# Patient Record
Sex: Male | Born: 1940
Health system: Southern US, Community
[De-identification: ages and names within clinical notes are randomized; demographics above are authoritative.]

## PROBLEM LIST (undated history)

## (undated) DIAGNOSIS — R0600 Dyspnea, unspecified: Secondary | ICD-10-CM

## (undated) DIAGNOSIS — I2510 Atherosclerotic heart disease of native coronary artery without angina pectoris: Secondary | ICD-10-CM

## (undated) DIAGNOSIS — J310 Chronic rhinitis: Secondary | ICD-10-CM

## (undated) DIAGNOSIS — E785 Hyperlipidemia, unspecified: Secondary | ICD-10-CM

## (undated) DIAGNOSIS — E669 Obesity, unspecified: Secondary | ICD-10-CM

## (undated) DIAGNOSIS — I351 Nonrheumatic aortic (valve) insufficiency: Secondary | ICD-10-CM

## (undated) DIAGNOSIS — K648 Other hemorrhoids: Secondary | ICD-10-CM

## (undated) DIAGNOSIS — Z87442 Personal history of urinary calculi: Secondary | ICD-10-CM

## (undated) DIAGNOSIS — IMO0002 Reserved for concepts with insufficient information to code with codable children: Secondary | ICD-10-CM

## (undated) DIAGNOSIS — L989 Disorder of the skin and subcutaneous tissue, unspecified: Secondary | ICD-10-CM

## (undated) DIAGNOSIS — G4733 Obstructive sleep apnea (adult) (pediatric): Secondary | ICD-10-CM

## (undated) DIAGNOSIS — J329 Chronic sinusitis, unspecified: Secondary | ICD-10-CM

## (undated) DIAGNOSIS — R609 Edema, unspecified: Secondary | ICD-10-CM

## (undated) DIAGNOSIS — L309 Dermatitis, unspecified: Secondary | ICD-10-CM

## (undated) DIAGNOSIS — I272 Pulmonary hypertension, unspecified: Secondary | ICD-10-CM

## (undated) DIAGNOSIS — I10 Essential (primary) hypertension: Secondary | ICD-10-CM

## (undated) DIAGNOSIS — I5189 Other ill-defined heart diseases: Secondary | ICD-10-CM

## (undated) DIAGNOSIS — I071 Rheumatic tricuspid insufficiency: Secondary | ICD-10-CM

## (undated) DIAGNOSIS — L0390 Cellulitis, unspecified: Secondary | ICD-10-CM

## (undated) DIAGNOSIS — Z86010 Personal history of colonic polyps: Secondary | ICD-10-CM

## (undated) DIAGNOSIS — K5790 Diverticulosis of intestine, part unspecified, without perforation or abscess without bleeding: Secondary | ICD-10-CM

## (undated) DIAGNOSIS — K760 Fatty (change of) liver, not elsewhere classified: Secondary | ICD-10-CM

## (undated) DIAGNOSIS — M1990 Unspecified osteoarthritis, unspecified site: Secondary | ICD-10-CM

## (undated) DIAGNOSIS — R0609 Other forms of dyspnea: Secondary | ICD-10-CM

## (undated) HISTORY — DX: Personal history of colonic polyps: Z86.010

## (undated) HISTORY — DX: Obesity, unspecified: E66.9

## (undated) HISTORY — DX: Disorder of the skin and subcutaneous tissue, unspecified: L98.9

## (undated) HISTORY — DX: Edema, unspecified: R60.9

## (undated) HISTORY — DX: Obstructive sleep apnea (adult) (pediatric): G47.33

## (undated) HISTORY — DX: Unspecified osteoarthritis, unspecified site: M19.90

## (undated) HISTORY — DX: Reserved for concepts with insufficient information to code with codable children: IMO0002

## (undated) HISTORY — DX: Chronic rhinitis: J31.0

## (undated) HISTORY — DX: Chronic sinusitis, unspecified: J32.9

## (undated) HISTORY — PX: POLYPECTOMY: SHX149

## (undated) HISTORY — DX: Essential (primary) hypertension: I10

## (undated) HISTORY — PX: COLONOSCOPY: SHX174

## (undated) HISTORY — DX: Dermatitis, unspecified: L30.9

## (undated) HISTORY — DX: Atherosclerotic heart disease of native coronary artery without angina pectoris: I25.10

## (undated) HISTORY — DX: Hyperlipidemia, unspecified: E78.5

## (undated) HISTORY — DX: Cellulitis, unspecified: L03.90

## (undated) HISTORY — PX: CARDIAC CATHETERIZATION: SHX172

## (undated) HISTORY — DX: Personal history of urinary calculi: Z87.442

## (undated) HISTORY — PX: CORONARY STENT PLACEMENT: SHX1402

## (undated) HISTORY — PX: TONSILLECTOMY: SUR1361

---

## 2001-10-14 ENCOUNTER — Emergency Department (HOSPITAL_COMMUNITY): Admission: EM | Admit: 2001-10-14 | Discharge: 2001-10-14 | Payer: Self-pay | Admitting: Emergency Medicine

## 2001-10-14 ENCOUNTER — Encounter: Payer: Self-pay | Admitting: Emergency Medicine

## 2002-05-23 ENCOUNTER — Encounter: Payer: Self-pay | Admitting: Emergency Medicine

## 2002-05-23 ENCOUNTER — Emergency Department (HOSPITAL_COMMUNITY): Admission: EM | Admit: 2002-05-23 | Discharge: 2002-05-23 | Payer: Self-pay | Admitting: Emergency Medicine

## 2002-05-23 DIAGNOSIS — Z87442 Personal history of urinary calculi: Secondary | ICD-10-CM

## 2002-05-23 HISTORY — DX: Personal history of urinary calculi: Z87.442

## 2003-06-14 ENCOUNTER — Encounter: Payer: Self-pay | Admitting: Cardiology

## 2003-06-14 ENCOUNTER — Ambulatory Visit (HOSPITAL_COMMUNITY): Admission: RE | Admit: 2003-06-14 | Discharge: 2003-06-15 | Payer: Self-pay | Admitting: Cardiology

## 2004-09-05 ENCOUNTER — Ambulatory Visit: Payer: Self-pay | Admitting: Internal Medicine

## 2004-09-10 ENCOUNTER — Ambulatory Visit: Payer: Self-pay | Admitting: Internal Medicine

## 2004-09-18 ENCOUNTER — Ambulatory Visit (HOSPITAL_COMMUNITY): Admission: RE | Admit: 2004-09-18 | Discharge: 2004-09-18 | Payer: Self-pay | Admitting: Internal Medicine

## 2004-09-18 DIAGNOSIS — K760 Fatty (change of) liver, not elsewhere classified: Secondary | ICD-10-CM

## 2004-09-18 HISTORY — DX: Fatty (change of) liver, not elsewhere classified: K76.0

## 2004-09-24 ENCOUNTER — Ambulatory Visit: Payer: Self-pay | Admitting: Pulmonary Disease

## 2004-10-01 ENCOUNTER — Ambulatory Visit (HOSPITAL_BASED_OUTPATIENT_CLINIC_OR_DEPARTMENT_OTHER): Admission: RE | Admit: 2004-10-01 | Discharge: 2004-10-01 | Payer: Self-pay | Admitting: Pulmonary Disease

## 2004-10-01 ENCOUNTER — Ambulatory Visit: Payer: Self-pay | Admitting: Pulmonary Disease

## 2004-10-23 ENCOUNTER — Ambulatory Visit: Payer: Self-pay | Admitting: Internal Medicine

## 2004-11-04 ENCOUNTER — Ambulatory Visit: Payer: Self-pay | Admitting: Internal Medicine

## 2004-11-14 ENCOUNTER — Ambulatory Visit: Payer: Self-pay | Admitting: Pulmonary Disease

## 2004-11-23 ENCOUNTER — Emergency Department (HOSPITAL_COMMUNITY): Admission: EM | Admit: 2004-11-23 | Discharge: 2004-11-23 | Payer: Self-pay | Admitting: Emergency Medicine

## 2004-12-04 ENCOUNTER — Emergency Department (HOSPITAL_COMMUNITY): Admission: EM | Admit: 2004-12-04 | Discharge: 2004-12-04 | Payer: Self-pay | Admitting: Emergency Medicine

## 2004-12-20 ENCOUNTER — Ambulatory Visit: Payer: Self-pay | Admitting: Pulmonary Disease

## 2004-12-23 ENCOUNTER — Ambulatory Visit: Payer: Self-pay | Admitting: Cardiology

## 2004-12-24 ENCOUNTER — Ambulatory Visit (HOSPITAL_BASED_OUTPATIENT_CLINIC_OR_DEPARTMENT_OTHER): Admission: RE | Admit: 2004-12-24 | Discharge: 2004-12-24 | Payer: Self-pay | Admitting: Pulmonary Disease

## 2004-12-31 ENCOUNTER — Ambulatory Visit: Payer: Self-pay | Admitting: Pulmonary Disease

## 2005-10-20 ENCOUNTER — Ambulatory Visit: Payer: Self-pay | Admitting: Internal Medicine

## 2005-10-28 ENCOUNTER — Ambulatory Visit: Payer: Self-pay | Admitting: Internal Medicine

## 2005-10-28 LAB — CONVERTED CEMR LAB: PSA: 1.59 ng/mL

## 2005-11-25 ENCOUNTER — Ambulatory Visit: Payer: Self-pay | Admitting: Internal Medicine

## 2006-06-01 ENCOUNTER — Ambulatory Visit: Payer: Self-pay | Admitting: Internal Medicine

## 2006-06-15 ENCOUNTER — Ambulatory Visit: Payer: Self-pay | Admitting: Internal Medicine

## 2006-10-30 ENCOUNTER — Ambulatory Visit: Payer: Self-pay | Admitting: Cardiology

## 2007-03-23 ENCOUNTER — Encounter: Payer: Self-pay | Admitting: Internal Medicine

## 2007-03-23 DIAGNOSIS — Z860100 Personal history of colon polyps, unspecified: Secondary | ICD-10-CM | POA: Insufficient documentation

## 2007-03-23 DIAGNOSIS — E785 Hyperlipidemia, unspecified: Secondary | ICD-10-CM

## 2007-03-23 DIAGNOSIS — E1165 Type 2 diabetes mellitus with hyperglycemia: Secondary | ICD-10-CM | POA: Insufficient documentation

## 2007-03-23 DIAGNOSIS — I10 Essential (primary) hypertension: Secondary | ICD-10-CM | POA: Insufficient documentation

## 2007-07-20 ENCOUNTER — Encounter: Payer: Self-pay | Admitting: Internal Medicine

## 2007-09-07 ENCOUNTER — Encounter: Payer: Self-pay | Admitting: Internal Medicine

## 2007-09-23 ENCOUNTER — Ambulatory Visit: Payer: Self-pay | Admitting: Internal Medicine

## 2007-09-23 DIAGNOSIS — L989 Disorder of the skin and subcutaneous tissue, unspecified: Secondary | ICD-10-CM | POA: Insufficient documentation

## 2007-09-23 DIAGNOSIS — H8110 Benign paroxysmal vertigo, unspecified ear: Secondary | ICD-10-CM | POA: Insufficient documentation

## 2007-09-28 ENCOUNTER — Encounter: Payer: Self-pay | Admitting: Internal Medicine

## 2007-10-01 ENCOUNTER — Ambulatory Visit: Payer: Self-pay | Admitting: Internal Medicine

## 2007-10-01 LAB — CONVERTED CEMR LAB
ALT: 88 units/L — ABNORMAL HIGH (ref 0–53)
AST: 52 units/L — ABNORMAL HIGH (ref 0–37)
BUN: 15 mg/dL (ref 6–23)
CO2: 30 meq/L (ref 19–32)
Calcium: 9.1 mg/dL (ref 8.4–10.5)
Chloride: 105 meq/L (ref 96–112)
Cholesterol: 111 mg/dL (ref 0–200)
Creatinine, Ser: 0.9 mg/dL (ref 0.4–1.5)
Creatinine,U: 96.1 mg/dL
GFR calc Af Amer: 109 mL/min
GFR calc non Af Amer: 90 mL/min
Glucose, Bld: 133 mg/dL — ABNORMAL HIGH (ref 70–99)
HDL: 29.6 mg/dL — ABNORMAL LOW (ref >39.0)
Hgb A1c MFr Bld: 6.9 % — ABNORMAL HIGH (ref 4.6–6.0)
LDL Cholesterol: 60 mg/dL (ref 0–99)
Microalb Creat Ratio: 20.8 mg/g (ref 0.0–30.0)
Microalb, Ur: 2 mg/dL — ABNORMAL HIGH (ref 0.0–1.9)
Potassium: 4.5 meq/L (ref 3.5–5.1)
Sodium: 141 meq/L (ref 135–145)
TSH: 1.49 microintl units/mL (ref 0.35–5.50)
Total CHOL/HDL Ratio: 3.8
Triglycerides: 107 mg/dL (ref 0–149)
VLDL: 21 mg/dL (ref 0–40)

## 2007-10-04 ENCOUNTER — Encounter: Payer: Self-pay | Admitting: Internal Medicine

## 2007-10-05 ENCOUNTER — Encounter: Payer: Self-pay | Admitting: Internal Medicine

## 2007-10-05 ENCOUNTER — Encounter: Admission: RE | Admit: 2007-10-05 | Discharge: 2007-10-19 | Payer: Self-pay | Admitting: Internal Medicine

## 2007-10-26 ENCOUNTER — Ambulatory Visit: Payer: Self-pay | Admitting: Cardiology

## 2007-10-29 ENCOUNTER — Encounter: Payer: Self-pay | Admitting: Internal Medicine

## 2007-11-18 ENCOUNTER — Ambulatory Visit: Payer: Self-pay | Admitting: Internal Medicine

## 2007-12-31 ENCOUNTER — Ambulatory Visit: Payer: Self-pay | Admitting: Internal Medicine

## 2007-12-31 LAB — CONVERTED CEMR LAB
Creatinine, Ser: 1.1 mg/dL (ref 0.4–1.5)
Potassium: 4.3 meq/L (ref 3.5–5.1)

## 2008-01-03 ENCOUNTER — Telehealth: Payer: Self-pay | Admitting: Internal Medicine

## 2008-03-07 ENCOUNTER — Ambulatory Visit: Payer: Self-pay | Admitting: Internal Medicine

## 2008-03-14 ENCOUNTER — Telehealth: Payer: Self-pay | Admitting: Internal Medicine

## 2008-05-09 ENCOUNTER — Ambulatory Visit: Payer: Self-pay | Admitting: Internal Medicine

## 2008-05-09 DIAGNOSIS — J018 Other acute sinusitis: Secondary | ICD-10-CM | POA: Insufficient documentation

## 2008-05-09 DIAGNOSIS — L259 Unspecified contact dermatitis, unspecified cause: Secondary | ICD-10-CM | POA: Insufficient documentation

## 2008-05-12 ENCOUNTER — Telehealth: Payer: Self-pay | Admitting: Internal Medicine

## 2008-05-17 ENCOUNTER — Ambulatory Visit: Payer: Self-pay | Admitting: Internal Medicine

## 2008-05-17 LAB — CONVERTED CEMR LAB
ALT: 68 units/L — ABNORMAL HIGH (ref 0–53)
AST: 49 units/L — ABNORMAL HIGH (ref 0–37)
BUN: 14 mg/dL (ref 6–23)
CO2: 30 meq/L (ref 19–32)
Calcium: 8.9 mg/dL (ref 8.4–10.5)
Chloride: 110 meq/L (ref 96–112)
Cholesterol: 113 mg/dL (ref 0–200)
Creatinine, Ser: 0.7 mg/dL (ref 0.4–1.5)
Creatinine,U: 108.9 mg/dL
GFR calc Af Amer: 146 mL/min
GFR calc non Af Amer: 121 mL/min
Glucose, Bld: 147 mg/dL — ABNORMAL HIGH (ref 70–99)
HDL: 27.5 mg/dL — ABNORMAL LOW (ref >39.0)
Hgb A1c MFr Bld: 7.2 % — ABNORMAL HIGH (ref 4.6–6.0)
LDL Cholesterol: 56 mg/dL (ref 0–99)
Microalb Creat Ratio: 5.5 mg/g (ref 0.0–30.0)
Microalb, Ur: 0.6 mg/dL (ref 0.0–1.9)
Potassium: 4.2 meq/L (ref 3.5–5.1)
Sodium: 142 meq/L (ref 135–145)
Total CHOL/HDL Ratio: 4.1
Triglycerides: 150 mg/dL — ABNORMAL HIGH (ref 0–149)
VLDL: 30 mg/dL (ref 0–40)

## 2008-05-23 ENCOUNTER — Ambulatory Visit: Payer: Self-pay | Admitting: Internal Medicine

## 2008-05-23 DIAGNOSIS — L0291 Cutaneous abscess, unspecified: Secondary | ICD-10-CM

## 2008-05-23 DIAGNOSIS — L0390 Cellulitis, unspecified: Secondary | ICD-10-CM

## 2008-06-08 ENCOUNTER — Encounter: Payer: Self-pay | Admitting: Internal Medicine

## 2008-06-29 ENCOUNTER — Ambulatory Visit: Payer: Self-pay | Admitting: Internal Medicine

## 2008-07-31 ENCOUNTER — Encounter: Payer: Self-pay | Admitting: Internal Medicine

## 2008-08-10 ENCOUNTER — Encounter: Payer: Self-pay | Admitting: Internal Medicine

## 2008-08-11 ENCOUNTER — Encounter: Payer: Self-pay | Admitting: Internal Medicine

## 2008-08-28 ENCOUNTER — Encounter: Payer: Self-pay | Admitting: Internal Medicine

## 2008-09-26 ENCOUNTER — Encounter: Payer: Self-pay | Admitting: Internal Medicine

## 2008-10-27 ENCOUNTER — Ambulatory Visit: Payer: Self-pay | Admitting: Cardiology

## 2008-10-27 LAB — CONVERTED CEMR LAB: TSH: 1.96 microintl units/mL (ref 0.35–5.50)

## 2008-11-01 ENCOUNTER — Ambulatory Visit: Payer: Self-pay | Admitting: Cardiology

## 2008-11-07 ENCOUNTER — Encounter: Payer: Self-pay | Admitting: Internal Medicine

## 2008-12-21 ENCOUNTER — Ambulatory Visit: Payer: Self-pay | Admitting: Internal Medicine

## 2009-01-03 ENCOUNTER — Encounter: Payer: Self-pay | Admitting: Internal Medicine

## 2009-02-15 ENCOUNTER — Ambulatory Visit: Payer: Self-pay | Admitting: Internal Medicine

## 2009-02-15 LAB — CONVERTED CEMR LAB
BUN: 16 mg/dL (ref 6–23)
Chloride: 106 meq/L (ref 96–112)
Cholesterol: 137 mg/dL (ref 0–200)
Glucose, Bld: 115 mg/dL — ABNORMAL HIGH (ref 70–99)
HDL: 30.6 mg/dL — ABNORMAL LOW (ref >39.00)
Microalb Creat Ratio: 5.4 mg/g (ref 0.0–30.0)
Potassium: 4.2 meq/L (ref 3.5–5.1)
Triglycerides: 116 mg/dL (ref 0.0–149.0)
VLDL: 23.2 mg/dL (ref 0.0–40.0)

## 2009-02-22 ENCOUNTER — Ambulatory Visit: Payer: Self-pay | Admitting: Internal Medicine

## 2009-02-27 ENCOUNTER — Telehealth: Payer: Self-pay | Admitting: Internal Medicine

## 2009-03-16 ENCOUNTER — Telehealth: Payer: Self-pay | Admitting: Internal Medicine

## 2009-03-21 ENCOUNTER — Telehealth: Payer: Self-pay | Admitting: Internal Medicine

## 2009-04-17 ENCOUNTER — Telehealth: Payer: Self-pay | Admitting: Internal Medicine

## 2009-05-03 ENCOUNTER — Ambulatory Visit: Payer: Self-pay | Admitting: Internal Medicine

## 2009-05-03 DIAGNOSIS — G4733 Obstructive sleep apnea (adult) (pediatric): Secondary | ICD-10-CM

## 2009-07-24 ENCOUNTER — Telehealth: Payer: Self-pay | Admitting: Internal Medicine

## 2009-07-27 ENCOUNTER — Ambulatory Visit: Payer: Self-pay | Admitting: Internal Medicine

## 2009-07-27 LAB — CONVERTED CEMR LAB
ALT: 76 units/L — ABNORMAL HIGH (ref 0–53)
AST: 46 units/L — ABNORMAL HIGH (ref 0–37)
CO2: 32 meq/L (ref 19–32)
Calcium: 8.9 mg/dL (ref 8.4–10.5)
Chloride: 104 meq/L (ref 96–112)
Hgb A1c MFr Bld: 6.4 % (ref 4.6–6.5)
Sodium: 142 meq/L (ref 135–145)

## 2009-08-03 ENCOUNTER — Ambulatory Visit: Payer: Self-pay | Admitting: Internal Medicine

## 2009-08-03 DIAGNOSIS — R609 Edema, unspecified: Secondary | ICD-10-CM

## 2009-08-16 ENCOUNTER — Telehealth: Payer: Self-pay | Admitting: Family

## 2009-09-21 ENCOUNTER — Ambulatory Visit: Payer: Self-pay | Admitting: Internal Medicine

## 2009-09-25 ENCOUNTER — Telehealth: Payer: Self-pay | Admitting: Internal Medicine

## 2009-09-26 ENCOUNTER — Ambulatory Visit: Payer: Self-pay | Admitting: Internal Medicine

## 2009-09-26 LAB — CONVERTED CEMR LAB
Chloride: 104 meq/L (ref 96–112)
Creatinine, Ser: 1.1 mg/dL (ref 0.4–1.5)
GFR calc non Af Amer: 71.15 mL/min (ref >60)
Potassium: 4.2 meq/L (ref 3.5–5.1)
Pro B Natriuretic peptide (BNP): 12 pg/mL (ref 0.0–100.0)

## 2009-10-05 ENCOUNTER — Ambulatory Visit: Payer: Self-pay | Admitting: Internal Medicine

## 2009-10-22 DIAGNOSIS — I2510 Atherosclerotic heart disease of native coronary artery without angina pectoris: Secondary | ICD-10-CM | POA: Insufficient documentation

## 2009-10-26 ENCOUNTER — Ambulatory Visit: Payer: Self-pay | Admitting: Cardiology

## 2009-12-17 ENCOUNTER — Telehealth: Payer: Self-pay | Admitting: Internal Medicine

## 2010-01-25 ENCOUNTER — Ambulatory Visit: Payer: Self-pay | Admitting: Internal Medicine

## 2010-01-28 LAB — CONVERTED CEMR LAB
ALT: 92 units/L — ABNORMAL HIGH (ref 0–53)
AST: 69 units/L — ABNORMAL HIGH (ref 0–37)
CO2: 28 meq/L (ref 19–32)
Calcium: 8.6 mg/dL (ref 8.4–10.5)
Creatinine,U: 221.3 mg/dL
GFR calc non Af Amer: 132.79 mL/min (ref >60)
Hgb A1c MFr Bld: 6.8 % — ABNORMAL HIGH (ref 4.6–6.5)
Microalb, Ur: 1.3 mg/dL (ref 0.0–1.9)
Sodium: 143 meq/L (ref 135–145)
Total CHOL/HDL Ratio: 5
Triglycerides: 127 mg/dL (ref 0.0–149.0)

## 2010-02-01 ENCOUNTER — Ambulatory Visit: Payer: Self-pay | Admitting: Internal Medicine

## 2010-04-08 ENCOUNTER — Encounter: Payer: Self-pay | Admitting: Internal Medicine

## 2010-05-28 ENCOUNTER — Telehealth: Payer: Self-pay | Admitting: Internal Medicine

## 2010-05-29 ENCOUNTER — Ambulatory Visit: Payer: Self-pay | Admitting: Internal Medicine

## 2010-05-29 LAB — CONVERTED CEMR LAB
ALT: 90 units/L — ABNORMAL HIGH (ref 0–53)
AST: 56 units/L — ABNORMAL HIGH (ref 0–37)
Calcium: 9.1 mg/dL (ref 8.4–10.5)
GFR calc non Af Amer: 123.69 mL/min (ref >60)
Hgb A1c MFr Bld: 7.5 % — ABNORMAL HIGH (ref 4.6–6.5)
Potassium: 4.8 meq/L (ref 3.5–5.1)
Sodium: 144 meq/L (ref 135–145)

## 2010-06-04 ENCOUNTER — Ambulatory Visit: Payer: Self-pay | Admitting: Internal Medicine

## 2010-06-20 ENCOUNTER — Telehealth: Payer: Self-pay | Admitting: Internal Medicine

## 2010-07-30 ENCOUNTER — Telehealth: Payer: Self-pay | Admitting: Internal Medicine

## 2010-07-30 ENCOUNTER — Ambulatory Visit: Payer: Self-pay | Admitting: Internal Medicine

## 2010-07-30 LAB — CONVERTED CEMR LAB
Alkaline Phosphatase: 59 units/L (ref 39–117)
Bilirubin, Direct: 0.1 mg/dL (ref 0.0–0.3)
Calcium: 9 mg/dL (ref 8.4–10.5)
Creatinine, Ser: 0.7 mg/dL (ref 0.4–1.5)
GFR calc non Af Amer: 127.96 mL/min (ref >60.00)
Sodium: 143 meq/L (ref 135–145)

## 2010-08-06 ENCOUNTER — Ambulatory Visit: Payer: Self-pay | Admitting: Internal Medicine

## 2010-08-23 ENCOUNTER — Encounter: Payer: Self-pay | Admitting: Internal Medicine

## 2010-08-28 ENCOUNTER — Telehealth: Payer: Self-pay | Admitting: Internal Medicine

## 2010-09-26 ENCOUNTER — Encounter: Payer: Self-pay | Admitting: Internal Medicine

## 2010-09-26 NOTE — Progress Notes (Signed)
Summary: Rx Status  Phone Note Outgoing Call   Call placed by: Glendell Docker CMA,  December 17, 2009 8:53 AM Call placed to: Patient Summary of Call: call placed to patient at [phone] regarding rx refill request, no answer, voice message left for patient to return call Initial call taken by: Glendell Docker CMA,  December 17, 2009 8:53 AM  Follow-up for Phone Call        call placed to patient at [phone], no answer, routed through privacy director, unable to leave message  Follow-up by: Glendell Docker CMA,  December 18, 2009 12:19 PM  Additional Follow-up for Phone Call Additional follow up Details #1::        unable to reach patient,msg left with pharmacy to have patient contact office regarding rx Additional Follow-up by: Glendell Docker CMA,  December 19, 2009 8:12 AM

## 2010-09-26 NOTE — Assessment & Plan Note (Signed)
Summary: 2 MONTH FOLLO WUP/MHF   Vital Signs:  Patient profile:   70 year old male Weight:      308.50 pounds BMI:     41.99 O2 Sat:      94 % on Room air Temp:     97.8 degrees F oral Pulse rate:   74 / minute Pulse rhythm:   regular Resp:     22 per minute BP sitting:   110 / 70  (right arm) Cuff size:   large  Vitals Entered By: Glendell Docker CMA (October 05, 2009 4:04 PM)  O2 Flow:  Room air  Primary Care Provider:  D. Thomos Lemons DO  CC:  2 Month Follow up.  History of Present Illness: 2 Month follow up  70 y/o white male for follow up  cough is better but he has unresolved sinus drainage  DM II using byetta - he feels like Alma Friendly was better at curbing his appetite low blood sugar 130 high 150 avg 140's  poor dietary compliance  LE swelling - about the same.  BNP is normal.  no exercise  Allergies (verified): No Known Drug Allergies  Past History:  Past Medical History: Colonic polyps, hx of Coronary artery disease Diabetes mellitus, type II Hyperlipidemia    Hypertension   Postional Vertigo  History of thermal injury to right upper back with chronic scar tissue.    Past Surgical History: Cardiac Catherization       Family History: Father and sister with heart disease Mother - cancer          Social History: Occupation: Musician business Married Former Smoker            Physical Exam  General:  alert and overweight-appearing.   Lungs:  normal respiratory effort and normal breath sounds.   Heart:  normal rate, regular rhythm, and no gallop.   Extremities:  1+ left pedal edema and 1+ right pedal edema.     Impression & Recommendations:  Problem # 1:  DIABETES MELLITUS, TYPE II (ICD-250.00) some wt gain since prev visit.  he feels byetta now as helpful and [place].  he can not tolerate regular metformin due to diarrhea.    switch to [place] and glumetza.  pt encouraged to start walking program  The following medications were removed  from the medication list:    Byetta 10 Mcg Pen 10 Mcg/0.75ml Soln (Exenatide) ..... Inject subcutaneously two times a day His updated medication list for this problem includes:    Aspirin Low Dose 81 Mg Tabs (Aspirin) .Marland Kitchen... Take 1 tablet by mouth once a day    Januvia 100 Mg Tabs (Sitagliptin phosphate) ..... One by mouth once daily    Glumetza 500 Mg Xr24h-tab (Metformin hcl) ..... One by mouth bid  Labs Reviewed: Creat: 1.1 (09/26/2009)    Reviewed HgBA1c results: 6.4 (07/27/2009)  7.1 (02/15/2009)  Problem # 2:  RHINOSINUSITIS, ACUTE (ICD-461.8) Assessment: Improved mild residual cough.  continue nasal saline irrigation The following medications were removed from the medication list:    Cefdinir 300 Mg Caps (Cefdinir) ..... One by mouth two times a day His updated medication list for this problem includes:    Nasonex 50 Mcg/act Susp (Mometasone furoate) .Marland Kitchen... 2 sprays each nostril once daily  Problem # 3:  EDEMA (ICD-782.3) BNP.  symptoms secondary to obesity and venous insuff.  start walking program  Complete Medication List: 1)  Aspirin Low Dose 81 Mg Tabs (Aspirin) .... Take 1 tablet by mouth once  a day 2)  Simvastatin 40 Mg Tabs (Simvastatin) .... One by mouth qpm 3)  Fish Oil 1000 Mg Caps (Omega-3 fatty acids) .... Take 1 tablet by mouth once a day 4)  Nasonex 50 Mcg/act Susp (Mometasone furoate) .... 2 sprays each nostril once daily 5)  Bd Pen Needle Short U/f 31g X 8 Mm Misc (Insulin pen needle) .... Use twice daily for insulin injection 6)  Freestyle Lite Test Strp (Glucose blood) .... Test once daily 7)  Amlodipine Besylate 5 Mg Tabs (Amlodipine besylate) .... One by mouth once daily 8)  Januvia 100 Mg Tabs (Sitagliptin phosphate) .... One by mouth once daily 9)  Glumetza 500 Mg Xr24h-tab (Metformin hcl) .... One by mouth bid  Patient Instructions: 1)  Please schedule a follow-up appointment in 4 months. 2)  BMP prior to visit, ICD-9: 401.9 3)  HbgA1C prior to visit,  ICD-9: 250.00 4)  Urine Microalbumin prior to visit, ICD-9: 250.00 5)  FLP, AST, ALT  prior to visit, ICD-9: 272.4 6)  Please return for lab work one (1) week before your next appointment.  Prescriptions: GLUMETZA 500 MG XR24H-TAB (METFORMIN HCL) one by mouth bid  #60 x 3   Entered and Authorized by:   D. Thomos Lemons DO   Signed by:   D. Thomos Lemons DO on 10/05/2009   Method used:   Electronically to        [id number]* (retail)       [address]       [place], [place]  [address]       Ph: [phone]       Fax: [phone]   RxID:   [id number] JANUVIA 100 MG TABS (SITAGLIPTIN PHOSPHATE) one by mouth once daily  #30 x 5   Entered and Authorized by:   D. Thomos Lemons DO   Signed by:   D. Thomos Lemons DO on 10/05/2009   Method used:   Electronically to        [facility] [address]* (retail)       [address]       [place], [place]  [address]       Ph: [phone]       Fax: [phone]   RxID:   [id number]   Current Allergies (reviewed today): No known allergies

## 2010-09-26 NOTE — Letter (Signed)
Summary: [facility]   Imported By: Lanelle Bal 09/10/2010 09:43:56  _____________________________________________________________________  External Attachment:    Type:   Image     Comment:   External Document

## 2010-09-26 NOTE — Letter (Signed)
Summary: [facility]  [facility]   Imported By: Lanelle Bal 04/17/2010 10:09:04  _____________________________________________________________________  External Attachment:    Type:   Image     Comment:   External Document

## 2010-09-26 NOTE — Assessment & Plan Note (Signed)
Summary: 2 month fu/dt   Vital Signs:  Patient profile:   70 year old male Height:      72 inches Weight:      294.50 pounds BMI:     40.09 O2 Sat:      97 % on Room air Temp:     97.6 degrees F oral Pulse rate:   68 / minute Resp:     18 per minute BP sitting:   104 / 60  (left arm) Cuff size:   large  Vitals Entered By: Glendell Docker CMA (August 06, 2010 3:21 PM)  O2 Flow:  Room air CC: 2 Month follow up, Type 2 diabetes mellitus follow-up Is Patient Diabetic? Yes Did you bring your meter with you today? No Pain Assessment Patient in pain? no      Comments low blood sugar 121, high 135 avg unknown   Primary Care Provider:  Dondra Spry DO  CC:  2 Month follow up and Type 2 diabetes mellitus follow-up.  History of Present Illness:  Type 2 Diabetes Mellitus Follow-Up      This is a 70 year old man who presents for Type 2 diabetes mellitus follow-up.  The patient denies self managed hypoglycemia, hypoglycemia requiring help, and weight gain.  The patient denies the following symptoms: chest pain.  Since the last visit the patient reports good dietary compliance and monitoring blood glucose.  pt able to lose some wt mainly with dietary change.  feeling better  Preventive Screening-Counseling & Management  Alcohol-Tobacco     Smoking Status: quit  Allergies (verified): No Known Drug Allergies  Past History:  Past Medical History: CAD, NATIVE VESSEL (ICD-414.01) HYPERLIPIDEMIA (ICD-272.4) HYPERTENSION (ICD-401.9) EDEMA (ICD-782.3)  OBESITY, HX OF (ICD-V13.8)  SLEEP APNEA, OBSTRUCTIVE (ICD-327.23) DIABETES MELLITUS, TYPE II (ICD-250.00) CELLULITIS (ICD-682.9) DERMATITIS (ICD-692.9) RHINOSINUSITIS, ACUTE (ICD-461.8) SKIN LESION (ICD-709.9)  POSITIONAL VERTIGO (ICD-386.11) COLONIC POLYPS, HX OF (ICD-V12.72)  Past Surgical History: Cardiac Catherization         Family History: Father and sister with heart disease Mother - cancer             Social  History: Occupation: Sports administrator  Married Former Smoker     Grew up in [place] Mother was [place] Father side of family [place]   Physical Exam  General:  alert, well-developed, and well-nourished.   Neck:  supple and no masses.  no carotid bruits.   Lungs:  normal respiratory effort and normal breath sounds.   Heart:  normal rate, regular rhythm, no murmur, and no gallop.   Extremities:  trace left pedal edema and trace right pedal edema.     Impression & Recommendations:  Problem # 1:  HYPERLIPIDEMIA (ICD-272.4) Assessment Unchanged  His updated medication list for this problem includes:    Simvastatin 20 Mg Tabs (Simvastatin) ..... One by mouth once daily  Labs Reviewed: SGOT: 45 (07/30/2010)   SGPT: 70 (07/30/2010)   HDL:31.30 (01/28/2010), 30.60 (02/15/2009)  LDL:91 (01/28/2010), 83 (02/15/2009)  Chol:148 (01/28/2010), 137 (02/15/2009)  Trig:127.0 (01/28/2010), 116.0 (02/15/2009)  Problem # 2:  DIABETES MELLITUS, TYPE II (ICD-250.00) Assessment: Improved  His updated medication list for this problem includes:    Aspirin Low Dose 81 Mg Tabs (Aspirin) .Marland Kitchen... Take 1 tablet by mouth once a day    Onglyza 5 Mg Tabs (Saxagliptin hcl) .Marland Kitchen... Take 1 tablet by mouth once a day  Labs Reviewed: Creat: 0.7 (07/30/2010)    Reviewed HgBA1c results: 6.6 (07/30/2010)  7.5 (05/29/2010)  Complete Medication  List: 1)  Aspirin Low Dose 81 Mg Tabs (Aspirin) .... Take 1 tablet by mouth once a day 2)  Simvastatin 20 Mg Tabs (Simvastatin) .... One by mouth once daily 3)  Fish Oil 1000 Mg Caps (Omega-3 fatty acids) .... Take 1 tablet by mouth once a day 4)  Nasonex 50 Mcg/act Susp (Mometasone furoate) .... 2 sprays each nostril once daily 5)  Bd Pen Needle Short U/f 31g X 8 Mm Misc (Insulin pen needle) .... Use twice daily for insulin injection 6)  Freestyle Lite Test Strp (Glucose blood) .... Test once daily 7)  Amlodipine Besylate 5 Mg Tabs (Amlodipine besylate) .... One by mouth  once daily 8)  Onglyza 5 Mg Tabs (Saxagliptin hcl) .... Take 1 tablet by mouth once a day  Patient Instructions: 1)  Please schedule a follow-up appointment in 4 months. 2)  BMP prior to visit, ICD-9:  401.9 3)  Lipid Panel prior to visit, ICD-9:  272.4 4)  HbgA1C prior to visit, ICD-9: 250.00 5)  Please return for lab work one (1) week before your next appointment.  Prescriptions: ONGLYZA 5 MG TABS (SAXAGLIPTIN HCL) Take 1 tablet by mouth once a day  #30 x 5   Entered and Authorized by:   D. Thomos Lemons DO   Signed by:   D. Thomos Lemons DO on 08/06/2010   Method used:   Electronically to        [id number]* (retail)       [address]       [place], [place]  [address]       Ph: [phone]       Fax: [phone]   RxID:   [id number] SIMVASTATIN 20 MG TABS (SIMVASTATIN) one by mouth once daily  #90 x 1   Entered and Authorized by:   D. Thomos Lemons DO   Signed by:   D. Thomos Lemons DO on 08/06/2010   Method used:   Electronically to        [facility] [address]* (retail)       [address]       [place], [place]  [address]       Ph: [phone]       Fax: [phone]   RxID:   [id number]    Orders Added: 1)  Est. Patient Level III [[address]]    Current Allergies (reviewed today): No known allergies

## 2010-09-26 NOTE — Progress Notes (Signed)
Summary: Glimiperide Denial  Phone Note Refill Request Message from:  Fax from Pharmacy on September 25, 2009 1:05 PM  Refills Requested: Medication #1:  glimepiride 1 mg   Dosage confirmed as above?Dosage Confirmed   Brand Name Necessary? No   Supply Requested: 1 month   Last Refilled: 07/23/2009  Method Requested: Electronic Next Appointment Scheduled: 09-27-2009 [facility]  Initial call taken by: Roselle Locus,  September 25, 2009 1:06 PM  Follow-up for Phone Call        Rx denied because patient is no longer taking medication, pharmacist informed Follow-up by: Glendell Docker CMA,  September 25, 2009 4:30 PM

## 2010-09-26 NOTE — Progress Notes (Signed)
Summary: Simvastatin Rx  Phone Note Call from Patient Call back at [phone]   Caller: Patient Call For: D. Thomos Lemons DO Summary of Call: patient is wanting to know if he could get a rx for Simvastatin 40mg   tablets and cut the rx in half. He states that it would more cost effective for him. He would like to know if Dr Artist Pais would be wiling to provide a rx t o the [facility] [address] Initial call taken by: Glendell Docker CMA,  August 28, 2010 12:25 PM  Follow-up for Phone Call        higher dose of simvastatin will raise flag at pharm due to interaction with amlodipine. I suggest we keep same dose Follow-up by: D. Thomos Lemons DO,  August 28, 2010 12:34 PM  Additional Follow-up for Phone Call Additional follow up Details #1::        call was returned to patient at [phone], he has been advised per Dr Artist Pais instructions, and has verbalized understanding Additional Follow-up by: Glendell Docker CMA,  August 28, 2010 2:15 PM

## 2010-09-26 NOTE — Assessment & Plan Note (Signed)
Summary: 1 month follow up/mhf--Rm 3   Vital Signs:  Patient profile:   70 year old male Height:      72 inches Weight:      302 pounds BMI:     41.11 Temp:     98.0 degrees F oral Pulse rate:   66 / minute Pulse rhythm:   irregular Resp:     18 per minute BP sitting:   100 / 60  (right arm) Cuff size:   large  Vitals Entered By: Mervin Kung CMA (February 01, 2010 3:54 PM) CC: Room 3  Pt here for follow up.  High Bs--168  Low BS--140, Type 2 diabetes mellitus follow-up Is Patient Diabetic? Yes Comments Pt states ins. will not cover Glumetza.   Primary Care Provider:  Dondra Spry DO  CC:  Room 3  Pt here for follow up.  High Bs--168  Low BS--140 and Type 2 diabetes mellitus follow-up.  History of Present Illness:  Type 2 Diabetes Mellitus Follow-Up      This is a 70 year old man who presents for Type 2 diabetes mellitus follow-up.  The patient denies self managed hypoglycemia, hypoglycemia requiring help, and weight loss.  The patient denies the following symptoms: chest pain.  Since the last visit the patient reports poor dietary compliance, not exercising regularly, and monitoring blood glucose.    Allergies (verified): No Known Drug Allergies  Past History:  Past Medical History: CAD, NATIVE VESSEL (ICD-414.01) HYPERLIPIDEMIA (ICD-272.4) HYPERTENSION (ICD-401.9) EDEMA (ICD-782.3) OBESITY, HX OF (ICD-V13.8) SLEEP APNEA, OBSTRUCTIVE (ICD-327.23) DIABETES MELLITUS, TYPE II (ICD-250.00) CELLULITIS (ICD-682.9) DERMATITIS (ICD-692.9) RHINOSINUSITIS, ACUTE (ICD-461.8) SKIN LESION (ICD-709.9)  POSITIONAL VERTIGO (ICD-386.11) COLONIC POLYPS, HX OF (ICD-V12.72)  Past Surgical History: Cardiac Catherization        Family History: Father and sister with heart disease Mother - cancer           Social History: Occupation: Musician business Married Former Smoker             Physical Exam  General:  alert and overweight-appearing.   Lungs:  normal  respiratory effort and normal breath sounds.   Heart:  normal rate, regular rhythm, and no gallop.   Extremities:  trace left pedal edema and trace right pedal edema.   Neurologic:  cranial nerves II-XII intact and gait normal.     Impression & Recommendations:  Problem # 1:  DIABETES MELLITUS, TYPE II (ICD-250.00) Assessment Deteriorated He noticed higher CBG and difficulty with portion control since using januvia.  switch back to byetta.  The following medications were removed from the medication list:    Januvia 100 Mg Tabs (Sitagliptin phosphate) ..... One by mouth once daily His updated medication list for this problem includes:    Aspirin Low Dose 81 Mg Tabs (Aspirin) .Marland Kitchen... Take 1 tablet by mouth once a day    Byetta 10 Mcg Pen 10 Mcg/0.55ml Soln (Exenatide) ..... Use two times a day as directed  Labs Reviewed: Creat: 0.6 (01/28/2010)    Reviewed HgBA1c results: 6.8 (01/28/2010)  6.4 (07/27/2009)  Problem # 2:  HYPERTENSION (ICD-401.9)  His updated medication list for this problem includes:    Amlodipine Besylate 5 Mg Tabs (Amlodipine besylate) ..... One by mouth once daily  BP today: 100/60 Prior BP: 110/60 (10/26/2009)  Labs Reviewed: K+: 4.2 (01/28/2010) Creat: : 0.6 (01/28/2010)   Chol: 148 (01/28/2010)   HDL: 31.30 (01/28/2010)   LDL: 91 (01/28/2010)   TG: 127.0 (01/28/2010)  Complete Medication List: 1)  Aspirin Low Dose 81 Mg Tabs (Aspirin) .... Take 1 tablet by mouth once a day 2)  Simvastatin 40 Mg Tabs (Simvastatin) .... One by mouth qpm 3)  Fish Oil 1000 Mg Caps (Omega-3 fatty acids) .... Take 1 tablet by mouth once a day 4)  Nasonex 50 Mcg/act Susp (Mometasone furoate) .... 2 sprays each nostril once daily 5)  Bd Pen Needle Short U/f 31g X 8 Mm Misc (Insulin pen needle) .... Use twice daily for insulin injection 6)  Freestyle Lite Test Strp (Glucose blood) .... Test once daily 7)  Amlodipine Besylate 5 Mg Tabs (Amlodipine besylate) .... One by mouth once  daily 8)  Byetta 10 Mcg Pen 10 Mcg/0.57ml Soln (Exenatide) .... Use two times a day as directed  Patient Instructions: 1)  Please schedule a follow-up appointment in 4 months. 2)  BMP prior to visit, ICD-9: 250.00 3)  HbgA1C prior to visit, ICD-9: 250.00 4)  AST, ALT - 790.4 5)  Please return for lab work one (1) week before your next appointment.  Prescriptions: AMLODIPINE BESYLATE 5 MG TABS (AMLODIPINE BESYLATE) one by mouth once daily  #90 x 1   Entered and Authorized by:   D. Thomos Lemons DO   Signed by:   D. Thomos Lemons DO on 02/01/2010   Method used:   Electronically to        [id number] (retail)       [address]       [place], [place]  [address]       Ph: [phone]       Fax: [phone]   RxID:   [id number] SIMVASTATIN 40 MG TABS (SIMVASTATIN) one by mouth qpm  #90 x 1   Entered and Authorized by:   D. Thomos Lemons DO   Signed by:   D. Thomos Lemons DO on 02/01/2010   Method used:   Electronically to        [id number] (retail)       [address]       [place], [place]  [address]       Ph: [phone]       Fax: [phone]   RxID:   [id number] BYETTA 10 MCG PEN 10 MCG/0.04ML SOLN (EXENATIDE) use two times a day as directed  #1 month x 5   Entered and Authorized by:   D. Thomos Lemons DO   Signed by:   D. Thomos Lemons DO on 02/01/2010   Method used:   Electronically to        [id number] (retail)       [address]       [place], [place]  [address]       Ph: [phone]       Fax: [phone]   RxID:   [id number]   Current Allergies (reviewed today): No known allergies    Patient Consent for Use and Disclosure of Protected Health Information Patient: Jacob Bishop Willcox   Completed by: Mervin Kung CMA Date Completed: 02/01/2010

## 2010-09-26 NOTE — Assessment & Plan Note (Signed)
Summary: CAD/ANAS      Allergies Added: NKDA  Visit Type:  1 yr f/u Primary Provider:  Dondra Spry DO  CC:  edema/leg...denies any cp or sob.  History of Present Illness: [name] comes in today for evaluation and management of his coronary disease. He said remote percutaneous coronary intervention and has done remarkably well. He has not wanted to have stress test because of his finances and we've treated medically.  He is having no symptoms of angina or ischemic equivalence. He does have some mild dyspnea on exertion which has not changed. He denies orthopnea, PND or increased peripheral edema.  His blood sugars better the best control and sometime at 6.4%. LFTs are mildly elevated which is probably from his diabetes and fatty liver. Electrolytes and stable. He really likes primary care at [facility].  Current Medications (verified): 1)  Aspirin Low Dose 81 Mg  Tabs (Aspirin) .... Take 1 Tablet By Mouth Once A Day 2)  Simvastatin 40 Mg Tabs (Simvastatin) .... One By Mouth Qpm 3)  Fish Oil 1000 Mg  Caps (Omega-3 Fatty Acids) .... Take 1 Tablet By Mouth Once A Day 4)  Nasonex 50 Mcg/act Susp (Mometasone Furoate) .... 2 Sprays Each Nostril Once Daily 5)  Bd Pen Needle Short U/f 31g X 8 Mm Misc (Insulin Pen Needle) .... Use Twice Daily For Insulin Injection 6)  Freestyle Lite Test  Strp (Glucose Blood) .... Test Once Daily 7)  Amlodipine Besylate 5 Mg Tabs (Amlodipine Besylate) .... One By Mouth Once Daily 8)  Januvia 100 Mg Tabs (Sitagliptin Phosphate) .... One By Mouth Once Daily  Allergies (verified): No Known Drug Allergies  Past History:  Past Medical History: Last updated: 10/22/2009 CAD, NATIVE VESSEL (ICD-414.01) HYPERLIPIDEMIA (ICD-272.4) HYPERTENSION (ICD-401.9) EDEMA (ICD-782.3) OBESITY, HX OF (ICD-V13.8) SLEEP APNEA, OBSTRUCTIVE (ICD-327.23) DIABETES MELLITUS, TYPE II (ICD-250.00) CELLULITIS (ICD-682.9) DERMATITIS (ICD-692.9) RHINOSINUSITIS, ACUTE  (ICD-461.8) SKIN LESION (ICD-709.9) POSITIONAL VERTIGO (ICD-386.11) COLONIC POLYPS, HX OF (ICD-V12.72)  Past Surgical History: Last updated: 10/05/2009 Cardiac Catherization       Family History: Last updated: 10/05/2009 Father and sister with heart disease Mother - cancer          Social History: Last updated: 10/05/2009 Occupation: Restaurant business Married Former Smoker            Risk Factors: Smoking Status: quit (12/21/2008)  Review of Systems       negative other than history of present illness  Vital Signs:  Patient profile:   70 year old male Height:      72 inches Weight:      307 pounds BMI:     41.79 Pulse rate:   85 / minute Pulse rhythm:   irregular BP sitting:   110 / 60  (left arm) Cuff size:   large  Vitals Entered By: Jacob Bishop, CMA (October 26, 2009 3:04 PM)  Physical Exam  General:  obese.   Head:  normocephalic and atraumatic Eyes:  PERRLA/EOM intact; conjunctiva and lids normal. Neck:  Neck supple, no JVD. No masses, thyromegaly or abnormal cervical nodes. Chest Jacob Bishop:  no deformities or breast masses noted Lungs:  Clear bilaterally to auscultation and percussion. Heart:  Non-displaced PMI, chest non-tender; regular rate and rhythm, S1, S2 without murmurs, rubs or gallops. Carotid upstroke normal, no bruit. Normal abdominal aortic size, no bruits. Femorals normal pulses, no bruits. Pedals normal pulses. No edema, no varicosities. Msk:  Back normal, normal gait. Muscle strength and tone normal. Pulses:  pulses  normal in all 4 extremities Extremities:  trace left pedal edema and trace right pedal edema.   Neurologic:  Alert and oriented x 3. Skin:  Intact without lesions or rashes. Psych:  Normal affect.   EKG  Procedure date:  10/26/2009  Findings:      normal sinus rhythm, normal EKG  Impression & Recommendations:  Problem # 1:  CAD, NATIVE VESSEL (ICD-414.01) Assessment Unchanged  His updated medication list for this  problem includes:    Aspirin Low Dose 81 Mg Tabs (Aspirin) .Marland Kitchen... Take 1 tablet by mouth once a day    Amlodipine Besylate 5 Mg Tabs (Amlodipine besylate) ..... One by mouth once daily  Problem # 2:  HYPERTENSION (ICD-401.9) Assessment: Improved  His updated medication list for this problem includes:    Aspirin Low Dose 81 Mg Tabs (Aspirin) .Marland Kitchen... Take 1 tablet by mouth once a day    Amlodipine Besylate 5 Mg Tabs (Amlodipine besylate) ..... One by mouth once daily  Problem # 3:  EDEMA (ICD-782.3) Assessment: Improved  Problem # 4:  HYPERLIPIDEMIA (ICD-272.4)  His updated medication list for this problem includes:    Simvastatin 40 Mg Tabs (Simvastatin) ..... One by mouth qpm  Problem # 5:  OBESITY, HX OF (ICD-V13.8) Assessment: Unchanged  Problem # 7:  DIABETES MELLITUS, TYPE II (ICD-250.00) Assessment: Improved  The following medications were removed from the medication list:    Glumetza 500 Mg Xr24h-tab (Metformin hcl) ..... One by mouth bid His updated medication list for this problem includes:    Aspirin Low Dose 81 Mg Tabs (Aspirin) .Marland Kitchen... Take 1 tablet by mouth once a day    Januvia 100 Mg Tabs (Sitagliptin phosphate) ..... One by mouth once daily  Other Orders: EKG w/ Interpretation (93000)  Patient Instructions: 1)  Your physician recommends that you schedule a follow-up appointment in: year with dr Jacob Bishop 2)  Your physician recommends that you continue on your current medications as directed. Please refer to the Current Medication list given to you today.

## 2010-09-26 NOTE — Progress Notes (Signed)
Summary: Onglyza refill  Phone Note Refill Request Call back at [phone] Message from:  Patient on June 20, 2010 9:02 AM  Refills Requested: Medication #1:  ONGLYZA 5 MG TABS.   Dosage confirmed as above?Dosage Confirmed   Brand Name Necessary? No   Supply Requested: 1 month Pt states sample has worked well, would like Rx [facility]   Method Requested: Electronic Next Appointment Scheduled: 12.13.11 Initial call taken by: Lannette Donath,  June 20, 2010 9:03 AM  Follow-up for Phone Call        calll returned to patient he states the medication is working well, he took his last sample yesterday and would like to continue with the medication. He states that he is taking medication once per day. Rx completed in Dr. Tiajuana Amass Follow-up by: Glendell Docker CMA,  June 20, 2010 11:10 AM    New/Updated Medications: ONGLYZA 5 MG TABS (SAXAGLIPTIN HCL) Take 1 tablet by mouth once a day Prescriptions: ONGLYZA 5 MG TABS (SAXAGLIPTIN HCL) Take 1 tablet by mouth once a day  #30 x 3   Entered by:   Glendell Docker CMA   Authorized by:   D. Thomos Lemons DO   Signed by:   Glendell Docker CMA on 06/20/2010   Method used:   Electronically to        [facility] # [address] (retail)       [address]       [place], [place]  [address]       Ph: [phone]       Fax: [phone]   RxID:   [id number]

## 2010-09-26 NOTE — Progress Notes (Signed)
Summary: PT IN LAB NOW, NEEDS LAB ORDER  Phone Note From Other Clinic   Caller: Vicky at [facility] [phone] Summary of Call: Received call from [facility] stating pt is there for labs and they need an order. Please advise what labs pt needs to have. Nicki Guadalajara Fergerson CMA Duncan Dull)  July 30, 2010 8:23 AM   Follow-up for Phone Call        Per Dr Artist Pais, pt needs BMP, HgbA1c 250.02; LFTs 790.4. Order called to [facility] and placed in IDX. Nicki Guadalajara Fergerson CMA Duncan Dull)  July 30, 2010 8:45 AM

## 2010-09-26 NOTE — Progress Notes (Signed)
Summary: Lab orders  Phone Note Call from Patient   Caller: Patient Details for Reason: Labs Summary of Call: Pt call   going for labs at [facility] tomorrow     Please send Lab order Initial call taken by: Darral Dash,  May 28, 2010 9:24 AM  Follow-up for Phone Call        lab orders from June 10th office visit  have been entered for Jacob Bishop for 05/29/2010  Follow-up by: Glendell Docker CMA,  May 28, 2010 9:46 AM

## 2010-09-26 NOTE — Assessment & Plan Note (Signed)
Summary: ? Sinus, runny nose, congestion, H/A- jr   Vital Signs:  Patient profile:   70 year old male Weight:      309.75 pounds BMI:     42.16 O2 Sat:      96 % on Room air Temp:     98.2 degrees F Pulse rate:   109 / minute Pulse rhythm:   regular Resp:     20 per minute BP sitting:   130 / 60  (right arm) Cuff size:   large  Vitals Entered By: Glendell Docker CMA (September 21, 2009 10:02 AM)  O2 Flow:  Room air  Primary Care Provider:  D. Thomos Lemons DO  CC:  URI symptoms.  History of Present Illness:  URI Symptoms      This is a 70 year old man who presents with URI symptoms.  The patient reports nasal congestion, clear nasal discharge, purulent nasal discharge, productive cough, and earache.  Associated symptoms include fever, stiff neck, dyspnea, wheezing, and use of an antipyretic.  The patient denies vomiting and diarrhea.  The patient also reports itchy watery eyes, sneezing, headache, muscle aches, and severe fatigue.    Htn - could not tolerate Azor.   medication made him feel hyper.  he resumed amlodipine  Allergies (verified): No Known Drug Allergies  Past History:  Past Medical History: Colonic polyps, hx of Coronary artery disease Diabetes mellitus, type II Hyperlipidemia    Hypertension   Postional Vertigo History of thermal injury to right upper back with chronic scar tissue.    Past Surgical History: Cardiac Catherization      Family History: Father and sister with heart disease Mother - cancer         Social History: Occupation: Musician business Married Former Smoker           Physical Exam  General:  alert and overweight-appearing.   Ears:  R ear normal and L ear normal.   Mouth:  pharyngeal erythema.   Lungs:  normal respiratory effort, normal breath sounds, no crackles, and no wheezes.   Heart:  normal rate, regular rhythm, and no gallop.     Impression & Recommendations:  Problem # 1:  RHINOSINUSITIS, ACUTE  (ICD-461.8)  His updated medication list for this problem includes:    Nasonex 50 Mcg/act Susp (Mometasone furoate) .Marland Kitchen... 2 sprays each nostril once daily    Cefdinir 300 Mg Caps (Cefdinir) ..... One by mouth two times a day  Instructed on treatment. Call if symptoms persist or worsen.   Problem # 2:  HYPERTENSION (ICD-401.9)  he could not tolerate azor.  it made him feel hyper.  resume amlodipne.  The following medications were removed from the medication list:    Azor 5-20 Mg Tabs (Amlodipine-olmesartan) .Marland Kitchen... 1/2 by mouth once daily His updated medication list for this problem includes:    Amlodipine Besylate 5 Mg Tabs (Amlodipine besylate) ..... One by mouth once daily  BP today: 130/60 Prior BP: 114/64 (08/03/2009)  Labs Reviewed: K+: 4.2 (07/27/2009) Creat: : 1.1 (07/27/2009)   Chol: 137 (02/15/2009)   HDL: 30.60 (02/15/2009)   LDL: 83 (02/15/2009)   TG: 116.0 (02/15/2009)  Complete Medication List: 1)  Aspirin Low Dose 81 Mg Tabs (Aspirin) .... Take 1 tablet by mouth once a day 2)  Simvastatin 40 Mg Tabs (Simvastatin) .... One by mouth qpm 3)  Fish Oil 1000 Mg Caps (Omega-3 fatty acids) .... Take 1 tablet by mouth once a day 4)  Nasonex 50 Mcg/act  Susp (Mometasone furoate) .... 2 sprays each nostril once daily 5)  Byetta 10 Mcg Pen 10 Mcg/0.86ml Soln (Exenatide) .... Inject subcutaneously two times a day 6)  Bd Pen Needle Short U/f 31g X 8 Mm Misc (Insulin pen needle) .... Use twice daily for insulin injection 7)  Freestyle Lite Test Strp (Glucose blood) .... Test once daily 8)  Cefdinir 300 Mg Caps (Cefdinir) .... One by mouth two times a day 9)  Amlodipine Besylate 5 Mg Tabs (Amlodipine besylate) .... One by mouth once daily Prescriptions: AMLODIPINE BESYLATE 5 MG TABS (AMLODIPINE BESYLATE) one by mouth once daily  #30 x 5   Entered and Authorized by:   D. Thomos Lemons DO   Signed by:   D. Thomos Lemons DO on 09/21/2009   Method used:   Electronically to        [facility] [address] (retail)       [address]       [place], [place]  [address]       Ph: [phone]       Fax: [phone]   RxID:   [id number] CEFDINIR 300 MG CAPS (CEFDINIR) one by mouth two times a day  #20 x 0   Entered and Authorized by:   D. Thomos Lemons DO   Signed by:   D. Thomos Lemons DO on 09/21/2009   Method used:   Electronically to        [facility] [address]* (retail)       [address]       [place], [place]  [address]       Ph: [phone]       Fax: [phone]   RxID:   [id number]   Current Allergies (reviewed today): No known allergies

## 2010-09-26 NOTE — Assessment & Plan Note (Signed)
Summary: 4 mon   follow up/hea   Vital Signs:  Patient profile:   70 year old male Height:      72 inches Weight:      303 pounds BMI:     41.24 O2 Sat:      95 % on Room air Temp:     98.3 degrees F oral Pulse rate:   83 / minute Pulse rhythm:   regular Resp:     22 per minute BP sitting:   110 / 64  (left arm) Cuff size:   large  Vitals Entered By: Glendell Docker CMA (June 04, 2010 3:45 PM)  O2 Flow:  Room air CC: 4 month follow up, Type 2 diabetes mellitus follow-up Is Patient Diabetic? Yes Pain Assessment Patient in pain? no      Comments low blood sugar 130 high 175 avg unknown   Primary Care Provider:  Dondra Spry DO  CC:  4 month follow up and Type 2 diabetes mellitus follow-up.  History of Present Illness:  Type 2 Diabetes Mellitus Follow-Up      This is a Jacob Bishop who presents for Type 2 diabetes mellitus follow-up.  The patient denies weight loss.  The patient denies the following symptoms: chest pain.  Since the last visit the patient reports poor dietary compliance, noncompliance with medications, and not exercising regularly.    Preventive Screening-Counseling & Management  Alcohol-Tobacco     Smoking Status: quit  Allergies (verified): No Known Drug Allergies  Past History:  Past Medical History: CAD, NATIVE VESSEL (ICD-414.01) HYPERLIPIDEMIA (ICD-272.4) HYPERTENSION (ICD-401.9) EDEMA (ICD-782.3) OBESITY, HX OF (ICD-V13.8)  SLEEP APNEA, OBSTRUCTIVE (ICD-327.23) DIABETES MELLITUS, TYPE II (ICD-250.00) CELLULITIS (ICD-682.9) DERMATITIS (ICD-692.9) RHINOSINUSITIS, ACUTE (ICD-461.8) SKIN LESION (ICD-709.9)  POSITIONAL VERTIGO (ICD-386.11) COLONIC POLYPS, HX OF (ICD-V12.72)  Family History: Father and sister with heart disease Mother - cancer            Social History: Occupation: Sports administrator  Married Former Smoker              Physical Exam  General:  alert and overweight-appearing.   Lungs:  normal respiratory  effort and normal breath sounds.   Heart:  normal rate, regular rhythm, and no gallop.   Psych:  normally interactive, good eye contact, not anxious appearing, and not depressed appearing.     Impression & Recommendations:  Problem # 1:  DIABETES MELLITUS, TYPE II (ICD-250.00) Assessment Deteriorated trial of onglyza.   stressed importance of dietary compliance.  His updated medication list for this problem includes:    Aspirin Low Dose 81 Mg Tabs (Aspirin) .Marland Kitchen... Take 1 tablet by mouth once a day    Onglyza 5 Mg Tabs (Saxagliptin hcl)  Labs Reviewed: Creat: 0.7 (05/29/2010)    Reviewed HgBA1c results: 7.5 (05/29/2010)  6.8 (01/28/2010)  Problem # 2:  HYPERTENSION (ICD-401.9) Assessment: Unchanged  His updated medication list for this problem includes:    Amlodipine Besylate 5 Mg Tabs (Amlodipine besylate) ..... One by mouth once daily  BP today: 110/64 Prior BP: 100/60 (02/01/2010)  Labs Reviewed: K+: 4.8 (05/29/2010) Creat: : 0.7 (05/29/2010)   Chol: 148 (01/28/2010)   HDL: 31.30 (01/28/2010)   LDL: 91 (01/28/2010)   TG: 127.0 (01/28/2010)  Complete Medication List: 1)  Aspirin Low Dose 81 Mg Tabs (Aspirin) .... Take 1 tablet by mouth once a day 2)  Simvastatin 40 Mg Tabs (Simvastatin) .... One by mouth qpm 3)  Fish Oil 1000 Mg Caps (Omega-3 fatty  acids) .... Take 1 tablet by mouth once a day 4)  Nasonex 50 Mcg/act Susp (Mometasone furoate) .... 2 sprays each nostril once daily 5)  Bd Pen Needle Short U/f 31g X 8 Mm Misc (Insulin pen needle) .... Use twice daily for insulin injection 6)  Freestyle Lite Test Strp (Glucose blood) .... Test once daily 7)  Amlodipine Besylate 5 Mg Tabs (Amlodipine besylate) .... One by mouth once daily 8)  Onglyza 5 Mg Tabs (Saxagliptin hcl)  Other Orders: Influenza Vaccine MCR ([address]) Administration Flu vaccine - MCR (U0454)  Patient Instructions: 1)  Please schedule a follow-up appointment in 2 months. 2)  Limit your carbohydrate  intake to 25 grams per meal   Immunizations Administered:  Influenza Vaccine # 1:    Vaccine Type: Fluvax MCR    Site: left deltoid    Mfr: GlaxoSmithKline    Dose: 0.5 ml    Route: IM    Given by: Glendell Docker CMA    Exp. Date: 02/22/2011    Lot #: [id number]    VIS given: 03/19/10 version given June 04, 2010.  Flu Vaccine Consent Questions:    Do you have a history of severe allergic reactions to this vaccine? no    Any prior history of allergic reactions to egg and/or gelatin? no    Do you have a sensitivity to the preservative Thimersol? no    Do you have a past history of Guillan-Barre Syndrome? no    Do you currently have an acute febrile illness? no    Have you ever had a severe reaction to latex? no    Vaccine information given and explained to patient? yes   Current Allergies (reviewed today): No known allergies

## 2010-12-03 ENCOUNTER — Ambulatory Visit: Payer: Self-pay | Admitting: Internal Medicine

## 2010-12-05 ENCOUNTER — Telehealth: Payer: Self-pay | Admitting: Internal Medicine

## 2010-12-05 NOTE — Telephone Encounter (Signed)
Pt stated that he went to [facility] location to have blood work done but, there were no lab orders in system. Pt left. Pt said that if he needs to do labs before his appt he will go back tomorrow. Patient's next appt is on 4.17.12.

## 2010-12-05 NOTE — Telephone Encounter (Signed)
Blood work entered for Friday April 13th, for [facility]

## 2010-12-06 ENCOUNTER — Other Ambulatory Visit (INDEPENDENT_AMBULATORY_CARE_PROVIDER_SITE_OTHER): Payer: Medicare Other

## 2010-12-06 DIAGNOSIS — E785 Hyperlipidemia, unspecified: Secondary | ICD-10-CM

## 2010-12-06 DIAGNOSIS — I10 Essential (primary) hypertension: Secondary | ICD-10-CM

## 2010-12-06 DIAGNOSIS — E119 Type 2 diabetes mellitus without complications: Secondary | ICD-10-CM

## 2010-12-06 LAB — BASIC METABOLIC PANEL
CO2: 29 mEq/L (ref 19–32)
Chloride: 106 mEq/L (ref 96–112)
Creatinine, Ser: 0.7 mg/dL (ref 0.4–1.5)
Potassium: 4.5 mEq/L (ref 3.5–5.1)

## 2010-12-06 LAB — LIPID PANEL
Cholesterol: 155 mg/dL (ref 0–200)
LDL Cholesterol: 99 mg/dL (ref 0–99)
Total CHOL/HDL Ratio: 4
Triglycerides: 105 mg/dL (ref 0.0–149.0)
VLDL: 21 mg/dL (ref 0.0–40.0)

## 2010-12-10 ENCOUNTER — Encounter: Payer: Self-pay | Admitting: Internal Medicine

## 2010-12-10 ENCOUNTER — Ambulatory Visit (INDEPENDENT_AMBULATORY_CARE_PROVIDER_SITE_OTHER): Payer: Medicare Other | Admitting: Internal Medicine

## 2010-12-10 DIAGNOSIS — E119 Type 2 diabetes mellitus without complications: Secondary | ICD-10-CM

## 2010-12-10 DIAGNOSIS — I10 Essential (primary) hypertension: Secondary | ICD-10-CM

## 2010-12-10 MED ORDER — FLUTICASONE PROPIONATE 50 MCG/ACT NA SUSP: 2.0000 | Freq: Every day | NASAL | Status: DC

## 2010-12-10 NOTE — Patient Instructions (Signed)
Complete blood work before your next appointment. BMET, A1c - 250.00

## 2010-12-16 NOTE — Assessment & Plan Note (Signed)
BP at goal. No change in meds   BP: 110/58 mmHg

## 2010-12-16 NOTE — Progress Notes (Signed)
Subjective:    Patient ID: Jacob Bishop, male    DOB: 1941/03/28, 70 y.o.   MRN: [id number]  Diabetes He presents for his follow-up diabetic visit. He has type 2 diabetes mellitus. His disease course has been improving. There are no hypoglycemic associated symptoms. There are no diabetic associated symptoms. Symptoms are improving. Pertinent negatives for diabetic complications include no CVA, peripheral neuropathy or retinopathy. Risk factors for coronary artery disease include hypertension, male sex, obesity and sedentary lifestyle. Current diabetic treatment includes oral agent (monotherapy). He is compliant with treatment most of the time. His weight is decreasing steadily. He is following a generally healthy diet. There is no change in his home blood glucose trend.  Hyperlipidemia  Hypertension There is no history of CVA or retinopathy.      Review of Systems No chest pain or dizziness.   No regular physical activity.  Likes to go fishing    Past Medical History  Diagnosis Date  . Hypertension   . Diabetes mellitus     type II  . CAD in native artery   . Hyperlipidemia   . Edema   . Obesity   . OSA (obstructive sleep apnea)   . Cellulitis   . Dermatitis   . Rhinosinusitis   . Skin lesion   . Positional vertigo   . Hx of colonic polyps     History   Social History  . Marital Status: Married    Spouse Name: N/A    Number of Children: N/A  . Years of Education: N/A   Occupational History  . OWNER, restaurant    Social History Main Topics  . Smoking status: Former Games developer  . Smokeless tobacco: Not on file  . Alcohol Use:   . Drug Use:   . Sexually Active:    Other Topics Concern  . Not on file   Social History Narrative   Grew up in [place]. Mother was [place], Father [place].    Past Surgical History  Procedure Date  . Cardiac catheterization     Family History  Problem Relation Age of Onset  . Cancer Mother   . Heart disease Father   .  Heart disease Sister     No Known Allergies  Current Outpatient Prescriptions on File Prior to Visit  Medication Sig Dispense Refill  . aspirin 81 MG tablet Take 81 mg by mouth daily.        . fish oil-omega-3 fatty acids 1000 MG capsule Take 1 g by mouth daily.        Marland Kitchen glucose blood (FREESTYLE LITE) test strip Use to test blood sugar once daily as instructed       . Insulin Pen Needle 31G X 8 MM MISC Use twice daily for insulin injection.       . Saxagliptin HCl (ONGLYZA) 5 MG TABS Take 1 tablet by mouth daily.        . simvastatin (ZOCOR) 20 MG tablet Take 20 mg by mouth daily.          BP 110/58  Pulse 67  Temp(Src) 97.6 F (36.4 C) (Oral)  Resp 20  Ht 6' (1.829 m)  Wt 289 lb (131.09 kg)  BMI 39.20 kg/m2  SpO2 97%    Objective:   Physical Exam  Constitutional:       Pleasant, NAD  Cardiovascular: Normal rate, regular rhythm and normal heart sounds.   Pulmonary/Chest: Effort normal and breath sounds normal.  Musculoskeletal: He exhibits edema.  Skin:  Skin is warm and dry.  Psychiatric: He has a normal mood and affect.          Assessment & Plan:

## 2010-12-16 NOTE — Assessment & Plan Note (Signed)
Stable.  No change is meds.  Encouraged pt to stay on low carb diet.  Lab Results  Component Value Date   HGBA1C 6.2 12/06/2010   Lab Results  Component Value Date   MICROALBUR 1.3 01/28/2010

## 2011-01-07 NOTE — Assessment & Plan Note (Signed)
[facility]                            CARDIOLOGY OFFICE NOTE   NAME:Pohlmann, ASHTIN ROSNER                     MRN:          [id number]  DATE:10/26/2007                            DOB:          [birth year]    HISTORY:  Mr. Harkin comes in today.  He is having no angina or ischemic  symptoms.  We have not performed a stress Myoview in him in over 7 years  because of lack of insurance and his desire not to pay for it.  He is  followed by Dr. Artist Pais.   LABORATORY DATA:  Recent cholesterol was 111, HDL was 29.6,  triglycerides 107, total cholesterol to HDL ratio was 3.8, VLDL was 21,  LDL was 60.  Chemistry profile was normal.  Hemoglobin A1c was 6.9%.  His LFTs were normal.  Thyroid was normal.  These were reviewed with the  patient at length in the office.   CURRENT MEDICATIONS:  1. Norvasc 5 mg a day.  2. Vytorin 10/40 daily.  3. Glucophage 500 mg b.i.d.  4. Fish oil 2 daily.  5. Aspirin 81 mg a day.   PHYSICAL EXAMINATION:  VITAL SIGNS:  Blood pressure today is 124/70, his  pulse is 77 and regular, weight is 321.  HEENT:  Normocephalic, atraumatic.  PERRLA.  Extraocular movements are  intact.  Sclerae are clear.  Face symmetry is normal.  NECK:  Carotid upstrokes are equal bilaterally without bruits, no JVD.  Thyroid is not enlarged.  Trachea is midline.  LUNGS:  Clear.  HEART:  Reveals a poorly appreciated PMI.  Normal S1-S2 without gallop.  ABDOMEN:  Protuberant with good bowel sounds.  No obvious midline bruit.  EXTREMITIES:  Trace edema.  Pulses are present.  NEURO:  Intact.   ASSESSMENT:  Mr. Grewe is stable from our standpoint.  I have asked him  to continue with his current medications.  We will see him back again in  a year.     Thomas C. Daleen Squibb, MD, [facility]  Electronically Signed    TCW/MedQ  DD: 10/26/2007  DT: 10/27/2007  Job #: [id number]

## 2011-01-07 NOTE — Assessment & Plan Note (Signed)
[facility]                            CARDIOLOGY OFFICE NOTE   Jacob, Bishop Jacob Bishop                     MRN:          [id number]  DATE:11/01/2008                            DOB:          06-06-1941    Jacob Bishop comes in today for followup.  I saw him last in March 2009.  He  is doing remarkably well.  He is followed by Dr. Artist Pais who he is thrilled  over.   He is having no angina or ischemic symptoms.  He has some baseline  dyspnea on exertion.  It is noted in my previous note, he has not had  any objective assessment of his coronary disease in about 6 years.  His  previous interventions include stent placement in 1997 in a totally  occluded right coronary artery.  He has also had a stent placed to mid  LAD in October 2004.   He has done remarkably well.  He has normal left ventricular systolic  function.   He has actually lost about 20 pounds.  Unfortunately, his hemoglobin A1c  which was checked recently was 8.2%.  TSH was normal.  His transaminases  were up a minimal amount.   CURRENT MEDICATIONS:  1. Norvasc 5 mg per day.  2. Vytorin 10/40 daily.  3. Fish oil 2 daily.  4. Aspirin 81 mg a day.  5. Glucophage 500 mg p.o. b.i.d.  6. He takes hydrochlorothiazide 12.5 p.r.n.   His swelling has been under good control, however.   PHYSICAL EXAMINATION:  VITAL SIGNS:  His blood pressure is 106/66, his  pulse is 80 and regular, his weight is 307.  HEENT:  Essentially normal.  NECK:  Supple.  Carotid upstrokes were equal bilaterally without bruits.  No JVD.  Thyroid is not enlarged.  Trachea is midline.  LUNGS:  Clear to  auscultation and percussion.  HEART:  Poorly appreciated PMI.  Normal S1, S2.  No gallop.  ABDOMEN:  Slightly protuberant, good bowel sounds.  EXTREMITIES:  No cyanosis or clubbing, but there is 1+ chronic edema.  Pulses were present.  NEUROLOGIC:  Intact.   His electrocardiogram shows sinus rhythm with some ST-segment  changes,  that are worse in III and aVF.  Changes in III could be expected;  however, they would differ on his last EKG.  AVF shows some flattening  with some biphasic Ts.   Jacob Bishop is asymptomatic.  Unfortunately, he is diabetic and have a  defective warning system as I have always warned him.  He clearly needs  to get his blood sugar under better control.  I suspect his mild  elevation in transaminases are from fatty liver.  He is having no  symptoms.  He could have some of this from Vytorin, but certainly does  not warrant discontinuing the drug.   I am encouraging him to continue to lose weight.  He will follow up with  Dr. Artist Pais who can follow up on his lipids and hemoglobin A1c in the next  couple of months.  I have made no changes in his medical program.  I  will plan on seeing him back in a year.     Thomas C. Daleen Squibb, MD, [facility]  Electronically Signed    TCW/MedQ  DD: 11/01/2008  DT: 11/02/2008  Job #: [id number]

## 2011-01-10 ENCOUNTER — Encounter: Payer: Self-pay | Admitting: Cardiology

## 2011-01-10 ENCOUNTER — Ambulatory Visit (INDEPENDENT_AMBULATORY_CARE_PROVIDER_SITE_OTHER): Payer: Medicare Other | Admitting: Cardiology

## 2011-01-10 VITALS — BP 98/62 | HR 75 | Resp 18 | Ht 72.0 in | Wt 289.0 lb

## 2011-01-10 DIAGNOSIS — I2510 Atherosclerotic heart disease of native coronary artery without angina pectoris: Secondary | ICD-10-CM

## 2011-01-10 NOTE — Discharge Summary (Signed)
Jacob Bishop, Jacob Bishop                            ACCOUNT NO.:  [id number]   MEDICAL RECORD NO.:  [id number]                   PATIENT TYPE:  OIB   LOCATION:  6529                                 FACILITY:  [facility]   PHYSICIAN:  Tereso Newcomer, P.A.                  DATE OF BIRTH:  1941-05-27   DATE OF ADMISSION:  06/14/2003  DATE OF DISCHARGE:  06/15/2003                                 DISCHARGE SUMMARY   DISCHARGE DIAGNOSES:  1. Coronary artery disease.     A. Status post Cypher stent placement to the left anterior descending        artery, reducing the stenosis from 80% to 10%.     B. Residual disease in the right coronary artery, 50% mid, 40% to 50%        instent, distal.     C. Ejection fraction 60%.     D. History of complicated percutaneous coronary intervention of the right        coronary artery in 1997.  2. Hypertension.  3. Treated dyslipidemia.  4. Obesity.  5. Chronic venous insufficiency.  6. History of colon polyps.   PROCEDURES PERFORMED:  1. Cardiac catheterization.  2. Percutaneous coronary intervention by Dr. Charlies Constable on June 14, 2003:  LAD 80%, circumflex irregular, RCA 50% mid, 40% to 50% instent     distal stenosis, LV normal with an ejection fraction of 60%.  3. Percutaneous coronary intervention, Cypher stent to the LAD, reducing     stenosis from 80% to 10%.   HISTORY OF PRESENT ILLNESS:  Please see the cardiology office note from  June 08, 2003, for completed details.  Briefly this 70 year old male presented to the office with a complaint of 3  to 4 days of post prandial angina relieved with nitroglycerin. It was felt  that he needed further evaluation with cardiac catheterization.   HOSPITAL COURSE:  He was brought into [facility] electively on  June 14, 2003. Dr. Juanda Chance performed the procedure as noted above. The  patient's groin was sealed with Angioseal. He was enrolled in the Steeple  study. He tolerated  the coronary  intervention and had no immediate  complications.   LABORATORY DATA:  Post procedure CK-MB negative, troponin I 0.01, 0.10. Pre  catheterization labs:  White count 6600, hemoglobin 14.9, hematocrit 43.2,  platelet count 158,000, INR 1.0. Sodium 142, potassium 4.3, chloride 106,  CO2 32, glucose 102, BUN 12, creatinine 0.8, calcium  9.1.   Chest x-ray:  Suboptimal inspiration, no active disease.   DISCHARGE MEDICATIONS:  1. Plavix 75 mg daily.  2. Aspirin 325 mg p.o. daily.  3. Norvasc 5 mg daily.  4. Lipitor 20 mg daily.  5. Zetia 10 mg daily.  6. Folic acid.  7. Nitroglycerin p.r.n. chest pain.  8. Pain management Tylenol as needed, nitroglycerin as needed for  chest     pain.   DISCHARGE INSTRUCTIONS:  He is to call our office or 911 for recurrent chest  pain. Activity no driving, heavy lifting, exertional, sexual activity for 1  week. He is to slowly advance as tolerated. Diet low fat, low sodium. Wound  care, the patient is to call our office for any groin swelling, bleeding or  bruising. Special instructions, the patient has been enrolled in the Steeple  study and must remain on enteric coated aspirin 325 mg daily for at least 30  days. Our research staff will set up follow up with him.   FOLLOW UP:  The patient will follow up with the P.A. for Dr. Daleen Squibb on  June 29, 2003, at 12 p.m. He is to see Dr. Debby Bud as needed. We will  subsequently set the patient up for a 6 to 8 week follow up with Dr. Daleen Squibb at  his appointment on June 29, 2003.                                                  Tereso Newcomer, P.A.    SW/MEDQ  D:  06/15/2003  T:  06/15/2003  Job:  [id number]

## 2011-01-10 NOTE — Patient Instructions (Signed)
Your physician recommends that you schedule a follow-up appointment in: 1 year with Dr. Wall  

## 2011-01-10 NOTE — Procedures (Signed)
NAMETJAY, VELAZQUEZ NO.:  [id number]   MEDICAL RECORD NO.:  [id number]          PATIENT TYPE:  OUT   LOCATION:  SLEEP CENTER                 FACILITY:  [facility]   PHYSICIAN:  Marcelyn Bruins, M.D. [facility] DATE OF BIRTH:  1941-02-19   DATE OF STUDY:  10/01/2004                              NOCTURNAL POLYSOMNOGRAM   DATE OF STUDY:  October 01, 2004.   REFERRING PHYSICIAN:  Dr. Marcelyn Bruins   INDICATION FOR THE STUDY:  Hypersomnia with sleep apnea. Epworth sleepiness  score is 13.   SLEEP ARCHITECTURE:  The patient has a total sleep time of 305 minutes with  a sleep efficiency of 85%. There is very little slow wave sleep or REM.  Sleep onset latency was rapid at 9 minutes as well as REM onset.   IMPRESSION:  1.  Severe obstructive sleep apnea/hypopnea syndrome with a respiratory      disturbance index of 88 events per hour and oxygen desaturation as low      as 78%. Events were not positional.  2.  Very loud snoring noted throughout the study.  3.  Occasional premature atrial contraction and fusion beats without      clinical significance.      KC/MEDQ  D:  10/14/2004 11:53:29  T:  10/14/2004 20:32:07  Job:  [id number]

## 2011-01-10 NOTE — Assessment & Plan Note (Signed)
Stable, continue medical therapy. 

## 2011-01-10 NOTE — Progress Notes (Signed)
   Patient ID: Jacob Bishop, male    DOB: 1940-11-05, 70 y.o.   MRN: [id number]  HPI  [name] returns for E and M of her CAD. He has lost over 30 lbs, BP has dropped with Dr Jacob Bishop having to half his amlodopine, his HgbA1c is 6, and his edema has improved. He denies any angina. Dr Jacob Bishop follows his blood work.  EKG today shows a normal EKG.    Review of Systems  All other systems reviewed and are negative.      Physical Exam  Constitutional: He is oriented to person, place, and time. He appears well-developed and well-nourished. No distress.       obese  HENT:  Head: Normocephalic and atraumatic.  Eyes: EOM are normal. Pupils are equal, round, and reactive to light.  Neck: Normal range of motion. Neck supple. No JVD present. No tracheal deviation present.  Cardiovascular: Normal rate, regular rhythm, normal heart sounds and intact distal pulses.  Exam reveals no gallop.   No murmur heard. Pulmonary/Chest: Effort normal and breath sounds normal.  Abdominal: Soft. Bowel sounds are normal. He exhibits no distension.  Musculoskeletal: Normal range of motion.       1+ pitting edema, chronic brawny changes.  Neurological: He is alert and oriented to person, place, and time.  Skin: Skin is warm and dry.  Psychiatric: He has a normal mood and affect.

## 2011-01-10 NOTE — Procedures (Signed)
NAMELAWSON, MAHONE NO.:  [id number]   MEDICAL RECORD NO.:  [id number]          PATIENT TYPE:  OUT   LOCATION:  SLEEP CENTER                 FACILITY:  [facility]   PHYSICIAN:  Marcelyn Bruins, M.D. [facility] DATE OF BIRTH:  Jun 04, 1941   DATE OF STUDY:  12/24/2004                              NOCTURNAL POLYSOMNOGRAM   REFERRING PHYSICIAN:  Marcelyn Bruins, M.D.   INDICATION FOR STUDY:  Hypersomnia with sleep apnea.  The patient returns  for pressure optimization with CPAP.   EPWORTH SCORE:  16.   SLEEP ARCHITECTURE:  The patient had a total sleep time of 356 minutes with  significant quantities of REM. However, did not achieve slow wave sleep.  Sleep onset latency was normal, and REM onset was fairly rapid at 56  minutes. Sleep efficiency was 88%.   IMPRESSION:  1.  Good control of previously documented obstructive sleep apnea with CPAP      at 19 cm. There were small numbers of breakthrough events in the      patient's last REM. The the patient used his medium ResMed full face      mask from home without difficulty.  2.  No clinically some significant cardiac arrhythmias.  3.  Small numbers of leg jerks with very little sleep disruption.      KC/MEDQ  D:  12/30/2004 16:50:03  T:  12/30/2004 18:19:19  Job:  [id number]

## 2011-01-10 NOTE — Assessment & Plan Note (Signed)
[facility]                            CARDIOLOGY OFFICE NOTE   NAME:Jacob Bishop, Jacob Bishop                     MRN:          [id number]  DATE:10/30/2006                            DOB:          April 14, 1941    Mr. Jacob Bishop returns for further management of the following issues:   Coronary artery disease.  He is status post stent to 100% right coronary  artery x2 in 1997.  There is a residual 70% LAD.   He also has normal left ventricular function.   He has not had a stress Myoview in 6 years.  His last visit to [place] he did  not want to have this because he had no insurance.  He his having no  angina at present.  He does have some tingling in his left hand on  occasion which is not associated with exertion.  He also has some  burning symptoms under his left arm, which is also not exertion.  He is  having none of his classic angina.   Dr. Artist Pais is following him, and doing an excellent job with his lipids.  His last hemoglobin A1c was also good at 6.2%.   He meds are Norvasc 5 mg a day, aspirin 325 a day, Vytorin 10/40 daily,  Glucophage 500 mg p.o. b.i.d., fish oil 2 daily.   His blood pressure is 120/74, his pulse 75 and regular, his weight is  319, up 6.  HEENT:  Normocephalic, atraumatic.  PERRLA.  Extraocular movements  intact.  Sclerae clear.  Facial symmetry is normal.  Dentition is  satisfactory.  Carotids are full without bruits, there is no JVD, thyroid is not  enlarged.  Neck is supple.  LUNGS:  Clear to auscultation.  HEART:  Reveals a poorly appreciated PMI.  He has normal S1, S2.  No  murmur.  ABDOMINAL EXAM:  Protuberant, good bowel sounds.  Organomegaly cannot be  assessed.  EXTREMITIES:  Reveal 1+ good radial pulses bilaterally.  He has good  hand strength.  His pulses in his feet are normal.  He has got at least  2+ pitting edema.   His EKG is normal.   ASSESSMENT AND PLAN:  Jacob Bishop is stable from a cardiovascular  standpoint.  He,  again, wishes not to have a stress Myoview because of  no insurance.  I think because with no angina and normal EKG, this is  probably reasonable.   I have asked him to continue his current medications.  I will see him  back in a year.     Thomas C. Daleen Squibb, MD, [facility]  Electronically Signed    TCW/MedQ  DD: 10/30/2006  DT: 10/31/2006  Job #: [id number]   cc:   Barbette Hair. Artist Pais, DO

## 2011-01-10 NOTE — Cardiovascular Report (Signed)
Jacob Bishop, Jacob Bishop                            ACCOUNT NO.:  [id number]   MEDICAL RECORD NO.:  [id number]                   PATIENT TYPE:  OIB   LOCATION:  2875                                 FACILITY:  [facility]   PHYSICIAN:  Jacob Bishop, M.D.                  DATE OF BIRTH:  12/23/1940   DATE OF PROCEDURE:  06/14/2003  DATE OF DISCHARGE:                              CARDIAC CATHETERIZATION   PROCEDURE PERFORMED:   CARDIOLOGIST:  Jacob Bishop, M.D.   CLINICAL HISTORY:  Jacob Bishop is 70 years old and in 1997 had two overlapping  Gianturco-Rubin stents placed in the distal right coronary artery by Dr.  Riley Kill.  It was a long procedure that lasted five hours and resulted in  some skin radiation injury to his back.  He has done well since that time,  but recently developed recurrent chest pain, chest tightness an left arm  numbness not characteristically related to exertion and sometimes following  meals.  He was seen in the office and arrangements were made for him to come  in the hospital for catheterization.   DESCRIPTION OF PROCEDURE:  The procedure was performed via the right femoral  artery using arterial sheath and 6 French preformed coronary catheters.  A  femoral arterial puncture was performed and Omnipaque contrast was used.  After completion of the diagnostic study I made the decision to proceed with  intervention on the LAD.   The patient was enrolled in the Steeple trial and was randomized to  unfractionated heparin.  We gave him 600 mg of Plavix and unfractionated  heparin to prolong the ACT to greater than 300 second.  We used a Q4 6  [place] guiding catheter with side holes and a short floppy wire.  We crossed  the lesion in the proximal LAD with the wire without difficulty.  We  predilated with a 3.5 x 12 mm Quantum Maverick performing two inflations up  to 12 atmospheres for 30 seconds.  We then deployed a 3.5 x 18 mm CYPHER  stent deploying this with one inflation  of 16 atmospheres for 30 seconds.  Repeat diagnostics were then performed through the guiding catheter.   The patient tolerated the procedure well and left the laboratory in  satisfactory condition.   RESULTS:   ANGIOGRAPHIC DATA:  Left Main Coronary Artery:  The left main coronary  artery is free of significant disease.   Left Anterior Descending Artery:  The left anterior descending artery gave  rise to two septal perforators and two diagonal branches.  There was 80%  focal narrowing just after the first diagonal branch.   Circumflex Artery:  The circumflex artery gave rise to a marginal branch and  two posterolateral branches.  These vessels were free of significant  disease.   Right Coronary Artery:  The right coronary artery is a moderate-sized vessel  that gave rise  to right ventricular branches, a posterior descending branch  and two posterolateral branches.  There was 50% narrowing in the mid right  coronary artery.  There was 40-50% diffuse in-stent restenosis within the  two stents in the distal right coronary artery.  The distal vessels were  free of major obstruction.   VENTRICULOGRAPHIC DATA:  Left Ventriculogram:  The left ventriculogram  performed in the RAO projection showed good wall motion with no areas of  hypokinesis.  The estimated ejection fraction was 60%.   The aortic pressure was 124/82 with a mean of 101.  The left ventricular  pressure was 124/21.   Following stenting of the lesion in the proximal LAD the stenosis improved  from 80% to 10%.   CONCLUSION:  1. Coronary artery disease status post previous coronary intervention as     described above with 80% narrowing in the proximal left anterior     descending (slightly worse), no major obstruction in the circumflex     artery, 50% narrowing in the mid right coronary artery and 40-50%     narrowing within the stent in the distal right coronary artery, and     normal left ventricular function.  2.  Successful percutaneous coronary intervention of the lesion in the     proximal left anterior descending using a CYPHER stent with improvement     of narrowing from 80% to 10%.   DISPOSITION:  The patient was taken to his room for further observation.                                                 Jacob Bishop, M.D.    BB/MEDQ  D:  06/14/2003  T:  06/15/2003  Job:  [id number]   cc:   Rosalyn Gess. Norins, M.D. [facility]   Maisie Fus C. Wall, M.D.   Cardiopulmonary Laboratory

## 2011-01-19 ENCOUNTER — Other Ambulatory Visit: Payer: Self-pay | Admitting: Internal Medicine

## 2011-01-21 NOTE — Telephone Encounter (Signed)
Call placed to patient at [phone], to verify how he was advised to take medication per Dr Artist Pais. Patient has verified that he was instructed to take 1/2 tablet daily. Rx refill sent to pharmacy to reflect Amlodipine 5 mg  1/2 tablet by mouth once daily

## 2011-04-14 ENCOUNTER — Telehealth: Payer: Self-pay | Admitting: Internal Medicine

## 2011-04-14 NOTE — Telephone Encounter (Signed)
Refill- onglyza 5mg  tab b-ms. Take one tablet by mouth one time daily. Qty 30. Last fill 7.20.12

## 2011-04-15 MED ORDER — SAXAGLIPTIN HCL 5 MG PO TABS: 5.0000 mg | ORAL_TABLET | Freq: Every day | ORAL | Status: DC

## 2011-04-15 NOTE — Telephone Encounter (Signed)
Rx refill sent to pharmacy. Patient is due for follow up in October 2012

## 2011-04-30 ENCOUNTER — Ambulatory Visit (INDEPENDENT_AMBULATORY_CARE_PROVIDER_SITE_OTHER): Payer: Medicare Other | Admitting: Family Medicine

## 2011-04-30 ENCOUNTER — Encounter: Payer: Self-pay | Admitting: Family Medicine

## 2011-04-30 VITALS — BP 110/64 | Temp 97.8°F | Wt 300.0 lb

## 2011-04-30 DIAGNOSIS — J329 Chronic sinusitis, unspecified: Secondary | ICD-10-CM

## 2011-04-30 MED ORDER — AMOXICILLIN-POT CLAVULANATE 875-125 MG PO TABS: 1.0000 | ORAL_TABLET | Freq: Two times a day (BID) | ORAL | Status: AC

## 2011-04-30 NOTE — Progress Notes (Signed)
  Subjective:    Patient ID: Jacob Bishop, male    DOB: 08/19/1941, 70 y.o.   MRN: [id number]  HPI Patient seen with 2 week history of intermittent headaches and nasal congestion. He's had frequent cervical neck discomfort and occasional tension-type headaches and previously these symptoms correlated with sinusitis. Nasal congestion but no purulent drainage. No fever. No sore throat. No cough. Intermittent left ear pain. No hearing changes. No known drug allergies.   Review of Systems  Constitutional: Positive for fatigue. Negative for fever and chills.  HENT: Positive for congestion, postnasal drip and sinus pressure. Negative for sore throat.   Respiratory: Negative for cough and shortness of breath.   Cardiovascular: Negative for chest pain.  Neurological: Positive for headaches.       Objective:   Physical Exam  Constitutional: He appears well-developed and well-nourished.  HENT:  Right Ear: External ear normal.  Left Ear: External ear normal.  Mouth/Throat: Oropharynx is clear and moist.       Swelling nasal passages bilaterally with some thick purulent drainage left naris  Cardiovascular: Normal rate and regular rhythm.   Pulmonary/Chest: Effort normal and breath sounds normal. No respiratory distress. He has no wheezes. He has no rales.          Assessment & Plan:  Probable acute sinusitis. Augmentin 875 mg twice daily for 10 days. Samples of Nasonex 2 sprays per nostril once daily

## 2011-04-30 NOTE — Patient Instructions (Signed)
Nasonex 2 sprays per nostril once daily.

## 2011-05-01 ENCOUNTER — Ambulatory Visit: Payer: PRIVATE HEALTH INSURANCE | Admitting: Internal Medicine

## 2011-06-02 ENCOUNTER — Other Ambulatory Visit: Payer: Medicare Other

## 2011-06-04 ENCOUNTER — Ambulatory Visit: Payer: Medicare Other

## 2011-06-04 DIAGNOSIS — E119 Type 2 diabetes mellitus without complications: Secondary | ICD-10-CM

## 2011-06-04 LAB — BASIC METABOLIC PANEL
BUN: 16 mg/dL (ref 6–23)
Chloride: 105 mEq/L (ref 96–112)
Creatinine, Ser: 0.8 mg/dL (ref 0.4–1.5)

## 2011-06-04 LAB — HEMOGLOBIN A1C: Hgb A1c MFr Bld: 6.3 % (ref 4.6–6.5)

## 2011-06-12 ENCOUNTER — Ambulatory Visit: Payer: Medicare Other | Admitting: Internal Medicine

## 2011-06-13 ENCOUNTER — Ambulatory Visit: Payer: PRIVATE HEALTH INSURANCE | Admitting: Internal Medicine

## 2011-07-09 ENCOUNTER — Ambulatory Visit (INDEPENDENT_AMBULATORY_CARE_PROVIDER_SITE_OTHER): Payer: Medicare Other | Admitting: Internal Medicine

## 2011-07-09 DIAGNOSIS — H8110 Benign paroxysmal vertigo, unspecified ear: Secondary | ICD-10-CM

## 2011-07-09 DIAGNOSIS — E785 Hyperlipidemia, unspecified: Secondary | ICD-10-CM

## 2011-07-09 DIAGNOSIS — E119 Type 2 diabetes mellitus without complications: Secondary | ICD-10-CM

## 2011-07-09 MED ORDER — ATORVASTATIN CALCIUM 20 MG PO TABS: 20.0000 mg | ORAL_TABLET | Freq: Every day | ORAL | Status: DC

## 2011-07-09 MED ORDER — MECLIZINE HCL 12.5 MG PO TABS: 12.5000 mg | ORAL_TABLET | Freq: Three times a day (TID) | ORAL | Status: AC | PRN

## 2011-07-09 NOTE — Progress Notes (Signed)
Subjective:    Patient ID: Jacob Bishop, male    DOB: 01/21/1941, 70 y.o.   MRN: [id number]  HPI  70 year old white male with past medical history of type 2 diabetes, hypertension and obstructive sleep apnea for routine followup. Overall patient has been doing very well but has "fell off the wagon". His dietary compliance has not been very good. He has regained approximately 12 pounds since his previous visit. He continues to take his diabetes medication without any side effects.  Hyperlipidemia-he is not sure his symptoms are related to taking simvastatin but he complains of joint pain in his hands and knees. He takes occasional ibuprofen.  Benign positional vertigo-he completed vestibular rehabilitation last year. He is not performing the Brandt-Daroff exercises as directed. He still has intermittent dizziness.    Review of Systems Negative for chest pain or shortness of breath  Past Medical History  Diagnosis Date  . Hypertension   . Diabetes mellitus     type II  . CAD in native artery   . Hyperlipidemia   . Edema   . Obesity   . OSA (obstructive sleep apnea)   . Cellulitis   . Dermatitis   . Rhinosinusitis   . Skin lesion   . Positional vertigo   . Hx of colonic polyps     History   Social History  . Marital Status: Married    Spouse Name: N/A    Number of Children: N/A  . Years of Education: N/A   Occupational History  . OWNER, restaurant    Social History Main Topics  . Smoking status: Former Smoker    Quit date: 01/10/1996  . Smokeless tobacco: Not on file  . Alcohol Use: Not on file  . Drug Use: Not on file  . Sexually Active: Not on file   Other Topics Concern  . Not on file   Social History Narrative   Grew up in [place]. Mother was [place], Father [place].    Past Surgical History  Procedure Date  . Cardiac catheterization     Family History  Problem Relation Age of Onset  . Cancer Mother   . Heart disease Father   . Heart  disease Sister     No Known Allergies  Current Outpatient Prescriptions on File Prior to Visit  Medication Sig Dispense Refill  . amLODipine (NORVASC) 5 MG tablet Take 1/2 tablet by mouth once daily  30 tablet  2  . aspirin 81 MG tablet Take 81 mg by mouth daily.        . fish oil-omega-3 fatty acids 1000 MG capsule Take 1 g by mouth daily.        . fluticasone (FLONASE) 50 MCG/ACT nasal spray 2 sprays by Nasal route daily as needed.        Marland Kitchen glucose blood (FREESTYLE LITE) test strip Use to test blood sugar once daily as instructed       . saxagliptin HCl (ONGLYZA) 5 MG TABS tablet Take 1 tablet (5 mg total) by mouth daily.  30 tablet  2  . Tamsulosin HCl (FLOMAX) 0.4 MG CAPS Take 0.4 mg by mouth daily.          BP 106/64  Pulse 72  Temp(Src) 97.4 F (36.3 C) (Oral)  Ht 5\' 11"  (1.803 m)  Wt 302 lb (136.986 kg)  BMI 42.12 kg/m2       Objective:   Physical Exam   Constitutional: Pleasant, NAD  Head: Normocephalic and atraumatic.  Neck:  Normal range of motion. Neck supple. No thyromegaly present. No carotid bruit Cardiovascular: Normal rate, regular rhythm and normal heart sounds.  Exam reveals no gallop and no friction rub.  No murmur heard. Pulmonary/Chest: Effort normal and breath sounds normal.  No wheezes. No rales.  Neurological: Alert. No cranial nerve deficit.  Skin: Skin is warm and dry.  Psychiatric: Normal mood and affect. Behavior is normal.      Assessment & Plan:

## 2011-07-09 NOTE — Assessment & Plan Note (Signed)
Patient completed vestibular rehabilitation last year. Unfortunately he is not performing bran back exercises on a regular basis. Patient advised to use meclizine as needed.

## 2011-07-09 NOTE — Assessment & Plan Note (Signed)
I doubt patient's arthralgias in his hands and knees are attributable to statin use. However we started atorvastatin in lieu of simvastatin.

## 2011-07-09 NOTE — Patient Instructions (Signed)
Please complete the following lab tests before your next follow up appointment: Bmet, a1c - 250.00 Lipid panel, LFTs - 272.4

## 2011-07-09 NOTE — Assessment & Plan Note (Signed)
Stable.  Pt to retry getting back on diabetic diet.  I encouraged mild weight training with 5 lbs wts. Lab Results  Component Value Date   HGBA1C 6.3 06/04/2011

## 2011-07-22 ENCOUNTER — Telehealth: Payer: Self-pay | Admitting: Internal Medicine

## 2011-07-22 MED ORDER — SAXAGLIPTIN HCL 5 MG PO TABS: 5.0000 mg | ORAL_TABLET | Freq: Every day | ORAL | Status: DC

## 2011-07-22 NOTE — Telephone Encounter (Signed)
Refill-onglyza 5mg  tab b-ms. Take one tablet (5mg  total) by mouth daily. Qty 30. Last fill 10.19.12

## 2011-07-22 NOTE — Telephone Encounter (Signed)
rx sent in electronically 

## 2011-09-04 ENCOUNTER — Other Ambulatory Visit: Payer: Self-pay | Admitting: *Deleted

## 2011-09-04 MED ORDER — AMLODIPINE BESYLATE 5 MG PO TABS: ORAL_TABLET | ORAL | Status: DC

## 2011-09-05 ENCOUNTER — Other Ambulatory Visit: Payer: Self-pay | Admitting: *Deleted

## 2011-09-05 MED ORDER — SIMVASTATIN 20 MG PO TABS: 20.0000 mg | ORAL_TABLET | Freq: Every day | ORAL | Status: DC

## 2011-09-05 NOTE — Telephone Encounter (Signed)
Pt having side effects from lipitor and wants to go back on simvastatin.  Pt having muscle soreness.  Ok per Dr Artist Pais, simvastatin 20 mg po qhs.  rx sent in to [facility] [address]

## 2011-10-20 DIAGNOSIS — E291 Testicular hypofunction: Secondary | ICD-10-CM | POA: Diagnosis not present

## 2011-10-20 DIAGNOSIS — N401 Enlarged prostate with lower urinary tract symptoms: Secondary | ICD-10-CM | POA: Diagnosis not present

## 2011-10-20 DIAGNOSIS — N529 Male erectile dysfunction, unspecified: Secondary | ICD-10-CM | POA: Diagnosis not present

## 2011-10-20 DIAGNOSIS — N200 Calculus of kidney: Secondary | ICD-10-CM | POA: Diagnosis not present

## 2011-10-24 DIAGNOSIS — E119 Type 2 diabetes mellitus without complications: Secondary | ICD-10-CM | POA: Diagnosis not present

## 2011-10-24 DIAGNOSIS — H2510 Age-related nuclear cataract, unspecified eye: Secondary | ICD-10-CM | POA: Diagnosis not present

## 2011-11-18 ENCOUNTER — Telehealth: Payer: Self-pay | Admitting: Internal Medicine

## 2011-11-18 MED ORDER — SAXAGLIPTIN HCL 5 MG PO TABS: 5.0000 mg | ORAL_TABLET | Freq: Every day | ORAL | Status: DC

## 2011-11-18 NOTE — Telephone Encounter (Signed)
rx sent in electronically 

## 2011-11-18 NOTE — Telephone Encounter (Signed)
Refill- onglyza 5mg  tab. Take one tablet by mouth one time daily. Qty 30 last fill 3.24.13

## 2011-12-16 ENCOUNTER — Other Ambulatory Visit: Payer: Self-pay | Admitting: *Deleted

## 2011-12-16 MED ORDER — FLUTICASONE PROPIONATE 50 MCG/ACT NA SUSP: 2.0000 | Freq: Every day | NASAL | Status: DC | PRN

## 2011-12-23 DIAGNOSIS — M23319 Other meniscus derangements, anterior horn of medial meniscus, unspecified knee: Secondary | ICD-10-CM | POA: Diagnosis not present

## 2011-12-30 DIAGNOSIS — M999 Biomechanical lesion, unspecified: Secondary | ICD-10-CM | POA: Diagnosis not present

## 2011-12-30 DIAGNOSIS — M5030 Other cervical disc degeneration, unspecified cervical region: Secondary | ICD-10-CM | POA: Diagnosis not present

## 2011-12-30 DIAGNOSIS — IMO0002 Reserved for concepts with insufficient information to code with codable children: Secondary | ICD-10-CM | POA: Diagnosis not present

## 2011-12-30 DIAGNOSIS — M9981 Other biomechanical lesions of cervical region: Secondary | ICD-10-CM | POA: Diagnosis not present

## 2012-01-01 DIAGNOSIS — IMO0002 Reserved for concepts with insufficient information to code with codable children: Secondary | ICD-10-CM | POA: Diagnosis not present

## 2012-01-01 DIAGNOSIS — M9981 Other biomechanical lesions of cervical region: Secondary | ICD-10-CM | POA: Diagnosis not present

## 2012-01-01 DIAGNOSIS — M5030 Other cervical disc degeneration, unspecified cervical region: Secondary | ICD-10-CM | POA: Diagnosis not present

## 2012-01-01 DIAGNOSIS — M999 Biomechanical lesion, unspecified: Secondary | ICD-10-CM | POA: Diagnosis not present

## 2012-01-02 ENCOUNTER — Encounter: Payer: Self-pay | Admitting: Cardiology

## 2012-01-02 ENCOUNTER — Ambulatory Visit (INDEPENDENT_AMBULATORY_CARE_PROVIDER_SITE_OTHER): Payer: Medicare Other | Admitting: Cardiology

## 2012-01-02 VITALS — BP 118/58 | HR 84 | Ht 71.0 in | Wt 303.0 lb

## 2012-01-02 DIAGNOSIS — Z87898 Personal history of other specified conditions: Secondary | ICD-10-CM

## 2012-01-02 DIAGNOSIS — I10 Essential (primary) hypertension: Secondary | ICD-10-CM | POA: Diagnosis not present

## 2012-01-02 DIAGNOSIS — E785 Hyperlipidemia, unspecified: Secondary | ICD-10-CM | POA: Diagnosis not present

## 2012-01-02 DIAGNOSIS — E119 Type 2 diabetes mellitus without complications: Secondary | ICD-10-CM | POA: Diagnosis not present

## 2012-01-02 DIAGNOSIS — I2510 Atherosclerotic heart disease of native coronary artery without angina pectoris: Secondary | ICD-10-CM

## 2012-01-02 NOTE — Assessment & Plan Note (Signed)
Much improved with 35 pound weight loss. Encouraged to keep off and on for lose more.

## 2012-01-02 NOTE — Assessment & Plan Note (Signed)
We have talked about the Accelerate Trial. He would like to talk to Dr. Artist Pais about this. He did have a significant impact on raising his HDL. He will remain on simvastatin.

## 2012-01-02 NOTE — Progress Notes (Signed)
HPI Jacob Bishop comes in today for evaluation and management of his history of coronary artery disease, mixed hyperlipidemia with low HDL, obesity, obstructive sleep apnea, and chronic lower extremity edema.  He has lost 35 pounds. He feels good with no significant chest discomfort, angina, or dyspnea on exertion. His edema has improved as has his mobility.  He is compliant with his meds.  Past Medical History  Diagnosis Date  . Hypertension   . Diabetes mellitus     type II  . CAD in native artery   . Hyperlipidemia   . Edema   . Obesity   . OSA (obstructive sleep apnea)   . Cellulitis   . Dermatitis   . Rhinosinusitis   . Skin lesion   . Positional vertigo   . Hx of colonic polyps     Current Outpatient Prescriptions  Medication Sig Dispense Refill  . amLODipine (NORVASC) 5 MG tablet Take 1/2 tablet by mouth once daily  30 tablet  5  . aspirin 81 MG tablet Take 81 mg by mouth daily.        . fish oil-omega-3 fatty acids 1000 MG capsule Take 1 g by mouth daily.        . fluticasone (FLONASE) 50 MCG/ACT nasal spray Place 2 sprays into the nose daily as needed.  16 g  3  . glucose blood (FREESTYLE LITE) test strip Use to test blood sugar once daily as instructed       . saxagliptin HCl (ONGLYZA) 5 MG TABS tablet Take 1 tablet (5 mg total) by mouth daily.  30 tablet  3  . simvastatin (ZOCOR) 20 MG tablet Take 1 tablet (20 mg total) by mouth at bedtime.  30 tablet  3  . Tamsulosin HCl (FLOMAX) 0.4 MG CAPS Take 0.4 mg by mouth daily.        . VESICARE 10 MG tablet Take 1 tablet by mouth Daily.        Allergies  Allergen Reactions  . Lipitor (Atorvastatin Calcium) Other (See Comments)    Muscle soreness    Family History  Problem Relation Age of Onset  . Cancer Mother   . Heart disease Father   . Heart disease Sister     History   Social History  . Marital Status: Married    Spouse Name: N/A    Number of Children: N/A  . Years of Education: N/A   Occupational History   . OWNER, restaurant    Social History Main Topics  . Smoking status: Former Smoker    Quit date: 01/10/1996  . Smokeless tobacco: Not on file  . Alcohol Use: Not on file  . Drug Use: Not on file  . Sexually Active: Not on file   Other Topics Concern  . Not on file   Social History Narrative   Grew up in [place]. Mother was [place], Father [place].    ROS ALL NEGATIVE EXCEPT THOSE NOTED IN HPI  PE  General Appearance: well developed, well nourished in no acute distress, obese but has lost substantial weight HEENT: symmetrical face, PERRLA, good dentition  Neck: no JVD, thyromegaly, or adenopathy, trachea midline Chest: symmetric without deformity Cardiac: PMI non-displaced, RRR, normal S1, S2, no gallop or murmur Lung: clear to ausculation and percussion Vascular: all pulses full without bruits  Abdominal: nondistended, nontender, good bowel sounds, no HSM, no bruits Extremities: no cyanosis, clubbing , edema particular right lower [place] with some minimal pitting., no sign of DVT, no varicosities  Skin: normal color, no rashes Neuro: alert and oriented x 3, non-focal Pysch: normal affect  EKG Normal sinus rhythm, no acute changes. BMET    Component Value Date/Time   NA 141 06/04/2011 0814   K 4.4 06/04/2011 0814   CL 105 06/04/2011 0814   CO2 30 06/04/2011 0814   GLUCOSE 134* 06/04/2011 0814   BUN 16 06/04/2011 0814   CREATININE 0.8 06/04/2011 0814   CALCIUM 8.9 06/04/2011 0814   GFRNONAA 127.96 07/30/2010 0913   GFRAA 146 05/17/2008 0810    Lipid Panel     Component Value Date/Time   CHOL 155 12/06/2010 0800   TRIG 105.0 12/06/2010 0800   HDL 35.20* 12/06/2010 0800   CHOLHDL 4 12/06/2010 0800   VLDL 21.0 12/06/2010 0800   LDLCALC 99 12/06/2010 0800    CBC No results found for this basename: wbc, rbc, hgb, hct, plt, mcv, mch, mchc, rdw, neutrabs, lymphsabs, monoabs, eosabs, basosabs

## 2012-01-02 NOTE — Assessment & Plan Note (Signed)
Stable. Continue aggressive secondary preventative therapy. 

## 2012-01-02 NOTE — Assessment & Plan Note (Signed)
Good control. No change in treatment. 

## 2012-01-02 NOTE — Patient Instructions (Signed)
Your physician recommends that you continue on your current medications as directed. Please refer to the Current Medication list given to you today.  Your physician wants you to follow-up in: 1 year. You will receive a reminder letter in the mail two months in advance. If you don't receive a letter, please call our office to schedule the follow-up appointment.  Your physician discussed the importance of regular exercise and recommended that you start or continue a regular exercise program for good health.  Your physician encouraged you to lose weight for better health.

## 2012-01-05 DIAGNOSIS — M999 Biomechanical lesion, unspecified: Secondary | ICD-10-CM | POA: Diagnosis not present

## 2012-01-05 DIAGNOSIS — M5030 Other cervical disc degeneration, unspecified cervical region: Secondary | ICD-10-CM | POA: Diagnosis not present

## 2012-01-05 DIAGNOSIS — IMO0002 Reserved for concepts with insufficient information to code with codable children: Secondary | ICD-10-CM | POA: Diagnosis not present

## 2012-01-05 DIAGNOSIS — M9981 Other biomechanical lesions of cervical region: Secondary | ICD-10-CM | POA: Diagnosis not present

## 2012-01-06 DIAGNOSIS — IMO0002 Reserved for concepts with insufficient information to code with codable children: Secondary | ICD-10-CM | POA: Diagnosis not present

## 2012-01-06 DIAGNOSIS — M999 Biomechanical lesion, unspecified: Secondary | ICD-10-CM | POA: Diagnosis not present

## 2012-01-06 DIAGNOSIS — M9981 Other biomechanical lesions of cervical region: Secondary | ICD-10-CM | POA: Diagnosis not present

## 2012-01-06 DIAGNOSIS — M5030 Other cervical disc degeneration, unspecified cervical region: Secondary | ICD-10-CM | POA: Diagnosis not present

## 2012-01-07 ENCOUNTER — Ambulatory Visit: Payer: Medicare Other | Admitting: Internal Medicine

## 2012-01-08 DIAGNOSIS — M999 Biomechanical lesion, unspecified: Secondary | ICD-10-CM | POA: Diagnosis not present

## 2012-01-08 DIAGNOSIS — M9981 Other biomechanical lesions of cervical region: Secondary | ICD-10-CM | POA: Diagnosis not present

## 2012-01-08 DIAGNOSIS — IMO0002 Reserved for concepts with insufficient information to code with codable children: Secondary | ICD-10-CM | POA: Diagnosis not present

## 2012-01-08 DIAGNOSIS — M5030 Other cervical disc degeneration, unspecified cervical region: Secondary | ICD-10-CM | POA: Diagnosis not present

## 2012-01-12 DIAGNOSIS — M9981 Other biomechanical lesions of cervical region: Secondary | ICD-10-CM | POA: Diagnosis not present

## 2012-01-12 DIAGNOSIS — IMO0002 Reserved for concepts with insufficient information to code with codable children: Secondary | ICD-10-CM | POA: Diagnosis not present

## 2012-01-12 DIAGNOSIS — M5030 Other cervical disc degeneration, unspecified cervical region: Secondary | ICD-10-CM | POA: Diagnosis not present

## 2012-01-12 DIAGNOSIS — M999 Biomechanical lesion, unspecified: Secondary | ICD-10-CM | POA: Diagnosis not present

## 2012-01-13 DIAGNOSIS — M999 Biomechanical lesion, unspecified: Secondary | ICD-10-CM | POA: Diagnosis not present

## 2012-01-13 DIAGNOSIS — M9981 Other biomechanical lesions of cervical region: Secondary | ICD-10-CM | POA: Diagnosis not present

## 2012-01-13 DIAGNOSIS — IMO0002 Reserved for concepts with insufficient information to code with codable children: Secondary | ICD-10-CM | POA: Diagnosis not present

## 2012-01-13 DIAGNOSIS — M5030 Other cervical disc degeneration, unspecified cervical region: Secondary | ICD-10-CM | POA: Diagnosis not present

## 2012-01-14 ENCOUNTER — Other Ambulatory Visit (INDEPENDENT_AMBULATORY_CARE_PROVIDER_SITE_OTHER): Payer: Medicare Other

## 2012-01-14 ENCOUNTER — Other Ambulatory Visit: Payer: Self-pay | Admitting: Internal Medicine

## 2012-01-14 DIAGNOSIS — E785 Hyperlipidemia, unspecified: Secondary | ICD-10-CM | POA: Diagnosis not present

## 2012-01-14 DIAGNOSIS — E119 Type 2 diabetes mellitus without complications: Secondary | ICD-10-CM

## 2012-01-14 LAB — LIPID PANEL
HDL: 34.2 mg/dL — ABNORMAL LOW (ref >39.00)
LDL Cholesterol: 79 mg/dL (ref 0–99)
Total CHOL/HDL Ratio: 4
Triglycerides: 134 mg/dL (ref 0.0–149.0)

## 2012-01-14 LAB — BASIC METABOLIC PANEL
BUN: 13 mg/dL (ref 6–23)
CO2: 26 mEq/L (ref 19–32)
Glucose, Bld: 166 mg/dL — ABNORMAL HIGH (ref 70–99)
Potassium: 4.1 mEq/L (ref 3.5–5.1)
Sodium: 141 mEq/L (ref 135–145)

## 2012-01-14 LAB — HEPATIC FUNCTION PANEL: Albumin: 3.9 g/dL (ref 3.5–5.2)

## 2012-01-15 DIAGNOSIS — IMO0002 Reserved for concepts with insufficient information to code with codable children: Secondary | ICD-10-CM | POA: Diagnosis not present

## 2012-01-15 DIAGNOSIS — M5030 Other cervical disc degeneration, unspecified cervical region: Secondary | ICD-10-CM | POA: Diagnosis not present

## 2012-01-15 DIAGNOSIS — M999 Biomechanical lesion, unspecified: Secondary | ICD-10-CM | POA: Diagnosis not present

## 2012-01-15 DIAGNOSIS — M9981 Other biomechanical lesions of cervical region: Secondary | ICD-10-CM | POA: Diagnosis not present

## 2012-01-20 DIAGNOSIS — IMO0002 Reserved for concepts with insufficient information to code with codable children: Secondary | ICD-10-CM | POA: Diagnosis not present

## 2012-01-20 DIAGNOSIS — M9981 Other biomechanical lesions of cervical region: Secondary | ICD-10-CM | POA: Diagnosis not present

## 2012-01-20 DIAGNOSIS — M5030 Other cervical disc degeneration, unspecified cervical region: Secondary | ICD-10-CM | POA: Diagnosis not present

## 2012-01-20 DIAGNOSIS — M999 Biomechanical lesion, unspecified: Secondary | ICD-10-CM | POA: Diagnosis not present

## 2012-01-21 DIAGNOSIS — M999 Biomechanical lesion, unspecified: Secondary | ICD-10-CM | POA: Diagnosis not present

## 2012-01-21 DIAGNOSIS — IMO0002 Reserved for concepts with insufficient information to code with codable children: Secondary | ICD-10-CM | POA: Diagnosis not present

## 2012-01-21 DIAGNOSIS — M9981 Other biomechanical lesions of cervical region: Secondary | ICD-10-CM | POA: Diagnosis not present

## 2012-01-21 DIAGNOSIS — M5030 Other cervical disc degeneration, unspecified cervical region: Secondary | ICD-10-CM | POA: Diagnosis not present

## 2012-01-22 ENCOUNTER — Encounter: Payer: Self-pay | Admitting: Internal Medicine

## 2012-01-22 ENCOUNTER — Ambulatory Visit (INDEPENDENT_AMBULATORY_CARE_PROVIDER_SITE_OTHER): Payer: Medicare Other | Admitting: Internal Medicine

## 2012-01-22 VITALS — BP 128/72 | HR 88 | Temp 97.5°F | Wt 303.0 lb

## 2012-01-22 DIAGNOSIS — E119 Type 2 diabetes mellitus without complications: Secondary | ICD-10-CM

## 2012-01-22 DIAGNOSIS — E785 Hyperlipidemia, unspecified: Secondary | ICD-10-CM

## 2012-01-22 DIAGNOSIS — I10 Essential (primary) hypertension: Secondary | ICD-10-CM

## 2012-01-22 DIAGNOSIS — IMO0001 Reserved for inherently not codable concepts without codable children: Secondary | ICD-10-CM

## 2012-01-22 DIAGNOSIS — M791 Myalgia, unspecified site: Secondary | ICD-10-CM | POA: Insufficient documentation

## 2012-01-22 DIAGNOSIS — M1711 Unilateral primary osteoarthritis, right knee: Secondary | ICD-10-CM | POA: Insufficient documentation

## 2012-01-22 DIAGNOSIS — M1710 Unilateral primary osteoarthritis, unspecified knee: Secondary | ICD-10-CM | POA: Diagnosis not present

## 2012-01-22 LAB — CK: Total CK: 66 U/L (ref 7–232)

## 2012-01-22 NOTE — Progress Notes (Signed)
Subjective:    Patient ID: Jacob Bishop, male    DOB: [birth year], 71 y.o.   MRN: [id number]  HPI  71 year old white male with history of coronary artery disease, morbid obesity, type 2 diabetes for routine followup. Patient reports poor dietary compliance. His A1c has got worse to 8.1. He is having increasing difficulty ambulating because of chronic knee pain right greater than left. Patient also had significant difficulty tolerating Lipitor in the past because it caused muscle pain and weakness. He is currently on simvastatin and reports mild muscle weakness. He has intermittent hip pain.  He has enrolled in a new study for a medication that raises HDL.   Review of Systems Negative for chest pain or shortness of breath    Past Medical History  Diagnosis Date  . Hypertension   . Diabetes mellitus     type II  . CAD in native artery   . Hyperlipidemia   . Edema   . Obesity   . OSA (obstructive sleep apnea)   . Cellulitis   . Dermatitis   . Rhinosinusitis   . Skin lesion   . Positional vertigo   . Hx of colonic polyps     History   Social History  . Marital Status: Married    Spouse Name: N/A    Number of Children: N/A  . Years of Education: N/A   Occupational History  . OWNER, restaurant    Social History Main Topics  . Smoking status: Former Smoker    Quit date: 01/10/1996  . Smokeless tobacco: Not on file  . Alcohol Use: Not on file  . Drug Use: Not on file  . Sexually Active: Not on file   Other Topics Concern  . Not on file   Social History Narrative   Grew up in [place]. Mother was [place], Father [place].    Past Surgical History  Procedure Date  . Cardiac catheterization     Family History  Problem Relation Age of Onset  . Cancer Mother   . Heart disease Father   . Heart disease Sister     Allergies  Allergen Reactions  . Lipitor (Atorvastatin Calcium) Other (See Comments)    Muscle soreness    Current Outpatient Prescriptions  on File Prior to Visit  Medication Sig Dispense Refill  . amLODipine (NORVASC) 5 MG tablet Take 1/2 tablet by mouth once daily  30 tablet  5  . aspirin 81 MG tablet Take 81 mg by mouth daily.        . fish oil-omega-3 fatty acids 1000 MG capsule Take 1 g by mouth daily.        . fluticasone (FLONASE) 50 MCG/ACT nasal spray Place 2 sprays into the nose daily as needed.  16 g  3  . saxagliptin HCl (ONGLYZA) 5 MG TABS tablet Take 1 tablet (5 mg total) by mouth daily.  30 tablet  3  . simvastatin (ZOCOR) 20 MG tablet Take 1 tablet (20 mg total) by mouth at bedtime.  30 tablet  3  . Tamsulosin HCl (FLOMAX) 0.4 MG CAPS Take 0.4 mg by mouth daily.        . VESICARE 10 MG tablet Take 1 tablet by mouth Daily.        BP 128/72  Pulse 88  Temp(Src) 97.5 F (36.4 C) (Oral)  Wt 303 lb (137.44 kg)    Objective:   Physical Exam  Constitutional: He is oriented to person, place, and time. He appears well-developed  and well-nourished.  Cardiovascular: Normal rate, regular rhythm and normal heart sounds.   Pulmonary/Chest: Effort normal and breath sounds normal. He has no wheezes.  Neurological: He is alert and oriented to person, place, and time.  Psychiatric: He has a normal mood and affect. His behavior is normal.       Assessment & Plan:

## 2012-01-22 NOTE — Patient Instructions (Signed)
Please complete the following lab tests before your next follow up appointment:  BMET, A1c - 250.02 

## 2012-01-22 NOTE — Assessment & Plan Note (Addendum)
Deteriorated mainly due to poor dietary compliance. Considering morbid obesity and multiple comorbidities, patient advised to consider gastric lab band surgery.  Continue onglyza.   Patient intolerant of metformin.

## 2012-01-22 NOTE — Assessment & Plan Note (Signed)
Patient having progressive worsening of osteoarthritis of his knees. Right greater than left. Patient to consider referral to orthopedic specialist for Synvisc injections.

## 2012-01-22 NOTE — Assessment & Plan Note (Signed)
Patient experienced significant pain and muscle weakness in his lower extremities after taking Lipitor. Patient having milder symptoms on simvastatin. Check CPK.

## 2012-01-22 NOTE — Assessment & Plan Note (Signed)
Well controlled.  No change in medication. BP: 128/72 mmHg

## 2012-01-22 NOTE — Assessment & Plan Note (Signed)
LDL near goal.  Patient also taking over-the-counter fish oil supplement. Patient advised to increase to 2 capsules twice daily. He has elevated transaminases. This may be due to statin use and or fatty liver. Continue to monitor liver enzymes. We discussed repeating liver ultrasound within one year.

## 2012-01-27 DIAGNOSIS — M5030 Other cervical disc degeneration, unspecified cervical region: Secondary | ICD-10-CM | POA: Diagnosis not present

## 2012-01-27 DIAGNOSIS — M999 Biomechanical lesion, unspecified: Secondary | ICD-10-CM | POA: Diagnosis not present

## 2012-01-27 DIAGNOSIS — M23319 Other meniscus derangements, anterior horn of medial meniscus, unspecified knee: Secondary | ICD-10-CM | POA: Diagnosis not present

## 2012-01-27 DIAGNOSIS — M9981 Other biomechanical lesions of cervical region: Secondary | ICD-10-CM | POA: Diagnosis not present

## 2012-01-29 DIAGNOSIS — M9981 Other biomechanical lesions of cervical region: Secondary | ICD-10-CM | POA: Diagnosis not present

## 2012-01-29 DIAGNOSIS — M999 Biomechanical lesion, unspecified: Secondary | ICD-10-CM | POA: Diagnosis not present

## 2012-01-29 DIAGNOSIS — M5030 Other cervical disc degeneration, unspecified cervical region: Secondary | ICD-10-CM | POA: Diagnosis not present

## 2012-01-29 DIAGNOSIS — IMO0002 Reserved for concepts with insufficient information to code with codable children: Secondary | ICD-10-CM | POA: Diagnosis not present

## 2012-02-04 DIAGNOSIS — M999 Biomechanical lesion, unspecified: Secondary | ICD-10-CM | POA: Diagnosis not present

## 2012-02-04 DIAGNOSIS — M9981 Other biomechanical lesions of cervical region: Secondary | ICD-10-CM | POA: Diagnosis not present

## 2012-02-04 DIAGNOSIS — M23319 Other meniscus derangements, anterior horn of medial meniscus, unspecified knee: Secondary | ICD-10-CM | POA: Diagnosis not present

## 2012-02-04 DIAGNOSIS — M5030 Other cervical disc degeneration, unspecified cervical region: Secondary | ICD-10-CM | POA: Diagnosis not present

## 2012-02-11 DIAGNOSIS — M9981 Other biomechanical lesions of cervical region: Secondary | ICD-10-CM | POA: Diagnosis not present

## 2012-02-11 DIAGNOSIS — IMO0002 Reserved for concepts with insufficient information to code with codable children: Secondary | ICD-10-CM | POA: Diagnosis not present

## 2012-02-11 DIAGNOSIS — M999 Biomechanical lesion, unspecified: Secondary | ICD-10-CM | POA: Diagnosis not present

## 2012-02-11 DIAGNOSIS — M5030 Other cervical disc degeneration, unspecified cervical region: Secondary | ICD-10-CM | POA: Diagnosis not present

## 2012-02-19 ENCOUNTER — Other Ambulatory Visit: Payer: Self-pay | Admitting: Internal Medicine

## 2012-02-19 DIAGNOSIS — M999 Biomechanical lesion, unspecified: Secondary | ICD-10-CM | POA: Diagnosis not present

## 2012-02-19 DIAGNOSIS — M5030 Other cervical disc degeneration, unspecified cervical region: Secondary | ICD-10-CM | POA: Diagnosis not present

## 2012-02-19 DIAGNOSIS — M9981 Other biomechanical lesions of cervical region: Secondary | ICD-10-CM | POA: Diagnosis not present

## 2012-02-19 DIAGNOSIS — IMO0002 Reserved for concepts with insufficient information to code with codable children: Secondary | ICD-10-CM | POA: Diagnosis not present

## 2012-03-02 DIAGNOSIS — M5030 Other cervical disc degeneration, unspecified cervical region: Secondary | ICD-10-CM | POA: Diagnosis not present

## 2012-03-02 DIAGNOSIS — M999 Biomechanical lesion, unspecified: Secondary | ICD-10-CM | POA: Diagnosis not present

## 2012-03-02 DIAGNOSIS — M9981 Other biomechanical lesions of cervical region: Secondary | ICD-10-CM | POA: Diagnosis not present

## 2012-03-02 DIAGNOSIS — IMO0002 Reserved for concepts with insufficient information to code with codable children: Secondary | ICD-10-CM | POA: Diagnosis not present

## 2012-03-22 ENCOUNTER — Encounter: Payer: Self-pay | Admitting: Internal Medicine

## 2012-03-22 ENCOUNTER — Ambulatory Visit (INDEPENDENT_AMBULATORY_CARE_PROVIDER_SITE_OTHER): Payer: Medicare Other | Admitting: Internal Medicine

## 2012-03-22 VITALS — BP 112/70 | HR 96 | Temp 98.0°F | Wt 301.0 lb

## 2012-03-22 DIAGNOSIS — E119 Type 2 diabetes mellitus without complications: Secondary | ICD-10-CM | POA: Diagnosis not present

## 2012-03-22 DIAGNOSIS — E785 Hyperlipidemia, unspecified: Secondary | ICD-10-CM | POA: Diagnosis not present

## 2012-03-22 MED ORDER — AMLODIPINE BESYLATE 5 MG PO TABS: ORAL_TABLET | ORAL | Status: DC

## 2012-03-22 MED ORDER — SAXAGLIPTIN HCL 5 MG PO TABS: 5.0000 mg | ORAL_TABLET | Freq: Every day | ORAL | Status: DC

## 2012-03-22 MED ORDER — SIMVASTATIN 20 MG PO TABS: 20.0000 mg | ORAL_TABLET | Freq: Every day | ORAL | Status: DC

## 2012-03-22 NOTE — Assessment & Plan Note (Signed)
Patient understands he he may progress to requiring insulin therapy his blood sugar control continues to worsen. Repeat A1c.  He reports his last eye exam was less than a year ago. Reports negative for diabetic retinopathy.

## 2012-03-22 NOTE — Assessment & Plan Note (Signed)
Patient continues to complain of lower extremity muscle weakness and pain despite changing from atorvastatin to simvastatin.   Patient advised to hold simvastatin for 2-4 weeks. Patient is to report whether his symptoms improve.   Previous CPK was normal.

## 2012-03-22 NOTE — Progress Notes (Signed)
Subjective:    Patient ID: Jacob Bishop, male    DOB: May 25, 1941, 71 y.o.   MRN: [id number]  HPI  71 year old white male with history of morbid obesity, type 2 diabetes , hypertension and hyperlipidemia for routine followup. Despite switching from atorvastatin to simvastatin, patient still struggling with leg weakness and discomfort. Previous CPK was normal. He is not sure whether his leg symptoms are from cholesterol medication versus osteoarthritis of his knees.  Type 2 diabetes- he is trying his best to maintain himself on carb modified diet. He is not always successful. He is not monitoring his blood sugar at home. His last A1c was 8.1. He denies polyuria or polydipsia.  Lab Results  Component Value Date   HGBA1C 8.1* 01/14/2012     Review of Systems   negative for chest pain , he describes poor exercise tolerance  Past Medical History  Diagnosis Date  . Hypertension   . Diabetes mellitus     type II  . CAD in native artery   . Hyperlipidemia   . Edema   . Obesity   . OSA (obstructive sleep apnea)   . Cellulitis   . Dermatitis   . Rhinosinusitis   . Skin lesion   . Positional vertigo   . Hx of colonic polyps     History   Social History  . Marital Status: Married    Spouse Name: N/A    Number of Children: N/A  . Years of Education: N/A   Occupational History  . OWNER, restaurant    Social History Main Topics  . Smoking status: Former Smoker    Quit date: 01/10/1996  . Smokeless tobacco: Not on file  . Alcohol Use: Not on file  . Drug Use: Not on file  . Sexually Active: Not on file   Other Topics Concern  . Not on file   Social History Narrative   Grew up in [place]. Mother was [place], Father [place].    Past Surgical History  Procedure Date  . Cardiac catheterization     Family History  Problem Relation Age of Onset  . Cancer Mother   . Heart disease Father   . Heart disease Sister     Allergies  Allergen Reactions  . Lipitor  (Atorvastatin Calcium) Other (See Comments)    Muscle soreness    Current Outpatient Prescriptions on File Prior to Visit  Medication Sig Dispense Refill  . amLODipine (NORVASC) 5 MG tablet Take 1/2 tablet by mouth once daily  30 tablet  5  . aspirin 81 MG tablet Take 81 mg by mouth daily.        . fish oil-omega-3 fatty acids 1000 MG capsule Take 1 g by mouth daily.        . fluticasone (FLONASE) 50 MCG/ACT nasal spray Place 2 sprays into the nose daily as needed.  16 g  3  . saxagliptin HCl (ONGLYZA) 5 MG TABS tablet Take 1 tablet (5 mg total) by mouth daily.  30 tablet  3  . simvastatin (ZOCOR) 20 MG tablet TAKE ONE TABLET BY MOUTH NIGHTLY AT BEDTIME  30 tablet  2  . Tamsulosin HCl (FLOMAX) 0.4 MG CAPS Take 0.4 mg by mouth daily.        . VESICARE 10 MG tablet Take 1 tablet by mouth Daily.        BP 112/70  Pulse 96  Temp 98 F (36.7 C) (Oral)  Wt 301 lb (136.533 kg)  Objective:   Physical Exam  Constitutional: He appears well-developed and well-nourished.  Cardiovascular: Normal rate, regular rhythm and normal heart sounds.   Pulmonary/Chest: Breath sounds normal. He has no wheezes.  Musculoskeletal:        Trace lower extremity edema bilaterally  Skin: Skin is warm and dry.  Psychiatric: He has a normal mood and affect. His behavior is normal.          Assessment & Plan:

## 2012-03-22 NOTE — Patient Instructions (Addendum)
Please complete the following lab tests before your next follow up appointment:  BMET, A1c - 250.02 

## 2012-03-23 LAB — BASIC METABOLIC PANEL
BUN: 17 mg/dL (ref 6–23)
CO2: 26 mEq/L (ref 19–32)
Calcium: 8.9 mg/dL (ref 8.4–10.5)
GFR: 103.47 mL/min (ref >60.00)
Glucose, Bld: 192 mg/dL — ABNORMAL HIGH (ref 70–99)

## 2012-03-23 LAB — MICROALBUMIN / CREATININE URINE RATIO: Microalb Creat Ratio: 2.3 mg/g (ref 0.0–30.0)

## 2012-03-24 ENCOUNTER — Other Ambulatory Visit: Payer: Self-pay | Admitting: *Deleted

## 2012-03-24 DIAGNOSIS — E119 Type 2 diabetes mellitus without complications: Secondary | ICD-10-CM

## 2012-03-24 MED ORDER — EXENATIDE 5 MCG/0.02ML ~~LOC~~ SOPN: 5.0000 ug | PEN_INJECTOR | Freq: Two times a day (BID) | SUBCUTANEOUS | Status: DC

## 2012-04-26 ENCOUNTER — Other Ambulatory Visit: Payer: Self-pay | Admitting: Internal Medicine

## 2012-05-20 DIAGNOSIS — N200 Calculus of kidney: Secondary | ICD-10-CM | POA: Diagnosis not present

## 2012-05-20 DIAGNOSIS — N401 Enlarged prostate with lower urinary tract symptoms: Secondary | ICD-10-CM | POA: Diagnosis not present

## 2012-05-20 DIAGNOSIS — E291 Testicular hypofunction: Secondary | ICD-10-CM | POA: Diagnosis not present

## 2012-06-22 ENCOUNTER — Ambulatory Visit (INDEPENDENT_AMBULATORY_CARE_PROVIDER_SITE_OTHER): Payer: Medicare Other | Admitting: Internal Medicine

## 2012-06-22 ENCOUNTER — Encounter: Payer: Self-pay | Admitting: Internal Medicine

## 2012-06-22 VITALS — BP 132/74 | HR 88 | Temp 98.2°F | Wt 302.0 lb

## 2012-06-22 DIAGNOSIS — Z23 Encounter for immunization: Secondary | ICD-10-CM | POA: Diagnosis not present

## 2012-06-22 DIAGNOSIS — E119 Type 2 diabetes mellitus without complications: Secondary | ICD-10-CM | POA: Diagnosis not present

## 2012-06-22 DIAGNOSIS — I10 Essential (primary) hypertension: Secondary | ICD-10-CM | POA: Diagnosis not present

## 2012-06-22 MED ORDER — LIRAGLUTIDE 18 MG/3ML ~~LOC~~ SOLN: 1.2000 mg | Freq: Every day | SUBCUTANEOUS | Status: DC

## 2012-06-22 MED ORDER — METFORMIN HCL ER 500 MG PO TB24: 500.0000 mg | ORAL_TABLET | Freq: Every day | ORAL | Status: DC

## 2012-06-22 NOTE — Progress Notes (Signed)
Subjective:    Patient ID: Jacob Bishop, male    DOB: [birth year], 71 y.o.   MRN: [id number]  HPI  71 year old white male with history of type 2 diabetes, morbid obesity, hypertension and coronary artery disease for followup. Patient has not been monitoring his blood sugar at home. Patient thinks his blood sugars have been better because he has less polyuria. Blood sugars improved since using Byetta. However patient finds it difficult to inject himself twice daily.  Hypertension stable   Review of Systems Negative for chest pain or shortness of breath  Past Medical History  Diagnosis Date  . Hypertension   . Diabetes mellitus     type II  . CAD in native artery   . Hyperlipidemia   . Edema   . Obesity   . OSA (obstructive sleep apnea)   . Cellulitis   . Dermatitis   . Rhinosinusitis   . Skin lesion   . Positional vertigo   . Hx of colonic polyps     History   Social History  . Marital Status: Married    Spouse Name: N/A    Number of Children: N/A  . Years of Education: N/A   Occupational History  . OWNER, restaurant    Social History Main Topics  . Smoking status: Former Smoker    Quit date: 01/10/1996  . Smokeless tobacco: Not on file  . Alcohol Use: Not on file  . Drug Use: Not on file  . Sexually Active: Not on file   Other Topics Concern  . Not on file   Social History Narrative   Grew up in [place]. Mother was [place], Father [place].    Past Surgical History  Procedure Date  . Cardiac catheterization     Family History  Problem Relation Age of Onset  . Cancer Mother   . Heart disease Father   . Heart disease Sister     Allergies  Allergen Reactions  . Lipitor (Atorvastatin Calcium) Other (See Comments)    Muscle soreness    Current Outpatient Prescriptions on File Prior to Visit  Medication Sig Dispense Refill  . amLODipine (NORVASC) 5 MG tablet Take 1/2 tablet by mouth once daily  30 tablet  5  . aspirin 81 MG tablet Take 81  mg by mouth daily.        . fish oil-omega-3 fatty acids 1000 MG capsule Take 1 g by mouth daily.        . fluticasone (FLONASE) 50 MCG/ACT nasal spray Place 2 sprays into the nose daily as needed.  16 g  3  . simvastatin (ZOCOR) 20 MG tablet Take 1 tablet (20 mg total) by mouth at bedtime.  30 tablet  5  . Tamsulosin HCl (FLOMAX) 0.4 MG CAPS Take 0.4 mg by mouth daily.        . VESICARE 10 MG tablet Take 1 tablet by mouth Daily.      . Liraglutide (VICTOZA) 18 MG/3ML SOLN Inject 0.2 mLs (1.2 mg total) into the skin daily.  6 mL  5  . metFORMIN (GLUCOPHAGE-XR) 500 MG 24 hr tablet Take 1 tablet (500 mg total) by mouth daily with breakfast.  90 tablet  1    BP 132/74  Pulse 88  Temp 98.2 F (36.8 C) (Oral)  Wt 302 lb (136.986 kg)       Objective:   Physical Exam  Constitutional: He appears well-developed and well-nourished.  HENT:  Head: Normocephalic and atraumatic.  Cardiovascular: Normal rate,  regular rhythm and normal heart sounds.   Pulmonary/Chest: Effort normal and breath sounds normal. He has no wheezes.  Musculoskeletal: He exhibits no edema.  Neurological: No cranial nerve deficit.  Skin: Skin is warm and dry.  Psychiatric: He has a normal mood and affect. His behavior is normal.          Assessment & Plan:

## 2012-06-22 NOTE — Assessment & Plan Note (Signed)
Stable - continue amlodipine 5 mg.  BP: 132/74 mmHg

## 2012-06-22 NOTE — Assessment & Plan Note (Signed)
Patient reports less polyuria since using Byetta twice daily. However he has difficulty remembering to use injection twice daily. Change to Victoza 1.2 mg subcutaneous once daily.  Monitor A1c.  Trial of low dose metformin xr.  He had difficulty tolerating in the past due to GI side effects.

## 2012-06-23 LAB — BASIC METABOLIC PANEL
BUN: 16 mg/dL (ref 6–23)
CO2: 27 mEq/L (ref 19–32)
Chloride: 107 mEq/L (ref 96–112)
Glucose, Bld: 215 mg/dL — ABNORMAL HIGH (ref 70–99)
Potassium: 4 mEq/L (ref 3.5–5.1)

## 2012-08-24 ENCOUNTER — Encounter: Payer: Self-pay | Admitting: Internal Medicine

## 2012-08-24 ENCOUNTER — Ambulatory Visit (INDEPENDENT_AMBULATORY_CARE_PROVIDER_SITE_OTHER): Payer: Medicare Other | Admitting: Internal Medicine

## 2012-08-24 VITALS — BP 110/70 | HR 84 | Temp 98.1°F | Wt 302.0 lb

## 2012-08-24 DIAGNOSIS — E119 Type 2 diabetes mellitus without complications: Secondary | ICD-10-CM | POA: Diagnosis not present

## 2012-08-24 DIAGNOSIS — L439 Lichen planus, unspecified: Secondary | ICD-10-CM

## 2012-08-24 DIAGNOSIS — N3281 Overactive bladder: Secondary | ICD-10-CM

## 2012-08-24 DIAGNOSIS — B370 Candidal stomatitis: Secondary | ICD-10-CM | POA: Insufficient documentation

## 2012-08-24 DIAGNOSIS — N318 Other neuromuscular dysfunction of bladder: Secondary | ICD-10-CM | POA: Diagnosis not present

## 2012-08-24 MED ORDER — TRIAMCINOLONE ACETONIDE 0.1 % MT PSTE: PASTE | OROMUCOSAL | Status: AC

## 2012-08-24 MED ORDER — MIRABEGRON ER 25 MG PO TB24: 25.0000 mg | ORAL_TABLET | Freq: Every day | ORAL | Status: DC

## 2012-08-24 NOTE — Progress Notes (Signed)
Subjective:    Patient ID: Jacob Bishop, male    DOB: 1941-07-25, 71 y.o.   MRN: [id number]  HPI  71 year old white male with type 2 diabetes, hypertension and morbid obesity for follow up. Patient switched to Victoza. He is not having any side effects. Patient also tolerating metformin without difficulty. His weight is stable.  He has overactive bladder. He is currently on VESIcare 10 mg once daily. He complains of dry mouth and constipation.  Patient is also noticed white plaques on the inside of right plaques on right buccal mucosa.  Top layer seems to slough off.    Review of Systems Negative for chest pain or shortness of breath  Past Medical History  Diagnosis Date  . Hypertension   . Diabetes mellitus     type II  . CAD in native artery   . Hyperlipidemia   . Edema   . Obesity   . OSA (obstructive sleep apnea)   . Cellulitis   . Dermatitis   . Rhinosinusitis   . Skin lesion   . Positional vertigo   . Hx of colonic polyps     History   Social History  . Marital Status: Married    Spouse Name: N/A    Number of Children: N/A  . Years of Education: N/A   Occupational History  . OWNER, restaurant    Social History Main Topics  . Smoking status: Former Smoker    Quit date: 01/10/1996  . Smokeless tobacco: Not on file  . Alcohol Use: Not on file  . Drug Use: Not on file  . Sexually Active: Not on file   Other Topics Concern  . Not on file   Social History Narrative   Grew up in [place]. Mother was [place], Father [place].    Past Surgical History  Procedure Date  . Cardiac catheterization     Family History  Problem Relation Age of Onset  . Cancer Mother   . Heart disease Father   . Heart disease Sister     Allergies  Allergen Reactions  . Lipitor (Atorvastatin Calcium) Other (See Comments)    Muscle soreness    Current Outpatient Prescriptions on File Prior to Visit  Medication Sig Dispense Refill  . amLODipine (NORVASC) 5 MG  tablet Take 1/2 tablet by mouth once daily  30 tablet  5  . aspirin 81 MG tablet Take 81 mg by mouth daily.        . fish oil-omega-3 fatty acids 1000 MG capsule Take 1 g by mouth daily.        . fluticasone (FLONASE) 50 MCG/ACT nasal spray Place 2 sprays into the nose daily as needed.  16 g  3  . Liraglutide (VICTOZA) 18 MG/3ML SOLN Inject 0.2 mLs (1.2 mg total) into the skin daily.  6 mL  5  . metFORMIN (GLUCOPHAGE-XR) 500 MG 24 hr tablet Take 1 tablet (500 mg total) by mouth daily with breakfast.  90 tablet  1  . simvastatin (ZOCOR) 20 MG tablet Take 1 tablet (20 mg total) by mouth at bedtime.  30 tablet  5  . Tamsulosin HCl (FLOMAX) 0.4 MG CAPS Take 0.4 mg by mouth daily.        . mirabegron ER (MYRBETRIQ) 25 MG TB24 Take 1 tablet (25 mg total) by mouth daily.  30 tablet  5    BP 110/70  Pulse 84  Temp 98.1 F (36.7 C) (Oral)  Wt 302 lb (136.986 kg)  Objective:   Physical Exam  Constitutional: He is oriented to person, place, and time. He appears well-developed and well-nourished. No distress.  HENT:  Head: Normocephalic and atraumatic.  Mouth/Throat: Oropharynx is clear and moist.       Thin white film / plaque on right buccal mucosa, gums normal in appearance  Cardiovascular: Normal rate and regular rhythm.   Pulmonary/Chest: Effort normal and breath sounds normal. He has no wheezes.  Musculoskeletal:       Trace lower extremity edema bilaterally  Neurological: He is alert and oriented to person, place, and time.  Skin: Skin is warm and dry.  Psychiatric: He has a normal mood and affect. His behavior is normal.          Assessment & Plan:

## 2012-08-24 NOTE — Assessment & Plan Note (Signed)
Patient tolerating Victoza and metformin. Monitor A1c.

## 2012-08-24 NOTE — Assessment & Plan Note (Signed)
Patient unable to tolerate VESIcare due to side effects of dry mouth and constipation.  Switch to Myrbetriq.

## 2012-08-24 NOTE — Assessment & Plan Note (Signed)
Patient has thin white film/plaques on right buccal mucosa. I suspect lichen planus. Trial of triamcinolone paste.

## 2012-09-24 ENCOUNTER — Telehealth: Payer: Self-pay | Admitting: Internal Medicine

## 2012-09-24 NOTE — Telephone Encounter (Signed)
Pt would like samples of victoza and myrbetriq.

## 2012-09-24 NOTE — Telephone Encounter (Signed)
Samples are ready for p/u, pt aware 

## 2012-10-21 ENCOUNTER — Other Ambulatory Visit (INDEPENDENT_AMBULATORY_CARE_PROVIDER_SITE_OTHER): Payer: Medicare Other

## 2012-10-21 DIAGNOSIS — E1110 Type 2 diabetes mellitus with ketoacidosis without coma: Secondary | ICD-10-CM | POA: Diagnosis not present

## 2012-10-21 LAB — BASIC METABOLIC PANEL
BUN: 18 mg/dL (ref 6–23)
CO2: 26 mEq/L (ref 19–32)
Chloride: 106 mEq/L (ref 96–112)
Creatinine, Ser: 0.8 mg/dL (ref 0.4–1.5)
Glucose, Bld: 169 mg/dL — ABNORMAL HIGH (ref 70–99)

## 2012-10-28 ENCOUNTER — Ambulatory Visit: Payer: Medicare Other | Admitting: Internal Medicine

## 2012-11-03 ENCOUNTER — Encounter: Payer: Self-pay | Admitting: Internal Medicine

## 2012-11-03 ENCOUNTER — Ambulatory Visit (INDEPENDENT_AMBULATORY_CARE_PROVIDER_SITE_OTHER): Payer: Medicare Other | Admitting: Internal Medicine

## 2012-11-03 VITALS — BP 120/62 | HR 80 | Temp 98.1°F | Wt 302.0 lb

## 2012-11-03 DIAGNOSIS — B370 Candidal stomatitis: Secondary | ICD-10-CM | POA: Diagnosis not present

## 2012-11-03 DIAGNOSIS — Z0000 Encounter for general adult medical examination without abnormal findings: Secondary | ICD-10-CM

## 2012-11-03 DIAGNOSIS — R197 Diarrhea, unspecified: Secondary | ICD-10-CM | POA: Diagnosis not present

## 2012-11-03 DIAGNOSIS — E119 Type 2 diabetes mellitus without complications: Secondary | ICD-10-CM | POA: Diagnosis not present

## 2012-11-03 DIAGNOSIS — R110 Nausea: Secondary | ICD-10-CM | POA: Diagnosis not present

## 2012-11-03 MED ORDER — NYSTATIN 100000 UNIT/ML MT SUSP: 500000.0000 [IU] | Freq: Four times a day (QID) | OROMUCOSAL | Status: DC

## 2012-11-03 NOTE — Patient Instructions (Signed)
Hold metformin and Victoza for 1 week

## 2012-11-03 NOTE — Assessment & Plan Note (Signed)
Patient having intermittent diarrhea. His symptoms seem to be unrelated to metformin use. I doubt infectious etiology. However obtain stool studies for C. Difficile, stool culture, and stool lactoferrin.  Patient to use samples of Welchol as needed.

## 2012-11-03 NOTE — Assessment & Plan Note (Signed)
A1c has improved.  Patient experiencing intermittent nausea and mild abdominal pain. Rule out pancreatitis for Victoza. Check lipase.

## 2012-11-03 NOTE — Progress Notes (Signed)
Subjective:    Patient ID: Jacob Bishop, male    DOB: 04-Dec-1940, 72 y.o.   MRN: [id number]  HPI  72 year old white male with type 2 diabetes previously seen for overactive bladder and white plaques in the inside of his cheeks for followup. Patient reports intermittent diarrhea since previous visit. He is not sure whether his symptoms related to metformin. He is also experiencing intermittent nausea and mild abdominal pain. He can experience 2-3 bowel movements per day.  He denies recent antibiotic use.  Patient tried using triamcinolone paste for plaques in his mucosa. It did not seem to help. He still has top layer of skin that seemed to slough off the inside of his mouth.  Patient's wife diagnosed with colon cancer. He would like referral for repeat colonoscopy. Has been greater than 10 years since his last colonoscopy.   Review of Systems Negative vomiting, no melena  Past Medical History  Diagnosis Date  . Hypertension   . Diabetes mellitus     type II  . CAD in native artery   . Hyperlipidemia   . Edema   . Obesity   . OSA (obstructive sleep apnea)   . Cellulitis   . Dermatitis   . Rhinosinusitis   . Skin lesion   . Positional vertigo   . Hx of colonic polyps     History   Social History  . Marital Status: Married    Spouse Name: N/A    Number of Children: N/A  . Years of Education: N/A   Occupational History  . OWNER, restaurant    Social History Main Topics  . Smoking status: Former Smoker    Quit date: 01/10/1996  . Smokeless tobacco: Not on file  . Alcohol Use: Not on file  . Drug Use: Not on file  . Sexually Active: Not on file   Other Topics Concern  . Not on file   Social History Narrative   Grew up in [place]. Mother was [place], Father [place].    Past Surgical History  Procedure Laterality Date  . Cardiac catheterization      Family History  Problem Relation Age of Onset  . Cancer Mother   . Heart disease Father   . Heart  disease Sister     Allergies  Allergen Reactions  . Lipitor (Atorvastatin Calcium) Other (See Comments)    Muscle soreness    Current Outpatient Prescriptions on File Prior to Visit  Medication Sig Dispense Refill  . amLODipine (NORVASC) 5 MG tablet Take 1/2 tablet by mouth once daily  30 tablet  5  . aspirin 81 MG tablet Take 81 mg by mouth daily.        . fish oil-omega-3 fatty acids 1000 MG capsule Take 1 g by mouth daily.        . fluticasone (FLONASE) 50 MCG/ACT nasal spray Place 2 sprays into the nose daily as needed.  16 g  3  . Liraglutide (VICTOZA) 18 MG/3ML SOLN Inject 0.2 mLs (1.2 mg total) into the skin daily.  6 mL  5  . metFORMIN (GLUCOPHAGE-XR) 500 MG 24 hr tablet Take 1 tablet (500 mg total) by mouth daily with breakfast.  90 tablet  1  . mirabegron ER (MYRBETRIQ) 25 MG TB24 Take 1 tablet (25 mg total) by mouth daily.  30 tablet  5  . simvastatin (ZOCOR) 20 MG tablet Take 1 tablet (20 mg total) by mouth at bedtime.  30 tablet  5  . Tamsulosin HCl (  FLOMAX) 0.4 MG CAPS Take 0.4 mg by mouth daily.         No current facility-administered medications on file prior to visit.    BP 120/62  Pulse 80  Temp(Src) 98.1 F (36.7 C) (Oral)  Wt 302 lb (136.986 kg)  BMI 42.14 kg/m2       Objective:   Physical Exam  Constitutional: He is oriented to person, place, and time. He appears well-developed and well-nourished. No distress.  HENT:  Head: Normocephalic and atraumatic.  Eyes: EOM are normal. Pupils are equal, round, and reactive to light.  Cardiovascular: Normal rate, regular rhythm and normal heart sounds.   Pulmonary/Chest: Effort normal. He has no wheezes.  Abdominal: Soft.  Mild mid abdominal pain  Neurological: He is alert and oriented to person, place, and time. No cranial nerve deficit.  Psychiatric: He has a normal mood and affect. His behavior is normal.          Assessment & Plan:

## 2012-11-03 NOTE — Assessment & Plan Note (Signed)
Patient did not have any improvement with using triamcinolone paste. He has been using intranasal steroids. He has whitish film on tongue. His oral symptoms may be secondary to thrush. Trial of nystatin suspension.

## 2012-11-04 LAB — CBC WITH DIFFERENTIAL/PLATELET
Eosinophils Relative: 2.7 % (ref 0.0–5.0)
HCT: 39.7 % (ref 39.0–52.0)
Lymphs Abs: 2.2 10*3/uL (ref 0.7–4.0)
MCV: 92.7 fl (ref 78.0–100.0)
Monocytes Absolute: 0.6 10*3/uL (ref 0.1–1.0)
Platelets: 119 10*3/uL — ABNORMAL LOW (ref 150.0–400.0)
RDW: 13.7 % (ref 11.5–14.6)
WBC: 5.7 10*3/uL (ref 4.5–10.5)

## 2012-11-04 LAB — LIPASE: Lipase: 29 U/L (ref 11.0–59.0)

## 2012-11-08 ENCOUNTER — Encounter: Payer: Self-pay | Admitting: Internal Medicine

## 2012-11-09 LAB — CLOSTRIDIUM DIFFICILE BY PCR: Toxigenic C. Difficile by PCR: NOT DETECTED

## 2012-11-11 DIAGNOSIS — N138 Other obstructive and reflux uropathy: Secondary | ICD-10-CM | POA: Diagnosis not present

## 2012-11-11 DIAGNOSIS — E291 Testicular hypofunction: Secondary | ICD-10-CM | POA: Diagnosis not present

## 2012-11-11 DIAGNOSIS — N401 Enlarged prostate with lower urinary tract symptoms: Secondary | ICD-10-CM | POA: Diagnosis not present

## 2012-11-18 ENCOUNTER — Ambulatory Visit: Payer: Medicare Other | Admitting: Internal Medicine

## 2012-11-18 DIAGNOSIS — N401 Enlarged prostate with lower urinary tract symptoms: Secondary | ICD-10-CM | POA: Diagnosis not present

## 2012-11-18 DIAGNOSIS — N200 Calculus of kidney: Secondary | ICD-10-CM | POA: Diagnosis not present

## 2012-11-19 ENCOUNTER — Ambulatory Visit (INDEPENDENT_AMBULATORY_CARE_PROVIDER_SITE_OTHER): Payer: Medicare Other | Admitting: Internal Medicine

## 2012-11-19 ENCOUNTER — Encounter: Payer: Self-pay | Admitting: Internal Medicine

## 2012-11-19 VITALS — BP 112/60 | HR 82 | Temp 97.5°F | Wt 300.0 lb

## 2012-11-19 DIAGNOSIS — D696 Thrombocytopenia, unspecified: Secondary | ICD-10-CM | POA: Diagnosis not present

## 2012-11-19 DIAGNOSIS — R197 Diarrhea, unspecified: Secondary | ICD-10-CM | POA: Diagnosis not present

## 2012-11-19 NOTE — Patient Instructions (Addendum)
Please complete the following lab tests before your next follow up appointment: BMET, A1c - 250.02 CBCD - 287.5

## 2012-11-19 NOTE — Assessment & Plan Note (Addendum)
Patient has mild thrombocytopenia. He is asymptomatic. Repeat CBC in 3 months.

## 2012-11-19 NOTE — Assessment & Plan Note (Signed)
His symptoms resolved. Stool studies were negative. Diarrhea likely secondary to transient viral gastroenteritis.

## 2012-11-19 NOTE — Progress Notes (Signed)
Subjective:    Patient ID: Jacob Bishop, male    DOB: December 16, 1940, 72 y.o.   MRN: [id number]  HPI  72 year old white male with type 2 diabetes and hypertension previously seen for loose stools for followup. Patient's stool studies were negative. He reports his coworkers had similar symptoms. He thinks his symptoms may have been secondary to viral gastroenteritis.  Patient recently seen by his urologist-Dr. Isabel Caprice.  Patient having issues with urinary frequency. His Myrbetriq dose was increased to 50 mg.  He has chronic DJD of right knee. He would like to defer knee replacement for as long as possible. He is interested in Synvisc injections.  Review of Systems Negative for chest pain  Past Medical History  Diagnosis Date  . Hypertension   . Diabetes mellitus     type II  . CAD in native artery   . Hyperlipidemia   . Edema   . Obesity   . OSA (obstructive sleep apnea)   . Cellulitis   . Dermatitis   . Rhinosinusitis   . Skin lesion   . Positional vertigo   . Hx of colonic polyps     History   Social History  . Marital Status: Married    Spouse Name: N/A    Number of Children: N/A  . Years of Education: N/A   Occupational History  . OWNER, restaurant    Social History Main Topics  . Smoking status: Former Smoker    Quit date: 01/10/1996  . Smokeless tobacco: Not on file  . Alcohol Use: Not on file  . Drug Use: Not on file  . Sexually Active: Not on file   Other Topics Concern  . Not on file   Social History Narrative   Grew up in [place]. Mother was [place], Father [place].    Past Surgical History  Procedure Laterality Date  . Cardiac catheterization      Family History  Problem Relation Age of Onset  . Cancer Mother   . Heart disease Father   . Heart disease Sister     Allergies  Allergen Reactions  . Lipitor (Atorvastatin Calcium) Other (See Comments)    Muscle soreness    Current Outpatient Prescriptions on File Prior to Visit   Medication Sig Dispense Refill  . amLODipine (NORVASC) 5 MG tablet Take 1/2 tablet by mouth once daily  30 tablet  5  . aspirin 81 MG tablet Take 81 mg by mouth daily.        . fish oil-omega-3 fatty acids 1000 MG capsule Take 1 g by mouth daily.        . fluticasone (FLONASE) 50 MCG/ACT nasal spray Place 2 sprays into the nose daily as needed.  16 g  3  . Liraglutide (VICTOZA) 18 MG/3ML SOLN Inject 0.2 mLs (1.2 mg total) into the skin daily.  6 mL  5  . metFORMIN (GLUCOPHAGE-XR) 500 MG 24 hr tablet Take 1 tablet (500 mg total) by mouth daily with breakfast.  90 tablet  1  . mirabegron ER (MYRBETRIQ) 25 MG TB24 Take 1 tablet (25 mg total) by mouth daily.  30 tablet  5  . nystatin (MYCOSTATIN) 100000 UNIT/ML suspension Take 5 mLs (500,000 Units total) by mouth 4 (four) times daily.  60 mL  1  . simvastatin (ZOCOR) 20 MG tablet Take 1 tablet (20 mg total) by mouth at bedtime.  30 tablet  5  . Tamsulosin HCl (FLOMAX) 0.4 MG CAPS Take 0.4 mg by mouth daily.  No current facility-administered medications on file prior to visit.    BP 112/60  Pulse 82  Temp(Src) 97.5 F (36.4 C) (Oral)  Wt 300 lb (136.079 kg)  BMI 41.86 kg/m2       Objective:   Physical Exam  Constitutional: He is oriented to person, place, and time. He appears well-developed and well-nourished.  Cardiovascular: Normal rate, regular rhythm and normal heart sounds.   Pulmonary/Chest: Effort normal and breath sounds normal. He has no wheezes.  Abdominal: Soft. Bowel sounds are normal. He exhibits no mass.  Neurological: He is alert and oriented to person, place, and time. No cranial nerve deficit.          Assessment & Plan:

## 2012-11-25 DIAGNOSIS — H5200 Hypermetropia, unspecified eye: Secondary | ICD-10-CM | POA: Diagnosis not present

## 2012-11-25 DIAGNOSIS — H524 Presbyopia: Secondary | ICD-10-CM | POA: Diagnosis not present

## 2012-11-25 DIAGNOSIS — H2510 Age-related nuclear cataract, unspecified eye: Secondary | ICD-10-CM | POA: Diagnosis not present

## 2012-11-25 DIAGNOSIS — E119 Type 2 diabetes mellitus without complications: Secondary | ICD-10-CM | POA: Diagnosis not present

## 2012-11-25 LAB — HM DIABETES EYE EXAM

## 2012-11-30 ENCOUNTER — Encounter: Payer: Self-pay | Admitting: Internal Medicine

## 2012-11-30 ENCOUNTER — Ambulatory Visit (INDEPENDENT_AMBULATORY_CARE_PROVIDER_SITE_OTHER): Payer: Medicare Other | Admitting: Internal Medicine

## 2012-11-30 VITALS — BP 146/70 | Temp 98.8°F | Wt 301.0 lb

## 2012-11-30 DIAGNOSIS — N318 Other neuromuscular dysfunction of bladder: Secondary | ICD-10-CM

## 2012-11-30 DIAGNOSIS — M1710 Unilateral primary osteoarthritis, unspecified knee: Secondary | ICD-10-CM | POA: Diagnosis not present

## 2012-11-30 DIAGNOSIS — M1711 Unilateral primary osteoarthritis, right knee: Secondary | ICD-10-CM

## 2012-11-30 DIAGNOSIS — N3281 Overactive bladder: Secondary | ICD-10-CM

## 2012-11-30 NOTE — Progress Notes (Signed)
Subjective:    Patient ID: Jacob Bishop, male    DOB: [birth year], 72 y.o.   MRN: [id number]  HPI  72 year old white male with history of type 2 diabetes, obesity, hypertension and osteoarthritis of knees for followup. Patient reports chronic right knee pain getting worse. He senses grinding sensation when using his right leg. He denies any redness or swelling. It has been several years since right knee x-rayed  Review of Systems Negative for fever or chills  Past Medical History  Diagnosis Date  . Hypertension   . Diabetes mellitus     type II  . CAD in native artery   . Hyperlipidemia   . Edema   . Obesity   . OSA (obstructive sleep apnea)   . Cellulitis   . Dermatitis   . Rhinosinusitis   . Skin lesion   . Positional vertigo   . Hx of colonic polyps     History   Social History  . Marital Status: Married    Spouse Name: N/A    Number of Children: N/A  . Years of Education: N/A   Occupational History  . OWNER, restaurant    Social History Main Topics  . Smoking status: Former Smoker    Quit date: 01/10/1996  . Smokeless tobacco: Not on file  . Alcohol Use: Not on file  . Drug Use: Not on file  . Sexually Active: Not on file   Other Topics Concern  . Not on file   Social History Narrative   Grew up in [place]. Mother was [place], Father [place].    Past Surgical History  Procedure Laterality Date  . Cardiac catheterization      Family History  Problem Relation Age of Onset  . Cancer Mother   . Heart disease Father   . Heart disease Sister     Allergies  Allergen Reactions  . Lipitor (Atorvastatin Calcium) Other (See Comments)    Muscle soreness    Current Outpatient Prescriptions on File Prior to Visit  Medication Sig Dispense Refill  . amLODipine (NORVASC) 5 MG tablet Take 1/2 tablet by mouth once daily  30 tablet  5  . aspirin 81 MG tablet Take 81 mg by mouth daily.        . fish oil-omega-3 fatty acids 1000 MG capsule Take 1 g by  mouth daily.        . fluticasone (FLONASE) 50 MCG/ACT nasal spray Place 2 sprays into the nose daily as needed.  16 g  3  . Liraglutide (VICTOZA) 18 MG/3ML SOLN Inject 0.2 mLs (1.2 mg total) into the skin daily.  6 mL  5  . metFORMIN (GLUCOPHAGE-XR) 500 MG 24 hr tablet Take 1 tablet (500 mg total) by mouth daily with breakfast.  90 tablet  1  . mirabegron ER (MYRBETRIQ) 25 MG TB24 Take 1 tablet (25 mg total) by mouth daily.  30 tablet  5  . nystatin (MYCOSTATIN) 100000 UNIT/ML suspension Take 5 mLs (500,000 Units total) by mouth 4 (four) times daily.  60 mL  1  . simvastatin (ZOCOR) 20 MG tablet Take 1 tablet (20 mg total) by mouth at bedtime.  30 tablet  5  . Tamsulosin HCl (FLOMAX) 0.4 MG CAPS Take 0.4 mg by mouth daily.         No current facility-administered medications on file prior to visit.    BP 146/70  Temp(Src) 98.8 F (37.1 C) (Oral)  Wt 301 lb (136.533 kg)  BMI 42 kg/m2  Objective:   Physical Exam  Constitutional: He appears well-developed.  Cardiovascular: Normal rate, regular rhythm and normal heart sounds.   Pulmonary/Chest: Effort normal and breath sounds normal. He has no wheezes.  Musculoskeletal:  Right knee-mild crepitus with flexion and extension. No knee redness or swelling.          Assessment & Plan:

## 2012-11-30 NOTE — Assessment & Plan Note (Signed)
Patient is worsening degenerative joint disease of right knee. Repeat right knee x-ray. Cortisone injection may exacerbate his diabetes. We discussed risks and benefits of Synvisc injection. He agreed to proceed. Area prepped sterile fashion.  2 ml of Synvisc injected.  No complications.  Repeat weekly for total of 3 injections.

## 2012-11-30 NOTE — Assessment & Plan Note (Signed)
Myrbetriq titrated to 50 mg with good response.  Continue same dose.

## 2012-12-01 ENCOUNTER — Other Ambulatory Visit: Payer: Self-pay | Admitting: Internal Medicine

## 2012-12-07 ENCOUNTER — Ambulatory Visit: Payer: Medicare Other | Admitting: Internal Medicine

## 2012-12-14 ENCOUNTER — Ambulatory Visit (AMBULATORY_SURGERY_CENTER): Payer: Medicare Other

## 2012-12-14 ENCOUNTER — Ambulatory Visit: Payer: Medicare Other | Admitting: Internal Medicine

## 2012-12-14 VITALS — Ht 71.0 in | Wt 304.6 lb

## 2012-12-14 DIAGNOSIS — Z800 Family history of malignant neoplasm of digestive organs: Secondary | ICD-10-CM

## 2012-12-14 DIAGNOSIS — Z860100 Personal history of colon polyps, unspecified: Secondary | ICD-10-CM

## 2012-12-14 DIAGNOSIS — Z1211 Encounter for screening for malignant neoplasm of colon: Secondary | ICD-10-CM

## 2012-12-14 MED ORDER — MOVIPREP 100 G PO SOLR: ORAL | Status: DC

## 2012-12-15 ENCOUNTER — Ambulatory Visit: Payer: Medicare Other | Admitting: Internal Medicine

## 2012-12-20 ENCOUNTER — Ambulatory Visit (INDEPENDENT_AMBULATORY_CARE_PROVIDER_SITE_OTHER): Payer: Medicare Other | Admitting: Internal Medicine

## 2012-12-20 ENCOUNTER — Encounter: Payer: Self-pay | Admitting: Internal Medicine

## 2012-12-20 VITALS — BP 124/66 | Wt 304.0 lb

## 2012-12-20 DIAGNOSIS — M1711 Unilateral primary osteoarthritis, right knee: Secondary | ICD-10-CM

## 2012-12-20 DIAGNOSIS — M1710 Unilateral primary osteoarthritis, unspecified knee: Secondary | ICD-10-CM

## 2012-12-20 NOTE — Progress Notes (Signed)
Subjective:    Patient ID: Jacob Bishop, male    DOB: 1941/04/06, 72 y.o.   MRN: [id number]  HPI  72 year old white male for followup regarding chronic right knee osteoarthritis.  At previous visit patient received Synvisc injection. He notes mild improvement in right knee pain.  Review of Systems Negative for joint redness or swelling  Past Medical History  Diagnosis Date  . Hypertension   . Diabetes mellitus     type II  . CAD in native artery   . Hyperlipidemia   . Edema   . Obesity   . OSA (obstructive sleep apnea)   . Cellulitis   . Dermatitis   . Rhinosinusitis   . Skin lesion   . Positional vertigo   . Hx of colonic polyps   . History of kidney stones     History   Social History  . Marital Status: Married    Spouse Name: N/A    Number of Children: N/A  . Years of Education: N/A   Occupational History  . OWNER, restaurant    Social History Main Topics  . Smoking status: Former Smoker    Quit date: 01/10/1996  . Smokeless tobacco: Never Used  . Alcohol Use: No  . Drug Use: No  . Sexually Active: Not on file   Other Topics Concern  . Not on file   Social History Narrative   Grew up in [place]. Mother was [place], Father [place].    Past Surgical History  Procedure Laterality Date  . Cardiac catheterization    . Coronary stent placement      3 stents /1997  . Colonoscopy    . Polypectomy      Family History  Problem Relation Age of Onset  . Cancer Mother   . Heart disease Father   . Heart disease Sister   . Diabetes Sister   . Colon cancer Sister     Allergies  Allergen Reactions  . Lipitor (Atorvastatin Calcium) Other (See Comments)    Muscle soreness    Current Outpatient Prescriptions on File Prior to Visit  Medication Sig Dispense Refill  . amLODipine (NORVASC) 5 MG tablet Take 1/2 tablet by mouth once daily  30 tablet  5  . aspirin 81 MG tablet Take 81 mg by mouth daily.        . fish oil-omega-3 fatty acids 1000 MG  capsule Take 1 g by mouth daily.        . Liraglutide (VICTOZA) 18 MG/3ML SOLN Inject 0.2 mLs (1.2 mg total) into the skin daily.  6 mL  5  . metFORMIN (GLUCOPHAGE-XR) 500 MG 24 hr tablet Take 1 tablet (500 mg total) by mouth daily with breakfast.  90 tablet  1  . mirabegron ER (MYRBETRIQ) 25 MG TB24 Take 50 mg by mouth daily.      Marland Kitchen MOVIPREP 100 G SOLR Moviprep as directed / no substitutions  1 kit  0  . nystatin (MYCOSTATIN) 100000 UNIT/ML suspension Take 5 mLs (500,000 Units total) by mouth 4 (four) times daily.  60 mL  1  . simvastatin (ZOCOR) 20 MG tablet TAKE ONE TABLET BY MOUTH   NIGHTLY AT BEDTIME  30 tablet  5  . Tamsulosin HCl (FLOMAX) 0.4 MG CAPS Take 0.4 mg by mouth daily.        . fluticasone (FLONASE) 50 MCG/ACT nasal spray Place 2 sprays into the nose daily as needed.  16 g  3   No current facility-administered medications  on file prior to visit.    BP 124/66  Wt 304 lb (137.893 kg)  BMI 42.42 kg/m2       Objective:   Physical Exam  Constitutional: He appears well-developed and well-nourished.  Cardiovascular: Normal rate, regular rhythm and normal heart sounds.   Pulmonary/Chest: Effort normal. He has no wheezes.  Musculoskeletal:  No joint redness of edema          Assessment & Plan:

## 2012-12-20 NOTE — Assessment & Plan Note (Signed)
Patient tolerated his first Synvisc injection well. There was mild improvement. Patient presents for second injection. Area prepped with Betadine. Sterile technique maintained. 2 mL of Synvisc injected. No complications. Return in one week for third injection.

## 2012-12-28 ENCOUNTER — Encounter: Payer: Self-pay | Admitting: Internal Medicine

## 2012-12-28 ENCOUNTER — Ambulatory Visit (AMBULATORY_SURGERY_CENTER): Payer: Medicare Other | Admitting: Internal Medicine

## 2012-12-28 ENCOUNTER — Other Ambulatory Visit: Payer: Self-pay | Admitting: Internal Medicine

## 2012-12-28 VITALS — BP 122/75 | HR 69 | Temp 97.8°F | Resp 20 | Ht 71.0 in | Wt 304.0 lb

## 2012-12-28 DIAGNOSIS — D126 Benign neoplasm of colon, unspecified: Secondary | ICD-10-CM

## 2012-12-28 DIAGNOSIS — Z800 Family history of malignant neoplasm of digestive organs: Secondary | ICD-10-CM | POA: Diagnosis not present

## 2012-12-28 DIAGNOSIS — I10 Essential (primary) hypertension: Secondary | ICD-10-CM | POA: Diagnosis not present

## 2012-12-28 DIAGNOSIS — Z1211 Encounter for screening for malignant neoplasm of colon: Secondary | ICD-10-CM

## 2012-12-28 DIAGNOSIS — Z860100 Personal history of colon polyps, unspecified: Secondary | ICD-10-CM

## 2012-12-28 DIAGNOSIS — E119 Type 2 diabetes mellitus without complications: Secondary | ICD-10-CM | POA: Diagnosis not present

## 2012-12-28 MED ORDER — SODIUM CHLORIDE 0.9 % IV SOLN: 500.0000 mL | INTRAVENOUS | Status: DC

## 2012-12-28 NOTE — Patient Instructions (Addendum)

## 2012-12-28 NOTE — Progress Notes (Signed)
Called to room to assist during endoscopic procedure.  Patient ID and intended procedure confirmed with present staff. Received instructions for my participation in the procedure from the performing physician.  

## 2012-12-28 NOTE — Op Note (Signed)
[facility] [address] [place], [address]   COLONOSCOPY PROCEDURE REPORT  PATIENT: Jacob Bishop, Jacob Bishop  MR#: [id number] BIRTHDATE: Dec 30, 1940 , 71  yrs. old GENDER: Male ENDOSCOPIST: Beverley Fiedler, MD REFERRED BY: PROCEDURE DATE:  12/28/2012 PROCEDURE:   Colonoscopy with snare polypectomy, Colonoscopy with cold biopsy polypectomy, and Colonoscopy with biopsy ASA CLASS:   Class III INDICATIONS:elevated risk screening, Patient's personal history of adenomatous colon polyps, Patient's immediate family history of colon cancer, and Last colonoscopy performed 2003. MEDICATIONS: MAC sedation, administered by CRNA and propofol (Diprivan) 450mg  IV  DESCRIPTION OF PROCEDURE:   After the risks benefits and alternatives of the procedure were thoroughly explained, informed consent was obtained.  A digital rectal exam revealed no rectal mass.   The LB [id number]  endoscope was introduced through the anus and advanced to the cecum, which was identified by both the appendix and ileocecal valve. No adverse events experienced. The quality of the prep was Moviprep fair requiring copious irrigation and lavage.  The instrument was then slowly withdrawn as the colon was fully examined.    COLON FINDINGS: A sessile polyp measuring 5 mm in size was found at the cecum.  A polypectomy was performed with a cold snare.  The resection was complete and the polyp tissue was completely retrieved.   Two sessile polyps measuring 4 and 8 mm in size were found in the transverse colon.  Polypectomy was performed using cold snare.  All resections were complete and all polyp tissue was completely retrieved.   Medium sized probable lipoma (+ pillow sign) was found at the hepatic flexure.  Multiple biopsies of the lesion were performed using cold forceps.   A sessile polyp measuring 4 mm in size was found in the descending colon.  A polypectomy was performed with cold forceps.  The resection  was complete and the polyp tissue was completely retrieved.   Mild diverticulosis was noted at the cecum and in the ascending colon. Moderate sized internal hemorrhoids were found.  Retroflexed views revealed internal hemorrhoids. The time to cecum=5 minutes 01 seconds.  Withdrawal time=22 minutes 35 seconds.  The scope was withdrawn and the procedure completed. COMPLICATIONS: There were no complications.  ENDOSCOPIC IMPRESSION: 1.   Sessile polyp measuring 5 mm in size was found at the cecum; polypectomy was performed with a cold snare 2.   Two sessile polyps measuring 4 and 8 mm in size were found in the transverse colon; Polypectomy was performed using cold snare 3.   Medium sized lipoma at the hepatic flexure; multiple biopsies of the lesion were performed 4.   Sessile polyp measuring 4 mm in size was found in the descending colon; polypectomy was performed with cold forceps 5.   Mild diverticulosis was noted at the cecum and in the ascending colon 6.   Moderate sized internal hemorrhoids  RECOMMENDATIONS: 1.  Hold aspirin, aspirin products, and anti-inflammatory medication for 1 week. 2.  Await pathology results 3.  High fiber diet 4.  Timing of repeat colonoscopy will be determined by pathology findings. 5.  You will receive a letter within 1-2 weeks with the results of your biopsy as well as final recommendations.  Please call my office if you have not received a letter after 3 weeks.   eSigned:  Beverley Fiedler, MD 12/28/2012 10:16 AM   cc: Artist Pais Doe-Hyun, The Patient   PATIENT NAME:  Jacob Bishop, Jacob Bishop MR#: [id number]

## 2012-12-28 NOTE — Progress Notes (Signed)
Patient did not experience any of the following events: a burn prior to discharge; a fall within the facility; wrong site/side/patient/procedure/implant event; or a hospital transfer or hospital admission upon discharge from the facility. (G8907) Patient did not have preoperative order for IV antibiotic SSI prophylaxis. (G8918)  

## 2012-12-29 ENCOUNTER — Telehealth: Payer: Self-pay | Admitting: *Deleted

## 2012-12-29 NOTE — Telephone Encounter (Signed)
  Follow up Call-  Call back number 12/28/2012  Post procedure Call Back phone  # [phone]  Permission to leave phone message Yes     Patient questions:  Do you have a fever, pain , or abdominal swelling? no Pain Score  0 *  Have you tolerated food without any problems? yes  Have you been able to return to your normal activities? yes  Do you have any questions about your discharge instructions: Diet   no Medications  no Follow up visit  no  Do you have questions or concerns about your Care? no  Actions: * If pain score is 4 or above: No action needed, pain <4.

## 2012-12-30 ENCOUNTER — Encounter: Payer: Self-pay | Admitting: Internal Medicine

## 2012-12-30 ENCOUNTER — Ambulatory Visit (INDEPENDENT_AMBULATORY_CARE_PROVIDER_SITE_OTHER): Payer: Medicare Other | Admitting: Internal Medicine

## 2012-12-30 VITALS — BP 110/60 | Wt 303.0 lb

## 2012-12-30 DIAGNOSIS — M1711 Unilateral primary osteoarthritis, right knee: Secondary | ICD-10-CM

## 2012-12-30 DIAGNOSIS — R7309 Other abnormal glucose: Secondary | ICD-10-CM

## 2012-12-30 DIAGNOSIS — M1710 Unilateral primary osteoarthritis, unspecified knee: Secondary | ICD-10-CM

## 2012-12-30 DIAGNOSIS — E785 Hyperlipidemia, unspecified: Secondary | ICD-10-CM | POA: Diagnosis not present

## 2012-12-30 NOTE — Assessment & Plan Note (Signed)
72 year old white male presents for her third injection of Synvisc for osteoarthritis of right knee. Area prepped with Betadine. Sterile technique maintained. 2 mL of Synvisc injected. No complications.

## 2012-12-30 NOTE — Progress Notes (Signed)
Subjective:    Patient ID: Jacob Bishop, male    DOB: [birth year], 72 y.o.   MRN: [id number]  HPI  72 year old white male for followup regarding chronic right knee osteoarthritis. He is here for third injection of Synvisc. He continues to notice mild improvement in right knee pain.  Review of Systems Negative for joint redness or swelling    Past Medical History  Diagnosis Date  . Hypertension   . Diabetes mellitus     type II  . CAD in native artery   . Hyperlipidemia   . Edema   . Obesity   . OSA (obstructive sleep apnea)   . Cellulitis   . Dermatitis   . Rhinosinusitis   . Skin lesion   . Positional vertigo   . Hx of colonic polyps   . History of kidney stones     History   Social History  . Marital Status: Married    Spouse Name: N/A    Number of Children: N/A  . Years of Education: N/A   Occupational History  . OWNER, restaurant    Social History Main Topics  . Smoking status: Former Smoker    Quit date: 01/10/1996  . Smokeless tobacco: Never Used  . Alcohol Use: No  . Drug Use: No  . Sexually Active: Not on file   Other Topics Concern  . Not on file   Social History Narrative   Grew up in [place]. Mother was [place], Father [place].    Past Surgical History  Procedure Laterality Date  . Cardiac catheterization    . Coronary stent placement      3 stents /1997  . Colonoscopy    . Polypectomy      Family History  Problem Relation Age of Onset  . Cancer Mother   . Heart disease Father   . Heart disease Sister   . Diabetes Sister   . Colon cancer Sister     Allergies  Allergen Reactions  . Lipitor (Atorvastatin Calcium) Other (See Comments)    Muscle soreness    Current Outpatient Prescriptions on File Prior to Visit  Medication Sig Dispense Refill  . amLODipine (NORVASC) 5 MG tablet Take 1/2 tablet by mouth once daily  30 tablet  5  . aspirin 81 MG tablet Take 81 mg by mouth daily.        . fish oil-omega-3 fatty acids 1000  MG capsule Take 1 g by mouth daily.        . fluticasone (FLONASE) 50 MCG/ACT nasal spray Place 2 sprays into the nose daily as needed.  16 g  3  . Liraglutide (VICTOZA) 18 MG/3ML SOLN Inject 0.2 mLs (1.2 mg total) into the skin daily.  6 mL  5  . metFORMIN (GLUCOPHAGE-XR) 500 MG 24 hr tablet Take 1 tablet (500 mg total) by mouth daily with breakfast.  90 tablet  1  . mirabegron ER (MYRBETRIQ) 25 MG TB24 Take 50 mg by mouth daily.      Marland Kitchen nystatin (MYCOSTATIN) 100000 UNIT/ML suspension Take 5 mLs (500,000 Units total) by mouth 4 (four) times daily.  60 mL  1  . simvastatin (ZOCOR) 20 MG tablet TAKE ONE TABLET BY MOUTH   NIGHTLY AT BEDTIME  30 tablet  5  . Tamsulosin HCl (FLOMAX) 0.4 MG CAPS Take 0.4 mg by mouth daily.         No current facility-administered medications on file prior to visit.    BP 110/60  Wt 303  lb (137.44 kg)  BMI 42.28 kg/m2       Objective:   Physical Exam  Constitutional: He appears well-developed and well-nourished.  Cardiovascular: Normal rate, regular rhythm and normal heart sounds.   Pulmonary/Chest: Effort normal and breath sounds normal. He has no wheezes.  Musculoskeletal: Normal range of motion. He exhibits no edema and no tenderness.          Assessment & Plan:

## 2012-12-30 NOTE — Patient Instructions (Addendum)
Please complete the following lab tests before your next follow up appointment: BMET, A1c - 790.29 FLP, LFTs - 272.4 

## 2013-01-03 ENCOUNTER — Encounter: Payer: Self-pay | Admitting: Internal Medicine

## 2013-01-04 ENCOUNTER — Ambulatory Visit: Payer: Medicare Other | Admitting: Internal Medicine

## 2013-01-05 ENCOUNTER — Other Ambulatory Visit: Payer: Self-pay | Admitting: Internal Medicine

## 2013-01-11 ENCOUNTER — Telehealth: Payer: Self-pay | Admitting: Internal Medicine

## 2013-01-11 NOTE — Telephone Encounter (Signed)
Pt did not get the knee films ordered 11/30/12. Please advise - contact pt or cancel order as appropriate.

## 2013-01-11 NOTE — Telephone Encounter (Signed)
Call pt - I suggest he obtain x ray of right knee as directed

## 2013-01-13 NOTE — Telephone Encounter (Signed)
Pt will go next week.

## 2013-01-14 ENCOUNTER — Ambulatory Visit: Payer: Medicare Other | Admitting: Nurse Practitioner

## 2013-02-14 ENCOUNTER — Other Ambulatory Visit: Payer: Medicare Other

## 2013-02-18 ENCOUNTER — Telehealth: Payer: Self-pay | Admitting: Internal Medicine

## 2013-02-18 NOTE — Telephone Encounter (Addendum)
Pt would like to know if you have any samples of mirabegron ER (MYRBETRIQ) 25 MG TB24?  Pt's insurance has stopped paying for this and want him to try something else. Pt advised.  Pt states he never got the xray on his knee. Would like to go ahead and schedule that again. Pls advise.

## 2013-02-18 NOTE — Telephone Encounter (Signed)
Cindy, please order x ray of right knee.   Ok to provide samples of Myrbetriq

## 2013-02-18 NOTE — Telephone Encounter (Signed)
Samples up front for p/u, order is already in there for the knee xray, pt was instructed to go to [facility]

## 2013-02-21 ENCOUNTER — Ambulatory Visit: Payer: Medicare Other | Admitting: Internal Medicine

## 2013-02-22 ENCOUNTER — Ambulatory Visit: Payer: Medicare Other | Admitting: Nurse Practitioner

## 2013-02-22 ENCOUNTER — Ambulatory Visit (HOSPITAL_COMMUNITY)
Admission: RE | Admit: 2013-02-22 | Discharge: 2013-02-22 | Disposition: A | Discharge status: Home / Self Care | Payer: Medicare Other | Source: Ambulatory Visit | Attending: Internal Medicine | Admitting: Internal Medicine

## 2013-02-22 DIAGNOSIS — M1711 Unilateral primary osteoarthritis, right knee: Secondary | ICD-10-CM

## 2013-02-22 DIAGNOSIS — M238X9 Other internal derangements of unspecified knee: Secondary | ICD-10-CM | POA: Diagnosis not present

## 2013-02-22 DIAGNOSIS — M25569 Pain in unspecified knee: Secondary | ICD-10-CM | POA: Diagnosis not present

## 2013-02-22 DIAGNOSIS — G8929 Other chronic pain: Secondary | ICD-10-CM | POA: Insufficient documentation

## 2013-02-22 DIAGNOSIS — M898X9 Other specified disorders of bone, unspecified site: Secondary | ICD-10-CM | POA: Diagnosis not present

## 2013-03-03 ENCOUNTER — Other Ambulatory Visit: Payer: Self-pay

## 2013-03-14 ENCOUNTER — Telehealth: Payer: Self-pay | Admitting: Internal Medicine

## 2013-03-14 NOTE — Telephone Encounter (Signed)
error 

## 2013-03-27 ENCOUNTER — Other Ambulatory Visit: Payer: Self-pay | Admitting: Internal Medicine

## 2013-03-31 ENCOUNTER — Other Ambulatory Visit: Payer: Self-pay | Admitting: Internal Medicine

## 2013-03-31 ENCOUNTER — Other Ambulatory Visit (INDEPENDENT_AMBULATORY_CARE_PROVIDER_SITE_OTHER): Payer: Medicare Other

## 2013-03-31 DIAGNOSIS — E785 Hyperlipidemia, unspecified: Secondary | ICD-10-CM | POA: Diagnosis not present

## 2013-03-31 DIAGNOSIS — R7309 Other abnormal glucose: Secondary | ICD-10-CM | POA: Diagnosis not present

## 2013-03-31 LAB — BASIC METABOLIC PANEL
CO2: 30 mEq/L (ref 19–32)
Calcium: 9.4 mg/dL (ref 8.4–10.5)
Chloride: 106 mEq/L (ref 96–112)
Glucose, Bld: 165 mg/dL — ABNORMAL HIGH (ref 70–99)
Potassium: 4.4 mEq/L (ref 3.5–5.1)
Sodium: 142 mEq/L (ref 135–145)

## 2013-03-31 LAB — LIPID PANEL
Cholesterol: 148 mg/dL (ref 0–200)
HDL: 34.7 mg/dL — ABNORMAL LOW (ref >39.00)
LDL Cholesterol: 80 mg/dL (ref 0–99)
Total CHOL/HDL Ratio: 4
Triglycerides: 169 mg/dL — ABNORMAL HIGH (ref 0.0–149.0)
VLDL: 33.8 mg/dL (ref 0.0–40.0)

## 2013-03-31 LAB — CBC WITH DIFFERENTIAL/PLATELET
Basophils Relative: 0.4 % (ref 0.0–3.0)
Eosinophils Relative: 2.8 % (ref 0.0–5.0)
Lymphocytes Relative: 41 % (ref 12.0–46.0)
MCV: 94.3 fl (ref 78.0–100.0)
Neutrophils Relative %: 44.3 % (ref 43.0–77.0)
RBC: 4.42 Mil/uL (ref 4.22–5.81)
WBC: 5.4 10*3/uL (ref 4.5–10.5)

## 2013-03-31 LAB — HEPATIC FUNCTION PANEL
ALT: 64 U/L — ABNORMAL HIGH (ref 0–53)
Total Bilirubin: 1 mg/dL (ref 0.3–1.2)

## 2013-04-05 ENCOUNTER — Ambulatory Visit (INDEPENDENT_AMBULATORY_CARE_PROVIDER_SITE_OTHER): Payer: Medicare Other | Admitting: Internal Medicine

## 2013-04-05 ENCOUNTER — Encounter: Payer: Self-pay | Admitting: Internal Medicine

## 2013-04-05 VITALS — BP 100/60 | HR 76 | Temp 98.3°F | Wt 300.0 lb

## 2013-04-05 DIAGNOSIS — M1710 Unilateral primary osteoarthritis, unspecified knee: Secondary | ICD-10-CM

## 2013-04-05 DIAGNOSIS — E119 Type 2 diabetes mellitus without complications: Secondary | ICD-10-CM | POA: Diagnosis not present

## 2013-04-05 DIAGNOSIS — N318 Other neuromuscular dysfunction of bladder: Secondary | ICD-10-CM

## 2013-04-05 DIAGNOSIS — D696 Thrombocytopenia, unspecified: Secondary | ICD-10-CM | POA: Diagnosis not present

## 2013-04-05 DIAGNOSIS — M1711 Unilateral primary osteoarthritis, right knee: Secondary | ICD-10-CM

## 2013-04-05 DIAGNOSIS — N3281 Overactive bladder: Secondary | ICD-10-CM

## 2013-04-05 NOTE — Patient Instructions (Addendum)
Limit your carbohydrate intake to 30 grams per meal Start water exercise program 3-4 times per week

## 2013-04-05 NOTE — Assessment & Plan Note (Signed)
Stable.  No change.  Possible ITP.  Continue to monitor CBCD

## 2013-04-05 NOTE — Progress Notes (Signed)
Subjective:    Patient ID: Jacob Bishop, male    DOB: 26-Mar-1941, 72 y.o.   MRN: [id number]  HPI  72 year old white male with history of hypertension type 2 diabetes and chronic osteoarthritis of the knees for followup. Patient had x-ray of right knee. It showed degenerative joint disease with joint space narrowing but it was not severe. Patient reports falling on left knee one to 2 months ago. He had initial swelling but now symptoms have resolved.  Type II diabetes-he struggles with following low-carb diet. His A1c is stable. He is unable to tolerate metformin due to gastrointestinal side effects. He is taking Victoza 1.2 mg once daily.  Patient followed by urologist for overactive bladder. Unfortunately overactive bladder medications are cost prohibitive.  Review of Systems Negative for chest pain or shortness of breath  Past Medical History  Diagnosis Date  . Hypertension   . Diabetes mellitus     type II  . CAD in native artery   . Hyperlipidemia   . Edema   . Obesity   . OSA (obstructive sleep apnea)   . Cellulitis   . Dermatitis   . Rhinosinusitis   . Skin lesion   . Positional vertigo   . Hx of colonic polyps   . History of kidney stones     History   Social History  . Marital Status: Married    Spouse Name: N/A    Number of Children: N/A  . Years of Education: N/A   Occupational History  . OWNER, restaurant    Social History Main Topics  . Smoking status: Former Smoker    Quit date: 01/10/1996  . Smokeless tobacco: Never Used  . Alcohol Use: No  . Drug Use: No  . Sexually Active: Not on file   Other Topics Concern  . Not on file   Social History Narrative   Grew up in [place]. Mother was [place], Father [place].    Past Surgical History  Procedure Laterality Date  . Cardiac catheterization    . Coronary stent placement      3 stents /1997  . Colonoscopy    . Polypectomy      Family History  Problem Relation Age of Onset  . Cancer  Mother   . Heart disease Father   . Heart disease Sister   . Diabetes Sister   . Colon cancer Sister     Allergies  Allergen Reactions  . Lipitor (Atorvastatin Calcium) Other (See Comments)    Muscle soreness    Current Outpatient Prescriptions on File Prior to Visit  Medication Sig Dispense Refill  . amLODipine (NORVASC) 5 MG tablet Take 1/2 tablet by mouth once daily  30 tablet  5  . aspirin 81 MG tablet Take 81 mg by mouth daily.        . fluticasone (FLONASE) 50 MCG/ACT nasal spray Place 2 sprays into the nose daily as needed.  16 g  3  . mirabegron ER (MYRBETRIQ) 25 MG TB24 Take 50 mg by mouth daily.      . simvastatin (ZOCOR) 20 MG tablet TAKE ONE TABLET BY MOUTH   NIGHTLY AT BEDTIME  30 tablet  5  . Tamsulosin HCl (FLOMAX) 0.4 MG CAPS Take 0.4 mg by mouth daily.        Marland Kitchen VICTOZA 18 MG/3ML SOPN Inject 0.2 mls (1.2 mg total) into the skin daily.  6 mL  4   No current facility-administered medications on file prior to visit.  BP 100/60  Pulse 76  Temp(Src) 98.3 F (36.8 C) (Oral)  Wt 300 lb (136.079 kg)  BMI 41.86 kg/m2       Objective:   Physical Exam  Constitutional: He is oriented to person, place, and time. He appears well-developed and well-nourished.  HENT:  Head: Normocephalic and atraumatic.  Neck: Neck supple.  No carotid bruit  Cardiovascular: Normal rate, regular rhythm and normal heart sounds.   Pulmonary/Chest: Effort normal and breath sounds normal. He has no wheezes.  Musculoskeletal:  Trace bilateral edema No left knee tenderness or swelling  Neurological: He is alert and oriented to person, place, and time.  Skin: Skin is warm and dry.  Psychiatric: He has a normal mood and affect. His behavior is normal.          Assessment & Plan:

## 2013-04-05 NOTE — Assessment & Plan Note (Signed)
His symptoms are stable. Knee x-ray showed mild joint space narrowing. I encouraged nonweightbearing exercises (water aerobics)

## 2013-04-05 NOTE — Assessment & Plan Note (Signed)
Samples of Myrbetriq provided.

## 2013-04-05 NOTE — Assessment & Plan Note (Signed)
Stable. Continue Victoza.  Patient unable to tolerate metformin due to gastrointestinal side effects.  Lab Results  Component Value Date   HGBA1C 7.5* 03/31/2013

## 2013-04-09 ENCOUNTER — Other Ambulatory Visit: Payer: Self-pay | Admitting: Internal Medicine

## 2013-05-24 DIAGNOSIS — N401 Enlarged prostate with lower urinary tract symptoms: Secondary | ICD-10-CM | POA: Diagnosis not present

## 2013-05-24 DIAGNOSIS — R3129 Other microscopic hematuria: Secondary | ICD-10-CM | POA: Diagnosis not present

## 2013-05-24 DIAGNOSIS — R1032 Left lower quadrant pain: Secondary | ICD-10-CM | POA: Diagnosis not present

## 2013-05-24 DIAGNOSIS — N200 Calculus of kidney: Secondary | ICD-10-CM | POA: Diagnosis not present

## 2013-06-12 ENCOUNTER — Other Ambulatory Visit: Payer: Self-pay | Admitting: Internal Medicine

## 2013-07-07 ENCOUNTER — Ambulatory Visit: Payer: Medicare Other | Admitting: Internal Medicine

## 2013-07-13 ENCOUNTER — Ambulatory Visit (INDEPENDENT_AMBULATORY_CARE_PROVIDER_SITE_OTHER): Payer: Medicare Other | Admitting: Internal Medicine

## 2013-07-13 ENCOUNTER — Encounter: Payer: Self-pay | Admitting: Internal Medicine

## 2013-07-13 VITALS — BP 130/70 | HR 88 | Temp 97.9°F | Ht 71.0 in | Wt 298.0 lb

## 2013-07-13 DIAGNOSIS — E119 Type 2 diabetes mellitus without complications: Secondary | ICD-10-CM | POA: Diagnosis not present

## 2013-07-13 DIAGNOSIS — I10 Essential (primary) hypertension: Secondary | ICD-10-CM | POA: Diagnosis not present

## 2013-07-13 DIAGNOSIS — Z23 Encounter for immunization: Secondary | ICD-10-CM | POA: Diagnosis not present

## 2013-07-13 MED ORDER — AMLODIPINE BESYLATE 5 MG PO TABS: ORAL_TABLET | ORAL | Status: DC

## 2013-07-13 MED ORDER — LIRAGLUTIDE 18 MG/3ML ~~LOC~~ SOPN: PEN_INJECTOR | SUBCUTANEOUS | Status: DC

## 2013-07-13 NOTE — Assessment & Plan Note (Signed)
Well controlled.  No change in medication.

## 2013-07-13 NOTE — Patient Instructions (Signed)
Try to lose 15 lbs before your next office visit.

## 2013-07-13 NOTE — Progress Notes (Signed)
Subjective:    Patient ID: Jacob Bishop, male    DOB: [birth year], 72 y.o.   MRN: [id number]  HPI  72 year old white male with uncontrolled DM II, hypertension and morbid obesity for follow up.  Patient reports he is doing fairly well.  He is still experiencing intermittent knee pains.    DM II - having difficulty sticking to diabetic diet  Htn - stable.   Review of Systems Negative for chest pain or SOB.    Past Medical History  Diagnosis Date  . Hypertension   . Diabetes mellitus     type II  . CAD in native artery   . Hyperlipidemia   . Edema   . Obesity   . OSA (obstructive sleep apnea)   . Cellulitis   . Dermatitis   . Rhinosinusitis   . Skin lesion   . Positional vertigo   . Hx of colonic polyps   . History of kidney stones     History   Social History  . Marital Status: Married    Spouse Name: N/A    Number of Children: N/A  . Years of Education: N/A   Occupational History  . OWNER, restaurant    Social History Main Topics  . Smoking status: Former Smoker    Quit date: 01/10/1996  . Smokeless tobacco: Never Used  . Alcohol Use: No  . Drug Use: No  . Sexual Activity: Not on file   Other Topics Concern  . Not on file   Social History Narrative   Grew up in [place]. Mother was [place], Father [place].    Past Surgical History  Procedure Laterality Date  . Cardiac catheterization    . Coronary stent placement      3 stents /1997  . Colonoscopy    . Polypectomy      Family History  Problem Relation Age of Onset  . Cancer Mother   . Heart disease Father   . Heart disease Sister   . Diabetes Sister   . Colon cancer Sister     Allergies  Allergen Reactions  . Lipitor [Atorvastatin Calcium] Other (See Comments)    Muscle soreness    Current Outpatient Prescriptions on File Prior to Visit  Medication Sig Dispense Refill  . aspirin 81 MG tablet Take 81 mg by mouth daily.        . fluticasone (FLONASE) 50 MCG/ACT nasal spray  Place 2 sprays into the nose daily as needed.  16 g  3  . mirabegron ER (MYRBETRIQ) 25 MG TB24 Take 50 mg by mouth daily.      . simvastatin (ZOCOR) 20 MG tablet TAKE ONE TABLET BY MOUTH NIGHTLY AT BEDTIME   30 tablet  4  . Tamsulosin HCl (FLOMAX) 0.4 MG CAPS Take 0.4 mg by mouth daily.         No current facility-administered medications on file prior to visit.    BP 130/70  Pulse 88  Temp(Src) 97.9 F (36.6 C) (Oral)  Ht 5\' 11"  (1.803 m)  Wt 298 lb (135.172 kg)  BMI 41.58 kg/m2    Objective:   Physical Exam  Constitutional: He is oriented to person, place, and time. He appears well-developed and well-nourished.  Neck: Neck supple.  No adenopathy  Cardiovascular: Normal rate, regular rhythm and normal heart sounds.   No murmur heard. Pulmonary/Chest: Effort normal and breath sounds normal. He has no wheezes.  Neurological: He is alert and oriented to person, place, and time.  No cranial nerve deficit.  Psychiatric: He has a normal mood and affect. His behavior is normal.          Assessment & Plan:

## 2013-07-13 NOTE — Assessment & Plan Note (Signed)
Stable.  No change in medication.  I encouraged weight loss.

## 2013-07-13 NOTE — Assessment & Plan Note (Signed)
Goal weight loss 15 lbs over next 3 months.  Dietary changes difficult to maintain.

## 2013-07-14 LAB — BASIC METABOLIC PANEL
BUN: 19 mg/dL (ref 6–23)
CO2: 29 mEq/L (ref 19–32)
Chloride: 104 mEq/L (ref 96–112)
Glucose, Bld: 214 mg/dL — ABNORMAL HIGH (ref 70–99)
Potassium: 4.2 mEq/L (ref 3.5–5.1)

## 2013-07-14 LAB — MICROALBUMIN / CREATININE URINE RATIO: Microalb Creat Ratio: 1.5 mg/g (ref 0.0–30.0)

## 2013-08-29 ENCOUNTER — Telehealth: Payer: Self-pay | Admitting: Internal Medicine

## 2013-08-29 ENCOUNTER — Encounter: Payer: Self-pay | Admitting: *Deleted

## 2013-08-29 NOTE — Telephone Encounter (Signed)
Ok to provide letter excusing patient from [employer] [facility] duty due to severe osteoarthritis of knees

## 2013-08-29 NOTE — Telephone Encounter (Signed)
Pt has jury summons for 09/13/13. Pt would like to know if Dr Shawna Orleans would write him a letter to get out of this. Pt has knee issues, and there's  Lots of walking involved. Also has uti issues,and several health problems. pls advise.

## 2013-08-30 ENCOUNTER — Encounter: Payer: Self-pay | Admitting: *Deleted

## 2013-08-30 NOTE — Telephone Encounter (Signed)
Letter up front for pick up, pt aware

## 2013-10-06 ENCOUNTER — Telehealth: Payer: Self-pay | Admitting: Internal Medicine

## 2013-10-06 NOTE — Telephone Encounter (Signed)
Pt would like to know if he needs to do labs prior to his appt on 2/18? Pt last seen 11/19

## 2013-10-06 NOTE — Telephone Encounter (Signed)
Yes.  BMET and A1c - 401.9, 250.00

## 2013-10-07 NOTE — Telephone Encounter (Signed)
done

## 2013-10-10 ENCOUNTER — Other Ambulatory Visit: Payer: Medicare Other

## 2013-10-12 ENCOUNTER — Ambulatory Visit: Payer: Medicare Other | Admitting: Internal Medicine

## 2013-10-25 ENCOUNTER — Other Ambulatory Visit (INDEPENDENT_AMBULATORY_CARE_PROVIDER_SITE_OTHER): Payer: Medicare Other

## 2013-10-25 DIAGNOSIS — I10 Essential (primary) hypertension: Secondary | ICD-10-CM | POA: Diagnosis not present

## 2013-10-25 DIAGNOSIS — E119 Type 2 diabetes mellitus without complications: Secondary | ICD-10-CM | POA: Diagnosis not present

## 2013-10-25 LAB — BASIC METABOLIC PANEL
BUN: 20 mg/dL (ref 6–23)
CALCIUM: 8.9 mg/dL (ref 8.4–10.5)
CO2: 26 mEq/L (ref 19–32)
CREATININE: 1.1 mg/dL (ref 0.4–1.5)
Chloride: 104 mEq/L (ref 96–112)
GFR: 71.04 mL/min (ref >60.00)
GLUCOSE: 235 mg/dL — AB (ref 70–99)
POTASSIUM: 4 meq/L (ref 3.5–5.1)
Sodium: 137 mEq/L (ref 135–145)

## 2013-10-25 LAB — HEMOGLOBIN A1C: HEMOGLOBIN A1C: 8.8 % — AB (ref 4.6–6.5)

## 2013-11-09 ENCOUNTER — Ambulatory Visit: Payer: Medicare Other | Admitting: Internal Medicine

## 2013-11-23 ENCOUNTER — Other Ambulatory Visit: Payer: Self-pay | Admitting: Internal Medicine

## 2013-11-25 DIAGNOSIS — R918 Other nonspecific abnormal finding of lung field: Secondary | ICD-10-CM | POA: Diagnosis not present

## 2013-11-25 DIAGNOSIS — J189 Pneumonia, unspecified organism: Secondary | ICD-10-CM | POA: Diagnosis not present

## 2013-11-25 DIAGNOSIS — R079 Chest pain, unspecified: Secondary | ICD-10-CM | POA: Diagnosis not present

## 2013-11-25 DIAGNOSIS — R109 Unspecified abdominal pain: Secondary | ICD-10-CM | POA: Diagnosis not present

## 2013-11-25 DIAGNOSIS — R350 Frequency of micturition: Secondary | ICD-10-CM | POA: Diagnosis not present

## 2013-11-28 ENCOUNTER — Telehealth: Payer: Self-pay | Admitting: Internal Medicine

## 2013-11-28 NOTE — Telephone Encounter (Signed)
Spoke with patient and patient refuses to see another provider, and was advised that i could get him in today to see another provider. Pt stated that he will wait till his appointment with Dr. Shawna Orleans in 2 week. Advised patient that if he continues to have SOB to call the office or go to urgent care. Pt verbally understands.

## 2013-11-28 NOTE — Telephone Encounter (Signed)
Patient Information:  Caller Name: Draylen  Phone: [phone]  Patient: Jacob Bishop, Jacob Bishop  Gender: Male  DOB: August 16, 1941  Age: 73 Years  PCP: Shawna Orleans [facility] [place]) (Adults only)  Office Follow Up:  Does the office need to follow up with this patient?: Yes  Instructions For The Office: Patient diagnosed with Pneumonia on Friday 11/25/13. He is currently on antibiotic. Describes slight shortness of breath and pain in right rib cage. He also has had a 14 hour care ride within the last two weeks.  He would like to follow up with Dr. Shawna Orleans.  Dr. Shawna Orleans not in office. Declined appt with another provider.  PLEASE CONTACT PATIENT.  RN Note:  Patient diagnosed with Pneumonia on Friday 11/25/13. He is currently on antibiotic. Describes slight shortness of breath and pain in right rib cage. He also has had a 14 hour care ride within the last two weeks.  He would like to follow up with Dr. Shawna Orleans.  Dr. Shawna Orleans not in office. Declined appt with another provider.  PLEASE CONTACT PATIENT.  Symptoms  Reason For Call & Symptoms: Patient states that he was having pain with inspiration on Friday 11/25/13. He was seen at an [facility] .  He had X-Ray that revealed Pneumonia in right Lung.  He is currently antibiotics (?name) . He is requesting a follow up with Dr. Shawna Orleans.  He describes some shortness of breath at times but is better . He complains  of right sided rib cage pain. Patient does describe long distance travel in last two weeks at 14 hour drive.  (2) He woulld like to discuss the flu shot he had he had in office a few months ago and he became sick from it  .  Reviewed Health History In EMR: Yes  Reviewed Medications In EMR: Yes  Reviewed Allergies In EMR: Yes  Reviewed Surgeries / Procedures: Yes  Date of Onset of Symptoms: 11/25/2013  Treatments Tried: Antibiotic, Advil  Treatments Tried Worked: No  Guideline(s) Used:  Breathing Difficulty  Disposition Per Guideline:   Go to ED Now  Reason For Disposition Reached:   Recent  long-distance travel with prolonged time in car, bus, plane, or train (i.e., within past 2 weeks; 6 or more hours duration)  Advice Given:  Call Back If:  Severe difficulty breathing occurs  Fever more than 100.5 F (38.1 C)  You become worse.  General Care Advice for Breathing Difficulty:  Find position of greatest comfort. For most patients the best position is semi-upright (e.g., sitting up in a comfortable chair or lying back against pillows).  Elevate head of bed (e.g., use pillows or place blocks under bed).  Avoid smoke or fume exposure.  Use a humidifier.  RN Overrode Recommendation:  Make Appointment  Patient diagnosed with Pneumonia on Friday 11/25/13. He is currently on antibiotic. Describes slight shortness of breath and pain in right rib cage. He also has had a 14 hour care ride within the last two weeks.  He would like to follow up with Dr. Shawna Orleans.  Dr. Shawna Orleans not in office. Declined appt with another provider.  PLEASE CONTACT PATIENT.

## 2013-12-01 DIAGNOSIS — N401 Enlarged prostate with lower urinary tract symptoms: Secondary | ICD-10-CM | POA: Diagnosis not present

## 2013-12-01 DIAGNOSIS — N200 Calculus of kidney: Secondary | ICD-10-CM | POA: Diagnosis not present

## 2013-12-01 DIAGNOSIS — N3020 Other chronic cystitis without hematuria: Secondary | ICD-10-CM | POA: Diagnosis not present

## 2013-12-01 DIAGNOSIS — N139 Obstructive and reflux uropathy, unspecified: Secondary | ICD-10-CM | POA: Diagnosis not present

## 2013-12-01 DIAGNOSIS — N138 Other obstructive and reflux uropathy: Secondary | ICD-10-CM | POA: Diagnosis not present

## 2013-12-16 ENCOUNTER — Ambulatory Visit (INDEPENDENT_AMBULATORY_CARE_PROVIDER_SITE_OTHER): Payer: Medicare Other | Admitting: Internal Medicine

## 2013-12-16 ENCOUNTER — Encounter: Payer: Self-pay | Admitting: Internal Medicine

## 2013-12-16 VITALS — BP 122/62 | HR 72 | Temp 98.1°F | Ht 71.0 in | Wt 298.0 lb

## 2013-12-16 DIAGNOSIS — I10 Essential (primary) hypertension: Secondary | ICD-10-CM

## 2013-12-16 DIAGNOSIS — J189 Pneumonia, unspecified organism: Secondary | ICD-10-CM | POA: Diagnosis not present

## 2013-12-16 DIAGNOSIS — E119 Type 2 diabetes mellitus without complications: Secondary | ICD-10-CM

## 2013-12-16 DIAGNOSIS — I2510 Atherosclerotic heart disease of native coronary artery without angina pectoris: Secondary | ICD-10-CM

## 2013-12-16 DIAGNOSIS — R0602 Shortness of breath: Secondary | ICD-10-CM | POA: Diagnosis not present

## 2013-12-16 MED ORDER — INSULIN GLARGINE 100 UNIT/ML SOLOSTAR PEN: PEN_INJECTOR | SUBCUTANEOUS | Status: DC

## 2013-12-16 MED ORDER — INSULIN PEN NEEDLE 31G X 8 MM MISC: Status: DC

## 2013-12-16 NOTE — Assessment & Plan Note (Signed)
Stable. He is asymptomatic.  EKG shows normal sinus rhythm at 74 beats for minute.

## 2013-12-16 NOTE — Patient Instructions (Signed)
Please complete chest ray PA / LAT before your next office visit - follow up CXR for right lower lobe pneumonia (early April, 2015)

## 2013-12-16 NOTE — Assessment & Plan Note (Signed)
BP is stable. No change in medication. BP: 122/62 mmHg

## 2013-12-16 NOTE — Progress Notes (Signed)
Subjective:    Patient ID: Jacob Bishop, male    DOB: 02/07/1941, 73 y.o.   MRN: [id number]  HPI  73 year old white male with morbid obesity, coronary artery disease and type 2 diabetes for routine followup. Interval medical history-patient reports he was seen at urgent care beginning of April 2015 for acute right lower rib pain. His symptoms occurred soon after returning from a long trip to [place]. Her sister passed away from complications of hip fracture.  Patient reports he was diagnosed with possible pneumonia and treated with antibiotics. His symptoms improved within 3 days. Patient did not have associated cough or fever.  He had mild shortness of breath. His respiratory symptoms have completely resolved. He is not having any issues with right lower rib pain.  Type II diabetes-his diabetes control is worsening. His last A1c elevated at 8.8  Patient reports using his Victoza regularly  Review of Systems Negative for chest pain,  Weight is stable    Past Medical History  Diagnosis Date  . Hypertension   . Diabetes mellitus     type II  . CAD in native artery   . Hyperlipidemia   . Edema   . Obesity   . OSA (obstructive sleep apnea)   . Cellulitis   . Dermatitis   . Rhinosinusitis   . Skin lesion   . Positional vertigo   . Hx of colonic polyps   . History of kidney stones     History   Social History  . Marital Status: Married    Spouse Name: N/A    Number of Children: N/A  . Years of Education: N/A   Occupational History  . OWNER, restaurant    Social History Main Topics  . Smoking status: Former Smoker    Quit date: 01/10/1996  . Smokeless tobacco: Never Used  . Alcohol Use: No  . Drug Use: No  . Sexual Activity: Not on file   Other Topics Concern  . Not on file   Social History Narrative   Grew up in [place]. Mother was [place], Father [place].    Past Surgical History  Procedure Laterality Date  . Cardiac catheterization    . Coronary  stent placement      3 stents /1997  . Colonoscopy    . Polypectomy      Family History  Problem Relation Age of Onset  . Cancer Mother   . Heart disease Father   . Heart disease Sister   . Diabetes Sister   . Colon cancer Sister     Allergies  Allergen Reactions  . Lipitor [Atorvastatin Calcium] Other (See Comments)    Muscle soreness    Current Outpatient Prescriptions on File Prior to Visit  Medication Sig Dispense Refill  . amLODipine (NORVASC) 5 MG tablet Take one-half tablet by mouth daily  30 tablet  4  . aspirin 81 MG tablet Take 81 mg by mouth daily.        . fluticasone (FLONASE) 50 MCG/ACT nasal spray Place 2 sprays into the nose daily as needed.  16 g  3  . Liraglutide (VICTOZA) 18 MG/3ML SOPN Inject 0.2 mls (1.2 mg total) into the skin daily.  6 mL  4  . mirabegron ER (MYRBETRIQ) 25 MG TB24 Take 50 mg by mouth daily.      . simvastatin (ZOCOR) 20 MG tablet TAKE ONE TABLET BY MOUTH NIGHTLY AT BEDTIME   30 tablet  2  . Tamsulosin HCl (FLOMAX) 0.4 MG  CAPS Take 0.4 mg by mouth daily.         No current facility-administered medications on file prior to visit.    BP 122/62  Pulse 72  Temp(Src) 98.1 F (36.7 C) (Oral)  Ht 5\' 11"  (1.803 m)  Wt 298 lb (135.172 kg)  BMI 41.58 kg/m2    Objective:   Physical Exam  Constitutional: He is oriented to person, place, and time. He appears well-developed and well-nourished. No distress.  HENT:  Head: Normocephalic and atraumatic.  Neck: Neck supple.  Cardiovascular: Normal rate, regular rhythm and normal heart sounds.   No murmur heard. Pulmonary/Chest: Effort normal and breath sounds normal. He has no wheezes.  No chest wall tenderness  Abdominal: Soft. Bowel sounds are normal. There is no tenderness.  Musculoskeletal:  Trace lower extremity edema bilaterally  Lymphadenopathy:    He has no cervical adenopathy.  Neurological: He is alert and oriented to person, place, and time. No cranial nerve deficit.  Skin:  Skin is warm and dry.  Psychiatric: He has a normal mood and affect. His behavior is normal.          Assessment & Plan:

## 2013-12-16 NOTE — Progress Notes (Signed)
Pre visit review using our clinic review tool, if applicable. No additional management support is needed unless otherwise documented below in the visit note. 

## 2013-12-16 NOTE — Assessment & Plan Note (Signed)
Patient's A1c is worsening despite using Victoza and low carb diet.  Start Lantus to 10 units. Patient advised to titrate upward 2 units every other day until morning blood sugars less than 150.  Lab Results  Component Value Date   HGBA1C 8.8* 10/25/2013

## 2013-12-16 NOTE — Assessment & Plan Note (Signed)
Patient experienced right lower/lateral rib pain in early April 2015. He was diagnosed with pneumonia at urgent care Center. Arrange followup chest x-ray before next office visit.

## 2013-12-19 ENCOUNTER — Ambulatory Visit (INDEPENDENT_AMBULATORY_CARE_PROVIDER_SITE_OTHER)
Admission: RE | Admit: 2013-12-19 | Discharge: 2013-12-19 | Disposition: A | Discharge status: Home / Self Care | Payer: Medicare Other | Source: Ambulatory Visit | Attending: Internal Medicine | Admitting: Internal Medicine

## 2013-12-19 DIAGNOSIS — R918 Other nonspecific abnormal finding of lung field: Secondary | ICD-10-CM | POA: Diagnosis not present

## 2013-12-19 DIAGNOSIS — R0602 Shortness of breath: Secondary | ICD-10-CM | POA: Diagnosis not present

## 2013-12-21 ENCOUNTER — Telehealth: Payer: Self-pay | Admitting: *Deleted

## 2013-12-21 NOTE — Telephone Encounter (Signed)
Ok to increase to Lantus 25 units at 10 pm.  Call with blood sugar readings in 2-3 days.

## 2013-12-21 NOTE — Telephone Encounter (Signed)
pts blood sugar fbs 225 yesterday.  Lantus up to 20 units and fbs 180 this morning.  Pt is concerned cause its not coming down fast and wants Dr Lora Havens advice

## 2013-12-21 NOTE — Telephone Encounter (Signed)
Pt verbalized understanding and had no questions 

## 2013-12-24 ENCOUNTER — Other Ambulatory Visit: Payer: Self-pay | Admitting: Internal Medicine

## 2014-01-23 ENCOUNTER — Ambulatory Visit (INDEPENDENT_AMBULATORY_CARE_PROVIDER_SITE_OTHER): Payer: Medicare Other | Admitting: Internal Medicine

## 2014-01-23 ENCOUNTER — Telehealth: Payer: Self-pay | Admitting: Internal Medicine

## 2014-01-23 ENCOUNTER — Encounter: Payer: Self-pay | Admitting: Internal Medicine

## 2014-01-23 VITALS — BP 126/60 | Temp 98.8°F | Wt 295.0 lb

## 2014-01-23 DIAGNOSIS — I10 Essential (primary) hypertension: Secondary | ICD-10-CM

## 2014-01-23 DIAGNOSIS — IMO0001 Reserved for inherently not codable concepts without codable children: Secondary | ICD-10-CM

## 2014-01-23 DIAGNOSIS — L259 Unspecified contact dermatitis, unspecified cause: Secondary | ICD-10-CM

## 2014-01-23 DIAGNOSIS — L309 Dermatitis, unspecified: Secondary | ICD-10-CM | POA: Insufficient documentation

## 2014-01-23 DIAGNOSIS — E1165 Type 2 diabetes mellitus with hyperglycemia: Secondary | ICD-10-CM

## 2014-01-23 DIAGNOSIS — I2510 Atherosclerotic heart disease of native coronary artery without angina pectoris: Secondary | ICD-10-CM

## 2014-01-23 DIAGNOSIS — E119 Type 2 diabetes mellitus without complications: Secondary | ICD-10-CM | POA: Diagnosis not present

## 2014-01-23 DIAGNOSIS — E785 Hyperlipidemia, unspecified: Secondary | ICD-10-CM

## 2014-01-23 MED ORDER — INSULIN GLARGINE 100 UNIT/ML SOLOSTAR PEN: PEN_INJECTOR | SUBCUTANEOUS | Status: DC

## 2014-01-23 MED ORDER — AMLODIPINE BESYLATE 2.5 MG PO TABS: 2.5000 mg | ORAL_TABLET | Freq: Every day | ORAL | Status: DC

## 2014-01-23 NOTE — Progress Notes (Signed)
Pre visit review using our clinic review tool, if applicable. No additional management support is needed unless otherwise documented below in the visit note. 

## 2014-01-23 NOTE — Telephone Encounter (Signed)
Pt was seen today, follow up appt was scheduled for 04/25/14, however dr. Shawna Orleans is requesting labs before the appt, pt states he is only 5 mins from our [place] office and is requesting the order for labs to be done at [place] prior to his follow-up.

## 2014-01-23 NOTE — Progress Notes (Signed)
Subjective:    Patient ID: Jacob Bishop, male    DOB: Jun 25, 1941, 73 y.o.   MRN: [id number]  HPI  73 year old white male with history of morbid obesity, type 2 diabetes and hypertension for routine followup. At previous visit we started Lantus. Patient self titrated to 30 units at bedtime. His morning blood sugars approximately 130. He denies any hypoglycemic episodes. He tries his best to follow a low carbohydrate diet.  Hypertension - stable.  He complains of intermittent pruritus of right ear canal. Review of Systems Negative for chest pain.    Past Medical History  Diagnosis Date  . Hypertension   . Diabetes mellitus     type II  . CAD in native artery   . Hyperlipidemia   . Edema   . Obesity   . OSA (obstructive sleep apnea)   . Cellulitis   . Dermatitis   . Rhinosinusitis   . Skin lesion   . Positional vertigo   . Hx of colonic polyps   . History of kidney stones     History   Social History  . Marital Status: Married    Spouse Name: N/A    Number of Children: N/A  . Years of Education: N/A   Occupational History  . OWNER, restaurant    Social History Main Topics  . Smoking status: Former Smoker    Quit date: 01/10/1996  . Smokeless tobacco: Never Used  . Alcohol Use: No  . Drug Use: No  . Sexual Activity: Not on file   Other Topics Concern  . Not on file   Social History Narrative   Grew up in [place]. Mother was [place], Father [place].    Past Surgical History  Procedure Laterality Date  . Cardiac catheterization    . Coronary stent placement      3 stents /1997  . Colonoscopy    . Polypectomy      Family History  Problem Relation Age of Onset  . Cancer Mother   . Heart disease Father   . Heart disease Sister   . Diabetes Sister   . Colon cancer Sister     Allergies  Allergen Reactions  . Lipitor [Atorvastatin Calcium] Other (See Comments)    Muscle soreness    Current Outpatient Prescriptions on File Prior to Visit    Medication Sig Dispense Refill  . aspirin 81 MG tablet Take 81 mg by mouth daily.        . fluticasone (FLONASE) 50 MCG/ACT nasal spray Place 2 sprays into the nose daily as needed.  16 g  3  . Insulin Pen Needle (FIFTY50 PEN NEEDLES) 31G X 8 MM MISC Use twice daily as directed  100 each  3  . mirabegron ER (MYRBETRIQ) 25 MG TB24 Take 50 mg by mouth daily.      . simvastatin (ZOCOR) 20 MG tablet TAKE ONE TABLET BY MOUTH NIGHTLY AT BEDTIME   30 tablet  2  . Tamsulosin HCl (FLOMAX) 0.4 MG CAPS Take 0.4 mg by mouth daily.        . TOVIAZ 8 MG TB24 tablet Take 1 tablet by mouth daily.      Marland Kitchen VICTOZA 18 MG/3ML SOPN Inject 0.59mL into the skin daily.  6 mL  3   No current facility-administered medications on file prior to visit.    BP 126/60  Temp(Src) 98.8 F (37.1 C) (Oral)  Wt 295 lb (133.811 kg)    Objective:   Physical Exam  Constitutional: He is oriented to person, place, and time. He appears well-developed and well-nourished. No distress.  Cardiovascular: Normal rate, regular rhythm and normal heart sounds.   No murmur heard. Pulmonary/Chest: Effort normal and breath sounds normal. He has no wheezes.  Musculoskeletal:  Bilateral trace lower ext edema  Neurological: He is alert and oriented to person, place, and time. No cranial nerve deficit.  Skin:  Chronic venous stasis changes of lower extremities bilaterally  Psychiatric: He has a normal mood and affect. His behavior is normal.          Assessment & Plan:

## 2014-01-23 NOTE — Patient Instructions (Signed)
Please complete the following lab tests before your next follow up appointment: BMET, A1c - 250.02 FLP, LFTs, TSH - 272.4

## 2014-01-23 NOTE — Assessment & Plan Note (Signed)
Patient's morning blood sugar approximately 130's with titrating Lantus to 30 units. Patient understands that he can titrate up to 35 units at bedtime. Monitor A1c. Low carbohydrate diet encouraged. Exercise limited by osteoarthritis of both knees. Lab Results  Component Value Date   HGBA1C 8.8* 10/25/2013

## 2014-01-23 NOTE — Assessment & Plan Note (Signed)
Patient complains of pruritus of right external auditory canal. On exam his auditory canal is unremarkable. Trial of over-the-counter hydrocortisone cream twice a day for 1 to 2 weeks.

## 2014-01-23 NOTE — Assessment & Plan Note (Signed)
Stable. BP: 126/60 mmHg

## 2014-01-24 LAB — BASIC METABOLIC PANEL
BUN: 23 mg/dL (ref 6–23)
CO2: 27 mEq/L (ref 19–32)
CREATININE: 1.2 mg/dL (ref 0.4–1.5)
Calcium: 9 mg/dL (ref 8.4–10.5)
Chloride: 106 mEq/L (ref 96–112)
GFR: 63.53 mL/min (ref >60.00)
Glucose, Bld: 159 mg/dL — ABNORMAL HIGH (ref 70–99)
POTASSIUM: 3.9 meq/L (ref 3.5–5.1)
Sodium: 140 mEq/L (ref 135–145)

## 2014-01-24 LAB — HEMOGLOBIN A1C: HEMOGLOBIN A1C: 7.5 % — AB (ref 4.6–6.5)

## 2014-01-24 MED ORDER — INSULIN GLARGINE 100 UNIT/ML ~~LOC~~ SOLN: SUBCUTANEOUS | Status: DC

## 2014-01-24 NOTE — Telephone Encounter (Signed)
Future orders placed, pt aware, he will go end of August.  Pt wants to change his insulin to a vial cause its cheaper.  Please advise

## 2014-01-24 NOTE — Telephone Encounter (Signed)
rx sent in electronically, called pharmacy and cancelled the lantus pens rx

## 2014-01-24 NOTE — Telephone Encounter (Signed)
Ok to change insulin to vial.  Send in rx for insulin syringes.

## 2014-03-02 ENCOUNTER — Other Ambulatory Visit: Payer: Self-pay | Admitting: Internal Medicine

## 2014-04-21 ENCOUNTER — Telehealth: Payer: Self-pay | Admitting: Internal Medicine

## 2014-04-21 MED ORDER — INSULIN GLARGINE 300 UNIT/ML ~~LOC~~ SOPN: 30.0000 [IU] | PEN_INJECTOR | Freq: Every day | SUBCUTANEOUS | Status: DC

## 2014-04-21 NOTE — Telephone Encounter (Signed)
Pt req a call back . He has questions about his insulin

## 2014-04-21 NOTE — Telephone Encounter (Signed)
Pt wanting samples of toujeo.  Ok per Dr Shawna Orleans, sample's are in the refrigerator on IM side, pt aware ok to p/u

## 2014-04-26 ENCOUNTER — Ambulatory Visit: Payer: Medicare Other | Admitting: Internal Medicine

## 2014-04-27 ENCOUNTER — Other Ambulatory Visit (INDEPENDENT_AMBULATORY_CARE_PROVIDER_SITE_OTHER): Payer: Medicare Other

## 2014-04-27 DIAGNOSIS — IMO0001 Reserved for inherently not codable concepts without codable children: Secondary | ICD-10-CM

## 2014-04-27 DIAGNOSIS — R7989 Other specified abnormal findings of blood chemistry: Secondary | ICD-10-CM

## 2014-04-27 DIAGNOSIS — E1165 Type 2 diabetes mellitus with hyperglycemia: Principal | ICD-10-CM

## 2014-04-27 DIAGNOSIS — E785 Hyperlipidemia, unspecified: Secondary | ICD-10-CM | POA: Diagnosis not present

## 2014-04-27 LAB — LIPID PANEL
CHOLESTEROL: 154 mg/dL (ref 0–200)
HDL: 32.3 mg/dL — ABNORMAL LOW (ref >39.00)
NonHDL: 121.7
Total CHOL/HDL Ratio: 5
Triglycerides: 207 mg/dL — ABNORMAL HIGH (ref 0.0–149.0)
VLDL: 41.4 mg/dL — ABNORMAL HIGH (ref 0.0–40.0)

## 2014-04-27 LAB — BASIC METABOLIC PANEL
BUN: 17 mg/dL (ref 6–23)
CALCIUM: 9.1 mg/dL (ref 8.4–10.5)
CHLORIDE: 106 meq/L (ref 96–112)
CO2: 26 meq/L (ref 19–32)
Creatinine, Ser: 0.8 mg/dL (ref 0.4–1.5)
GFR: 104.37 mL/min (ref >60.00)
Glucose, Bld: 127 mg/dL — ABNORMAL HIGH (ref 70–99)
Potassium: 4.5 mEq/L (ref 3.5–5.1)
SODIUM: 141 meq/L (ref 135–145)

## 2014-04-27 LAB — HEPATIC FUNCTION PANEL
ALK PHOS: 55 U/L (ref 39–117)
ALT: 59 U/L — ABNORMAL HIGH (ref 0–53)
AST: 38 U/L — ABNORMAL HIGH (ref 0–37)
Albumin: 4.1 g/dL (ref 3.5–5.2)
BILIRUBIN DIRECT: 0.1 mg/dL (ref 0.0–0.3)
TOTAL PROTEIN: 7.5 g/dL (ref 6.0–8.3)
Total Bilirubin: 0.9 mg/dL (ref 0.2–1.2)

## 2014-04-27 LAB — LDL CHOLESTEROL, DIRECT: Direct LDL: 88.2 mg/dL

## 2014-04-27 LAB — HEMOGLOBIN A1C: HEMOGLOBIN A1C: 6.8 % — AB (ref 4.6–6.5)

## 2014-04-27 LAB — TSH: TSH: 0.32 u[IU]/mL — ABNORMAL LOW (ref 0.35–4.50)

## 2014-05-02 ENCOUNTER — Ambulatory Visit (INDEPENDENT_AMBULATORY_CARE_PROVIDER_SITE_OTHER): Payer: Medicare Other | Admitting: Internal Medicine

## 2014-05-02 ENCOUNTER — Encounter: Payer: Self-pay | Admitting: Internal Medicine

## 2014-05-02 VITALS — BP 124/60 | HR 84 | Temp 99.1°F | Ht 71.0 in | Wt 296.0 lb

## 2014-05-02 DIAGNOSIS — E119 Type 2 diabetes mellitus without complications: Secondary | ICD-10-CM | POA: Diagnosis not present

## 2014-05-02 DIAGNOSIS — M19049 Primary osteoarthritis, unspecified hand: Secondary | ICD-10-CM

## 2014-05-02 DIAGNOSIS — G5600 Carpal tunnel syndrome, unspecified upper limb: Secondary | ICD-10-CM

## 2014-05-02 DIAGNOSIS — E785 Hyperlipidemia, unspecified: Secondary | ICD-10-CM | POA: Diagnosis not present

## 2014-05-02 DIAGNOSIS — I2510 Atherosclerotic heart disease of native coronary artery without angina pectoris: Secondary | ICD-10-CM

## 2014-05-02 DIAGNOSIS — M19042 Primary osteoarthritis, left hand: Secondary | ICD-10-CM

## 2014-05-02 DIAGNOSIS — G5602 Carpal tunnel syndrome, left upper limb: Secondary | ICD-10-CM

## 2014-05-02 DIAGNOSIS — M19041 Primary osteoarthritis, right hand: Secondary | ICD-10-CM

## 2014-05-02 DIAGNOSIS — I10 Essential (primary) hypertension: Secondary | ICD-10-CM | POA: Diagnosis not present

## 2014-05-02 MED ORDER — LIRAGLUTIDE 18 MG/3ML ~~LOC~~ SOPN: 1.8000 mg | PEN_INJECTOR | Freq: Every day | SUBCUTANEOUS | Status: DC

## 2014-05-02 MED ORDER — DICLOFENAC SODIUM 1 % TD GEL: 2.0000 g | Freq: Four times a day (QID) | TRANSDERMAL | Status: DC

## 2014-05-02 NOTE — Assessment & Plan Note (Signed)
LDL at goal.  Continue statin therapy

## 2014-05-02 NOTE — Progress Notes (Signed)
Subjective:    Patient ID: Jacob Bishop, male    DOB: Sep 10, 1940, 73 y.o.   MRN: [id number]  HPI  73 year old white male with history of morbid obesity, type 2 diabetes and hypertension for routine followup. Patient reports his blood sugar control has improved since previous visit. His weight is stable.  His A1c improved to 6.8. His other lab results reviewed in detail. He has slightly suppressed TSH. He denies any symptoms of hyperthyroidism.  Patient tolerating Lantus. He denies any hypoglycemia. He still struggles with controlling his appetite.  Patient complains of intermittent tingling in his hands especially on left side.  Review of Systems Negative for weight changes, negative for chest pain    Past Medical History  Diagnosis Date  . Hypertension   . Diabetes mellitus     type II  . CAD in native artery   . Hyperlipidemia   . Edema   . Obesity   . OSA (obstructive sleep apnea)   . Cellulitis   . Dermatitis   . Rhinosinusitis   . Skin lesion   . Positional vertigo   . Hx of colonic polyps   . History of kidney stones     History   Social History  . Marital Status: Married    Spouse Name: N/A    Number of Children: N/A  . Years of Education: N/A   Occupational History  . OWNER, restaurant    Social History Main Topics  . Smoking status: Former Smoker    Quit date: 01/10/1996  . Smokeless tobacco: Never Used  . Alcohol Use: No  . Drug Use: No  . Sexual Activity: Not on file   Other Topics Concern  . Not on file   Social History Narrative   Grew up in [place]. Mother was [place], Father [place].    Past Surgical History  Procedure Laterality Date  . Cardiac catheterization    . Coronary stent placement      3 stents /1997  . Colonoscopy    . Polypectomy      Family History  Problem Relation Age of Onset  . Cancer Mother   . Heart disease Father   . Heart disease Sister   . Diabetes Sister   . Colon cancer Sister     Allergies   Allergen Reactions  . Lipitor [Atorvastatin Calcium] Other (See Comments)    Muscle soreness    Current Outpatient Prescriptions on File Prior to Visit  Medication Sig Dispense Refill  . amLODipine (NORVASC) 2.5 MG tablet Take 1 tablet (2.5 mg total) by mouth daily. Take one-half tablet by mouth daily  90 tablet  1  . aspirin 81 MG tablet Take 81 mg by mouth daily.        . fluticasone (FLONASE) 50 MCG/ACT nasal spray Place 2 sprays into the nose daily as needed.  16 g  3  . Insulin Glargine (TOUJEO SOLOSTAR) 300 UNIT/ML SOPN Inject 30 Units into the skin at bedtime.  2 pen  0  . Insulin Pen Needle (FIFTY50 PEN NEEDLES) 31G X 8 MM MISC Use twice daily as directed  100 each  3  . mirabegron ER (MYRBETRIQ) 25 MG TB24 Take 50 mg by mouth daily.      . simvastatin (ZOCOR) 20 MG tablet TAKE ONE TABLET BY MOUTH AT BEDTIME   30 tablet  1  . Tamsulosin HCl (FLOMAX) 0.4 MG CAPS Take 0.4 mg by mouth daily.        Marland Kitchen  TOVIAZ 8 MG TB24 tablet Take 1 tablet by mouth daily.       No current facility-administered medications on file prior to visit.    BP 124/60  Pulse 84  Temp(Src) 99.1 F (37.3 C) (Oral)  Ht 5\' 11"  (1.803 m)  Wt 296 lb (134.265 kg)  BMI 41.30 kg/m2    Objective:   Physical Exam  Constitutional: He is oriented to person, place, and time. He appears well-developed and well-nourished. No distress.  HENT:  Head: Normocephalic and atraumatic.  Neck: Neck supple.  No carotid bruit  Cardiovascular: Normal rate, regular rhythm and normal heart sounds.   No murmur heard. Pulmonary/Chest: Effort normal and breath sounds normal. He has no wheezes.  Musculoskeletal: He exhibits edema.  Positive Phalen's and Tinel's test of left wrist  Neurological: He is alert and oriented to person, place, and time. No cranial nerve deficit.  Skin: Skin is warm and dry.  Psychiatric: He has a normal mood and affect. His behavior is normal.          Assessment & Plan:

## 2014-05-02 NOTE — Assessment & Plan Note (Signed)
Well controlled on amlodipine.  Consider lower dose of amlodipine and adding low dose ramipril. BP: 124/60 mmHg

## 2014-05-02 NOTE — Assessment & Plan Note (Signed)
Patient has signs and symptoms of mild carpal tunnel syndrome of left wrist. Patient advised to perform stretching exercises and use left wrist splint (Nocturnally) for the next 4-8 weeks.  Consider referral to hand specialist if worsening symptoms.

## 2014-05-02 NOTE — Patient Instructions (Addendum)
Use left wrist splint as directed for 1-2 months Please complete the following lab tests before your next follow up appointment: BMET, A1c, microalb / cr ratio - 250.00

## 2014-05-02 NOTE — Progress Notes (Signed)
Pre visit review using our clinic review tool, if applicable. No additional management support is needed unless otherwise documented below in the visit note. 

## 2014-05-02 NOTE — Assessment & Plan Note (Signed)
Blood sugar control improving with addition of Lantus. He is still struggling with appetite control. Increase Victoza 1.8 mg subcutaneous once daily. I encouraged regular nonweightbearing exercises (stationary bike).  Lab Results  Component Value Date   HGBA1C 6.8* 04/27/2014   HGBA1C 7.5* 01/23/2014   HGBA1C 8.8* 10/25/2013   Lab Results  Component Value Date   MICROALBUR 1.5 07/13/2013   LDLCALC 80 03/31/2013   CREATININE 0.8 04/27/2014

## 2014-05-04 ENCOUNTER — Telehealth: Payer: Self-pay | Admitting: Internal Medicine

## 2014-05-04 NOTE — Telephone Encounter (Signed)
The diagnosis on the pt's problem list and office notes states he has CTS of left wrist.  Can you change the diagnosis to match the below note along with reasons why the patient cannot take oral NSAID's?  Then I can resubmit the PA as an appeal along with proper documentation.

## 2014-05-04 NOTE — Telephone Encounter (Signed)
PA for Voltaren gel was denied. Medication is not supported by FDA for carpal tunnel syndrome.

## 2014-05-04 NOTE — Telephone Encounter (Signed)
It is not being prescribed for his CTS.  He has osteoarthritis in his hand esp at the base of this thumbs.

## 2014-05-05 DIAGNOSIS — M19049 Primary osteoarthritis, unspecified hand: Secondary | ICD-10-CM | POA: Insufficient documentation

## 2014-05-05 NOTE — Assessment & Plan Note (Signed)
He also complains of pain in both hands mainly at the base of this thumbs.  I suspect osteoarthritis.  I suggest avoiding NSAIDs considering history of diabetes and hypertension.  Use voltaren gel as directed.

## 2014-05-05 NOTE — Telephone Encounter (Signed)
I've submitted an APPEAL for the medication.

## 2014-05-05 NOTE — Telephone Encounter (Signed)
Office note revised.

## 2014-05-08 NOTE — Telephone Encounter (Signed)
APPEAL was APPROVED. 05/03/14-05/08/14 ref #[id number]. Pharmacy has been contacted

## 2014-05-10 ENCOUNTER — Other Ambulatory Visit: Payer: Self-pay | Admitting: Internal Medicine

## 2014-05-15 ENCOUNTER — Encounter: Payer: Self-pay | Admitting: Internal Medicine

## 2014-06-14 ENCOUNTER — Telehealth: Payer: Self-pay | Admitting: Internal Medicine

## 2014-06-14 NOTE — Telephone Encounter (Signed)
Pt called to say that he can not afford  Liraglutide (VICTOZA) 18 MG/3ML SOPN. He is asking if he need to take this and if so is there something cheaper like a generic

## 2014-06-14 NOTE — Telephone Encounter (Signed)
No generic.  See if his ins co's formulary covers other GLP-1 agonists.  Ok to provide samples if available

## 2014-06-16 ENCOUNTER — Other Ambulatory Visit: Payer: Self-pay | Admitting: Internal Medicine

## 2014-06-19 DIAGNOSIS — N200 Calculus of kidney: Secondary | ICD-10-CM | POA: Diagnosis not present

## 2014-06-19 DIAGNOSIS — R350 Frequency of micturition: Secondary | ICD-10-CM | POA: Diagnosis not present

## 2014-06-19 DIAGNOSIS — N401 Enlarged prostate with lower urinary tract symptoms: Secondary | ICD-10-CM | POA: Diagnosis not present

## 2014-06-19 DIAGNOSIS — N3941 Urge incontinence: Secondary | ICD-10-CM | POA: Diagnosis not present

## 2014-06-19 MED ORDER — LIRAGLUTIDE 18 MG/3ML ~~LOC~~ SOPN: 1.8000 mg | PEN_INJECTOR | Freq: Every day | SUBCUTANEOUS | Status: DC

## 2014-06-19 MED ORDER — INSULIN GLARGINE 300 UNIT/ML ~~LOC~~ SOPN: 30.0000 [IU] | PEN_INJECTOR | Freq: Every day | SUBCUTANEOUS | Status: DC

## 2014-06-19 NOTE — Telephone Encounter (Signed)
Sample for toujeo and victoza are ready for p/u.  Pt aware

## 2014-06-21 ENCOUNTER — Encounter: Payer: Self-pay | Admitting: Family Medicine

## 2014-06-21 ENCOUNTER — Ambulatory Visit (INDEPENDENT_AMBULATORY_CARE_PROVIDER_SITE_OTHER): Payer: Medicare Other | Admitting: Family Medicine

## 2014-06-21 VITALS — BP 134/75 | HR 77 | Temp 98.2°F | Ht 71.0 in | Wt 301.0 lb

## 2014-06-21 DIAGNOSIS — R5383 Other fatigue: Secondary | ICD-10-CM

## 2014-06-21 DIAGNOSIS — E119 Type 2 diabetes mellitus without complications: Secondary | ICD-10-CM | POA: Diagnosis not present

## 2014-06-21 DIAGNOSIS — G4733 Obstructive sleep apnea (adult) (pediatric): Secondary | ICD-10-CM

## 2014-06-21 DIAGNOSIS — I2510 Atherosclerotic heart disease of native coronary artery without angina pectoris: Secondary | ICD-10-CM | POA: Diagnosis not present

## 2014-06-21 DIAGNOSIS — I10 Essential (primary) hypertension: Secondary | ICD-10-CM | POA: Diagnosis not present

## 2014-06-21 NOTE — Progress Notes (Signed)
   Subjective:    Patient ID: Jacob Bishop, male    DOB: Apr 28, 1941, 73 y.o.   MRN: [id number]  HPI Here complaining of feeling very fatigued for the past month. No other sx, specifically no chest pains or SOB or cough or fever. He was treated for a pneumonia at urgent care in April and he recovered well. He saw Dr. Shawna Orleans for follow up and he had a normal CXR after that. He had normal labs last month including a normal TSH and a Hgb of 6.8. He has a hx of sleep apnea.    Review of Systems  Constitutional: Positive for fatigue. Negative for fever, chills and diaphoresis.  Respiratory: Negative.   Cardiovascular: Negative.   Gastrointestinal: Negative.   Neurological: Negative.        Objective:   Physical Exam  Constitutional: He is oriented to person, place, and time. He appears well-developed and well-nourished.  Neck: No thyromegaly present.  Cardiovascular: Normal rate, regular rhythm, normal heart sounds and intact distal pulses.   Pulmonary/Chest: Effort normal and breath sounds normal.  Lymphadenopathy:    He has no cervical adenopathy.  Neurological: He is alert and oriented to person, place, and time.          Assessment & Plan:  It is not clear what may be causing his fatigue. Check a CBC today to rule out anemia. This could be from the sleep apnea. Follow up with Dr. Shawna Orleans

## 2014-06-21 NOTE — Progress Notes (Signed)
Pre visit review using our clinic review tool, if applicable. No additional management support is needed unless otherwise documented below in the visit note. 

## 2014-06-22 LAB — CBC WITH DIFFERENTIAL/PLATELET
Basophils Absolute: 0 10*3/uL (ref 0.0–0.1)
Basophils Relative: 0.2 % (ref 0.0–3.0)
EOS ABS: 0.1 10*3/uL (ref 0.0–0.7)
Eosinophils Relative: 2 % (ref 0.0–5.0)
HCT: 41.1 % (ref 39.0–52.0)
HEMOGLOBIN: 14 g/dL (ref 13.0–17.0)
LYMPHS PCT: 43.7 % (ref 12.0–46.0)
Lymphs Abs: 2 10*3/uL (ref 0.7–4.0)
MCHC: 34 g/dL (ref 30.0–36.0)
MCV: 94.1 fl (ref 78.0–100.0)
MONO ABS: 0.7 10*3/uL (ref 0.1–1.0)
Monocytes Relative: 15 % — ABNORMAL HIGH (ref 3.0–12.0)
NEUTROS ABS: 1.8 10*3/uL (ref 1.4–7.7)
Neutrophils Relative %: 39.1 % — ABNORMAL LOW (ref 43.0–77.0)
Platelets: 108 10*3/uL — ABNORMAL LOW (ref 150.0–400.0)
RBC: 4.37 Mil/uL (ref 4.22–5.81)
RDW: 13.6 % (ref 11.5–15.5)
WBC: 4.5 10*3/uL (ref 4.0–10.5)

## 2014-07-04 DIAGNOSIS — N3941 Urge incontinence: Secondary | ICD-10-CM | POA: Diagnosis not present

## 2014-07-11 DIAGNOSIS — N3941 Urge incontinence: Secondary | ICD-10-CM | POA: Diagnosis not present

## 2014-07-16 ENCOUNTER — Other Ambulatory Visit: Payer: Self-pay | Admitting: Internal Medicine

## 2014-07-18 DIAGNOSIS — N3941 Urge incontinence: Secondary | ICD-10-CM | POA: Diagnosis not present

## 2014-07-25 DIAGNOSIS — N3941 Urge incontinence: Secondary | ICD-10-CM | POA: Diagnosis not present

## 2014-07-28 ENCOUNTER — Other Ambulatory Visit: Payer: Self-pay | Admitting: Internal Medicine

## 2014-08-01 DIAGNOSIS — N3941 Urge incontinence: Secondary | ICD-10-CM | POA: Diagnosis not present

## 2014-08-08 DIAGNOSIS — N3941 Urge incontinence: Secondary | ICD-10-CM | POA: Diagnosis not present

## 2014-08-28 ENCOUNTER — Other Ambulatory Visit (INDEPENDENT_AMBULATORY_CARE_PROVIDER_SITE_OTHER): Payer: Medicare Other

## 2014-08-28 DIAGNOSIS — E119 Type 2 diabetes mellitus without complications: Secondary | ICD-10-CM | POA: Diagnosis not present

## 2014-08-28 LAB — BASIC METABOLIC PANEL
BUN: 20 mg/dL (ref 6–23)
CHLORIDE: 106 meq/L (ref 96–112)
CO2: 25 mEq/L (ref 19–32)
Calcium: 8.7 mg/dL (ref 8.4–10.5)
Creatinine, Ser: 1.1 mg/dL (ref 0.4–1.5)
GFR: 67.29 mL/min (ref >60.00)
GLUCOSE: 257 mg/dL — AB (ref 70–99)
Potassium: 4.1 mEq/L (ref 3.5–5.1)
Sodium: 138 mEq/L (ref 135–145)

## 2014-08-28 LAB — HEMOGLOBIN A1C: HEMOGLOBIN A1C: 7.7 % — AB (ref 4.6–6.5)

## 2014-08-29 DIAGNOSIS — N3941 Urge incontinence: Secondary | ICD-10-CM | POA: Diagnosis not present

## 2014-09-04 ENCOUNTER — Ambulatory Visit: Payer: Medicare Other | Admitting: Internal Medicine

## 2014-09-05 DIAGNOSIS — N3941 Urge incontinence: Secondary | ICD-10-CM | POA: Diagnosis not present

## 2014-09-12 DIAGNOSIS — N3941 Urge incontinence: Secondary | ICD-10-CM | POA: Diagnosis not present

## 2014-09-14 DIAGNOSIS — H5203 Hypermetropia, bilateral: Secondary | ICD-10-CM | POA: Diagnosis not present

## 2014-09-14 DIAGNOSIS — H2513 Age-related nuclear cataract, bilateral: Secondary | ICD-10-CM | POA: Diagnosis not present

## 2014-09-14 DIAGNOSIS — H524 Presbyopia: Secondary | ICD-10-CM | POA: Diagnosis not present

## 2014-09-14 DIAGNOSIS — E119 Type 2 diabetes mellitus without complications: Secondary | ICD-10-CM | POA: Diagnosis not present

## 2014-09-14 LAB — HM DIABETES EYE EXAM

## 2014-09-19 DIAGNOSIS — N3941 Urge incontinence: Secondary | ICD-10-CM | POA: Diagnosis not present

## 2014-09-26 DIAGNOSIS — N3941 Urge incontinence: Secondary | ICD-10-CM | POA: Diagnosis not present

## 2014-09-29 ENCOUNTER — Ambulatory Visit: Payer: Medicare Other | Admitting: Internal Medicine

## 2014-10-03 DIAGNOSIS — N3941 Urge incontinence: Secondary | ICD-10-CM | POA: Diagnosis not present

## 2014-10-09 ENCOUNTER — Other Ambulatory Visit: Payer: Self-pay | Admitting: Internal Medicine

## 2014-10-24 DIAGNOSIS — N3941 Urge incontinence: Secondary | ICD-10-CM | POA: Diagnosis not present

## 2014-10-27 ENCOUNTER — Encounter: Payer: Self-pay | Admitting: Internal Medicine

## 2014-10-27 ENCOUNTER — Ambulatory Visit (INDEPENDENT_AMBULATORY_CARE_PROVIDER_SITE_OTHER): Payer: Medicare Other | Admitting: Internal Medicine

## 2014-10-27 VITALS — BP 110/70 | HR 84 | Temp 98.1°F | Wt 301.0 lb

## 2014-10-27 DIAGNOSIS — I10 Essential (primary) hypertension: Secondary | ICD-10-CM

## 2014-10-27 DIAGNOSIS — E1165 Type 2 diabetes mellitus with hyperglycemia: Secondary | ICD-10-CM | POA: Diagnosis not present

## 2014-10-27 DIAGNOSIS — IMO0002 Reserved for concepts with insufficient information to code with codable children: Secondary | ICD-10-CM

## 2014-10-27 MED ORDER — SIMVASTATIN 20 MG PO TABS: 20.0000 mg | ORAL_TABLET | Freq: Every day | ORAL | Status: DC

## 2014-10-27 MED ORDER — NATEGLINIDE 120 MG PO TABS: ORAL_TABLET | ORAL | Status: DC

## 2014-10-27 MED ORDER — LANTUS SOLOSTAR 100 UNIT/ML ~~LOC~~ SOPN: 35.0000 [IU] | PEN_INJECTOR | Freq: Every day | SUBCUTANEOUS | Status: DC

## 2014-10-27 MED ORDER — AMLODIPINE BESYLATE 2.5 MG PO TABS: 2.5000 mg | ORAL_TABLET | Freq: Every day | ORAL | Status: DC

## 2014-10-27 NOTE — Assessment & Plan Note (Signed)
Worsening blood sugar control.  Continue Lantus and Victoza.  He would like to avoid mealtime insulin injections if possible. Trial of Starlix 60-120 mg twice a day before meals. Lab Results  Component Value Date   HGBA1C 7.7* 08/28/2014

## 2014-10-27 NOTE — Progress Notes (Signed)
Subjective:    Patient ID: Jacob Bishop, male    DOB: 1940-10-14, 74 y.o.   MRN: [id number]  HPI  74 year old white male with history of coronary artery disease, hypertension and type 2 diabetes for follow-up. Patient denies any significant interval medical history.  Type 2 diabetes-patient reports his fasting blood sugar is usually right around 150. His last A1c completed January 2016. Reports dietary compliance has been fair. His current diabetic regimen reviewed. He denies any episodes of hypoglycemia.  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  His hrs - 5 AM to 3 PM  Review of Systems Negative for chest pain or shortness of breath,  Patient reports diabetic eye exam completed by Dr. Satira Sark in February 2016. Negative for diabetic retinopathy.    Past Medical History  Diagnosis Date  . Hypertension   . Diabetes mellitus     type II  . CAD in native artery   . Hyperlipidemia   . Edema   . Obesity   . OSA (obstructive sleep apnea)   . Cellulitis   . Dermatitis   . Rhinosinusitis   . Skin lesion   . Positional vertigo   . Hx of colonic polyps   . History of kidney stones     History   Social History  . Marital Status: Married    Spouse Name: N/A  . Number of Children: N/A  . Years of Education: N/A   Occupational History  . OWNER, restaurant    Social History Main Topics  . Smoking status: Former Smoker    Quit date: 01/10/1996  . Smokeless tobacco: Never Used  . Alcohol Use: No  . Drug Use: No  . Sexual Activity: Not on file   Other Topics Concern  . Not on file   Social History Narrative   Grew up in [place]. Mother was [place], Father [place].    Past Surgical History  Procedure Laterality Date  . Cardiac catheterization    . Coronary stent placement      3 stents /1997  . Colonoscopy    . Polypectomy      Family History  Problem Relation Age of Onset  . Cancer Mother   . Heart disease Father     . Heart disease Sister   . Diabetes Sister   . Colon cancer Sister     Allergies  Allergen Reactions  . Lipitor [Atorvastatin Calcium] Other (See Comments)    Muscle soreness    Current Outpatient Prescriptions on File Prior to Visit  Medication Sig Dispense Refill  . aspirin 81 MG tablet Take 81 mg by mouth daily.      . diclofenac sodium (VOLTAREN) 1 % GEL Apply 2 g topically 4 (four) times daily. 2 Tube 2  . fluticasone (FLONASE) 50 MCG/ACT nasal spray Place 2 sprays into the nose daily as needed. 16 g 3  . Insulin Pen Needle (FIFTY50 PEN NEEDLES) 31G X 8 MM MISC Use twice daily as directed 100 each 3  . TOVIAZ 8 MG TB24 tablet Take 1 tablet by mouth daily.    Marland Kitchen VICTOZA 18 MG/3ML SOPN INJECT 0.2MLS INTO THE SKIN EVERY DAY AS DIRECTED 3 mL 1   No current facility-administered medications on file prior to visit.    BP 110/70 mmHg  Pulse 84  Temp(Src) 98.1 F (36.7 C) (Oral)  Wt 301 lb (136.533 kg)    Objective:   Physical Exam  Constitutional: He is oriented to person,  place, and time. He appears well-developed and well-nourished. No distress.  Cardiovascular: Normal rate, regular rhythm and normal heart sounds.   No murmur heard. Pulmonary/Chest: Effort normal and breath sounds normal. He has no wheezes.  Musculoskeletal:  Trace lower ext edema  Neurological: He is alert and oriented to person, place, and time. No cranial nerve deficit.  Psychiatric: He has a normal mood and affect. His behavior is normal.          Assessment & Plan:

## 2014-10-27 NOTE — Assessment & Plan Note (Signed)
Well controlled.  No change in medication regimen. BP: 110/70 mmHg

## 2014-10-27 NOTE — Progress Notes (Signed)
Pre visit review using our clinic review tool, if applicable. No additional management support is needed unless otherwise documented below in the visit note. 

## 2014-10-27 NOTE — Patient Instructions (Signed)
Please complete the following lab tests before your next follow up appointment:  BMET, A1c - 250.02 

## 2014-11-13 DIAGNOSIS — N3941 Urge incontinence: Secondary | ICD-10-CM | POA: Diagnosis not present

## 2014-11-20 ENCOUNTER — Encounter: Payer: Self-pay | Admitting: Internal Medicine

## 2014-12-05 DIAGNOSIS — N3941 Urge incontinence: Secondary | ICD-10-CM | POA: Diagnosis not present

## 2014-12-18 DIAGNOSIS — N401 Enlarged prostate with lower urinary tract symptoms: Secondary | ICD-10-CM | POA: Diagnosis not present

## 2014-12-21 ENCOUNTER — Other Ambulatory Visit (INDEPENDENT_AMBULATORY_CARE_PROVIDER_SITE_OTHER): Payer: Medicare Other

## 2014-12-21 DIAGNOSIS — E119 Type 2 diabetes mellitus without complications: Secondary | ICD-10-CM | POA: Diagnosis not present

## 2014-12-21 DIAGNOSIS — I10 Essential (primary) hypertension: Secondary | ICD-10-CM | POA: Diagnosis not present

## 2014-12-21 LAB — HEMOGLOBIN A1C: Hgb A1c MFr Bld: 7.8 % — ABNORMAL HIGH (ref 4.6–6.5)

## 2014-12-21 LAB — BASIC METABOLIC PANEL
BUN: 16 mg/dL (ref 6–23)
CALCIUM: 8.7 mg/dL (ref 8.4–10.5)
CO2: 27 meq/L (ref 19–32)
CREATININE: 0.88 mg/dL (ref 0.40–1.50)
Chloride: 104 mEq/L (ref 96–112)
GFR: 90.63 mL/min (ref >60.00)
Glucose, Bld: 274 mg/dL — ABNORMAL HIGH (ref 70–99)
Potassium: 3.9 mEq/L (ref 3.5–5.1)
Sodium: 137 mEq/L (ref 135–145)

## 2014-12-26 DIAGNOSIS — N3941 Urge incontinence: Secondary | ICD-10-CM | POA: Diagnosis not present

## 2014-12-29 ENCOUNTER — Ambulatory Visit (INDEPENDENT_AMBULATORY_CARE_PROVIDER_SITE_OTHER): Payer: Medicare Other | Admitting: Adult Health

## 2014-12-29 ENCOUNTER — Ambulatory Visit: Payer: PRIVATE HEALTH INSURANCE | Admitting: Internal Medicine

## 2014-12-29 ENCOUNTER — Other Ambulatory Visit: Payer: Self-pay | Admitting: Adult Health

## 2014-12-29 ENCOUNTER — Encounter: Payer: Self-pay | Admitting: Adult Health

## 2014-12-29 VITALS — BP 110/70 | Temp 98.5°F | Wt 301.0 lb

## 2014-12-29 DIAGNOSIS — G4733 Obstructive sleep apnea (adult) (pediatric): Secondary | ICD-10-CM

## 2014-12-29 DIAGNOSIS — I10 Essential (primary) hypertension: Secondary | ICD-10-CM

## 2014-12-29 DIAGNOSIS — E1165 Type 2 diabetes mellitus with hyperglycemia: Secondary | ICD-10-CM

## 2014-12-29 DIAGNOSIS — IMO0002 Reserved for concepts with insufficient information to code with codable children: Secondary | ICD-10-CM

## 2014-12-29 MED ORDER — LIRAGLUTIDE 18 MG/3ML ~~LOC~~ SOPN: PEN_INJECTOR | SUBCUTANEOUS | Status: DC

## 2014-12-29 MED ORDER — INSULIN PEN NEEDLE 32G X 4 MM MISC: 2.0000 | Freq: Every day | Status: DC

## 2014-12-29 MED ORDER — INSULIN GLARGINE 300 UNIT/ML ~~LOC~~ SOPN: 35.0000 [IU] | PEN_INJECTOR | Freq: Every day | SUBCUTANEOUS | Status: DC

## 2014-12-29 NOTE — Assessment & Plan Note (Signed)
Well controlled. BP in office today BP: 110/70 mmHg

## 2014-12-29 NOTE — Assessment & Plan Note (Signed)
He continues to have poor blood sugar control. Has not been taking the Starlix that was given to him during the last visit. He will restart taking this medication in the morning and at night. He was advised to inform me if it makes him feel tired or nauseated.   He will start taking his blood sugars in the morning and in the evening- writing them down and bringing to the office.   Work on diet and exercise.

## 2014-12-29 NOTE — Progress Notes (Signed)
Subjective:    Patient ID: Jacob Bishop, male    DOB: 12/05/1940, 74 y.o.   MRN: [id number]  HPI 74 year old caucasian male with uncontrolled DM2, patient of Dr. Shawna Orleans,  presents to the office today for 3 month follow up. His A1c continues to be elevated. He is not exercising and not following a diabetic diet. He has not been taking his Starlix because he felt as though it was making him fatigued.   Denies any other issues at this time.    Review of Systems  All other systems reviewed and are negative.  Past Medical History  Diagnosis Date  . Hypertension   . Diabetes mellitus     type II  . CAD in native artery   . Hyperlipidemia   . Edema   . Obesity   . OSA (obstructive sleep apnea)   . Cellulitis   . Dermatitis   . Rhinosinusitis   . Skin lesion   . Positional vertigo   . Hx of colonic polyps   . History of kidney stones     History   Social History  . Marital Status: Married    Spouse Name: N/A  . Number of Children: N/A  . Years of Education: N/A   Occupational History  . OWNER, restaurant    Social History Main Topics  . Smoking status: Former Smoker    Quit date: 01/10/1996  . Smokeless tobacco: Never Used  . Alcohol Use: No  . Drug Use: No  . Sexual Activity: Not on file   Other Topics Concern  . Not on file   Social History Narrative   Grew up in [place]. Mother was [place], Father [place].    Past Surgical History  Procedure Laterality Date  . Cardiac catheterization    . Coronary stent placement      3 stents /1997  . Colonoscopy    . Polypectomy      Family History  Problem Relation Age of Onset  . Cancer Mother   . Heart disease Father   . Heart disease Sister   . Diabetes Sister   . Colon cancer Sister     Allergies  Allergen Reactions  . Lipitor [Atorvastatin Calcium] Other (See Comments)    Muscle soreness    Current Outpatient Prescriptions on File Prior to Visit  Medication Sig Dispense Refill  . amLODipine  (NORVASC) 2.5 MG tablet Take 1 tablet (2.5 mg total) by mouth daily. 90 tablet 1  . aspirin 81 MG tablet Take 81 mg by mouth daily.      . diclofenac sodium (VOLTAREN) 1 % GEL Apply 2 g topically 4 (four) times daily. 2 Tube 2  . fluticasone (FLONASE) 50 MCG/ACT nasal spray Place 2 sprays into the nose daily as needed. 16 g 3  . LANTUS SOLOSTAR 100 UNIT/ML Solostar Pen Inject 35 Units into the skin daily. 5 pen 5  . simvastatin (ZOCOR) 20 MG tablet Take 1 tablet (20 mg total) by mouth at bedtime. 90 tablet 1  . TOVIAZ 8 MG TB24 tablet Take 1 tablet by mouth daily.    . nateglinide (STARLIX) 120 MG tablet Take 1/2 to one tablet as directed before AM and Evening Meal (Patient not taking: Reported on 12/29/2014) 60 tablet 3   No current facility-administered medications on file prior to visit.    BP 110/70 mmHg  Temp(Src) 98.5 F (36.9 C) (Oral)  Wt 301 lb (136.533 kg)  Objective:   Physical Exam  Constitutional: He is oriented to person, place, and time. He appears well-developed and well-nourished.  Obese   Cardiovascular: Normal rate, normal heart sounds and intact distal pulses.  Exam reveals no gallop and no friction rub.   No murmur heard. Pulmonary/Chest: Effort normal and breath sounds normal. No respiratory distress. He has no wheezes. He has no rales. He exhibits no tenderness.  Neurological: He is alert and oriented to person, place, and time. He has normal reflexes.  Skin: Skin is warm and dry. No rash noted. No erythema. No pallor.  Psychiatric: He has a normal mood and affect. His behavior is normal. Judgment and thought content normal.      Assessment & Plan:

## 2014-12-29 NOTE — Progress Notes (Signed)
Pre visit review using our clinic review tool, if applicable. No additional management support is needed unless otherwise documented below in the visit note. Lab Results  Component Value Date   HGBA1C 7.8* 12/21/2014   HGBA1C 7.7* 08/28/2014   HGBA1C 6.8* 04/27/2014   Lab Results  Component Value Date   MICROALBUR 1.5 07/13/2013   LDLCALC 80 03/31/2013   CREATININE 0.88 12/21/2014

## 2014-12-29 NOTE — Assessment & Plan Note (Signed)
Referred to Pulmonology for new CPAP

## 2014-12-30 ENCOUNTER — Other Ambulatory Visit: Payer: Self-pay | Admitting: Internal Medicine

## 2015-01-08 DIAGNOSIS — N3020 Other chronic cystitis without hematuria: Secondary | ICD-10-CM | POA: Diagnosis not present

## 2015-01-08 DIAGNOSIS — N401 Enlarged prostate with lower urinary tract symptoms: Secondary | ICD-10-CM | POA: Diagnosis not present

## 2015-01-08 DIAGNOSIS — N138 Other obstructive and reflux uropathy: Secondary | ICD-10-CM | POA: Diagnosis not present

## 2015-01-08 DIAGNOSIS — N200 Calculus of kidney: Secondary | ICD-10-CM | POA: Diagnosis not present

## 2015-01-16 DIAGNOSIS — N3941 Urge incontinence: Secondary | ICD-10-CM | POA: Diagnosis not present

## 2015-02-13 DIAGNOSIS — N3941 Urge incontinence: Secondary | ICD-10-CM | POA: Diagnosis not present

## 2015-02-27 ENCOUNTER — Institutional Professional Consult (permissible substitution): Payer: Medicare Other | Admitting: Pulmonary Disease

## 2015-03-06 DIAGNOSIS — N3941 Urge incontinence: Secondary | ICD-10-CM | POA: Diagnosis not present

## 2015-03-07 ENCOUNTER — Other Ambulatory Visit: Payer: Self-pay | Admitting: Internal Medicine

## 2015-03-19 DIAGNOSIS — M170 Bilateral primary osteoarthritis of knee: Secondary | ICD-10-CM | POA: Diagnosis not present

## 2015-03-19 DIAGNOSIS — M25561 Pain in right knee: Secondary | ICD-10-CM | POA: Diagnosis not present

## 2015-03-19 DIAGNOSIS — M25562 Pain in left knee: Secondary | ICD-10-CM | POA: Diagnosis not present

## 2015-03-19 DIAGNOSIS — R262 Difficulty in walking, not elsewhere classified: Secondary | ICD-10-CM | POA: Diagnosis not present

## 2015-03-26 DIAGNOSIS — M25562 Pain in left knee: Secondary | ICD-10-CM | POA: Diagnosis not present

## 2015-03-26 DIAGNOSIS — M170 Bilateral primary osteoarthritis of knee: Secondary | ICD-10-CM | POA: Diagnosis not present

## 2015-03-26 DIAGNOSIS — M25561 Pain in right knee: Secondary | ICD-10-CM | POA: Diagnosis not present

## 2015-03-26 DIAGNOSIS — M1711 Unilateral primary osteoarthritis, right knee: Secondary | ICD-10-CM | POA: Diagnosis not present

## 2015-03-26 DIAGNOSIS — R2689 Other abnormalities of gait and mobility: Secondary | ICD-10-CM | POA: Diagnosis not present

## 2015-03-28 ENCOUNTER — Other Ambulatory Visit: Payer: Medicare Other

## 2015-03-29 ENCOUNTER — Other Ambulatory Visit (INDEPENDENT_AMBULATORY_CARE_PROVIDER_SITE_OTHER): Payer: Medicare Other

## 2015-03-29 DIAGNOSIS — E1165 Type 2 diabetes mellitus with hyperglycemia: Secondary | ICD-10-CM | POA: Diagnosis not present

## 2015-03-29 DIAGNOSIS — IMO0002 Reserved for concepts with insufficient information to code with codable children: Secondary | ICD-10-CM

## 2015-03-29 LAB — BASIC METABOLIC PANEL
BUN: 17 mg/dL (ref 6–23)
CHLORIDE: 103 meq/L (ref 96–112)
CO2: 29 mEq/L (ref 19–32)
Calcium: 9.1 mg/dL (ref 8.4–10.5)
Creatinine, Ser: 1.02 mg/dL (ref 0.40–1.50)
GFR: 76.38 mL/min (ref >60.00)
GLUCOSE: 263 mg/dL — AB (ref 70–99)
Potassium: 4.1 mEq/L (ref 3.5–5.1)
Sodium: 139 mEq/L (ref 135–145)

## 2015-03-29 LAB — HEMOGLOBIN A1C: Hgb A1c MFr Bld: 7.6 % — ABNORMAL HIGH (ref 4.6–6.5)

## 2015-03-30 DIAGNOSIS — M25562 Pain in left knee: Secondary | ICD-10-CM | POA: Diagnosis not present

## 2015-03-30 DIAGNOSIS — M1712 Unilateral primary osteoarthritis, left knee: Secondary | ICD-10-CM | POA: Diagnosis not present

## 2015-04-02 ENCOUNTER — Encounter: Payer: Self-pay | Admitting: Internal Medicine

## 2015-04-02 ENCOUNTER — Ambulatory Visit (INDEPENDENT_AMBULATORY_CARE_PROVIDER_SITE_OTHER): Payer: Medicare Other | Admitting: Internal Medicine

## 2015-04-02 VITALS — BP 126/66 | Temp 97.9°F | Ht 71.0 in | Wt 296.1 lb

## 2015-04-02 DIAGNOSIS — E1165 Type 2 diabetes mellitus with hyperglycemia: Secondary | ICD-10-CM

## 2015-04-02 DIAGNOSIS — I10 Essential (primary) hypertension: Secondary | ICD-10-CM | POA: Diagnosis not present

## 2015-04-02 DIAGNOSIS — IMO0002 Reserved for concepts with insufficient information to code with codable children: Secondary | ICD-10-CM

## 2015-04-02 DIAGNOSIS — E785 Hyperlipidemia, unspecified: Secondary | ICD-10-CM

## 2015-04-02 MED ORDER — NATEGLINIDE 120 MG PO TABS: ORAL_TABLET | ORAL | Status: DC

## 2015-04-02 MED ORDER — SIMVASTATIN 20 MG PO TABS: 20.0000 mg | ORAL_TABLET | Freq: Every day | ORAL | Status: DC

## 2015-04-02 MED ORDER — FLUTICASONE PROPIONATE 50 MCG/ACT NA SUSP: 2.0000 | Freq: Every day | NASAL | Status: DC | PRN

## 2015-04-02 MED ORDER — AMLODIPINE BESYLATE 2.5 MG PO TABS: 2.5000 mg | ORAL_TABLET | Freq: Every day | ORAL | Status: DC

## 2015-04-02 NOTE — Progress Notes (Signed)
Subjective:    Patient ID: Jacob Bishop, male    DOB: 02-Mar-1941, 74 y.o.   MRN: [id number]  HPI  74 year old white male with history of diabetes mellitus type 2 uncontrolled, essential hypertension and hyperlipidemia for routine follow-up.  Patient denies any significant interval medical history.  Type 2 diabetes-He has not always used Victoza due to excessive cost. Patient also reports not taking Starlix on a regular basis.  His morning blood sugars are frequently greater than 200. He denies any hypoglycemia.  Review of Systems Negative for chest pain    Past Medical History  Diagnosis Date  . Hypertension   . Diabetes mellitus     type II  . CAD in native artery   . Hyperlipidemia   . Edema   . Obesity   . OSA (obstructive sleep apnea)   . Cellulitis   . Dermatitis   . Rhinosinusitis   . Skin lesion   . Positional vertigo   . Hx of colonic polyps   . History of kidney stones     History   Social History  . Marital Status: Married    Spouse Name: N/A  . Number of Children: N/A  . Years of Education: N/A   Occupational History  . OWNER, restaurant    Social History Main Topics  . Smoking status: Former Smoker    Quit date: 01/10/1996  . Smokeless tobacco: Never Used  . Alcohol Use: No  . Drug Use: No  . Sexual Activity: Not on file   Other Topics Concern  . Not on file   Social History Narrative   Grew up in [place]. Mother was [place], Father [place].    Past Surgical History  Procedure Laterality Date  . Cardiac catheterization    . Coronary stent placement      3 stents /1997  . Colonoscopy    . Polypectomy      Family History  Problem Relation Age of Onset  . Cancer Mother   . Heart disease Father   . Heart disease Sister   . Diabetes Sister   . Colon cancer Sister     Allergies  Allergen Reactions  . Lipitor [Atorvastatin Calcium] Other (See Comments)    Muscle soreness    Current Outpatient Prescriptions on File Prior  to Visit  Medication Sig Dispense Refill  . aspirin 81 MG tablet Take 81 mg by mouth daily.      . diclofenac sodium (VOLTAREN) 1 % GEL Apply 2 g topically 4 (four) times daily. 2 Tube 2  . Insulin Glargine (TOUJEO SOLOSTAR) 300 UNIT/ML SOPN Inject 35 Units into the skin daily. 2 pen 0  . Insulin Pen Needle 32G X 4 MM MISC 2 each by Does not apply route daily. 200 each 3  . LANTUS SOLOSTAR 100 UNIT/ML Solostar Pen INJECT 30 TO 35 UNITS AT 10PM AS DIRECTED 15 pen 1  . Liraglutide (VICTOZA) 18 MG/3ML SOPN INJECT 0.2MLS INTO THE SKIN EVERY DAY AS DIRECTED 2 pen 0  . TOVIAZ 8 MG TB24 tablet Take 1 tablet by mouth daily.    Marland Kitchen VICTOZA 18 MG/3ML SOPN INJECT 0.2MLS INTO THE SKIN EVERY DAY AS DIRECTED 9 pen 1   No current facility-administered medications on file prior to visit.    BP 126/66 mmHg  Temp(Src) 97.9 F (36.6 C) (Oral)  Ht 5\' 11"  (1.803 m)  Wt 296 lb 1.6 oz (134.31 kg)  BMI 41.32 kg/m2      Objective:  Physical Exam  Constitutional: He is oriented to person, place, and time. He appears well-developed and well-nourished.  Cardiovascular: Normal rate, regular rhythm and normal heart sounds.   No murmur heard. Pulmonary/Chest: Effort normal and breath sounds normal. He has no wheezes.  Musculoskeletal:  Trace lower extremity edema bilaterally  Neurological: He is alert and oriented to person, place, and time. No cranial nerve deficit.  Skin: Skin is warm and dry.  Psychiatric: He has a normal mood and affect. His behavior is normal.        Assessment & Plan:   1. Diabetes Mellitus Type 2 Uncontrolled 2. Hypertension 3. Hyperlipidemia  Samples of Toujeo and Victoza provided.  Patient encouraged to check his blood sugars more often.  OneTouch Verio glucometer provided.  Patient also advised to take Starlix regularly before noon and evening meal.  BP stable.  Continue amlodipine and simvastatin.

## 2015-04-02 NOTE — Patient Instructions (Signed)
Please complete the following lab tests before your next follow up appointment: BMET, A1c, microalb / cr ratio - 250.02 FLP, LFTs, TSH - 272.4

## 2015-04-02 NOTE — Progress Notes (Signed)
Pre visit review using our clinic review tool, if applicable. No additional management support is needed unless otherwise documented below in the visit note. 

## 2015-04-04 DIAGNOSIS — M1711 Unilateral primary osteoarthritis, right knee: Secondary | ICD-10-CM | POA: Diagnosis not present

## 2015-04-04 DIAGNOSIS — M25561 Pain in right knee: Secondary | ICD-10-CM | POA: Diagnosis not present

## 2015-04-05 DIAGNOSIS — N3941 Urge incontinence: Secondary | ICD-10-CM | POA: Diagnosis not present

## 2015-04-06 DIAGNOSIS — M1712 Unilateral primary osteoarthritis, left knee: Secondary | ICD-10-CM | POA: Diagnosis not present

## 2015-04-06 DIAGNOSIS — M25561 Pain in right knee: Secondary | ICD-10-CM | POA: Diagnosis not present

## 2015-04-06 DIAGNOSIS — M170 Bilateral primary osteoarthritis of knee: Secondary | ICD-10-CM | POA: Diagnosis not present

## 2015-04-06 DIAGNOSIS — R2689 Other abnormalities of gait and mobility: Secondary | ICD-10-CM | POA: Diagnosis not present

## 2015-04-06 DIAGNOSIS — M25562 Pain in left knee: Secondary | ICD-10-CM | POA: Diagnosis not present

## 2015-04-08 ENCOUNTER — Other Ambulatory Visit: Payer: Self-pay | Admitting: Internal Medicine

## 2015-04-11 DIAGNOSIS — M25562 Pain in left knee: Secondary | ICD-10-CM | POA: Diagnosis not present

## 2015-04-11 DIAGNOSIS — M1711 Unilateral primary osteoarthritis, right knee: Secondary | ICD-10-CM | POA: Diagnosis not present

## 2015-04-11 DIAGNOSIS — R2689 Other abnormalities of gait and mobility: Secondary | ICD-10-CM | POA: Diagnosis not present

## 2015-04-11 DIAGNOSIS — M25561 Pain in right knee: Secondary | ICD-10-CM | POA: Diagnosis not present

## 2015-04-11 DIAGNOSIS — M170 Bilateral primary osteoarthritis of knee: Secondary | ICD-10-CM | POA: Diagnosis not present

## 2015-04-13 DIAGNOSIS — R2689 Other abnormalities of gait and mobility: Secondary | ICD-10-CM | POA: Diagnosis not present

## 2015-04-13 DIAGNOSIS — M1711 Unilateral primary osteoarthritis, right knee: Secondary | ICD-10-CM | POA: Diagnosis not present

## 2015-04-13 DIAGNOSIS — M1712 Unilateral primary osteoarthritis, left knee: Secondary | ICD-10-CM | POA: Diagnosis not present

## 2015-04-13 DIAGNOSIS — M25561 Pain in right knee: Secondary | ICD-10-CM | POA: Diagnosis not present

## 2015-04-13 DIAGNOSIS — M25562 Pain in left knee: Secondary | ICD-10-CM | POA: Diagnosis not present

## 2015-04-18 DIAGNOSIS — M25561 Pain in right knee: Secondary | ICD-10-CM | POA: Diagnosis not present

## 2015-04-18 DIAGNOSIS — R2689 Other abnormalities of gait and mobility: Secondary | ICD-10-CM | POA: Diagnosis not present

## 2015-04-18 DIAGNOSIS — M170 Bilateral primary osteoarthritis of knee: Secondary | ICD-10-CM | POA: Diagnosis not present

## 2015-04-18 DIAGNOSIS — M25562 Pain in left knee: Secondary | ICD-10-CM | POA: Diagnosis not present

## 2015-04-18 DIAGNOSIS — M1711 Unilateral primary osteoarthritis, right knee: Secondary | ICD-10-CM | POA: Diagnosis not present

## 2015-04-20 DIAGNOSIS — R2689 Other abnormalities of gait and mobility: Secondary | ICD-10-CM | POA: Diagnosis not present

## 2015-04-20 DIAGNOSIS — M25562 Pain in left knee: Secondary | ICD-10-CM | POA: Diagnosis not present

## 2015-04-20 DIAGNOSIS — M25561 Pain in right knee: Secondary | ICD-10-CM | POA: Diagnosis not present

## 2015-04-20 DIAGNOSIS — M1712 Unilateral primary osteoarthritis, left knee: Secondary | ICD-10-CM | POA: Diagnosis not present

## 2015-04-20 DIAGNOSIS — M170 Bilateral primary osteoarthritis of knee: Secondary | ICD-10-CM | POA: Diagnosis not present

## 2015-04-24 ENCOUNTER — Other Ambulatory Visit: Payer: Self-pay | Admitting: Internal Medicine

## 2015-04-24 DIAGNOSIS — N3941 Urge incontinence: Secondary | ICD-10-CM | POA: Diagnosis not present

## 2015-05-15 DIAGNOSIS — N3941 Urge incontinence: Secondary | ICD-10-CM | POA: Diagnosis not present

## 2015-06-13 DIAGNOSIS — N3941 Urge incontinence: Secondary | ICD-10-CM | POA: Diagnosis not present

## 2015-06-16 ENCOUNTER — Other Ambulatory Visit: Payer: Self-pay | Admitting: Internal Medicine

## 2015-06-18 NOTE — Telephone Encounter (Signed)
Patient called in again for a refill of his [place] Solostar Pen.

## 2015-06-19 DIAGNOSIS — R269 Unspecified abnormalities of gait and mobility: Secondary | ICD-10-CM | POA: Diagnosis not present

## 2015-06-19 DIAGNOSIS — M25561 Pain in right knee: Secondary | ICD-10-CM | POA: Diagnosis not present

## 2015-06-19 DIAGNOSIS — M25562 Pain in left knee: Secondary | ICD-10-CM | POA: Diagnosis not present

## 2015-06-19 DIAGNOSIS — M170 Bilateral primary osteoarthritis of knee: Secondary | ICD-10-CM | POA: Diagnosis not present

## 2015-07-03 DIAGNOSIS — N3941 Urge incontinence: Secondary | ICD-10-CM | POA: Diagnosis not present

## 2015-07-04 ENCOUNTER — Ambulatory Visit: Payer: Medicare Other | Admitting: Internal Medicine

## 2015-07-16 DIAGNOSIS — N3941 Urge incontinence: Secondary | ICD-10-CM | POA: Diagnosis not present

## 2015-07-16 DIAGNOSIS — N200 Calculus of kidney: Secondary | ICD-10-CM | POA: Diagnosis not present

## 2015-07-16 DIAGNOSIS — N401 Enlarged prostate with lower urinary tract symptoms: Secondary | ICD-10-CM | POA: Diagnosis not present

## 2015-07-16 DIAGNOSIS — N3020 Other chronic cystitis without hematuria: Secondary | ICD-10-CM | POA: Diagnosis not present

## 2015-07-16 DIAGNOSIS — N138 Other obstructive and reflux uropathy: Secondary | ICD-10-CM | POA: Diagnosis not present

## 2015-07-24 DIAGNOSIS — N3941 Urge incontinence: Secondary | ICD-10-CM | POA: Diagnosis not present

## 2015-07-25 DIAGNOSIS — M170 Bilateral primary osteoarthritis of knee: Secondary | ICD-10-CM | POA: Diagnosis not present

## 2015-07-25 DIAGNOSIS — M25561 Pain in right knee: Secondary | ICD-10-CM | POA: Diagnosis not present

## 2015-07-25 DIAGNOSIS — M25562 Pain in left knee: Secondary | ICD-10-CM | POA: Diagnosis not present

## 2015-08-01 DIAGNOSIS — M1711 Unilateral primary osteoarthritis, right knee: Secondary | ICD-10-CM | POA: Diagnosis not present

## 2015-08-01 DIAGNOSIS — M25561 Pain in right knee: Secondary | ICD-10-CM | POA: Diagnosis not present

## 2015-08-03 DIAGNOSIS — M1712 Unilateral primary osteoarthritis, left knee: Secondary | ICD-10-CM | POA: Diagnosis not present

## 2015-08-03 DIAGNOSIS — M25562 Pain in left knee: Secondary | ICD-10-CM | POA: Diagnosis not present

## 2015-08-08 DIAGNOSIS — M25561 Pain in right knee: Secondary | ICD-10-CM | POA: Diagnosis not present

## 2015-08-08 DIAGNOSIS — M1711 Unilateral primary osteoarthritis, right knee: Secondary | ICD-10-CM | POA: Diagnosis not present

## 2015-08-10 DIAGNOSIS — M25562 Pain in left knee: Secondary | ICD-10-CM | POA: Diagnosis not present

## 2015-08-10 DIAGNOSIS — M1712 Unilateral primary osteoarthritis, left knee: Secondary | ICD-10-CM | POA: Diagnosis not present

## 2015-08-14 DIAGNOSIS — N3941 Urge incontinence: Secondary | ICD-10-CM | POA: Diagnosis not present

## 2015-08-15 DIAGNOSIS — M1711 Unilateral primary osteoarthritis, right knee: Secondary | ICD-10-CM | POA: Diagnosis not present

## 2015-08-15 DIAGNOSIS — M25561 Pain in right knee: Secondary | ICD-10-CM | POA: Diagnosis not present

## 2015-08-17 DIAGNOSIS — M1712 Unilateral primary osteoarthritis, left knee: Secondary | ICD-10-CM | POA: Diagnosis not present

## 2015-08-17 DIAGNOSIS — M25562 Pain in left knee: Secondary | ICD-10-CM | POA: Diagnosis not present

## 2015-08-22 DIAGNOSIS — M1711 Unilateral primary osteoarthritis, right knee: Secondary | ICD-10-CM | POA: Diagnosis not present

## 2015-08-22 DIAGNOSIS — M25561 Pain in right knee: Secondary | ICD-10-CM | POA: Diagnosis not present

## 2015-08-23 ENCOUNTER — Emergency Department (HOSPITAL_COMMUNITY)
Admission: EM | Admit: 2015-08-23 | Discharge: 2015-08-23 | Disposition: A | Discharge status: Home / Self Care | Payer: Medicare Other | Attending: Emergency Medicine | Admitting: Emergency Medicine

## 2015-08-23 ENCOUNTER — Emergency Department (HOSPITAL_COMMUNITY): Payer: Medicare Other

## 2015-08-23 ENCOUNTER — Encounter (HOSPITAL_COMMUNITY): Payer: Self-pay | Admitting: *Deleted

## 2015-08-23 ENCOUNTER — Telehealth: Payer: Self-pay | Admitting: Internal Medicine

## 2015-08-23 DIAGNOSIS — Z794 Long term (current) use of insulin: Secondary | ICD-10-CM | POA: Insufficient documentation

## 2015-08-23 DIAGNOSIS — Z791 Long term (current) use of non-steroidal anti-inflammatories (NSAID): Secondary | ICD-10-CM | POA: Diagnosis not present

## 2015-08-23 DIAGNOSIS — S0081XA Abrasion of other part of head, initial encounter: Secondary | ICD-10-CM | POA: Diagnosis not present

## 2015-08-23 DIAGNOSIS — I10 Essential (primary) hypertension: Secondary | ICD-10-CM | POA: Insufficient documentation

## 2015-08-23 DIAGNOSIS — Z87442 Personal history of urinary calculi: Secondary | ICD-10-CM | POA: Diagnosis not present

## 2015-08-23 DIAGNOSIS — Z8669 Personal history of other diseases of the nervous system and sense organs: Secondary | ICD-10-CM | POA: Diagnosis not present

## 2015-08-23 DIAGNOSIS — W01190A Fall on same level from slipping, tripping and stumbling with subsequent striking against furniture, initial encounter: Secondary | ICD-10-CM | POA: Insufficient documentation

## 2015-08-23 DIAGNOSIS — E119 Type 2 diabetes mellitus without complications: Secondary | ICD-10-CM | POA: Insufficient documentation

## 2015-08-23 DIAGNOSIS — Z79899 Other long term (current) drug therapy: Secondary | ICD-10-CM | POA: Insufficient documentation

## 2015-08-23 DIAGNOSIS — Z87891 Personal history of nicotine dependence: Secondary | ICD-10-CM | POA: Insufficient documentation

## 2015-08-23 DIAGNOSIS — Y9289 Other specified places as the place of occurrence of the external cause: Secondary | ICD-10-CM | POA: Insufficient documentation

## 2015-08-23 DIAGNOSIS — S0993XA Unspecified injury of face, initial encounter: Secondary | ICD-10-CM | POA: Diagnosis present

## 2015-08-23 DIAGNOSIS — Z7984 Long term (current) use of oral hypoglycemic drugs: Secondary | ICD-10-CM | POA: Insufficient documentation

## 2015-08-23 DIAGNOSIS — E785 Hyperlipidemia, unspecified: Secondary | ICD-10-CM | POA: Insufficient documentation

## 2015-08-23 DIAGNOSIS — I2510 Atherosclerotic heart disease of native coronary artery without angina pectoris: Secondary | ICD-10-CM | POA: Diagnosis not present

## 2015-08-23 DIAGNOSIS — Y9389 Activity, other specified: Secondary | ICD-10-CM | POA: Insufficient documentation

## 2015-08-23 DIAGNOSIS — R220 Localized swelling, mass and lump, head: Secondary | ICD-10-CM | POA: Diagnosis not present

## 2015-08-23 DIAGNOSIS — W19XXXA Unspecified fall, initial encounter: Secondary | ICD-10-CM

## 2015-08-23 DIAGNOSIS — E669 Obesity, unspecified: Secondary | ICD-10-CM | POA: Diagnosis not present

## 2015-08-23 DIAGNOSIS — Z872 Personal history of diseases of the skin and subcutaneous tissue: Secondary | ICD-10-CM | POA: Insufficient documentation

## 2015-08-23 DIAGNOSIS — Z86010 Personal history of colonic polyps: Secondary | ICD-10-CM | POA: Insufficient documentation

## 2015-08-23 DIAGNOSIS — Z9861 Coronary angioplasty status: Secondary | ICD-10-CM | POA: Diagnosis not present

## 2015-08-23 DIAGNOSIS — S0083XA Contusion of other part of head, initial encounter: Secondary | ICD-10-CM

## 2015-08-23 DIAGNOSIS — Y998 Other external cause status: Secondary | ICD-10-CM | POA: Diagnosis not present

## 2015-08-23 DIAGNOSIS — Z9889 Other specified postprocedural states: Secondary | ICD-10-CM | POA: Insufficient documentation

## 2015-08-23 DIAGNOSIS — Z7982 Long term (current) use of aspirin: Secondary | ICD-10-CM | POA: Diagnosis not present

## 2015-08-23 NOTE — ED Provider Notes (Signed)
CSN: [id number]     Arrival date & time 08/23/15  1407 History  By signing my name below, I, Randa Evens, attest that this documentation has been prepared under the direction and in the presence of Carlisle Cater, PA-C. Electronically Signed: Randa Evens, ED Scribe. 08/23/2015. 2:47 PM.    Chief Complaint  Patient presents with  . Fall  . Facial Injury   The history is provided by the patient. No language interpreter was used.   HPI Comments: Jacob Bishop is a 74 y.o. male who presents to the Emergency Department complaining of fall onset 3 days prior. Pt presents with associated worsening periorbital bruising, facial swelling, abrasion to the right forehead and abrasion to the right hand. Pt states he slipped while pushing his garbage can up the driveway. He states that he fell face forward onto the concrete. Pt does report taking an aspirin daily but denies any other anticoagulants. Pt denies LOC. He spoke with PCP today regarding worsening bruising and was told to come to ED for evaluation.   Past Medical History  Diagnosis Date  . Hypertension   . Diabetes mellitus     type II  . CAD in native artery   . Hyperlipidemia   . Edema   . Obesity   . OSA (obstructive sleep apnea)   . Cellulitis   . Dermatitis   . Rhinosinusitis   . Skin lesion   . Positional vertigo   . Hx of colonic polyps   . History of kidney stones    Past Surgical History  Procedure Laterality Date  . Cardiac catheterization    . Coronary stent placement      3 stents /1997  . Colonoscopy    . Polypectomy     Family History  Problem Relation Age of Onset  . Cancer Mother   . Heart disease Father   . Heart disease Sister   . Diabetes Sister   . Colon cancer Sister    Social History  Substance Use Topics  . Smoking status: Former Smoker    Quit date: 01/10/1996  . Smokeless tobacco: Never Used  . Alcohol Use: No    Review of Systems  Constitutional: Negative for fatigue.  HENT:  Positive for facial swelling. Negative for tinnitus.   Eyes: Negative for photophobia, pain and visual disturbance.  Respiratory: Negative for shortness of breath.   Cardiovascular: Negative for chest pain.  Gastrointestinal: Negative for nausea and vomiting.  Musculoskeletal: Negative for back pain, gait problem and neck pain.  Skin: Positive for wound.  Neurological: Negative for dizziness, syncope, weakness, light-headedness, numbness and headaches.  Psychiatric/Behavioral: Negative for confusion and decreased concentration.      Allergies  Lipitor  Home Medications   Prior to Admission medications   Medication Sig Start Date End Date Taking? Authorizing Provider  amLODipine (NORVASC) 2.5 MG tablet Take 1 tablet (2.5 mg total) by mouth daily. 04/02/15   Doe-Hyun R Shawna Orleans, DO  amLODipine (NORVASC) 2.5 MG tablet TAKE 1 TABLET (2.5 MG TOTAL) BY MOUTH DAILY. 04/24/15   Doe-Hyun Kyra Searles, DO  aspirin 81 MG tablet Take 81 mg by mouth daily.      Historical Provider, MD  diclofenac sodium (VOLTAREN) 1 % GEL Apply 2 g topically 4 (four) times daily. 05/02/14   Doe-Hyun R Shawna Orleans, DO  fluticasone (FLONASE) 50 MCG/ACT nasal spray Place 2 sprays into both nostrils daily as needed. 04/02/15 05/11/18  Doe-Hyun R Shawna Orleans, DO  Insulin Glargine (TOUJEO SOLOSTAR) 300 UNIT/ML  SOPN Inject 35 Units into the skin daily. 12/29/14   [name], NP  Insulin Pen Needle 32G X 4 MM MISC 2 each by Does not apply route daily. 12/29/14   [name], NP  LANTUS SOLOSTAR 100 UNIT/ML Solostar Pen INJECT 30 TO 35 UNITS AT 10PM AS DIRECTED 06/18/15   Doe-Hyun R Shawna Orleans, DO  Liraglutide (VICTOZA) 18 MG/3ML SOPN INJECT 0.2MLS INTO THE SKIN EVERY DAY AS DIRECTED 12/29/14   [name], NP  nateglinide (STARLIX) 120 MG tablet TAKE 1/2 TO ONE TABLET AS DIRECTED BEFORE AM AND EVENING MEAL 04/02/15   Doe-Hyun R Shawna Orleans, DO  nateglinide (STARLIX) 120 MG tablet TAKE 1/2 TO ONE TABLET AS DIRECTED BEFORE AM AND EVENING MEAL 04/09/15   Doe-Hyun R Shawna Orleans, DO   simvastatin (ZOCOR) 20 MG tablet Take 1 tablet (20 mg total) by mouth at bedtime. 04/02/15   Doe-Hyun R Shawna Orleans, DO  tamsulosin (FLOMAX) 0.4 MG CAPS capsule Take 0.4 mg by mouth daily. 03/07/15   Historical Provider, MD  TOVIAZ 8 MG TB24 tablet Take 1 tablet by mouth daily. 11/23/13   Historical Provider, MD  VICTOZA 18 MG/3ML SOPN INJECT 0.2MLS INTO THE SKIN EVERY DAY AS DIRECTED 12/30/14   Doe-Hyun R Shawna Orleans, DO   BP 147/76 mmHg  Pulse 79  Temp(Src) 98.1 F (36.7 C) (Oral)  Resp 18  SpO2 94%   Physical Exam  Constitutional: He is oriented to person, place, and time. He appears well-developed and well-nourished.  HENT:  Head: Normocephalic. Head is without raccoon's eyes and without Battle's sign.  Right Ear: Tympanic membrane, external ear and ear canal normal. No hemotympanum.  Left Ear: Tympanic membrane, external ear and ear canal normal. No hemotympanum.  Nose: Nose normal. No nasal septal hematoma.  Mouth/Throat: Oropharynx is clear and moist.  Abrasion to the right forehead without associated cellulitis. There is ecchymosis of the forehead and inferior to the bilateral eyes. No malocclusion of jaw. Full range of motion of TMJ without pain. No tenderness over the zygoma.  Eyes: Conjunctivae, EOM and lids are normal. Pupils are equal, round, and reactive to light.  No visible hyphema  Neck: Normal range of motion. Neck supple.  Cardiovascular: Normal rate and regular rhythm.   Pulmonary/Chest: Effort normal and breath sounds normal.  Abdominal: Soft. There is no tenderness.  Musculoskeletal: Normal range of motion.       Right shoulder: Normal.       Left shoulder: Normal.       Right hip: Normal.       Left hip: Normal.       Cervical back: He exhibits normal range of motion, no tenderness and no bony tenderness.       Thoracic back: He exhibits no tenderness and no bony tenderness.       Lumbar back: He exhibits no tenderness and no bony tenderness.  Scattered, small, well-healing  abrasions to the right hand  Neurological: He is alert and oriented to person, place, and time. He has normal strength and normal reflexes. No cranial nerve deficit or sensory deficit. Coordination normal. GCS eye subscore is 4. GCS verbal subscore is 5. GCS motor subscore is 6.  Skin: Skin is warm and dry.  Psychiatric: He has a normal mood and affect.  Nursing note and vitals reviewed.   ED Course  Procedures (including critical care time) DIAGNOSTIC STUDIES: Oxygen Saturation is 94% on RA, adequate by my interpretation.    COORDINATION OF CARE: 3:00 PM-Discussed treatment plan with pt  at bedside and pt agreed to plan.     Labs Review Labs Reviewed - No data to display  Imaging Review Ct Head Wo Contrast  08/23/2015  CLINICAL DATA:  Fall 3 days ago with worsening periorbital bruising and facial swelling. Abrasion right forehead and right hand. EXAM: CT HEAD WITHOUT CONTRAST CT MAXILLOFACIAL WITHOUT CONTRAST TECHNIQUE: Multidetector CT imaging of the head and maxillofacial structures were performed using the standard protocol without intravenous contrast. Multiplanar CT image reconstructions of the maxillofacial structures were also generated. COMPARISON:  None. FINDINGS: CT HEAD FINDINGS Ventricles, cisterns and other CSF spaces are within normal. There is no mass, mass effect, shift of midline structures or acute hemorrhage. There is minimal chronic ischemic microvascular disease. No evidence of acute infarction. Remaining bones and soft tissues are within normal. CT MAXILLOFACIAL FINDINGS Orbits are normal symmetric. Paranasal sinuses are well aerated. Mild deviation of the nasal septum to the right. No evidence of acute fracture. No significant soft tissue injury. Remainder of the exam is within normal. IMPRESSION: No acute intracranial findings. Minimal chronic ischemic microvascular disease. No acute facial bone fracture. Electronically Signed   By: Marin Olp M.D.   On: 08/23/2015  15:39   Ct Maxillofacial Wo Cm  08/23/2015  CLINICAL DATA:  Fall 3 days ago with worsening periorbital bruising and facial swelling. Abrasion right forehead and right hand. EXAM: CT HEAD WITHOUT CONTRAST CT MAXILLOFACIAL WITHOUT CONTRAST TECHNIQUE: Multidetector CT imaging of the head and maxillofacial structures were performed using the standard protocol without intravenous contrast. Multiplanar CT image reconstructions of the maxillofacial structures were also generated. COMPARISON:  None. FINDINGS: CT HEAD FINDINGS Ventricles, cisterns and other CSF spaces are within normal. There is no mass, mass effect, shift of midline structures or acute hemorrhage. There is minimal chronic ischemic microvascular disease. No evidence of acute infarction. Remaining bones and soft tissues are within normal. CT MAXILLOFACIAL FINDINGS Orbits are normal symmetric. Paranasal sinuses are well aerated. Mild deviation of the nasal septum to the right. No evidence of acute fracture. No significant soft tissue injury. Remainder of the exam is within normal. IMPRESSION: No acute intracranial findings. Minimal chronic ischemic microvascular disease. No acute facial bone fracture. Electronically Signed   By: Marin Olp M.D.   On: 08/23/2015 15:39      EKG Interpretation None      Patient seen and examined. Work-up initiated.   Vital signs reviewed and are as follows: BP 147/76 mmHg  Pulse 79  Temp(Src) 98.1 F (36.7 C) (Oral)  Resp 18  SpO2 94%   Patient and wife updated on imaging results. Counseled on wound care for forehead abrasion, signs and symptoms of infection and when to return. Encouraged PCP follow-up as needed. Return to the emergency department with worsening or changing symptoms.   MDM   Final diagnoses:  Traumatic ecchymosis of face, initial encounter  Fall, initial encounter   Patient with facial abrasions and ecchymosis after falling. Imaging is negative. No neck injury suspected. No  chest or abdomen injury suspected.  I personally performed the services described in this documentation, which was scribed in my presence. The recorded information has been reviewed and is accurate.      Carlisle Cater, PA-C 08/23/15 [place], MD 08/23/15 [id number]

## 2015-08-23 NOTE — Discharge Instructions (Signed)
Please read and follow all provided instructions.  Your diagnoses today include:  1. Traumatic ecchymosis of face, initial encounter   2. Fall, initial encounter     Tests performed today include:  CT scan of your head and face that did not show any serious injury.  Vital signs. See below for your results today.   Medications prescribed:   None  Take any prescribed medications only as directed.  Home care instructions:  Follow any educational materials contained in this packet.  BE VERY CAREFUL not to take multiple medicines containing Tylenol (also called acetaminophen). Doing so can lead to an overdose which can damage your liver and cause liver failure and possibly death.   Follow-up instructions: Please follow-up with your primary care provider in the next 3 days for further evaluation of your symptoms.   Return instructions:  SEEK IMMEDIATE MEDICAL ATTENTION IF:  There is confusion or drowsiness (although children frequently become drowsy after injury).   You cannot awaken the injured person.   You have more than one episode of vomiting.   You notice dizziness or unsteadiness which is getting worse, or inability to walk.   You have convulsions or unconsciousness.   You experience severe, persistent headaches not relieved by Tylenol.  You cannot use arms or legs normally.   There are changes in pupil sizes. (This is the black center in the colored part of the eye)   There is clear or bloody discharge from the nose or ears.   You have change in speech, vision, swallowing, or understanding.   Localized weakness, numbness, tingling, or change in bowel or bladder control.  You have any other emergent concerns.  Additional Information: You have had a head injury which does not appear to require admission at this time.  Your vital signs today were: BP 147/76 mmHg   Pulse 79   Temp(Src) 98.1 F (36.7 C) (Oral)   Resp 18   SpO2 94% If your blood pressure (BP)  was elevated above 135/85 this visit, please have this repeated by your doctor within one month. --------------

## 2015-08-23 NOTE — Telephone Encounter (Signed)
Patient Name: Jacob Bishop DOB: [birth year] Initial Comment Caller states fell on Monday, both eyes turned black, fell on his face Nurse Assessment Nurse: Ronnald Ramp, RN, Miranda Date/Time (Eastern Time): 08/23/2015 8:39:36 AM Confirm and document reason for call. If symptomatic, describe symptoms. ---Caller states Monday he fell face first on the concrete. He has bruising around his eyes. He also has a scrape on the right side of his forehead. Has the patient traveled out of the country within the last 30 days? ---Not Applicable Does the patient have any new or worsening symptoms? ---Yes Will a triage be completed? ---Yes Related visit to physician within the last 2 weeks? ---No Does the PT have any chronic conditions? (i.e. diabetes, asthma, etc.) ---Yes List chronic conditions. ---Diabetes, Heart Is this a behavioral health or substance abuse call? ---No Guidelines Guideline Title Affirmed Question Affirmed Notes Head Injury One or two "black eyes" (bruising, purple color of eyelids) Final Disposition User Go to ED Now Ronnald Ramp, RN, Miranda Referrals GO TO FACILITY UNDECIDED Disagree/Comply: Comply

## 2015-08-23 NOTE — Telephone Encounter (Signed)
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  Pt states he is going to [facility] now.

## 2015-08-23 NOTE — Telephone Encounter (Signed)
Noted  

## 2015-08-23 NOTE — ED Notes (Addendum)
Pt states he fell Monday while pushing his garbage can up his driveway, which slipped and caused him to fall. Pt states he fell on his face onto concrete.. Pt states he is concerned about bruising underneath his right eye since the injury. Pt states he takes baby aspirin, no other blood thinners.

## 2015-08-24 DIAGNOSIS — M1712 Unilateral primary osteoarthritis, left knee: Secondary | ICD-10-CM | POA: Diagnosis not present

## 2015-08-24 DIAGNOSIS — M25562 Pain in left knee: Secondary | ICD-10-CM | POA: Diagnosis not present

## 2015-08-28 DIAGNOSIS — N401 Enlarged prostate with lower urinary tract symptoms: Secondary | ICD-10-CM | POA: Diagnosis not present

## 2015-09-04 DIAGNOSIS — N3941 Urge incontinence: Secondary | ICD-10-CM | POA: Diagnosis not present

## 2015-09-05 ENCOUNTER — Encounter: Payer: Self-pay | Admitting: Internal Medicine

## 2015-09-05 ENCOUNTER — Ambulatory Visit (INDEPENDENT_AMBULATORY_CARE_PROVIDER_SITE_OTHER): Payer: Medicare Other | Admitting: Internal Medicine

## 2015-09-05 VITALS — BP 120/70 | HR 85 | Temp 98.0°F | Wt 293.0 lb

## 2015-09-05 DIAGNOSIS — E1165 Type 2 diabetes mellitus with hyperglycemia: Secondary | ICD-10-CM

## 2015-09-05 DIAGNOSIS — E785 Hyperlipidemia, unspecified: Secondary | ICD-10-CM | POA: Diagnosis not present

## 2015-09-05 DIAGNOSIS — R2681 Unsteadiness on feet: Secondary | ICD-10-CM

## 2015-09-05 DIAGNOSIS — Z794 Long term (current) use of insulin: Secondary | ICD-10-CM

## 2015-09-05 DIAGNOSIS — IMO0002 Reserved for concepts with insufficient information to code with codable children: Secondary | ICD-10-CM

## 2015-09-05 DIAGNOSIS — I10 Essential (primary) hypertension: Secondary | ICD-10-CM | POA: Diagnosis not present

## 2015-09-05 DIAGNOSIS — E11620 Type 2 diabetes mellitus with diabetic dermatitis: Secondary | ICD-10-CM

## 2015-09-05 NOTE — Progress Notes (Signed)
Pre visit review using our clinic review tool, if applicable. No additional management support is needed unless otherwise documented below in the visit note. 

## 2015-09-05 NOTE — Progress Notes (Signed)
Subjective:    Patient ID: Jacob Bishop, male    DOB: Apr 06, 1941, 75 y.o.   MRN: [id number]  HPI  75 year old male with DM II, hyperlipidemia and obesity for follow up.  He was seen in ER in December 2016 after suffering fall resulting in periorbital contrusion / bruising.  He reports he lost his footing while pushing garbage can.  He has hx of bilateral knee arthritis.  He is not sure whether this was contributing factor in his fall.    He does not have any diabetic neuropathy.  He denies visual changes affected his fall.  DM II - stable.  He is due for diabetic labs  He reports suffering from mild URI in the past 1-2 weeks.  He experiences intermittent vertigo.  He has hx of BPV.  Review of Systems Negative for chest pain or shortness of breath    Past Medical History  Diagnosis Date  . Hypertension   . Diabetes mellitus     type II  . CAD in native artery   . Hyperlipidemia   . Edema   . Obesity   . OSA (obstructive sleep apnea)   . Cellulitis   . Dermatitis   . Rhinosinusitis   . Skin lesion   . Positional vertigo   . Hx of colonic polyps   . History of kidney stones     Social History   Social History  . Marital Status: Married    Spouse Name: N/A  . Number of Children: N/A  . Years of Education: N/A   Occupational History  . OWNER, restaurant    Social History Main Topics  . Smoking status: Former Smoker    Quit date: 01/10/1996  . Smokeless tobacco: Never Used  . Alcohol Use: No  . Drug Use: No  . Sexual Activity: Not on file   Other Topics Concern  . Not on file   Social History Narrative   Grew up in [place]. Mother was [place], Father [place].    Past Surgical History  Procedure Laterality Date  . Cardiac catheterization    . Coronary stent placement      3 stents /1997  . Colonoscopy    . Polypectomy      Family History  Problem Relation Age of Onset  . Cancer Mother   . Heart disease Father   . Heart disease Sister   .  Diabetes Sister   . Colon cancer Sister     Allergies  Allergen Reactions  . Lipitor [Atorvastatin Calcium] Other (See Comments)    Muscle soreness    Current Outpatient Prescriptions on File Prior to Visit  Medication Sig Dispense Refill  . fluticasone (FLONASE) 50 MCG/ACT nasal spray Place 2 sprays into both nostrils daily as needed. 16 g 3  . Insulin Pen Needle 32G X 4 MM MISC 2 each by Does not apply route daily. 200 each 3  . LANTUS SOLOSTAR 100 UNIT/ML Solostar Pen INJECT 30 TO 35 UNITS AT 10PM AS DIRECTED 15 pen 3  . Liraglutide (VICTOZA) 18 MG/3ML SOPN INJECT 0.2MLS INTO THE SKIN EVERY DAY AS DIRECTED 2 pen 0  . nateglinide (STARLIX) 120 MG tablet TAKE 1/2 TO ONE TABLET AS DIRECTED BEFORE AM AND EVENING MEAL 180 tablet 0  . simvastatin (ZOCOR) 20 MG tablet Take 1 tablet (20 mg total) by mouth at bedtime. 90 tablet 1  . tamsulosin (FLOMAX) 0.4 MG CAPS capsule Take 0.4 mg by mouth daily.  11  .  TOVIAZ 8 MG TB24 tablet Take 1 tablet by mouth daily.    Marland Kitchen amLODipine (NORVASC) 2.5 MG tablet TAKE 1 TABLET (2.5 MG TOTAL) BY MOUTH DAILY. 90 tablet 1  . aspirin 81 MG tablet Take 81 mg by mouth daily.      . Insulin Glargine (TOUJEO SOLOSTAR) 300 UNIT/ML SOPN Inject 35 Units into the skin daily. 2 pen 0   No current facility-administered medications on file prior to visit.    BP 120/70 mmHg  Pulse 85  Temp(Src) 98 F (36.7 C) (Oral)  Wt 293 lb (132.904 kg)    Objective:   Physical Exam  Constitutional: He is oriented to person, place, and time. He appears well-developed and well-nourished.  HENT:  Head: Normocephalic.  Right Ear: External ear normal.  Left Ear: External ear normal.  Mouth/Throat: Oropharynx is clear and moist.  Healed area over right eye brow,  Slight residual bruising below right orbit  Neck: Neck supple.  Cardiovascular: Normal rate, regular rhythm and normal heart sounds.   No murmur heard. Pulmonary/Chest: Effort normal and breath sounds normal. He has  no wheezes.  Musculoskeletal:  Bilateral +1 lower extremity edema   Neurological: He is alert and oriented to person, place, and time. No cranial nerve deficit.  Difficulty standing from seated position  Psychiatric: He has a normal mood and affect. His behavior is normal.        Assessment & Plan:   1.  Fall and gait instability 2.  Hypertension 3.  Diabetes mellitus type 2 uncontrolled. 4.  Hyperlipidemia 5.  Intermittent positional vertigo  Refer to PT for strength training for his lower ext / hip flexors.  Monitor BMET, A1c.  Lipid panel and LFTs  Patient advised to perform Nestor Lewandowsky exercises.  If persistent positional vertigo, we discussed referral to vestibular rehab.

## 2015-09-06 LAB — LIPID PANEL
CHOLESTEROL: 186 mg/dL (ref 0–200)
HDL: 35.3 mg/dL — ABNORMAL LOW (ref >39.00)
Total CHOL/HDL Ratio: 5
Triglycerides: 452 mg/dL — ABNORMAL HIGH (ref 0.0–149.0)

## 2015-09-06 LAB — BASIC METABOLIC PANEL
BUN: 18 mg/dL (ref 6–23)
CHLORIDE: 103 meq/L (ref 96–112)
CO2: 29 mEq/L (ref 19–32)
Calcium: 9.8 mg/dL (ref 8.4–10.5)
Creatinine, Ser: 1.02 mg/dL (ref 0.40–1.50)
GFR: 76.28 mL/min (ref >60.00)
Glucose, Bld: 220 mg/dL — ABNORMAL HIGH (ref 70–99)
Potassium: 4.3 mEq/L (ref 3.5–5.1)
Sodium: 140 mEq/L (ref 135–145)

## 2015-09-06 LAB — HEPATIC FUNCTION PANEL
ALT: 56 U/L — AB (ref 0–53)
AST: 33 U/L (ref 0–37)
Albumin: 4.5 g/dL (ref 3.5–5.2)
Alkaline Phosphatase: 71 U/L (ref 39–117)
BILIRUBIN DIRECT: 0.1 mg/dL (ref 0.0–0.3)
BILIRUBIN TOTAL: 0.7 mg/dL (ref 0.2–1.2)
TOTAL PROTEIN: 7.4 g/dL (ref 6.0–8.3)

## 2015-09-06 LAB — MICROALBUMIN / CREATININE URINE RATIO
Creatinine,U: 119 mg/dL
Microalb Creat Ratio: 3.4 mg/g (ref 0.0–30.0)
Microalb, Ur: 4 mg/dL — ABNORMAL HIGH (ref 0.0–1.9)

## 2015-09-06 LAB — HEMOGLOBIN A1C: HEMOGLOBIN A1C: 8.7 % — AB (ref 4.6–6.5)

## 2015-09-06 LAB — LDL CHOLESTEROL, DIRECT: Direct LDL: 86 mg/dL

## 2015-09-18 ENCOUNTER — Ambulatory Visit: Payer: Medicare Other | Attending: Internal Medicine

## 2015-09-18 DIAGNOSIS — R269 Unspecified abnormalities of gait and mobility: Secondary | ICD-10-CM

## 2015-09-18 DIAGNOSIS — R29898 Other symptoms and signs involving the musculoskeletal system: Secondary | ICD-10-CM | POA: Diagnosis not present

## 2015-09-18 DIAGNOSIS — R262 Difficulty in walking, not elsewhere classified: Secondary | ICD-10-CM | POA: Diagnosis not present

## 2015-09-18 NOTE — Patient Instructions (Signed)
KNEE: Extension, Long Arc Quad (Weight)  Place weight around leg. Raise leg until knee is straight. Hold _5__ seconds. Use ___ lb weight. _10__ reps per set (each leg), 4-5__ sets per day, __7_ days per week  Knee Raise   Lift knee and then lower it. Repeat with other knee. Repeat _10__ times each leg. Do _4-5___ sessions per day.  http://gt2.exer.us/445   Copyright  VHI. All rights reserved.   HIP: Hamstrings - Short Sitting    Rest leg on raised surface. Keep knee straight. Lift chest. Hold ___ seconds. ___ reps per set, ___ sets per day, ___ days per week  Copyright  VHI. All rights reserved.   [place] [address], [place],  [address] Phone # [phone] Fax [phone]

## 2015-09-18 NOTE — Therapy (Signed)
[facility] [address], [place], [place], [address] Phone: [phone]   Fax:  [phone]  Physical Therapy Evaluation  Patient Details  Name: Jacob Bishop MRN: [id number] Date of Birth: 1941/03/11 Referring Provider: Alyson Ingles, MD  Encounter Date: 09/18/2015      PT End of Session - 09/18/15 1615    Visit Number 1   Number of Visits 10   Date for PT Re-Evaluation 11/13/15   PT Start Time [id number]   PT Stop Time 1612   PT Time Calculation (min) 33 min   Activity Tolerance Patient tolerated treatment well   Behavior During Therapy [facility] for tasks assessed/performed      Past Medical History  Diagnosis Date  . Hypertension   . Diabetes mellitus     type II  . CAD in native artery   . Hyperlipidemia   . Edema   . Obesity   . OSA (obstructive sleep apnea)   . Cellulitis   . Dermatitis   . Rhinosinusitis   . Skin lesion   . Positional vertigo   . Hx of colonic polyps   . History of kidney stones     Past Surgical History  Procedure Laterality Date  . Cardiac catheterization    . Coronary stent placement      3 stents /1997  . Colonoscopy    . Polypectomy      There were no vitals filed for this visit.  Visit Diagnosis:  Difficulty walking - Plan: PT plan of care cert/re-cert  Weakness of both lower extremities - Plan: PT plan of care cert/re-cert  Abnormality of gait - Plan: PT plan of care cert/re-cert      Subjective Assessment - 09/18/15 1545    Subjective Pt presents to PT with compliaints of 1 year history of gait instability, difficulty walking and LE pain.  Pt reports that he has bad knees and probably needs to have surgery.  Pt has had a series of 2 injections in his knees with synthetic material.     Limitations Walking   How long can you walk comfortably? 20-30 minutes   Patient Stated Goals improve gait, improve LE strength, improve endurance   Currently in Pain? Yes   Pain  Score 7    Pain Location Knee   Pain Orientation Right;Left   Pain Descriptors / Indicators Sore   Pain Type Chronic pain   Pain Onset More than a month ago   Pain Frequency Intermittent   Aggravating Factors  pain only when standing/walking   Pain Relieving Factors sitting down/rest            [facility] PT Assessment - 09/18/15 0001    Assessment   Medical Diagnosis Gait instability (R26.81)   Referring Provider Alyson Ingles, MD   Onset Date/Surgical Date 09/17/14   Prior Therapy 6 months ago for knee pain   Precautions   Precautions Fall   Restrictions   Weight Bearing Restrictions No   Balance Screen   Has the patient fallen in the past 6 months Yes   How many times? 1  pushing garbage can to the street and went over a curb   Has the patient had a decrease in activity level because of a fear of falling?  Yes   Is the patient reluctant to leave their home because of a fear of falling?  Yes   [place] residence   Living Arrangements Spouse/significant other  Type of [place] to enter   Entrance Stairs-Number of Steps 3   Home Layout Two level   Alternate Level Stairs-Number of Steps [place] None   Prior Function   Level of Independence Independent   Vocation Full time employment   Vocation Requirements pt owns a [place] none   Cognition   Overall Cognitive Status Within Functional Limits for tasks assessed   Posture/Postural Control   Posture/Postural Control Postural limitations   Postural Limitations Weight shift right;Weight shift left  genu varus bilaterally   ROM / Strength   AROM / PROM / Strength AROM;Strength   AROM   Overall AROM  Within functional limits for tasks performed   Overall AROM Comments Not formallly tested.  pt ambulates with reduced hip mobilty and demonstrates stiffness in bil. hips and knees with movement.     Strength   Overall Strength  Deficits   Overall Strength Comments Bil hip flexor strength 4/5, knee extensoin 4+/5, flexion 4/5, DF 4+/5   Palpation   Palpation comment diffuse palpable tenderness over bilateral knee joints, distal hamstrings and quads   Transfers   Transfers Sit to Stand   Five time sit to stand comments  22   Comments use of arms with 5x sit to stand   Ambulation/Gait   Ambulation/Gait Yes   Ambulation Distance (Feet) 100 Feet   Gait Pattern Decreased trunk rotation;Lateral trunk lean to left;Lateral trunk lean to right;Decreased step length - right;Decreased step length - left;Decreased arm swing - right;Decreased arm swing - left   Ambulation Surface Level   Balance   Balance Assessed Yes   Standardized Balance Assessment   Standardized Balance Assessment Timed Up and Go Test   Timed Up and Go Test   TUG Normal TUG   Normal TUG (seconds) 19                           PT Education - 09/18/15 1607    Education provided Yes   Education Details HEP: long arc quad, seated marching, hamstring stretch   Person(s) Educated Patient   Methods Explanation;Demonstration;Handout   Comprehension Verbalized understanding;Returned demonstration          PT Short Term Goals - 09/18/15 1620    PT SHORT TERM GOAL #1   Title be independent in initial HEP   Time 4   Period Weeks   Status New   PT SHORT TERM GOAL #2   Title report 20% reduction in bilateral LE pain with standing   Time 4   Period Weeks   Status New   PT SHORT TERM GOAL #3   Title perform TUG in < or = to 17 seconds   Time 4   Period Weeks   Status New           PT Long Term Goals - 09/18/15 1621    PT LONG TERM GOAL #1   Title be independence in advanced HEP   Time 8   Period Weeks   Status New   PT LONG TERM GOAL #2   Title perform TUG in < or = to 15 seconds to improve stability   Time 8   Period Weeks   Status New   PT LONG TERM GOAL #3   Title improve LE strength to perform 5x sit to stand  in < or = to 18 seconds (with  use of arms)   Time 8   Period Weeks   Status New   PT LONG TERM GOAL #4   Title report a 30% reduction in bil. LE knee pain with standing activities   Time 8   Period Weeks   Status New               Plan - 09/25/2015 1616    Clinical Impression Statement Pt presents to PT with complaints of LE weakness, gait abnormality and balance deficts lasting > 1 year.  Pt has significant knee pain and OA bilaterally and reports that this has worsened over the past year. Pt demonstrates significant gait abnormality, LE weakness, TUG is 19 seconds and 5x sit to stand is 22 seconds.  Pt will benefit from skilled PT for LE strength and endurance and gait/balance training to iimprove gait and safety.     Pt will benefit from skilled therapeutic intervention in order to improve on the following deficits Abnormal gait;Decreased range of motion;Difficulty walking;Pain;Decreased strength;Decreased mobility;Decreased balance;Decreased activity tolerance;Decreased endurance   Rehab Potential Good   PT Frequency 2x / week   PT Duration 8 weeks   PT Treatment/Interventions ADLs/Self Care Home Management;Cryotherapy;Electrical Stimulation;Moist Heat;Therapeutic exercise;Therapeutic activities;Functional mobility training;Stair training;Gait training;Ultrasound;Neuromuscular re-education;Patient/family education;Manual techniques;Passive range of motion   PT Next Visit Plan Gait, LE strength as tolerated, balance training   Consulted and Agree with Plan of Care Patient          G-Codes - 09/25/15 1608    Functional Assessment Tool Used TUG, 5X sit to stand   Functional Limitation Mobility: Walking and moving around   Mobility: Walking and Moving Around Current Status [phone]) At least 40 percent but less than 60 percent impaired, limited or restricted   Mobility: Walking and Moving Around Goal Status [phone]) At least 40 percent but less than 60 percent impaired, limited or  restricted       Problem List Patient Active Problem List   Diagnosis Date Noted  . Osteoarthritis, hand 05/05/2014  . Carpal tunnel syndrome 05/02/2014  . Eczema 01/23/2014  . Thrombocytopenia, unspecified ([place]) 11/19/2012  . Overactive bladder 08/24/2012  . Osteoarthritis of right knee 01/22/2012  . Benign positional vertigo 07/09/2011  . CAD, NATIVE VESSEL 10/22/2009  . EDEMA 08/03/2009  . Obstructive sleep apnea 05/03/2009  . SKIN LESION 09/23/2007  . Diabetes mellitus type 2, uncontrolled ([place]) 03/23/2007  . Hyperlipidemia 03/23/2007  . Essential hypertension 03/23/2007  . COLONIC POLYPS, HX OF 03/23/2007  . Morbid obesity ([place]) 03/23/2007    TAKACS,KELLY, PT 2015/09/25, 4:25 PM  [facility] [address] [place], [place], [address] Phone: [phone]   Fax:  [phone]  Name: Cameryn Gildea MRN: [id number] Date of Birth: 04-Aug-1941

## 2015-09-25 DIAGNOSIS — N3941 Urge incontinence: Secondary | ICD-10-CM | POA: Diagnosis not present

## 2015-09-26 ENCOUNTER — Ambulatory Visit: Payer: Medicare Other | Attending: Internal Medicine | Admitting: Physical Therapy

## 2015-09-26 ENCOUNTER — Encounter: Payer: Self-pay | Admitting: Physical Therapy

## 2015-09-26 DIAGNOSIS — R269 Unspecified abnormalities of gait and mobility: Secondary | ICD-10-CM

## 2015-09-26 DIAGNOSIS — R262 Difficulty in walking, not elsewhere classified: Secondary | ICD-10-CM | POA: Insufficient documentation

## 2015-09-26 DIAGNOSIS — R29898 Other symptoms and signs involving the musculoskeletal system: Secondary | ICD-10-CM | POA: Insufficient documentation

## 2015-09-26 NOTE — Patient Instructions (Signed)
HIP: Hamstrings - Short Sitting    Rest leg on raised surface. Keep knee straight. Lift chest. Hold 20___ seconds. __3_ reps per set, _2_x, days per day.   Copyright  VHI. All rights reserved.   #2 Sitting, feet on the floor: Move the thigh bones in & out slowly 10-20x Do this a few times a day.

## 2015-09-26 NOTE — Therapy (Signed)
[facility] [address] [place], [place], [address] Phone: [phone]   Fax:  [phone]  Physical Therapy Treatment  Patient Details  Name: Jacob Bishop MRN: [id number] Date of Birth: 1941/01/27 Referring Provider: Alyson Ingles, MD  Encounter Date: 09/26/2015      PT End of Session - 09/26/15 1559    Visit Number 2   Number of Visits 10   Date for PT Re-Evaluation 11/13/15   PT Start Time 1540  10 min late   PT Stop Time 1630   PT Time Calculation (min) 50 min   Activity Tolerance Patient limited by pain   Behavior During Therapy [facility] for tasks assessed/performed      Past Medical History  Diagnosis Date  . Hypertension   . Diabetes mellitus     type II  . CAD in native artery   . Hyperlipidemia   . Edema   . Obesity   . OSA (obstructive sleep apnea)   . Cellulitis   . Dermatitis   . Rhinosinusitis   . Skin lesion   . Positional vertigo   . Hx of colonic polyps   . History of kidney stones     Past Surgical History  Procedure Laterality Date  . Cardiac catheterization    . Coronary stent placement      3 stents /1997  . Colonoscopy    . Polypectomy      There were no vitals filed for this visit.  Visit Diagnosis:  Difficulty walking  Weakness of both lower extremities  Abnormality of gait      Subjective Assessment - 09/26/15 1541    Subjective My knees always hurt.    Currently in Pain? Yes   Pain Score 9    Pain Location Knee   Pain Orientation Right;Left   Pain Descriptors / Indicators Constant   Aggravating Factors  Constant   Pain Relieving Factors Sitting   Multiple Pain Sites No                         [facility] Adult PT Treatment/Exercise - 09/26/15 0001    Knee/Hip Exercises: Stretches   Active Hamstring Stretch Both;3 reps;20 seconds   Active Hamstring Stretch Limitations Seated sitting on blue Airex   Other Knee/Hip Stretches Lower trunk  rotation LE on red ball 20x slowly   Knee/Hip Exercises: Aerobic   Nustep 6 min L1   Knee/Hip Exercises: Seated   Long Arc Quad Strengthening;Both;2 sets;10 reps   Long Arc Quad Weight 0 lbs.   Long Arc Quad Limitations Pain   Ball Squeeze 20x   Abduction/Adduction  AROM;Both;20 reps   Knee/Hip Exercises: Supine   Knee Flexion AAROM;Both;20 reps   Knee Flexion Limitations Blue wedge for Upper, red ball at LE    Moist Heat Therapy   Number Minutes Moist Heat 15 Minutes   Moist Heat Location --  Bil knee   Electrical Stimulation   Electrical Stimulation Location Bil knees   Electrical Stimulation Action IFC   Electrical Stimulation Parameters 80-150 HZ   Electrical Stimulation Goals Pain                  PT Short Term Goals - 09/26/15 1616    PT SHORT TERM GOAL #1   Title be independent in initial HEP   Time 4   Period Weeks   Status Achieved   PT SHORT TERM GOAL #2   Title  report 20% reduction in bilateral LE pain with standing   Time 4   Period Weeks   Status On-going  Only first visit   PT SHORT TERM GOAL #3   Title perform TUG in < or = to 17 seconds   Time 4   Period Weeks   Status On-going  Just started treatment today.           PT Long Term Goals - 09/18/15 1621    PT LONG TERM GOAL #1   Title be independence in advanced HEP   Time 8   Period Weeks   Status New   PT LONG TERM GOAL #2   Title perform TUG in < or = to 15 seconds to improve stability   Time 8   Period Weeks   Status New   PT LONG TERM GOAL #3   Title improve LE strength to perform 5x sit to stand in < or = to 18 seconds (with use of arms)   Time 8   Period Weeks   Status New   PT LONG TERM GOAL #4   Title report a 30% reduction in bil. LE knee pain with standing activities   Time 8   Period Weeks   Status New               Plan - 09/26/15 1614    Clinical Impression Statement Pt limited by some significant knee pain bilaterally today. He reports he has been  up since 4:00 working. Open chain increased pain so we limited thiese kinds of exercises. We also work on hip ROM and stretching which is complinat with at home. We will do a tril of Estim to see if this gives him some relief .    Pt will benefit from skilled therapeutic intervention in order to improve on the following deficits Abnormal gait;Decreased range of motion;Difficulty walking;Pain;Decreased strength;Decreased mobility;Decreased balance;Decreased activity tolerance;Decreased endurance   Rehab Potential Good   PT Frequency 2x / week   PT Duration 8 weeks   PT Treatment/Interventions ADLs/Self Care Home Management;Cryotherapy;Electrical Stimulation;Moist Heat;Therapeutic exercise;Therapeutic activities;Functional mobility training;Stair training;Gait training;Ultrasound;Neuromuscular re-education;Patient/family education;Manual techniques;Passive range of motion   PT Next Visit Plan Gait, LE strength as tolerated, balance training, see if Estim gave any relief   Consulted and Agree with Plan of Care Patient        Problem List Patient Active Problem List   Diagnosis Date Noted  . Osteoarthritis, hand 05/05/2014  . Carpal tunnel syndrome 05/02/2014  . Eczema 01/23/2014  . Thrombocytopenia, unspecified ([place]) 11/19/2012  . Overactive bladder 08/24/2012  . Osteoarthritis of right knee 01/22/2012  . Benign positional vertigo 07/09/2011  . CAD, NATIVE VESSEL 10/22/2009  . EDEMA 08/03/2009  . Obstructive sleep apnea 05/03/2009  . SKIN LESION 09/23/2007  . Diabetes mellitus type 2, uncontrolled ([place]) 03/23/2007  . Hyperlipidemia 03/23/2007  . Essential hypertension 03/23/2007  . COLONIC POLYPS, HX OF 03/23/2007  . Morbid obesity ([place]) 03/23/2007    Jacob Bishop, PTA 09/26/2015, 4:17 PM   [facility] [address], [place], [place], [address] Phone: [phone]   Fax:  [phone]  Name: Jacob Bishop MRN:  [id number] Date of Birth: 1941-07-12

## 2015-09-27 ENCOUNTER — Encounter: Payer: Medicare Other | Admitting: Physical Therapy

## 2015-10-01 ENCOUNTER — Ambulatory Visit: Payer: Medicare Other | Admitting: Physical Therapy

## 2015-10-01 ENCOUNTER — Encounter: Payer: Self-pay | Admitting: Physical Therapy

## 2015-10-01 DIAGNOSIS — R269 Unspecified abnormalities of gait and mobility: Secondary | ICD-10-CM | POA: Diagnosis not present

## 2015-10-01 DIAGNOSIS — R29898 Other symptoms and signs involving the musculoskeletal system: Secondary | ICD-10-CM | POA: Diagnosis not present

## 2015-10-01 DIAGNOSIS — R262 Difficulty in walking, not elsewhere classified: Secondary | ICD-10-CM | POA: Diagnosis not present

## 2015-10-01 NOTE — Therapy (Signed)
[facility] [address], [place], [place], [address] Phone: [phone]   Fax:  [phone]  Physical Therapy Treatment  Patient Details  Name: Jacob Bishop MRN: [id number] Date of Birth: October 14, 1940 Referring Provider: Alyson Ingles, MD  Encounter Date: 10/01/2015      PT End of Session - 10/01/15 1537    Visit Number 3   Number of Visits 10   Date for PT Re-Evaluation 11/13/15   PT Start Time [id number]   PT Stop Time 1620   PT Time Calculation (min) 47 min   Activity Tolerance Patient tolerated treatment well   Behavior During Therapy [facility] for tasks assessed/performed      Past Medical History  Diagnosis Date  . Hypertension   . Diabetes mellitus     type II  . CAD in native artery   . Hyperlipidemia   . Edema   . Obesity   . OSA (obstructive sleep apnea)   . Cellulitis   . Dermatitis   . Rhinosinusitis   . Skin lesion   . Positional vertigo   . Hx of colonic polyps   . History of kidney stones     Past Surgical History  Procedure Laterality Date  . Cardiac catheterization    . Coronary stent placement      3 stents /1997  . Colonoscopy    . Polypectomy      There were no vitals filed for this visit.  Visit Diagnosis:  Difficulty walking  Weakness of both lower extremities  Abnormality of gait      Subjective Assessment - 10/01/15 1536    Subjective the heat and Stim really gave me relief and is still giving me relief.   Currently in Pain? No/denies   Multiple Pain Sites No                         [facility] Adult PT Treatment/Exercise - 10/01/15 0001    Knee/Hip Exercises: Aerobic   Nustep L1 x 7 min   Knee/Hip Exercises: Standing   Heel Raises Both;1 set;10 reps;1 second   Rebounder 3 way weight  shift 1 min   Knee/Hip Exercises: Seated   Long Arc Quad Strengthening;Both;2 sets;10 reps   Long Arc Quad Weight 0 lbs.   Ball Squeeze 30x   Abduction/Adduction   AROM;Both;1 set;20 reps   Moist Heat Therapy   Number Minutes Moist Heat 15 Minutes   Moist Heat Location --  Bil knee   Electrical Stimulation   Electrical Stimulation Location Bil knees   Electrical Stimulation Action IFC   Electrical Stimulation Parameters 80-150 HZ   Electrical Stimulation Goals Pain                PT Education - 10/01/15 1549    Education provided Yes   Education Details How to purchase home TENS unit, heel raises for HEP   Person(s) Educated Patient   Methods Explanation;Handout   Comprehension Verbalized understanding          PT Short Term Goals - 10/01/15 1537    PT SHORT TERM GOAL #2   Title report 20% reduction in bilateral LE pain with standing   Time 4   Period Weeks   Status Achieved  20%   PT SHORT TERM GOAL #3   Title perform TUG in < or = to 17 seconds   Time 4   Period Weeks   Status On-going  Will  do next week.           PT Long Term Goals - 09/18/15 1621    PT LONG TERM GOAL #1   Title be independence in advanced HEP   Time 8   Period Weeks   Status New   PT LONG TERM GOAL #2   Title perform TUG in < or = to 15 seconds to improve stability   Time 8   Period Weeks   Status New   PT LONG TERM GOAL #3   Title improve LE strength to perform 5x sit to stand in < or = to 18 seconds (with use of arms)   Time 8   Period Weeks   Status New   PT LONG TERM GOAL #4   Title report a 30% reduction in bil. LE knee pain with standing activities   Time 8   Period Weeks   Status New               Plan - 10/01/15 1547    Clinical Impression Statement Pt reports he got significant relief using the TENS unit and heat. Instructed pt today where & how to purchase one for home use. Added some weight bearing exercises into session today which he tolerated wihtout increased pain. He also reports his mobility getting around during the day is better/easier from doing his hip stretches.    Pt will benefit from skilled  therapeutic intervention in order to improve on the following deficits Abnormal gait;Decreased range of motion;Difficulty walking;Pain;Decreased strength;Decreased mobility;Decreased balance;Decreased activity tolerance;Decreased endurance   Rehab Potential Good   PT Frequency 2x / week   PT Duration 8 weeks   PT Treatment/Interventions ADLs/Self Care Home Management;Cryotherapy;Electrical Stimulation;Moist Heat;Therapeutic exercise;Therapeutic activities;Functional mobility training;Stair training;Gait training;Ultrasound;Neuromuscular re-education;Patient/family education;Manual techniques;Passive range of motion   PT Next Visit Plan Gait, LE strength as tolerated, balance training,  Estim     Consulted and Agree with Plan of Care Patient        Problem List Patient Active Problem List   Diagnosis Date Noted  . Osteoarthritis, hand 05/05/2014  . Carpal tunnel syndrome 05/02/2014  . Eczema 01/23/2014  . Thrombocytopenia, unspecified ([place]) 11/19/2012  . Overactive bladder 08/24/2012  . Osteoarthritis of right knee 01/22/2012  . Benign positional vertigo 07/09/2011  . CAD, NATIVE VESSEL 10/22/2009  . EDEMA 08/03/2009  . Obstructive sleep apnea 05/03/2009  . SKIN LESION 09/23/2007  . Diabetes mellitus type 2, uncontrolled ([place]) 03/23/2007  . Hyperlipidemia 03/23/2007  . Essential hypertension 03/23/2007  . COLONIC POLYPS, HX OF 03/23/2007  . Morbid obesity ([place]) 03/23/2007    Thurman Sarver, PTA 10/01/2015, 4:07 PM  [facility] [address], [place], [place], [address] Phone: [phone]   Fax:  [phone]  Name: Raif Sarafin MRN: [id number] Date of Birth: Jul 29, 1941

## 2015-10-01 NOTE — Patient Instructions (Signed)
Ankle Plantar Flexion / Dorsiflexion, Standing    Stand while holding a stable object. Rise up on toes. Then rock back on heels. Hold each position _3__ seconds. Repeat _10__ times per session. Do _2_ sessions per day.  Copyright  VHI. All rights reserved.

## 2015-10-03 ENCOUNTER — Ambulatory Visit: Payer: Medicare Other

## 2015-10-08 ENCOUNTER — Ambulatory Visit: Payer: Medicare Other | Admitting: Physical Therapy

## 2015-10-08 ENCOUNTER — Encounter: Payer: Self-pay | Admitting: Physical Therapy

## 2015-10-08 DIAGNOSIS — R262 Difficulty in walking, not elsewhere classified: Secondary | ICD-10-CM

## 2015-10-08 DIAGNOSIS — R29898 Other symptoms and signs involving the musculoskeletal system: Secondary | ICD-10-CM | POA: Diagnosis not present

## 2015-10-08 DIAGNOSIS — R269 Unspecified abnormalities of gait and mobility: Secondary | ICD-10-CM

## 2015-10-08 NOTE — Therapy (Signed)
[facility] [address] [place], [place], [address] Phone: [phone]   Fax:  [phone]  Physical Therapy Treatment  Patient Details  Name: Jacob Bishop MRN: [id number] Date of Birth: [birth year] Referring Provider: Alyson Ingles, MD  Encounter Date: 10/08/2015      PT End of Session - 10/08/15 1549    Visit Number 4   Number of Visits 10   Date for PT Re-Evaluation 11/13/15   PT Start Time 1543  13 min late   PT Stop Time 1630   PT Time Calculation (min) 47 min   Activity Tolerance Patient tolerated treatment well   Behavior During Therapy [facility] for tasks assessed/performed      Past Medical History  Diagnosis Date  . Hypertension   . Diabetes mellitus     type II  . CAD in native artery   . Hyperlipidemia   . Edema   . Obesity   . OSA (obstructive sleep apnea)   . Cellulitis   . Dermatitis   . Rhinosinusitis   . Skin lesion   . Positional vertigo   . Hx of colonic polyps   . History of kidney stones     Past Surgical History  Procedure Laterality Date  . Cardiac catheterization    . Coronary stent placement      3 stents /1997  . Colonoscopy    . Polypectomy      There were no vitals filed for this visit.  Visit Diagnosis:  Difficulty walking  Weakness of both lower extremities  Abnormality of gait      Subjective Assessment - 10/08/15 1546    Subjective My knees hurt more after the last session. My hips feel looser. Pt walks into clininc today with hips more neutral and less in external rotation   Currently in Pain? Yes   Pain Score 9    Pain Location Knee   Pain Orientation Right;Left   Pain Descriptors / Indicators Sharp;Sore   Aggravating Factors  Been constant since last visit.   Pain Relieving Factors Exercises   Multiple Pain Sites No                         [facility] Adult PT Treatment/Exercise - 10/08/15 0001    Knee/Hip Exercises: Aerobic    Nustep L1 x 8 min   Knee/Hip Exercises: Standing   Heel Raises Both;20 reps   Rebounder 3 way weight  shift 1 min  Stopped after one tandem stance due to pain increases   Knee/Hip Exercises: Seated   Long Arc Quad AROM;Strengthening;Both;1 set;5 reps   Long Arc Quad Weight 0 lbs.   Long Arc Quad Limitations Painful   Ball Squeeze 30x   Abduction/Adduction  AROM;Strengthening;Both  20 x of each   Cryotherapy   Number Minutes Cryotherapy 15 Minutes   Cryotherapy Location Knee  Bil concurrent with IFC   Type of Cryotherapy Ice pack   Electrical Stimulation   Electrical Stimulation Location Bil knees   Electrical Stimulation Action IFC   Electrical Stimulation Parameters 80-150 HZ   Electrical Stimulation Goals Pain                  PT Short Term Goals - 10/01/15 1537    PT SHORT TERM GOAL #2   Title report 20% reduction in bilateral LE pain with standing   Time 4   Period Weeks   Status Achieved  20%  PT SHORT TERM GOAL #3   Title perform TUG in < or = to 17 seconds   Time 4   Period Weeks   Status On-going  Will do next week.           PT Long Term Goals - 09/18/15 1621    PT LONG TERM GOAL #1   Title be independence in advanced HEP   Time 8   Period Weeks   Status New   PT LONG TERM GOAL #2   Title perform TUG in < or = to 15 seconds to improve stability   Time 8   Period Weeks   Status New   PT LONG TERM GOAL #3   Title improve LE strength to perform 5x sit to stand in < or = to 18 seconds (with use of arms)   Time 8   Period Weeks   Status New   PT LONG TERM GOAL #4   Title report a 30% reduction in bil. LE knee pain with standing activities   Time 8   Period Weeks   Status New               Plan - 10/08/15 1551    Clinical Impression Statement No relief with second use of Estim, attempted with ice today secondary to high complaints of pain today (9/10). He spoke of needing a knee replacement (s) today but is concerned with  missing a lot of work. Pain limits exercise. What does look improved is his ability to get his hips more into neutral versus external rotation.    Pt will benefit from skilled therapeutic intervention in order to improve on the following deficits Abnormal gait;Decreased range of motion;Difficulty walking;Pain;Decreased strength;Decreased mobility;Decreased balance;Decreased activity tolerance;Decreased endurance   Rehab Potential Good   PT Frequency 2x / week   PT Duration 8 weeks   PT Treatment/Interventions ADLs/Self Care Home Management;Cryotherapy;Electrical Stimulation;Moist Heat;Therapeutic exercise;Therapeutic activities;Functional mobility training;Stair training;Gait training;Ultrasound;Neuromuscular re-education;Patient/family education;Manual techniques;Passive range of motion   PT Next Visit Plan Balance exercises, see how ice was for pian. Do TUG in next 1-2 visits.    Consulted and Agree with Plan of Care Patient        Problem List Patient Active Problem List   Diagnosis Date Noted  . Osteoarthritis, hand 05/05/2014  . Carpal tunnel syndrome 05/02/2014  . Eczema 01/23/2014  . Thrombocytopenia, unspecified ([place]) 11/19/2012  . Overactive bladder 08/24/2012  . Osteoarthritis of right knee 01/22/2012  . Benign positional vertigo 07/09/2011  . CAD, NATIVE VESSEL 10/22/2009  . EDEMA 08/03/2009  . Obstructive sleep apnea 05/03/2009  . SKIN LESION 09/23/2007  . Diabetes mellitus type 2, uncontrolled ([place]) 03/23/2007  . Hyperlipidemia 03/23/2007  . Essential hypertension 03/23/2007  . COLONIC POLYPS, HX OF 03/23/2007  . Morbid obesity ([place]) 03/23/2007    Talasia Saulter, PTA 10/08/2015, 4:17 PM  [facility] [address], [place], [place], [address] Phone: [phone]   Fax:  [phone]  Name: Alandis Saka MRN: [id number] Date of Birth: 1941-05-25

## 2015-10-10 ENCOUNTER — Ambulatory Visit: Payer: Medicare Other | Admitting: Physical Therapy

## 2015-10-15 ENCOUNTER — Ambulatory Visit: Payer: Medicare Other | Admitting: Physical Therapy

## 2015-10-16 ENCOUNTER — Ambulatory Visit: Payer: Medicare Other

## 2015-10-16 DIAGNOSIS — N3941 Urge incontinence: Secondary | ICD-10-CM | POA: Diagnosis not present

## 2015-10-16 DIAGNOSIS — R269 Unspecified abnormalities of gait and mobility: Secondary | ICD-10-CM | POA: Diagnosis not present

## 2015-10-16 DIAGNOSIS — R29898 Other symptoms and signs involving the musculoskeletal system: Secondary | ICD-10-CM

## 2015-10-16 DIAGNOSIS — R262 Difficulty in walking, not elsewhere classified: Secondary | ICD-10-CM | POA: Diagnosis not present

## 2015-10-16 NOTE — Therapy (Signed)
[facility] [address] [place], [place], [address] Phone: [phone]   Fax:  [phone]  Physical Therapy Treatment  Patient Details  Name: Jacob Bishop MRN: [id number] Date of Birth: March 14, 1941 Referring Provider: Alyson Ingles, MD  Encounter Date: 10/16/2015      PT End of Session - 10/16/15 1011    Visit Number 5   Number of Visits 10   Date for PT Re-Evaluation 11/13/15   PT Start Time 0942  Pt late for appt   PT Stop Time 1028   PT Time Calculation (min) 46 min   Activity Tolerance Patient tolerated treatment well   Behavior During Therapy [facility] for tasks assessed/performed      Past Medical History  Diagnosis Date  . Hypertension   . Diabetes mellitus     type II  . CAD in native artery   . Hyperlipidemia   . Edema   . Obesity   . OSA (obstructive sleep apnea)   . Cellulitis   . Dermatitis   . Rhinosinusitis   . Skin lesion   . Positional vertigo   . Hx of colonic polyps   . History of kidney stones     Past Surgical History  Procedure Laterality Date  . Cardiac catheterization    . Coronary stent placement      3 stents /1997  . Colonoscopy    . Polypectomy      There were no vitals filed for this visit.  Visit Diagnosis:  Difficulty walking  Weakness of both lower extremities  Abnormality of gait      Subjective Assessment - 10/16/15 0941    Subjective I felt better after last session.  Pt reports compliance with HEP.     Currently in Pain? Yes   Pain Score 8    Pain Location Knee   Pain Orientation Right;Left   Pain Descriptors / Indicators Sore;Sharp   Pain Type Chronic pain   Pain Onset More than a month ago   Pain Frequency Intermittent   Aggravating Factors  weather, unsure of why pain increases sometimes, ascending steps   Pain Relieving Factors rest            [facility] PT Assessment - 10/16/15 0001    Standardized Balance Assessment   Standardized  Balance Assessment Timed Up and Go Test   Timed Up and Go Test   TUG Normal TUG   Normal TUG (seconds) 16                     [facility] Adult PT Treatment/Exercise - 10/16/15 0001    Knee/Hip Exercises: Aerobic   Nustep L1 x 8 min   Knee/Hip Exercises: Standing   Heel Raises Both;20 reps   Rebounder 3 way weight  shift 1 min  Stopped after one tandem stance due to pain increases   Knee/Hip Exercises: Seated   Ball Squeeze 30x   Abduction/Adduction  AROM;Strengthening;Both  20 x of each   Knee/Hip Exercises: Supine   Short Arc Quad Sets Strengthening;Both;2 sets;10 reps   Cryotherapy   Number Minutes Cryotherapy 15 Minutes   Cryotherapy Location Knee  Bil concurrent with IFC   Type of Cryotherapy Ice pack   Electrical Stimulation   Electrical Stimulation Location Bil knees   Electrical Stimulation Action IFC   Electrical Stimulation Parameters 15 minutes   Electrical Stimulation Goals Pain  PT Short Term Goals - 10/16/15 [id number]    PT SHORT TERM GOAL #1   Title be independent in initial HEP   Status Achieved   PT SHORT TERM GOAL #2   Title report 20% reduction in bilateral LE pain with standing   Status Achieved   PT SHORT TERM GOAL #3   Title perform TUG in < or = to 17 seconds   Status Achieved  16 seconds           PT Long Term Goals - 09/18/15 1621    PT LONG TERM GOAL #1   Title be independence in advanced HEP   Time 8   Period Weeks   Status New   PT LONG TERM GOAL #2   Title perform TUG in < or = to 15 seconds to improve stability   Time 8   Period Weeks   Status New   PT LONG TERM GOAL #3   Title improve LE strength to perform 5x sit to stand in < or = to 18 seconds (with use of arms)   Time 8   Period Weeks   Status New   PT LONG TERM GOAL #4   Title report a 30% reduction in bil. LE knee pain with standing activities   Time 8   Period Weeks   Status New               Plan - 10/16/15 0945     Clinical Impression Statement Pt reports consistent bil LE/knee pain due to significant OA.  Pt with 8/10 bil. knee pain reported.  Low level exercise complete today.  Pt reports that he needs a knee replacement but doesnt have the time to do it.  Pt with 20% overall improvement since the start of care.  Pt will continue to benefit from skilled PT for gentle strength, flexiblity and modalities to manage pain.     Pt will benefit from skilled therapeutic intervention in order to improve on the following deficits Abnormal gait;Decreased range of motion;Difficulty walking;Pain;Decreased strength;Decreased mobility;Decreased balance;Decreased activity tolerance;Decreased endurance   Rehab Potential Good   PT Frequency 2x / week   PT Duration 8 weeks   PT Treatment/Interventions ADLs/Self Care Home Management;Cryotherapy;Electrical Stimulation;Moist Heat;Therapeutic exercise;Therapeutic activities;Functional mobility training;Stair training;Gait training;Ultrasound;Neuromuscular re-education;Patient/family education;Manual techniques;Passive range of motion   PT Next Visit Plan LE strength, balance, modalities to control pain   Consulted and Agree with Plan of Care Patient        Problem List Patient Active Problem List   Diagnosis Date Noted  . Osteoarthritis, hand 05/05/2014  . Carpal tunnel syndrome 05/02/2014  . Eczema 01/23/2014  . Thrombocytopenia, unspecified ([place]) 11/19/2012  . Overactive bladder 08/24/2012  . Osteoarthritis of right knee 01/22/2012  . Benign positional vertigo 07/09/2011  . CAD, NATIVE VESSEL 10/22/2009  . EDEMA 08/03/2009  . Obstructive sleep apnea 05/03/2009  . SKIN LESION 09/23/2007  . Diabetes mellitus type 2, uncontrolled ([place]) 03/23/2007  . Hyperlipidemia 03/23/2007  . Essential hypertension 03/23/2007  . COLONIC POLYPS, HX OF 03/23/2007  . Morbid obesity ([place]) 03/23/2007    TAKACS,KELLY, PT 10/16/2015, 10:13 AM  [facility] [address], [place], [place], [address] Phone: [phone]   Fax:  [phone]  Name: Jacob Bishop MRN: [id number] Date of Birth: 24-Feb-1941

## 2015-10-17 ENCOUNTER — Ambulatory Visit: Payer: Medicare Other

## 2015-10-17 DIAGNOSIS — R262 Difficulty in walking, not elsewhere classified: Secondary | ICD-10-CM | POA: Diagnosis not present

## 2015-10-17 DIAGNOSIS — R29898 Other symptoms and signs involving the musculoskeletal system: Secondary | ICD-10-CM | POA: Diagnosis not present

## 2015-10-17 DIAGNOSIS — R269 Unspecified abnormalities of gait and mobility: Secondary | ICD-10-CM | POA: Diagnosis not present

## 2015-10-17 NOTE — Therapy (Signed)
[facility] [address] [place], [place], [address] Phone: [phone]   Fax:  [phone]  Physical Therapy Treatment  Patient Details  Name: Jacob Bishop MRN: [id number] Date of Birth: Dec 19, 1940 Referring Provider: Alyson Ingles, MD  Encounter Date: 10/17/2015      PT End of Session - 10/17/15 1608    Visit Number 6   Number of Visits 10   Date for PT Re-Evaluation 11/13/15   PT Start Time 1536  Pt can only tolerate 30 minutes of exercise   PT Stop Time 1623   PT Time Calculation (min) 47 min   Activity Tolerance Patient limited by pain   Behavior During Therapy [facility] for tasks assessed/performed      Past Medical History  Diagnosis Date  . Hypertension   . Diabetes mellitus     type II  . CAD in native artery   . Hyperlipidemia   . Edema   . Obesity   . OSA (obstructive sleep apnea)   . Cellulitis   . Dermatitis   . Rhinosinusitis   . Skin lesion   . Positional vertigo   . Hx of colonic polyps   . History of kidney stones     Past Surgical History  Procedure Laterality Date  . Cardiac catheterization    . Coronary stent placement      3 stents /1997  . Colonoscopy    . Polypectomy      There were no vitals filed for this visit.  Visit Diagnosis:  Difficulty walking  Weakness of both lower extremities  Abnormality of gait      Subjective Assessment - 10/16/15 0941    Subjective I felt better after last session.  Pt reports compliance with HEP.     Currently in Pain? Yes   Pain Score 8    Pain Location Knee   Pain Orientation Right;Left   Pain Descriptors / Indicators Sore;Sharp   Pain Type Chronic pain   Pain Onset More than a month ago   Pain Frequency Intermittent   Aggravating Factors  weather, unsure of why pain increases sometimes, ascending steps   Pain Relieving Factors rest                         [facility] Adult PT Treatment/Exercise - 10/17/15  0001    Knee/Hip Exercises: Aerobic   Nustep L1 x 8 min  seat 11, arms 10   Knee/Hip Exercises: Standing   Heel Raises Both;20 reps   Rebounder 3 way weight  shift 1 min   Knee/Hip Exercises: Seated   Ball Squeeze 30x   Other Seated Knee/Hip Exercises 2x10 bil each   Abduction/Adduction  AROM;Strengthening;Both  20 x of each.  Performed in supine   Knee/Hip Exercises: Supine   Short Arc Quad Sets Strengthening;Both;2 sets;10 reps   Moist Heat Therapy   Number Minutes Moist Heat 15 Minutes   Moist Heat Location Knee   Electrical Stimulation   Electrical Stimulation Location Bil knees   Electrical Stimulation Action IFC   Electrical Stimulation Parameters 15 minutes   Electrical Stimulation Goals Pain                  PT Short Term Goals - 10/16/15 [id number]    PT SHORT TERM GOAL #1   Title be independent in initial HEP   Status Achieved   PT SHORT TERM GOAL #2   Title report 20% reduction  in bilateral LE pain with standing   Status Achieved   PT SHORT TERM GOAL #3   Title perform TUG in < or = to 17 seconds   Status Achieved  16 seconds           PT Long Term Goals - 09/18/15 1621    PT LONG TERM GOAL #1   Title be independence in advanced HEP   Time 8   Period Weeks   Status New   PT LONG TERM GOAL #2   Title perform TUG in < or = to 15 seconds to improve stability   Time 8   Period Weeks   Status New   PT LONG TERM GOAL #3   Title improve LE strength to perform 5x sit to stand in < or = to 18 seconds (with use of arms)   Time 8   Period Weeks   Status New   PT LONG TERM GOAL #4   Title report a 30% reduction in bil. LE knee pain with standing activities   Time 8   Period Weeks   Status New               Plan - 10/17/15 1538    Clinical Impression Statement Pt reports LE/knee pain that is consistent due to significant OA.  Pt with 6/10 bil. knee pain today.  Continued low level exercise complete today. Pt reports that he needs a knee  replacement but doesn't have time to do it.  Pt with 20% overall improvement since the start of care.  Pt will continue to benefit from skilled PT for gentle strength, flexibility and modalities to manage pain.     Pt will benefit from skilled therapeutic intervention in order to improve on the following deficits Abnormal gait;Decreased range of motion;Difficulty walking;Pain;Decreased strength;Decreased mobility;Decreased balance;Decreased activity tolerance;Decreased endurance   Rehab Potential Good   PT Frequency 2x / week   PT Duration 8 weeks   PT Treatment/Interventions ADLs/Self Care Home Management;Cryotherapy;Electrical Stimulation;Moist Heat;Therapeutic exercise;Therapeutic activities;Functional mobility training;Stair training;Gait training;Ultrasound;Neuromuscular re-education;Patient/family education;Manual techniques;Passive range of motion   PT Next Visit Plan LE strength, balance, modalities to control pain   Consulted and Agree with Plan of Care Patient        Problem List Patient Active Problem List   Diagnosis Date Noted  . Osteoarthritis, hand 05/05/2014  . Carpal tunnel syndrome 05/02/2014  . Eczema 01/23/2014  . Thrombocytopenia, unspecified ([place]) 11/19/2012  . Overactive bladder 08/24/2012  . Osteoarthritis of right knee 01/22/2012  . Benign positional vertigo 07/09/2011  . CAD, NATIVE VESSEL 10/22/2009  . EDEMA 08/03/2009  . Obstructive sleep apnea 05/03/2009  . SKIN LESION 09/23/2007  . Diabetes mellitus type 2, uncontrolled ([place]) 03/23/2007  . Hyperlipidemia 03/23/2007  . Essential hypertension 03/23/2007  . COLONIC POLYPS, HX OF 03/23/2007  . Morbid obesity ([place]) 03/23/2007    TAKACS,KELLY, PT 10/17/2015, 4:12 PM  [facility] [address], [place], [place], [address] Phone: [phone]   Fax:  [phone]  Name: Jacob Bishop MRN: [id number] Date of Birth: [birth year]

## 2015-10-22 ENCOUNTER — Encounter: Payer: Medicare Other | Admitting: Physical Therapy

## 2015-10-23 ENCOUNTER — Ambulatory Visit: Payer: Medicare Other | Admitting: Physical Therapy

## 2015-10-23 DIAGNOSIS — R262 Difficulty in walking, not elsewhere classified: Secondary | ICD-10-CM

## 2015-10-23 DIAGNOSIS — R29898 Other symptoms and signs involving the musculoskeletal system: Secondary | ICD-10-CM | POA: Diagnosis not present

## 2015-10-23 DIAGNOSIS — R269 Unspecified abnormalities of gait and mobility: Secondary | ICD-10-CM | POA: Diagnosis not present

## 2015-10-23 NOTE — Patient Instructions (Signed)
Abduction: Clam (Eccentric) - Side-Lying   Lie on side with knees bent. Lift top knee, keeping feet together. Keep trunk steady. Slowly lower for 3-5 seconds. _10__ reps per set, _1__ sets per day, _7__ days per week.   [facility] [address] [place], [place] [address] Phone # [phone] Fax [phone]  Copyright  VHI. All rights reserved.

## 2015-10-23 NOTE — Therapy (Signed)
[facility] [address], [place], [place], [address] Phone: [phone]   Fax:  [phone]  Physical Therapy Treatment  Patient Details  Name: Jacob Bishop MRN: [id number] Date of Birth: 10-31-1940 Referring Provider: Alyson Ingles, MD  Encounter Date: 10/23/2015      PT End of Session - 10/23/15 0952    Visit Number 7   Number of Visits 10   Date for PT Re-Evaluation 11/13/15   PT Start Time 0930   PT Stop Time 1025   PT Time Calculation (min) 55 min   Activity Tolerance Patient tolerated treatment well      Past Medical History  Diagnosis Date  . Hypertension   . Diabetes mellitus     type II  . CAD in native artery   . Hyperlipidemia   . Edema   . Obesity   . OSA (obstructive sleep apnea)   . Cellulitis   . Dermatitis   . Rhinosinusitis   . Skin lesion   . Positional vertigo   . Hx of colonic polyps   . History of kidney stones     Past Surgical History  Procedure Laterality Date  . Cardiac catheterization    . Coronary stent placement      3 stents /1997  . Colonoscopy    . Polypectomy      There were no vitals filed for this visit.  Visit Diagnosis:  Difficulty walking  Weakness of both lower extremities  Abnormality of gait      Subjective Assessment - 10/23/15 0932    Subjective I think PT is helping (combo of all).  The e-stim helped a lot last time.  Muscular soreness the day after PT.  Going up steps is still difficult and hurts.     Currently in Pain? Yes   Pain Score 6    Pain Location Knee   Pain Orientation Right;Left                         [facility] Adult PT Treatment/Exercise - 10/23/15 0001    Knee/Hip Exercises: Aerobic   Nustep L1 x 8 min  seat 11, arms 10   Knee/Hip Exercises: Standing   Heel Raises Both;20 reps   Knee/Hip Exercises: Seated   Long Arc Quad Strengthening;Right;Left;1 set;10 reps   Other Seated Knee/Hip Exercises red band  hip flexion with ankle DF 10x R/L   Hamstring Curl Strengthening;Right;Left;1 set;10 reps   Hamstring Limitations red band   Knee/Hip Exercises: Sidelying   Clams 12x R/L   Moist Heat Therapy   Number Minutes Moist Heat 15 Minutes   Moist Heat Location Knee  B   Electrical Stimulation   Electrical Stimulation Location Bil knees   Electrical Stimulation Action IFC   Electrical Stimulation Parameters 15 min 31 ma   Electrical Stimulation Goals Pain                PT Education - 10/23/15 0951    Education provided Yes   Education Details clams in sidelying   Person(s) Educated Patient   Methods Explanation;Demonstration;Handout   Comprehension Verbalized understanding;Returned demonstration          PT Short Term Goals - 10/23/15 0955    PT SHORT TERM GOAL #1   Title be independent in initial HEP   Status Achieved   PT SHORT TERM GOAL #2   Title report 20% reduction in bilateral LE pain with standing  Status Achieved   PT SHORT TERM GOAL #3   Title perform TUG in < or = to 17 seconds   Status Achieved           PT Long Term Goals - 10/23/15 0955    PT LONG TERM GOAL #1   Title be independence in advanced HEP   Time 8   Period Weeks   Status On-going   PT LONG TERM GOAL #2   Title perform TUG in < or = to 15 seconds to improve stability   Time 8   Period Weeks   Status On-going   PT LONG TERM GOAL #3   Title improve LE strength to perform 5x sit to stand in < or = to 18 seconds (with use of arms)   Time 8   Period Weeks   Status On-going   PT LONG TERM GOAL #4   Title report a 30% reduction in bil. LE knee pain with standing activities   Time 8   Period Weeks   Status On-going               Plan - 10/23/15 [id number]    Clinical Impression Statement Vertigo with sit to supine and supine to sit.  Able to participate in added resistance to seated exercise without pain complaint although he fatigues quickly with standing heel and toe raises.  He  reports good pain relief from IFC e-stim  with heat (vs. cold).  Close supervision and close monitoring of pain and vertigo symptoms.     PT Next Visit Plan Seated and standing LE strength, balance; modalities for pain control as needed;  assess progress toward LTGS and continue with treatment plan.        Problem List Patient Active Problem List   Diagnosis Date Noted  . Osteoarthritis, hand 05/05/2014  . Carpal tunnel syndrome 05/02/2014  . Eczema 01/23/2014  . Thrombocytopenia, unspecified ([place]) 11/19/2012  . Overactive bladder 08/24/2012  . Osteoarthritis of right knee 01/22/2012  . Benign positional vertigo 07/09/2011  . CAD, NATIVE VESSEL 10/22/2009  . EDEMA 08/03/2009  . Obstructive sleep apnea 05/03/2009  . SKIN LESION 09/23/2007  . Diabetes mellitus type 2, uncontrolled ([place]) 03/23/2007  . Hyperlipidemia 03/23/2007  . Essential hypertension 03/23/2007  . COLONIC POLYPS, HX OF 03/23/2007  . Morbid obesity ([place]) 03/23/2007    Alvera Singh 10/23/2015, 10:10 AM  [facility] [address], [place], [place], [address] Phone: [phone]   Fax:  [phone]  Name: Jacob Bishop MRN: [id number] Date of Birth: 11-10-1940  Ruben Im, PT 10/23/2015 10:11 AM Phone: [phone] Fax: [phone]

## 2015-10-24 ENCOUNTER — Ambulatory Visit: Payer: Medicare Other

## 2015-10-25 ENCOUNTER — Ambulatory Visit: Payer: Medicare Other | Attending: Internal Medicine | Admitting: Physical Therapy

## 2015-10-25 DIAGNOSIS — R269 Unspecified abnormalities of gait and mobility: Secondary | ICD-10-CM | POA: Insufficient documentation

## 2015-10-25 DIAGNOSIS — M25561 Pain in right knee: Secondary | ICD-10-CM | POA: Insufficient documentation

## 2015-10-25 DIAGNOSIS — R29898 Other symptoms and signs involving the musculoskeletal system: Secondary | ICD-10-CM

## 2015-10-25 DIAGNOSIS — M6281 Muscle weakness (generalized): Secondary | ICD-10-CM | POA: Diagnosis not present

## 2015-10-25 DIAGNOSIS — R262 Difficulty in walking, not elsewhere classified: Secondary | ICD-10-CM

## 2015-10-25 DIAGNOSIS — M25562 Pain in left knee: Secondary | ICD-10-CM | POA: Diagnosis not present

## 2015-10-25 NOTE — Therapy (Signed)
[facility] [address] [place], [place], [address] Phone: [phone]   Fax:  [phone]  Physical Therapy Treatment  Patient Details  Name: Jacob Bishop MRN: [id number] Date of Birth: [birth year] Referring Provider: Alyson Ingles, MD  Encounter Date: 10/25/2015      PT End of Session - 10/25/15 1904    Visit Number 8   Number of Visits 10   Date for PT Re-Evaluation 11/13/15   PT Start Time [id number]   PT Stop Time 1628   PT Time Calculation (min) 50 min   Activity Tolerance Patient limited by fatigue      Past Medical History  Diagnosis Date  . Hypertension   . Diabetes mellitus     type II  . CAD in native artery   . Hyperlipidemia   . Edema   . Obesity   . OSA (obstructive sleep apnea)   . Cellulitis   . Dermatitis   . Rhinosinusitis   . Skin lesion   . Positional vertigo   . Hx of colonic polyps   . History of kidney stones     Past Surgical History  Procedure Laterality Date  . Cardiac catheterization    . Coronary stent placement      3 stents /1997  . Colonoscopy    . Polypectomy      There were no vitals filed for this visit.  Visit Diagnosis:  Difficulty walking  Weakness of both lower extremities  Abnormality of gait      Subjective Assessment - 10/25/15 1541    Subjective Patient arrives 9 min late.  I did good after last time.  It's coming along.  It's been a long day (at work.)    Currently in Pain? Yes   Pain Score 5    Pain Location Knee   Pain Orientation Right;Left   Pain Type Chronic pain   Pain Onset More than a month ago   Pain Frequency Intermittent                         [facility] Adult PT Treatment/Exercise - 10/25/15 0001    Knee/Hip Exercises: Aerobic   Nustep L1 x 8 min  seat 11, arms 10   Knee/Hip Exercises: Standing   Heel Raises Both;20 reps   Hip Abduction AROM;Right;Left;1 set;10 reps   Hip Extension AROM;Right;Left;1 set;10  reps   Other Standing Knee Exercises Alternating step taps 10x   Knee/Hip Exercises: Seated   Long Arc Quad Strengthening;Right;Left;1 set;10 reps   Long Arc Quad Limitations red band   Other Seated Knee/Hip Exercises red band hip flexion with ankle DF 10x R/L   Hamstring Curl Strengthening;Right;Left;1 set;10 reps   Hamstring Limitations red band   Sit to Sand 1 set;10 reps;without UE support  very high table    Moist Heat Therapy   Number Minutes Moist Heat 15 Minutes   Moist Heat Location Knee   Electrical Stimulation   Electrical Stimulation Location B knees   Electrical Stimulation Action IFC   Electrical Stimulation Parameters 15 min   Electrical Stimulation Goals Pain                  PT Short Term Goals - 10/25/15 1909    PT SHORT TERM GOAL #1   Title be independent in initial HEP   Status Achieved   PT SHORT TERM GOAL #2   Title report 20% reduction in bilateral LE  pain with standing   Status Achieved   PT SHORT TERM GOAL #3   Title perform TUG in < or = to 17 seconds   Status Achieved           PT Long Term Goals - 10/25/15 1909    PT LONG TERM GOAL #1   Title be independence in advanced HEP   Time 8   Period Weeks   Status On-going   PT LONG TERM GOAL #2   Title perform TUG in < or = to 15 seconds to improve stability   Time 8   Period Weeks   Status On-going   PT LONG TERM GOAL #3   Title improve LE strength to perform 5x sit to stand in < or = to 18 seconds (with use of arms)   Time 8   Period Weeks   Status On-going   PT LONG TERM GOAL #4   Title report a 30% reduction in bil. LE knee pain with standing activities   Time 8   Period Weeks   Status On-going               Plan - 10/25/15 1905    Clinical Impression Statement The patient arrives 8 min late.  He reports he is very fatigued upon arriving from working all day.  He reports he feels PT is helping and good pain relief from e-stim/heat.  He fatigues very quickly with  standing and seated therapeutic exercise.  Therapist closely monitoring response to all interventions.   Continue with treatment plan, should meet LTGs in 2-3 weeks.     PT Next Visit Plan Seated and standing LE strength, balance; modalities for pain control as need        Problem List Patient Active Problem List   Diagnosis Date Noted  . Osteoarthritis, hand 05/05/2014  . Carpal tunnel syndrome 05/02/2014  . Eczema 01/23/2014  . Thrombocytopenia, unspecified ([place]) 11/19/2012  . Overactive bladder 08/24/2012  . Osteoarthritis of right knee 01/22/2012  . Benign positional vertigo 07/09/2011  . CAD, NATIVE VESSEL 10/22/2009  . EDEMA 08/03/2009  . Obstructive sleep apnea 05/03/2009  . SKIN LESION 09/23/2007  . Diabetes mellitus type 2, uncontrolled ([place]) 03/23/2007  . Hyperlipidemia 03/23/2007  . Essential hypertension 03/23/2007  . COLONIC POLYPS, HX OF 03/23/2007  . Morbid obesity ([place]) 03/23/2007    Alvera Singh 10/25/2015, 7:11 PM  [facility] [address], [place], [place], [address] Phone: [phone]   Fax:  [phone]  Name: Jacob Bishop MRN: [id number] Date of Birth: [birth year]    Ruben Im, PT 10/25/2015 7:11 PM Phone: [phone] Fax: [phone]

## 2015-10-30 ENCOUNTER — Encounter: Payer: Self-pay | Admitting: Physical Therapy

## 2015-10-30 ENCOUNTER — Ambulatory Visit: Payer: Medicare Other | Admitting: Physical Therapy

## 2015-10-30 DIAGNOSIS — R269 Unspecified abnormalities of gait and mobility: Secondary | ICD-10-CM | POA: Diagnosis not present

## 2015-10-30 DIAGNOSIS — R29898 Other symptoms and signs involving the musculoskeletal system: Secondary | ICD-10-CM

## 2015-10-30 DIAGNOSIS — R262 Difficulty in walking, not elsewhere classified: Secondary | ICD-10-CM

## 2015-10-30 DIAGNOSIS — M25562 Pain in left knee: Secondary | ICD-10-CM | POA: Diagnosis not present

## 2015-10-30 DIAGNOSIS — M6281 Muscle weakness (generalized): Secondary | ICD-10-CM | POA: Diagnosis not present

## 2015-10-30 DIAGNOSIS — M25561 Pain in right knee: Secondary | ICD-10-CM | POA: Diagnosis not present

## 2015-10-30 NOTE — Therapy (Addendum)
[facility] [address], [place], [place], [address] Phone: [phone]   Fax:  [phone]  Physical Therapy Treatment  Patient Details  Name: Jacob Bishop MRN: [id number] Date of Birth: November 05, 1940 Referring Provider: Alyson Ingles, MD  Encounter Date: 10/30/2015    PT End of session    10/30/2015 Visit Number          9 Number of visits   16 Date of patient Re-evaluation 11/08/2015    Past Medical History  Diagnosis Date  . Hypertension   . Diabetes mellitus     type II  . CAD in native artery   . Hyperlipidemia   . Edema   . Obesity   . OSA (obstructive sleep apnea)   . Cellulitis   . Dermatitis   . Rhinosinusitis   . Skin lesion   . Positional vertigo   . Hx of colonic polyps   . History of kidney stones     Past Surgical History  Procedure Laterality Date  . Cardiac catheterization    . Coronary stent placement      3 stents /1997  . Colonoscopy    . Polypectomy      There were no vitals filed for this visit.  Visit Diagnosis:  Difficulty walking  Weakness of both lower extremities  Abnormality of gait      Subjective Assessment - 10/30/15 1145    Subjective Pt reports improvement with walking is inconsistent.   Limitations Walking   How long can you walk comfortably? 20-30 minutes   Patient Stated Goals improve gait, improve LE strength, improve endurance   Currently in Pain? Yes   Pain Score 5    Pain Location Knee   Pain Orientation Right;Left   Pain Descriptors / Indicators Sharp;Sore   Pain Type Chronic pain   Pain Onset More than a month ago   Pain Frequency Intermittent   Aggravating Factors  weather, unsure of why pain increases sometimes, ascending steps   Pain Relieving Factors rest   Multiple Pain Sites No                         [facility] Adult PT Treatment/Exercise - 10/30/15 0001    Knee/Hip Exercises: Aerobic   Nustep L1 x 10 min, seat #11,  arms # 10   Knee/Hip Exercises: Standing   Heel Raises Both;20 reps   Hip Abduction AROM;Right;Left;1 set;10 reps  2.5#   Hip Extension AROM;Right;Left;1 set;10 reps  2.5#   Rebounder 3 way weight  shift 1 min   Other Standing Knee Exercises Alternating step taps x 5min   Knee/Hip Exercises: Seated   Long Arc Quad Strengthening;Right;Left;1 set;10 reps;Weights   Long Arc Quad Weight --  2.5#   Hamstring Curl Strengthening;Right;Left;2 sets;10 reps   Hamstring Limitations red band   Moist Heat Therapy   Number Minutes Moist Heat 15 Minutes   Moist Heat Location Knee   Electrical Stimulation   Electrical Stimulation Location B knees   Electrical Stimulation Action IFC   Electrical Stimulation Parameters 15   Electrical Stimulation Goals Pain   Manual Therapy   Manual Therapy Soft tissue mobilization  to bil Hamstrings and contract relaxe x 3 each leg                  PT Short Term Goals - 10/25/15 1909    PT SHORT TERM GOAL #1   Title be independent in initial  HEP   Status Achieved   PT SHORT TERM GOAL #2   Title report 20% reduction in bilateral LE pain with standing   Status Achieved   PT SHORT TERM GOAL #3   Title perform TUG in < or = to 17 seconds   Status Achieved           PT Long Term Goals - 10/30/15 1222    PT LONG TERM GOAL #1   Title be independence in advanced HEP   Time 8   Period Weeks   Status On-going   PT LONG TERM GOAL #2   Title perform TUG in < or = to 15 seconds to improve stability   Time 8   Period Weeks   Status On-going   PT LONG TERM GOAL #3   Title improve LE strength to perform 5x sit to stand in < or = to 18 seconds (with use of arms)   Time 8   Period Weeks   Status On-going   PT LONG TERM GOAL #4   Title report a 30% reduction in bil. LE knee pain with standing activities   Time 8   Period Weeks   Status On-going               Problem List Patient Active Problem List   Diagnosis Date Noted  .  Osteoarthritis, hand 05/05/2014  . Carpal tunnel syndrome 05/02/2014  . Eczema 01/23/2014  . Thrombocytopenia, unspecified ([place]) 11/19/2012  . Overactive bladder 08/24/2012  . Osteoarthritis of right knee 01/22/2012  . Benign positional vertigo 07/09/2011  . CAD, NATIVE VESSEL 10/22/2009  . EDEMA 08/03/2009  . Obstructive sleep apnea 05/03/2009  . SKIN LESION 09/23/2007  . Diabetes mellitus type 2, uncontrolled ([place]) 03/23/2007  . Hyperlipidemia 03/23/2007  . Essential hypertension 03/23/2007  . COLONIC POLYPS, HX OF 03/23/2007  . Morbid obesity ([place]) 03/23/2007    NAUMANN-HOUEGNIFIO,Parish Dubose PTA 10/30/2015, 12:23 PM  [facility] [address], [place], [place], [address] Phone: [phone]   Fax:  [phone]  Name: Harjot Palmateer MRN: [id number] Date of Birth: 1940/11/17

## 2015-11-01 ENCOUNTER — Ambulatory Visit: Payer: Self-pay

## 2015-11-05 ENCOUNTER — Ambulatory Visit: Payer: Medicare Other | Admitting: Physical Therapy

## 2015-11-05 ENCOUNTER — Encounter: Payer: Self-pay | Admitting: Physical Therapy

## 2015-11-05 DIAGNOSIS — R262 Difficulty in walking, not elsewhere classified: Secondary | ICD-10-CM

## 2015-11-05 DIAGNOSIS — M25561 Pain in right knee: Secondary | ICD-10-CM | POA: Diagnosis not present

## 2015-11-05 DIAGNOSIS — R269 Unspecified abnormalities of gait and mobility: Secondary | ICD-10-CM | POA: Diagnosis not present

## 2015-11-05 DIAGNOSIS — M25562 Pain in left knee: Secondary | ICD-10-CM | POA: Diagnosis not present

## 2015-11-05 DIAGNOSIS — R29898 Other symptoms and signs involving the musculoskeletal system: Secondary | ICD-10-CM

## 2015-11-05 DIAGNOSIS — M6281 Muscle weakness (generalized): Secondary | ICD-10-CM | POA: Diagnosis not present

## 2015-11-05 NOTE — Therapy (Signed)
[facility] [address], [place], [place], [address] Phone: [phone]   Fax:  [phone]  Physical Therapy Treatment  Patient Details  Name: Jacob Bishop MRN: [id number] Date of Birth: 1941-04-20 Referring Provider: Alyson Ingles, MD  Encounter Date: 11/05/2015      PT End of Session - 11/05/15 1546    Visit Number 10   Number of Visits 10   Date for PT Re-Evaluation 11/13/15   PT Start Time 1536   PT Stop Time 1632   PT Time Calculation (min) 56 min   Activity Tolerance Patient limited by fatigue   Behavior During Therapy [facility] for tasks assessed/performed      Past Medical History  Diagnosis Date  . Hypertension   . Diabetes mellitus     type II  . CAD in native artery   . Hyperlipidemia   . Edema   . Obesity   . OSA (obstructive sleep apnea)   . Cellulitis   . Dermatitis   . Rhinosinusitis   . Skin lesion   . Positional vertigo   . Hx of colonic polyps   . History of kidney stones     Past Surgical History  Procedure Laterality Date  . Cardiac catheterization    . Coronary stent placement      3 stents /1997  . Colonoscopy    . Polypectomy      There were no vitals filed for this visit.  Visit Diagnosis:  Difficulty walking  Weakness of both lower extremities  Abnormality of gait      Subjective Assessment - 11/05/15 1540    Subjective Pt reports pain in bil knees is less and feels walking is improving   Limitations Walking   How long can you walk comfortably? 20-30 minutes   Patient Stated Goals improve gait, improve LE strength, improve endurance   Currently in Pain? Yes   Pain Score 4    Pain Location Knee   Pain Orientation Right;Left   Pain Descriptors / Indicators Sharp;Sore   Pain Type Chronic pain   Pain Onset More than a month ago   Pain Frequency Intermittent   Aggravating Factors  weather, ascending stairs, unsure why pain increases sometimes   Pain  Relieving Factors rest   Multiple Pain Sites No            [facility] PT Assessment - 11/05/15 0001    Standardized Balance Assessment   Standardized Balance Assessment Timed Up and Go Test   Timed Up and Go Test   TUG Normal TUG   Normal TUG (seconds) 14                     [facility] Adult PT Treatment/Exercise - 11/05/15 0001    Knee/Hip Exercises: Aerobic   Nustep L1 x 10 min, seat #11, arms # 10   Knee/Hip Exercises: Standing   Heel Raises Both;20 reps   Hip Abduction AROM;Right;Left;1 set;10 reps  2.5#   Hip Extension AROM;Right;Left;1 set;10 reps  2.5#   Rebounder 3 way weight  shift 1 min   Other Standing Knee Exercises Alternating step taps x 49min   Knee/Hip Exercises: Seated   Long Arc Quad Strengthening;Right;Left;1 set;10 reps;Weights   Long Arc Quad Weight --  2.5#   Hamstring Curl Strengthening;Right;Left;2 sets;10 reps   Hamstring Limitations red band   Moist Heat Therapy   Number Minutes Moist Heat 15 Minutes   Moist Heat Location Knee  Investment banker, operational IFC   Electrical Stimulation Parameters 15   Electrical Stimulation Goals Pain   Manual Therapy   Manual Therapy Soft tissue mobilization  to bil Hamstrings and gastrocnemius in sitting                  PT Short Term Goals - 10/25/15 1909    PT SHORT TERM GOAL #1   Title be independent in initial HEP   Status Achieved   PT SHORT TERM GOAL #2   Title report 20% reduction in bilateral LE pain with standing   Status Achieved   PT SHORT TERM GOAL #3   Title perform TUG in < or = to 17 seconds   Status Achieved           PT Long Term Goals - 11/05/15 1548    PT LONG TERM GOAL #1   Title be independence in advanced HEP   Time 8   Period Weeks   Status On-going   PT LONG TERM GOAL #2   Title perform TUG in < or = to 15 seconds to improve stability  November 05, 2014   TUG in 14 sec    Time 8    Period Weeks   Status Achieved   PT LONG TERM GOAL #3   Title improve LE strength to perform 5x sit to stand in < or = to 18 seconds (with use of arms)   Time 8   Period Weeks   Status On-going   PT LONG TERM GOAL #4   Title report a 30% reduction in bil. LE knee pain with standing activities   Time 8   Period Weeks   Status On-going               Plan - 11/05/15 1638    Clinical Impression Statement Pt arrives 6 min late he reports his legs feel better and PT is helping the Knee pain is improved since releasing the hamstrings with softtissue work. Nevertheless, pt fatiques easily with standing and seated exercises. Patient will continue to benefit from skilled PT to improve strenmgth and enduracne and release tension.     Pt will benefit from skilled therapeutic intervention in order to improve on the following deficits Abnormal gait;Decreased range of motion;Difficulty walking;Pain;Decreased strength;Decreased mobility;Decreased balance;Decreased activity tolerance;Decreased endurance   Rehab Potential Good   PT Frequency 2x / week   PT Duration 8 weeks   PT Treatment/Interventions ADLs/Self Care Home Management;Cryotherapy;Electrical Stimulation;Moist Heat;Therapeutic exercise;Therapeutic activities;Functional mobility training;Stair training;Gait training;Ultrasound;Neuromuscular re-education;Patient/family education;Manual techniques;Passive range of motion   PT Next Visit Plan Seated and standing LE strength, balance; modalities for pain control as need   Consulted and Agree with Plan of Care Patient        Problem List Patient Active Problem List   Diagnosis Date Noted  . Osteoarthritis, hand 05/05/2014  . Carpal tunnel syndrome 05/02/2014  . Eczema 01/23/2014  . Thrombocytopenia, unspecified ([place]) 11/19/2012  . Overactive bladder 08/24/2012  . Osteoarthritis of right knee 01/22/2012  . Benign positional vertigo 07/09/2011  . CAD, NATIVE VESSEL 10/22/2009  .  EDEMA 08/03/2009  . Obstructive sleep apnea 05/03/2009  . SKIN LESION 09/23/2007  . Diabetes mellitus type 2, uncontrolled ([place]) 03/23/2007  . Hyperlipidemia 03/23/2007  . Essential hypertension 03/23/2007  . COLONIC POLYPS, HX OF 03/23/2007  . Morbid obesity ([place]) 03/23/2007    NAUMANN-HOUEGNIFIO,Lakashia Collison PTA 11/05/2015, 4:42 PM  [facility] [address], [place], [place], [address] Phone: [phone]   Fax:  [phone]  Name: Jacob Bishop MRN: [id number] Date of Birth: Jan 24, 1941

## 2015-11-06 ENCOUNTER — Encounter: Payer: Medicare Other | Admitting: Physical Therapy

## 2015-11-06 DIAGNOSIS — N3941 Urge incontinence: Secondary | ICD-10-CM | POA: Diagnosis not present

## 2015-11-12 ENCOUNTER — Ambulatory Visit: Payer: Medicare Other | Admitting: Physical Therapy

## 2015-11-12 ENCOUNTER — Encounter: Payer: Self-pay | Admitting: Physical Therapy

## 2015-11-12 DIAGNOSIS — R269 Unspecified abnormalities of gait and mobility: Secondary | ICD-10-CM

## 2015-11-12 DIAGNOSIS — M25562 Pain in left knee: Secondary | ICD-10-CM | POA: Diagnosis not present

## 2015-11-12 DIAGNOSIS — M6281 Muscle weakness (generalized): Secondary | ICD-10-CM | POA: Diagnosis not present

## 2015-11-12 DIAGNOSIS — R262 Difficulty in walking, not elsewhere classified: Secondary | ICD-10-CM

## 2015-11-12 DIAGNOSIS — R29898 Other symptoms and signs involving the musculoskeletal system: Secondary | ICD-10-CM

## 2015-11-12 DIAGNOSIS — M25561 Pain in right knee: Secondary | ICD-10-CM | POA: Diagnosis not present

## 2015-11-12 NOTE — Therapy (Signed)
[facility] [address], [place], [place], [address] Phone: [phone]   Fax:  [phone]  Physical Therapy Treatment  Patient Details  Name: Jacob Bishop MRN: [id number] Date of Birth: 28-Dec-1940 Referring Provider: Rich Number, MD  Encounter Date: 11/12/2015      PT End of Session - 11/12/15 1631    Visit Number 11   Number of Visits 20   Date for PT Re-Evaluation 11/13/15   PT Start Time [id number]   PT Stop Time 1703   PT Time Calculation (min) 50 min   Activity Tolerance Patient limited by fatigue   Behavior During Therapy [facility] for tasks assessed/performed      Past Medical History  Diagnosis Date  . Hypertension   . Diabetes mellitus     type II  . CAD in native artery   . Hyperlipidemia   . Edema   . Obesity   . OSA (obstructive sleep apnea)   . Cellulitis   . Dermatitis   . Rhinosinusitis   . Skin lesion   . Positional vertigo   . Hx of colonic polyps   . History of kidney stones     Past Surgical History  Procedure Laterality Date  . Cardiac catheterization    . Coronary stent placement      3 stents /1997  . Colonoscopy    . Polypectomy      There were no vitals filed for this visit.  Visit Diagnosis:  Difficulty walking  Weakness of both lower extremities  Abnormality of gait      Subjective Assessment - 11/12/15 1626    Subjective Pt reports pain in bil knee is less since start of PT, blood circulation in both LE improved, what improved standing and walking tolerance, what is needed for pt.s work tasks.   Limitations Walking   How long can you walk comfortably? 20-30 minutes   Patient Stated Goals improve gait, improve LE strength, improve endurance   Currently in Pain? Yes   Pain Score 4    Pain Location Knee   Pain Orientation Right;Left   Pain Descriptors / Indicators Sharp;Sore   Pain Type Chronic pain   Pain Onset More than a month ago   Pain Frequency  Intermittent   Aggravating Factors  change in weather, ascending stairs, not sure why pain increases sometimes    Pain Relieving Factors rest   Multiple Pain Sites No            [facility] PT Assessment - 11/12/15 0001    Assessment   Medical Diagnosis Gait instability (R26.81)   Referring Provider Rich Number, MD   Onset Date/Surgical Date 09/17/14   Prior Therapy 6 months ago for knee pain   Precautions   Precautions Fall   Restrictions   Weight Bearing Restrictions No   Balance Screen   Has the patient fallen in the past 6 months Yes   How many times? 1  pusing carbage can to the street   Has the patient had a decrease in activity level because of a fear of falling?  Yes   Is the patient reluctant to leave their home because of a fear of falling?  Yes   [place] Private residence   Living Arrangements Spouse/significant other   Type of [place] to enter   Entrance Stairs-Number of Steps 3   Home Layout Two level   Alternate Level Stairs-Number  of Steps [place] None   Prior Function   Level of Independence Independent   Vocation Full time employment   Vocation Requirements pt owns a [place] none   Cognition   Overall Cognitive Status Within Functional Limits for tasks assessed   Posture/Postural Control   Posture/Postural Control Postural limitations   Postural Limitations Weight shift right;Weight shift left   ROM / Strength   AROM / PROM / Strength AROM   AROM   Overall AROM  Within functional limits for tasks performed   Overall AROM Comments Not formallly tested.  pt ambulates with reduced hip mobilty and demonstrates stiffness in bil. hips and knees with movement.     Strength   Overall Strength Deficits   Overall Strength Comments Bil hip flexor strength 4/5, knee extensoin 4+/5, flexion 4/5, DF 4+/5   Palpation   Palpation comment palpable tenderness over bilateral  knee joints, distal hamstrings and quads & gastrocnemius   Transfers   Transfers Sit to Stand   Five time sit to stand comments  25   Comments use of arms with 5x sit to stand   Ambulation/Gait   Ambulation/Gait Yes   Ambulation Distance (Feet) 100 Feet   Gait Pattern Decreased trunk rotation;Lateral trunk lean to left;Lateral trunk lean to right;Decreased step length - right;Decreased step length - left;Decreased arm swing - right;Decreased arm swing - left   Ambulation Surface Level                     [facility] Adult PT Treatment/Exercise - 11/12/15 0001    Knee/Hip Exercises: Aerobic   Nustep L1 x 8 min, seat #11, arms # 10   Knee/Hip Exercises: Standing   Heel Raises Both;20 reps   Hip Abduction AROM;Right;Left;1 set;10 reps  2.5# with pillow case due to sensitive skin   Hip Extension AROM;Right;Left;1 set;10 reps  2.5# with pillow case around ankle due to sensitive skin   Other Standing Knee Exercises Alternating step taps x 27min   Knee/Hip Exercises: Seated   Long Arc Quad Strengthening;Right;Left;1 set;10 reps;Weights   Long Arc Quad Weight 0 lbs.   Long Arc Quad Limitations red band   Hamstring Curl Strengthening;Right;Left;2 sets;10 reps   Hamstring Limitations red band   Moist Heat Therapy   Number Minutes Moist Heat 15 Minutes   Moist Heat Location Knee   Electrical Stimulation   Electrical Stimulation Location B knees   Electrical Stimulation Action IFC   Electrical Stimulation Parameters 15   Electrical Stimulation Goals Pain   Manual Therapy   Manual Therapy Soft tissue mobilization  to bil Hamstrings and gastrocnemius                  PT Short Term Goals - 11/12/15 1634    PT SHORT TERM GOAL #1   Title be independent in initial HEP   Time 4   Period Weeks   Status Achieved   PT SHORT TERM GOAL #2   Title report 20% reduction in bilateral LE pain with standing   Time 4   Period Weeks   Status Achieved   PT SHORT TERM GOAL #3    Title perform TUG in < or = to 17 seconds   Time 4   Period Weeks   Status Achieved           PT Long Term Goals - 11/12/15 1634    PT LONG TERM GOAL #1  Title be independence in advanced HEP   Time 8   Period Weeks   Status On-going   PT LONG TERM GOAL #2   Title perform TUG in < or = to 15 seconds to improve stability   Time 8   Period Weeks   Status Achieved   PT LONG TERM GOAL #3   Title improve LE strength to perform 5x sit to stand in < or = to 18 seconds (with use of arms)  25sec with B UE support x5 sit to stand   Time 8   Period Weeks   Status On-going   PT LONG TERM GOAL #4   Title report a 30% reduction in bil. LE knee pain with standing activities  50% reduction as of 11/12/15   Time 8   Period Weeks   Status Achieved               Plan - 11/12/15 1633    Clinical Impression Statement Pt continues to fatique easily with standing and seated exercises. patient will continue to benefit from skilled PT to improve stengh and endurance and release tension   Pt will benefit from skilled therapeutic intervention in order to improve on the following deficits Abnormal gait;Decreased range of motion;Difficulty walking;Pain;Decreased strength;Decreased mobility;Decreased balance;Decreased activity tolerance;Decreased endurance   Rehab Potential Good   PT Frequency 2x / week   PT Duration 8 weeks   PT Treatment/Interventions ADLs/Self Care Home Management;Cryotherapy;Electrical Stimulation;Moist Heat;Therapeutic exercise;Therapeutic activities;Functional mobility training;Stair training;Gait training;Ultrasound;Neuromuscular re-education;Patient/family education;Manual techniques;Passive range of motion   PT Next Visit Plan Seated and standing LE strength, balance; modalities for pain control as need   Consulted and Agree with Plan of Care Patient        Problem List Patient Active Problem List   Diagnosis Date Noted  . Osteoarthritis, hand 05/05/2014  .  Carpal tunnel syndrome 05/02/2014  . Eczema 01/23/2014  . Thrombocytopenia, unspecified ([place]) 11/19/2012  . Overactive bladder 08/24/2012  . Osteoarthritis of right knee 01/22/2012  . Benign positional vertigo 07/09/2011  . CAD, NATIVE VESSEL 10/22/2009  . EDEMA 08/03/2009  . Obstructive sleep apnea 05/03/2009  . SKIN LESION 09/23/2007  . Diabetes mellitus type 2, uncontrolled ([place]) 03/23/2007  . Hyperlipidemia 03/23/2007  . Essential hypertension 03/23/2007  . COLONIC POLYPS, HX OF 03/23/2007  . Morbid obesity ([place]) 03/23/2007    Jacob Bishop, PTA 11/12/2015 5:07 PM   [facility] [address], [place], [place], [address] Phone: [phone]   Fax:  [phone]  Name: Ido Glasson MRN: [id number] Date of Birth: 1941/07/16

## 2015-11-13 ENCOUNTER — Encounter: Payer: Self-pay | Admitting: Internal Medicine

## 2015-11-15 ENCOUNTER — Ambulatory Visit: Payer: Self-pay | Admitting: Physical Therapy

## 2015-11-19 ENCOUNTER — Ambulatory Visit: Payer: Self-pay

## 2015-11-20 ENCOUNTER — Encounter: Payer: Medicare Other | Admitting: Physical Therapy

## 2015-11-22 ENCOUNTER — Ambulatory Visit: Payer: Medicare Other | Admitting: Physical Therapy

## 2015-11-22 DIAGNOSIS — M6281 Muscle weakness (generalized): Secondary | ICD-10-CM

## 2015-11-22 DIAGNOSIS — M25561 Pain in right knee: Secondary | ICD-10-CM

## 2015-11-22 DIAGNOSIS — M25562 Pain in left knee: Secondary | ICD-10-CM

## 2015-11-22 DIAGNOSIS — R269 Unspecified abnormalities of gait and mobility: Secondary | ICD-10-CM | POA: Diagnosis not present

## 2015-11-22 DIAGNOSIS — R29898 Other symptoms and signs involving the musculoskeletal system: Secondary | ICD-10-CM | POA: Diagnosis not present

## 2015-11-22 DIAGNOSIS — R262 Difficulty in walking, not elsewhere classified: Secondary | ICD-10-CM | POA: Diagnosis not present

## 2015-11-22 NOTE — Therapy (Signed)
[facility] [address] [place], [place], [address] Phone: [phone]   Fax:  [phone]  Physical Therapy Treatment/Recertification  Patient Details  Name: Jacob Bishop MRN: [id number] Date of Birth: [birth year] Referring Provider: Rich Number, MD  Encounter Date: 11/22/2015      PT End of Session - 11/22/15 1706    Visit Number 12   Number of Visits 20   Date for PT Re-Evaluation 01/03/16   Authorization Type KX at 77;  G code at 53   PT Start Time 1530   PT Stop Time 1625   PT Time Calculation (min) 55 min   Activity Tolerance Patient limited by pain;Patient limited by fatigue      Past Medical History  Diagnosis Date  . Hypertension   . Diabetes mellitus     type II  . CAD in native artery   . Hyperlipidemia   . Edema   . Obesity   . OSA (obstructive sleep apnea)   . Cellulitis   . Dermatitis   . Rhinosinusitis   . Skin lesion   . Positional vertigo   . Hx of colonic polyps   . History of kidney stones     Past Surgical History  Procedure Laterality Date  . Cardiac catheterization    . Coronary stent placement      3 stents /1997  . Colonoscopy    . Polypectomy      There were no vitals filed for this visit.  Visit Diagnosis:  Difficulty walking - Plan: PT plan of care cert/re-cert  Bilateral knee pain - Plan: PT plan of care cert/re-cert  Muscle weakness (generalized) - Plan: PT plan of care cert/re-cert      Subjective Assessment - 11/22/15 1703    Subjective I'm hurting a lot today, I don't know why.  Some good days and some bad days.  Complains of difficulty getting out of his recliner.  Some vertigo today.   Currently in Pain? Yes   Pain Score 6    Pain Location Knee   Pain Orientation Right;Left   Pain Type Chronic pain   Pain Onset More than a month ago   Pain Frequency Constant   Aggravating Factors  getting up out of the recliner, change in weather, being up on  his feet   Pain Relieving Factors heat; e-stim            [facility] PT Assessment - 11/22/15 0001    Strength   Overall Strength Comments Bil hip flexor strength 4/5, knee extensoin 4/5, flexion 4/5, DF 4+/5 ;  Left grossly 4+/5   Transfers   Five time sit to stand comments  18.5   Timed Up and Go Test   Normal TUG (seconds) 17.8  c/o vertigo today                     [facility] Adult PT Treatment/Exercise - 11/22/15 0001    Knee/Hip Exercises: Aerobic   Nustep L 1 8 min   Knee/Hip Exercises: Seated   Long Arc Quad Strengthening;Right;Left;1 set;10 reps;Weights   Long Arc Quad Limitations red band   Hamstring Curl Strengthening;Right;Left;2 sets;10 reps   Hamstring Limitations red band   Moist Heat Therapy   Number Minutes Moist Heat 15 Minutes   Moist Heat Location Knee   Electrical Stimulation   Electrical Stimulation Location B knees   Electrical Stimulation Action IFC   Electrical Stimulation Parameters 15 min  Electrical Stimulation Goals Pain        Discussed adding blanket or pillow to recliner for ease with sit to stand.  Discussed pros/cons of electric lift chair at patient's request.          PT Short Term Goals - 11/22/15 1712    PT SHORT TERM GOAL #1   Title be independent in initial HEP   Status Achieved   PT SHORT TERM GOAL #2   Title report 20% reduction in bilateral LE pain with standing   Status Achieved   PT SHORT TERM GOAL #3   Title perform TUG in < or = to 17 seconds   Status Achieved           PT Long Term Goals - 11/22/15 1713    PT LONG TERM GOAL #1   Title be independence in advanced HEP   Time 8   Period Weeks   Status On-going   PT LONG TERM GOAL #2   Title perform TUG in < or = to 15 seconds to improve stability   Status Partially Met   PT LONG TERM GOAL #3   Title improve LE strength to perform 5x sit to stand in < or = to 18 seconds (with use of arms)   Status Achieved   PT LONG TERM GOAL #4   Status  Achieved   PT LONG TERM GOAL #5   Title Right LE hip flexor, quad, HS strength grossly 4+/5 needed for greater ease with sit to stand.   Time 6   Period Weeks   Status New               Plan - 11/22/15 1707    Clinical Impression Statement The patient reports variable improvement in his pain level but overall 30% better (last visit reported 50% better).  He has obvious Bilateral knee joint deformity (genu varus).  He also has other co-morbidities which have slowed progress with rehab.  He has made improvements in LE strength and Timed sit to stand test.  Some improvement in Timed up and Go test since initial eval but variable as well over the last 4 -6 weeks.  Patient asks about getting an electric lift chair, discussed pros and cons (weakening of quad muscles with this decreased use).  Without PT, he would have a decline in functional status.  Recommend continued PT for 6 more weeks for further strengthening and pain control strategies.     Pt will benefit from skilled therapeutic intervention in order to improve on the following deficits Abnormal gait;Decreased range of motion;Difficulty walking;Pain;Decreased strength;Decreased mobility;Decreased balance;Decreased activity tolerance;Decreased endurance   Rehab Potential Good   PT Frequency 2x / week   PT Duration 6 weeks   PT Treatment/Interventions ADLs/Self Care Home Management;Cryotherapy;Electrical Stimulation;Moist Heat;Therapeutic exercise;Therapeutic activities;Functional mobility training;Stair training;Gait training;Ultrasound;Neuromuscular re-education;Patient/family education;Manual techniques;Passive range of motion   PT Next Visit Plan Seated and standing LE strength, balance; modalities for pain control as need        Problem List Patient Active Problem List   Diagnosis Date Noted  . Osteoarthritis, hand 05/05/2014  . Carpal tunnel syndrome 05/02/2014  . Eczema 01/23/2014  . Thrombocytopenia, unspecified ([place])  11/19/2012  . Overactive bladder 08/24/2012  . Osteoarthritis of right knee 01/22/2012  . Benign positional vertigo 07/09/2011  . CAD, NATIVE VESSEL 10/22/2009  . EDEMA 08/03/2009  . Obstructive sleep apnea 05/03/2009  . SKIN LESION 09/23/2007  . Diabetes mellitus type 2, uncontrolled ([place]) 03/23/2007  . Hyperlipidemia  03/23/2007  . Essential hypertension 03/23/2007  . COLONIC POLYPS, HX OF 03/23/2007  . Morbid obesity ([place]) 03/23/2007    Alvera Singh 11/22/2015, 5:22 PM  [facility] [address], [place], [place], [address] Phone: [phone]   Fax:  [phone]  Name: Jacob Bishop MRN: [id number] Date of Birth: 10-Jul-1941  Ruben Im, PT 11/22/2015 5:23 PM Phone: [phone] Fax: [phone]

## 2015-11-26 ENCOUNTER — Encounter: Payer: Self-pay | Admitting: Physical Therapy

## 2015-11-26 ENCOUNTER — Ambulatory Visit: Payer: Medicare Other | Attending: Internal Medicine | Admitting: Physical Therapy

## 2015-11-26 DIAGNOSIS — M25562 Pain in left knee: Secondary | ICD-10-CM | POA: Diagnosis not present

## 2015-11-26 DIAGNOSIS — R2689 Other abnormalities of gait and mobility: Secondary | ICD-10-CM | POA: Diagnosis not present

## 2015-11-26 DIAGNOSIS — R262 Difficulty in walking, not elsewhere classified: Secondary | ICD-10-CM | POA: Insufficient documentation

## 2015-11-26 DIAGNOSIS — R29898 Other symptoms and signs involving the musculoskeletal system: Secondary | ICD-10-CM | POA: Insufficient documentation

## 2015-11-26 DIAGNOSIS — M25561 Pain in right knee: Secondary | ICD-10-CM | POA: Diagnosis not present

## 2015-11-26 DIAGNOSIS — R269 Unspecified abnormalities of gait and mobility: Secondary | ICD-10-CM | POA: Diagnosis not present

## 2015-11-26 DIAGNOSIS — M6281 Muscle weakness (generalized): Secondary | ICD-10-CM

## 2015-11-26 NOTE — Therapy (Addendum)
[facility] [address] [place], [place], [address] Phone: [phone]   Fax:  [phone]  Physical Therapy Treatment/Recertification  Patient Details  Name: Jasper Hanf MRN: [id number] Date of Birth: 31-Mar-1941 Referring Provider: Rich Number, MD  Encounter Date: 11/26/2015      PT End of Session - 11/26/15 1629    Visit Number 13   Number of Visits 20   Date for PT Re-Evaluation 01/03/16   Authorization Type KX at 44;  G code at 54   PT Start Time 1555   PT Stop Time 1635   PT Time Calculation (min) 40 min   Activity Tolerance Patient limited by pain;Patient limited by fatigue   Behavior During Therapy [facility] for tasks assessed/performed      Past Medical History  Diagnosis Date  . Hypertension   . Diabetes mellitus     type II  . CAD in native artery   . Hyperlipidemia   . Edema   . Obesity   . OSA (obstructive sleep apnea)   . Cellulitis   . Dermatitis   . Rhinosinusitis   . Skin lesion   . Positional vertigo   . Hx of colonic polyps   . History of kidney stones     Past Surgical History  Procedure Laterality Date  . Cardiac catheterization    . Coronary stent placement      3 stents /1997  . Colonoscopy    . Polypectomy      There were no vitals filed for this visit.  Visit Diagnosis:  Difficulty walking  Bilateral knee pain  Muscle weakness (generalized)  Weakness of both lower extremities  Abnormality of gait      Subjective Assessment - 11/26/15 1625    Subjective I'm hurting all over, however my walking is improving. Some vertigo today   Limitations Walking   How long can you walk comfortably? 20-30 minutes   Patient Stated Goals improve gait, improve LE strength, improve endurance   Currently in Pain? Yes   Pain Score 6    Pain Location Knee   Pain Orientation Right;Left   Pain Type Chronic pain   Pain Onset More than a month ago   Pain Frequency Constant   Aggravating Factors  getting up and out of the recliner, change in weather, being up on his feet   Pain Relieving Factors heat, e-stim   Multiple Pain Sites No                         [facility] Adult PT Treatment/Exercise - 11/26/15 0001    Knee/Hip Exercises: Aerobic   Nustep L 1 x11 min   Knee/Hip Exercises: Standing   Heel Raises Both;20 reps   Hip Abduction AROM;10 reps;Both;2 sets  2.5# with pillow case around due to sensitive skin    Hip Extension AROM;Both;20 reps  2.5# added   Other Standing Knee Exercises Alternating step taps x 77mn   Other Standing Knee Exercises Red physioball up the wall 2  x10   Knee/Hip Exercises: Seated   Long Arc Quad Strengthening;Right;Left;1 set;10 reps;Weights   Long Arc Quad Limitations red band   Hamstring Curl Strengthening;Right;Left;2 sets;10 reps   Hamstring Limitations red band   Abduction/Adduction  Both;20 reps  with red t-band   Moist Heat Therapy   Number Minutes Moist Heat 15 Minutes   Moist Heat Location Knee   Electrical Stimulation   Electrical Stimulation Location B  knees   Electrical Stimulation Action IFC   Electrical Stimulation Parameters 58mn   Electrical Stimulation Goals Pain                  PT Short Term Goals - 11/26/15 1646    PT SHORT TERM GOAL #1   Title be independent in initial HEP   Time 4   Period Weeks   Status Achieved   PT SHORT TERM GOAL #2   Title report 20% reduction in bilateral LE pain with standing   Time 4   Period Weeks   Status Achieved   PT SHORT TERM GOAL #3   Title perform TUG in < or = to 17 seconds   Time 4   Period Weeks   Status Achieved           PT Long Term Goals - 11/22/15 1713    PT LONG TERM GOAL #1   Title be independence in advanced HEP   Time 8   Period Weeks   Status On-going   PT LONG TERM GOAL #2   Title perform TUG in < or = to 15 seconds to improve stability   Status Partially Met   PT LONG TERM GOAL #3   Title improve LE  strength to perform 5x sit to stand in < or = to 18 seconds (with use of arms)   Status Achieved   PT LONG TERM GOAL #4   Status Achieved   PT LONG TERM GOAL #5   Title Right LE hip flexor, quad, HS strength grossly 4+/5 needed for greater ease with sit to stand.   Time 6   Period Weeks   Status New        Plan - 12/05/15 2011  Clinical Impression Statement Pt with obvious bil knee joint deformity  (genu varus). Pt with co-morbities what slows down progress. Pt will  continue to benefit from skilled Pt to advance strengthening and endurance  and control pain in bil knees  Rehab Potential Good  PT Frequency 2x / week  PT Duration 6 weeks  PT Treatment/Interventions ADLs/Self Care Home  Management;Cryotherapy;Electrical Stimulation;Moist Heat;Therapeutic  exercise;Therapeutic activities;Functional mobility training;Stair  training;Gait training;Ultrasound;Neuromuscular  re-education;Patient/family education;Manual techniques;Passive range of  motion  PT Next Visit Plan Seated and standing LE strength, balance; modalities  for pain control as need    Patient will benefit from skilled therapeutic intervention in order to improve the following deficits and impairments: Abnormal gait, Decreased range of motion, Difficulty walking, Pain, Decreased strength, Decreased mobility, Decreased balance, Decreased activity tolerance, Decreased endurance  Visit Diagnosis: Other abnormalities of gait and mobility (primary encounter diagnosis)  Pain in right knee  Muscle weakness (generalized)  Pain in left knee           Problem List Patient Active Problem List   Diagnosis Date Noted  . Osteoarthritis, hand 05/05/2014  . Carpal tunnel syndrome 05/02/2014  . Eczema 01/23/2014  . Thrombocytopenia, unspecified ([place] 11/19/2012  . Overactive bladder 08/24/2012  . Osteoarthritis of right knee 01/22/2012  . Benign positional vertigo 07/09/2011  . CAD, NATIVE VESSEL  10/22/2009  . EDEMA 08/03/2009  . Obstructive sleep apnea 05/03/2009  . SKIN LESION 09/23/2007  . Diabetes mellitus type 2, uncontrolled ([place] 03/23/2007  . Hyperlipidemia 03/23/2007  . Essential hypertension 03/23/2007  . COLONIC POLYPS, HX OF 03/23/2007  . Morbid obesity ([place] 03/23/2007  SRuben Im PT 12/05/2015 8:18 PM Phone: [phone]: [phone]   NAUMANN-HOUEGNIFIO,Elie Gragert PTA 11/26/2015, 4:59 PM  [facility] [address] [place], [place], [address] Phone: [phone]   Fax:  [phone]  Name: Wandell Scullion MRN: [id number] Date of Birth: 04/16/1941

## 2015-11-27 ENCOUNTER — Encounter: Payer: Medicare Other | Admitting: Physical Therapy

## 2015-11-27 DIAGNOSIS — N3941 Urge incontinence: Secondary | ICD-10-CM | POA: Diagnosis not present

## 2015-11-29 ENCOUNTER — Encounter: Payer: Self-pay | Admitting: Physical Therapy

## 2015-11-29 ENCOUNTER — Ambulatory Visit: Payer: Medicare Other | Admitting: Physical Therapy

## 2015-11-29 DIAGNOSIS — M25562 Pain in left knee: Secondary | ICD-10-CM

## 2015-11-29 DIAGNOSIS — M6281 Muscle weakness (generalized): Secondary | ICD-10-CM | POA: Diagnosis not present

## 2015-11-29 DIAGNOSIS — R29898 Other symptoms and signs involving the musculoskeletal system: Secondary | ICD-10-CM | POA: Diagnosis not present

## 2015-11-29 DIAGNOSIS — R2689 Other abnormalities of gait and mobility: Secondary | ICD-10-CM | POA: Diagnosis not present

## 2015-11-29 DIAGNOSIS — R262 Difficulty in walking, not elsewhere classified: Secondary | ICD-10-CM

## 2015-11-29 DIAGNOSIS — M25561 Pain in right knee: Secondary | ICD-10-CM

## 2015-11-29 NOTE — Therapy (Addendum)
[facility] [address], [place], [place], [address] Phone: [phone]   Fax:  [phone]  Physical Therapy Treatment/Discharge Summary  Patient Details  Name: Jacob Bishop MRN: [id number] Date of Birth: 1940-11-01 Referring Provider: Rich Number, MD  Encounter Date: 11/29/2015      PT End of Session - 11/29/15 1543    Visit Number 14   Number of Visits 20   Date for PT Re-Evaluation 01/03/16   Authorization Type KX at 81;  G code at 89   PT Start Time 1532   PT Stop Time 1612  pt had to leave early   PT Time Calculation (min) 40 min   Activity Tolerance Patient limited by pain;Patient limited by fatigue   Behavior During Therapy [facility] for tasks assessed/performed      Past Medical History  Diagnosis Date  . Hypertension   . Diabetes mellitus     type II  . CAD in native artery   . Hyperlipidemia   . Edema   . Obesity   . OSA (obstructive sleep apnea)   . Cellulitis   . Dermatitis   . Rhinosinusitis   . Skin lesion   . Positional vertigo   . Hx of colonic polyps   . History of kidney stones     Past Surgical History  Procedure Laterality Date  . Cardiac catheterization    . Coronary stent placement      3 stents /1997  . Colonoscopy    . Polypectomy      There were no vitals filed for this visit.  Visit Diagnosis:  Difficulty walking  Bilateral knee pain  Muscle weakness (generalized)  Weakness of both lower extremities      Subjective Assessment - 11/29/15 1541    Subjective Pt reports feeling good today.    Limitations Walking   How long can you walk comfortably? 20-30 minutes   Patient Stated Goals improve gait, improve LE strength, improve endurance   Currently in Pain? Yes   Pain Score 5    Pain Location Knee   Pain Orientation Right;Left   Pain Descriptors / Indicators Sharp;Sore   Pain Type Chronic pain   Pain Onset More than a month ago   Pain Frequency Constant    Aggravating Factors  getting up and out of the recliner, change in weather, being up on his feet   Pain Relieving Factors heat, e-stim   Multiple Pain Sites No                         [facility] Adult PT Treatment/Exercise - 11/29/15 0001    Knee/Hip Exercises: Stretches   Other Knee/Hip Stretches Iliopsoas stretch at stairs with reaching of UE x 10 each side   Knee/Hip Exercises: Standing   Heel Raises Both;20 reps   Rebounder 3 way weight  shift 1 min   Other Standing Knee Exercises Alternating step taps x 31mn   Other Standing Knee Exercises Red physioball up the wall 2  x10   Moist Heat Therapy   Number Minutes Moist Heat 15 Minutes   Moist Heat Location Knee   Electrical Stimulation   Electrical Stimulation Location B knees   Electrical Stimulation Action IFC   Electrical Stimulation Parameters 15   Electrical Stimulation Goals Pain                  PT Short Term Goals - 11/26/15 1646  PT SHORT TERM GOAL #1   Title be independent in initial HEP   Time 4   Period Weeks   Status Achieved   PT SHORT TERM GOAL #2   Title report 20% reduction in bilateral LE pain with standing   Time 4   Period Weeks   Status Achieved   PT SHORT TERM GOAL #3   Title perform TUG in < or = to 17 seconds   Time 4   Period Weeks   Status Achieved           PT Long Term Goals - 11/22/15 1713    PT LONG TERM GOAL #1   Title be independence in advanced HEP   Time 8   Period Weeks   Status On-going   PT LONG TERM GOAL #2   Title perform TUG in < or = to 15 seconds to improve stability   Status Partially Met   PT LONG TERM GOAL #3   Title improve LE strength to perform 5x sit to stand in < or = to 18 seconds (with use of arms)   Status Achieved   PT LONG TERM GOAL #4   Status Achieved   PT LONG TERM GOAL #5   Title Right LE hip flexor, quad, HS strength grossly 4+/5 needed for greater ease with sit to stand.   Time 6   Period Weeks   Status New                Plan - 11/29/15 1546    Clinical Impression Statement Pt with obvious bil knee deformity (genu varus). Pt with co-morbities what slows down progress. Pt with improved activity toleracne with standing exercises. Pt will continue to benefit from skilled PT to advance strengthening and endurance and tocontrol pain in bil knees.     Pt will benefit from skilled therapeutic intervention in order to improve on the following deficits Abnormal gait;Decreased range of motion;Difficulty walking;Pain;Decreased strength;Decreased mobility;Decreased balance;Decreased activity tolerance;Decreased endurance   Rehab Potential Good   PT Frequency 2x / week   PT Duration 6 weeks   PT Treatment/Interventions ADLs/Self Care Home Management;Cryotherapy;Electrical Stimulation;Moist Heat;Therapeutic exercise;Therapeutic activities;Functional mobility training;Stair training;Gait training;Ultrasound;Neuromuscular re-education;Patient/family education;Manual techniques;Passive range of motion   PT Next Visit Plan Seated and standing LE strength, balance; modalities for pain control as need   Consulted and Agree with Plan of Care Patient     G code:  Mobility  Current and Discharge CK, Goal CK  PHYSICAL THERAPY DISCHARGE SUMMARY  Visits from Start of Care: 14  Current functional level related to goals / functional outcomes: The patient cancelled his next scheduled appointments secondary to work conflicts.  He then called to state that although PT has helped, he has been very busy at work and is unable to continue.  Will discharge at this time per patient request with partial goals met.     Remaining deficits: See above   Education / Equipment: HEP Plan: Patient agrees to discharge.  Patient goals were partially met. Patient is being discharged due to the patient's request.  ?????        Problem List Patient Active Problem List   Diagnosis Date Noted  . Osteoarthritis, hand 05/05/2014   . Carpal tunnel syndrome 05/02/2014  . Eczema 01/23/2014  . Thrombocytopenia, unspecified ([place]) 11/19/2012  . Overactive bladder 08/24/2012  . Osteoarthritis of right knee 01/22/2012  . Benign positional vertigo 07/09/2011  . CAD, NATIVE VESSEL 10/22/2009  . EDEMA 08/03/2009  . Obstructive  sleep apnea 05/03/2009  . SKIN LESION 09/23/2007  . Diabetes mellitus type 2, uncontrolled ([place]) 03/23/2007  . Hyperlipidemia 03/23/2007  . Essential hypertension 03/23/2007  . COLONIC POLYPS, HX OF 03/23/2007  . Morbid obesity ([place]) 03/23/2007   Ruben Im, PT 12/13/2015 1:59 PM Phone: [phone] Fax: [phone] NAUMANN-HOUEGNIFIO,Nosson Wender PTA 11/29/2015, 4:07 PM  [facility] [address], [place], [place], [address] Phone: [phone]   Fax:  [phone]  Name: Jacob Bishop MRN: [id number] Date of Birth: Dec 06, 1940

## 2015-12-03 ENCOUNTER — Encounter: Payer: Self-pay | Admitting: Adult Health

## 2015-12-03 ENCOUNTER — Ambulatory Visit (INDEPENDENT_AMBULATORY_CARE_PROVIDER_SITE_OTHER): Payer: Medicare Other | Admitting: Adult Health

## 2015-12-03 VITALS — BP 120/60 | Temp 98.1°F | Wt 294.0 lb

## 2015-12-03 DIAGNOSIS — IMO0002 Reserved for concepts with insufficient information to code with codable children: Secondary | ICD-10-CM

## 2015-12-03 DIAGNOSIS — E1165 Type 2 diabetes mellitus with hyperglycemia: Secondary | ICD-10-CM

## 2015-12-03 DIAGNOSIS — E11620 Type 2 diabetes mellitus with diabetic dermatitis: Secondary | ICD-10-CM | POA: Diagnosis not present

## 2015-12-03 DIAGNOSIS — Z794 Long term (current) use of insulin: Secondary | ICD-10-CM | POA: Diagnosis not present

## 2015-12-03 LAB — POCT GLYCOSYLATED HEMOGLOBIN (HGB A1C): HEMOGLOBIN A1C: 8.8

## 2015-12-03 NOTE — Patient Instructions (Addendum)
Your A1c was 8.8  You really need to start taking all your medications and monitoring your blood sugar.   Diet is key for diabetes.  Follow up in 3 months

## 2015-12-03 NOTE — Progress Notes (Signed)
Subjective:    Patient ID: Jacob Bishop, male    DOB: [birth year], 75 y.o.   MRN: [id number]  HPI  75 year old male who presents to the office today for 3 month follow up regarding diabeties. He last saw PCP in January and his A1c was 8.7, this is up from 7.6.   He has not changed his diet and has not been monitoring his blood sugar on a daily basis. He does not take his medications as scheduled either.     Review of Systems  Respiratory: Negative.   Cardiovascular: Negative.   Gastrointestinal: Negative.   Neurological: Negative.   All other systems reviewed and are negative.  Past Medical History  Diagnosis Date  . Hypertension   . Diabetes mellitus     type II  . CAD in native artery   . Hyperlipidemia   . Edema   . Obesity   . OSA (obstructive sleep apnea)   . Cellulitis   . Dermatitis   . Rhinosinusitis   . Skin lesion   . Positional vertigo   . Hx of colonic polyps   . History of kidney stones     Social History   Social History  . Marital Status: Married    Spouse Name: N/A  . Number of Children: N/A  . Years of Education: N/A   Occupational History  . OWNER, restaurant    Social History Main Topics  . Smoking status: Former Smoker    Quit date: 01/10/1996  . Smokeless tobacco: Never Used  . Alcohol Use: No  . Drug Use: No  . Sexual Activity: Not on file   Other Topics Concern  . Not on file   Social History Narrative   Grew up in [place]. Mother was [place], Father [place].    Past Surgical History  Procedure Laterality Date  . Cardiac catheterization    . Coronary stent placement      3 stents /1997  . Colonoscopy    . Polypectomy      Family History  Problem Relation Age of Onset  . Cancer Mother   . Heart disease Father   . Heart disease Sister   . Diabetes Sister   . Colon cancer Sister     Allergies  Allergen Reactions  . Lipitor [Atorvastatin Calcium] Other (See Comments)    Muscle soreness    Current  Outpatient Prescriptions on File Prior to Visit  Medication Sig Dispense Refill  . amLODipine (NORVASC) 2.5 MG tablet TAKE 1 TABLET (2.5 MG TOTAL) BY MOUTH DAILY. 90 tablet 1  . aspirin 81 MG tablet Take 81 mg by mouth daily.      . fluticasone (FLONASE) 50 MCG/ACT nasal spray Place 2 sprays into both nostrils daily as needed. 16 g 3  . Insulin Glargine (TOUJEO SOLOSTAR) 300 UNIT/ML SOPN Inject 35 Units into the skin daily. 2 pen 0  . Insulin Pen Needle 32G X 4 MM MISC 2 each by Does not apply route daily. 200 each 3  . LANTUS SOLOSTAR 100 UNIT/ML Solostar Pen INJECT 30 TO 35 UNITS AT 10PM AS DIRECTED 15 pen 3  . Liraglutide (VICTOZA) 18 MG/3ML SOPN INJECT 0.2MLS INTO THE SKIN EVERY DAY AS DIRECTED 2 pen 0  . nateglinide (STARLIX) 120 MG tablet TAKE 1/2 TO ONE TABLET AS DIRECTED BEFORE AM AND EVENING MEAL 180 tablet 0  . simvastatin (ZOCOR) 20 MG tablet Take 1 tablet (20 mg total) by mouth at bedtime. 90 tablet 1  .  tamsulosin (FLOMAX) 0.4 MG CAPS capsule Take 0.4 mg by mouth daily.  11  . TOVIAZ 8 MG TB24 tablet Take 1 tablet by mouth daily.     No current facility-administered medications on file prior to visit.    BP 120/60 mmHg  Temp(Src) 98.1 F (36.7 C) (Oral)  Wt 294 lb (133.358 kg)       Objective:   Physical Exam  Constitutional: He is oriented to person, place, and time. He appears well-developed and well-nourished. No distress.  Cardiovascular: Normal rate, regular rhythm, normal heart sounds and intact distal pulses.  Exam reveals no gallop and no friction rub.   No murmur heard. Pulmonary/Chest: Effort normal and breath sounds normal. No respiratory distress. He has no wheezes. He has no rales. He exhibits no tenderness.  Neurological: He is alert and oriented to person, place, and time.  Skin: Skin is warm and dry. No rash noted. He is not diaphoretic. No erythema. No pallor.  Psychiatric: He has a normal mood and affect. His behavior is normal. Judgment and thought  content normal.  Nursing note and vitals reviewed.     Assessment & Plan:  1. Uncontrolled type 2 diabetes mellitus with diabetic dermatitis, with long-term current use of insulin ([place]) - Sample of Victoza given - POC HgB A1c - He needs to diet and exercise as well as take all medications as prescribed - Follow up in 3 months

## 2015-12-04 ENCOUNTER — Encounter: Payer: Medicare Other | Admitting: Physical Therapy

## 2015-12-05 ENCOUNTER — Ambulatory Visit: Payer: Medicare Other | Admitting: Internal Medicine

## 2015-12-05 NOTE — Addendum Note (Signed)
Addended by: Alvera Singh on: 12/05/2015 08:18 PM   Modules accepted: Orders

## 2015-12-06 ENCOUNTER — Ambulatory Visit: Payer: Medicare Other | Admitting: Physical Therapy

## 2015-12-10 ENCOUNTER — Ambulatory Visit: Payer: Medicare Other | Admitting: Physical Therapy

## 2015-12-10 ENCOUNTER — Encounter: Payer: Self-pay | Admitting: Internal Medicine

## 2015-12-10 DIAGNOSIS — M1712 Unilateral primary osteoarthritis, left knee: Secondary | ICD-10-CM | POA: Diagnosis not present

## 2015-12-10 DIAGNOSIS — M170 Bilateral primary osteoarthritis of knee: Secondary | ICD-10-CM | POA: Diagnosis not present

## 2015-12-10 DIAGNOSIS — M25561 Pain in right knee: Secondary | ICD-10-CM | POA: Diagnosis not present

## 2015-12-10 DIAGNOSIS — M25562 Pain in left knee: Secondary | ICD-10-CM | POA: Diagnosis not present

## 2015-12-10 DIAGNOSIS — M1711 Unilateral primary osteoarthritis, right knee: Secondary | ICD-10-CM | POA: Diagnosis not present

## 2015-12-13 ENCOUNTER — Encounter: Payer: Medicare Other | Admitting: Physical Therapy

## 2015-12-17 ENCOUNTER — Encounter: Payer: Medicare Other | Admitting: Physical Therapy

## 2015-12-17 DIAGNOSIS — M170 Bilateral primary osteoarthritis of knee: Secondary | ICD-10-CM | POA: Diagnosis not present

## 2015-12-17 DIAGNOSIS — M25561 Pain in right knee: Secondary | ICD-10-CM | POA: Diagnosis not present

## 2015-12-17 DIAGNOSIS — M25562 Pain in left knee: Secondary | ICD-10-CM | POA: Diagnosis not present

## 2015-12-18 DIAGNOSIS — N3941 Urge incontinence: Secondary | ICD-10-CM | POA: Diagnosis not present

## 2015-12-20 ENCOUNTER — Encounter: Payer: Medicare Other | Admitting: Physical Therapy

## 2016-01-07 ENCOUNTER — Other Ambulatory Visit: Payer: Self-pay | Admitting: Internal Medicine

## 2016-01-08 DIAGNOSIS — N3941 Urge incontinence: Secondary | ICD-10-CM | POA: Diagnosis not present

## 2016-01-17 ENCOUNTER — Ambulatory Visit (AMBULATORY_SURGERY_CENTER): Payer: Self-pay

## 2016-01-17 VITALS — Ht 71.0 in | Wt 295.6 lb

## 2016-01-17 DIAGNOSIS — Z860100 Personal history of colon polyps, unspecified: Secondary | ICD-10-CM

## 2016-01-17 MED ORDER — SUPREP BOWEL PREP KIT 17.5-3.13-1.6 GM/177ML PO SOLN: 1.0000 | Freq: Once | ORAL | Status: DC

## 2016-01-17 NOTE — Progress Notes (Signed)
No allergies to eggs or soy No past problems with anesthesia No home oxygen No diet meds  Has email and internet; declined emmi 

## 2016-01-29 DIAGNOSIS — N3941 Urge incontinence: Secondary | ICD-10-CM | POA: Diagnosis not present

## 2016-01-31 ENCOUNTER — Encounter: Payer: Self-pay | Admitting: Internal Medicine

## 2016-01-31 ENCOUNTER — Ambulatory Visit (AMBULATORY_SURGERY_CENTER): Payer: Medicare Other | Admitting: Internal Medicine

## 2016-01-31 VITALS — BP 120/73 | HR 77 | Temp 97.7°F | Resp 30 | Ht 71.0 in | Wt 295.0 lb

## 2016-01-31 DIAGNOSIS — D123 Benign neoplasm of transverse colon: Secondary | ICD-10-CM | POA: Diagnosis not present

## 2016-01-31 DIAGNOSIS — Z860100 Personal history of colon polyps, unspecified: Secondary | ICD-10-CM

## 2016-01-31 DIAGNOSIS — D122 Benign neoplasm of ascending colon: Secondary | ICD-10-CM | POA: Diagnosis not present

## 2016-01-31 DIAGNOSIS — K648 Other hemorrhoids: Secondary | ICD-10-CM

## 2016-01-31 DIAGNOSIS — Z1211 Encounter for screening for malignant neoplasm of colon: Secondary | ICD-10-CM | POA: Diagnosis not present

## 2016-01-31 DIAGNOSIS — G4733 Obstructive sleep apnea (adult) (pediatric): Secondary | ICD-10-CM | POA: Diagnosis not present

## 2016-01-31 DIAGNOSIS — K5790 Diverticulosis of intestine, part unspecified, without perforation or abscess without bleeding: Secondary | ICD-10-CM

## 2016-01-31 HISTORY — DX: Other hemorrhoids: K64.8

## 2016-01-31 HISTORY — DX: Diverticulosis of intestine, part unspecified, without perforation or abscess without bleeding: K57.90

## 2016-01-31 HISTORY — DX: Personal history of colon polyps, unspecified: Z86.0100

## 2016-01-31 HISTORY — DX: Personal history of colonic polyps: Z86.010

## 2016-01-31 LAB — GLUCOSE, CAPILLARY
Glucose-Capillary: 133 mg/dL — ABNORMAL HIGH (ref 65–99)
Glucose-Capillary: 116 mg/dL — ABNORMAL HIGH (ref 65–99)

## 2016-01-31 MED ORDER — SODIUM CHLORIDE 0.9 % IV SOLN: 500.0000 mL | INTRAVENOUS | Status: DC

## 2016-01-31 NOTE — Op Note (Signed)
[place] Patient Name: Jacob Bishop Procedure Date: 01/31/2016 10:28 AM MRN: [id number] Endoscopist: Jerene Bears , MD Age: 75 Referring MD:  Date of Birth: 09-Apr-1941 Gender: Male Procedure:                Colonoscopy Indications:              Surveillance: Personal history of adenomatous                            polyps on last colonoscopy 3 years ago Medicines:                Monitored Anesthesia Care Procedure:                Pre-Anesthesia Assessment:                           - Prior to the procedure, a History and Physical                            was performed, and patient medications and                            allergies were reviewed. The patient's tolerance of                            previous anesthesia was also reviewed. The risks                            and benefits of the procedure and the sedation                            options and risks were discussed with the patient.                            All questions were answered, and informed consent                            was obtained. Prior Anticoagulants: The patient has                            taken no previous anticoagulant or antiplatelet                            agents. ASA Grade Assessment: III - A patient with                            severe systemic disease. After reviewing the risks                            and benefits, the patient was deemed in                            satisfactory condition to undergo the procedure.  After obtaining informed consent, the colonoscope                            was passed under direct vision. Throughout the                            procedure, the patient's blood pressure, pulse, and                            oxygen saturations were monitored continuously. The                            Model CF-HQ190L [id number]) scope was introduced                            through the anus and advanced to the the cecum,                           identified by appendiceal orifice and ileocecal                            valve. The colonoscopy was performed without                            difficulty. The patient tolerated the procedure                            well. The quality of the bowel preparation was                            good. The ileocecal valve, appendiceal orifice, and                            rectum were photographed. The bowel preparation                            used was 2 day SUPREP. Scope In: 10:44:04 AM Scope Out: 11:02:08 AM Scope Withdrawal Time: 0 hours 15 minutes 36 seconds  Total Procedure Duration: 0 hours 18 minutes 4 seconds  Findings:                 The digital rectal exam was normal.                           A 10 mm polyp was found in the distal ascending                            colon. This polyp was located on top of the                            previously seen lipoma. The polyp was sessile. The                            polyp was removed with a hot snare.  Resection and                            retrieval were complete.                           A 5 mm polyp was found in the transverse colon. The                            polyp was sessile. The polyp was removed with a                            cold snare. Resection and retrieval were complete.                           A few diverticula were found in the ascending colon                            and cecum.                           Internal hemorrhoids were found during                            retroflexion. The hemorrhoids were large. Complications:            No immediate complications. Estimated Blood Loss:     Estimated blood loss: none. Impression:               - One 10 mm polyp in the distal ascending colon                            located on a lipoma, removed with a hot snare.                            Resected and retrieved.                           - One 5 mm polyp in the transverse  colon, removed                            with a cold snare. Resected and retrieved.                           - Diverticulosis in the ascending colon and in the                            cecum.                           - Internal hemorrhoids. Recommendation:           - Patient has a contact number available for                            emergencies. The signs and symptoms of potential  delayed complications were discussed with the                            patient. Return to normal activities tomorrow.                            Written discharge instructions were provided to the                            patient.                           - Resume previous diet.                           - Continue present medications.                           - No aspirin, ibuprofen, naproxen, or other                            non-steroidal anti-inflammatory drugs for 2 weeks                            after polyp removal.                           - Await pathology results.                           - Repeat colonoscopy is recommended for adenoma                            surveillance. The colonoscopy date will be                            determined after pathology results from today's                            exam become available for review. Jerene Bears, MD 01/31/2016 11:11:05 AM This report has been signed electronically.

## 2016-01-31 NOTE — Progress Notes (Signed)
A/ox3 pleased with MAC, report to Tracy RN 

## 2016-01-31 NOTE — Patient Instructions (Signed)
Resume previous diet, continue present medications, No aspirin, ibuprofen, naproxen, or other non-steriodal anti-inflammatory drugs for two weeks.   Repeat colonoscopy depending on pathology results.  YOU HAD AN ENDOSCOPIC PROCEDURE TODAY AT [facility]:   Refer to the procedure report that was given to you for any specific questions about what was found during the examination.  If the procedure report does not answer your questions, please call your gastroenterologist to clarify.  If you requested that your care partner not be given the details of your procedure findings, then the procedure report has been included in a sealed envelope for you to review at your convenience later.  YOU SHOULD EXPECT: Some feelings of bloating in the abdomen. Passage of more gas than usual.  Walking can help get rid of the air that was put into your GI tract during the procedure and reduce the bloating. If you had a lower endoscopy (such as a colonoscopy or flexible sigmoidoscopy) you may notice spotting of blood in your stool or on the toilet paper. If you underwent a bowel prep for your procedure, you may not have a normal bowel movement for a few days.  Please Note:  You might notice some irritation and congestion in your nose or some drainage.  This is from the oxygen used during your procedure.  There is no need for concern and it should clear up in a day or so.  SYMPTOMS TO REPORT IMMEDIATELY:   Following lower endoscopy (colonoscopy or flexible sigmoidoscopy):  Excessive amounts of blood in the stool  Significant tenderness or worsening of abdominal pains  Swelling of the abdomen that is new, acute  Fever of 100F or higher   Following upper endoscopy (EGD)  Vomiting of blood or coffee ground material  New chest pain or pain under the shoulder blades  Painful or persistently difficult swallowing  New shortness of breath  Fever of 100F or higher  Black, tarry-looking stools  For  urgent or emergent issues, a gastroenterologist can be reached at any hour by calling [phone].   DIET: Your first meal following the procedure should be a small meal and then it is ok to progress to your normal diet. Heavy or fried foods are harder to digest and may make you feel nauseous or bloated.  Likewise, meals heavy in dairy and vegetables can increase bloating.  Drink plenty of fluids but you should avoid alcoholic beverages for 24 hours.  ACTIVITY:  You should plan to take it easy for the rest of today and you should NOT DRIVE or use heavy machinery until tomorrow (because of the sedation medicines used during the test).    FOLLOW UP: Our staff will call the number listed on your records the next business day following your procedure to check on you and address any questions or concerns that you may have regarding the information given to you following your procedure. If we do not reach you, we will leave a message.  However, if you are feeling well and you are not experiencing any problems, there is no need to return our call.  We will assume that you have returned to your regular daily activities without incident.  If any biopsies were taken you will be contacted by phone or by letter within the next 1-3 weeks.  Please call us at [phone] if you have not heard about the biopsies in 3 weeks.    SIGNATURES/CONFIDENTIALITY: You and/or your care partner have signed paperwork which will  be entered into your electronic medical record.  These signatures attest to the fact that that the information above on your After Visit Summary has been reviewed and is understood.  Full responsibility of the confidentiality of this discharge information lies with you and/or your care-partner. 

## 2016-01-31 NOTE — Progress Notes (Signed)
Called to room to assist during endoscopic procedure.  Patient ID and intended procedure confirmed with present staff. Received instructions for my participation in the procedure from the performing physician.  

## 2016-02-01 ENCOUNTER — Telehealth: Payer: Self-pay | Admitting: *Deleted

## 2016-02-01 NOTE — Telephone Encounter (Signed)
  Follow up Call-  Call back number 01/31/2016  Post procedure Call Back phone  # [phone]  Permission to leave phone message Yes     Patient questions:  Do you have a fever, pain , or abdominal swelling? No. Pain Score  0 *  Have you tolerated food without any problems? Yes.    Have you been able to return to your normal activities? Yes.    Do you have any questions about your discharge instructions: Diet   No. Medications  No. Follow up visit  No.  Do you have questions or concerns about your Care? No.  Actions: * If pain score is 4 or above: No action needed, pain <4.

## 2016-02-05 ENCOUNTER — Encounter: Payer: Self-pay | Admitting: Internal Medicine

## 2016-02-17 ENCOUNTER — Other Ambulatory Visit: Payer: Self-pay | Admitting: Internal Medicine

## 2016-02-19 DIAGNOSIS — N3941 Urge incontinence: Secondary | ICD-10-CM | POA: Diagnosis not present

## 2016-03-04 ENCOUNTER — Encounter: Payer: Self-pay | Admitting: Adult Health

## 2016-03-04 ENCOUNTER — Ambulatory Visit (INDEPENDENT_AMBULATORY_CARE_PROVIDER_SITE_OTHER): Payer: Medicare Other | Admitting: Adult Health

## 2016-03-04 VITALS — BP 130/60 | Temp 98.1°F | Ht 71.0 in | Wt 293.9 lb

## 2016-03-04 DIAGNOSIS — E1165 Type 2 diabetes mellitus with hyperglycemia: Secondary | ICD-10-CM

## 2016-03-04 DIAGNOSIS — E11620 Type 2 diabetes mellitus with diabetic dermatitis: Secondary | ICD-10-CM | POA: Diagnosis not present

## 2016-03-04 DIAGNOSIS — IMO0002 Reserved for concepts with insufficient information to code with codable children: Secondary | ICD-10-CM

## 2016-03-04 DIAGNOSIS — Z794 Long term (current) use of insulin: Secondary | ICD-10-CM | POA: Diagnosis not present

## 2016-03-04 LAB — POCT GLYCOSYLATED HEMOGLOBIN (HGB A1C): HEMOGLOBIN A1C: 7.3

## 2016-03-04 NOTE — Progress Notes (Signed)
Subjective:    Patient ID: Jacob Bishop, male    DOB: 1941-01-16, 75 y.o.   MRN: [id number]  HPI  75 year old male who presents to the office today for 3 month follow up regarding diabetes. I last saw him on 11/2015 at which time his A1c was 8.7, this was up from 7.6. He had not changed his diet, was not taking his medication as directed and was not checking his blood sugars at home.   He reports that " I am trying to diet, but it is hard."  He has been checking his blood sugars at home and reports them to be in the 150's.   He takes his insulin daily but takes the Starlix when he has a big meal.   He has not started exercising.   Review of Systems  Constitutional: Negative.   Respiratory: Negative.   Cardiovascular: Negative.   Genitourinary: Negative.   All other systems reviewed and are negative.  Past Medical History  Diagnosis Date  . Hypertension   . Diabetes mellitus     type II  . CAD in native artery   . Hyperlipidemia   . Edema   . Obesity   . OSA (obstructive sleep apnea)     cpap  . Cellulitis   . Dermatitis   . Rhinosinusitis   . Skin lesion   . Positional vertigo   . Hx of colonic polyps   . History of kidney stones     Social History   Social History  . Marital Status: Married    Spouse Name: N/A  . Number of Children: N/A  . Years of Education: N/A   Occupational History  . OWNER, restaurant    Social History Main Topics  . Smoking status: Former Smoker    Quit date: 01/10/1996  . Smokeless tobacco: Never Used  . Alcohol Use: No  . Drug Use: No  . Sexual Activity: Not on file   Other Topics Concern  . Not on file   Social History Narrative   Grew up in [place]. Mother was [place], Father [place].    Past Surgical History  Procedure Laterality Date  . Cardiac catheterization    . Coronary stent placement      3 stents /1997  . Colonoscopy    . Polypectomy      Family History  Problem Relation Age of Onset  . Cancer  Mother   . Heart disease Father   . Heart disease Sister   . Diabetes Sister     Allergies  Allergen Reactions  . Lipitor [Atorvastatin Calcium] Other (See Comments)    Muscle soreness    Current Outpatient Prescriptions on File Prior to Visit  Medication Sig Dispense Refill  . amLODipine (NORVASC) 2.5 MG tablet TAKE 1 TABLET (2.5 MG TOTAL) BY MOUTH DAILY. 90 tablet 1  . aspirin 81 MG tablet Take 81 mg by mouth daily.      . fluticasone (FLONASE) 50 MCG/ACT nasal spray Place 2 sprays into both nostrils daily as needed. 16 g 3  . Insulin Pen Needle 32G X 4 MM MISC 2 each by Does not apply route daily. 200 each 3  . LANTUS SOLOSTAR 100 UNIT/ML Solostar Pen INJECT 30 TO 35 UNITS AT 10PM AS DIRECTED 15 pen 3  . nateglinide (STARLIX) 120 MG tablet TAKE 1/2 TO ONE TABLET AS DIRECTED BEFORE AM AND EVENING MEAL 180 tablet 0  . simvastatin (ZOCOR) 20 MG tablet TAKE 1 TABLET (  20 MG TOTAL) BY MOUTH AT BEDTIME. 90 tablet 0  . tamsulosin (FLOMAX) 0.4 MG CAPS capsule Take 0.4 mg by mouth daily.  11  . TOVIAZ 8 MG TB24 tablet Take 1 tablet by mouth daily.    Marland Kitchen VICTOZA 18 MG/3ML SOPN INJECT 0.2MLS INTO THE SKIN EVERY DAY AS DIRECTED 2 pen 0   No current facility-administered medications on file prior to visit.    BP 130/60 mmHg  Temp(Src) 98.1 F (36.7 C) (Oral)  Ht 5\' 11"  (1.803 m)  Wt 293 lb 14.4 oz (133.312 kg)  BMI 41.01 kg/m2       Objective:   Physical Exam  Constitutional: He is oriented to person, place, and time. He appears well-developed and well-nourished. No distress.  Cardiovascular: Normal rate, regular rhythm, normal heart sounds and intact distal pulses.  Exam reveals no gallop and no friction rub.   No murmur heard. Pulmonary/Chest: Effort normal and breath sounds normal. No respiratory distress. He has no wheezes. He has no rales. He exhibits no tenderness.  Neurological: He is alert and oriented to person, place, and time.  Skin: Skin is warm and dry. No rash noted. He  is not diaphoretic. No erythema. No pallor.  Psychiatric: He has a normal mood and affect. His behavior is normal. Judgment and thought content normal.  Nursing note and vitals reviewed.      Assessment & Plan:  1. Uncontrolled type 2 diabetes mellitus with diabetic dermatitis, with long-term current use of insulin (HCC) - POC HgB A1c- 7.3, improved from 8.8 - Continue with current regimen  - Follow up in 3 months or sooner if needed  [name], NP

## 2016-03-04 NOTE — Patient Instructions (Signed)
It was great seeing you again   Your A1c today is 7.3   Continue to work on diet and start exercising, even if it is just walking.   Please schedule an appointment to establish care with me   Follow up in three months or sooner if needed

## 2016-03-09 IMAGING — CR DG CHEST 2V
2 series · 2 of 2 positions shown · non-contrast
Comparison: None.

CLINICAL DATA: Followup recent pneumonia.  No current complaints.

EXAM:
CHEST  2 VIEW

[view not recorded (1 of 2)]
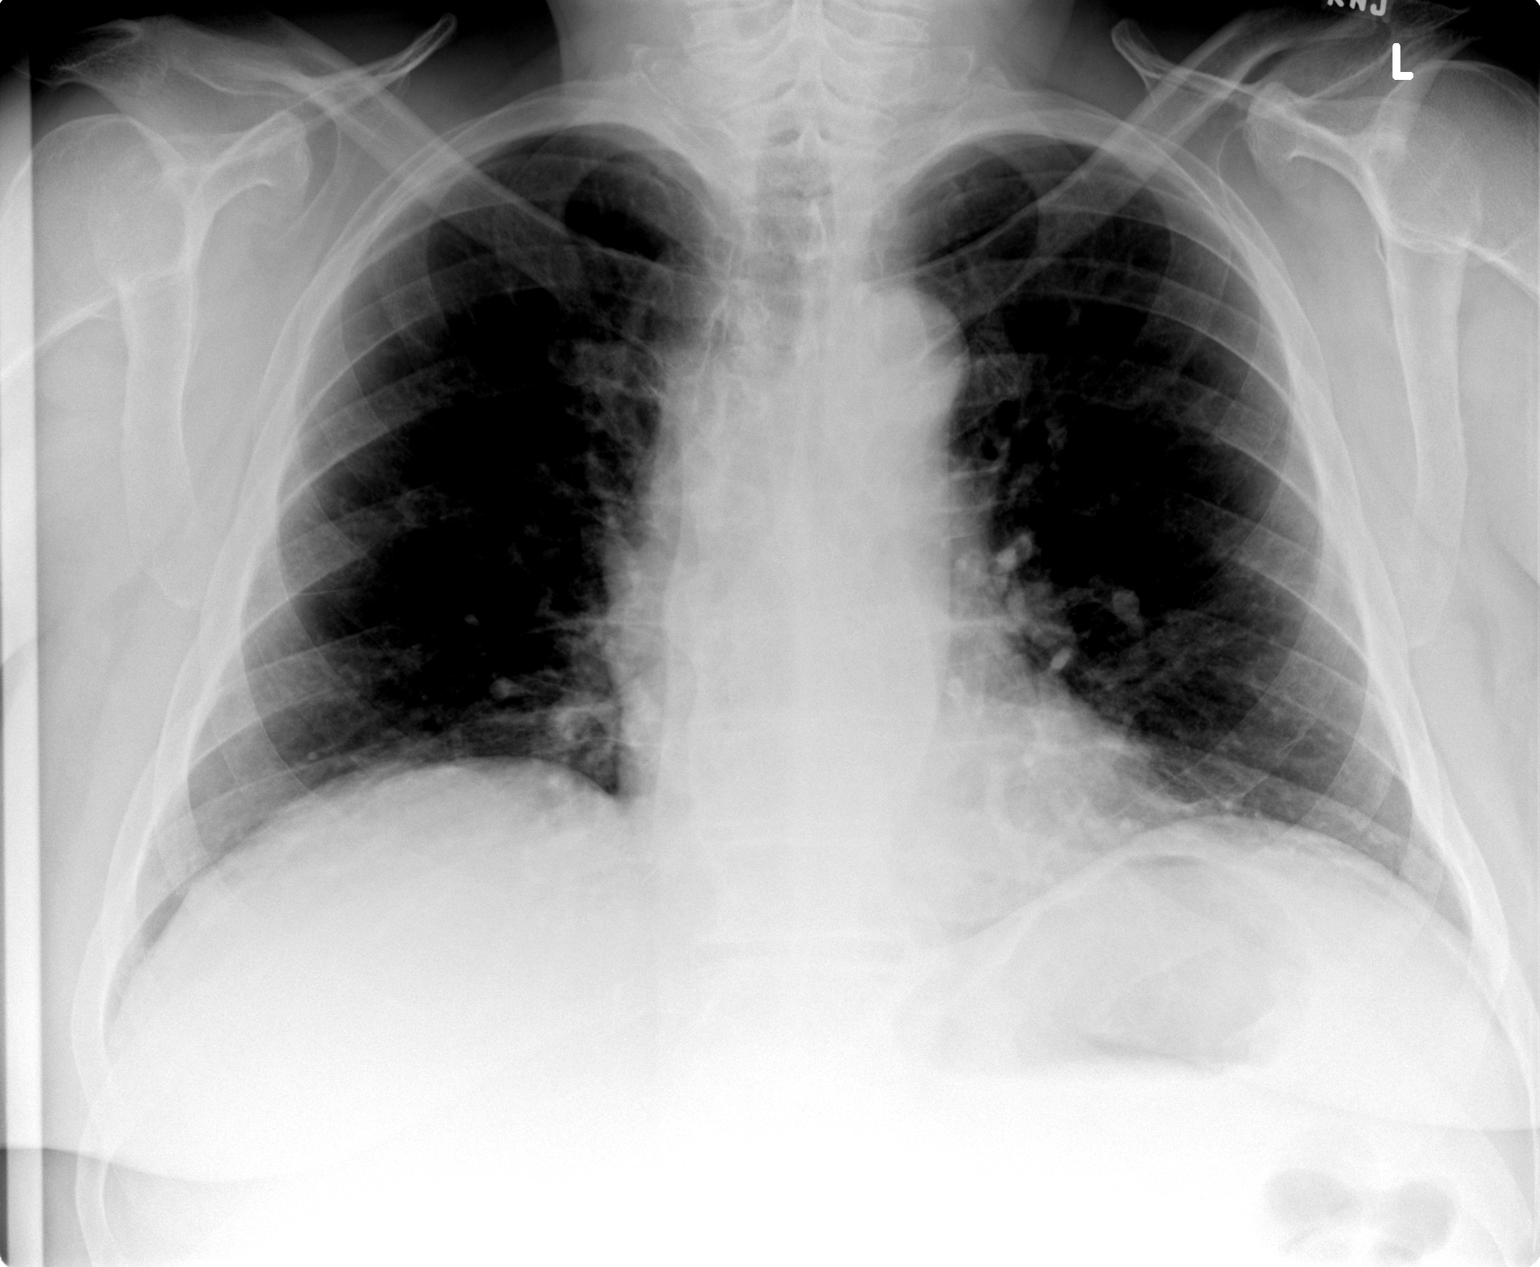

[view not recorded (2 of 2)]
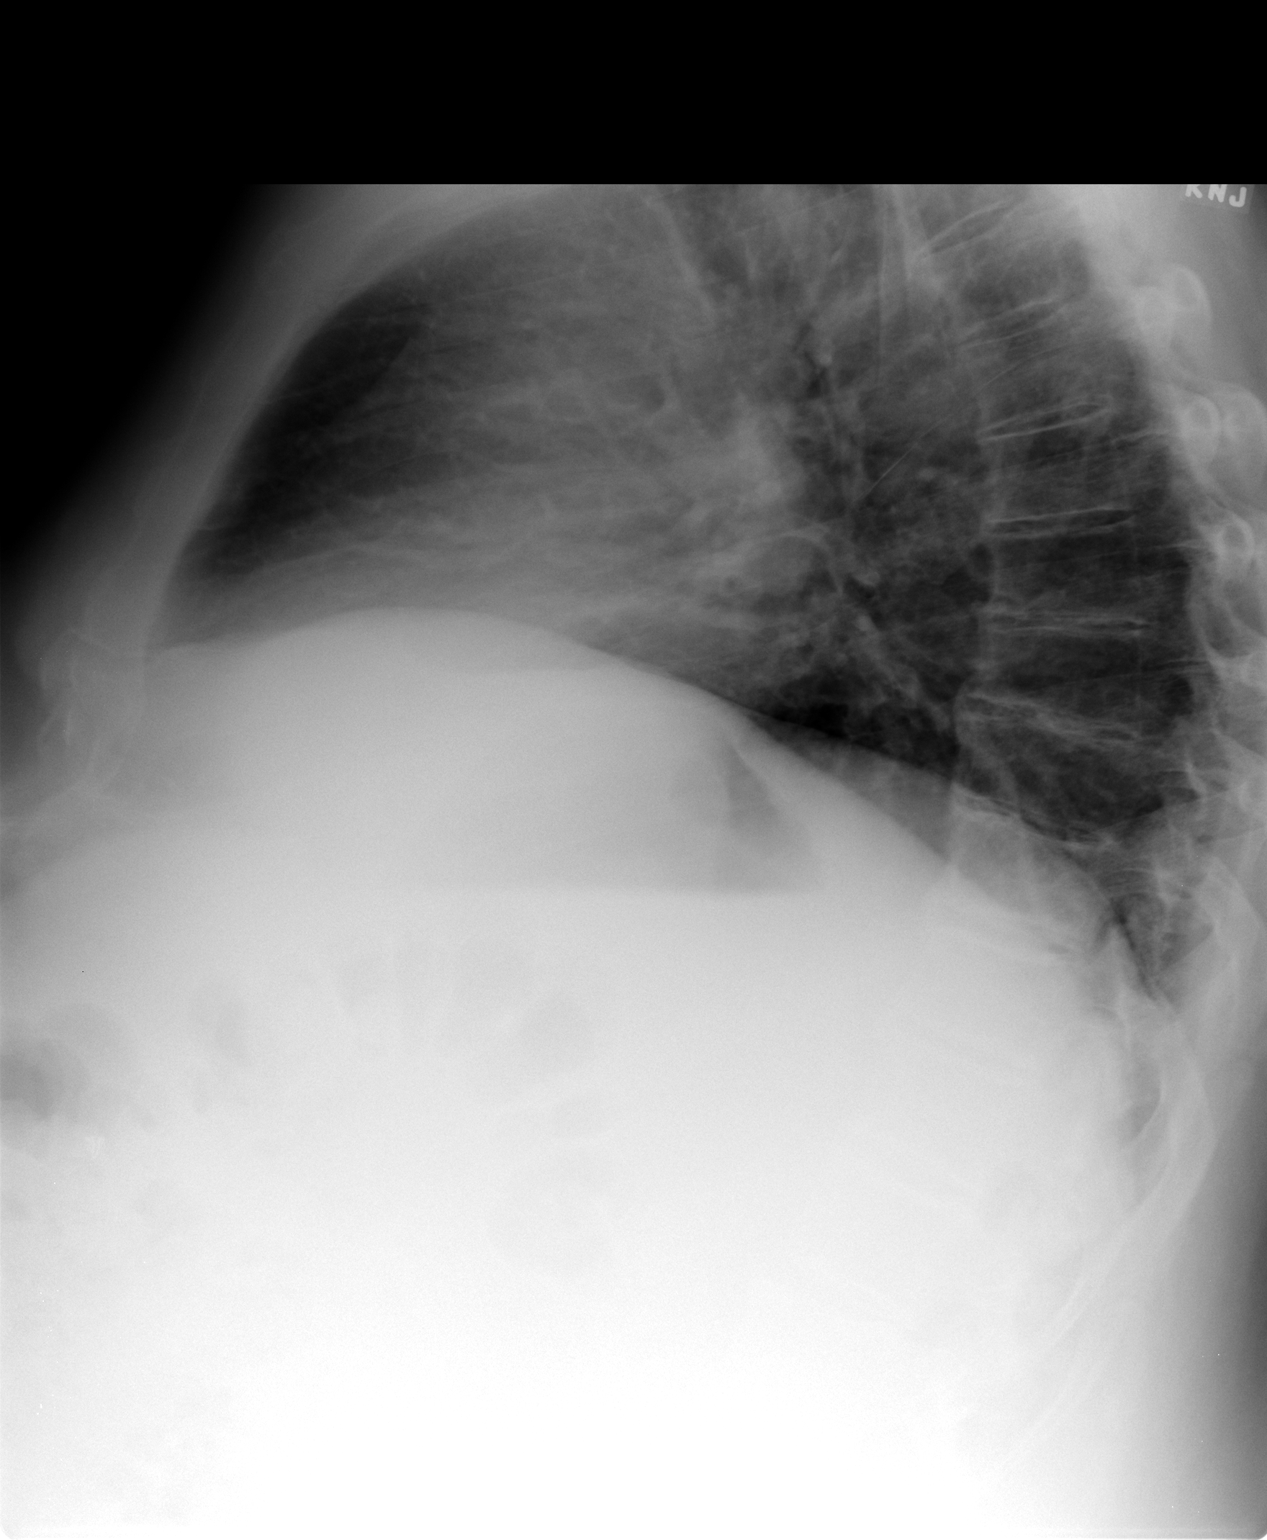

[2 of 2 positions shown; findings below may reference images not displayed]

FINDINGS: The heart size and mediastinal contours are within normal limits.

Lung volumes are relatively low. There is mild bronchovascular
crowding at the bases. Lungs are otherwise clear. No pleural
effusion. No pneumothorax.

The bony thorax is demineralized but intact.
IMPRESSION: No active cardiopulmonary disease.

## 2016-03-11 DIAGNOSIS — N3941 Urge incontinence: Secondary | ICD-10-CM | POA: Diagnosis not present

## 2016-03-20 ENCOUNTER — Other Ambulatory Visit: Payer: Self-pay | Admitting: Adult Health

## 2016-04-07 ENCOUNTER — Other Ambulatory Visit: Payer: Self-pay | Admitting: Internal Medicine

## 2016-04-08 ENCOUNTER — Other Ambulatory Visit: Payer: Self-pay | Admitting: Adult Health

## 2016-04-08 DIAGNOSIS — N3941 Urge incontinence: Secondary | ICD-10-CM | POA: Diagnosis not present

## 2016-04-25 ENCOUNTER — Other Ambulatory Visit: Payer: Self-pay

## 2016-04-28 ENCOUNTER — Other Ambulatory Visit: Payer: Self-pay | Admitting: Adult Health

## 2016-04-29 DIAGNOSIS — N3941 Urge incontinence: Secondary | ICD-10-CM | POA: Diagnosis not present

## 2016-04-29 NOTE — Telephone Encounter (Signed)
Ok to refill 

## 2016-05-14 ENCOUNTER — Other Ambulatory Visit: Payer: Self-pay | Admitting: Adult Health

## 2016-05-15 NOTE — Telephone Encounter (Signed)
Ok to refill for one year  

## 2016-05-20 DIAGNOSIS — N3941 Urge incontinence: Secondary | ICD-10-CM | POA: Diagnosis not present

## 2016-06-04 ENCOUNTER — Ambulatory Visit (INDEPENDENT_AMBULATORY_CARE_PROVIDER_SITE_OTHER): Payer: Medicare Other | Admitting: Adult Health

## 2016-06-04 ENCOUNTER — Encounter: Payer: Self-pay | Admitting: Adult Health

## 2016-06-04 VITALS — BP 130/80 | Temp 98.7°F | Ht 71.0 in | Wt 295.5 lb

## 2016-06-04 DIAGNOSIS — R0609 Other forms of dyspnea: Secondary | ICD-10-CM | POA: Diagnosis not present

## 2016-06-04 DIAGNOSIS — Z23 Encounter for immunization: Secondary | ICD-10-CM | POA: Diagnosis not present

## 2016-06-04 DIAGNOSIS — Z794 Long term (current) use of insulin: Secondary | ICD-10-CM

## 2016-06-04 DIAGNOSIS — E785 Hyperlipidemia, unspecified: Secondary | ICD-10-CM

## 2016-06-04 DIAGNOSIS — E11620 Type 2 diabetes mellitus with diabetic dermatitis: Secondary | ICD-10-CM | POA: Diagnosis not present

## 2016-06-04 DIAGNOSIS — E1165 Type 2 diabetes mellitus with hyperglycemia: Secondary | ICD-10-CM | POA: Diagnosis not present

## 2016-06-04 DIAGNOSIS — IMO0002 Reserved for concepts with insufficient information to code with codable children: Secondary | ICD-10-CM

## 2016-06-04 LAB — POCT GLYCOSYLATED HEMOGLOBIN (HGB A1C): HEMOGLOBIN A1C: 8.1

## 2016-06-04 NOTE — Progress Notes (Signed)
Patient presents to clinic today to establish care. He is a pleasant 75 year old male who  has a past medical history of Arthritis; CAD in native artery; Cellulitis; Dermatitis; Diabetes mellitus; Edema; History of kidney stones; colonic polyps; Hyperlipidemia; Hypertension; Obesity; OSA (obstructive sleep apnea); Positional vertigo; Rhinosinusitis; and Skin lesion.  His last physical was in January 2017  With MD  Shawna Orleans.   Acute Concerns: Establish Care   Dyspnea - He reports that he has been experiencing dyspnea on exertion from time to time. He has not found that one specific activity produces the shortness of breath. He denies any chest pain with the dyspnea  Chronic Issues: Diabetes Mellitus - His last A1c was 7.3, His current regimen is Starlix, Lantus 30-35 units and Victoza. He has not been following a diabetic diet nor has he been exercising. He works at [employer] is continuously eating .  Hypertension  - Takes Amlodipine 2.5 mg - Feels as though this is well controlled.   Hyperlipidemia  - Has severely elevated triglycerides. Currently taking Zocor 20 mg   Health Maintenance: Dental -- Does not do routine care Vision -- Unsure of the last time he had one  Immunizations -- Needs Influenza and Pneumonia  Colonoscopy -- 2017 Diet: Does not follow a diabetic diet Exercise: Does not exercise   Is followed by  - Urology  - Cardiology     Past Medical History:  Diagnosis Date  . Arthritis   . CAD in native artery   . Cellulitis   . Dermatitis   . Diabetes mellitus    type II  . Edema   . History of kidney stones   . Hx of colonic polyps   . Hyperlipidemia   . Hypertension   . Obesity   . OSA (obstructive sleep apnea)    cpap  . Positional vertigo   . Rhinosinusitis   . Skin lesion     Past Surgical History:  Procedure Laterality Date  . CARDIAC CATHETERIZATION    . COLONOSCOPY    . CORONARY STENT PLACEMENT     3 stents /1997  . POLYPECTOMY       Current Outpatient Prescriptions on File Prior to Visit  Medication Sig Dispense Refill  . amLODipine (NORVASC) 2.5 MG tablet TAKE 1 TABLET BY MOUTH DAILY 90 tablet 0  . aspirin 81 MG tablet Take 81 mg by mouth daily.      . BD PEN NEEDLE NANO U/F 32G X 4 MM MISC USE AS DIRECTED TWICE A DAY 200 each 0  . fluticasone (FLONASE) 50 MCG/ACT nasal spray Place 2 sprays into both nostrils daily as needed. 16 g 3  . LANTUS SOLOSTAR 100 UNIT/ML Solostar Pen INJECT 30 TO 35 UNITS AT 10PM AS DIRECTED 15 pen 3  . nateglinide (STARLIX) 120 MG tablet TAKE 1/2 TO ONE TABLET AS DIRECTED BEFORE AM AND EVENING MEAL 180 tablet 0  . simvastatin (ZOCOR) 20 MG tablet TAKE 1 TABLET (20 MG TOTAL) BY MOUTH AT BEDTIME. 90 tablet 0  . tamsulosin (FLOMAX) 0.4 MG CAPS capsule Take 0.4 mg by mouth daily.  11  . TOVIAZ 8 MG TB24 tablet Take 1 tablet by mouth daily.    Marland Kitchen VICTOZA 18 MG/3ML SOPN INJECT 1.2MG  UNDER THE SKIN EVERY DAY AS DIRECTED 6 pen 0   No current facility-administered medications on file prior to visit.     Allergies  Allergen Reactions  . Lipitor [Atorvastatin Calcium] Other (See Comments)  Muscle soreness    Family History  Problem Relation Age of Onset  . Cancer Mother     ? lung cancer  . Heart disease Father   . Heart attack Father   . Heart disease Sister   . Diabetes Sister   . Heart attack Sister   . Heart attack Brother     Social History   Social History  . Marital status: Married    Spouse name: N/A  . Number of children: N/A  . Years of education: N/A   Occupational History  . OWNER, [employer] History Main Topics  . Smoking status: Former Smoker    Quit date: 01/10/1996  . Smokeless tobacco: Never Used  . Alcohol use No  . Drug use: No  . Sexual activity: Not on file   Other Topics Concern  . Not on file   Social History Narrative   Grew up in [place]. Mother was [place], Father [place].   He works daily - owns a  [employer]    Married for 30+ years   Has a daughter who lives in [place]  Constitutional: Negative.   HENT: Negative.   Eyes: Negative.   Respiratory: Positive for shortness of breath. Negative for cough, hemoptysis, sputum production and wheezing.   Cardiovascular: Negative.   Gastrointestinal: Negative.   Genitourinary: Negative.   Musculoskeletal: Positive for joint pain (bilateral knee).  Skin: Negative.   Neurological: Negative.   Endo/Heme/Allergies: Negative.   Psychiatric/Behavioral: Negative.   All other systems reviewed and are negative.   BP (!) 160/64   Temp 98.7 F (37.1 C) (Oral)   Ht 5\' 11"  (1.803 m)   Wt 295 lb 8 oz (134 kg)   BMI 41.21 kg/m   Physical Exam  Constitutional: He is oriented to person, place, and time and well-developed, well-nourished, and in no distress. No distress.  Morbidly obese  HENT:  Head: Normocephalic and atraumatic.  Right Ear: External ear normal.  Left Ear: External ear normal.  Nose: Nose normal.  Mouth/Throat: Oropharynx is clear and moist. No oropharyngeal exudate.  Eyes: Conjunctivae and EOM are normal. Pupils are equal, round, and reactive to light. Right eye exhibits no discharge. Left eye exhibits no discharge. No scleral icterus.  Neck: Normal range of motion. Neck supple. No thyromegaly present.  Cardiovascular: Normal rate, regular rhythm, normal heart sounds and intact distal pulses.  Exam reveals no gallop.   No murmur heard. Pulmonary/Chest: Effort normal and breath sounds normal. No respiratory distress. He has no wheezes. He has no rales. He exhibits no tenderness.  Abdominal: Soft. Bowel sounds are normal. He exhibits no distension and no mass. There is no tenderness. There is no rebound and no guarding.  Musculoskeletal: Normal range of motion. He exhibits no edema, tenderness or deformity.  Lymphadenopathy:    He has no cervical adenopathy.  Neurological: He  is alert and oriented to person, place, and time. He has normal reflexes. He displays normal reflexes. No cranial nerve deficit. He exhibits normal muscle tone. Gait normal. Coordination normal. GCS score is 15.  Skin: Skin is warm and dry. No rash noted. He is not diaphoretic. No erythema. No pallor.  Psychiatric: Mood, memory, affect and judgment normal.  Nursing note and vitals reviewed.   Recent Results (from the past 2160 hour(s))  POC HgB A1c     Status: None   Collection Time: 06/04/16  4:15 PM  Result Value Ref Range   Hemoglobin A1C 8.1     Assessment/Plan: 1. Uncontrolled type 2 diabetes mellitus with diabetic dermatitis, with long-term current use of insulin (HCC) - POC HgB A1c - 8.1. This ahs increased from 7.3  - Educated on the importance of following a diabetic diet, exercising and taking medications daily as prescribed.  - Start monitoring blood sugars at home.   2. Hyperlipidemia, unspecified hyperlipidemia type - educated on the importance of diet and exercise. He needs to decrease his intake of fatty foods and start exercising. He has a significant family history of MI and heart disease.  - Lipid panel; Future - Likely increase Statin and add agent.   3. Dyspnea on exertion - Likely from deconditioning but do to cardiac history I would like him to be seen by cardiology  - Ambulatory referral to Cardiology  4. Need for prophylactic vaccination and inoculation against influenza  - Flu Vaccine QUAD 36+ mos PF IM (Fluarix & Fluzone Quad PF)  5. Need for vaccination with 13-polyvalent pneumococcal conjugate vaccine  - Pneumococcal conjugate vaccine 13-valent IM   [name], NP

## 2016-06-04 NOTE — Patient Instructions (Signed)
It was great seeing you today.   Someone from cardiology will call you to set up your appointment  Follow up on Friday at [facility] for your lab draw   Please start dieting and exercising.   Your A1c is up form 7.3 to 81.   Follow up with me in 3 months for your physical

## 2016-06-09 ENCOUNTER — Telehealth: Payer: Self-pay | Admitting: Adult Health

## 2016-06-09 ENCOUNTER — Encounter: Payer: Self-pay | Admitting: Cardiology

## 2016-06-09 ENCOUNTER — Other Ambulatory Visit: Payer: Medicare Other

## 2016-06-09 NOTE — Telephone Encounter (Signed)
Pt would like to have his lipid drawn at [facility] not [facility]. Please put order in system

## 2016-06-10 ENCOUNTER — Other Ambulatory Visit: Payer: Self-pay | Admitting: Adult Health

## 2016-06-10 DIAGNOSIS — N3941 Urge incontinence: Secondary | ICD-10-CM | POA: Diagnosis not present

## 2016-06-10 NOTE — Telephone Encounter (Signed)
See below

## 2016-06-10 NOTE — Telephone Encounter (Signed)
He already has an order for this placed

## 2016-06-11 ENCOUNTER — Ambulatory Visit (INDEPENDENT_AMBULATORY_CARE_PROVIDER_SITE_OTHER): Payer: Medicare Other | Admitting: Cardiology

## 2016-06-11 ENCOUNTER — Encounter: Payer: Self-pay | Admitting: Cardiology

## 2016-06-11 VITALS — BP 120/72 | HR 85 | Ht 72.0 in | Wt 294.4 lb

## 2016-06-11 DIAGNOSIS — I2510 Atherosclerotic heart disease of native coronary artery without angina pectoris: Secondary | ICD-10-CM

## 2016-06-11 NOTE — Telephone Encounter (Signed)
Pt is aware.  

## 2016-06-11 NOTE — Patient Instructions (Signed)
Medication Instructions:  Your physician recommends that you continue on your current medications as directed. Please refer to the Current Medication list given to you today.  * If you need a refill on your cardiac medications before your next appointment, please call your pharmacy.   Labwork: None ordered  Testing/Procedures: None ordered  Follow-Up: Your physician wants you to follow-up in: 1 year with Dr. Camnitz.  You will receive a reminder letter in the mail two months in advance. If you don't receive a letter, please call our office to schedule the follow-up appointment.  Thank you for choosing [facility]!!   Jermar Colter, RN [phone]        

## 2016-06-11 NOTE — Progress Notes (Signed)
Electrophysiology Office Note   Date:  06/11/2016   ID:  Jacob, Bishop [birth year], MRN [id number]  PCP:  [name], NP  Cardiologist:   Constance Haw, Jacob Bishop    Chief Complaint  Patient presents with  . New Patient (Initial Visit)    DOE     History of Present Illness: Jacob Bishop is a 75 y.o. male who presents today for electrophysiology evaluation.   History of coronary artery disease, diabetes, hypertension, hyperlipidemia, OSA, and obesity. He is coming in to clinic today to reestablish care. He has a history of hypertension and coronary disease, and says that he had coronary stenting in the late 90s to early 2000's. He has 2 stents in place. He does not have chest pain but does have mild shortness of breath. He does say that he can walk on flat ground without issue and climb the stairs in his house without problems. He does have sleep apnea and wears a CPAP, though he does say that he would be able to lie flat in bed without issues.   Today, he denies symptoms of palpitations, chest pain, shortness of breath, orthopnea, PND, lower extremity edema, claudication, dizziness, presyncope, syncope, bleeding, or neurologic sequela. The patient is tolerating medications without difficulties and is otherwise without complaint today.    Past Medical History:  Diagnosis Date  . Arthritis   . CAD in native artery   . Cellulitis   . Dermatitis   . Diabetes mellitus    type II  . Edema   . History of kidney stones   . Hx of colonic polyps   . Hyperlipidemia   . Hypertension   . Obesity   . OSA (obstructive sleep apnea)    cpap  . Positional vertigo   . Rhinosinusitis   . Skin lesion    Past Surgical History:  Procedure Laterality Date  . CARDIAC CATHETERIZATION    . COLONOSCOPY    . CORONARY STENT PLACEMENT     3 stents /1997  . POLYPECTOMY       Current Outpatient Prescriptions  Medication Sig Dispense Refill  . amLODipine (NORVASC) 2.5 MG tablet  TAKE 1 TABLET BY MOUTH DAILY 90 tablet 0  . aspirin 81 MG tablet Take 81 mg by mouth daily.      . BD PEN NEEDLE NANO U/F 32G X 4 MM MISC USE AS DIRECTED TWICE A DAY 200 each 0  . fluticasone (FLONASE) 50 MCG/ACT nasal spray Place 2 sprays into both nostrils daily as needed. 16 g 3  . LANTUS SOLOSTAR 100 UNIT/ML Solostar Pen INJECT 30 TO 35 UNITS AT 10PM AS DIRECTED 15 pen 3  . nateglinide (STARLIX) 120 MG tablet TAKE 1/2 TO ONE TABLET AS DIRECTED BEFORE AM AND EVENING MEAL 180 tablet 0  . simvastatin (ZOCOR) 20 MG tablet TAKE 1 TABLET (20 MG TOTAL) BY MOUTH AT BEDTIME. 90 tablet 0  . tamsulosin (FLOMAX) 0.4 MG CAPS capsule Take 0.4 mg by mouth daily.  11  . TOVIAZ 8 MG TB24 tablet Take 1 tablet by mouth daily.    Marland Kitchen VICTOZA 18 MG/3ML SOPN INJECT 1.2MG  UNDER THE SKIN EVERY DAY AS DIRECTED 6 pen 0   No current facility-administered medications for this visit.     Allergies:   Lipitor [atorvastatin calcium]   Social History:  The patient  reports that he quit smoking about 20 years ago. He has never used smokeless tobacco. He reports that he does not drink alcohol  or use drugs.   Family History:  The patient's family history includes Cancer in his mother; Diabetes in his sister; Heart attack in his brother, father, and sister; Heart disease in his father and sister.    ROS:  Please see the history of present illness.   Otherwise, review of systems is positive for fatigue, DOE, balance problems, easy bruising.   All other systems are reviewed and negative.    PHYSICAL EXAM: VS:  BP 120/72   Pulse 85   Ht 6' (1.829 m)   Wt 294 lb 6.4 oz (133.5 kg)   BMI 39.93 kg/m  , BMI Body mass index is 39.93 kg/m. GEN: Well nourished, well developed, in no acute distress  HEENT: normal  Neck: no JVD, carotid bruits, or masses Cardiac: RRR; no murmurs, rubs, or gallops,no edema  Respiratory:  clear to auscultation bilaterally, normal work of breathing GI: soft, nontender, nondistended, + BS MS: no  deformity or atrophy  Skin: warm and dry Neuro:  Strength and sensation are intact Psych: euthymic mood, full affect  EKG:  EKG is ordered today. Personal review of the ekg ordered shows sinus rhythm, rate 85  Recent Labs: 09/05/2015: ALT 56; BUN 18; Creatinine, Ser 1.02; Potassium 4.3; Sodium 140    Lipid Panel     Component Value Date/Time   CHOL 186 09/05/2015 1721   TRIG (H) 09/05/2015 1721    452.0 Triglyceride is over 400; calculations on Lipids are invalid.   HDL 35.30 (L) 09/05/2015 1721   CHOLHDL 5 09/05/2015 1721   VLDL 41.4 (H) 04/27/2014 0755   LDLCALC 80 03/31/2013 0750   LDLDIRECT 86.0 09/05/2015 1721     Wt Readings from Last 3 Encounters:  06/11/16 294 lb 6.4 oz (133.5 kg)  06/04/16 295 lb 8 oz (134 kg)  03/04/16 293 lb 14.4 oz (133.3 kg)      Other studies Reviewed: Additional studies/ records that were reviewed today include: PCP notes   ASSESSMENT AND PLAN:  1.  Dyspnea on exertion: Mild and long-standing. It is likely due to deconditioning and being overweight.  2. Hypertension: Well controlled today no medication changes  3. Coronary artery disease: Currently no chest pain on a statin, aspirin. He is getting his cholesterol checked tomorrow. He has a goal LDL of 70, and therefore would likely require either a change to Lipitor or Crestor or an increase in his Zocor dosage.  4. Obstructive sleep apnea: Compliant with CPAP    Current medicines are reviewed at length with the patient today.   The patient does not have concerns regarding his medicines.  The following changes were made today:  none  Labs/ tests ordered today include:  Orders Placed This Encounter  Procedures  . EKG 12-Lead     Disposition:   FU with Jacob Bishop 1 year  Signed, Jacob Bishop Meredith Leeds, Jacob Bishop  06/11/2016 3:14 PM     [address] [place]  [id number] [phone] (office) [phone] (fax)

## 2016-06-12 ENCOUNTER — Other Ambulatory Visit (INDEPENDENT_AMBULATORY_CARE_PROVIDER_SITE_OTHER): Payer: Medicare Other

## 2016-06-12 DIAGNOSIS — E785 Hyperlipidemia, unspecified: Secondary | ICD-10-CM

## 2016-06-12 LAB — LIPID PANEL
Cholesterol: 151 mg/dL (ref 0–200)
HDL: 34.7 mg/dL — ABNORMAL LOW (ref >39.00)
LDL Cholesterol: 84 mg/dL (ref 0–99)
NONHDL: 115.98
Total CHOL/HDL Ratio: 4
Triglycerides: 161 mg/dL — ABNORMAL HIGH (ref 0.0–149.0)
VLDL: 32.2 mg/dL (ref 0.0–40.0)

## 2016-06-23 DIAGNOSIS — M25561 Pain in right knee: Secondary | ICD-10-CM | POA: Diagnosis not present

## 2016-06-23 DIAGNOSIS — M25562 Pain in left knee: Secondary | ICD-10-CM | POA: Diagnosis not present

## 2016-06-23 DIAGNOSIS — M170 Bilateral primary osteoarthritis of knee: Secondary | ICD-10-CM | POA: Diagnosis not present

## 2016-06-30 DIAGNOSIS — M170 Bilateral primary osteoarthritis of knee: Secondary | ICD-10-CM | POA: Diagnosis not present

## 2016-06-30 DIAGNOSIS — M25561 Pain in right knee: Secondary | ICD-10-CM | POA: Diagnosis not present

## 2016-06-30 DIAGNOSIS — M25562 Pain in left knee: Secondary | ICD-10-CM | POA: Diagnosis not present

## 2016-07-01 DIAGNOSIS — N3941 Urge incontinence: Secondary | ICD-10-CM | POA: Diagnosis not present

## 2016-07-03 ENCOUNTER — Other Ambulatory Visit: Payer: Self-pay | Admitting: Internal Medicine

## 2016-07-03 ENCOUNTER — Other Ambulatory Visit: Payer: Self-pay | Admitting: Adult Health

## 2016-07-22 DIAGNOSIS — N3941 Urge incontinence: Secondary | ICD-10-CM | POA: Diagnosis not present

## 2016-08-12 DIAGNOSIS — N3941 Urge incontinence: Secondary | ICD-10-CM | POA: Diagnosis not present

## 2016-08-14 ENCOUNTER — Other Ambulatory Visit: Payer: Self-pay | Admitting: Adult Health

## 2016-09-02 ENCOUNTER — Telehealth: Payer: Self-pay | Admitting: Adult Health

## 2016-09-02 ENCOUNTER — Other Ambulatory Visit: Payer: Self-pay

## 2016-09-02 MED ORDER — BASAGLAR KWIKPEN 100 UNIT/ML ~~LOC~~ SOPN: PEN_INJECTOR | SUBCUTANEOUS | 3 refills | Status: DC

## 2016-09-02 NOTE — Telephone Encounter (Signed)
Pt would like need new Rx for basaglar    Pharm:  [facility] on [address] state that [employer] will not pay for Lantus any longer and suggest that he try basaglar which would be a little less expensive.

## 2016-09-02 NOTE — Telephone Encounter (Signed)
That is fine. Can fill for one year.   Use the same units of basiglar as he is with lantus

## 2016-09-02 NOTE — Telephone Encounter (Signed)
Is this ok?

## 2016-09-02 NOTE — Telephone Encounter (Signed)
Rx has been sent in as directed.  

## 2016-09-04 ENCOUNTER — Encounter: Payer: Medicare Other | Admitting: Adult Health

## 2016-09-04 ENCOUNTER — Ambulatory Visit (INDEPENDENT_AMBULATORY_CARE_PROVIDER_SITE_OTHER): Payer: Medicare Other | Admitting: Adult Health

## 2016-09-04 VITALS — BP 110/64 | Temp 98.0°F | Ht 72.0 in | Wt 293.6 lb

## 2016-09-04 DIAGNOSIS — Z794 Long term (current) use of insulin: Secondary | ICD-10-CM

## 2016-09-04 DIAGNOSIS — I10 Essential (primary) hypertension: Secondary | ICD-10-CM | POA: Diagnosis not present

## 2016-09-04 DIAGNOSIS — R319 Hematuria, unspecified: Secondary | ICD-10-CM | POA: Diagnosis not present

## 2016-09-04 DIAGNOSIS — E11620 Type 2 diabetes mellitus with diabetic dermatitis: Secondary | ICD-10-CM | POA: Diagnosis not present

## 2016-09-04 DIAGNOSIS — E1165 Type 2 diabetes mellitus with hyperglycemia: Secondary | ICD-10-CM

## 2016-09-04 DIAGNOSIS — IMO0002 Reserved for concepts with insufficient information to code with codable children: Secondary | ICD-10-CM

## 2016-09-04 DIAGNOSIS — E785 Hyperlipidemia, unspecified: Secondary | ICD-10-CM | POA: Diagnosis not present

## 2016-09-04 DIAGNOSIS — H8113 Benign paroxysmal vertigo, bilateral: Secondary | ICD-10-CM

## 2016-09-04 LAB — POC URINALSYSI DIPSTICK (AUTOMATED)
Bilirubin, UA: NEGATIVE
Ketones, UA: NEGATIVE
Leukocytes, UA: NEGATIVE
Nitrite, UA: NEGATIVE
Protein, UA: NEGATIVE
Spec Grav, UA: 1.025
Urobilinogen, UA: 0.2
pH, UA: 5

## 2016-09-04 MED ORDER — MECLIZINE HCL 12.5 MG PO TABS: 12.5000 mg | ORAL_TABLET | Freq: Three times a day (TID) | ORAL | 1 refills | Status: DC | PRN

## 2016-09-04 MED ORDER — FLUTICASONE PROPIONATE 50 MCG/ACT NA SUSP: 2.0000 | Freq: Every day | NASAL | 3 refills | Status: DC | PRN

## 2016-09-04 NOTE — Progress Notes (Signed)
Subjective:    Patient ID: Jacob Bishop, male    DOB: 02-19-1941, 76 y.o.   MRN: [id number]  HPI  Patient presents for yearly follow up exam due to history of diabetes mellitus, hyperlipidemia, hypertension, and obesity.  All immunizations and health maintenance protocols were reviewed with the patient and needed orders were placed.  Appropriate screening laboratory values were ordered for the patient including screening of hyperlipidemia, renal function and hepatic function. If indicated by BPH, a PSA was ordered.  Medication reconciliation,  past medical history, social history, problem list and allergies were reviewed in detail with the patient  Goals were established with regard to weight loss, exercise, and  diet in compliance with medications  End of life planning was discussed.  He is up to date on his colonoscopy. He is not up to date on vision and dental screen   He is taking Zocor 20 mg due to history of hyperlipidemia  As taking Victoza, Starlix, and basalglar due to history of diabetes mellitus type 2. He is not checking his blood sugars at home but has started taking his medications as directed. He reports that the holiday season was hard for him in controlling his diet. He is afraid that his A1c will be elevated  Overall he states "I feel really good". His only acute complaint today is that of vertigo. He reports that since the weather has been changing he's been experiencing more frequent vertigo-like symptoms. His symptoms are more apparent when getting out of bed in the morning as well as looking left or right very quickly. He has taken meclizine in the past and has done well on this medication   Review of Systems  Constitutional: Negative.   HENT: Negative.   Eyes: Negative.   Respiratory: Negative.   Cardiovascular: Negative.   Gastrointestinal: Negative.   Endocrine: Negative.   Genitourinary: Negative.   Musculoskeletal: Negative.   Skin: Negative.     Allergic/Immunologic: Negative.   Neurological: Positive for dizziness.  Hematological: Negative.   Psychiatric/Behavioral: Negative.   All other systems reviewed and are negative.  Past Medical History:  Diagnosis Date  . Arthritis   . CAD in native artery   . Cellulitis   . Dermatitis   . Diabetes mellitus    type II  . Edema   . History of kidney stones   . Hx of colonic polyps   . Hyperlipidemia   . Hypertension   . Obesity   . OSA (obstructive sleep apnea)    cpap  . Positional vertigo   . Rhinosinusitis   . Skin lesion     Social History   Social History  . Marital status: Married    Spouse name: N/A  . Number of children: N/A  . Years of education: N/A   Occupational History  . OWNER, [employer] History Main Topics  . Smoking status: Former Smoker    Quit date: 01/10/1996  . Smokeless tobacco: Never Used  . Alcohol use No  . Drug use: No  . Sexual activity: Not on file   Other Topics Concern  . Not on file   Social History Narrative   Grew up in [place]. Mother was [place], Father [place].   He works daily - owns a [employer]    Married for 30+ years   Has a daughter who lives in [place]     Past Surgical History:  Procedure Laterality Date  .  CARDIAC CATHETERIZATION    . COLONOSCOPY    . CORONARY STENT PLACEMENT     3 stents /1997  . POLYPECTOMY      Family History  Problem Relation Age of Onset  . Cancer Mother     ? lung cancer  . Heart disease Father   . Heart attack Father   . Heart disease Sister   . Diabetes Sister   . Heart attack Sister   . Heart attack Brother     Allergies  Allergen Reactions  . Lipitor [Atorvastatin Calcium] Other (See Comments)    Muscle soreness    Current Outpatient Prescriptions on File Prior to Visit  Medication Sig Dispense Refill  . amLODipine (NORVASC) 2.5 MG tablet TAKE 1 TABLET BY MOUTH DAILY 90 tablet 1  . aspirin 81 MG  tablet Take 81 mg by mouth daily.      . BD PEN NEEDLE NANO U/F 32G X 4 MM MISC USE AS DIRECTED TWICE A DAY 200 each 0  . Insulin Glargine (BASAGLAR KWIKPEN) 100 UNIT/ML SOPN Inject 30 to 35 units at 10pm as directed. 3 mL 3  . nateglinide (STARLIX) 120 MG tablet TAKE 1/2 TO ONE TABLET AS DIRECTED BEFORE AM AND EVENING MEAL 180 tablet 0  . simvastatin (ZOCOR) 20 MG tablet TAKE 1 TABLET BY MOUTH EVERY DAY AT BEDTIME 90 tablet 0  . tamsulosin (FLOMAX) 0.4 MG CAPS capsule Take 0.4 mg by mouth daily.  11  . TOVIAZ 8 MG TB24 tablet Take 1 tablet by mouth daily.    Marland Kitchen VICTOZA 18 MG/3ML SOPN INJECT 1.2MG  UNDER THE SKIN EVERY DAY AS DIRECTED 6 pen 0   No current facility-administered medications on file prior to visit.     BP 110/64   Temp 98 F (36.7 C) (Oral)   Ht 6' (1.829 m)   Wt 293 lb 9.6 oz (133.2 kg)   BMI 39.82 kg/m        Objective:   Physical Exam  Constitutional: He is oriented to person, place, and time. He appears well-developed and well-nourished. No distress.  obese  HENT:  Head: Normocephalic and atraumatic.  Right Ear: Hearing, external ear and ear canal normal. Tympanic membrane is bulging. Tympanic membrane is not erythematous.  Left Ear: Hearing and external ear normal. Tympanic membrane is bulging. Tympanic membrane is not erythematous.  Mouth/Throat: Oropharynx is clear and moist. No oropharyngeal exudate.  Eyes: Conjunctivae are normal. Pupils are equal, round, and reactive to light. Right eye exhibits no discharge. Left eye exhibits no discharge. No scleral icterus. Right eye exhibits nystagmus (Horizontal). Left eye exhibits nystagmus (Horizontal).  Neck: Normal range of motion. Neck supple. No JVD present. Carotid bruit is not present. No tracheal deviation present. No thyroid mass and no thyromegaly present.  Cardiovascular: Normal rate, regular rhythm, normal heart sounds and intact distal pulses.  Exam reveals no gallop and no friction rub.   No murmur  heard. Pulmonary/Chest: Effort normal and breath sounds normal. No stridor. No respiratory distress. He has no wheezes. He has no rales. He exhibits no tenderness.  Abdominal: Soft. Bowel sounds are normal. He exhibits no distension and no mass. There is no tenderness. There is no rebound and no guarding.  Genitourinary:  Genitourinary Comments: Done by urology   Musculoskeletal: Normal range of motion. He exhibits no edema, tenderness or deformity.  Lymphadenopathy:    He has no cervical adenopathy.  Neurological: He is alert and oriented to person, place, and time. He has normal  reflexes. He displays normal reflexes. No cranial nerve deficit. He exhibits normal muscle tone. Coordination normal.  Skin: Skin is warm and dry. No rash noted. He is not diaphoretic. No erythema. No pallor.  Psychiatric: He has a normal mood and affect. His behavior is normal. Judgment and thought content normal.  Nursing note and vitals reviewed.     Assessment & Plan:  1. Hyperlipidemia, unspecified hyperlipidemia type - He had lipid panel done approximately 2 months ago which time his total cholesterol panel was within normal limits, and he had made considerable strides in his triglyceride level going from 452-161. Keep him on Zocor 20 mg  - Basic metabolic panel - CBC with Differential/Platelet - Hemoglobin A1c - Hepatic function panel - TSH - POCT Urinalysis Dipstick (Automated)  2. Essential hypertension - Well-controlled at this time. No change in medication - Basic metabolic panel - CBC with Differential/Platelet - Hemoglobin A1c - Hepatic function panel - TSH - POCT Urinalysis Dipstick (Automated) - Urine Microscopic  3. Uncontrolled type 2 diabetes mellitus with diabetic dermatitis, with long-term current use of insulin (HCC)  - Basic metabolic panel - CBC with Differential/Platelet - Hemoglobin A1c - Hepatic function panel - TSH - POCT Urinalysis Dipstick (Automated) - Urine  Microscopic - Consider increasing lung acting insulin - Follow-up in 3 months 4. Hematuria, unspecified type  - Urine Microscopic  5. Benign paroxysmal positional vertigo due to bilateral vestibular disorder  - meclizine (ANTIVERT) 12.5 MG tablet; Take 1 tablet (12.5 mg total) by mouth 3 (three) times daily as needed for dizziness.  Dispense: 30 tablet; Refill: 1 - fluticasone (FLONASE) 50 MCG/ACT nasal spray; Place 2 sprays into both nostrils daily as needed.  Dispense: 16 g; Refill: 3  [name], NP

## 2016-09-05 LAB — CBC WITH DIFFERENTIAL/PLATELET
Basophils Absolute: 0 10*3/uL (ref 0.0–0.1)
Basophils Relative: 0.6 % (ref 0.0–3.0)
EOS ABS: 0.1 10*3/uL (ref 0.0–0.7)
EOS PCT: 2.6 % (ref 0.0–5.0)
HEMATOCRIT: 42.1 % (ref 39.0–52.0)
HEMOGLOBIN: 14.6 g/dL (ref 13.0–17.0)
LYMPHS PCT: 40.9 % (ref 12.0–46.0)
Lymphs Abs: 2.3 10*3/uL (ref 0.7–4.0)
MCHC: 34.8 g/dL (ref 30.0–36.0)
MCV: 93.2 fl (ref 78.0–100.0)
MONO ABS: 0.6 10*3/uL (ref 0.1–1.0)
Monocytes Relative: 9.9 % (ref 3.0–12.0)
Neutro Abs: 2.6 10*3/uL (ref 1.4–7.7)
Neutrophils Relative %: 46 % (ref 43.0–77.0)
Platelets: 110 10*3/uL — ABNORMAL LOW (ref 150.0–400.0)
RBC: 4.52 Mil/uL (ref 4.22–5.81)
RDW: 13.6 % (ref 11.5–15.5)
WBC: 5.7 10*3/uL (ref 4.0–10.5)

## 2016-09-05 LAB — BASIC METABOLIC PANEL
BUN: 23 mg/dL (ref 6–23)
CO2: 29 mEq/L (ref 19–32)
Calcium: 9.3 mg/dL (ref 8.4–10.5)
Chloride: 103 mEq/L (ref 96–112)
Creatinine, Ser: 1.01 mg/dL (ref 0.40–1.50)
GFR: 76.94 mL/min (ref >60.00)
Glucose, Bld: 209 mg/dL — ABNORMAL HIGH (ref 70–99)
POTASSIUM: 4.3 meq/L (ref 3.5–5.1)
SODIUM: 139 meq/L (ref 135–145)

## 2016-09-05 LAB — URINALYSIS, MICROSCOPIC ONLY

## 2016-09-05 LAB — HEPATIC FUNCTION PANEL
ALT: 72 U/L — ABNORMAL HIGH (ref 0–53)
AST: 49 U/L — ABNORMAL HIGH (ref 0–37)
Albumin: 4.1 g/dL (ref 3.5–5.2)
Alkaline Phosphatase: 70 U/L (ref 39–117)
BILIRUBIN DIRECT: 0.1 mg/dL (ref 0.0–0.3)
BILIRUBIN TOTAL: 0.7 mg/dL (ref 0.2–1.2)
TOTAL PROTEIN: 6.8 g/dL (ref 6.0–8.3)

## 2016-09-05 LAB — TSH: TSH: 1.78 u[IU]/mL (ref 0.35–4.50)

## 2016-09-05 LAB — HEMOGLOBIN A1C: Hgb A1c MFr Bld: 8.8 % — ABNORMAL HIGH (ref 4.6–6.5)

## 2016-09-23 ENCOUNTER — Encounter: Payer: Self-pay | Admitting: Adult Health

## 2016-09-23 ENCOUNTER — Ambulatory Visit (INDEPENDENT_AMBULATORY_CARE_PROVIDER_SITE_OTHER): Payer: Medicare Other | Admitting: Adult Health

## 2016-09-23 VITALS — BP 146/64 | HR 79 | Temp 97.9°F | Wt 298.8 lb

## 2016-09-23 DIAGNOSIS — J399 Disease of upper respiratory tract, unspecified: Secondary | ICD-10-CM | POA: Diagnosis not present

## 2016-09-23 MED ORDER — IPRATROPIUM-ALBUTEROL 0.5-2.5 (3) MG/3ML IN SOLN: 3.0000 mL | Freq: Once | RESPIRATORY_TRACT | Status: DC

## 2016-09-23 MED ORDER — DOXYCYCLINE HYCLATE 100 MG PO CAPS: 100.0000 mg | ORAL_CAPSULE | Freq: Two times a day (BID) | ORAL | 0 refills | Status: DC

## 2016-09-23 MED ORDER — ALBUTEROL SULFATE HFA 108 (90 BASE) MCG/ACT IN AERS: 2.0000 | INHALATION_SPRAY | Freq: Four times a day (QID) | RESPIRATORY_TRACT | 0 refills | Status: DC | PRN

## 2016-09-23 NOTE — Progress Notes (Signed)
Subjective:    Patient ID: Jacob Bishop, male    DOB: 14-Aug-1941, 76 y.o.   MRN: [id number]  URI   This is a new problem. The current episode started yesterday (2-3 days ). The problem has been gradually worsening. The maximum temperature recorded prior to his arrival was 100.4 - 100.9 F (subjective fever). Associated symptoms include congestion, coughing (productive ), headaches, rhinorrhea, sinus pain, a sore throat and wheezing. Pertinent negatives include no chest pain, diarrhea, nausea or plugged ear sensation.      Review of Systems  Constitutional: Positive for activity change, fatigue and fever. Negative for chills and diaphoresis.  HENT: Positive for congestion, rhinorrhea, sinus pain, sinus pressure and sore throat.   Respiratory: Positive for cough (productive ), shortness of breath and wheezing. Negative for chest tightness.   Cardiovascular: Negative for chest pain.  Gastrointestinal: Negative for diarrhea and nausea.  Musculoskeletal: Positive for myalgias.  Neurological: Positive for headaches.   Past Medical History:  Diagnosis Date  . Arthritis   . CAD in native artery   . Cellulitis   . Dermatitis   . Diabetes mellitus    type II  . Edema   . History of kidney stones   . Hx of colonic polyps   . Hyperlipidemia   . Hypertension   . Obesity   . OSA (obstructive sleep apnea)    cpap  . Positional vertigo   . Rhinosinusitis   . Skin lesion     Social History   Social History  . Marital status: Married    Spouse name: N/A  . Number of children: N/A  . Years of education: N/A   Occupational History  . OWNER, [employer] History Main Topics  . Smoking status: Former Smoker    Quit date: 01/10/1996  . Smokeless tobacco: Never Used  . Alcohol use No  . Drug use: No  . Sexual activity: Not on file   Other Topics Concern  . Not on file   Social History Narrative   Grew up in [place]. Mother was [place],  Father [place].   He works daily - owns a [employer]    Married for 30+ years   Has a daughter who lives in [place]     Past Surgical History:  Procedure Laterality Date  . CARDIAC CATHETERIZATION    . COLONOSCOPY    . CORONARY STENT PLACEMENT     3 stents /1997  . POLYPECTOMY      Family History  Problem Relation Age of Onset  . Cancer Mother     ? lung cancer  . Heart disease Father   . Heart attack Father   . Heart disease Sister   . Diabetes Sister   . Heart attack Sister   . Heart attack Brother     Allergies  Allergen Reactions  . Lipitor [Atorvastatin Calcium] Other (See Comments)    Muscle soreness    Current Outpatient Prescriptions on File Prior to Visit  Medication Sig Dispense Refill  . amLODipine (NORVASC) 2.5 MG tablet TAKE 1 TABLET BY MOUTH DAILY 90 tablet 1  . aspirin 81 MG tablet Take 81 mg by mouth daily.      . BD PEN NEEDLE NANO U/F 32G X 4 MM MISC USE AS DIRECTED TWICE A DAY 200 each 0  . fluticasone (FLONASE) 50 MCG/ACT nasal spray Place 2 sprays into both nostrils daily as needed. 16 g 3  .  Insulin Glargine (BASAGLAR KWIKPEN) 100 UNIT/ML SOPN Inject 30 to 35 units at 10pm as directed. 3 mL 3  . meclizine (ANTIVERT) 12.5 MG tablet Take 1 tablet (12.5 mg total) by mouth 3 (three) times daily as needed for dizziness. 30 tablet 1  . nateglinide (STARLIX) 120 MG tablet TAKE 1/2 TO ONE TABLET AS DIRECTED BEFORE AM AND EVENING MEAL 180 tablet 0  . simvastatin (ZOCOR) 20 MG tablet TAKE 1 TABLET BY MOUTH EVERY DAY AT BEDTIME 90 tablet 0  . tamsulosin (FLOMAX) 0.4 MG CAPS capsule Take 0.4 mg by mouth daily.  11  . TOVIAZ 8 MG TB24 tablet Take 1 tablet by mouth daily.    Marland Kitchen VICTOZA 18 MG/3ML SOPN INJECT 1.2MG  UNDER THE SKIN EVERY DAY AS DIRECTED 6 pen 0   No current facility-administered medications on file prior to visit.     BP (!) 146/64 (BP Location: Left Arm, Patient Position: Sitting, Cuff Size: Normal)   Pulse 79   Temp  97.9 F (36.6 C) (Oral)   Wt 298 lb 12.8 oz (135.5 kg)   SpO2 95%   BMI 40.52 kg/m       Objective:   Physical Exam  Constitutional: He is oriented to person, place, and time. He appears well-developed and well-nourished. No distress.  HENT:  Head: Normocephalic and atraumatic.  Right Ear: Hearing, tympanic membrane, external ear and ear canal normal.  Left Ear: Hearing, tympanic membrane, external ear and ear canal normal.  Nose: Mucosal edema and rhinorrhea present. Right sinus exhibits frontal sinus tenderness. Right sinus exhibits no maxillary sinus tenderness. Left sinus exhibits frontal sinus tenderness.  Mouth/Throat: Uvula is midline and oropharynx is clear and moist. No oropharyngeal exudate.  Eyes: Conjunctivae and EOM are normal. Pupils are equal, round, and reactive to light. Right eye exhibits no discharge. Left eye exhibits no discharge. No scleral icterus.  Neck: Normal range of motion. Neck supple.  Cardiovascular: Normal rate, regular rhythm, normal heart sounds and intact distal pulses.  Exam reveals no gallop and no friction rub.   No murmur heard. Pulmonary/Chest: Effort normal and breath sounds normal. No respiratory distress. He has no wheezes (trace wheezes in upper lung fields ). He has no rales. He exhibits no tenderness.  Neurological: He is alert and oriented to person, place, and time.  Skin: Skin is warm and dry. No rash noted. He is not diaphoretic. No erythema.  Psychiatric: He has a normal mood and affect. His behavior is normal. Judgment and thought content normal.  Nursing note and vitals reviewed.     Assessment & Plan:  1. Upper respiratory disease - doxycycline (VIBRAMYCIN) 100 MG capsule; Take 1 capsule (100 mg total) by mouth 2 (two) times daily.  Dispense: 14 capsule; Refill: 0 - albuterol (PROVENTIL HFA;VENTOLIN HFA) 108 (90 Base) MCG/ACT inhaler; Inhale 2 puffs into the lungs every 6 (six) hours as needed for wheezing or shortness of breath.   Dispense: 1 Inhaler; Refill: 0 - ipratropium-albuterol (DUONEB) 0.5-2.5 (3) MG/3ML nebulizer solution 3 mL; Take 3 mLs by nebulization once. - Patient endorsed feeling as though he was less short of breath after duoneb. Wheezing had resolved - Follow up in 2-3 days if no improvement or sooner if symptoms worsen   [name], NP

## 2016-09-24 ENCOUNTER — Ambulatory Visit: Payer: Medicare Other | Admitting: Adult Health

## 2016-10-06 DIAGNOSIS — M25561 Pain in right knee: Secondary | ICD-10-CM | POA: Diagnosis not present

## 2016-10-06 DIAGNOSIS — M170 Bilateral primary osteoarthritis of knee: Secondary | ICD-10-CM | POA: Diagnosis not present

## 2016-10-06 DIAGNOSIS — M25562 Pain in left knee: Secondary | ICD-10-CM | POA: Diagnosis not present

## 2016-10-13 DIAGNOSIS — M25561 Pain in right knee: Secondary | ICD-10-CM | POA: Diagnosis not present

## 2016-10-13 DIAGNOSIS — M1711 Unilateral primary osteoarthritis, right knee: Secondary | ICD-10-CM | POA: Diagnosis not present

## 2016-10-20 DIAGNOSIS — M25562 Pain in left knee: Secondary | ICD-10-CM | POA: Diagnosis not present

## 2016-10-20 DIAGNOSIS — M1712 Unilateral primary osteoarthritis, left knee: Secondary | ICD-10-CM | POA: Diagnosis not present

## 2016-10-23 ENCOUNTER — Other Ambulatory Visit: Payer: Self-pay | Admitting: Adult Health

## 2016-10-23 NOTE — Telephone Encounter (Signed)
Ok to refill for one year  

## 2016-10-27 DIAGNOSIS — M25561 Pain in right knee: Secondary | ICD-10-CM | POA: Diagnosis not present

## 2016-10-27 DIAGNOSIS — M1711 Unilateral primary osteoarthritis, right knee: Secondary | ICD-10-CM | POA: Diagnosis not present

## 2016-11-05 DIAGNOSIS — M25562 Pain in left knee: Secondary | ICD-10-CM | POA: Diagnosis not present

## 2016-11-05 DIAGNOSIS — M1712 Unilateral primary osteoarthritis, left knee: Secondary | ICD-10-CM | POA: Diagnosis not present

## 2016-11-10 DIAGNOSIS — M1711 Unilateral primary osteoarthritis, right knee: Secondary | ICD-10-CM | POA: Diagnosis not present

## 2016-11-10 DIAGNOSIS — M25561 Pain in right knee: Secondary | ICD-10-CM | POA: Diagnosis not present

## 2016-11-17 DIAGNOSIS — M25562 Pain in left knee: Secondary | ICD-10-CM | POA: Diagnosis not present

## 2016-11-17 DIAGNOSIS — M1712 Unilateral primary osteoarthritis, left knee: Secondary | ICD-10-CM | POA: Diagnosis not present

## 2016-11-24 DIAGNOSIS — M25562 Pain in left knee: Secondary | ICD-10-CM | POA: Diagnosis not present

## 2016-11-24 DIAGNOSIS — M170 Bilateral primary osteoarthritis of knee: Secondary | ICD-10-CM | POA: Diagnosis not present

## 2016-11-24 DIAGNOSIS — M25561 Pain in right knee: Secondary | ICD-10-CM | POA: Diagnosis not present

## 2016-12-03 ENCOUNTER — Encounter: Payer: Self-pay | Admitting: Adult Health

## 2016-12-03 ENCOUNTER — Ambulatory Visit (INDEPENDENT_AMBULATORY_CARE_PROVIDER_SITE_OTHER): Payer: Medicare Other | Admitting: Adult Health

## 2016-12-03 VITALS — BP 128/78 | Temp 98.6°F | Ht 72.0 in | Wt 294.6 lb

## 2016-12-03 DIAGNOSIS — E1165 Type 2 diabetes mellitus with hyperglycemia: Secondary | ICD-10-CM

## 2016-12-03 DIAGNOSIS — Z794 Long term (current) use of insulin: Secondary | ICD-10-CM

## 2016-12-03 DIAGNOSIS — G8929 Other chronic pain: Secondary | ICD-10-CM

## 2016-12-03 DIAGNOSIS — M25561 Pain in right knee: Secondary | ICD-10-CM

## 2016-12-03 DIAGNOSIS — IMO0002 Reserved for concepts with insufficient information to code with codable children: Secondary | ICD-10-CM

## 2016-12-03 DIAGNOSIS — M25562 Pain in left knee: Secondary | ICD-10-CM

## 2016-12-03 DIAGNOSIS — E11620 Type 2 diabetes mellitus with diabetic dermatitis: Secondary | ICD-10-CM

## 2016-12-03 LAB — POCT GLYCOSYLATED HEMOGLOBIN (HGB A1C): Hemoglobin A1C: 8.8

## 2016-12-03 NOTE — Patient Instructions (Signed)
Please increase Basaglar to 42 units nightly.

## 2016-12-03 NOTE — Progress Notes (Signed)
Subjective:    Patient ID: Jacob Bishop, male    DOB: 1940/10/30, 76 y.o.   MRN: [id number]  HPI  76 year old male who  has a past medical history of Arthritis; CAD in native artery; Cellulitis; Dermatitis; Diabetes mellitus; Edema; History of kidney stones; colonic polyps; Hyperlipidemia; Hypertension; Obesity; OSA (obstructive sleep apnea); Positional vertigo; Rhinosinusitis; and Skin lesion.   He presents to the office today for three month follow up regarding diabetes. His current regimen is that of Starlix 120 mg, Basaglar  35 units, and Victoza 1.2 mg    He reports that he has been eating carbs and has not been exercising due to chronic knee pain.   His A1c during the last visit was 8.1   Review of Systems See HPI   Past Medical History:  Diagnosis Date  . Arthritis   . CAD in native artery   . Cellulitis   . Dermatitis   . Diabetes mellitus    type II  . Edema   . History of kidney stones   . Hx of colonic polyps   . Hyperlipidemia   . Hypertension   . Obesity   . OSA (obstructive sleep apnea)    cpap  . Positional vertigo   . Rhinosinusitis   . Skin lesion     Social History   Social History  . Marital status: Married    Spouse name: N/A  . Number of children: N/A  . Years of education: N/A   Occupational History  . OWNER, [employer] History Main Topics  . Smoking status: Former Smoker    Quit date: 01/10/1996  . Smokeless tobacco: Never Used  . Alcohol use No  . Drug use: No  . Sexual activity: Not on file   Other Topics Concern  . Not on file   Social History Narrative   Grew up in [place]. Mother was [place], Father [place].   He works daily - owns a [employer]    Married for 30+ years   Has a daughter who lives in [place]     Past Surgical History:  Procedure Laterality Date  . CARDIAC CATHETERIZATION    . COLONOSCOPY    . CORONARY STENT PLACEMENT     3 stents /1997   . POLYPECTOMY      Family History  Problem Relation Age of Onset  . Cancer Mother     ? lung cancer  . Heart disease Father   . Heart attack Father   . Heart disease Sister   . Diabetes Sister   . Heart attack Sister   . Heart attack Brother     Allergies  Allergen Reactions  . Lipitor [Atorvastatin Calcium] Other (See Comments)    Muscle soreness    Current Outpatient Prescriptions on File Prior to Visit  Medication Sig Dispense Refill  . albuterol (PROVENTIL HFA;VENTOLIN HFA) 108 (90 Base) MCG/ACT inhaler Inhale 2 puffs into the lungs every 6 (six) hours as needed for wheezing or shortness of breath. 1 Inhaler 0  . amLODipine (NORVASC) 2.5 MG tablet TAKE 1 TABLET BY MOUTH DAILY 90 tablet 1  . aspirin 81 MG tablet Take 81 mg by mouth daily.      . BD PEN NEEDLE NANO U/F 32G X 4 MM MISC USE AS DIRECTED TWICE A DAY 200 each 0  . fluticasone (FLONASE) 50 MCG/ACT nasal spray Place 2 sprays into both nostrils daily as needed.  16 g 3  . Insulin Glargine (BASAGLAR KWIKPEN) 100 UNIT/ML SOPN Inject 30 to 35 units at 10pm as directed. 3 mL 3  . meclizine (ANTIVERT) 12.5 MG tablet Take 1 tablet (12.5 mg total) by mouth 3 (three) times daily as needed for dizziness. 30 tablet 1  . nateglinide (STARLIX) 120 MG tablet TAKE 1/2 TO ONE TABLET AS DIRECTED BEFORE AM AND EVENING MEAL 180 tablet 0  . simvastatin (ZOCOR) 20 MG tablet TAKE 1 TABLET BY MOUTH EVERY DAY AT BEDTIME 90 tablet 3  . tamsulosin (FLOMAX) 0.4 MG CAPS capsule Take 0.4 mg by mouth daily.  11  . TOVIAZ 8 MG TB24 tablet Take 1 tablet by mouth daily.    Marland Kitchen VICTOZA 18 MG/3ML SOPN INJECT 1.2MG  UNDER THE SKIN EVERY DAY AS DIRECTED 6 pen 0   No current facility-administered medications on file prior to visit.     BP 128/78 (BP Location: Left Arm, Patient Position: Sitting, Cuff Size: Normal)   Temp 98.6 F (37 C) (Oral)   Ht 6' (1.829 m)   Wt 294 lb 9.6 oz (133.6 kg)   BMI 39.95 kg/m       Objective:   Physical Exam    Constitutional: He is oriented to person, place, and time. He appears well-developed and well-nourished. No distress.  Cardiovascular: Normal rate, regular rhythm, normal heart sounds and intact distal pulses.  Exam reveals no gallop and no friction rub.   No murmur heard. Pulmonary/Chest: Effort normal and breath sounds normal. No respiratory distress. He has no wheezes. He has no rales. He exhibits no tenderness.  Musculoskeletal: He exhibits tenderness.       Right knee: He exhibits decreased range of motion. Tenderness found.       Left knee: He exhibits decreased range of motion. Tenderness found.  Neurological: He is alert and oriented to person, place, and time.  Skin: Skin is warm and dry. No rash noted. He is not diaphoretic. No erythema. No pallor.  Psychiatric: He has a normal mood and affect. His behavior is normal. Judgment and thought content normal.  Nursing note and vitals reviewed.     Assessment & Plan:  1. Uncontrolled type 2 diabetes mellitus with diabetic dermatitis, with long-term current use of insulin (HCC) - POC HgB A1c- 8.8. Increased from 8.1.  - Will have him increase Basaglar from 35 units to 42 units nightly - Follow up in 3 months   2. Chronic pain of both knees - Ambulatory referral to Orthopedic Surgery  [name], NP

## 2016-12-23 ENCOUNTER — Ambulatory Visit (INDEPENDENT_AMBULATORY_CARE_PROVIDER_SITE_OTHER): Payer: Self-pay | Admitting: Orthopaedic Surgery

## 2016-12-27 ENCOUNTER — Other Ambulatory Visit: Payer: Self-pay | Admitting: Adult Health

## 2016-12-30 DIAGNOSIS — Z125 Encounter for screening for malignant neoplasm of prostate: Secondary | ICD-10-CM | POA: Diagnosis not present

## 2016-12-30 DIAGNOSIS — R3915 Urgency of urination: Secondary | ICD-10-CM | POA: Diagnosis not present

## 2016-12-30 DIAGNOSIS — N200 Calculus of kidney: Secondary | ICD-10-CM | POA: Diagnosis not present

## 2016-12-30 DIAGNOSIS — R3129 Other microscopic hematuria: Secondary | ICD-10-CM | POA: Diagnosis not present

## 2016-12-30 LAB — PSA: PSA: 2.25

## 2017-01-13 DIAGNOSIS — L821 Other seborrheic keratosis: Secondary | ICD-10-CM | POA: Diagnosis not present

## 2017-01-13 DIAGNOSIS — C44319 Basal cell carcinoma of skin of other parts of face: Secondary | ICD-10-CM | POA: Diagnosis not present

## 2017-01-13 DIAGNOSIS — L859 Epidermal thickening, unspecified: Secondary | ICD-10-CM | POA: Diagnosis not present

## 2017-01-13 DIAGNOSIS — D485 Neoplasm of uncertain behavior of skin: Secondary | ICD-10-CM | POA: Diagnosis not present

## 2017-01-18 ENCOUNTER — Other Ambulatory Visit: Payer: Self-pay | Admitting: Family Medicine

## 2017-02-09 DIAGNOSIS — Z85828 Personal history of other malignant neoplasm of skin: Secondary | ICD-10-CM | POA: Diagnosis not present

## 2017-02-09 DIAGNOSIS — C44319 Basal cell carcinoma of skin of other parts of face: Secondary | ICD-10-CM | POA: Diagnosis not present

## 2017-02-23 DIAGNOSIS — M25562 Pain in left knee: Secondary | ICD-10-CM | POA: Diagnosis not present

## 2017-02-23 DIAGNOSIS — M25561 Pain in right knee: Secondary | ICD-10-CM | POA: Diagnosis not present

## 2017-02-23 DIAGNOSIS — M170 Bilateral primary osteoarthritis of knee: Secondary | ICD-10-CM | POA: Diagnosis not present

## 2017-03-03 DIAGNOSIS — M1711 Unilateral primary osteoarthritis, right knee: Secondary | ICD-10-CM | POA: Diagnosis not present

## 2017-03-03 DIAGNOSIS — M25561 Pain in right knee: Secondary | ICD-10-CM | POA: Diagnosis not present

## 2017-03-04 ENCOUNTER — Ambulatory Visit (INDEPENDENT_AMBULATORY_CARE_PROVIDER_SITE_OTHER): Payer: Medicare Other | Admitting: Adult Health

## 2017-03-04 ENCOUNTER — Encounter: Payer: Self-pay | Admitting: Adult Health

## 2017-03-04 VITALS — BP 104/50 | HR 81 | Temp 98.2°F | Ht 72.0 in | Wt 291.6 lb

## 2017-03-04 DIAGNOSIS — Z794 Long term (current) use of insulin: Secondary | ICD-10-CM

## 2017-03-04 DIAGNOSIS — IMO0002 Reserved for concepts with insufficient information to code with codable children: Secondary | ICD-10-CM

## 2017-03-04 DIAGNOSIS — E11620 Type 2 diabetes mellitus with diabetic dermatitis: Secondary | ICD-10-CM

## 2017-03-04 DIAGNOSIS — E1165 Type 2 diabetes mellitus with hyperglycemia: Secondary | ICD-10-CM | POA: Diagnosis not present

## 2017-03-04 LAB — POCT GLYCOSYLATED HEMOGLOBIN (HGB A1C): Hemoglobin A1C: 9.1

## 2017-03-04 NOTE — Progress Notes (Signed)
Subjective:    Patient ID: Jacob Bishop, male    DOB: [birth year], 76 y.o.   MRN: [id number]  HPI  76 year old male who  has a past medical history of Arthritis; CAD in native artery; Cellulitis; Dermatitis; Diabetes mellitus; Edema; History of kidney stones; colonic polyps; Hyperlipidemia; Hypertension; Obesity; OSA (obstructive sleep apnea); Positional vertigo; Rhinosinusitis; and Skin lesion.  He presents to the office today for 3 months follow up regarding diabetes mellitus. He is currently managed with Basaglar 45 units, Victoza, Starlix 120 mg. During his last visit his A1c had increased to 8.8. At that time insulin was increased to 45 units.   Today he reports that he has tried to work on diet but he is finding it too difficult. He is unable to exercise much due to chronic knee pain.    Review of Systems See HPI   Past Medical History:  Diagnosis Date  . Arthritis   . CAD in native artery   . Cellulitis   . Dermatitis   . Diabetes mellitus    type II  . Edema   . History of kidney stones   . Hx of colonic polyps   . Hyperlipidemia   . Hypertension   . Obesity   . OSA (obstructive sleep apnea)    cpap  . Positional vertigo   . Rhinosinusitis   . Skin lesion     Social History   Social History  . Marital status: Married    Spouse name: N/A  . Number of children: N/A  . Years of education: N/A   Occupational History  . OWNER, [employer] History Main Topics  . Smoking status: Former Smoker    Quit date: 01/10/1996  . Smokeless tobacco: Never Used  . Alcohol use No  . Drug use: No  . Sexual activity: Not on file   Other Topics Concern  . Not on file   Social History Narrative   Grew up in [place]. Mother was [place], Father [place].   He works daily - owns a [employer]    Married for 30+ years   Has a daughter who lives in [place]     Past Surgical History:  Procedure Laterality  Date  . CARDIAC CATHETERIZATION    . COLONOSCOPY    . CORONARY STENT PLACEMENT     3 stents /1997  . POLYPECTOMY      Family History  Problem Relation Age of Onset  . Cancer Mother        ? lung cancer  . Heart disease Father   . Heart attack Father   . Heart disease Sister   . Diabetes Sister   . Heart attack Sister   . Heart attack Brother     Allergies  Allergen Reactions  . Lipitor [Atorvastatin Calcium] Other (See Comments)    Muscle soreness    Current Outpatient Prescriptions on File Prior to Visit  Medication Sig Dispense Refill  . albuterol (PROVENTIL HFA;VENTOLIN HFA) 108 (90 Base) MCG/ACT inhaler Inhale 2 puffs into the lungs every 6 (six) hours as needed for wheezing or shortness of breath. 1 Inhaler 0  . amLODipine (NORVASC) 2.5 MG tablet TAKE 1 TABLET BY MOUTH DAILY 90 tablet 1  . aspirin 81 MG tablet Take 81 mg by mouth daily.      . BD PEN NEEDLE NANO U/F 32G X 4 MM MISC USE AS DIRECTED TWICE A DAY 200 each  0  . fluticasone (FLONASE) 50 MCG/ACT nasal spray Place 2 sprays into both nostrils daily as needed. 16 g 3  . Insulin Glargine (BASAGLAR KWIKPEN) 100 UNIT/ML SOPN Inject 30 to 35 units at 10pm as directed. (Patient taking differently: Inject 42 Units into the skin daily at 10 pm. Inject 30 to 35 units at 10pm as directed.) 3 mL 3  . meclizine (ANTIVERT) 12.5 MG tablet Take 1 tablet (12.5 mg total) by mouth 3 (three) times daily as needed for dizziness. 30 tablet 1  . nateglinide (STARLIX) 120 MG tablet TAKE 1/2 TO ONE TABLET AS DIRECTED BEFORE AM AND EVENING MEAL 180 tablet 0  . simvastatin (ZOCOR) 20 MG tablet TAKE 1 TABLET BY MOUTH EVERY DAY AT BEDTIME 90 tablet 3  . tamsulosin (FLOMAX) 0.4 MG CAPS capsule Take 0.4 mg by mouth daily.  11  . TOVIAZ 8 MG TB24 tablet Take 1 tablet by mouth daily.    Marland Kitchen VICTOZA 18 MG/3ML SOPN INJECT 1.2MG  UNDER THE SKIN EVERY DAY AS DIRECTED 15 mL 1   No current facility-administered medications on file prior to visit.      BP (!) 104/50   Pulse 81   Temp 98.2 F (36.8 C) (Oral)   Ht 6' (1.829 m)   Wt 291 lb 9.6 oz (132.3 kg)   BMI 39.55 kg/m       Objective:   Physical Exam  Constitutional: He is oriented to person, place, and time. He appears well-developed and well-nourished. No distress.  Obese   Cardiovascular: Normal rate, regular rhythm, normal heart sounds and intact distal pulses.  Exam reveals no gallop and no friction rub.   No murmur heard. Pulmonary/Chest: Effort normal and breath sounds normal. No respiratory distress. He has no wheezes. He has no rales. He exhibits no tenderness.  Neurological: He is alert and oriented to person, place, and time.  Skin: Skin is warm and dry. No rash noted. He is not diaphoretic. No erythema. No pallor.  Psychiatric: He has a normal mood and affect. His behavior is normal. Judgment and thought content normal.  Nursing note and vitals reviewed.     Assessment & Plan:  1. Uncontrolled type 2 diabetes mellitus with diabetic dermatitis, with long-term current use of insulin (HCC) - POC HgB A1c- increased to 9.1.  - I will refer him to endocrinology for further evaluation.  - Advised to continue working on low carb, low sugar diet.  - Ambulatory referral to Endocrinology  [name], NP

## 2017-03-10 DIAGNOSIS — M25562 Pain in left knee: Secondary | ICD-10-CM | POA: Diagnosis not present

## 2017-03-10 DIAGNOSIS — M1712 Unilateral primary osteoarthritis, left knee: Secondary | ICD-10-CM | POA: Diagnosis not present

## 2017-03-17 DIAGNOSIS — M170 Bilateral primary osteoarthritis of knee: Secondary | ICD-10-CM | POA: Diagnosis not present

## 2017-03-17 DIAGNOSIS — M25561 Pain in right knee: Secondary | ICD-10-CM | POA: Diagnosis not present

## 2017-03-17 DIAGNOSIS — M25562 Pain in left knee: Secondary | ICD-10-CM | POA: Diagnosis not present

## 2017-04-17 ENCOUNTER — Other Ambulatory Visit: Payer: Self-pay | Admitting: Adult Health

## 2017-04-17 NOTE — Telephone Encounter (Signed)
Sent to the pharmacy by e-scribe.  Has upcoming appt with endo on 04/20/17.

## 2017-04-20 ENCOUNTER — Ambulatory Visit: Payer: Medicare Other | Admitting: Endocrinology

## 2017-05-28 ENCOUNTER — Ambulatory Visit: Payer: Medicare Other | Admitting: Endocrinology

## 2017-06-02 ENCOUNTER — Other Ambulatory Visit: Payer: Self-pay | Admitting: Adult Health

## 2017-06-02 NOTE — Telephone Encounter (Signed)
Sent to the pharmacy by e-scribe for 1 month.  Pt has upcoming appt with Dr. Loanne Drilling on 07/01/17.

## 2017-06-25 DIAGNOSIS — M25561 Pain in right knee: Secondary | ICD-10-CM | POA: Diagnosis not present

## 2017-06-25 DIAGNOSIS — M25562 Pain in left knee: Secondary | ICD-10-CM | POA: Diagnosis not present

## 2017-06-25 DIAGNOSIS — M170 Bilateral primary osteoarthritis of knee: Secondary | ICD-10-CM | POA: Diagnosis not present

## 2017-06-30 DIAGNOSIS — Z7984 Long term (current) use of oral hypoglycemic drugs: Secondary | ICD-10-CM | POA: Diagnosis not present

## 2017-06-30 DIAGNOSIS — H04123 Dry eye syndrome of bilateral lacrimal glands: Secondary | ICD-10-CM | POA: Diagnosis not present

## 2017-06-30 DIAGNOSIS — H2513 Age-related nuclear cataract, bilateral: Secondary | ICD-10-CM | POA: Diagnosis not present

## 2017-06-30 DIAGNOSIS — H25013 Cortical age-related cataract, bilateral: Secondary | ICD-10-CM | POA: Diagnosis not present

## 2017-06-30 DIAGNOSIS — H18413 Arcus senilis, bilateral: Secondary | ICD-10-CM | POA: Diagnosis not present

## 2017-06-30 DIAGNOSIS — H40033 Anatomical narrow angle, bilateral: Secondary | ICD-10-CM | POA: Diagnosis not present

## 2017-06-30 DIAGNOSIS — H52223 Regular astigmatism, bilateral: Secondary | ICD-10-CM | POA: Diagnosis not present

## 2017-06-30 DIAGNOSIS — H11153 Pinguecula, bilateral: Secondary | ICD-10-CM | POA: Diagnosis not present

## 2017-06-30 DIAGNOSIS — H40023 Open angle with borderline findings, high risk, bilateral: Secondary | ICD-10-CM | POA: Diagnosis not present

## 2017-06-30 DIAGNOSIS — H5203 Hypermetropia, bilateral: Secondary | ICD-10-CM | POA: Diagnosis not present

## 2017-07-01 ENCOUNTER — Ambulatory Visit (INDEPENDENT_AMBULATORY_CARE_PROVIDER_SITE_OTHER): Payer: Medicare Other | Admitting: Endocrinology

## 2017-07-01 ENCOUNTER — Encounter: Payer: Self-pay | Admitting: Endocrinology

## 2017-07-01 VITALS — BP 132/78 | HR 93 | Wt 293.6 lb

## 2017-07-01 DIAGNOSIS — E1165 Type 2 diabetes mellitus with hyperglycemia: Secondary | ICD-10-CM

## 2017-07-01 LAB — POCT GLYCOSYLATED HEMOGLOBIN (HGB A1C): Hemoglobin A1C: 8.5

## 2017-07-01 MED ORDER — BASAGLAR KWIKPEN 100 UNIT/ML ~~LOC~~ SOPN: 40.0000 [IU] | PEN_INJECTOR | SUBCUTANEOUS | 1 refills | Status: DC

## 2017-07-01 NOTE — Patient Instructions (Addendum)
good diet and exercise significantly improve the control of your diabetes.  please let me know if you wish to be referred to a dietician.  high blood sugar is very risky to your health.  you should see an eye doctor and dentist every year.  It is very important to get all recommended vaccinations.  Controlling your blood pressure and cholesterol drastically reduces the damage diabetes does to your body.  Those who smoke should quit.  Please discuss these with your doctor.  check your blood sugar twice a day.  vary the time of day when you check, between before the 3 meals, and at bedtime.  also check if you have symptoms of your blood sugar being too high or too low.  please keep a record of the readings and bring it to your next appointment here (or you can bring the meter itself).  You can write it on any piece of paper.  please call us sooner if your blood sugar goes below 70, or if you have a lot of readings over 200. Please continue the same vicoza, and:  Increase the basaglar to 40 units each morning.   Please come back for a follow-up appointment in 2 months.

## 2017-07-01 NOTE — Progress Notes (Signed)
Subjective:    Patient ID: Jacob Bishop, male    DOB: March 04, 1941, 76 y.o.   MRN: [id number]  HPI pt is referred by [name], NP, for diabetes.  Pt states DM was dx'ed in 2011; he has mild if any neuropathy of the lower extremities; he has associated CAD; he has been on insulin since 2015; pt says his diet is poor, and exercise is limited by health problems; he has never had pancreatitis, pancreatic surgery, severe hypoglycemia or DKA.  He takes basaglar 35/d, victoza.  He did not tolerate metformin, including -XR (nausea).  He says cbg's are approx 200.  Past Medical History:  Diagnosis Date  . Arthritis   . CAD in native artery   . Cellulitis   . Dermatitis   . Diabetes mellitus    type II  . Edema   . History of kidney stones   . Hx of colonic polyps   . Hyperlipidemia   . Hypertension   . Obesity   . OSA (obstructive sleep apnea)    cpap  . Positional vertigo   . Rhinosinusitis   . Skin lesion     Past Surgical History:  Procedure Laterality Date  . CARDIAC CATHETERIZATION    . COLONOSCOPY    . CORONARY STENT PLACEMENT     3 stents /1997  . POLYPECTOMY      Social History   Socioeconomic History  . Marital status: Married    Spouse name: Not on file  . Number of children: Not on file  . Years of education: Not on file  . Highest education level: Not on file  Social Needs  . Financial resource strain: Not on file  . Food insecurity - worry: Not on file  . Food insecurity - inability: Not on file  . Transportation needs - medical: Not on file  . Transportation needs - non-medical: Not on file  Occupational History  . Occupation: Financial controller, Health and safety inspector: [employer]  Tobacco Use  . Smoking status: Former Smoker    Last attempt to quit: 01/10/1996    Years since quitting: 21.4  . Smokeless tobacco: Never Used  Substance and Sexual Activity  . Alcohol use: No  . Drug use: No  . Sexual activity: Not on file  Other Topics Concern    . Not on file  Social History Narrative   Grew up in [place]. Mother was [place], Father [place].   He works daily - owns a [employer]    Married for 30+ years   Has a daughter who lives in [place]     Current Outpatient Medications on File Prior to Visit  Medication Sig Dispense Refill  . albuterol (PROVENTIL HFA;VENTOLIN HFA) 108 (90 Base) MCG/ACT inhaler Inhale 2 puffs into the lungs every 6 (six) hours as needed for wheezing or shortness of breath. 1 Inhaler 0  . amLODipine (NORVASC) 2.5 MG tablet TAKE 1 TABLET BY MOUTH DAILY 90 tablet 1  . aspirin 81 MG tablet Take 81 mg by mouth daily.      . BD PEN NEEDLE NANO U/F 32G X 4 MM MISC USE AS DIRECTED TWICE A DAY 200 each 0  . fluticasone (FLONASE) 50 MCG/ACT nasal spray Place 2 sprays into both nostrils daily as needed. 16 g 3  . meclizine (ANTIVERT) 12.5 MG tablet Take 1 tablet (12.5 mg total) by mouth 3 (three) times daily as needed for dizziness. 30 tablet 1  .  simvastatin (ZOCOR) 20 MG tablet TAKE 1 TABLET BY MOUTH EVERY DAY AT BEDTIME 90 tablet 3  . tamsulosin (FLOMAX) 0.4 MG CAPS capsule Take 0.4 mg by mouth daily.  11  . TOVIAZ 8 MG TB24 tablet Take 1 tablet by mouth daily.    Marland Kitchen VICTOZA 18 MG/3ML SOPN INJECT 1.2MG  UNDER THE SKIN EVERY DAY AS DIRECTED 15 mL 1   No current facility-administered medications on file prior to visit.     Allergies  Allergen Reactions  . Lipitor [Atorvastatin Calcium] Other (See Comments)    Muscle soreness    Family History  Problem Relation Age of Onset  . Cancer Mother        ? lung cancer  . Heart disease Father   . Heart attack Father   . Heart disease Sister   . Diabetes Sister   . Heart attack Sister   . Heart attack Brother     BP 132/78 (BP Location: Right Wrist, Patient Position: Sitting, Cuff Size: Normal)   Pulse 93   Wt 293 lb 9.6 oz (133.2 kg)   SpO2 94%   BMI 39.82 kg/m    Review of Systems denies weight loss, blurry vision,  headache, chest pain, sob, n/v, muscle cramps, excessive diaphoresis, memory loss, depression, cold intolerance, hypoglycemia, and rhinorrhea.  He has frequent urination and easy bruising.     Objective:   Physical Exam VS: see vs page GEN: no distress.  In wheelchair.   HEAD: head: no deformity eyes: no periorbital swelling, no proptosis external nose and ears are normal mouth: no lesion seen NECK: supple, thyroid is not enlarged CHEST WALL: no deformity LUNGS: clear to auscultation CV: reg rate and rhythm, no murmur ABD: abdomen is soft, nontender.  no hepatosplenomegaly.  not distended.  no hernia MUSCULOSKELETAL: muscle bulk and strength are grossly normal.  no obvious joint swelling.  gait is normal and steady EXTEMITIES: no deformity.  no ulcer on the feet.  feet are of normal temp, but there is hyperpigmentation of the legs.  1+ bilat leg edema, and bilat vv's.  PULSES: dorsalis pedis intact bilat.  no carotid bruit NEURO:  cn 2-12 grossly intact.   readily moves all 4's.  sensation is intact to touch on the feet SKIN:  Normal texture and temperature.  No rash or suspicious lesion is visible.   NODES:  None palpable at the neck PSYCH: alert, well-oriented.  Does not appear anxious nor depressed.  Lab Results  Component Value Date   HGBA1C 8.5 07/01/2017   Lab Results  Component Value Date   CREATININE 1.01 09/04/2016   BUN 23 09/04/2016   NA 139 09/04/2016   K 4.3 09/04/2016   CL 103 09/04/2016   CO2 29 09/04/2016   I personally reviewed electrocardiogram tracing (06/11/16): Indication: f/u CAD Impression: NSR.  No MI.  No hypertrophy. Compared to 2015: no significant change     Assessment & Plan:  Insulin-requiring type 2 DM, with CAD: new to me.  He needs increased rx.  Patient Instructions  good diet and exercise significantly improve the control of your diabetes.  please let me know if you wish to be referred to a dietician.  high blood sugar is very risky to  your health.  you should see an eye doctor and dentist every year.  It is very important to get all recommended vaccinations.  Controlling your blood pressure and cholesterol drastically reduces the damage diabetes does to your body.  Those who smoke should  quit.  Please discuss these with your doctor.  check your blood sugar twice a day.  vary the time of day when you check, between before the 3 meals, and at bedtime.  also check if you have symptoms of your blood sugar being too high or too low.  please keep a record of the readings and bring it to your next appointment here (or you can bring the meter itself).  You can write it on any piece of paper.  please call us sooner if your blood sugar goes below 70, or if you have a lot of readings over 200. Please continue the same vicoza, and:  Increase the basaglar to 40 units each morning.   Please come back for a follow-up appointment in 2 months.

## 2017-07-02 DIAGNOSIS — M25561 Pain in right knee: Secondary | ICD-10-CM | POA: Diagnosis not present

## 2017-07-02 DIAGNOSIS — M1711 Unilateral primary osteoarthritis, right knee: Secondary | ICD-10-CM | POA: Diagnosis not present

## 2017-07-09 DIAGNOSIS — M1712 Unilateral primary osteoarthritis, left knee: Secondary | ICD-10-CM | POA: Diagnosis not present

## 2017-07-09 DIAGNOSIS — M25562 Pain in left knee: Secondary | ICD-10-CM | POA: Diagnosis not present

## 2017-07-12 ENCOUNTER — Other Ambulatory Visit: Payer: Self-pay | Admitting: Adult Health

## 2017-07-14 ENCOUNTER — Other Ambulatory Visit: Payer: Self-pay | Admitting: Adult Health

## 2017-07-14 NOTE — Telephone Encounter (Signed)
Assessment & Plan:  Insulin-requiring type 2 DM, with CAD: new to me.  He needs increased rx.  Patient Instructions  good diet and exercise significantly improve the control of your diabetes.  please let me know if you wish to be referred to a dietician.  high blood sugar is very risky to your health.  you should see an eye doctor and dentist every year.  It is very important to get all recommended vaccinations.  Controlling your blood pressure and cholesterol drastically reduces the damage diabetes does to your body.  Those who smoke should quit.  Please discuss these with your doctor.  check your blood sugar twice a day.  vary the time of day when you check, between before the 3 meals, and at bedtime.  also check if you have symptoms of your blood sugar being too high or too low.  please keep a record of the readings and bring it to your next appointment here (or you can bring the meter itself).  You can write it on any piece of paper.  please call us sooner if your blood sugar goes below 70, or if you have a lot of readings over 200. Please continue the same vicoza, and:  Increase the basaglar to 40 units each morning.   Please come back for a follow-up appointment in 2 months.     Now controlled by Dr. Loanne Drilling.  Will forward.

## 2017-07-14 NOTE — Telephone Encounter (Signed)
Sent to the pharmacy by e-scribe for 90 days.  Pt due for yearly 08/2017.

## 2017-07-22 DIAGNOSIS — M25561 Pain in right knee: Secondary | ICD-10-CM | POA: Diagnosis not present

## 2017-07-22 DIAGNOSIS — M1711 Unilateral primary osteoarthritis, right knee: Secondary | ICD-10-CM | POA: Diagnosis not present

## 2017-07-27 ENCOUNTER — Other Ambulatory Visit: Payer: Self-pay | Admitting: Adult Health

## 2017-07-28 DIAGNOSIS — M25562 Pain in left knee: Secondary | ICD-10-CM | POA: Diagnosis not present

## 2017-07-28 DIAGNOSIS — M1712 Unilateral primary osteoarthritis, left knee: Secondary | ICD-10-CM | POA: Diagnosis not present

## 2017-07-28 NOTE — Telephone Encounter (Signed)
Now a pt of Dr. Loanne Drilling.  Pharmacy is requesting a 90 day rx.

## 2017-07-28 NOTE — Telephone Encounter (Signed)
40 units qam Please refill prn

## 2017-07-30 ENCOUNTER — Other Ambulatory Visit: Payer: Self-pay

## 2017-07-30 MED ORDER — BASAGLAR KWIKPEN 100 UNIT/ML ~~LOC~~ SOPN: 40.0000 [IU] | PEN_INJECTOR | SUBCUTANEOUS | 99 refills | Status: DC

## 2017-08-05 DIAGNOSIS — M25561 Pain in right knee: Secondary | ICD-10-CM | POA: Diagnosis not present

## 2017-08-05 DIAGNOSIS — M25562 Pain in left knee: Secondary | ICD-10-CM | POA: Diagnosis not present

## 2017-08-05 DIAGNOSIS — M170 Bilateral primary osteoarthritis of knee: Secondary | ICD-10-CM | POA: Diagnosis not present

## 2017-08-08 ENCOUNTER — Other Ambulatory Visit: Payer: Self-pay | Admitting: Adult Health

## 2017-08-10 NOTE — Telephone Encounter (Signed)
Pt has established with Dr. Loanne Drilling.  Will send for his authorization.

## 2017-08-24 DIAGNOSIS — L905 Scar conditions and fibrosis of skin: Secondary | ICD-10-CM | POA: Diagnosis not present

## 2017-08-24 DIAGNOSIS — Z85828 Personal history of other malignant neoplasm of skin: Secondary | ICD-10-CM | POA: Diagnosis not present

## 2017-08-31 ENCOUNTER — Ambulatory Visit: Payer: Medicare Other | Admitting: Endocrinology

## 2017-08-31 DIAGNOSIS — Z0289 Encounter for other administrative examinations: Secondary | ICD-10-CM

## 2017-09-08 DIAGNOSIS — H25013 Cortical age-related cataract, bilateral: Secondary | ICD-10-CM | POA: Diagnosis not present

## 2017-09-08 DIAGNOSIS — H02839 Dermatochalasis of unspecified eye, unspecified eyelid: Secondary | ICD-10-CM | POA: Diagnosis not present

## 2017-09-08 DIAGNOSIS — H2513 Age-related nuclear cataract, bilateral: Secondary | ICD-10-CM | POA: Diagnosis not present

## 2017-09-08 DIAGNOSIS — H25043 Posterior subcapsular polar age-related cataract, bilateral: Secondary | ICD-10-CM | POA: Diagnosis not present

## 2017-09-08 DIAGNOSIS — H2512 Age-related nuclear cataract, left eye: Secondary | ICD-10-CM | POA: Diagnosis not present

## 2017-09-14 ENCOUNTER — Encounter (HOSPITAL_BASED_OUTPATIENT_CLINIC_OR_DEPARTMENT_OTHER): Payer: Medicare Other | Attending: Internal Medicine

## 2017-09-14 DIAGNOSIS — Z794 Long term (current) use of insulin: Secondary | ICD-10-CM | POA: Insufficient documentation

## 2017-09-14 DIAGNOSIS — L98423 Non-pressure chronic ulcer of back with necrosis of muscle: Secondary | ICD-10-CM | POA: Diagnosis not present

## 2017-09-14 DIAGNOSIS — E669 Obesity, unspecified: Secondary | ICD-10-CM | POA: Diagnosis not present

## 2017-09-14 DIAGNOSIS — T8189XA Other complications of procedures, not elsewhere classified, initial encounter: Secondary | ICD-10-CM | POA: Diagnosis not present

## 2017-09-14 DIAGNOSIS — Z955 Presence of coronary angioplasty implant and graft: Secondary | ICD-10-CM | POA: Diagnosis not present

## 2017-09-14 DIAGNOSIS — Z87891 Personal history of nicotine dependence: Secondary | ICD-10-CM | POA: Diagnosis not present

## 2017-09-14 DIAGNOSIS — I2510 Atherosclerotic heart disease of native coronary artery without angina pectoris: Secondary | ICD-10-CM | POA: Insufficient documentation

## 2017-09-14 DIAGNOSIS — F0390 Unspecified dementia without behavioral disturbance: Secondary | ICD-10-CM | POA: Diagnosis not present

## 2017-09-14 DIAGNOSIS — E11622 Type 2 diabetes mellitus with other skin ulcer: Secondary | ICD-10-CM | POA: Insufficient documentation

## 2017-09-14 DIAGNOSIS — Z6841 Body Mass Index (BMI) 40.0 and over, adult: Secondary | ICD-10-CM | POA: Insufficient documentation

## 2017-09-14 DIAGNOSIS — G4733 Obstructive sleep apnea (adult) (pediatric): Secondary | ICD-10-CM | POA: Insufficient documentation

## 2017-09-21 DIAGNOSIS — T8189XA Other complications of procedures, not elsewhere classified, initial encounter: Secondary | ICD-10-CM | POA: Diagnosis not present

## 2017-09-21 DIAGNOSIS — L98423 Non-pressure chronic ulcer of back with necrosis of muscle: Secondary | ICD-10-CM | POA: Diagnosis not present

## 2017-09-21 DIAGNOSIS — I2510 Atherosclerotic heart disease of native coronary artery without angina pectoris: Secondary | ICD-10-CM | POA: Diagnosis not present

## 2017-09-21 DIAGNOSIS — Z955 Presence of coronary angioplasty implant and graft: Secondary | ICD-10-CM | POA: Diagnosis not present

## 2017-09-21 DIAGNOSIS — E669 Obesity, unspecified: Secondary | ICD-10-CM | POA: Diagnosis not present

## 2017-09-21 DIAGNOSIS — E11622 Type 2 diabetes mellitus with other skin ulcer: Secondary | ICD-10-CM | POA: Diagnosis not present

## 2017-09-21 DIAGNOSIS — Z6841 Body Mass Index (BMI) 40.0 and over, adult: Secondary | ICD-10-CM | POA: Diagnosis not present

## 2017-09-21 DIAGNOSIS — L259 Unspecified contact dermatitis, unspecified cause: Secondary | ICD-10-CM | POA: Diagnosis not present

## 2017-09-25 ENCOUNTER — Encounter (HOSPITAL_BASED_OUTPATIENT_CLINIC_OR_DEPARTMENT_OTHER): Payer: Medicare Other

## 2017-09-28 ENCOUNTER — Encounter (HOSPITAL_BASED_OUTPATIENT_CLINIC_OR_DEPARTMENT_OTHER): Payer: Medicare Other | Attending: Internal Medicine

## 2017-09-28 ENCOUNTER — Other Ambulatory Visit: Payer: Self-pay | Admitting: Internal Medicine

## 2017-09-28 DIAGNOSIS — G4730 Sleep apnea, unspecified: Secondary | ICD-10-CM | POA: Diagnosis not present

## 2017-09-28 DIAGNOSIS — F0390 Unspecified dementia without behavioral disturbance: Secondary | ICD-10-CM | POA: Diagnosis not present

## 2017-09-28 DIAGNOSIS — L98499 Non-pressure chronic ulcer of skin of other sites with unspecified severity: Secondary | ICD-10-CM | POA: Diagnosis not present

## 2017-09-28 DIAGNOSIS — E11622 Type 2 diabetes mellitus with other skin ulcer: Secondary | ICD-10-CM | POA: Diagnosis not present

## 2017-09-28 DIAGNOSIS — T8189XA Other complications of procedures, not elsewhere classified, initial encounter: Secondary | ICD-10-CM | POA: Diagnosis not present

## 2017-09-28 DIAGNOSIS — Z955 Presence of coronary angioplasty implant and graft: Secondary | ICD-10-CM | POA: Diagnosis not present

## 2017-09-28 DIAGNOSIS — Z6841 Body Mass Index (BMI) 40.0 and over, adult: Secondary | ICD-10-CM | POA: Diagnosis not present

## 2017-09-28 DIAGNOSIS — L98422 Non-pressure chronic ulcer of back with fat layer exposed: Secondary | ICD-10-CM | POA: Diagnosis not present

## 2017-09-28 DIAGNOSIS — E1136 Type 2 diabetes mellitus with diabetic cataract: Secondary | ICD-10-CM | POA: Insufficient documentation

## 2017-09-28 DIAGNOSIS — I2510 Atherosclerotic heart disease of native coronary artery without angina pectoris: Secondary | ICD-10-CM | POA: Insufficient documentation

## 2017-10-05 DIAGNOSIS — I2510 Atherosclerotic heart disease of native coronary artery without angina pectoris: Secondary | ICD-10-CM | POA: Diagnosis not present

## 2017-10-05 DIAGNOSIS — G4730 Sleep apnea, unspecified: Secondary | ICD-10-CM | POA: Diagnosis not present

## 2017-10-05 DIAGNOSIS — E11622 Type 2 diabetes mellitus with other skin ulcer: Secondary | ICD-10-CM | POA: Diagnosis not present

## 2017-10-05 DIAGNOSIS — T8189XA Other complications of procedures, not elsewhere classified, initial encounter: Secondary | ICD-10-CM | POA: Diagnosis not present

## 2017-10-05 DIAGNOSIS — E1136 Type 2 diabetes mellitus with diabetic cataract: Secondary | ICD-10-CM | POA: Diagnosis not present

## 2017-10-05 DIAGNOSIS — L98422 Non-pressure chronic ulcer of back with fat layer exposed: Secondary | ICD-10-CM | POA: Diagnosis not present

## 2017-10-05 DIAGNOSIS — F0390 Unspecified dementia without behavioral disturbance: Secondary | ICD-10-CM | POA: Diagnosis not present

## 2017-10-08 ENCOUNTER — Ambulatory Visit (INDEPENDENT_AMBULATORY_CARE_PROVIDER_SITE_OTHER): Payer: Medicare Other | Admitting: Adult Health

## 2017-10-08 ENCOUNTER — Encounter: Payer: Self-pay | Admitting: Adult Health

## 2017-10-08 VITALS — BP 142/64 | Temp 98.1°F | Wt 297.0 lb

## 2017-10-08 DIAGNOSIS — E1165 Type 2 diabetes mellitus with hyperglycemia: Secondary | ICD-10-CM

## 2017-10-08 LAB — POCT GLYCOSYLATED HEMOGLOBIN (HGB A1C): HEMOGLOBIN A1C: 8.2

## 2017-10-08 MED ORDER — GLIPIZIDE 5 MG PO TABS: 5.0000 mg | ORAL_TABLET | Freq: Two times a day (BID) | ORAL | 3 refills | Status: DC

## 2017-10-08 NOTE — Progress Notes (Signed)
Subjective:    Patient ID: Jacob Bishop, male    DOB: 15-Aug-1941, 77 y.o.   MRN: [id number]  HPI  77 year old male who  has a past medical history of Arthritis, CAD in native artery, Cellulitis, Dermatitis, Diabetes mellitus, Edema, History of kidney stones, colonic polyps, Hyperlipidemia, Hypertension, Obesity, OSA (obstructive sleep apnea), Positional vertigo, Rhinosinusitis, and Skin lesion. He presents to the office today for follow up regarding diabetes. I had sent him to see Endocrinology and went for his initial appointment. At this visit, he was advised to increase Basaglar to 40 units and keep Invokana the same - he is also to take his injections in the morning. Marland Kitchen He does not wish to go back to Endocrinology at this time and would prefer that I manage his diabetes.   His last A1c was 8.5 in November 2018.   Lab Results  Component Value Date   HGBA1C 8.5 07/01/2017   His diet is poor and he does not exercise   He does not check his blood sugars at home.    Review of Systems See HPI   Past Medical History:  Diagnosis Date  . Arthritis   . CAD in native artery   . Cellulitis   . Dermatitis   . Diabetes mellitus    type II  . Edema   . History of kidney stones   . Hx of colonic polyps   . Hyperlipidemia   . Hypertension   . Obesity   . OSA (obstructive sleep apnea)    cpap  . Positional vertigo   . Rhinosinusitis   . Skin lesion     Social History   Socioeconomic History  . Marital status: Married    Spouse name: Not on file  . Number of children: Not on file  . Years of education: Not on file  . Highest education level: Not on file  Social Needs  . Financial resource strain: Not on file  . Food insecurity - worry: Not on file  . Food insecurity - inability: Not on file  . Transportation needs - medical: Not on file  . Transportation needs - non-medical: Not on file  Occupational History  . Occupation: Financial controller, Health and safety inspector: [employer]  Tobacco Use  . Smoking status: Former Smoker    Last attempt to quit: 01/10/1996    Years since quitting: 21.7  . Smokeless tobacco: Never Used  Substance and Sexual Activity  . Alcohol use: No  . Drug use: No  . Sexual activity: Not on file  Other Topics Concern  . Not on file  Social History Narrative   Grew up in [place]. Mother was [place], Father [place].   He works daily - owns a [employer]    Married for 30+ years   Has a daughter who lives in [place]     Past Surgical History:  Procedure Laterality Date  . CARDIAC CATHETERIZATION    . COLONOSCOPY    . CORONARY STENT PLACEMENT     3 stents /1997  . POLYPECTOMY      Family History  Problem Relation Age of Onset  . Cancer Mother        ? lung cancer  . Heart disease Father   . Heart attack Father   . Heart disease Sister   . Diabetes Sister   . Heart attack Sister   . Heart attack Brother     Allergies  Allergen Reactions  . Lipitor [Atorvastatin Calcium] Other (See Comments)    Muscle soreness    Current Outpatient Medications on File Prior to Visit  Medication Sig Dispense Refill  . albuterol (PROVENTIL HFA;VENTOLIN HFA) 108 (90 Base) MCG/ACT inhaler Inhale 2 puffs into the lungs every 6 (six) hours as needed for wheezing or shortness of breath. 1 Inhaler 0  . amLODipine (NORVASC) 2.5 MG tablet TAKE 1 TABLET BY MOUTH EVERY DAY 90 tablet 0  . aspirin 81 MG tablet Take 81 mg by mouth daily.      . BD PEN NEEDLE NANO U/F 32G X 4 MM MISC USE AS DIRECTED TWICE A DAY 200 each 0  . fluticasone (FLONASE) 50 MCG/ACT nasal spray Place 2 sprays into both nostrils daily as needed. 16 g 3  . Insulin Glargine (BASAGLAR KWIKPEN) 100 UNIT/ML SOPN Inject 0.4 mLs (40 Units total) into the skin every morning. And pen needles 1/day 5 pen 0  . meclizine (ANTIVERT) 12.5 MG tablet Take 1 tablet (12.5 mg total) by mouth 3 (three) times daily as needed for dizziness. 30 tablet 1  . SANTYL  ointment OINTMENT TOPICAL APPLY TO WOUND WITH DRESSING CHANGES  0  . simvastatin (ZOCOR) 20 MG tablet TAKE 1 TABLET BY MOUTH EVERY DAY AT BEDTIME 90 tablet 3  . tamsulosin (FLOMAX) 0.4 MG CAPS capsule Take 0.4 mg by mouth daily.  11  . TOVIAZ 8 MG TB24 tablet Take 1 tablet by mouth daily.    Marland Kitchen VICTOZA 18 MG/3ML SOPN INJECT 1.2MG  UNDER THE SKIN EVERY DAY AS DIRECTED 15 mL 1   No current facility-administered medications on file prior to visit.     BP (!) 142/64 (BP Location: Left Arm)   Temp 98.1 F (36.7 C) (Oral)   Wt 297 lb (134.7 kg)   BMI 40.28 kg/m       Objective:   Physical Exam  Constitutional: He is oriented to person, place, and time. He appears well-developed and well-nourished. No distress.  Obese   Cardiovascular: Normal rate, regular rhythm, normal heart sounds and intact distal pulses. Exam reveals no gallop and no friction rub.  No murmur heard. Pulmonary/Chest: Effort normal and breath sounds normal. No respiratory distress. He has no wheezes. He has no rales. He exhibits no tenderness.  Neurological: He is alert and oriented to person, place, and time.  Skin: Skin is warm and dry. No rash noted. He is not diaphoretic. No erythema. No pallor.  Psychiatric: He has a normal mood and affect. His behavior is normal. Judgment and thought content normal.  Nursing note and vitals reviewed.     Assessment & Plan:  1. Uncontrolled type 2 diabetes mellitus with hyperglycemia (HCC)  - POCT A1C- 8.2 - has improved.  -I would like him to start monitoring his blood sugars at home.  His biggest obstacle is diet and lack of exercise.  We will keep Basaglar and Victoza doses the same - glipiZIDE (GLUCOTROL) 5 MG tablet; Take 1 tablet (5 mg total) by mouth 2 (two) times daily before a meal.  Dispense: 60 tablet; Refill: 3 -Follow up in 3 months for CPE  [name], NP

## 2017-10-12 DIAGNOSIS — I2510 Atherosclerotic heart disease of native coronary artery without angina pectoris: Secondary | ICD-10-CM | POA: Diagnosis not present

## 2017-10-12 DIAGNOSIS — F0390 Unspecified dementia without behavioral disturbance: Secondary | ICD-10-CM | POA: Diagnosis not present

## 2017-10-12 DIAGNOSIS — E11622 Type 2 diabetes mellitus with other skin ulcer: Secondary | ICD-10-CM | POA: Diagnosis not present

## 2017-10-12 DIAGNOSIS — T8189XA Other complications of procedures, not elsewhere classified, initial encounter: Secondary | ICD-10-CM | POA: Diagnosis not present

## 2017-10-12 DIAGNOSIS — E1136 Type 2 diabetes mellitus with diabetic cataract: Secondary | ICD-10-CM | POA: Diagnosis not present

## 2017-10-12 DIAGNOSIS — L98422 Non-pressure chronic ulcer of back with fat layer exposed: Secondary | ICD-10-CM | POA: Diagnosis not present

## 2017-10-12 DIAGNOSIS — G4730 Sleep apnea, unspecified: Secondary | ICD-10-CM | POA: Diagnosis not present

## 2017-10-17 ENCOUNTER — Other Ambulatory Visit: Payer: Self-pay | Admitting: Adult Health

## 2017-10-19 ENCOUNTER — Telehealth: Payer: Self-pay

## 2017-10-19 NOTE — Telephone Encounter (Signed)
S/w pt has upcoming appt for pre-op clearance with Tommye Standard, PA, February 26.

## 2017-10-19 NOTE — Telephone Encounter (Signed)
   Primary Cardiologist: [place]  Chart reviewed as part of pre-operative protocol coverage. Because of Alik Mawson Kant's past medical history and time since last visit, he/she will require a follow-up visit in order to better assess preoperative cardiovascular risk.  Pre-op covering staff: - Please schedule appointment and call patient to inform them. - Please contact requesting surgeon's office via preferred method (i.e, phone, fax) to inform them of need for appointment prior to surgery.  Truitt Merle, NP  10/19/2017, 1:44 PM

## 2017-10-19 NOTE — Telephone Encounter (Signed)
   [facility] Pre-operative Risk Assessment    Request for surgical clearance:   1. What type of surgery is being performed? Cataract extraction w/intraocular lens implantation left eye followed by the right eye   2. When is this surgery scheduled? 10/25/2017 11/16/2017   3. What type of clearance is required (medical clearance vs. Pharmacy clearance to hold med vs. Both)? Medical Clearance   4. Are there any medications that need to be held prior to surgery and how long? None Specified   5. Practice name and name of physician performing surgery? [facility], Dr. Christia Reading Bevis   6. What is your office phone and fax number? Phone: [phone] Fax: [phone]  7. Anesthesia type (None, local, MAC, general) ? Topical w/IV medication    Mendel Ryder 10/19/2017, 9:51 AM  _________________________________________________________________   (provider comments below)

## 2017-10-20 ENCOUNTER — Ambulatory Visit (INDEPENDENT_AMBULATORY_CARE_PROVIDER_SITE_OTHER): Payer: Medicare Other | Admitting: Physician Assistant

## 2017-10-20 ENCOUNTER — Encounter: Payer: Self-pay | Admitting: Physician Assistant

## 2017-10-20 VITALS — BP 140/68 | HR 84 | Ht 72.0 in | Wt 293.2 lb

## 2017-10-20 DIAGNOSIS — I10 Essential (primary) hypertension: Secondary | ICD-10-CM | POA: Diagnosis not present

## 2017-10-20 DIAGNOSIS — Z01818 Encounter for other preprocedural examination: Secondary | ICD-10-CM

## 2017-10-20 DIAGNOSIS — I2510 Atherosclerotic heart disease of native coronary artery without angina pectoris: Secondary | ICD-10-CM | POA: Diagnosis not present

## 2017-10-20 NOTE — Progress Notes (Signed)
Cardiology Office Note Date:  10/20/2017  Patient ID:  Hilliard, Borges 1941/04/29, MRN [id number] PCP:  [name], NP  Cardiologist:  Dr. Curt Bears   Chief Complaint: pre-op  History of Present Illness: Jacob Bishop is a 77 y.o. male with history of CAD (reported stents in 1990's and early 200's), HTN, DM, HLD, obesity, OSA w/CPAP.  He comes in today to be seen for Dr. Curt Bears, last seen by him Oct 2017.  At that visit was to re-establish cardiac care, was doing OK, mild SOB though not limiting and flet to be 2/2 obesity and deconditioning.  His statin was changed to simvastatin, no changes otherwise  He mentions his knees as his limiting factor, denies any kind of CP, palpitations,  He denies any rest SOB, he wears CPAP every night, no PND/orthopnea,.  He denies DOE with his ADLs, walks the stairs in his home with knee pain but not SOB.  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  No dizziness, near syncope or syncope.  He denies any changes to his exertional capacity over the last year or more.  RCRI score: is 6.6 Duke (DASI) score is 5.07   Past Medical History:  Diagnosis Date  . Arthritis   . CAD in native artery   . Cellulitis   . Dermatitis   . Diabetes mellitus    type II  . Edema   . History of kidney stones   . Hx of colonic polyps   . Hyperlipidemia   . Hypertension   . Obesity   . OSA (obstructive sleep apnea)    cpap  . Positional vertigo   . Rhinosinusitis   . Skin lesion     Past Surgical History:  Procedure Laterality Date  . CARDIAC CATHETERIZATION    . COLONOSCOPY    . CORONARY STENT PLACEMENT     3 stents /1997  . POLYPECTOMY      Current Outpatient Medications  Medication Sig Dispense Refill  . albuterol (PROVENTIL HFA;VENTOLIN HFA) 108 (90 Base) MCG/ACT inhaler Inhale 2 puffs into the lungs every 6 (six) hours as needed for wheezing or shortness of breath. 1 Inhaler 0  . amLODipine (NORVASC) 2.5 MG tablet  TAKE 1 TABLET BY MOUTH EVERY DAY 90 tablet 0  . aspirin 81 MG tablet Take 81 mg by mouth daily.      . BD PEN NEEDLE NANO U/F 32G X 4 MM MISC USE AS DIRECTED TWICE A DAY 200 each 0  . fluticasone (FLONASE) 50 MCG/ACT nasal spray Place 2 sprays into both nostrils daily as needed. 16 g 3  . glipiZIDE (GLUCOTROL) 5 MG tablet Take 1 tablet (5 mg total) by mouth 2 (two) times daily before a meal. 60 tablet 3  . Insulin Glargine (BASAGLAR KWIKPEN) 100 UNIT/ML SOPN Inject 0.4 mLs (40 Units total) into the skin every morning. And pen needles 1/day 5 pen 0  . meclizine (ANTIVERT) 12.5 MG tablet Take 1 tablet (12.5 mg total) by mouth 3 (three) times daily as needed for dizziness. 30 tablet 1  . SANTYL ointment OINTMENT TOPICAL APPLY TO WOUND WITH DRESSING CHANGES  0  . simvastatin (ZOCOR) 20 MG tablet TAKE 1 TABLET BY MOUTH EVERY DAY AT BEDTIME 90 tablet 3  . tamsulosin (FLOMAX) 0.4 MG CAPS capsule Take 0.4 mg by mouth daily.  11  . TOVIAZ 8 MG TB24 tablet Take 1 tablet by mouth daily.    Marland Kitchen VICTOZA 18 MG/3ML SOPN INJECT  1.2MG  UNDER THE SKIN EVERY DAY AS DIRECTED 15 mL 1   No current facility-administered medications for this visit.     Allergies:   Lipitor [atorvastatin calcium]   Social History:  The patient  reports that he quit smoking about 21 years ago. he has never used smokeless tobacco. He reports that he does not drink alcohol or use drugs.   Family History:  The patient's family history includes Cancer in his mother; Diabetes in his sister; Heart attack in his brother, father, and sister; Heart disease in his father and sister.  ROS:  Please see the history of present illness.  All other systems are reviewed and otherwise negative.   PHYSICAL EXAM:  VS:  Ht 6' (1.829 m)   BMI 40.28 kg/m  BMI: Body mass index is 40.28 kg/m. Well nourished, well developed, in no acute distress  HEENT: normocephalic, atraumatic  Neck: no JVD, carotid bruits or masses Cardiac:  RRR; no significant  murmurs, no rubs, or gallops Lungs:  CTA b/l, no wheezing, rhonchi or rales  Abd: soft, nontender MS: no deformity or atrophy Ext: chronic looking skin changes, + trace edema that is reported as chronic and unchanged for years  Skin: warm and dry, no rash Neuro:  No gross deficits appreciated Psych: euthymic mood, full affect   EKG:  Done today shows SR , PAC, unchanged from previous  Recent Labs: No results found for requested labs within last 8760 hours.  No results found for requested labs within last 8760 hours.   CrCl cannot be calculated (Patient's most recent lab result is older than the maximum 21 days allowed.).   Wt Readings from Last 3 Encounters:  10/08/17 297 lb (134.7 kg)  07/01/17 293 lb 9.6 oz (133.2 kg)  03/04/17 291 lb 9.6 oz (132.3 kg)     Other studies reviewed: Additional studies/records reviewed today include: summarized above  ASSESSMENT AND PLAN:  1. CAD     No anginal complaints     Reports some time since his last visit he was recommended to stop his ASA, denies having had any trouble with it, unsure why       On statin tx  Resume ECASA 81mg  daily after his cataract surgery when cleared by eye MD Will update his echo given has been a number of years  2. HTN     No changes today  3. HLD     Monitored and managed by his PCP  4. LE skin changes/edema     Looks of venous insufficiency     This is reported as chronic for many years and unchanged     Exertional capacity is unchanged and OK, knees being his limiting factor  5. Pre-op, left cataract surgery     No cardiac contraindication to cataract surgery    Disposition: F/u in 6 months for routine visit, sooner if needed  Current medicines are reviewed at length with the patient today.  The patient did not have any concerns regarding medicines.  Venetia Night, PA-C 10/20/2017 3:17 PM     [facility] [address] [place] [address] [phone]  (office)  [phone] (fax)

## 2017-10-20 NOTE — Patient Instructions (Addendum)
Medication Instructions:   Your physician recommends that you continue on your current medications as directed. Please refer to the Current Medication list given to you today.  RESUME ASPIRIN 81 MG AFTER SURGERY   If you need a refill on your cardiac medications before your next appointment, please call your pharmacy.  Labwork:  NONE ORDERED  TODAY   Testing/Procedures:  Your physician has requested that you have an echocardiogram. Echocardiography is a painless test that uses sound waves to create images of your heart. It provides your doctor with information about the size and shape of your heart and how well your heart's chambers and valves are working. This procedure takes approximately one hour. There are no restrictions for this procedure.   Follow-Up:  Your physician wants you to follow-up in:  IN  [place] . You will receive a reminder letter in the mail two months in advance. If you don't receive a letter, please call our office to schedule the follow-up appointment.     Any Other Special Instructions Will Be Listed Below (If Applicable).

## 2017-10-20 NOTE — Telephone Encounter (Signed)
Last yearly visit was 08/2016.  Should pt come in for yearly.  Seen recently for diabetes.

## 2017-10-20 NOTE — Telephone Encounter (Signed)
I told him to come back in 3 months for CPE. Ok to refill for 90 days

## 2017-10-21 DIAGNOSIS — E11622 Type 2 diabetes mellitus with other skin ulcer: Secondary | ICD-10-CM | POA: Diagnosis not present

## 2017-10-21 DIAGNOSIS — L98422 Non-pressure chronic ulcer of back with fat layer exposed: Secondary | ICD-10-CM | POA: Diagnosis not present

## 2017-10-21 DIAGNOSIS — I2510 Atherosclerotic heart disease of native coronary artery without angina pectoris: Secondary | ICD-10-CM | POA: Diagnosis not present

## 2017-10-21 DIAGNOSIS — F0390 Unspecified dementia without behavioral disturbance: Secondary | ICD-10-CM | POA: Diagnosis not present

## 2017-10-21 DIAGNOSIS — G4730 Sleep apnea, unspecified: Secondary | ICD-10-CM | POA: Diagnosis not present

## 2017-10-21 DIAGNOSIS — T8189XA Other complications of procedures, not elsewhere classified, initial encounter: Secondary | ICD-10-CM | POA: Diagnosis not present

## 2017-10-21 DIAGNOSIS — E1136 Type 2 diabetes mellitus with diabetic cataract: Secondary | ICD-10-CM | POA: Diagnosis not present

## 2017-10-21 NOTE — Telephone Encounter (Signed)
Sent to the pharmacy by e-scribe. 

## 2017-10-21 NOTE — Telephone Encounter (Signed)
Faxed to requesting party via EPIC

## 2017-10-21 NOTE — Telephone Encounter (Signed)
Follow up     Please fax surgical clearance today , patient is to have surgery on 10/23/17

## 2017-10-26 ENCOUNTER — Other Ambulatory Visit: Payer: Self-pay | Admitting: Adult Health

## 2017-10-26 ENCOUNTER — Encounter (HOSPITAL_BASED_OUTPATIENT_CLINIC_OR_DEPARTMENT_OTHER): Payer: Medicare Other | Attending: Internal Medicine

## 2017-10-26 DIAGNOSIS — H2512 Age-related nuclear cataract, left eye: Secondary | ICD-10-CM | POA: Diagnosis not present

## 2017-10-27 DIAGNOSIS — H2511 Age-related nuclear cataract, right eye: Secondary | ICD-10-CM | POA: Diagnosis not present

## 2017-11-02 ENCOUNTER — Encounter (HOSPITAL_BASED_OUTPATIENT_CLINIC_OR_DEPARTMENT_OTHER): Payer: Medicare Other | Attending: Internal Medicine

## 2017-11-02 DIAGNOSIS — I110 Hypertensive heart disease with heart failure: Secondary | ICD-10-CM | POA: Insufficient documentation

## 2017-11-02 DIAGNOSIS — Z6841 Body Mass Index (BMI) 40.0 and over, adult: Secondary | ICD-10-CM | POA: Insufficient documentation

## 2017-11-02 DIAGNOSIS — I509 Heart failure, unspecified: Secondary | ICD-10-CM | POA: Insufficient documentation

## 2017-11-02 DIAGNOSIS — E1151 Type 2 diabetes mellitus with diabetic peripheral angiopathy without gangrene: Secondary | ICD-10-CM | POA: Diagnosis not present

## 2017-11-02 DIAGNOSIS — T8189XA Other complications of procedures, not elsewhere classified, initial encounter: Secondary | ICD-10-CM | POA: Diagnosis not present

## 2017-11-02 DIAGNOSIS — E669 Obesity, unspecified: Secondary | ICD-10-CM | POA: Insufficient documentation

## 2017-11-02 DIAGNOSIS — L98423 Non-pressure chronic ulcer of back with necrosis of muscle: Secondary | ICD-10-CM | POA: Insufficient documentation

## 2017-11-02 DIAGNOSIS — M069 Rheumatoid arthritis, unspecified: Secondary | ICD-10-CM | POA: Insufficient documentation

## 2017-11-02 DIAGNOSIS — G4730 Sleep apnea, unspecified: Secondary | ICD-10-CM | POA: Diagnosis not present

## 2017-11-02 DIAGNOSIS — E11622 Type 2 diabetes mellitus with other skin ulcer: Secondary | ICD-10-CM | POA: Insufficient documentation

## 2017-11-02 DIAGNOSIS — I2510 Atherosclerotic heart disease of native coronary artery without angina pectoris: Secondary | ICD-10-CM | POA: Insufficient documentation

## 2017-11-02 DIAGNOSIS — F0390 Unspecified dementia without behavioral disturbance: Secondary | ICD-10-CM | POA: Insufficient documentation

## 2017-11-04 DIAGNOSIS — M25562 Pain in left knee: Secondary | ICD-10-CM | POA: Diagnosis not present

## 2017-11-04 DIAGNOSIS — R269 Unspecified abnormalities of gait and mobility: Secondary | ICD-10-CM | POA: Diagnosis not present

## 2017-11-04 DIAGNOSIS — M25561 Pain in right knee: Secondary | ICD-10-CM | POA: Diagnosis not present

## 2017-11-04 DIAGNOSIS — M170 Bilateral primary osteoarthritis of knee: Secondary | ICD-10-CM | POA: Diagnosis not present

## 2017-11-04 DIAGNOSIS — M21161 Varus deformity, not elsewhere classified, right knee: Secondary | ICD-10-CM | POA: Diagnosis not present

## 2017-11-04 DIAGNOSIS — M21162 Varus deformity, not elsewhere classified, left knee: Secondary | ICD-10-CM | POA: Diagnosis not present

## 2017-11-09 DIAGNOSIS — I509 Heart failure, unspecified: Secondary | ICD-10-CM | POA: Diagnosis not present

## 2017-11-09 DIAGNOSIS — I2510 Atherosclerotic heart disease of native coronary artery without angina pectoris: Secondary | ICD-10-CM | POA: Diagnosis not present

## 2017-11-09 DIAGNOSIS — T8189XA Other complications of procedures, not elsewhere classified, initial encounter: Secondary | ICD-10-CM | POA: Diagnosis not present

## 2017-11-09 DIAGNOSIS — I110 Hypertensive heart disease with heart failure: Secondary | ICD-10-CM | POA: Diagnosis not present

## 2017-11-09 DIAGNOSIS — E11622 Type 2 diabetes mellitus with other skin ulcer: Secondary | ICD-10-CM | POA: Diagnosis not present

## 2017-11-09 DIAGNOSIS — G4730 Sleep apnea, unspecified: Secondary | ICD-10-CM | POA: Diagnosis not present

## 2017-11-09 DIAGNOSIS — L98423 Non-pressure chronic ulcer of back with necrosis of muscle: Secondary | ICD-10-CM | POA: Diagnosis not present

## 2017-11-11 IMAGING — CT CT MAXILLOFACIAL W/O CM
3 of 5 series · 15 of 47 positions shown, 18 images · non-contrast
Comparison: None.

CLINICAL DATA: Fall 3 days ago with worsening periorbital bruising
and facial swelling. Abrasion right forehead and right hand.

EXAM:
CT HEAD WITHOUT CONTRAST
CT MAXILLOFACIAL WITHOUT CONTRAST
TECHNIQUE: Multidetector CT imaging of the head and maxillofacial structures
were performed using the standard protocol without intravenous
contrast. Multiplanar CT image reconstructions of the maxillofacial
structures were also generated.

[Series 5: facial st · axial · 0.37mm/px · z∈[-145,-1]mm · 9 of 84 slices shown, 12 images]
[im 6/84  brain]
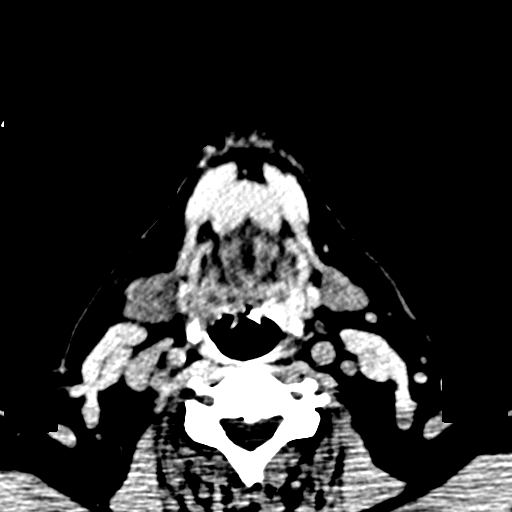
[im 6/84  bone]
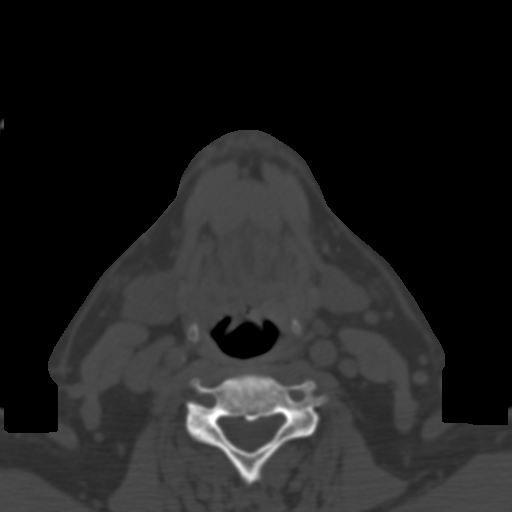
[im 18/84  bone]
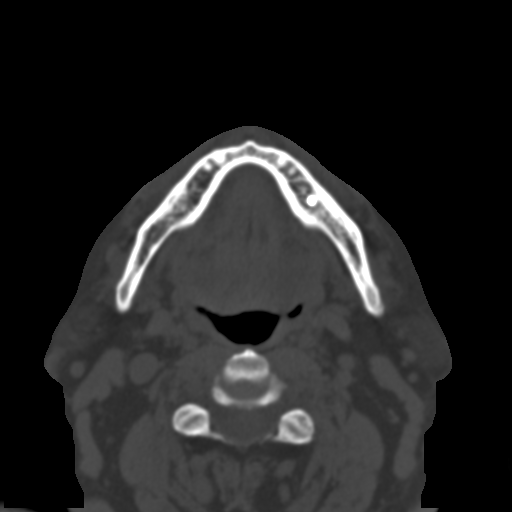
[im 24/84  bone]
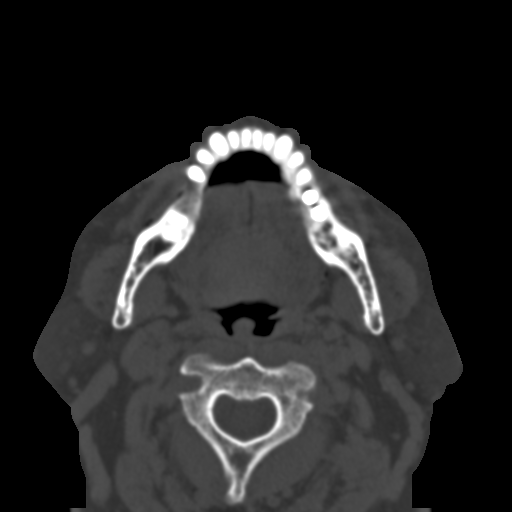
[im 36/84  bone]
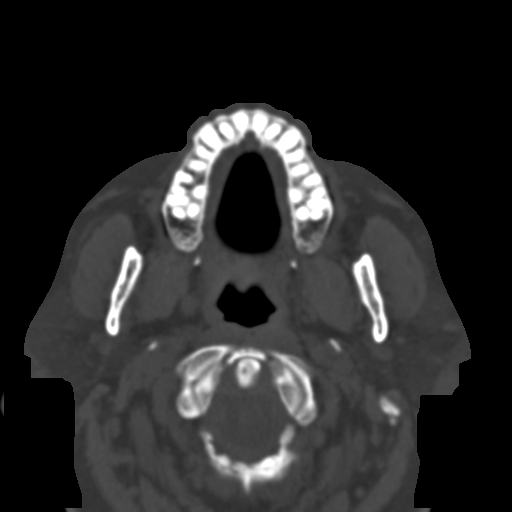
[im 42/84  brain]
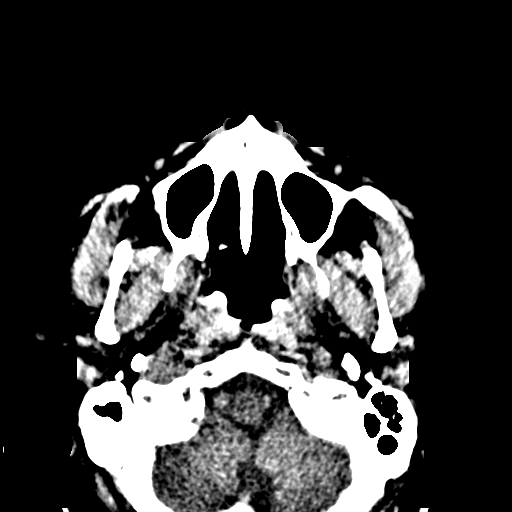
[im 42/84  bone]
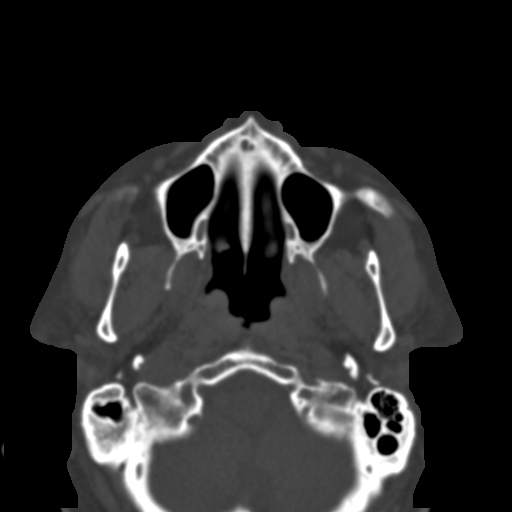
[im 48/84  bone]
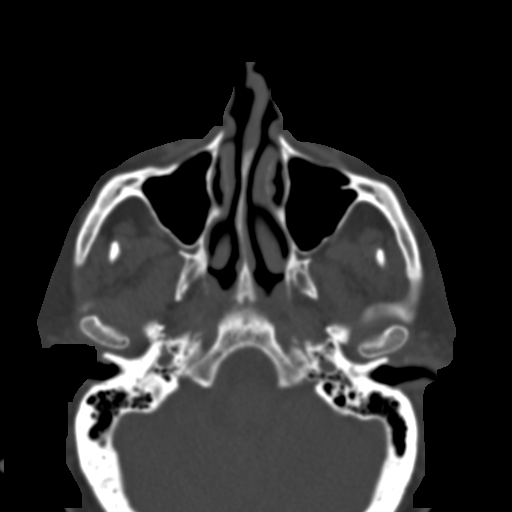
[im 60/84  bone]
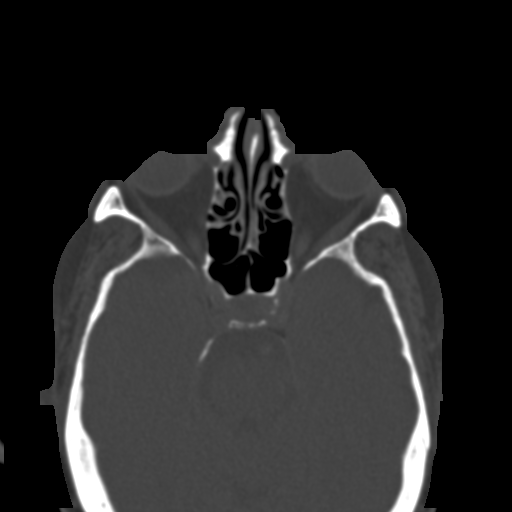
[im 66/84  bone]
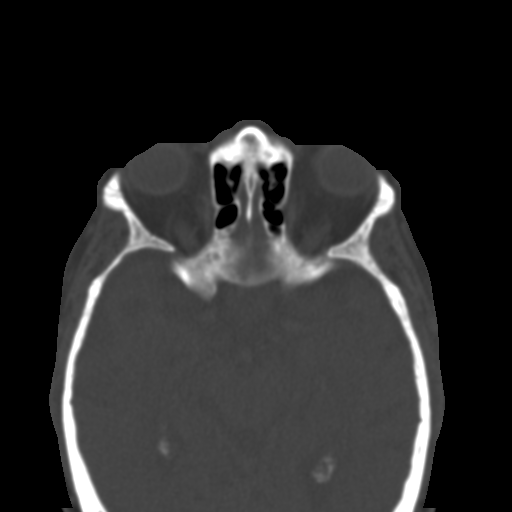
[im 78/84  brain]
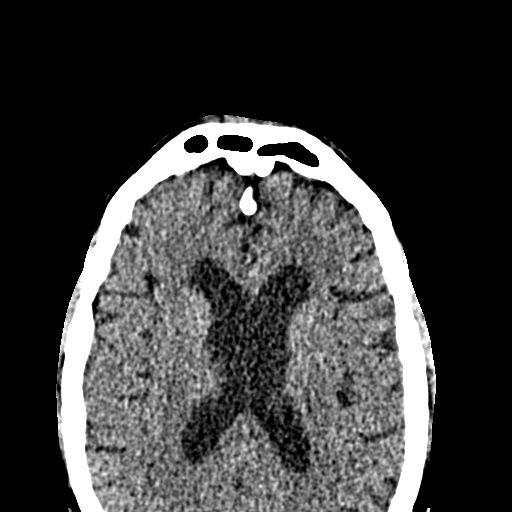
[im 78/84  bone]
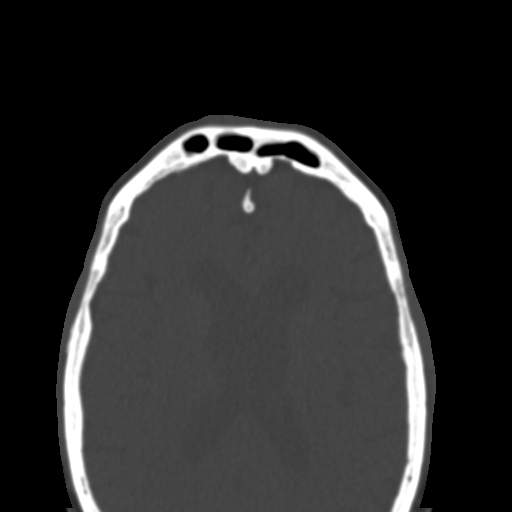

[Series 9: coronal st · coronal · 0.32mm/px · 3 of 91 slices shown]
[im 31/91  bone]
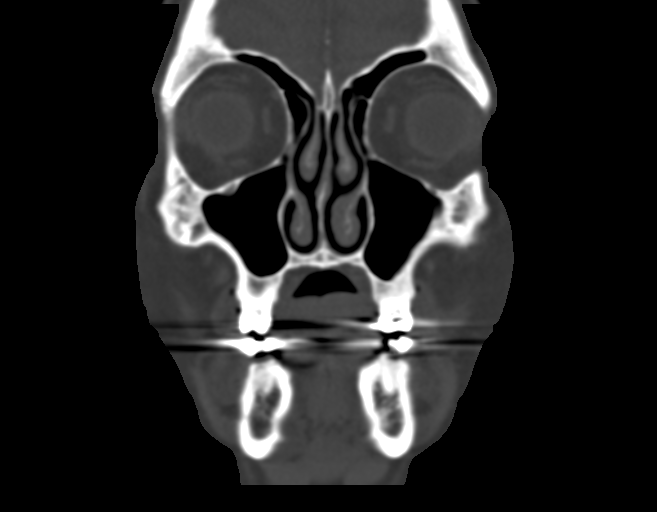
[im 41/91  bone]
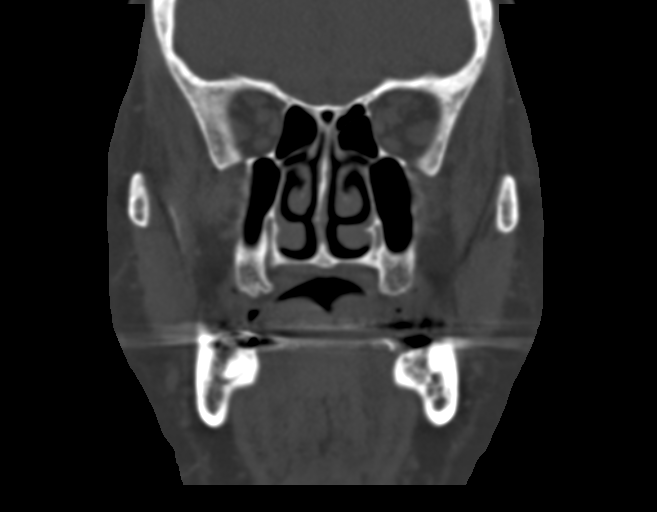
[im 51/91  bone]
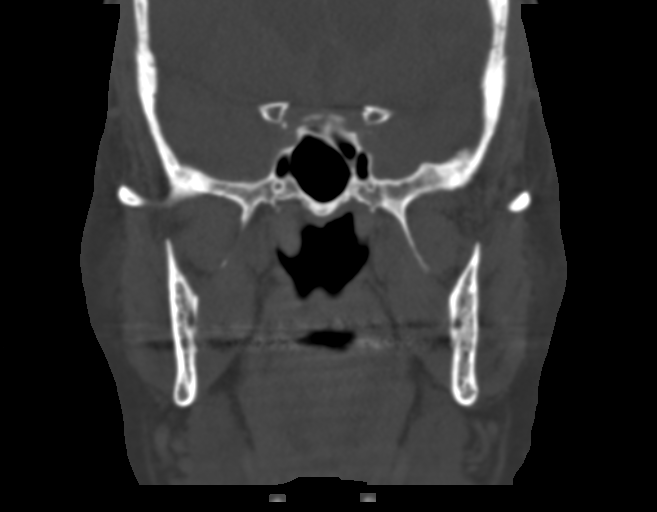

[Series 10: sagittal st · sagittal · 0.33mm/px · 3 of 100 slices shown]
[im 34/100  bone]
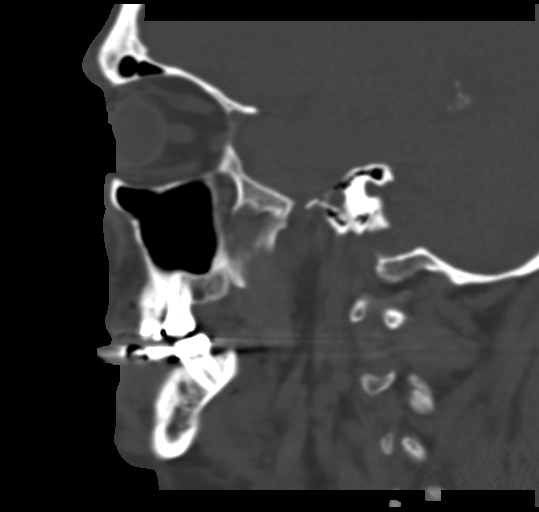
[im 50/100  bone]
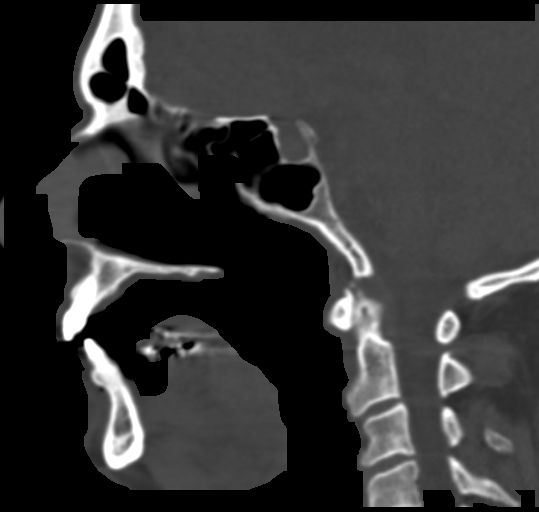
[im 67/100  bone]
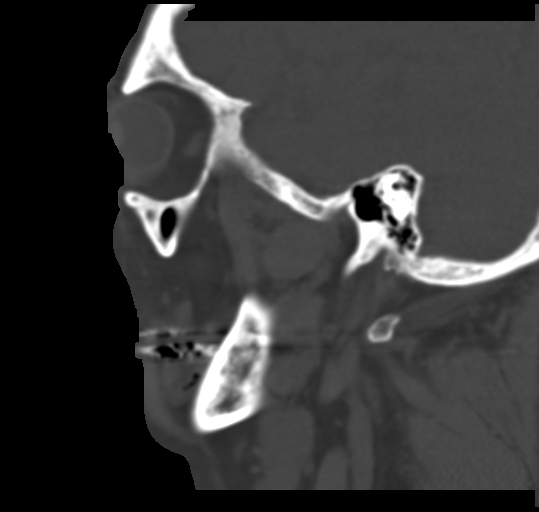

[15 of 47 positions shown; findings below may reference images not displayed]

FINDINGS: CT HEAD FINDINGS

Ventricles, cisterns and other CSF spaces are within normal. There
is no mass, mass effect, shift of midline structures or acute
hemorrhage. There is minimal chronic ischemic microvascular disease.
No evidence of acute infarction. Remaining bones and soft tissues
are within normal.

CT MAXILLOFACIAL FINDINGS

Orbits are normal symmetric. Paranasal sinuses are well aerated.
Mild deviation of the nasal septum to the right. No evidence of
acute fracture. No significant soft tissue injury. Remainder of the
exam is within normal.
IMPRESSION: No acute intracranial findings.

Minimal chronic ischemic microvascular disease.

No acute facial bone fracture.

## 2017-11-16 DIAGNOSIS — H2511 Age-related nuclear cataract, right eye: Secondary | ICD-10-CM | POA: Diagnosis not present

## 2017-11-18 DIAGNOSIS — G4730 Sleep apnea, unspecified: Secondary | ICD-10-CM | POA: Diagnosis not present

## 2017-11-18 DIAGNOSIS — L98422 Non-pressure chronic ulcer of back with fat layer exposed: Secondary | ICD-10-CM | POA: Diagnosis not present

## 2017-11-18 DIAGNOSIS — M25561 Pain in right knee: Secondary | ICD-10-CM | POA: Diagnosis not present

## 2017-11-18 DIAGNOSIS — I110 Hypertensive heart disease with heart failure: Secondary | ICD-10-CM | POA: Diagnosis not present

## 2017-11-18 DIAGNOSIS — L98423 Non-pressure chronic ulcer of back with necrosis of muscle: Secondary | ICD-10-CM | POA: Diagnosis not present

## 2017-11-18 DIAGNOSIS — I2510 Atherosclerotic heart disease of native coronary artery without angina pectoris: Secondary | ICD-10-CM | POA: Diagnosis not present

## 2017-11-18 DIAGNOSIS — M170 Bilateral primary osteoarthritis of knee: Secondary | ICD-10-CM | POA: Diagnosis not present

## 2017-11-18 DIAGNOSIS — M25562 Pain in left knee: Secondary | ICD-10-CM | POA: Diagnosis not present

## 2017-11-18 DIAGNOSIS — I509 Heart failure, unspecified: Secondary | ICD-10-CM | POA: Diagnosis not present

## 2017-11-18 DIAGNOSIS — E11622 Type 2 diabetes mellitus with other skin ulcer: Secondary | ICD-10-CM | POA: Diagnosis not present

## 2017-11-24 ENCOUNTER — Encounter (HOSPITAL_BASED_OUTPATIENT_CLINIC_OR_DEPARTMENT_OTHER): Payer: Medicare Other | Attending: Internal Medicine

## 2017-11-24 DIAGNOSIS — G4730 Sleep apnea, unspecified: Secondary | ICD-10-CM | POA: Diagnosis not present

## 2017-11-24 DIAGNOSIS — I2510 Atherosclerotic heart disease of native coronary artery without angina pectoris: Secondary | ICD-10-CM | POA: Insufficient documentation

## 2017-11-24 DIAGNOSIS — E11622 Type 2 diabetes mellitus with other skin ulcer: Secondary | ICD-10-CM | POA: Insufficient documentation

## 2017-11-24 DIAGNOSIS — L98422 Non-pressure chronic ulcer of back with fat layer exposed: Secondary | ICD-10-CM | POA: Insufficient documentation

## 2017-11-24 DIAGNOSIS — I509 Heart failure, unspecified: Secondary | ICD-10-CM | POA: Diagnosis not present

## 2017-11-24 DIAGNOSIS — F0390 Unspecified dementia without behavioral disturbance: Secondary | ICD-10-CM | POA: Diagnosis not present

## 2017-11-30 DIAGNOSIS — I2510 Atherosclerotic heart disease of native coronary artery without angina pectoris: Secondary | ICD-10-CM | POA: Diagnosis not present

## 2017-11-30 DIAGNOSIS — F0390 Unspecified dementia without behavioral disturbance: Secondary | ICD-10-CM | POA: Diagnosis not present

## 2017-11-30 DIAGNOSIS — L98422 Non-pressure chronic ulcer of back with fat layer exposed: Secondary | ICD-10-CM | POA: Diagnosis not present

## 2017-11-30 DIAGNOSIS — E11622 Type 2 diabetes mellitus with other skin ulcer: Secondary | ICD-10-CM | POA: Diagnosis not present

## 2017-11-30 DIAGNOSIS — G4730 Sleep apnea, unspecified: Secondary | ICD-10-CM | POA: Diagnosis not present

## 2017-11-30 DIAGNOSIS — I509 Heart failure, unspecified: Secondary | ICD-10-CM | POA: Diagnosis not present

## 2017-12-14 DIAGNOSIS — I509 Heart failure, unspecified: Secondary | ICD-10-CM | POA: Diagnosis not present

## 2017-12-14 DIAGNOSIS — F0390 Unspecified dementia without behavioral disturbance: Secondary | ICD-10-CM | POA: Diagnosis not present

## 2017-12-14 DIAGNOSIS — L98422 Non-pressure chronic ulcer of back with fat layer exposed: Secondary | ICD-10-CM | POA: Diagnosis not present

## 2017-12-14 DIAGNOSIS — I2510 Atherosclerotic heart disease of native coronary artery without angina pectoris: Secondary | ICD-10-CM | POA: Diagnosis not present

## 2017-12-14 DIAGNOSIS — G4730 Sleep apnea, unspecified: Secondary | ICD-10-CM | POA: Diagnosis not present

## 2017-12-14 DIAGNOSIS — E11622 Type 2 diabetes mellitus with other skin ulcer: Secondary | ICD-10-CM | POA: Diagnosis not present

## 2017-12-24 ENCOUNTER — Encounter: Payer: Self-pay | Admitting: Endocrinology

## 2017-12-24 LAB — HM DIABETES EYE EXAM

## 2017-12-28 ENCOUNTER — Encounter (HOSPITAL_BASED_OUTPATIENT_CLINIC_OR_DEPARTMENT_OTHER): Payer: Medicare Other | Attending: Internal Medicine

## 2017-12-28 DIAGNOSIS — F0390 Unspecified dementia without behavioral disturbance: Secondary | ICD-10-CM | POA: Diagnosis not present

## 2017-12-28 DIAGNOSIS — E11622 Type 2 diabetes mellitus with other skin ulcer: Secondary | ICD-10-CM | POA: Diagnosis not present

## 2017-12-28 DIAGNOSIS — L98422 Non-pressure chronic ulcer of back with fat layer exposed: Secondary | ICD-10-CM | POA: Diagnosis not present

## 2017-12-28 DIAGNOSIS — I2510 Atherosclerotic heart disease of native coronary artery without angina pectoris: Secondary | ICD-10-CM | POA: Diagnosis not present

## 2017-12-28 DIAGNOSIS — M069 Rheumatoid arthritis, unspecified: Secondary | ICD-10-CM | POA: Diagnosis not present

## 2017-12-28 DIAGNOSIS — E1151 Type 2 diabetes mellitus with diabetic peripheral angiopathy without gangrene: Secondary | ICD-10-CM | POA: Diagnosis not present

## 2017-12-28 DIAGNOSIS — G4730 Sleep apnea, unspecified: Secondary | ICD-10-CM | POA: Diagnosis not present

## 2017-12-28 DIAGNOSIS — I509 Heart failure, unspecified: Secondary | ICD-10-CM | POA: Diagnosis not present

## 2017-12-31 ENCOUNTER — Telehealth: Payer: Self-pay | Admitting: Adult Health

## 2017-12-31 NOTE — Telephone Encounter (Signed)
Left a message for a return call.

## 2017-12-31 NOTE — Telephone Encounter (Signed)
Copied from [place] [phone]. Topic: Quick Communication - See Telephone Encounter >> Dec 31, 2017  9:20 AM Vernona Rieger wrote: CRM for notification. See Telephone encounter for: 12/31/17.  Patient is requesting a mask for his Cpap machine. He said he is required to have a prescription. It needs to go to [facility] in [place]  Please advise.

## 2017-12-31 NOTE — Telephone Encounter (Signed)
Referral to pulmonology

## 2017-12-31 NOTE — Telephone Encounter (Signed)
yes

## 2018-01-05 ENCOUNTER — Ambulatory Visit (INDEPENDENT_AMBULATORY_CARE_PROVIDER_SITE_OTHER): Payer: Medicare Other | Admitting: Adult Health

## 2018-01-05 ENCOUNTER — Encounter: Payer: Self-pay | Admitting: Adult Health

## 2018-01-05 VITALS — BP 130/64 | Temp 98.1°F | Wt 288.0 lb

## 2018-01-05 DIAGNOSIS — E1165 Type 2 diabetes mellitus with hyperglycemia: Secondary | ICD-10-CM

## 2018-01-05 DIAGNOSIS — I2510 Atherosclerotic heart disease of native coronary artery without angina pectoris: Secondary | ICD-10-CM

## 2018-01-05 LAB — POCT GLYCOSYLATED HEMOGLOBIN (HGB A1C): HEMOGLOBIN A1C: 7.9

## 2018-01-05 MED ORDER — GLIPIZIDE ER 5 MG PO TB24: 5.0000 mg | ORAL_TABLET | Freq: Every day | ORAL | 0 refills | Status: DC

## 2018-01-05 NOTE — Progress Notes (Signed)
Subjective:    Patient ID: Jacob Bishop, male    DOB: 07-04-1941, 77 y.o.   MRN: [id number]  HPI  77 year old male who  has a past medical history of Arthritis, CAD in native artery, Cellulitis, Dermatitis, Diabetes mellitus, Edema, History of kidney stones, colonic polyps, Hyperlipidemia, Hypertension, Obesity, OSA (obstructive sleep apnea), Positional vertigo, Rhinosinusitis, and Skin lesion.  He presents to the office today for follow up regarding DM II - uncontrolled. He is currently prescribed Basaglar 40 units QHS, Victoza 1.2 mg and Glipizide 5 mg. He reports that he is not taking glipizide as he reports that he did not know that he needed to take it. His last A1c is 8. 2  He reports that he has been working on portion control and has been able to lose about 5 pounds over the last three months. Has not been exercising.   Wt Readings from Last 3 Encounters:  01/05/18 288 lb (130.6 kg)  10/20/17 293 lb 3.2 oz (133 kg)  10/08/17 297 lb (134.7 kg)   Review of Systems See HPI   Past Medical History:  Diagnosis Date  . Arthritis   . CAD in native artery   . Cellulitis   . Dermatitis   . Diabetes mellitus    type II  . Edema   . History of kidney stones   . Hx of colonic polyps   . Hyperlipidemia   . Hypertension   . Obesity   . OSA (obstructive sleep apnea)    cpap  . Positional vertigo   . Rhinosinusitis   . Skin lesion     Social History   Socioeconomic History  . Marital status: Married    Spouse name: Not on file  . Number of children: Not on file  . Years of education: Not on file  . Highest education level: Not on file  Occupational History  . Occupation: Financial controller, Health and safety inspector: [employer]  Social Needs  . Financial resource strain: Not on file  . Food insecurity:    Worry: Not on file    Inability: Not on file  . Transportation needs:    Medical: Not on file    Non-medical: Not on file  Tobacco Use  . Smoking status:  Former Smoker    Last attempt to quit: 01/10/1996    Years since quitting: 22.0  . Smokeless tobacco: Never Used  Substance and Sexual Activity  . Alcohol use: No  . Drug use: No  . Sexual activity: Not on file  Lifestyle  . Physical activity:    Days per week: Not on file    Minutes per session: Not on file  . Stress: Not on file  Relationships  . Social connections:    Talks on phone: Not on file    Gets together: Not on file    Attends religious service: Not on file    Active member of club or organization: Not on file    Attends meetings of clubs or organizations: Not on file    Relationship status: Not on file  . Intimate partner violence:    Fear of current or ex partner: Not on file    Emotionally abused: Not on file    Physically abused: Not on file    Forced sexual activity: Not on file  Other Topics Concern  . Not on file  Social History Narrative   Grew up in [place]. Mother was [place], Father [place].  He works daily - owns a [employer]    Married for 30+ years   Has a daughter who lives in [place]     Past Surgical History:  Procedure Laterality Date  . CARDIAC CATHETERIZATION    . COLONOSCOPY    . CORONARY STENT PLACEMENT     3 stents /1997  . POLYPECTOMY      Family History  Problem Relation Age of Onset  . Cancer Mother        ? lung cancer  . Heart disease Father   . Heart attack Father   . Heart disease Sister   . Diabetes Sister   . Heart attack Sister   . Heart attack Brother     Allergies  Allergen Reactions  . Lipitor [Atorvastatin Calcium] Other (See Comments)    Muscle soreness    Current Outpatient Medications on File Prior to Visit  Medication Sig Dispense Refill  . albuterol (PROVENTIL HFA;VENTOLIN HFA) 108 (90 Base) MCG/ACT inhaler Inhale 2 puffs into the lungs every 6 (six) hours as needed for wheezing or shortness of breath. 1 Inhaler 0  . amLODipine (NORVASC) 2.5 MG tablet TAKE 1 TABLET BY  MOUTH EVERY DAY 90 tablet 0  . aspirin 81 MG tablet Take 81 mg by mouth daily.      . BD PEN NEEDLE NANO U/F 32G X 4 MM MISC USE AS DIRECTED TWICE A DAY 200 each 0  . fluticasone (FLONASE) 50 MCG/ACT nasal spray Place 2 sprays into both nostrils daily as needed. 16 g 3  . Insulin Glargine (BASAGLAR KWIKPEN) 100 UNIT/ML SOPN Inject 0.4 mLs (40 Units total) into the skin every morning. And pen needles 1/day 5 pen 0  . meclizine (ANTIVERT) 12.5 MG tablet Take 1 tablet (12.5 mg total) by mouth 3 (three) times daily as needed for dizziness. 30 tablet 1  . SANTYL ointment OINTMENT TOPICAL APPLY TO WOUND WITH DRESSING CHANGES  0  . simvastatin (ZOCOR) 20 MG tablet TAKE 1 TABLET BY MOUTH EVERY DAY AT BEDTIME 90 tablet 3  . tamsulosin (FLOMAX) 0.4 MG CAPS capsule Take 0.4 mg by mouth daily.  11  . TOVIAZ 8 MG TB24 tablet Take 1 tablet by mouth daily.    Marland Kitchen VICTOZA 18 MG/3ML SOPN INJECT 1.2MG  UNDER THE SKIN EVERY DAY AS DIRECTED 15 mL 1   No current facility-administered medications on file prior to visit.     BP 130/64   Temp 98.1 F (36.7 C) (Oral)   Wt 288 lb (130.6 kg)   BMI 39.06 kg/m       Objective:   Physical Exam  Constitutional: He is oriented to person, place, and time. He appears well-developed and well-nourished. No distress.  Cardiovascular: Normal rate, regular rhythm, normal heart sounds and intact distal pulses. Exam reveals no gallop and no friction rub.  No murmur heard. Pulmonary/Chest: Effort normal and breath sounds normal.  Neurological: He is alert and oriented to person, place, and time.  Skin: Skin is warm and dry. Capillary refill takes less than 2 seconds. He is not diaphoretic.  Psychiatric: He has a normal mood and affect. His behavior is normal. Judgment and thought content normal.  Nursing note and vitals reviewed.     Assessment & Plan:  1. Uncontrolled type 2 diabetes mellitus with hyperglycemia (HCC)  - POCT A1C- has improved 7.9  - continue to work on  diet and be active  - Follow up in 3 months for CPE  -  Take glipizide 5 mg XR   [name], NP

## 2018-01-05 NOTE — Telephone Encounter (Signed)
Pt seen here in the office today for follow up of A1C.  Pt reports he no longer needs help obtaining an cpap mask.  One has been sent to him by mail.  No further action required.

## 2018-01-07 DIAGNOSIS — N200 Calculus of kidney: Secondary | ICD-10-CM | POA: Diagnosis not present

## 2018-01-07 DIAGNOSIS — N401 Enlarged prostate with lower urinary tract symptoms: Secondary | ICD-10-CM | POA: Diagnosis not present

## 2018-01-07 DIAGNOSIS — R350 Frequency of micturition: Secondary | ICD-10-CM | POA: Diagnosis not present

## 2018-01-07 DIAGNOSIS — R3911 Hesitancy of micturition: Secondary | ICD-10-CM | POA: Diagnosis not present

## 2018-01-11 ENCOUNTER — Other Ambulatory Visit: Payer: Self-pay

## 2018-01-11 ENCOUNTER — Ambulatory Visit (HOSPITAL_COMMUNITY): Payer: Medicare Other | Attending: Cardiology

## 2018-01-11 DIAGNOSIS — G4733 Obstructive sleep apnea (adult) (pediatric): Secondary | ICD-10-CM | POA: Insufficient documentation

## 2018-01-11 DIAGNOSIS — I5189 Other ill-defined heart diseases: Secondary | ICD-10-CM

## 2018-01-11 DIAGNOSIS — I2510 Atherosclerotic heart disease of native coronary artery without angina pectoris: Secondary | ICD-10-CM | POA: Diagnosis not present

## 2018-01-11 DIAGNOSIS — R609 Edema, unspecified: Secondary | ICD-10-CM | POA: Diagnosis not present

## 2018-01-11 DIAGNOSIS — E119 Type 2 diabetes mellitus without complications: Secondary | ICD-10-CM | POA: Insufficient documentation

## 2018-01-11 DIAGNOSIS — E785 Hyperlipidemia, unspecified: Secondary | ICD-10-CM | POA: Diagnosis not present

## 2018-01-11 DIAGNOSIS — Z87891 Personal history of nicotine dependence: Secondary | ICD-10-CM | POA: Insufficient documentation

## 2018-01-11 DIAGNOSIS — I509 Heart failure, unspecified: Secondary | ICD-10-CM | POA: Diagnosis not present

## 2018-01-11 DIAGNOSIS — I119 Hypertensive heart disease without heart failure: Secondary | ICD-10-CM | POA: Insufficient documentation

## 2018-01-11 DIAGNOSIS — G4730 Sleep apnea, unspecified: Secondary | ICD-10-CM | POA: Diagnosis not present

## 2018-01-11 DIAGNOSIS — I071 Rheumatic tricuspid insufficiency: Secondary | ICD-10-CM

## 2018-01-11 DIAGNOSIS — L98422 Non-pressure chronic ulcer of back with fat layer exposed: Secondary | ICD-10-CM | POA: Diagnosis not present

## 2018-01-11 DIAGNOSIS — L97422 Non-pressure chronic ulcer of left heel and midfoot with fat layer exposed: Secondary | ICD-10-CM | POA: Diagnosis not present

## 2018-01-11 DIAGNOSIS — I272 Pulmonary hypertension, unspecified: Secondary | ICD-10-CM

## 2018-01-11 DIAGNOSIS — E1151 Type 2 diabetes mellitus with diabetic peripheral angiopathy without gangrene: Secondary | ICD-10-CM | POA: Diagnosis not present

## 2018-01-11 DIAGNOSIS — I351 Nonrheumatic aortic (valve) insufficiency: Secondary | ICD-10-CM

## 2018-01-11 DIAGNOSIS — E11622 Type 2 diabetes mellitus with other skin ulcer: Secondary | ICD-10-CM | POA: Diagnosis not present

## 2018-01-11 HISTORY — DX: Pulmonary hypertension, unspecified: I27.20

## 2018-01-11 HISTORY — DX: Other ill-defined heart diseases: I51.89

## 2018-01-11 HISTORY — DX: Rheumatic tricuspid insufficiency: I07.1

## 2018-01-11 HISTORY — DX: Nonrheumatic aortic (valve) insufficiency: I35.1

## 2018-01-13 ENCOUNTER — Other Ambulatory Visit: Payer: Self-pay | Admitting: Adult Health

## 2018-01-14 NOTE — Telephone Encounter (Signed)
Sent to the pharmacy by e-scribe. 

## 2018-01-15 ENCOUNTER — Encounter: Payer: Self-pay | Admitting: Family Medicine

## 2018-01-25 ENCOUNTER — Encounter (HOSPITAL_BASED_OUTPATIENT_CLINIC_OR_DEPARTMENT_OTHER): Payer: Medicare Other | Attending: Internal Medicine

## 2018-01-25 DIAGNOSIS — F0390 Unspecified dementia without behavioral disturbance: Secondary | ICD-10-CM | POA: Diagnosis not present

## 2018-01-25 DIAGNOSIS — I509 Heart failure, unspecified: Secondary | ICD-10-CM | POA: Insufficient documentation

## 2018-01-25 DIAGNOSIS — I2510 Atherosclerotic heart disease of native coronary artery without angina pectoris: Secondary | ICD-10-CM | POA: Diagnosis not present

## 2018-01-25 DIAGNOSIS — E11622 Type 2 diabetes mellitus with other skin ulcer: Secondary | ICD-10-CM | POA: Insufficient documentation

## 2018-01-25 DIAGNOSIS — G4730 Sleep apnea, unspecified: Secondary | ICD-10-CM | POA: Insufficient documentation

## 2018-01-25 DIAGNOSIS — L98422 Non-pressure chronic ulcer of back with fat layer exposed: Secondary | ICD-10-CM | POA: Diagnosis not present

## 2018-01-27 DIAGNOSIS — M170 Bilateral primary osteoarthritis of knee: Secondary | ICD-10-CM | POA: Diagnosis not present

## 2018-01-27 DIAGNOSIS — M25561 Pain in right knee: Secondary | ICD-10-CM | POA: Diagnosis not present

## 2018-01-27 DIAGNOSIS — M25562 Pain in left knee: Secondary | ICD-10-CM | POA: Diagnosis not present

## 2018-01-27 DIAGNOSIS — M1711 Unilateral primary osteoarthritis, right knee: Secondary | ICD-10-CM | POA: Diagnosis not present

## 2018-02-03 DIAGNOSIS — M25561 Pain in right knee: Secondary | ICD-10-CM | POA: Diagnosis not present

## 2018-02-03 DIAGNOSIS — M1711 Unilateral primary osteoarthritis, right knee: Secondary | ICD-10-CM | POA: Diagnosis not present

## 2018-02-04 ENCOUNTER — Telehealth: Payer: Self-pay | Admitting: Family Medicine

## 2018-02-04 NOTE — Telephone Encounter (Signed)
Denyse Amass from [facility] notified that the orders for cpap will need to come from pulmonology.  No further action required.

## 2018-02-04 NOTE — Telephone Encounter (Signed)
Copied from [place] [phone]. Topic: General - Other >> Feb 03, 2018  4:14 PM Yvette Rack wrote: Reason for CRM: Anderson Malta with [facility] states a fax was sent on 01/01/18 requesting the medical necessity for cpap supply but they have not received a response. Anderson Malta states she will re-fax the request.

## 2018-02-09 DIAGNOSIS — L98422 Non-pressure chronic ulcer of back with fat layer exposed: Secondary | ICD-10-CM | POA: Diagnosis not present

## 2018-02-09 DIAGNOSIS — I509 Heart failure, unspecified: Secondary | ICD-10-CM | POA: Diagnosis not present

## 2018-02-09 DIAGNOSIS — G4730 Sleep apnea, unspecified: Secondary | ICD-10-CM | POA: Diagnosis not present

## 2018-02-09 DIAGNOSIS — F0390 Unspecified dementia without behavioral disturbance: Secondary | ICD-10-CM | POA: Diagnosis not present

## 2018-02-09 DIAGNOSIS — I2510 Atherosclerotic heart disease of native coronary artery without angina pectoris: Secondary | ICD-10-CM | POA: Diagnosis not present

## 2018-02-09 DIAGNOSIS — E11622 Type 2 diabetes mellitus with other skin ulcer: Secondary | ICD-10-CM | POA: Diagnosis not present

## 2018-02-10 DIAGNOSIS — M25561 Pain in right knee: Secondary | ICD-10-CM | POA: Diagnosis not present

## 2018-02-10 DIAGNOSIS — M1711 Unilateral primary osteoarthritis, right knee: Secondary | ICD-10-CM | POA: Diagnosis not present

## 2018-02-22 ENCOUNTER — Encounter (HOSPITAL_BASED_OUTPATIENT_CLINIC_OR_DEPARTMENT_OTHER): Payer: Medicare Other | Attending: Internal Medicine

## 2018-02-22 DIAGNOSIS — L98423 Non-pressure chronic ulcer of back with necrosis of muscle: Secondary | ICD-10-CM | POA: Diagnosis not present

## 2018-02-22 DIAGNOSIS — I2510 Atherosclerotic heart disease of native coronary artery without angina pectoris: Secondary | ICD-10-CM | POA: Diagnosis not present

## 2018-02-22 DIAGNOSIS — F0390 Unspecified dementia without behavioral disturbance: Secondary | ICD-10-CM | POA: Insufficient documentation

## 2018-02-22 DIAGNOSIS — E11622 Type 2 diabetes mellitus with other skin ulcer: Secondary | ICD-10-CM | POA: Diagnosis not present

## 2018-02-22 DIAGNOSIS — G4730 Sleep apnea, unspecified: Secondary | ICD-10-CM | POA: Insufficient documentation

## 2018-02-22 DIAGNOSIS — I509 Heart failure, unspecified: Secondary | ICD-10-CM | POA: Insufficient documentation

## 2018-02-22 DIAGNOSIS — M25561 Pain in right knee: Secondary | ICD-10-CM | POA: Diagnosis not present

## 2018-02-22 DIAGNOSIS — L98429 Non-pressure chronic ulcer of back with unspecified severity: Secondary | ICD-10-CM | POA: Insufficient documentation

## 2018-02-22 DIAGNOSIS — M1711 Unilateral primary osteoarthritis, right knee: Secondary | ICD-10-CM | POA: Diagnosis not present

## 2018-02-22 DIAGNOSIS — I110 Hypertensive heart disease with heart failure: Secondary | ICD-10-CM | POA: Insufficient documentation

## 2018-03-01 DIAGNOSIS — M25562 Pain in left knee: Secondary | ICD-10-CM | POA: Diagnosis not present

## 2018-03-01 DIAGNOSIS — M1712 Unilateral primary osteoarthritis, left knee: Secondary | ICD-10-CM | POA: Diagnosis not present

## 2018-03-09 DIAGNOSIS — I2510 Atherosclerotic heart disease of native coronary artery without angina pectoris: Secondary | ICD-10-CM | POA: Diagnosis not present

## 2018-03-09 DIAGNOSIS — E11622 Type 2 diabetes mellitus with other skin ulcer: Secondary | ICD-10-CM | POA: Diagnosis not present

## 2018-03-09 DIAGNOSIS — I110 Hypertensive heart disease with heart failure: Secondary | ICD-10-CM | POA: Diagnosis not present

## 2018-03-09 DIAGNOSIS — L98429 Non-pressure chronic ulcer of back with unspecified severity: Secondary | ICD-10-CM | POA: Diagnosis not present

## 2018-03-09 DIAGNOSIS — I509 Heart failure, unspecified: Secondary | ICD-10-CM | POA: Diagnosis not present

## 2018-03-09 DIAGNOSIS — L98422 Non-pressure chronic ulcer of back with fat layer exposed: Secondary | ICD-10-CM | POA: Diagnosis not present

## 2018-03-09 DIAGNOSIS — G4730 Sleep apnea, unspecified: Secondary | ICD-10-CM | POA: Diagnosis not present

## 2018-03-11 DIAGNOSIS — M25561 Pain in right knee: Secondary | ICD-10-CM | POA: Diagnosis not present

## 2018-03-11 DIAGNOSIS — M170 Bilateral primary osteoarthritis of knee: Secondary | ICD-10-CM | POA: Diagnosis not present

## 2018-03-11 DIAGNOSIS — M25562 Pain in left knee: Secondary | ICD-10-CM | POA: Diagnosis not present

## 2018-03-12 ENCOUNTER — Telehealth: Payer: Self-pay

## 2018-03-12 NOTE — Telephone Encounter (Signed)
   [facility] Pre-operative Risk Assessment    Request for surgical clearance:  1. What type of surgery is being performed? TKA   2. When is this surgery scheduled? 04/19/2018   3. What type of clearance is required (medical clearance vs. Pharmacy clearance to hold med vs. Both)? Both  4. Are there any medications that need to be held prior to surgery and how long? none   5. Practice name and name of physician performing surgery? [facility]   6. What is your office phone [phone]    7.   What is your office fax number [phone]  8.   Anesthesia type (None, local, MAC, general) ? General   Dollene Primrose 03/12/2018, 1:48 PM  _________________________________________________________________   (provider comments below)

## 2018-03-17 NOTE — Telephone Encounter (Signed)
   Primary Cardiologist: Will Meredith Leeds, MD  Chart reviewed as part of pre-operative protocol coverage. Patient was contacted 03/17/2018 in reference to pre-operative risk assessment for pending surgery as outlined below.  Colman Birdwell was last seen on 10/20/2017 by Tommye Standard, PA-C.  Since that day, Benedetto Ryder has done well.  He has chronic dyspnea on exertion, no recent change.  No lower extremity edema, no orthopnea or PND.  No chest pain.  When he was seen in February, this was preop for cataract surgery.   RCRI score: is 6.6 Duke (DASI) score is 5.07 The scores were calculated at his office visit in February and have not changed.  Therefore, based on ACC/AHA guidelines, the patient would be at increased but acceptable risk for the planned procedure without further cardiovascular testing.   I will route this recommendation to the requesting party via Epic fax function and remove from pre-op pool.  Please call with questions.  Rosaria Ferries, PA-C 03/17/2018, 2:48 PM  Dr. Maureen Ralphs, [phone], [phone]

## 2018-03-23 DIAGNOSIS — L98429 Non-pressure chronic ulcer of back with unspecified severity: Secondary | ICD-10-CM | POA: Diagnosis not present

## 2018-03-23 DIAGNOSIS — L98421 Non-pressure chronic ulcer of back limited to breakdown of skin: Secondary | ICD-10-CM | POA: Diagnosis not present

## 2018-03-23 DIAGNOSIS — E11622 Type 2 diabetes mellitus with other skin ulcer: Secondary | ICD-10-CM | POA: Diagnosis not present

## 2018-03-23 DIAGNOSIS — G4730 Sleep apnea, unspecified: Secondary | ICD-10-CM | POA: Diagnosis not present

## 2018-03-23 DIAGNOSIS — I2510 Atherosclerotic heart disease of native coronary artery without angina pectoris: Secondary | ICD-10-CM | POA: Diagnosis not present

## 2018-03-23 DIAGNOSIS — I110 Hypertensive heart disease with heart failure: Secondary | ICD-10-CM | POA: Diagnosis not present

## 2018-03-23 DIAGNOSIS — I509 Heart failure, unspecified: Secondary | ICD-10-CM | POA: Diagnosis not present

## 2018-03-23 DIAGNOSIS — T8189XA Other complications of procedures, not elsewhere classified, initial encounter: Secondary | ICD-10-CM | POA: Diagnosis not present

## 2018-03-25 ENCOUNTER — Encounter: Payer: Self-pay | Admitting: Adult Health

## 2018-03-25 ENCOUNTER — Ambulatory Visit (INDEPENDENT_AMBULATORY_CARE_PROVIDER_SITE_OTHER): Payer: Medicare Other | Admitting: Adult Health

## 2018-03-25 ENCOUNTER — Ambulatory Visit (INDEPENDENT_AMBULATORY_CARE_PROVIDER_SITE_OTHER): Payer: Medicare Other

## 2018-03-25 VITALS — BP 120/56 | Temp 98.5°F | Wt 291.0 lb

## 2018-03-25 DIAGNOSIS — E1165 Type 2 diabetes mellitus with hyperglycemia: Secondary | ICD-10-CM

## 2018-03-25 DIAGNOSIS — R079 Chest pain, unspecified: Secondary | ICD-10-CM

## 2018-03-25 DIAGNOSIS — I2510 Atherosclerotic heart disease of native coronary artery without angina pectoris: Secondary | ICD-10-CM | POA: Diagnosis not present

## 2018-03-25 DIAGNOSIS — Z01818 Encounter for other preprocedural examination: Secondary | ICD-10-CM

## 2018-03-25 DIAGNOSIS — R0600 Dyspnea, unspecified: Secondary | ICD-10-CM | POA: Diagnosis not present

## 2018-03-25 DIAGNOSIS — R05 Cough: Secondary | ICD-10-CM | POA: Diagnosis not present

## 2018-03-25 LAB — CBC WITH DIFFERENTIAL/PLATELET
Basophils Absolute: 0 10*3/uL (ref 0.0–0.1)
Basophils Relative: 0.3 % (ref 0.0–3.0)
Eosinophils Absolute: 0.1 10*3/uL (ref 0.0–0.7)
Eosinophils Relative: 2.5 % (ref 0.0–5.0)
HCT: 42 % (ref 39.0–52.0)
Hemoglobin: 14.5 g/dL (ref 13.0–17.0)
Lymphocytes Relative: 35.5 % (ref 12.0–46.0)
Lymphs Abs: 1.5 10*3/uL (ref 0.7–4.0)
MCHC: 34.5 g/dL (ref 30.0–36.0)
MCV: 94.5 fl (ref 78.0–100.0)
Monocytes Absolute: 0.5 10*3/uL (ref 0.1–1.0)
Monocytes Relative: 11 % (ref 3.0–12.0)
Neutro Abs: 2.1 10*3/uL (ref 1.4–7.7)
Neutrophils Relative %: 50.7 % (ref 43.0–77.0)
Platelets: 96 10*3/uL — ABNORMAL LOW (ref 150.0–400.0)
RBC: 4.45 Mil/uL (ref 4.22–5.81)
RDW: 13.9 % (ref 11.5–15.5)
WBC: 4.2 10*3/uL (ref 4.0–10.5)

## 2018-03-25 LAB — COMPREHENSIVE METABOLIC PANEL
ALT: 46 U/L (ref 0–53)
AST: 29 U/L (ref 0–37)
Albumin: 4.2 g/dL (ref 3.5–5.2)
Alkaline Phosphatase: 74 U/L (ref 39–117)
BUN: 16 mg/dL (ref 6–23)
CHLORIDE: 106 meq/L (ref 96–112)
CO2: 27 mEq/L (ref 19–32)
Calcium: 9.2 mg/dL (ref 8.4–10.5)
Creatinine, Ser: 0.81 mg/dL (ref 0.40–1.50)
GFR: 98.83 mL/min (ref >60.00)
GLUCOSE: 238 mg/dL — AB (ref 70–99)
POTASSIUM: 4.1 meq/L (ref 3.5–5.1)
SODIUM: 142 meq/L (ref 135–145)
Total Bilirubin: 1.3 mg/dL — ABNORMAL HIGH (ref 0.2–1.2)
Total Protein: 6.8 g/dL (ref 6.0–8.3)

## 2018-03-25 LAB — POCT URINALYSIS DIPSTICK
Bilirubin, UA: NEGATIVE
Glucose, UA: POSITIVE — AB
Ketones, UA: NEGATIVE
Leukocytes, UA: NEGATIVE
Nitrite, UA: NEGATIVE
Protein, UA: POSITIVE — AB
Spec Grav, UA: >=1.03 — AB
Urobilinogen, UA: 0.2 U/dL
pH, UA: 5.5

## 2018-03-25 LAB — LIPID PANEL
Cholesterol: 144 mg/dL (ref 0–200)
HDL: 36.8 mg/dL — ABNORMAL LOW
LDL Cholesterol: 75 mg/dL (ref 0–99)
NonHDL: 106.93
Total CHOL/HDL Ratio: 4
Triglycerides: 161 mg/dL — ABNORMAL HIGH (ref 0.0–149.0)
VLDL: 32.2 mg/dL (ref 0.0–40.0)

## 2018-03-25 LAB — HEMOGLOBIN A1C: Hgb A1c MFr Bld: 7.8 % — ABNORMAL HIGH (ref 4.6–6.5)

## 2018-03-25 NOTE — Progress Notes (Signed)
Subjective:    Patient ID: Jacob Bishop, male    DOB: [birth year], 77 y.o.   MRN: [id number]  HPI  77 year old male who  has a past medical history of Arthritis, CAD in native artery, Cellulitis, Dermatitis, Diabetes mellitus, Edema, History of kidney stones, colonic polyps, Hyperlipidemia, Hypertension, Obesity, OSA (obstructive sleep apnea), Positional vertigo, Rhinosinusitis, and Skin lesion.  He presents to the office today for surgical clearance. His plan is to have Dr. Wynelle Link perform a right total knee replacement.   Today in the office he reports that over the last 3-4 weeks he has been experiencing  Daily left sided chest discomfort with radiating pain at times into the left side of his neck. His discomfort is described as a feeling of "gas" but is "sharp" at times. For the same amount of time he has been experiencing SOB with rest and with exertion. He finds it difficult to climb a flight of stairs without having to stop for rest. He has a history of CAD with stent placement in the 1990's and 2000's. Today he states " it feels like the same type of pain when I needed to have some stents placed. He denies any left arm pain  He was last seen by Cardiology in February of this year and had an echo done in May 2019 with an impression of:   - Normal LV systolic function; mild diastolic dysfunction; trace   AI; trace TR with mild pulmonary hypertension.  Review of Systems  Constitutional: Positive for activity change.  HENT: Negative.   Eyes: Negative.   Respiratory: Positive for shortness of breath. Negative for cough, chest tightness and wheezing.   Cardiovascular: Positive for chest pain. Negative for palpitations and leg swelling.  Gastrointestinal: Negative.   Endocrine: Negative.   Genitourinary: Negative.   Musculoskeletal: Positive for arthralgias, back pain and gait problem.  Skin: Negative.   Psychiatric/Behavioral: Negative.   All other systems reviewed and are  negative.  Past Medical History:  Diagnosis Date  . Arthritis   . CAD in native artery   . Cellulitis   . Dermatitis   . Diabetes mellitus    type II  . Edema   . History of kidney stones   . Hx of colonic polyps   . Hyperlipidemia   . Hypertension   . Obesity   . OSA (obstructive sleep apnea)    cpap  . Positional vertigo   . Rhinosinusitis   . Skin lesion     Social History   Socioeconomic History  . Marital status: Married    Spouse name: Not on file  . Number of children: Not on file  . Years of education: Not on file  . Highest education level: Not on file  Occupational History  . Occupation: Financial controller, Health and safety inspector: [employer]  Social Needs  . Financial resource strain: Not on file  . Food insecurity:    Worry: Not on file    Inability: Not on file  . Transportation needs:    Medical: Not on file    Non-medical: Not on file  Tobacco Use  . Smoking status: Former Smoker    Last attempt to quit: 01/10/1996    Years since quitting: 22.2  . Smokeless tobacco: Never Used  Substance and Sexual Activity  . Alcohol use: No  . Drug use: No  . Sexual activity: Not on file  Lifestyle  . Physical activity:    Days per week:  Not on file    Minutes per session: Not on file  . Stress: Not on file  Relationships  . Social connections:    Talks on phone: Not on file    Gets together: Not on file    Attends religious service: Not on file    Active member of club or organization: Not on file    Attends meetings of clubs or organizations: Not on file    Relationship status: Not on file  . Intimate partner violence:    Fear of current or ex partner: Not on file    Emotionally abused: Not on file    Physically abused: Not on file    Forced sexual activity: Not on file  Other Topics Concern  . Not on file  Social History Narrative   Grew up in [place]. Mother was [place], Father [place].   He works daily - owns a [employer]    Married for 30+ years   Has a daughter who lives in [place]     Past Surgical History:  Procedure Laterality Date  . CARDIAC CATHETERIZATION    . COLONOSCOPY    . CORONARY STENT PLACEMENT     3 stents /1997  . POLYPECTOMY      Family History  Problem Relation Age of Onset  . Cancer Mother        ? lung cancer  . Heart disease Father   . Heart attack Father   . Heart disease Sister   . Diabetes Sister   . Heart attack Sister   . Heart attack Brother     Allergies  Allergen Reactions  . Lipitor [Atorvastatin Calcium] Other (See Comments)    Muscle soreness    Current Outpatient Medications on File Prior to Visit  Medication Sig Dispense Refill  . albuterol (PROVENTIL HFA;VENTOLIN HFA) 108 (90 Base) MCG/ACT inhaler Inhale 2 puffs into the lungs every 6 (six) hours as needed for wheezing or shortness of breath. 1 Inhaler 0  . amLODipine (NORVASC) 2.5 MG tablet TAKE 1 TABLET BY MOUTH EVERY DAY 90 tablet 0  . aspirin 81 MG tablet Take 81 mg by mouth daily.      . BD PEN NEEDLE NANO U/F 32G X 4 MM MISC USE AS DIRECTED TWICE A DAY 200 each 0  . fluticasone (FLONASE) 50 MCG/ACT nasal spray Place 2 sprays into both nostrils daily as needed. 16 g 3  . glipiZIDE (GLUCOTROL XL) 5 MG 24 hr tablet Take 1 tablet (5 mg total) by mouth daily with breakfast. 90 tablet 0  . Insulin Glargine (BASAGLAR KWIKPEN) 100 UNIT/ML SOPN Inject 0.4 mLs (40 Units total) into the skin every morning. And pen needles 1/day 5 pen 0  . meclizine (ANTIVERT) 12.5 MG tablet Take 1 tablet (12.5 mg total) by mouth 3 (three) times daily as needed for dizziness. 30 tablet 1  . SANTYL ointment OINTMENT TOPICAL APPLY TO WOUND WITH DRESSING CHANGES  0  . simvastatin (ZOCOR) 20 MG tablet TAKE 1 TABLET BY MOUTH EVERY DAY AT BEDTIME 90 tablet 3  . tamsulosin (FLOMAX) 0.4 MG CAPS capsule Take 0.4 mg by mouth daily.  11  . TOVIAZ 8 MG TB24 tablet Take 1 tablet by mouth daily.    Marland Kitchen VICTOZA 18 MG/3ML SOPN  INJECT 1.2MG  UNDER THE SKIN EVERY DAY AS DIRECTED 15 mL 1   No current facility-administered medications on file prior to visit.     BP (!) 120/56   Temp  98.5 F (36.9 C) (Oral)   Wt 291 lb (132 kg)   BMI 39.47 kg/m       Objective:   Physical Exam  Constitutional: He appears well-developed and well-nourished. No distress.  Eyes: Pupils are equal, round, and reactive to light. EOM are normal.  Neck: Normal range of motion. Neck supple.  Cardiovascular: Normal rate, regular rhythm, normal heart sounds and intact distal pulses. Exam reveals no gallop and no friction rub.  No murmur heard. Pulmonary/Chest: Effort normal and breath sounds normal. No stridor. No respiratory distress. He has no wheezes. He has no rales. He exhibits no tenderness.  Abdominal: Soft. Bowel sounds are normal.  Musculoskeletal: He exhibits tenderness.  Walks with a cane.  Has slow steady gait  Skin: Skin is warm and dry. He is not diaphoretic.  Psychiatric: He has a normal mood and affect. His behavior is normal. Judgment and thought content normal.  Nursing note and vitals reviewed.     Assessment & Plan:  1. Pre-operative clearance -Due to symptoms of chest discomfort shortness of breath, I do not feel comfortable clearing this patient for surgery at this time.  I am going to touch base with his cardiology team let them know what is going on.  He does have a follow-up appointment with cardiology in approximately 3 weeks. - Hemoglobin A1c - CBC with Differential/Platelet - Comprehensive metabolic panel - DG Chest 2 View; Future - POC Urinalysis Dipstick - EKG 12-Lead- Sinus  Rhythm  - frequent PAC s  # PACs = 2. WITHIN NORMAL LIMITS, rate 85 - Lipid panel  2. Uncontrolled type 2 diabetes mellitus with hyperglycemia ([place]) - consider adding agent  - Hemoglobin A1c - Lipid panel  3. Chest pain, unspecified type  - CBC with Differential/Platelet - Comprehensive metabolic panel - DG Chest 2 View;  Future - EKG 12-Lead - Lipid panel  4. Dyspnea, unspecified type  - CBC with Differential/Platelet - Comprehensive metabolic panel - DG Chest 2 View; Future - EKG 12-Lead - Lipid panel  [name], NP

## 2018-03-26 ENCOUNTER — Other Ambulatory Visit: Payer: Self-pay | Admitting: Family Medicine

## 2018-03-26 DIAGNOSIS — R0600 Dyspnea, unspecified: Secondary | ICD-10-CM

## 2018-03-26 MED ORDER — GLIPIZIDE ER 10 MG PO TB24: 10.0000 mg | ORAL_TABLET | Freq: Every day | ORAL | 0 refills | Status: DC

## 2018-03-30 ENCOUNTER — Other Ambulatory Visit: Payer: Self-pay | Admitting: Orthopedic Surgery

## 2018-03-30 NOTE — H&P (Signed)
TOTAL KNEE ADMISSION H&P  Patient is being admitted for right total knee arthroplasty.  Subjective:  Chief Complaint:right knee pain.  HPI: Ashkan Chamberland, 77 y.o. male, has a history of pain and functional disability in the right knee due to arthritis and has failed non-surgical conservative treatments for greater than 12 weeks to includecorticosteriod injections, viscosupplementation injections and activity modification.  Onset of symptoms was gradual, starting several years ago with gradually worsening course since that time. The patient noted no past surgery on the right knee(s).  Patient currently rates pain in the right knee(s) at 8 out of 10 with activity. Patient has worsening of pain with activity and weight bearing, crepitus and instability.  Patient has evidence of medial and patellofemoral bone-on-bone arthritis with slight varus deformity by imaging studies. There is no active infection.  Patient Active Problem List   Diagnosis Date Noted  . Osteoarthritis, hand 05/05/2014  . Carpal tunnel syndrome 05/02/2014  . Eczema 01/23/2014  . Thrombocytopenia, unspecified ([place]) 11/19/2012  . Overactive bladder 08/24/2012  . Osteoarthritis of right knee 01/22/2012  . Benign positional vertigo 07/09/2011  . CAD, NATIVE VESSEL 10/22/2009  . EDEMA 08/03/2009  . Obstructive sleep apnea 05/03/2009  . SKIN LESION 09/23/2007  . Diabetes mellitus type 2, uncontrolled ([place]) 03/23/2007  . Hyperlipidemia 03/23/2007  . Essential hypertension 03/23/2007  . COLONIC POLYPS, HX OF 03/23/2007  . Morbid obesity ([place]) 03/23/2007   Past Medical History:  Diagnosis Date  . Arthritis   . CAD in native artery   . Cellulitis   . Dermatitis   . Diabetes mellitus    type II  . Edema   . History of kidney stones   . Hx of colonic polyps   . Hyperlipidemia   . Hypertension   . Obesity   . OSA (obstructive sleep apnea)    cpap  . Positional vertigo   . Rhinosinusitis   . Skin lesion     Past  Surgical History:  Procedure Laterality Date  . CARDIAC CATHETERIZATION    . COLONOSCOPY    . CORONARY STENT PLACEMENT     3 stents /1997  . POLYPECTOMY      No current facility-administered medications for this encounter.    Current Outpatient Medications  Medication Sig Dispense Refill Last Dose  . albuterol (PROVENTIL HFA;VENTOLIN HFA) 108 (90 Base) MCG/ACT inhaler Inhale 2 puffs into the lungs every 6 (six) hours as needed for wheezing or shortness of breath. 1 Inhaler 0 Taking  . amLODipine (NORVASC) 2.5 MG tablet TAKE 1 TABLET BY MOUTH EVERY DAY 90 tablet 0 Taking  . aspirin 81 MG tablet Take 81 mg by mouth daily.     Taking  . BD PEN NEEDLE NANO U/F 32G X 4 MM MISC USE AS DIRECTED TWICE A DAY 200 each 0 Taking  . fluticasone (FLONASE) 50 MCG/ACT nasal spray Place 2 sprays into both nostrils daily as needed. 16 g 3 Taking  . glipiZIDE (GLUCOTROL XL) 10 MG 24 hr tablet Take 1 tablet (10 mg total) by mouth daily with breakfast. 90 tablet 0   . Insulin Glargine (BASAGLAR KWIKPEN) 100 UNIT/ML SOPN Inject 0.4 mLs (40 Units total) into the skin every morning. And pen needles 1/day 5 pen 0 Taking  . meclizine (ANTIVERT) 12.5 MG tablet Take 1 tablet (12.5 mg total) by mouth 3 (three) times daily as needed for dizziness. 30 tablet 1 Taking  . SANTYL ointment OINTMENT TOPICAL APPLY TO WOUND WITH DRESSING CHANGES  0 Taking  .  simvastatin (ZOCOR) 20 MG tablet TAKE 1 TABLET BY MOUTH EVERY DAY AT BEDTIME 90 tablet 3 Taking  . tamsulosin (FLOMAX) 0.4 MG CAPS capsule Take 0.4 mg by mouth daily.  11 Taking  . TOVIAZ 8 MG TB24 tablet Take 1 tablet by mouth daily.   Taking  . VICTOZA 18 MG/3ML SOPN INJECT 1.2MG  UNDER THE SKIN EVERY DAY AS DIRECTED 15 mL 1 Taking   Allergies  Allergen Reactions  . Lipitor [Atorvastatin Calcium] Other (See Comments)    Muscle soreness    Social History   Tobacco Use  . Smoking status: Former Smoker    Last attempt to quit: 01/10/1996    Years since quitting: 22.2   . Smokeless tobacco: Never Used  Substance Use Topics  . Alcohol use: No    Family History  Problem Relation Age of Onset  . Cancer Mother        ? lung cancer  . Heart disease Father   . Heart attack Father   . Heart disease Sister   . Diabetes Sister   . Heart attack Sister   . Heart attack Brother      Review of Systems  Constitutional: Negative for chills and fever.  HENT: Negative for congestion, sore throat and tinnitus.   Eyes: Negative for double vision, photophobia and pain.  Respiratory: Negative for cough, shortness of breath and wheezing.   Cardiovascular: Negative for chest pain, palpitations and orthopnea.  Gastrointestinal: Negative for heartburn, nausea and vomiting.  Genitourinary: Negative for dysuria, frequency and urgency.  Musculoskeletal: Positive for joint pain.  Neurological: Negative for dizziness, weakness and headaches.  Psychiatric/Behavioral: Negative for depression.    Objective:  Physical Exam  Morbidly obese and well developed. General: Alert and oriented x3, cooperative and pleasant, no acute distress. Head: normocephalic, atraumatic, neck supple. Eyes: EOMI. Respiratory: breath sounds clear in all fields, no wheezing, rales, or rhonchi. Cardiovascular: Regular rate and rhythm, no murmurs, gallops or rubs.  Abdomen: non-tender to palpation and soft, normoactive bowel sounds. Musculoskeletal: Right Hip Exam: flexion to 120 rotation in 30 abduction 40 and external rotation of 40. There is no tenderness over the greater trochanter. There is no pain on provocative testing of the hip. Left Hip Exam: flexion to 120 rotation in 30 out 40 and abduction 40 without discomfort. There is no tenderness over the greater trochanter. There is no pain on provocative testing of the hip. Left Knee Exam: no effusion. Range 5-125 with moderate crepitus. There is medial joint line tenderness with no instability. Right Knee Exam: varus deformity. Range 10-120  with marked crepitus . He is tender medial greater than lateral with no instability. Gait pattern is significantly antalgic on the right Calves soft and nontender. Motor function intact in LE. Strength 5/5 LE bilaterally. Neuro: Distal pulses 2+. Sensation to light touch intact in LE.  Vital signs in last 24 hours: Blood pressure: 138/66 mmHg Pulse 88 bpm  Labs:   Estimated body mass index is 39.47 kg/m as calculated from the following:   Height as of 10/20/17: 6' (1.829 m).   Weight as of 03/25/18: 132 kg (291 lb).   Imaging Review Plain radiographs demonstrate severe degenerative joint disease of the right knee(s). The overall alignment ismild varus. The bone quality appears to be adequate for age and reported activity level.   Preoperative templating of the joint replacement has been completed, documented, and submitted to the Operating Room personnel in order to optimize intra-operative equipment management.   Anticipated LOS  equal to or greater than 2 midnights due to - Age 56 and older with one or more of the following:  - Obesity  - Expected need for hospital services (PT, OT, Nursing) required for safe  discharge  - Anticipated need for postoperative skilled nursing care or inpatient rehab  - Active co-morbidities: Diabetes OR   - Unanticipated findings during/Post Surgery: None  - Patient is a high risk of re-admission due to: None     Assessment/Plan:  End stage arthritis, right knee   The patient history, physical examination, clinical judgment of the provider and imaging studies are consistent with end stage degenerative joint disease of the right knee(s) and total knee arthroplasty is deemed medically necessary. The treatment options including medical management, injection therapy arthroscopy and arthroplasty were discussed at length. The risks and benefits of total knee arthroplasty were presented and reviewed. The risks due to aseptic loosening, infection,  stiffness, patella tracking problems, thromboembolic complications and other imponderables were discussed. The patient acknowledged the explanation, agreed to proceed with the plan and consent was signed. Patient is being admitted for inpatient treatment for surgery, pain control, PT, OT, prophylactic antibiotics, VTE prophylaxis, progressive ambulation and ADL's and discharge planning. The patient is planning to be discharged home with outpatient physical therapy.   Therapy Plans: outpatient therapy at [facility] Disposition: Home with wife Planned DVT Prophylaxis: aspirin 325mg  BID DME needed: 3-n-1 PCP: [name], NP Cardiologist: Rosaria Ferries, PA-C (cardiac clearance provided) TXA: IV Allergies: None Other: Patient's HgA1c elevated as of 03/25/2018 at 7.8, ordered HgA1c to be measured with pre-operative labs.  Referral for physical therapy and prescription for 3-n-1 provided by Leonides Grills, RN.  - Patient was instructed on what medications to stop prior to surgery. - Follow-up visit in 2 weeks with Dr. Wynelle Link - Begin physical therapy following surgery - Pre-operative lab work as pre-surgical testing - Prescriptions will be provided in hospital at time of discharge  Theresa Duty, PA-C Orthopedic Surgery [facility] [place]

## 2018-03-30 NOTE — Care Plan (Signed)
R TKA scheduled on 04-19-18 DCP:  Home with spouse.  2 story home with 3 ste. DME:  No needs.  Has a SW and Rx for 3-in-1 provided at H&P appt PT:  [facility].  PT eval scheduled on 04-23-18.

## 2018-03-31 ENCOUNTER — Institutional Professional Consult (permissible substitution): Payer: Medicare Other | Admitting: Pulmonary Disease

## 2018-03-31 DIAGNOSIS — M1711 Unilateral primary osteoarthritis, right knee: Secondary | ICD-10-CM | POA: Diagnosis not present

## 2018-04-06 ENCOUNTER — Encounter: Payer: Self-pay | Admitting: Pulmonary Disease

## 2018-04-06 ENCOUNTER — Ambulatory Visit (INDEPENDENT_AMBULATORY_CARE_PROVIDER_SITE_OTHER): Payer: Medicare Other | Admitting: Pulmonary Disease

## 2018-04-06 VITALS — BP 122/76 | HR 86 | Ht 72.0 in | Wt 287.0 lb

## 2018-04-06 DIAGNOSIS — G4733 Obstructive sleep apnea (adult) (pediatric): Secondary | ICD-10-CM

## 2018-04-06 DIAGNOSIS — I2510 Atherosclerotic heart disease of native coronary artery without angina pectoris: Secondary | ICD-10-CM

## 2018-04-06 DIAGNOSIS — R0600 Dyspnea, unspecified: Secondary | ICD-10-CM

## 2018-04-06 DIAGNOSIS — I2729 Other secondary pulmonary hypertension: Secondary | ICD-10-CM

## 2018-04-06 NOTE — Assessment & Plan Note (Signed)
perioperative precautions as would be usual for OSA We will try to obtain his baseline sleep study and pressure settings from [facility]. He should be placed on CPAP while recovering from anesthesia and postoperative period

## 2018-04-06 NOTE — Progress Notes (Signed)
   Subjective:    Patient ID: Jacob Bishop, male    DOB: 03-May-1941, 77 y.o.   MRN: [id number]  HPI  77 year old remote smoker presents for evaluation of dyspnea on exertion. He underwent preop examination by his PCP for a right total knee replacement that is now planned in September 2019. He ambulates with a cane.  He reports dyspnea which is not necessarily related to exertion.  He could just be sitting still and feels shortness of breath and this seems to subside spontaneously.  He sees cardiology for CAD status post stents and is maintained on aspirin.  He is an insulin requiring diabetic and his last HbA1c was 7.8.  Echo showed normal LV function with mild pulmonary hypertension with RVSP of 41. He has a sedentary lifestyle and admits to gaining weight over the past few years. Chest x-ray 8/1 was personally reviewed and this shows mild pleural-parenchymal scarring especially at the bases.  On review this seems to be unchanged compared to an old x-ray from 11/2013  he denies wheezing, frequent chest colds or cough  He has been diagnosed with OSA for which he uses a CPAP machine and is very compliant.  Do not have baseline study at this point or his settings  He smoked about a pack per day for 30 years before he quit in 1997, 30 pack years.  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 He lived in [place] before moving to [place] more than 20 years ago  Significant tests/ events reviewed  Echo 12/2017 nml LVSF, mild PH  Spirometry was a poor effort, ratio of 83, FEV1 of 62%, FVC 54%  Past Medical History:  Diagnosis Date  . Arthritis   . CAD in native artery   . Cellulitis   . Dermatitis   . Diabetes mellitus    type II  . Edema   . History of kidney stones   . Hx of colonic polyps   . Hyperlipidemia   . Hypertension   . Obesity   . OSA (obstructive sleep apnea)    cpap  . Positional vertigo   . Rhinosinusitis   . Skin lesion      Review of Systems Positive for shortness  of breath with activity  Constitutional: negative for anorexia, fevers and sweats  Eyes: negative for irritation, redness and visual disturbance  Ears, nose, mouth, throat, and face: negative for earaches, epistaxis, nasal congestion and sore throat  Respiratory: negative for cough,sputum and wheezing  Cardiovascular: negative for chest pain, , orthopnea, palpitations and syncope  Gastrointestinal: negative for abdominal pain, constipation, diarrhea, melena, nausea and vomiting  Genitourinary:negative for dysuria, frequency and hematuria  Hematologic/lymphatic: negative for bleeding, easy bruising and lymphadenopathy  Musculoskeletal:negative for arthralgias, muscle weakness and stiff joints  Neurological: negative for coordination problems, , headaches and weakness  Endocrine: negative for diabetic symptoms including polydipsia, polyuria and weight loss     Objective:   Physical Exam   Gen. Pleasant, obese, in no distress, normal affect ENT - no post nasal drip, class 2 airway Neck: No JVD, no thyromegaly, no carotid bruits Lungs: no use of accessory muscles, no dullness to percussion, decreased without rales or rhonchi  Cardiovascular: Rhythm irregular, heart sounds  normal, no murmurs or gallops, 1+ peripheral edema Abdomen: soft and non-tender, no hepatosplenomegaly, BS normal. Musculoskeletal: No deformities, no cyanosis or clubbing Neuro:  alert, non focal, no tremors        Assessment & Plan:

## 2018-04-06 NOTE — Patient Instructions (Signed)
Cleared for surgery from a lung standpoint

## 2018-04-06 NOTE — Assessment & Plan Note (Addendum)
He has mild pulmonary hypertension which is likely secondary to diastolic dysfunction related to obesity and diabetes.  Spirometry mostly shows moderate restriction although this was a poor effort and no evidence of obstructive lung disease.  I doubt that he has any kind of interstitial lung disease Pulmonary hypertension is mild and does not preclude surgery. Clearly weight reduction would be the most important intervention for him.  He does not need bronchodilators. At this point he would be cleared from surgery from a pulmonary standpoint

## 2018-04-08 ENCOUNTER — Other Ambulatory Visit: Payer: Self-pay | Admitting: Adult Health

## 2018-04-08 ENCOUNTER — Ambulatory Visit: Payer: Medicare Other | Admitting: Adult Health

## 2018-04-08 DIAGNOSIS — H8113 Benign paroxysmal vertigo, bilateral: Secondary | ICD-10-CM

## 2018-04-12 ENCOUNTER — Encounter (HOSPITAL_BASED_OUTPATIENT_CLINIC_OR_DEPARTMENT_OTHER): Payer: Medicare Other | Attending: Internal Medicine

## 2018-04-12 DIAGNOSIS — E119 Type 2 diabetes mellitus without complications: Secondary | ICD-10-CM | POA: Insufficient documentation

## 2018-04-12 DIAGNOSIS — G4730 Sleep apnea, unspecified: Secondary | ICD-10-CM | POA: Insufficient documentation

## 2018-04-12 DIAGNOSIS — Z09 Encounter for follow-up examination after completed treatment for conditions other than malignant neoplasm: Secondary | ICD-10-CM | POA: Insufficient documentation

## 2018-04-12 DIAGNOSIS — F0390 Unspecified dementia without behavioral disturbance: Secondary | ICD-10-CM | POA: Insufficient documentation

## 2018-04-12 DIAGNOSIS — I509 Heart failure, unspecified: Secondary | ICD-10-CM | POA: Insufficient documentation

## 2018-04-12 DIAGNOSIS — Z872 Personal history of diseases of the skin and subcutaneous tissue: Secondary | ICD-10-CM | POA: Insufficient documentation

## 2018-04-12 DIAGNOSIS — I2510 Atherosclerotic heart disease of native coronary artery without angina pectoris: Secondary | ICD-10-CM | POA: Insufficient documentation

## 2018-04-12 DIAGNOSIS — Z794 Long term (current) use of insulin: Secondary | ICD-10-CM | POA: Insufficient documentation

## 2018-04-14 ENCOUNTER — Other Ambulatory Visit: Payer: Self-pay | Admitting: Adult Health

## 2018-04-14 ENCOUNTER — Ambulatory Visit: Payer: Medicare Other | Admitting: Physician Assistant

## 2018-04-21 ENCOUNTER — Encounter: Payer: Self-pay | Admitting: Physician Assistant

## 2018-04-21 DIAGNOSIS — L98423 Non-pressure chronic ulcer of back with necrosis of muscle: Secondary | ICD-10-CM | POA: Diagnosis not present

## 2018-04-21 DIAGNOSIS — E119 Type 2 diabetes mellitus without complications: Secondary | ICD-10-CM | POA: Diagnosis not present

## 2018-04-21 DIAGNOSIS — Z09 Encounter for follow-up examination after completed treatment for conditions other than malignant neoplasm: Secondary | ICD-10-CM | POA: Diagnosis not present

## 2018-04-21 DIAGNOSIS — F0390 Unspecified dementia without behavioral disturbance: Secondary | ICD-10-CM | POA: Diagnosis not present

## 2018-04-21 DIAGNOSIS — Z872 Personal history of diseases of the skin and subcutaneous tissue: Secondary | ICD-10-CM | POA: Diagnosis not present

## 2018-04-21 DIAGNOSIS — G4730 Sleep apnea, unspecified: Secondary | ICD-10-CM | POA: Diagnosis not present

## 2018-04-21 DIAGNOSIS — Z794 Long term (current) use of insulin: Secondary | ICD-10-CM | POA: Diagnosis not present

## 2018-04-21 DIAGNOSIS — I509 Heart failure, unspecified: Secondary | ICD-10-CM | POA: Diagnosis not present

## 2018-04-21 DIAGNOSIS — I2510 Atherosclerotic heart disease of native coronary artery without angina pectoris: Secondary | ICD-10-CM | POA: Diagnosis not present

## 2018-04-28 ENCOUNTER — Ambulatory Visit: Payer: Medicare Other | Admitting: Physician Assistant

## 2018-05-24 ENCOUNTER — Ambulatory Visit (INDEPENDENT_AMBULATORY_CARE_PROVIDER_SITE_OTHER): Payer: Medicare Other | Admitting: Physician Assistant

## 2018-05-24 ENCOUNTER — Encounter: Payer: Self-pay | Admitting: *Deleted

## 2018-05-24 ENCOUNTER — Encounter: Payer: Self-pay | Admitting: Physician Assistant

## 2018-05-24 VITALS — BP 144/66 | HR 77 | Ht 71.0 in | Wt 292.0 lb

## 2018-05-24 DIAGNOSIS — R0789 Other chest pain: Secondary | ICD-10-CM

## 2018-05-24 DIAGNOSIS — R0602 Shortness of breath: Secondary | ICD-10-CM | POA: Diagnosis not present

## 2018-05-24 DIAGNOSIS — E785 Hyperlipidemia, unspecified: Secondary | ICD-10-CM | POA: Diagnosis not present

## 2018-05-24 DIAGNOSIS — Z01810 Encounter for preprocedural cardiovascular examination: Secondary | ICD-10-CM | POA: Diagnosis not present

## 2018-05-24 DIAGNOSIS — I10 Essential (primary) hypertension: Secondary | ICD-10-CM

## 2018-05-24 DIAGNOSIS — I2510 Atherosclerotic heart disease of native coronary artery without angina pectoris: Secondary | ICD-10-CM

## 2018-05-24 NOTE — Patient Instructions (Addendum)
Medication Instructions:   Your physician recommends that you continue on your current medications as directed. Please refer to the Current Medication list given to you today.   If you need a refill on your cardiac medications before your next appointment, please call your pharmacy.  Labwork:  BMET MAG AND BNP  TODAY    Testing/Procedures: Your physician has requested that you have a lexiscan myoview. For further information please visit [email or web address]. Please follow instruction sheet, as given.   Follow-Up: Your physician wants you to follow-up in:  [place] will receive a reminder letter in the mail two months in advance. If you don't receive a letter, please call our office to schedule the follow-up appointment.      Any Other Special Instructions Will Be Listed Below (If Applicable).

## 2018-05-24 NOTE — Progress Notes (Signed)
Cardiology Office Note:    Date:  05/24/2018   ID:  Jacob Bishop [birth year], MRN [id number]  PCP:  Jacob Peng, NP  Cardiologist/Electrophysiologist:  Jacob Haw, MD    Referring MD: Jacob Peng, NP   Chief Complaint  Patient presents with  . Surgical Clearance  . Chest Pain     History of Present Illness:    Jacob Bishop is a 77 y.o. male with coronary artery disease s/p prior PCI to the RCA in 1997 and Cypher DES to the LAD in 2004, diabetes, hypertension, hyperlipidemia, obstructive sleep apnea on CPAP, obesity.  He was previously followed by Dr. Verl Bishop and established with Dr. Curt Bishop in 2017.  He was last seen in 09/2017.    Jacob Bishop returns for surgical clearance. He Bishop knee replacement surgery.  He is here alone.  He notes shortness of breath over the past 9 to 12 months.  This seems to occur at any time.  He is significantly limited by his knee arthritis.  It is quite difficult for him to ambulate.  He had an episode of chest discomfort a few weeks ago.  He describes it as indigestion.  It was not like his previous angina.  He also has had episodes of episodic diaphoresis.  It does not seem to be related to any type of activity.  He does get short of breath with this.  He denies syncope or near syncope.  He sleeps with a CPAP.  He denies significant lower extremity swelling.  Prior CV studies:   The following studies were reviewed today:  Echo 01/11/18 EF 60-65, no RWMA, Gr 1 DD, trivial AI, PASP 41  Cardiac Catheterization 06/14/2003  LM ok LAD 80 after D1 LCx ok RCA mid 50, dist stent 40-50 ISR EF 60 PCI:  3.5 x 18 mm Cypher DES to the proximal LAD  Past Medical History:  Diagnosis Date  . Arthritis   . CAD in native artery   . Cellulitis   . Dermatitis   . Diabetes mellitus    type II  . Edema   . History of kidney stones   . Hx of colonic polyps   . Hyperlipidemia   . Hypertension   . Obesity   . OSA (obstructive sleep apnea)      cpap  . Positional vertigo   . Rhinosinusitis   . Skin lesion    Surgical Hx: The patient  has a past surgical history that includes Cardiac catheterization; Coronary stent placement; Colonoscopy; and Polypectomy.   Current Medications: Current Meds  Medication Sig  . albuterol (PROVENTIL HFA;VENTOLIN HFA) 108 (90 Base) MCG/ACT inhaler Inhale 2 puffs into the lungs every 6 (six) hours as needed for wheezing or shortness of breath.  Marland Kitchen amLODipine (NORVASC) 2.5 MG tablet TAKE 1 TABLET BY MOUTH EVERY DAY  . aspirin 81 MG tablet Take 81 mg by mouth daily.    . BD PEN NEEDLE NANO U/F 32G X 4 MM MISC USE AS DIRECTED TWICE A DAY  . fluticasone (FLONASE) 50 MCG/ACT nasal spray PLACE 2 SPRAYS INTO BOTH NOSTRILS DAILY AS NEEDED.  Marland Kitchen glipiZIDE (GLUCOTROL XL) 10 MG 24 hr tablet Take 1 tablet (10 mg total) by mouth daily with breakfast.  . Insulin Glargine (BASAGLAR KWIKPEN) 100 UNIT/ML SOPN Inject 0.4 mLs (40 Units total) into the skin every morning. And pen needles 1/day  . meclizine (ANTIVERT) 12.5 MG tablet Take 1 tablet (12.5 mg total) by mouth 3 (three) times daily  as needed for dizziness.  Jacob Bishop ointment OINTMENT TOPICAL APPLY TO WOUND WITH DRESSING CHANGES  . simvastatin (ZOCOR) 20 MG tablet TAKE 1 TABLET BY MOUTH EVERY DAY AT BEDTIME  . tamsulosin (FLOMAX) 0.4 MG CAPS capsule Take 0.4 mg by mouth daily.  . TOVIAZ 8 MG TB24 tablet Take 1 tablet by mouth daily.  Marland Kitchen VICTOZA 18 MG/3ML SOPN INJECT 1.2MG  UNDER THE SKIN EVERY DAY AS DIRECTED     Allergies:   Lipitor [atorvastatin calcium]   Social History   Tobacco Use  . Smoking status: Former Smoker    Last attempt to quit: 01/10/1996    Years since quitting: 22.3  . Smokeless tobacco: Never Used  Substance Use Topics  . Alcohol use: No  . Drug use: No     Family Hx: The patient's family history includes Cancer in his mother; Diabetes in his sister; Heart attack in his brother, father, and sister; Heart disease in his father and  sister.  ROS:   Please see the history of present illness.    Review of Systems  Constitution: Positive for malaise/fatigue.  Hematologic/Lymphatic: Bruises/bleeds easily.  Neurological: Positive for loss of balance.   All other systems reviewed and are negative.   EKGs/Labs/Other Test Reviewed:    EKG:  EKG is ordered today.  The ekg ordered today demonstrates sinus rhythm, heart rate 81, frequent PACs, PVCs, QTC 427  Recent Labs: 03/25/2018: ALT 46; BUN 16; Creatinine, Ser 0.81; Hemoglobin 14.5; Platelets 96.0; Potassium 4.1; Sodium 142   Recent Lipid Panel Lab Results  Component Value Date/Time   CHOL 144 03/25/2018 08:29 AM   TRIG 161.0 (H) 03/25/2018 08:29 AM   HDL 36.80 (L) 03/25/2018 08:29 AM   CHOLHDL 4 03/25/2018 08:29 AM   LDLCALC 75 03/25/2018 08:29 AM   LDLDIRECT 86.0 09/05/2015 05:21 PM    Physical Exam:    VS:  BP (!) 144/66   Pulse 77   Ht 5\' 11"  (1.803 m)   Wt 292 lb (132.5 kg)   SpO2 94%   BMI 40.73 kg/m     Wt Readings from Last 3 Encounters:  05/24/18 292 lb (132.5 kg)  04/06/18 287 lb (130.2 kg)  03/25/18 291 lb (132 kg)     Physical Exam  Constitutional: He is oriented to person, place, and time. He appears well-developed and well-nourished. No distress.  HENT:  Head: Normocephalic and atraumatic.  Eyes: No scleral icterus.  Neck: No JVD present. No thyromegaly present.  Cardiovascular: Normal rate and regular rhythm.  No murmur heard. Pulmonary/Chest: Effort normal. He has no rales.  Abdominal: Soft.  Musculoskeletal: He exhibits edema (trace-1+ bilat edema with chronic changes; +varicosities).  Lymphadenopathy:    He has no cervical adenopathy.  Neurological: He is alert and oriented to person, place, and time.  Skin: Skin is warm and dry.  Psychiatric: He has a normal mood and affect.    ASSESSMENT & PLAN:    Other chest pain The patient has had episodes of shortness of breath as well as episodes of chest pain over the past  several months.  He has not had an evaluation for ischemia in several years.  His last PCI was in 2004.  He is a diabetic with uncontrolled blood sugars.  He has frequent PACs on his ECG.  However, there are no acute ST-T wave changes.  As he does need clearance for knee surgery, I have recommended that we proceed with a Lexiscan Myoview to rule out significant ischemia.  -  Arrange pharmacologic nuclear stress test  Coronary artery disease involving native coronary artery of native heart without angina pectoris History of stenting to the RCA in 1997 and Cypher DES to the LAD in 2004.  As noted, he has had some chest discomfort recently.  This is somewhat atypical for his previous angina.  However, as he does need knee surgery and it has been several years since his last assessment for ischemia, I will have him undergo a Lexiscan Myoview.  Continue aspirin, statin.  Shortness of breath His shortness of breath is likely multifactorial.  I suspect it is related mainly to deconditioning from his arthritis as well as obesity.  He does have mild diastolic dysfunction on echocardiogram in May 2019.  As he does have frequent PACs and a PVC on his ECG today, I will obtain BMET, magnesium.  I will also obtain a BNP.  If his BNP is significant elevated, I will place him on low-dose furosemide.  -Labs: BMET, Mg2+, BNP  Preoperative cardiovascular examination The Revised Cardiac Risk Index indicates that his Perioperative Risk of Major Cardiac Event is (%): 6.6.  Therefore, he is at high risk for perioperative complications.  His Functional Capacity in METs is: 3.3 as indicated by the Duke Activity Status Index (DASI).  According to ACC/AHA guidelines, the patient will require a nuclear perfusion imaging test (stress test) before a decision can be made regarding surgery.  The patient will be scheduled for a nuclear stress test.  Our office will be in touch once further testing is complete to make a final disposition  regarding his surgery.     Essential hypertension BP somewhat elevated today.  It has been optimal recently.  Continue to monitor for now.    Hyperlipidemia, unspecified hyperlipidemia type He is intolerant to atorvastatin secondary to myalgias.  He is currently on simvastatin 20 mg daily.  Recent LDL was 75.  Ideally, his LDL should be less than 70.  Patient admits to dietary indiscretion.  If he continues to have an LDL greater than 70 despite dietary modifications, consider changing simvastatin to rosuvastatin.   Dispo:  Return in about 6 months (around 11/22/2018) for Routine Follow Up w/ Dr. Curt Bishop.   Medication Adjustments/Labs and Tests Ordered: Current medicines are reviewed at length with the patient today.  Concerns regarding medicines are outlined above.  Tests Ordered: Orders Placed This Encounter  Procedures  . Basic metabolic panel  . Magnesium  . Pro b natriuretic peptide (BNP)  . MYOCARDIAL PERFUSION IMAGING  . EKG 12-Lead   Medication Changes: No orders of the defined types were placed in this encounter.   Signed, Richardson Dopp, PA-C  05/24/2018 4:06 PM    [facility] [address], [place], [place]  [address] Phone: [phone]; Fax: [phone]

## 2018-05-25 LAB — BASIC METABOLIC PANEL
BUN/Creatinine Ratio: 22 (ref 10–24)
BUN: 20 mg/dL (ref 8–27)
CALCIUM: 9.4 mg/dL (ref 8.6–10.2)
CHLORIDE: 105 mmol/L (ref 96–106)
CO2: 24 mmol/L (ref 20–29)
CREATININE: 0.91 mg/dL (ref 0.76–1.27)
GFR calc Af Amer: 96 mL/min/{1.73_m2} (ref >59)
GFR calc non Af Amer: 83 mL/min/{1.73_m2} (ref >59)
GLUCOSE: 85 mg/dL (ref 65–99)
Potassium: 4.5 mmol/L (ref 3.5–5.2)
Sodium: 145 mmol/L — ABNORMAL HIGH (ref 134–144)

## 2018-05-25 LAB — PRO B NATRIURETIC PEPTIDE: NT-PRO BNP: 74 pg/mL (ref 0–376)

## 2018-05-25 LAB — MAGNESIUM: MAGNESIUM: 2 mg/dL (ref 1.6–2.3)

## 2018-06-02 ENCOUNTER — Telehealth (HOSPITAL_COMMUNITY): Payer: Self-pay | Admitting: *Deleted

## 2018-06-02 NOTE — Telephone Encounter (Signed)
Patient given detailed instructions per Myocardial Perfusion Study Information Sheet for the test on 06/07/18. Patient notified to arrive 15 minutes early and that it is imperative to arrive on time for appointment to keep from having the test rescheduled.  If you need to cancel or reschedule your appointment, please call the office within 24 hours of your appointment. . Patient verbalized understanding.Jacob Bishop [name]    

## 2018-06-07 ENCOUNTER — Ambulatory Visit (HOSPITAL_COMMUNITY): Payer: Medicare Other | Attending: Physician Assistant

## 2018-06-07 DIAGNOSIS — I2510 Atherosclerotic heart disease of native coronary artery without angina pectoris: Secondary | ICD-10-CM

## 2018-06-07 DIAGNOSIS — R0789 Other chest pain: Secondary | ICD-10-CM | POA: Diagnosis not present

## 2018-06-07 MED ORDER — TECHNETIUM TC 99M TETROFOSMIN IV KIT
32.3000 | PACK | Freq: Once | INTRAVENOUS | Status: AC | PRN
  Administered 2018-06-07: 32.3 via INTRAVENOUS
  Filled 2018-06-07: qty 33

## 2018-06-07 MED ORDER — REGADENOSON 0.4 MG/5ML IV SOLN
0.4000 mg | Freq: Once | INTRAVENOUS | Status: AC
  Administered 2018-06-07: 0.4 mg via INTRAVENOUS

## 2018-06-08 ENCOUNTER — Ambulatory Visit (HOSPITAL_COMMUNITY): Payer: Medicare Other | Attending: Physician Assistant

## 2018-06-08 ENCOUNTER — Telehealth: Payer: Self-pay | Admitting: *Deleted

## 2018-06-08 ENCOUNTER — Encounter: Payer: Self-pay | Admitting: Physician Assistant

## 2018-06-08 LAB — MYOCARDIAL PERFUSION IMAGING
CHL CUP NUCLEAR SSS: 0
CHL CUP RESTING HR STRESS: 85 {beats}/min
LV sys vol: 45 mL
LVDIAVOL: 108 mL (ref 62–150)
Peak HR: 101 {beats}/min
SDS: 0
SRS: 0
TID: 1.05

## 2018-06-08 NOTE — Telephone Encounter (Signed)
-----   Message from Liliane Shi, [place] sent at 06/08/2018  3:32 PM EDT ----- The stress test shows normal blood flow and normal heart function. Recommendations:  - He may proceed with his planned surgery at acceptable risk.  - Continue current medications.  - Send copy of stress test to PCP.  - Also send copy of my recent office note and this stress test result to the surgeon.  Richardson Dopp, PA-C    06/08/2018 3:30 PM

## 2018-06-08 NOTE — Telephone Encounter (Signed)
Pt has been notified of Myoview results and that he is ok to proceed with surgery. I will fax a copy of results to PCP [name], NP as well as surgeon Dr. Wynelle Link. Pt thanked me for the call.

## 2018-06-21 ENCOUNTER — Telehealth: Payer: Self-pay

## 2018-06-21 NOTE — Telephone Encounter (Signed)
Copied from [place] [id number]. Topic: General - Other >> Jun 21, 2018  3:26 PM Carolyn Stare wrote:  Roanna Epley with [facility] call to say she fax over a medical necessity on 06/02/18 and would like to know if it was received and filled out     [phone]   ext [phone]

## 2018-06-22 NOTE — Telephone Encounter (Signed)
Called and spoke to Jacob Bishop.  Informed her that I have not received form.  She will refax.  Will wait on form to arrive.

## 2018-06-24 NOTE — Telephone Encounter (Signed)
Received statement of medical necessity form for cpap supplies.  Called and left [place] a message.  Instructed her to have that faxed to the pt's pulmonologist.  No further action required at this time.

## 2018-07-05 ENCOUNTER — Other Ambulatory Visit: Payer: Self-pay | Admitting: Adult Health

## 2018-07-06 ENCOUNTER — Other Ambulatory Visit: Payer: Self-pay | Admitting: Adult Health

## 2018-07-06 NOTE — Telephone Encounter (Signed)
Sent to the pharmacy by e-scribe. 

## 2018-07-06 NOTE — Telephone Encounter (Signed)
Sent to the pharmacy by e-scribe.  I have scheduled him for A1C follow up on 07/13/18.

## 2018-07-13 ENCOUNTER — Ambulatory Visit (INDEPENDENT_AMBULATORY_CARE_PROVIDER_SITE_OTHER): Payer: Medicare Other | Admitting: Adult Health

## 2018-07-13 ENCOUNTER — Encounter: Payer: Self-pay | Admitting: Adult Health

## 2018-07-13 VITALS — BP 112/64 | Temp 97.8°F | Wt 283.0 lb

## 2018-07-13 DIAGNOSIS — E1165 Type 2 diabetes mellitus with hyperglycemia: Secondary | ICD-10-CM

## 2018-07-13 DIAGNOSIS — I2510 Atherosclerotic heart disease of native coronary artery without angina pectoris: Secondary | ICD-10-CM

## 2018-07-13 DIAGNOSIS — Z23 Encounter for immunization: Secondary | ICD-10-CM

## 2018-07-13 LAB — POCT GLYCOSYLATED HEMOGLOBIN (HGB A1C): HbA1c, POC (controlled diabetic range): 6 % (ref 0.0–7.0)

## 2018-07-13 NOTE — Progress Notes (Signed)
Subjective:    Patient ID: Jacob Bishop, male    DOB: 24-Feb-1941, 77 y.o.   MRN: [id number]  HPI 77 year old male who  has a past medical history of Arthritis, CAD in native artery, Cellulitis, Dermatitis, Diabetes mellitus, Edema, History of kidney stones, colonic polyps, Hyperlipidemia, Hypertension, Obesity, OSA (obstructive sleep apnea), Positional vertigo, Rhinosinusitis, and Skin lesion.  He presents to the office today for follow up of A1c. His last A1c was 7.8. At this time Glipzide XR was increased to 10 mg. He is also currently prescribed Victoza 1.2 mg and Basaglar 40 units every morning.   His right knee surgery was postponed until February as his surgeon wanted him to work on weight loss of 15 pounds and to bring his A1c down to 7.3 prior to surgery.   He reports that he has cut out eating sugars are carbs. He has been actively losing weight. He has been checking his blood sugars at home and has been consistently having readings in the 130-140. He had a few lows in the 40's and 50's and has titrated his insulin to 30 units.  He is feeling better overall.     Wt Readings from Last 3 Encounters:  07/13/18 283 lb (128.4 kg)  06/07/18 292 lb (132.5 kg)  05/24/18 292 lb (132.5 kg)    Review of Systems See HPI   Past Medical History:  Diagnosis Date  . Arthritis   . CAD in native artery    Nuclear stress test 10/19: EF 58, normal perfusion, low risk  . Cellulitis   . Dermatitis   . Diabetes mellitus    type II  . Edema   . History of kidney stones   . Hx of colonic polyps   . Hyperlipidemia   . Hypertension   . Obesity   . OSA (obstructive sleep apnea)    cpap  . Positional vertigo   . Rhinosinusitis   . Skin lesion     Social History   Socioeconomic History  . Marital status: Married    Spouse name: Not on file  . Number of children: Not on file  . Years of education: Not on file  . Highest education level: Not on file  Occupational History  .  Occupation: Financial controller, Health and safety inspector: [employer]  Social Needs  . Financial resource strain: Not on file  . Food insecurity:    Worry: Not on file    Inability: Not on file  . Transportation needs:    Medical: Not on file    Non-medical: Not on file  Tobacco Use  . Smoking status: Former Smoker    Last attempt to quit: 01/10/1996    Years since quitting: 22.5  . Smokeless tobacco: Never Used  Substance and Sexual Activity  . Alcohol use: No  . Drug use: No  . Sexual activity: Not on file  Lifestyle  . Physical activity:    Days per week: Not on file    Minutes per session: Not on file  . Stress: Not on file  Relationships  . Social connections:    Talks on phone: Not on file    Gets together: Not on file    Attends religious service: Not on file    Active member of club or organization: Not on file    Attends meetings of clubs or organizations: Not on file    Relationship status: Not on file  . Intimate partner violence:  Fear of current or ex partner: Not on file    Emotionally abused: Not on file    Physically abused: Not on file    Forced sexual activity: Not on file  Other Topics Concern  . Not on file  Social History Narrative   Grew up in [place]. Mother was [place], Father [place].   He works daily - owns a [employer]    Married for 30+ years   Has a daughter who lives in [place]     Past Surgical History:  Procedure Laterality Date  . CARDIAC CATHETERIZATION    . COLONOSCOPY    . CORONARY STENT PLACEMENT     3 stents /1997  . POLYPECTOMY      Family History  Problem Relation Age of Onset  . Cancer Mother        ? lung cancer  . Heart disease Father   . Heart attack Father   . Heart disease Sister   . Diabetes Sister   . Heart attack Sister   . Heart attack Brother     Allergies  Allergen Reactions  . Lipitor [Atorvastatin Calcium] Other (See Comments)    Muscle soreness    Current  Outpatient Medications on File Prior to Visit  Medication Sig Dispense Refill  . albuterol (PROVENTIL HFA;VENTOLIN HFA) 108 (90 Base) MCG/ACT inhaler Inhale 2 puffs into the lungs every 6 (six) hours as needed for wheezing or shortness of breath. 1 Inhaler 0  . amLODipine (NORVASC) 2.5 MG tablet TAKE 1 TABLET BY MOUTH EVERY DAY 90 tablet 1  . aspirin 81 MG tablet Take 81 mg by mouth daily.      . BD PEN NEEDLE NANO U/F 32G X 4 MM MISC USE AS DIRECTED TWICE A DAY 200 each 0  . fluticasone (FLONASE) 50 MCG/ACT nasal spray PLACE 2 SPRAYS INTO BOTH NOSTRILS DAILY AS NEEDED. 48 g 2  . glipiZIDE (GLUCOTROL XL) 10 MG 24 hr tablet TAKE 1 TABLET (10 MG TOTAL) BY MOUTH DAILY WITH BREAKFAST. 90 tablet 0  . Insulin Glargine (BASAGLAR KWIKPEN) 100 UNIT/ML SOPN Inject 0.4 mLs (40 Units total) into the skin every morning. And pen needles 1/day 5 pen 0  . meclizine (ANTIVERT) 12.5 MG tablet Take 1 tablet (12.5 mg total) by mouth 3 (three) times daily as needed for dizziness. 30 tablet 1  . SANTYL ointment OINTMENT TOPICAL APPLY TO WOUND WITH DRESSING CHANGES  0  . simvastatin (ZOCOR) 20 MG tablet TAKE 1 TABLET BY MOUTH EVERY DAY AT BEDTIME 90 tablet 3  . tamsulosin (FLOMAX) 0.4 MG CAPS capsule Take 0.4 mg by mouth daily.  11  . TOVIAZ 8 MG TB24 tablet Take 1 tablet by mouth daily.    Marland Kitchen VICTOZA 18 MG/3ML SOPN INJECT 1.2MG  UNDER THE SKIN EVERY DAY AS DIRECTED 15 mL 1   No current facility-administered medications on file prior to visit.     BP 112/64   Temp 97.8 F (36.6 C)   Wt 283 lb (128.4 kg)   BMI 39.47 kg/m       Objective:   Physical Exam  Constitutional: He appears well-developed and well-nourished. No distress.  Cardiovascular: Normal rate, regular rhythm, normal heart sounds and intact distal pulses.  Pulmonary/Chest: Effort normal and breath sounds normal.  Musculoskeletal: He exhibits tenderness.  Neurological: He is alert.  Skin: Skin is warm and dry. He is not diaphoretic.    Psychiatric: He has a normal mood and affect. His behavior is  normal. Thought content normal.  Nursing note and vitals reviewed.     Assessment & Plan:  1. Uncontrolled type 2 diabetes mellitus with hyperglycemia (HCC) - POCT A1C 6.0 - has improved.  - He has lost 9 pounds thus far - Congratulated and encouraged to continue work on weight loss.  - Handicap place card provided  - Follow up in 3 months or sooner if needed  [name], NP

## 2018-07-14 ENCOUNTER — Telehealth: Payer: Self-pay | Admitting: Adult Health

## 2018-07-14 NOTE — Telephone Encounter (Signed)
Received message from Mr. Wiedeman that he wanted to schedule his Medicare Wellness visit. Spoke with Mr. Dudding he stated that he would give office a call when he is ready to schedule his appointment. SF

## 2018-07-20 ENCOUNTER — Encounter (HOSPITAL_COMMUNITY): Payer: Medicare Other

## 2018-09-10 ENCOUNTER — Other Ambulatory Visit: Payer: Self-pay | Admitting: Adult Health

## 2018-09-10 NOTE — Telephone Encounter (Signed)
Requested medication (s) are due for refill today: yes  Requested medication (s) are on the active medication list: yes    Last refill: 08/11/17  Future visit scheduled no  Notes to clinic:historical provider  Requested Prescriptions  Pending Prescriptions Disp Refills   Insulin Glargine (BASAGLAR KWIKPEN) 100 UNIT/ML SOPN 5 pen 0    Sig: Inject 0.4 mLs (40 Units total) into the skin every morning. And pen needles 1/day     Endocrinology:  Diabetes - Insulins Passed - 09/10/2018  5:26 PM      Passed - HBA1C is between 0 and 7.9 and within 180 days    HbA1c, POC (controlled diabetic range)  Date Value Ref Range Status  07/13/2018 6.0 0.0 - 7.0 % Final         Passed - Valid encounter within last 6 months    Recent Outpatient Visits          1 month ago Uncontrolled type 2 diabetes mellitus with hyperglycemia ([place])   Therapist, [facility] at [place], NP   5 months ago Pre-operative clearance   Therapist, [facility] at [place], NP   8 months ago Uncontrolled type 2 diabetes mellitus with hyperglycemia ([place])   Therapist, [facility] at [place], NP   11 months ago Uncontrolled type 2 diabetes mellitus with hyperglycemia ([place])   Therapist, [facility] at [place], NP   1 year ago Uncontrolled type 2 diabetes mellitus with diabetic dermatitis, with long-term current use of insulin ([place])   Therapist, [facility] at [facility], [place], NP      Future Appointments            In 6 months Fenton Foy, NP [facility]

## 2018-09-10 NOTE — Telephone Encounter (Signed)
Copied from [place] [id number]. Topic: Quick Communication - Rx Refill/Question >> Sep 10, 2018  5:08 PM Yvette Rack wrote: Medication: Insulin Glargine (BASAGLAR KWIKPEN) 100 UNIT/ML SOPN  Has the patient contacted their pharmacy? yes   Preferred Pharmacy (with phone number or street name): [id number] [place], [address] [phone] (Phone)  [phone] (Fax)  Agent: Please be advised that RX refills may take up to 3 business days. We ask that you follow-up with your pharmacy.

## 2018-09-16 MED ORDER — BASAGLAR KWIKPEN 100 UNIT/ML ~~LOC~~ SOPN: 40.0000 [IU] | PEN_INJECTOR | SUBCUTANEOUS | 0 refills | Status: DC

## 2018-09-16 NOTE — Telephone Encounter (Signed)
Ok to refill for 90 days  

## 2018-09-24 ENCOUNTER — Encounter (HOSPITAL_COMMUNITY): Payer: Self-pay

## 2018-09-24 NOTE — Pre-Procedure Instructions (Signed)
The following are in epic: Cardiac clearance 05/24/2018 S. Weaver P.A. EKG 05/24/2018 Last office visit Nafziger P.A. 07/13/2018 Stress test 06/07/2018 ECHO 01/11/2018 CXR 03/25/2018

## 2018-09-24 NOTE — Patient Instructions (Addendum)
Your procedure is scheduled on: Monday, Feb. 10, 2020   Surgery Time:  12:40PM-1:30PM   Report to [facility]  Entrance    Report to admitting at 10:10 AM   Call this number if you have problems the morning of surgery [phone]   Do not eat food or drink liquids :After Midnight.   Brush your teeth the morning of surgery.   Do NOT smoke after Midnight   Take these medicines the morning of surgery with A SIP OF WATER: Tamsulosin. Toviaz   Bring Asthma Inhaler day of surgery  DO NOT TAKE ANY DIABETIC MEDICATIONS DAY OF YOUR SURGERY                               You may not have any metal on your body including  jewelry, and body piercings             Do not wear lotions, powders, perfumes/cologne, or deodorant                          Men may shave face and neck.   Do not bring valuables to the hospital. [place].   Contacts, dentures or bridgework may not be worn into surgery.   Leave suitcase in the car. After surgery it may be brought to your room.    Special Instructions: Bring a copy of your healthcare power of attorney and living will documents the day of surgery if you haven't scanned them in before.               Please read over the following fact sheets you were given:   How to Manage Your Diabetes Before and After Surgery  Why is it important to control my blood sugar before and after surgery? . Improving blood sugar levels before and after surgery helps healing and can limit problems. . A way of improving blood sugar control is eating a healthy diet by: o  Eating less sugar and carbohydrates o  Increasing activity/exercise o  Talking with your doctor about reaching your blood sugar goals . High blood sugars (greater than 180 mg/dL) can raise your risk of infections and slow your recovery, so you will need to focus on controlling your diabetes during the weeks before surgery. . Make sure  that the doctor who takes care of your diabetes knows about your planned surgery including the date and location.  How do I manage my blood sugar before surgery? . Check your blood sugar at least 4 times a day, starting 2 days before surgery, to make sure that the level is not too high or low. o Check your blood sugar the morning of your surgery when you wake up and every 2 hours until you get to the Short Stay unit. . If your blood sugar is less than 70 mg/dL, you will need to treat for low blood sugar: o Do not take insulin. o Treat a low blood sugar (less than 70 mg/dL) with  cup of clear juice (cranberry or apple), 4 glucose tablets, OR glucose gel. o Recheck blood sugar in 15 minutes after treatment (to make sure it is greater than 70 mg/dL). If your blood sugar is not greater than 70 mg/dL on recheck, call [phone] for further instructions. Marland Kitchen  Report your blood sugar to the short stay nurse when you get to Short Stay.  . If you are admitted to the hospital after surgery: o Your blood sugar will be checked by the staff and you will probably be given insulin after surgery (instead of oral diabetes medicines) to make sure you have good blood sugar levels. o The goal for blood sugar control after surgery is 80-180 mg/dL.   WHAT DO I DO ABOUT MY DIABETES MEDICATION?  Marland Kitchen Do not take oral diabetes medicines (pills) the morning of surgery.  THE DAY BEFORE SURGERY, take your usual morning dose of Glipizide. Take 40 units of Glargine insulin in the morning.   . The day of surgery, do not take other diabetes injectables, including Byetta (exenatide), Bydureon (exenatide ER), Victoza (liraglutide), or Trulicity (dulaglutide).       Reviewed and Endorsed by [facility] Patient Education Committee, August 2015      [facility] - Preparing for Surgery Before surgery, you can play an important role.  Because skin is not sterile, your skin needs to be as free of germs as possible.  You can  reduce the number of germs on your skin by washing with CHG (chlorahexidine gluconate) soap before surgery.  CHG is an antiseptic cleaner which kills germs and bonds with the skin to continue killing germs even after washing. Please DO NOT use if you have an allergy to CHG or antibacterial soaps.  If your skin becomes reddened/irritated stop using the CHG and inform your nurse when you arrive at Short Stay. Do not shave (including legs and underarms) for at least 48 hours prior to the first CHG shower.  You may shave your face/neck.  Please follow these instructions carefully:  1.  Shower with CHG Soap the night before surgery and the  morning of surgery.  2.  If you choose to wash your hair, wash your hair first as usual with your normal  shampoo.  3.  After you shampoo, rinse your hair and body thoroughly to remove the shampoo.                             4.  Use CHG as you would any other liquid soap.  You can apply chg directly to the skin and wash.  Gently with a scrungie or clean washcloth.  5.  Apply the CHG Soap to your body ONLY FROM THE NECK DOWN.   Do   not use on face/ open                           Wound or open sores. Avoid contact with eyes, ears mouth and   genitals (private parts).                       Wash face,  Genitals (private parts) with your normal soap.             6.  Wash thoroughly, paying special attention to the area where your    surgery  will be performed.  7.  Thoroughly rinse your body with warm water from the neck down.  8.  DO NOT shower/wash with your normal soap after using and rinsing off the CHG Soap.                9.  Pat yourself dry with a clean towel.  10.  Wear clean pajamas.            11.  Place clean sheets on your bed the night of your first shower and do not  sleep with pets. Day of Surgery : Do not apply any lotions/deodorants the morning of surgery.  Please wear clean clothes to the hospital/surgery center.  FAILURE TO FOLLOW THESE  INSTRUCTIONS MAY RESULT IN THE CANCELLATION OF YOUR SURGERY  PATIENT SIGNATURE_________________________________  NURSE SIGNATURE__________________________________  ________________________________________________________________________   Jacob Bishop  An incentive spirometer is a tool that can help keep your lungs clear and active. This tool measures how well you are filling your lungs with each breath. Taking long deep breaths may help reverse or decrease the chance of developing breathing (pulmonary) problems (especially infection) following:  A long period of time when you are unable to move or be active. BEFORE THE PROCEDURE   If the spirometer includes an indicator to show your best effort, your nurse or respiratory therapist will set it to a desired goal.  If possible, sit up straight or lean slightly forward. Try not to slouch.  Hold the incentive spirometer in an upright position. INSTRUCTIONS FOR USE  1. Sit on the edge of your bed if possible, or sit up as far as you can in bed or on a chair. 2. Hold the incentive spirometer in an upright position. 3. Breathe out normally. 4. Place the mouthpiece in your mouth and seal your lips tightly around it. 5. Breathe in slowly and as deeply as possible, raising the piston or the ball toward the top of the column. 6. Hold your breath for 3-5 seconds or for as long as possible. Allow the piston or ball to fall to the bottom of the column. 7. Remove the mouthpiece from your mouth and breathe out normally. 8. Rest for a few seconds and repeat Steps 1 through 7 at least 10 times every 1-2 hours when you are awake. Take your time and take a few normal breaths between deep breaths. 9. The spirometer may include an indicator to show your best effort. Use the indicator as a goal to work toward during each repetition. 10. After each set of 10 deep breaths, practice coughing to be sure your lungs are clear. If you have an incision (the  cut made at the time of surgery), support your incision when coughing by placing a pillow or rolled up towels firmly against it. Once you are able to get out of bed, walk around indoors and cough well. You may stop using the incentive spirometer when instructed by your caregiver.  RISKS AND COMPLICATIONS  Take your time so you do not get dizzy or light-headed.  If you are in pain, you may need to take or ask for pain medication before doing incentive spirometry. It is harder to take a deep breath if you are having pain. AFTER USE  Rest and breathe slowly and easily.  It can be helpful to keep track of a log of your progress. Your caregiver can provide you with a simple table to help with this. If you are using the spirometer at home, follow these instructions: [place] IF:   You are having difficultly using the spirometer.  You have trouble using the spirometer as often as instructed.  Your pain medication is not giving enough relief while using the spirometer.  You develop fever of 100.5 F (38.1 C) or higher. SEEK IMMEDIATE MEDICAL CARE IF:   You cough up  bloody sputum that had not been present before.  You develop fever of 102 F (38.9 C) or greater.  You develop worsening pain at or near the incision site. MAKE SURE YOU:   Understand these instructions.  Will watch your condition.  Will get help right away if you are not doing well or get worse. Document Released: 12/22/2006 Document Revised: 11/03/2011 Document Reviewed: 02/22/2007 ExitCare Patient Information 2014 ExitCare, [place].   ________________________________________________________________________  WHAT IS A BLOOD TRANSFUSION? Blood Transfusion Information  A transfusion is the replacement of blood or some of its parts. Blood is made up of multiple cells which provide different functions.  Red blood cells carry oxygen and are used for blood loss replacement.  White blood cells fight against  infection.  Platelets control bleeding.  Plasma helps clot blood.  Other blood products are available for specialized needs, such as hemophilia or other clotting disorders. BEFORE THE TRANSFUSION  Who gives blood for transfusions?   Healthy volunteers who are fully evaluated to make sure their blood is safe. This is blood bank blood. Transfusion therapy is the safest it has ever been in the practice of medicine. Before blood is taken from a donor, a complete history is taken to make sure that person has no history of diseases nor engages in risky social behavior (examples are intravenous drug use or sexual activity with multiple partners). The donor's travel history is screened to minimize risk of transmitting infections, such as malaria. The donated blood is tested for signs of infectious diseases, such as HIV and hepatitis. The blood is then tested to be sure it is compatible with you in order to minimize the chance of a transfusion reaction. If you or a relative donates blood, this is often done in anticipation of surgery and is not appropriate for emergency situations. It takes many days to process the donated blood. RISKS AND COMPLICATIONS Although transfusion therapy is very safe and saves many lives, the main dangers of transfusion include:   Getting an infectious disease.  Developing a transfusion reaction. This is an allergic reaction to something in the blood you were given. Every precaution is taken to prevent this. The decision to have a blood transfusion has been considered carefully by your caregiver before blood is given. Blood is not given unless the benefits outweigh the risks. AFTER THE TRANSFUSION  Right after receiving a blood transfusion, you will usually feel much better and more energetic. This is especially true if your red blood cells have gotten low (anemic). The transfusion raises the level of the red blood cells which carry oxygen, and this usually causes an energy  increase.  The nurse administering the transfusion will monitor you carefully for complications. HOME CARE INSTRUCTIONS  No special instructions are needed after a transfusion. You may find your energy is better. Speak with your caregiver about any limitations on activity for underlying diseases you may have. SEEK MEDICAL CARE IF:   Your condition is not improving after your transfusion.  You develop redness or irritation at the intravenous (IV) site. SEEK IMMEDIATE MEDICAL CARE IF:  Any of the following symptoms occur over the next 12 hours:  Shaking chills.  You have a temperature by mouth above 102 F (38.9 C), not controlled by medicine.  Chest, back, or muscle pain.  People around you feel you are not acting correctly or are confused.  Shortness of breath or difficulty breathing.  Dizziness and fainting.  You get a rash or develop hives.  You have  a decrease in urine output.  Your urine turns a dark color or changes to pink, red, or brown. Any of the following symptoms occur over the next 10 days:  You have a temperature by mouth above 102 F (38.9 C), not controlled by medicine.  Shortness of breath.  Weakness after normal activity.  The white part of the eye turns yellow (jaundice).  You have a decrease in the amount of urine or are urinating less often.  Your urine turns a dark color or changes to pink, red, or brown. Document Released: 08/08/2000 Document Revised: 11/03/2011 Document Reviewed: 03/27/2008 [employer] Patient Information 2014 [place], [place].  _______________________________________________________________________

## 2018-09-27 ENCOUNTER — Encounter (HOSPITAL_COMMUNITY): Payer: Self-pay

## 2018-09-27 ENCOUNTER — Encounter (HOSPITAL_COMMUNITY)
Admission: RE | Admit: 2018-09-27 | Discharge: 2018-09-27 | Disposition: A | Discharge status: Home / Self Care | Payer: Medicare Other | Source: Ambulatory Visit | Attending: Orthopedic Surgery | Admitting: Orthopedic Surgery

## 2018-09-27 ENCOUNTER — Other Ambulatory Visit: Payer: Self-pay

## 2018-09-27 DIAGNOSIS — M1711 Unilateral primary osteoarthritis, right knee: Secondary | ICD-10-CM | POA: Insufficient documentation

## 2018-09-27 DIAGNOSIS — Z01812 Encounter for preprocedural laboratory examination: Secondary | ICD-10-CM | POA: Diagnosis not present

## 2018-09-27 HISTORY — DX: Diverticulosis of intestine, part unspecified, without perforation or abscess without bleeding: K57.90

## 2018-09-27 HISTORY — DX: Dyspnea, unspecified: R06.00

## 2018-09-27 HISTORY — DX: Fatty (change of) liver, not elsewhere classified: K76.0

## 2018-09-27 HISTORY — DX: Other ill-defined heart diseases: I51.89

## 2018-09-27 HISTORY — DX: Other forms of dyspnea: R06.09

## 2018-09-27 HISTORY — DX: Rheumatic tricuspid insufficiency: I07.1

## 2018-09-27 HISTORY — DX: Other hemorrhoids: K64.8

## 2018-09-27 HISTORY — DX: Nonrheumatic aortic (valve) insufficiency: I35.1

## 2018-09-27 HISTORY — DX: Pulmonary hypertension, unspecified: I27.20

## 2018-09-27 LAB — COMPREHENSIVE METABOLIC PANEL
ALK PHOS: 60 U/L (ref 38–126)
ALT: 48 U/L — ABNORMAL HIGH (ref 0–44)
AST: 35 U/L (ref 15–41)
Albumin: 4.1 g/dL (ref 3.5–5.0)
Anion gap: 9 (ref 5–15)
BUN: 24 mg/dL — AB (ref 8–23)
CO2: 25 mmol/L (ref 22–32)
Calcium: 8.7 mg/dL — ABNORMAL LOW (ref 8.9–10.3)
Chloride: 108 mmol/L (ref 98–111)
Creatinine, Ser: 0.93 mg/dL (ref 0.61–1.24)
GFR calc Af Amer: >60 mL/min (ref >60)
GFR calc non Af Amer: >60 mL/min (ref >60)
Glucose, Bld: 119 mg/dL — ABNORMAL HIGH (ref 70–99)
Potassium: 4.4 mmol/L (ref 3.5–5.1)
SODIUM: 142 mmol/L (ref 135–145)
Total Bilirubin: 1.1 mg/dL (ref 0.3–1.2)
Total Protein: 7.2 g/dL (ref 6.5–8.1)

## 2018-09-27 LAB — CBC
HCT: 40.4 % (ref 39.0–52.0)
Hemoglobin: 13.7 g/dL (ref 13.0–17.0)
MCH: 32.1 pg (ref 26.0–34.0)
MCHC: 33.9 g/dL (ref 30.0–36.0)
MCV: 94.6 fL (ref 80.0–100.0)
NRBC: 0 % (ref 0.0–0.2)
Platelets: 84 10*3/uL — ABNORMAL LOW (ref 150–400)
RBC: 4.27 MIL/uL (ref 4.22–5.81)
RDW: 12.9 % (ref 11.5–15.5)
WBC: 4.6 10*3/uL (ref 4.0–10.5)

## 2018-09-27 LAB — PROTIME-INR
INR: 1.08
Prothrombin Time: 13.9 seconds (ref 11.4–15.2)

## 2018-09-27 LAB — HEMOGLOBIN A1C
Hgb A1c MFr Bld: 6.5 % — ABNORMAL HIGH (ref 4.8–5.6)
Mean Plasma Glucose: 139.85 mg/dL

## 2018-09-27 LAB — SURGICAL PCR SCREEN
MRSA, PCR: NEGATIVE
Staphylococcus aureus: NEGATIVE

## 2018-09-27 LAB — GLUCOSE, CAPILLARY: Glucose-Capillary: 109 mg/dL — ABNORMAL HIGH (ref 70–99)

## 2018-09-27 LAB — APTT: APTT: 29 s (ref 24–36)

## 2018-09-27 NOTE — H&P (Signed)
TOTAL KNEE ADMISSION H&P  Patient is being admitted for right total knee arthroplasty.  Subjective:  Chief Complaint:right knee pain.  HPI: Jacob Bishop, 77 y.o. male, has a history of pain and functional disability in the right knee due to arthritis and has failed non-surgical conservative treatments for greater than 12 weeks to includecorticosteriod injections, viscosupplementation injections and activity modification.  Onset of symptoms was gradual, starting 5 years ago with gradually worsening course since that time. The patient noted no past surgery on the right knee(s).  Patient currently rates pain in the right knee(s) at 8 out of 10 with activity. Patient has worsening of pain with activity and weight bearing, crepitus and instability.  Patient has evidence of bone-on-bone arthritis in the medial and patellofemoral compartments with varus deformity. by imaging studies. There is no active infection.  Patient Active Problem List   Diagnosis Date Noted  . Other secondary pulmonary hypertension ([place]) 04/06/2018  . Osteoarthritis, hand 05/05/2014  . Carpal tunnel syndrome 05/02/2014  . Eczema 01/23/2014  . Thrombocytopenia, unspecified ([place]) 11/19/2012  . Overactive bladder 08/24/2012  . Osteoarthritis of right knee 01/22/2012  . Benign positional vertigo 07/09/2011  . CAD, NATIVE VESSEL 10/22/2009  . EDEMA 08/03/2009  . Obstructive sleep apnea 05/03/2009  . SKIN LESION 09/23/2007  . Diabetes mellitus type 2, uncontrolled ([place]) 03/23/2007  . Hyperlipidemia 03/23/2007  . Essential hypertension 03/23/2007  . COLONIC POLYPS, HX OF 03/23/2007  . Morbid obesity ([place]) 03/23/2007   Past Medical History:  Diagnosis Date  . AI (aortic insufficiency) 01/11/2018   Trace, noted on ECHO  . Arthritis   . CAD in native artery    Nuclear stress test 10/19: EF 58, normal perfusion, low risk  . Cellulitis   . Dermatitis   . Diabetes mellitus    type II  . Diastolic dysfunction 53/61/4431   Mild, noted on ECHO  . Diverticulosis 01/31/2016   ASCENDING COLON AND CECUM, NOTED ON COLONOSCOPY  . DOE (dyspnea on exertion)   . Edema   . Fatty liver 09/18/2004   Noted on [place] ABD  . History of kidney stones 05/23/2002   Noted on CT Abd  . Hx of colonic polyps 01/31/2016  . Hyperlipidemia   . Hypertension   . Internal hemorrhoids 01/31/2016   Noted on colonoscopy  . Obesity   . OSA (obstructive sleep apnea)    cpap  . Positional vertigo   . Pulmonary hypertension ([place]) 01/11/2018   Mild, noted on ECHO  . Rhinosinusitis   . Skin lesion   . TR (tricuspid regurgitation) 01/11/2018   Trace, noted on ECHO    Past Surgical History:  Procedure Laterality Date  . CARDIAC CATHETERIZATION    . COLONOSCOPY    . CORONARY STENT PLACEMENT     3 stents /1997  . POLYPECTOMY      No current facility-administered medications for this encounter.    Current Outpatient Medications  Medication Sig Dispense Refill Last Dose  . albuterol (PROVENTIL HFA;VENTOLIN HFA) 108 (90 Base) MCG/ACT inhaler Inhale 2 puffs into the lungs every 6 (six) hours as needed for wheezing or shortness of breath. 1 Inhaler 0 Taking  . amLODipine (NORVASC) 2.5 MG tablet TAKE 1 TABLET BY MOUTH EVERY DAY (Patient taking differently: Take 2.5 mg by mouth daily. ) 90 tablet 1 Taking  . aspirin 81 MG tablet Take 81 mg by mouth daily.     Taking  . fluticasone (FLONASE) 50 MCG/ACT nasal spray PLACE 2 SPRAYS INTO BOTH  NOSTRILS DAILY AS NEEDED. (Patient taking differently: Place 2 sprays into both nostrils daily as needed for rhinitis. ) 48 g 2 Taking  . glipiZIDE (GLUCOTROL XL) 10 MG 24 hr tablet TAKE 1 TABLET (10 MG TOTAL) BY MOUTH DAILY WITH BREAKFAST. (Patient taking differently: Take 10 mg by mouth 2 (two) times a week. ) 90 tablet 0 Taking  . Insulin Glargine (BASAGLAR KWIKPEN) 100 UNIT/ML SOPN Inject 0.4 mLs (40 Units total) into the skin every morning. And pen needles 1/day 36 mL 0   . meclizine (ANTIVERT) 12.5 MG  tablet Take 1 tablet (12.5 mg total) by mouth 3 (three) times daily as needed for dizziness. 30 tablet 1 Taking  . simvastatin (ZOCOR) 20 MG tablet TAKE 1 TABLET BY MOUTH EVERY DAY AT BEDTIME (Patient taking differently: Take 20 mg by mouth at bedtime. ) 90 tablet 3 Taking  . tamsulosin (FLOMAX) 0.4 MG CAPS capsule Take 0.4 mg by mouth daily.  11 Taking  . TOVIAZ 8 MG TB24 tablet Take 8 mg by mouth daily.    Taking  . VICTOZA 18 MG/3ML SOPN INJECT 1.2MG  UNDER THE SKIN EVERY DAY AS DIRECTED (Patient taking differently: Inject 1.2 mg into the skin daily. ) 15 mL 1 Taking  . BD PEN NEEDLE NANO U/F 32G X 4 MM MISC USE AS DIRECTED TWICE A DAY (Patient not taking: Reported on 09/16/2018) 200 each 0 Not Taking at Unknown time   Allergies  Allergen Reactions  . Lipitor [Atorvastatin Calcium] Other (See Comments)    Muscle soreness    Social History   Tobacco Use  . Smoking status: Former Smoker    Last attempt to quit: 01/10/1996    Years since quitting: 22.7  . Smokeless tobacco: Never Used  Substance Use Topics  . Alcohol use: No    Family History  Problem Relation Age of Onset  . Cancer Mother        ? lung cancer  . Heart disease Father   . Heart attack Father   . Heart disease Sister   . Diabetes Sister   . Heart attack Sister   . Heart attack Brother      Review of Systems  Constitutional: Negative for chills and fever.  HENT: Negative for congestion, sore throat and tinnitus.   Eyes: Negative for double vision, photophobia and pain.  Respiratory: Negative for cough, shortness of breath and wheezing.   Cardiovascular: Negative for chest pain, palpitations and orthopnea.  Gastrointestinal: Negative for heartburn, nausea and vomiting.  Genitourinary: Negative for dysuria, frequency and urgency.  Musculoskeletal: Positive for joint pain.  Neurological: Negative for dizziness, weakness and headaches.    Objective:  Physical Exam  Well nourished and well developed.  General:  Alert and oriented x3, cooperative and pleasant, no acute distress.  Head: normocephalic, atraumatic, neck supple.  Eyes: EOMI.  Respiratory: breath sounds clear in all fields, no wheezing, rales, or rhonchi. Cardiovascular: Regular rate and rhythm, no murmurs, gallops or rubs.  Abdomen: non-tender to palpation and soft, normoactive bowel sounds. Musculoskeletal:  Right knee shows varus deformity. Range 10-120 with marked crepitus . He is tender medial greater than lateral with no instability. Gait pattern is significantly antalgic on the right  Calves soft and nontender. Motor function intact in LE. Strength 5/5 LE bilaterally. Neuro: Distal pulses 2+. Sensation to light touch intact in LE.  Vital signs in last 24 hours: Blood pressure: 128/68 mmHg  Labs:   Estimated body mass index is 39.47 kg/m as calculated  from the following:   Height as of 06/07/18: 5\' 11"  (1.803 m).   Weight as of 07/13/18: 128.4 kg.   Imaging Review Plain radiographs demonstrate severe degenerative joint disease of the right knee(s). The overall alignment ismild varus. The bone quality appears to be adequate for age and reported activity level.   Preoperative templating of the joint replacement has been completed, documented, and submitted to the Operating Room personnel in order to optimize intra-operative equipment management.   Anticipated LOS equal to or greater than 2 midnights due to - Age 51 and older with one or more of the following:  - Obesity  - Expected need for hospital services (PT, OT, Nursing) required for safe  discharge  - Anticipated need for postoperative skilled nursing care or inpatient rehab  - Active co-morbidities: Diabetes and Coronary Artery Disease OR   - Unanticipated findings during/Post Surgery: None  - Patient is a high risk of re-admission due to: None     Assessment/Plan:  End stage arthritis, right knee   The patient history, physical examination, clinical  judgment of the provider and imaging studies are consistent with end stage degenerative joint disease of the right knee(s) and total knee arthroplasty is deemed medically necessary. The treatment options including medical management, injection therapy arthroscopy and arthroplasty were discussed at length. The risks and benefits of total knee arthroplasty were presented and reviewed. The risks due to aseptic loosening, infection, stiffness, patella tracking problems, thromboembolic complications and other imponderables were discussed. The patient acknowledged the explanation, agreed to proceed with the plan and consent was signed. Patient is being admitted for inpatient treatment for surgery, pain control, PT, OT, prophylactic antibiotics, VTE prophylaxis, progressive ambulation and ADL's and discharge planning. The patient is planning to be discharged home.   Therapy Plans: outpatient therapy at [facility] Disposition: Home with wife Planned DVT Prophylaxis: Xarelto 10 mg daily (cardiac hx) DME needed: Gilford Rile PCP: [name], FNP Cardiologist: Rosaria Ferries, PA-C TXA: IV Allergies: Adhesive tapes (rash) Anesthesia Concerns: Sleep apnea BMI: 40.9 Last HgbA1c: 6.0% Other: Mr. Galindo BMI is 40.9 today at the office. He has been working towards losing weight for several months now and is within 7 pounds of his goal. He will continue working on this, he understands that we will have to cancel his surgery if he is still over 40 on 2/10.  - Patient was instructed on what medications to stop prior to surgery. - Follow-up visit in 2 weeks with Dr. Wynelle Link - Begin physical therapy following surgery - Pre-operative lab work as pre-surgical testing - Prescriptions will be provided in hospital at time of discharge  Theresa Duty, PA-C Orthopedic Surgery [facility] [place]

## 2018-09-28 LAB — ABO/RH: ABO/RH(D): O POS

## 2018-09-28 NOTE — Progress Notes (Signed)
Per nurse Dolores Lory, chart placed in nurse basket after this RN has checked labs

## 2018-09-28 NOTE — Progress Notes (Signed)
Anesthesia Chart Review   Case:  [id number] Date/Time:  10/04/18 1225   Procedure:  RIGHT TOTAL KNEE ARTHROPLASTY (Right Knee) - 72min   Anesthesia type:  Choice   Pre-op diagnosis:  right knee osteoarthritis   Location:  [facility] ROOM 10 / [facility]   Surgeon:  Gaynelle Arabian, MD      DISCUSSION: 78 yo former smoker (20 pack years, quit 01/10/96) with h/o HTN, CAD, OSA w/CPAP, DM II, HLD, CHF, right knee OA scheduled for above surgery on 10/04/18 with Dr. Gaynelle Arabian.   Right knee arthroplasty previously postponed due to A1C of 7.8.  DM medications adjusted by PCP and pt has been actively losing weight.  A1C at PST visit 09/27/18 6.5.   Pt seen by cardiologist 05/24/18 for cardiac clearance.  Stress test done 06/08/18 which was a low risk study.  Per Richardson Dopp, PA-C on results, "He may proceed with his planned surgery at acceptable risk."  Platelets at PST visit 09/27/18 84, appear to be chronically low.  Will repeat CBC DOS.  Discussed with Dr. Therisa Doyne.  VS: BP (!) 142/69   Pulse 81   Temp 36.8 C (Oral)   Resp 20   Ht 5\' 11"  (1.803 m)   Wt 129.3 kg   SpO2 95%   BMI 39.75 kg/m   PROVIDERS: [name], NP is PCP last seen 07/13/18  Allegra Lai, MD is Cardiologist  LABS: Labs reviewed: Acceptable for surgery. (all labs ordered are listed, but only abnormal results are displayed)  Labs Reviewed  CBC - Abnormal; Notable for the following components:      Result Value   Platelets 84 (*)    All other components within normal limits  COMPREHENSIVE METABOLIC PANEL - Abnormal; Notable for the following components:   Glucose, Bld 119 (*)    BUN 24 (*)    Calcium 8.7 (*)    ALT 48 (*)    All other components within normal limits  HEMOGLOBIN A1C - Abnormal; Notable for the following components:   Hgb A1c MFr Bld 6.5 (*)    All other components within normal limits  GLUCOSE, CAPILLARY - Abnormal; Notable for the following components:   Glucose-Capillary 109 (*)    All other  components within normal limits  SURGICAL PCR SCREEN  APTT  PROTIME-INR  TYPE AND SCREEN  ABO/RH     IMAGES:   EKG: 05/24/18 Rate 77 bpm Sinus rhythm with Possible premature atrial complexes with aberrant conduction Otherwise normal ECG  CV: Echo 01/11/18 Study Conclusions  - Left ventricle: The cavity size was normal. Wall thickness was   normal. Systolic function was normal. The estimated ejection   fraction was in the range of 60% to 65%. Wall motion was normal;   there were no regional wall motion abnormalities. Doppler   parameters are consistent with abnormal left ventricular   relaxation (grade 1 diastolic dysfunction). - Aortic valve: There was trivial regurgitation. - Pulmonary arteries: Systolic pressure was mildly increased. PA   peak pressure: 41 mm Hg (S).  Impressions:  - Normal LV systolic function; mild diastolic dysfunction; trace   AI; trace TR with mild pulmonary hypertension.  Stress Test 06/08/18  The left ventricular ejection fraction is normal (55-65%).  Nuclear stress EF: 58%.  There was no ST segment deviation noted during stress.  The study is normal.  This is a low risk study.    Past Medical History:  Diagnosis Date  . AI (aortic insufficiency) 01/11/2018  Trace, noted on ECHO  . Arthritis   . CAD in native artery    Nuclear stress test 10/19: EF 58, normal perfusion, low risk  . Cellulitis   . Dermatitis   . Diabetes mellitus    type II  . Diastolic dysfunction 59/93/5701   Mild, noted on ECHO  . Diverticulosis 01/31/2016   ASCENDING COLON AND CECUM, NOTED ON COLONOSCOPY  . DOE (dyspnea on exertion)   . Edema   . Fatty liver 09/18/2004   Noted on [place] ABD  . History of kidney stones 05/23/2002   Noted on CT Abd  . Hx of colonic polyps 01/31/2016  . Hyperlipidemia   . Hypertension   . Internal hemorrhoids 01/31/2016   Noted on colonoscopy  . Obesity   . OSA (obstructive sleep apnea)    cpap  . Positional  vertigo   . Pulmonary hypertension ([place]) 01/11/2018   Mild, noted on ECHO  . Rhinosinusitis   . Skin lesion   . TR (tricuspid regurgitation) 01/11/2018   Trace, noted on ECHO    Past Surgical History:  Procedure Laterality Date  . CARDIAC CATHETERIZATION    . COLONOSCOPY    . CORONARY STENT PLACEMENT     3 stents /1997  . POLYPECTOMY    . TONSILLECTOMY      MEDICATIONS: . albuterol (PROVENTIL HFA;VENTOLIN HFA) 108 (90 Base) MCG/ACT inhaler  . amLODipine (NORVASC) 2.5 MG tablet  . aspirin 81 MG tablet  . BD PEN NEEDLE NANO U/F 32G X 4 MM MISC  . fluticasone (FLONASE) 50 MCG/ACT nasal spray  . glipiZIDE (GLUCOTROL XL) 10 MG 24 hr tablet  . Insulin Glargine (BASAGLAR KWIKPEN) 100 UNIT/ML SOPN  . meclizine (ANTIVERT) 12.5 MG tablet  . simvastatin (ZOCOR) 20 MG tablet  . tamsulosin (FLOMAX) 0.4 MG CAPS capsule  . TOVIAZ 8 MG TB24 tablet  . VICTOZA 18 MG/3ML SOPN   No current facility-administered medications for this encounter.     Maia Plan [facility] Pre-Surgical Testing [phone] 09/29/18 1:23 PM

## 2018-09-29 NOTE — Progress Notes (Addendum)
PCP: Nani Skillern, NP  CARDIOLOGIST: Dr. Allegra Lai  INFO IN Epic:The following are in epic:  Cardiac clearance 05/24/2018 S. Weaver P.A.  EKG 05/24/2018  Last office visit Nafziger P.A. 07/13/2018  Stress test 06/07/2018  ECHO 01/11/2018  CXR 03/25/2018  Current Labs -CBC w/ Platlets of 84,000  INFO ON CHART:  BLOOD THINNERS AND LAST DOSES: Pt plan to hold for the procedure 5 days prior to surgery ____________________________________  PATIENT SYMPTOMS AT TIME OF PREOP: Asymptomatic

## 2018-09-30 NOTE — Anesthesia Preprocedure Evaluation (Addendum)
Anesthesia Evaluation  Patient identified by MRN, date of birth, ID band Patient awake    Reviewed: Allergy & Precautions, NPO status , Patient's Chart, lab work & pertinent test results  Airway Mallampati: II  TM Distance: >3 FB Neck ROM: Full    Dental no notable dental hx.    Pulmonary sleep apnea , former smoker,    Pulmonary exam normal breath sounds clear to auscultation       Cardiovascular hypertension, Pt. on medications + CAD and + DOE  Normal cardiovascular exam Rhythm:Regular Rate:Normal     Neuro/Psych  Neuromuscular disease negative psych ROS   GI/Hepatic negative GI ROS, Neg liver ROS,   Endo/Other  negative endocrine ROSdiabetes  Renal/GU negative Renal ROS     Musculoskeletal  (+) Arthritis ,   Abdominal (+) + obese,   Peds  Hematology negative hematology ROS (+)   Anesthesia Other Findings   Reproductive/Obstetrics negative OB ROS                           Anesthesia Physical Anesthesia Plan  ASA: III  Anesthesia Plan: General   Post-op Pain Management:    Induction: Intravenous  PONV Risk Score and Plan: 3 and Ondansetron, Treatment may vary due to age or medical condition and Dexamethasone  Airway Management Planned: Oral ETT  Additional Equipment: None  Intra-op Plan:   Post-operative Plan: Extubation in OR  Informed Consent: I have reviewed the patients History and Physical, chart, labs and discussed the procedure including the risks, benefits and alternatives for the proposed anesthesia with the patient or authorized representative who has indicated his/her understanding and acceptance.     Dental advisory given  Plan Discussed with: CRNA  Anesthesia Plan Comments: (See PST note 09/27/2018, Konrad Felix, PA-C)     Anesthesia Quick Evaluation

## 2018-10-01 ENCOUNTER — Other Ambulatory Visit: Payer: Self-pay | Admitting: Adult Health

## 2018-10-03 MED ORDER — CEFAZOLIN SODIUM 10 G IJ SOLR
3.0000 g | INTRAMUSCULAR | Status: AC
  Administered 2018-10-04: 3 g via INTRAVENOUS
  Filled 2018-10-03: qty 3

## 2018-10-03 MED ORDER — BUPIVACAINE LIPOSOME 1.3 % IJ SUSP
20.0000 mL | Freq: Once | INTRAMUSCULAR | Status: DC
  Filled 2018-10-03: qty 20

## 2018-10-04 ENCOUNTER — Inpatient Hospital Stay (HOSPITAL_COMMUNITY): Payer: Medicare Other | Admitting: Physician Assistant

## 2018-10-04 ENCOUNTER — Inpatient Hospital Stay (HOSPITAL_COMMUNITY)
Admission: RE | Admit: 2018-10-04 | Discharge: 2018-10-11 | DRG: 470 | Disposition: A | Discharge status: Skilled Nursing Facility | Payer: Medicare Other | Attending: Orthopedic Surgery | Admitting: Orthopedic Surgery

## 2018-10-04 ENCOUNTER — Encounter (HOSPITAL_COMMUNITY): Payer: Self-pay | Admitting: *Deleted

## 2018-10-04 ENCOUNTER — Other Ambulatory Visit: Payer: Self-pay

## 2018-10-04 ENCOUNTER — Encounter (HOSPITAL_COMMUNITY)
Admission: RE | Disposition: A | Discharge status: Skilled Nursing Facility | Payer: Self-pay | Source: Home / Self Care | Attending: Orthopedic Surgery

## 2018-10-04 DIAGNOSIS — R339 Retention of urine, unspecified: Secondary | ICD-10-CM | POA: Diagnosis not present

## 2018-10-04 DIAGNOSIS — I2510 Atherosclerotic heart disease of native coronary artery without angina pectoris: Secondary | ICD-10-CM | POA: Diagnosis not present

## 2018-10-04 DIAGNOSIS — R0602 Shortness of breath: Secondary | ICD-10-CM | POA: Diagnosis not present

## 2018-10-04 DIAGNOSIS — Z87442 Personal history of urinary calculi: Secondary | ICD-10-CM

## 2018-10-04 DIAGNOSIS — Z888 Allergy status to other drugs, medicaments and biological substances status: Secondary | ICD-10-CM

## 2018-10-04 DIAGNOSIS — M179 Osteoarthritis of knee, unspecified: Secondary | ICD-10-CM | POA: Diagnosis present

## 2018-10-04 DIAGNOSIS — M62838 Other muscle spasm: Secondary | ICD-10-CM | POA: Diagnosis not present

## 2018-10-04 DIAGNOSIS — E119 Type 2 diabetes mellitus without complications: Secondary | ICD-10-CM | POA: Diagnosis present

## 2018-10-04 DIAGNOSIS — Z794 Long term (current) use of insulin: Secondary | ICD-10-CM

## 2018-10-04 DIAGNOSIS — R0600 Dyspnea, unspecified: Secondary | ICD-10-CM

## 2018-10-04 DIAGNOSIS — Z8249 Family history of ischemic heart disease and other diseases of the circulatory system: Secondary | ICD-10-CM | POA: Diagnosis not present

## 2018-10-04 DIAGNOSIS — N9989 Other postprocedural complications and disorders of genitourinary system: Secondary | ICD-10-CM | POA: Diagnosis not present

## 2018-10-04 DIAGNOSIS — K5900 Constipation, unspecified: Secondary | ICD-10-CM | POA: Diagnosis not present

## 2018-10-04 DIAGNOSIS — N400 Enlarged prostate without lower urinary tract symptoms: Secondary | ICD-10-CM | POA: Diagnosis not present

## 2018-10-04 DIAGNOSIS — Z801 Family history of malignant neoplasm of trachea, bronchus and lung: Secondary | ICD-10-CM | POA: Diagnosis not present

## 2018-10-04 DIAGNOSIS — E669 Obesity, unspecified: Secondary | ICD-10-CM | POA: Diagnosis present

## 2018-10-04 DIAGNOSIS — Z4789 Encounter for other orthopedic aftercare: Secondary | ICD-10-CM | POA: Diagnosis not present

## 2018-10-04 DIAGNOSIS — I2729 Other secondary pulmonary hypertension: Secondary | ICD-10-CM | POA: Diagnosis not present

## 2018-10-04 DIAGNOSIS — M19049 Primary osteoarthritis, unspecified hand: Secondary | ICD-10-CM | POA: Diagnosis not present

## 2018-10-04 DIAGNOSIS — D696 Thrombocytopenia, unspecified: Secondary | ICD-10-CM | POA: Diagnosis not present

## 2018-10-04 DIAGNOSIS — I082 Rheumatic disorders of both aortic and tricuspid valves: Secondary | ICD-10-CM | POA: Diagnosis present

## 2018-10-04 DIAGNOSIS — R278 Other lack of coordination: Secondary | ICD-10-CM | POA: Diagnosis not present

## 2018-10-04 DIAGNOSIS — Z7982 Long term (current) use of aspirin: Secondary | ICD-10-CM

## 2018-10-04 DIAGNOSIS — E785 Hyperlipidemia, unspecified: Secondary | ICD-10-CM | POA: Diagnosis not present

## 2018-10-04 DIAGNOSIS — Z87891 Personal history of nicotine dependence: Secondary | ICD-10-CM | POA: Diagnosis not present

## 2018-10-04 DIAGNOSIS — Z96651 Presence of right artificial knee joint: Secondary | ICD-10-CM | POA: Diagnosis not present

## 2018-10-04 DIAGNOSIS — M1711 Unilateral primary osteoarthritis, right knee: Secondary | ICD-10-CM | POA: Diagnosis not present

## 2018-10-04 DIAGNOSIS — L309 Dermatitis, unspecified: Secondary | ICD-10-CM | POA: Diagnosis not present

## 2018-10-04 DIAGNOSIS — Z86010 Personal history of colonic polyps: Secondary | ICD-10-CM

## 2018-10-04 DIAGNOSIS — R42 Dizziness and giddiness: Secondary | ICD-10-CM | POA: Diagnosis not present

## 2018-10-04 DIAGNOSIS — Z955 Presence of coronary angioplasty implant and graft: Secondary | ICD-10-CM | POA: Diagnosis not present

## 2018-10-04 DIAGNOSIS — Z471 Aftercare following joint replacement surgery: Secondary | ICD-10-CM | POA: Diagnosis not present

## 2018-10-04 DIAGNOSIS — M25561 Pain in right knee: Secondary | ICD-10-CM | POA: Diagnosis not present

## 2018-10-04 DIAGNOSIS — I272 Pulmonary hypertension, unspecified: Secondary | ICD-10-CM | POA: Diagnosis not present

## 2018-10-04 DIAGNOSIS — M2550 Pain in unspecified joint: Secondary | ICD-10-CM | POA: Diagnosis not present

## 2018-10-04 DIAGNOSIS — Z6839 Body mass index (BMI) 39.0-39.9, adult: Secondary | ICD-10-CM | POA: Diagnosis not present

## 2018-10-04 DIAGNOSIS — N3281 Overactive bladder: Secondary | ICD-10-CM | POA: Diagnosis not present

## 2018-10-04 DIAGNOSIS — G4733 Obstructive sleep apnea (adult) (pediatric): Secondary | ICD-10-CM | POA: Diagnosis present

## 2018-10-04 DIAGNOSIS — G8918 Other acute postprocedural pain: Secondary | ICD-10-CM | POA: Diagnosis not present

## 2018-10-04 DIAGNOSIS — Z79899 Other long term (current) drug therapy: Secondary | ICD-10-CM | POA: Diagnosis not present

## 2018-10-04 DIAGNOSIS — K760 Fatty (change of) liver, not elsewhere classified: Secondary | ICD-10-CM | POA: Diagnosis not present

## 2018-10-04 DIAGNOSIS — R062 Wheezing: Secondary | ICD-10-CM | POA: Diagnosis not present

## 2018-10-04 DIAGNOSIS — I351 Nonrheumatic aortic (valve) insufficiency: Secondary | ICD-10-CM | POA: Diagnosis not present

## 2018-10-04 DIAGNOSIS — Z7401 Bed confinement status: Secondary | ICD-10-CM | POA: Diagnosis not present

## 2018-10-04 DIAGNOSIS — I10 Essential (primary) hypertension: Secondary | ICD-10-CM | POA: Diagnosis not present

## 2018-10-04 DIAGNOSIS — Z833 Family history of diabetes mellitus: Secondary | ICD-10-CM | POA: Diagnosis not present

## 2018-10-04 DIAGNOSIS — E1165 Type 2 diabetes mellitus with hyperglycemia: Secondary | ICD-10-CM | POA: Diagnosis not present

## 2018-10-04 DIAGNOSIS — M1710 Unilateral primary osteoarthritis, unspecified knee: Secondary | ICD-10-CM | POA: Diagnosis present

## 2018-10-04 HISTORY — PX: TOTAL KNEE ARTHROPLASTY: SHX125

## 2018-10-04 LAB — GLUCOSE, CAPILLARY
GLUCOSE-CAPILLARY: 117 mg/dL — AB (ref 70–99)
GLUCOSE-CAPILLARY: 191 mg/dL — AB (ref 70–99)
Glucose-Capillary: 114 mg/dL — ABNORMAL HIGH (ref 70–99)
Glucose-Capillary: 178 mg/dL — ABNORMAL HIGH (ref 70–99)

## 2018-10-04 LAB — TYPE AND SCREEN
ABO/RH(D): O POS
Antibody Screen: NEGATIVE

## 2018-10-04 LAB — CBC
HCT: 40.4 % (ref 39.0–52.0)
Hemoglobin: 13.5 g/dL (ref 13.0–17.0)
MCH: 32.4 pg (ref 26.0–34.0)
MCHC: 33.4 g/dL (ref 30.0–36.0)
MCV: 96.9 fL (ref 80.0–100.0)
PLATELETS: 85 10*3/uL — AB (ref 150–400)
RBC: 4.17 MIL/uL — ABNORMAL LOW (ref 4.22–5.81)
RDW: 13.1 % (ref 11.5–15.5)
WBC: 6.3 10*3/uL (ref 4.0–10.5)
nRBC: 0 % (ref 0.0–0.2)

## 2018-10-04 SURGERY — ARTHROPLASTY, KNEE, TOTAL
Anesthesia: General | Site: Knee | Laterality: Right

## 2018-10-04 MED ORDER — MORPHINE SULFATE (PF) 2 MG/ML IV SOLN
1.0000 mg | INTRAVENOUS | Status: DC | PRN
  Administered 2018-10-05: 1 mg via INTRAVENOUS
  Filled 2018-10-04: qty 1

## 2018-10-04 MED ORDER — ALBUTEROL SULFATE (2.5 MG/3ML) 0.083% IN NEBU: 3.0000 mL | INHALATION_SOLUTION | Freq: Four times a day (QID) | RESPIRATORY_TRACT | Status: DC | PRN

## 2018-10-04 MED ORDER — ACETAMINOPHEN 500 MG PO TABS
1000.0000 mg | ORAL_TABLET | Freq: Four times a day (QID) | ORAL | Status: AC
  Administered 2018-10-04 – 2018-10-05 (×4): 1000 mg via ORAL
  Filled 2018-10-04 (×4): qty 2

## 2018-10-04 MED ORDER — SUGAMMADEX SODIUM 200 MG/2ML IV SOLN
INTRAVENOUS | Status: DC | PRN
  Administered 2018-10-04: 300 mg via INTRAVENOUS

## 2018-10-04 MED ORDER — TRANEXAMIC ACID-NACL 1000-0.7 MG/100ML-% IV SOLN
1000.0000 mg | INTRAVENOUS | Status: AC
  Administered 2018-10-04: 1000 mg via INTRAVENOUS
  Filled 2018-10-04: qty 100

## 2018-10-04 MED ORDER — MECLIZINE HCL 25 MG PO TABS: 12.5000 mg | ORAL_TABLET | Freq: Three times a day (TID) | ORAL | Status: DC | PRN

## 2018-10-04 MED ORDER — OXYCODONE HCL 5 MG PO TABS
5.0000 mg | ORAL_TABLET | ORAL | Status: DC | PRN
  Administered 2018-10-04 – 2018-10-06 (×6): 10 mg via ORAL
  Filled 2018-10-04 (×6): qty 2

## 2018-10-04 MED ORDER — FLUTICASONE PROPIONATE 50 MCG/ACT NA SUSP
2.0000 | Freq: Every day | NASAL | Status: DC | PRN
  Filled 2018-10-04: qty 16

## 2018-10-04 MED ORDER — DIPHENHYDRAMINE HCL 12.5 MG/5ML PO ELIX: 12.5000 mg | ORAL_SOLUTION | ORAL | Status: DC | PRN

## 2018-10-04 MED ORDER — DEXAMETHASONE SODIUM PHOSPHATE 10 MG/ML IJ SOLN: 8.0000 mg | Freq: Once | INTRAMUSCULAR | Status: DC

## 2018-10-04 MED ORDER — ONDANSETRON HCL 4 MG/2ML IJ SOLN
4.0000 mg | Freq: Four times a day (QID) | INTRAMUSCULAR | Status: DC | PRN
  Administered 2018-10-04: 4 mg via INTRAVENOUS
  Filled 2018-10-04: qty 2

## 2018-10-04 MED ORDER — EPHEDRINE SULFATE-NACL 50-0.9 MG/10ML-% IV SOSY
PREFILLED_SYRINGE | INTRAVENOUS | Status: DC | PRN
  Administered 2018-10-04: 10 mg via INTRAVENOUS

## 2018-10-04 MED ORDER — HYDROMORPHONE HCL 1 MG/ML IJ SOLN
INTRAMUSCULAR | Status: AC
  Filled 2018-10-04: qty 1

## 2018-10-04 MED ORDER — METHOCARBAMOL 500 MG IVPB - SIMPLE MED
500.0000 mg | Freq: Four times a day (QID) | INTRAVENOUS | Status: DC | PRN
  Administered 2018-10-04: 500 mg via INTRAVENOUS
  Filled 2018-10-04: qty 50

## 2018-10-04 MED ORDER — DOCUSATE SODIUM 100 MG PO CAPS
100.0000 mg | ORAL_CAPSULE | Freq: Two times a day (BID) | ORAL | Status: DC
  Administered 2018-10-04 – 2018-10-11 (×13): 100 mg via ORAL
  Filled 2018-10-04 (×13): qty 1

## 2018-10-04 MED ORDER — FENTANYL CITRATE (PF) 100 MCG/2ML IJ SOLN
INTRAMUSCULAR | Status: AC
  Filled 2018-10-04: qty 2

## 2018-10-04 MED ORDER — BASAGLAR KWIKPEN 100 UNIT/ML ~~LOC~~ SOPN: 40.0000 [IU] | PEN_INJECTOR | SUBCUTANEOUS | Status: DC

## 2018-10-04 MED ORDER — CHLORHEXIDINE GLUCONATE 4 % EX LIQD: 60.0000 mL | Freq: Once | CUTANEOUS | Status: DC

## 2018-10-04 MED ORDER — TRAMADOL HCL 50 MG PO TABS
50.0000 mg | ORAL_TABLET | Freq: Four times a day (QID) | ORAL | Status: DC | PRN
  Administered 2018-10-06: 100 mg via ORAL
  Administered 2018-10-08 – 2018-10-09 (×3): 50 mg via ORAL
  Administered 2018-10-10: 100 mg via ORAL
  Filled 2018-10-04: qty 1
  Filled 2018-10-04 (×2): qty 2
  Filled 2018-10-04 (×2): qty 1

## 2018-10-04 MED ORDER — SIMVASTATIN 20 MG PO TABS
20.0000 mg | ORAL_TABLET | Freq: Every day | ORAL | Status: DC
  Administered 2018-10-04 – 2018-10-10 (×7): 20 mg via ORAL
  Filled 2018-10-04 (×7): qty 1

## 2018-10-04 MED ORDER — PHENYLEPHRINE 40 MCG/ML (10ML) SYRINGE FOR IV PUSH (FOR BLOOD PRESSURE SUPPORT)
PREFILLED_SYRINGE | INTRAVENOUS | Status: AC
  Filled 2018-10-04: qty 10

## 2018-10-04 MED ORDER — MEPERIDINE HCL 50 MG/ML IJ SOLN: 6.2500 mg | INTRAMUSCULAR | Status: DC | PRN

## 2018-10-04 MED ORDER — HYDROMORPHONE HCL 1 MG/ML IJ SOLN
0.2500 mg | INTRAMUSCULAR | Status: DC | PRN
  Administered 2018-10-04 (×4): 0.5 mg via INTRAVENOUS

## 2018-10-04 MED ORDER — PROMETHAZINE HCL 25 MG/ML IJ SOLN: 6.2500 mg | INTRAMUSCULAR | Status: DC | PRN

## 2018-10-04 MED ORDER — ASPIRIN EC 325 MG PO TBEC
325.0000 mg | DELAYED_RELEASE_TABLET | Freq: Two times a day (BID) | ORAL | Status: DC
  Administered 2018-10-05 – 2018-10-11 (×13): 325 mg via ORAL
  Filled 2018-10-04 (×13): qty 1

## 2018-10-04 MED ORDER — TRANEXAMIC ACID-NACL 1000-0.7 MG/100ML-% IV SOLN
1000.0000 mg | Freq: Once | INTRAVENOUS | Status: AC
  Administered 2018-10-04: 1000 mg via INTRAVENOUS
  Filled 2018-10-04: qty 100

## 2018-10-04 MED ORDER — PHENOL 1.4 % MT LIQD
1.0000 | OROMUCOSAL | Status: DC | PRN
  Filled 2018-10-04: qty 177

## 2018-10-04 MED ORDER — FENTANYL CITRATE (PF) 100 MCG/2ML IJ SOLN
50.0000 ug | INTRAMUSCULAR | Status: DC
  Administered 2018-10-04 (×2): 50 ug via INTRAVENOUS
  Filled 2018-10-04: qty 2

## 2018-10-04 MED ORDER — SODIUM CHLORIDE 0.9 % IV SOLN
INTRAVENOUS | Status: DC
  Administered 2018-10-04 – 2018-10-05 (×2): via INTRAVENOUS

## 2018-10-04 MED ORDER — DEXAMETHASONE SODIUM PHOSPHATE 10 MG/ML IJ SOLN
INTRAMUSCULAR | Status: DC | PRN
  Administered 2018-10-04: 8 mg via INTRAVENOUS

## 2018-10-04 MED ORDER — ONDANSETRON HCL 4 MG/2ML IJ SOLN
INTRAMUSCULAR | Status: AC
  Filled 2018-10-04: qty 2

## 2018-10-04 MED ORDER — DEXAMETHASONE SODIUM PHOSPHATE 10 MG/ML IJ SOLN
INTRAMUSCULAR | Status: AC
  Filled 2018-10-04: qty 1

## 2018-10-04 MED ORDER — BUPIVACAINE HCL (PF) 0.5 % IJ SOLN
INTRAMUSCULAR | Status: DC | PRN
  Administered 2018-10-04: 20 mL via PERINEURAL

## 2018-10-04 MED ORDER — BISACODYL 10 MG RE SUPP
10.0000 mg | Freq: Every day | RECTAL | Status: DC | PRN
  Administered 2018-10-07: 10 mg via RECTAL
  Filled 2018-10-04: qty 1

## 2018-10-04 MED ORDER — GABAPENTIN 300 MG PO CAPS
300.0000 mg | ORAL_CAPSULE | Freq: Three times a day (TID) | ORAL | Status: DC
  Administered 2018-10-04 – 2018-10-11 (×21): 300 mg via ORAL
  Filled 2018-10-04 (×21): qty 1

## 2018-10-04 MED ORDER — METOCLOPRAMIDE HCL 5 MG/ML IJ SOLN: 5.0000 mg | Freq: Three times a day (TID) | INTRAMUSCULAR | Status: DC | PRN

## 2018-10-04 MED ORDER — METHOCARBAMOL 500 MG IVPB - SIMPLE MED
INTRAVENOUS | Status: AC
  Filled 2018-10-04: qty 50

## 2018-10-04 MED ORDER — ROCURONIUM BROMIDE 50 MG/5ML IV SOSY
PREFILLED_SYRINGE | INTRAVENOUS | Status: DC | PRN
  Administered 2018-10-04: 50 mg via INTRAVENOUS

## 2018-10-04 MED ORDER — ROCURONIUM BROMIDE 100 MG/10ML IV SOLN
INTRAVENOUS | Status: AC
  Filled 2018-10-04: qty 1

## 2018-10-04 MED ORDER — LACTATED RINGERS IV SOLN
INTRAVENOUS | Status: DC
  Administered 2018-10-04 (×2): via INTRAVENOUS

## 2018-10-04 MED ORDER — SODIUM CHLORIDE (PF) 0.9 % IJ SOLN
INTRAMUSCULAR | Status: AC
  Filled 2018-10-04: qty 10

## 2018-10-04 MED ORDER — METHOCARBAMOL 500 MG PO TABS
500.0000 mg | ORAL_TABLET | Freq: Four times a day (QID) | ORAL | Status: DC | PRN
  Administered 2018-10-04 – 2018-10-08 (×6): 500 mg via ORAL
  Filled 2018-10-04 (×7): qty 1

## 2018-10-04 MED ORDER — DEXAMETHASONE SODIUM PHOSPHATE 10 MG/ML IJ SOLN
10.0000 mg | Freq: Once | INTRAMUSCULAR | Status: DC
  Filled 2018-10-04: qty 1

## 2018-10-04 MED ORDER — SODIUM CHLORIDE (PF) 0.9 % IJ SOLN
INTRAMUSCULAR | Status: AC
  Filled 2018-10-04: qty 50

## 2018-10-04 MED ORDER — TAMSULOSIN HCL 0.4 MG PO CAPS
0.4000 mg | ORAL_CAPSULE | Freq: Every day | ORAL | Status: DC
  Administered 2018-10-05 – 2018-10-11 (×7): 0.4 mg via ORAL
  Filled 2018-10-04 (×7): qty 1

## 2018-10-04 MED ORDER — GABAPENTIN 300 MG PO CAPS
300.0000 mg | ORAL_CAPSULE | Freq: Once | ORAL | Status: AC
  Administered 2018-10-04: 300 mg via ORAL
  Filled 2018-10-04: qty 1

## 2018-10-04 MED ORDER — MENTHOL 3 MG MT LOZG: 1.0000 | LOZENGE | OROMUCOSAL | Status: DC | PRN

## 2018-10-04 MED ORDER — PROPOFOL 10 MG/ML IV BOLUS
INTRAVENOUS | Status: AC
  Filled 2018-10-04: qty 40

## 2018-10-04 MED ORDER — INSULIN GLARGINE 100 UNIT/ML ~~LOC~~ SOLN
40.0000 [IU] | Freq: Every day | SUBCUTANEOUS | Status: DC
  Administered 2018-10-05 – 2018-10-11 (×7): 40 [IU] via SUBCUTANEOUS
  Filled 2018-10-04 (×7): qty 0.4

## 2018-10-04 MED ORDER — STERILE WATER FOR IRRIGATION IR SOLN
Status: DC | PRN
  Administered 2018-10-04: 2000 mL

## 2018-10-04 MED ORDER — SODIUM CHLORIDE (PF) 0.9 % IJ SOLN
INTRAMUSCULAR | Status: DC | PRN
  Administered 2018-10-04: 60 mL

## 2018-10-04 MED ORDER — FLEET ENEMA 7-19 GM/118ML RE ENEM
1.0000 | ENEMA | Freq: Once | RECTAL | Status: AC | PRN
  Administered 2018-10-07: 1 via RECTAL
  Filled 2018-10-04: qty 1

## 2018-10-04 MED ORDER — METOCLOPRAMIDE HCL 5 MG PO TABS: 5.0000 mg | ORAL_TABLET | Freq: Three times a day (TID) | ORAL | Status: DC | PRN

## 2018-10-04 MED ORDER — AMLODIPINE BESYLATE 5 MG PO TABS
2.5000 mg | ORAL_TABLET | Freq: Every day | ORAL | Status: DC
  Administered 2018-10-05 – 2018-10-11 (×7): 2.5 mg via ORAL
  Filled 2018-10-04 (×7): qty 1

## 2018-10-04 MED ORDER — PROPOFOL 10 MG/ML IV BOLUS
INTRAVENOUS | Status: DC | PRN
  Administered 2018-10-04: 160 mg via INTRAVENOUS

## 2018-10-04 MED ORDER — ONDANSETRON HCL 4 MG/2ML IJ SOLN
INTRAMUSCULAR | Status: DC | PRN
  Administered 2018-10-04: 4 mg via INTRAVENOUS

## 2018-10-04 MED ORDER — BUPIVACAINE LIPOSOME 1.3 % IJ SUSP
INTRAMUSCULAR | Status: DC | PRN
  Administered 2018-10-04: 20 mL

## 2018-10-04 MED ORDER — SODIUM CHLORIDE 0.9 % IR SOLN
Status: DC | PRN
  Administered 2018-10-04: 1000 mL

## 2018-10-04 MED ORDER — ACETAMINOPHEN 10 MG/ML IV SOLN
1000.0000 mg | Freq: Four times a day (QID) | INTRAVENOUS | Status: DC
  Administered 2018-10-04: 1000 mg via INTRAVENOUS
  Filled 2018-10-04: qty 100

## 2018-10-04 MED ORDER — FESOTERODINE FUMARATE ER 8 MG PO TB24
8.0000 mg | ORAL_TABLET | Freq: Every day | ORAL | Status: DC
  Administered 2018-10-05 – 2018-10-10 (×3): 8 mg via ORAL
  Filled 2018-10-04 (×7): qty 1

## 2018-10-04 MED ORDER — LIDOCAINE 2% (20 MG/ML) 5 ML SYRINGE
INTRAMUSCULAR | Status: DC | PRN
  Administered 2018-10-04: 80 mg via INTRAVENOUS

## 2018-10-04 MED ORDER — FENTANYL CITRATE (PF) 100 MCG/2ML IJ SOLN
INTRAMUSCULAR | Status: DC | PRN
  Administered 2018-10-04 (×2): 50 ug via INTRAVENOUS

## 2018-10-04 MED ORDER — ONDANSETRON HCL 4 MG PO TABS: 4.0000 mg | ORAL_TABLET | Freq: Four times a day (QID) | ORAL | Status: DC | PRN

## 2018-10-04 MED ORDER — MIDAZOLAM HCL 2 MG/2ML IJ SOLN
1.0000 mg | INTRAMUSCULAR | Status: DC
  Filled 2018-10-04: qty 2

## 2018-10-04 MED ORDER — POLYETHYLENE GLYCOL 3350 17 G PO PACK
17.0000 g | PACK | Freq: Every day | ORAL | Status: DC | PRN
  Administered 2018-10-07: 17 g via ORAL
  Filled 2018-10-04: qty 1

## 2018-10-04 MED ORDER — PHENYLEPHRINE 40 MCG/ML (10ML) SYRINGE FOR IV PUSH (FOR BLOOD PRESSURE SUPPORT)
PREFILLED_SYRINGE | INTRAVENOUS | Status: DC | PRN
  Administered 2018-10-04: 80 ug via INTRAVENOUS
  Administered 2018-10-04: 120 ug via INTRAVENOUS
  Administered 2018-10-04: 80 ug via INTRAVENOUS

## 2018-10-04 MED ORDER — LIRAGLUTIDE 18 MG/3ML ~~LOC~~ SOPN
1.2000 mg | PEN_INJECTOR | Freq: Every day | SUBCUTANEOUS | Status: DC
  Administered 2018-10-10: 1.2 mg via SUBCUTANEOUS

## 2018-10-04 MED ORDER — CEFAZOLIN SODIUM-DEXTROSE 2-4 GM/100ML-% IV SOLN
2.0000 g | Freq: Four times a day (QID) | INTRAVENOUS | Status: AC
  Administered 2018-10-04 (×2): 2 g via INTRAVENOUS
  Filled 2018-10-04 (×2): qty 100

## 2018-10-04 MED ORDER — INSULIN ASPART 100 UNIT/ML ~~LOC~~ SOLN
0.0000 [IU] | Freq: Three times a day (TID) | SUBCUTANEOUS | Status: DC
  Administered 2018-10-05: 3 [IU] via SUBCUTANEOUS
  Administered 2018-10-05 (×2): 2 [IU] via SUBCUTANEOUS
  Administered 2018-10-06: 5 [IU] via SUBCUTANEOUS
  Administered 2018-10-06 (×2): 3 [IU] via SUBCUTANEOUS
  Administered 2018-10-07: 5 [IU] via SUBCUTANEOUS
  Administered 2018-10-07 – 2018-10-08 (×3): 3 [IU] via SUBCUTANEOUS
  Administered 2018-10-08: 2 [IU] via SUBCUTANEOUS
  Administered 2018-10-08 – 2018-10-09 (×2): 3 [IU] via SUBCUTANEOUS
  Administered 2018-10-09: 2 [IU] via SUBCUTANEOUS
  Administered 2018-10-09 – 2018-10-10 (×3): 3 [IU] via SUBCUTANEOUS
  Administered 2018-10-10: 5 [IU] via SUBCUTANEOUS
  Administered 2018-10-11 (×2): 3 [IU] via SUBCUTANEOUS

## 2018-10-04 SURGICAL SUPPLY — 55 items
BAG SPEC THK2 15X12 ZIP CLS (MISCELLANEOUS) ×1
BAG ZIPLOCK 12X15 (MISCELLANEOUS) ×3 IMPLANT
BANDAGE ACE 6X5 VEL STRL LF (GAUZE/BANDAGES/DRESSINGS) ×3 IMPLANT
BLADE SAG 18X100X1.27 (BLADE) ×3 IMPLANT
BLADE SAW SGTL 11.0X1.19X90.0M (BLADE) ×3 IMPLANT
BLADE SURG SZ10 CARB STEEL (BLADE) ×6 IMPLANT
BOWL SMART MIX CTS (DISPOSABLE) ×3 IMPLANT
CEMENT HV SMART SET (Cement) ×6 IMPLANT
CEMENT TIBIA MBT SIZE 4 (Knees) IMPLANT
CLOSURE WOUND 1/2 X4 (GAUZE/BANDAGES/DRESSINGS) ×2
COVER SURGICAL LIGHT HANDLE (MISCELLANEOUS) ×3 IMPLANT
COVER WAND RF STERILE (DRAPES) IMPLANT
CUFF TOURN SGL QUICK 34 (TOURNIQUET CUFF) ×3
CUFF TRNQT CYL 34X4X40X1 (TOURNIQUET CUFF) ×1 IMPLANT
DECANTER SPIKE VIAL GLASS SM (MISCELLANEOUS) ×3 IMPLANT
DRAPE U-SHAPE 47X51 STRL (DRAPES) ×3 IMPLANT
DRSG ADAPTIC 3X8 NADH LF (GAUZE/BANDAGES/DRESSINGS) ×3 IMPLANT
DURAPREP 26ML APPLICATOR (WOUND CARE) ×3 IMPLANT
ELECT REM PT RETURN 15FT ADLT (MISCELLANEOUS) ×3 IMPLANT
EVACUATOR 1/8 PVC DRAIN (DRAIN) ×3 IMPLANT
FEMUR SIGMA PS SZ 5.0 R (Femur) ×2 IMPLANT
GAUZE SPONGE 4X4 12PLY STRL (GAUZE/BANDAGES/DRESSINGS) ×3 IMPLANT
GLOVE BIO SURGEON STRL SZ8 (GLOVE) ×3 IMPLANT
GLOVE BIOGEL PI IND STRL 6.5 (GLOVE) ×1 IMPLANT
GLOVE BIOGEL PI IND STRL 8 (GLOVE) ×1 IMPLANT
GLOVE BIOGEL PI INDICATOR 6.5 (GLOVE) ×2
GLOVE BIOGEL PI INDICATOR 8 (GLOVE) ×2
GLOVE SURG SS PI 6.5 STRL IVOR (GLOVE) ×3 IMPLANT
GOWN SPEC L3 XXLG W/TWL (GOWN DISPOSABLE) ×2 IMPLANT
GOWN STRL REIN 3XL LVL4 (GOWN DISPOSABLE) ×2 IMPLANT
GOWN STRL REUS W/TWL LRG LVL3 (GOWN DISPOSABLE) ×6 IMPLANT
GOWN STRL REUS W/TWL XL LVL3 (GOWN DISPOSABLE) ×3 IMPLANT
HANDPIECE INTERPULSE COAX TIP (DISPOSABLE) ×3
HOLDER FOLEY CATH W/STRAP (MISCELLANEOUS) IMPLANT
IMMOBILIZER KNEE 20 (SOFTGOODS) ×3
IMMOBILIZER KNEE 20 THIGH 36 (SOFTGOODS) ×1 IMPLANT
MANIFOLD NEPTUNE II (INSTRUMENTS) ×3 IMPLANT
PACK TOTAL KNEE CUSTOM (KITS) ×3 IMPLANT
PAD ABD 8X10 STRL (GAUZE/BANDAGES/DRESSINGS) ×2 IMPLANT
PADDING CAST COTTON 6X4 STRL (CAST SUPPLIES) ×9 IMPLANT
PATELLA DOME PFC 38MM (Knees) ×2 IMPLANT
PIN STEINMAN FIXATION KNEE (PIN) ×2 IMPLANT
PLATE ROT INSERT 12.5MM (Plate) ×2 IMPLANT
PROTECTOR NERVE ULNAR (MISCELLANEOUS) ×3 IMPLANT
SET HNDPC FAN SPRY TIP SCT (DISPOSABLE) ×1 IMPLANT
STRIP CLOSURE SKIN 1/2X4 (GAUZE/BANDAGES/DRESSINGS) ×4 IMPLANT
SUT MNCRL AB 4-0 PS2 18 (SUTURE) ×3 IMPLANT
SUT STRATAFIX 0 PDS 27 VIOLET (SUTURE) ×3
SUT VIC AB 2-0 CT1 27 (SUTURE) ×9
SUT VIC AB 2-0 CT1 TAPERPNT 27 (SUTURE) ×3 IMPLANT
SUTURE STRATFX 0 PDS 27 VIOLET (SUTURE) ×1 IMPLANT
TIBIA MBT CEMENT SIZE 4 (Knees) ×3 IMPLANT
TRAY FOLEY MTR SLVR 16FR STAT (SET/KITS/TRAYS/PACK) ×3 IMPLANT
WRAP KNEE MAXI GEL POST OP (GAUZE/BANDAGES/DRESSINGS) ×3 IMPLANT
YANKAUER SUCT BULB TIP 10FT TU (MISCELLANEOUS) ×3 IMPLANT

## 2018-10-04 NOTE — Anesthesia Procedure Notes (Addendum)
Procedure Name: Intubation Date/Time: 10/04/2018 12:46 PM Performed by: West Pugh, CRNA Pre-anesthesia Checklist: Patient identified, Emergency Drugs available, Suction available, Patient being monitored and Timeout performed Patient Re-evaluated:Patient Re-evaluated prior to induction Oxygen Delivery Method: Circle system utilized Preoxygenation: Pre-oxygenation with 100% oxygen Induction Type: IV induction and Cricoid Pressure applied Ventilation: Mask ventilation without difficulty and Oral airway inserted - appropriate to patient size Laryngoscope Size: Mac and 4 Grade View: Grade II Tube type: Oral Tube size: 7.5 mm Number of attempts: 1 Airway Equipment and Method: Stylet Placement Confirmation: ETT inserted through vocal cords under direct vision,  positive ETCO2,  CO2 detector and breath sounds checked- equal and bilateral Secured at: 22 cm Tube secured with: Tape Dental Injury: Teeth and Oropharynx as per pre-operative assessment  Comments: Patient has dry lips. Open area noted to upper lip, prior to intubation. Lubricant applied.

## 2018-10-04 NOTE — Progress Notes (Signed)
Assisted Dr. Germeroth with right, ultrasound guided, adductor canal block. Side rails up, monitors on throughout procedure. See vital signs in flow sheet. Tolerated Procedure well. 

## 2018-10-04 NOTE — Interval H&P Note (Signed)
History and Physical Interval Note:  10/04/2018 10:28 AM  Jacob Bishop  has presented today for surgery, with the diagnosis of right knee osteoarthritis  The various methods of treatment have been discussed with the patient and family. After consideration of risks, benefits and other options for treatment, the patient has consented to  Procedure(s) with comments: RIGHT TOTAL KNEE ARTHROPLASTY (Right) - 87min as a surgical intervention .  The patient's history has been reviewed, patient examined, no change in status, stable for surgery.  I have reviewed the patient's chart and labs.  Questions were answered to the patient's satisfaction.     Pilar Plate Hagar Sadiq

## 2018-10-04 NOTE — Discharge Instructions (Signed)
° °Dr. Frank Aluisio °Total Joint Specialist °[facility] °[address] °[place], [place] [address] °[phone] ° °TOTAL KNEE REPLACEMENT POSTOPERATIVE DIRECTIONS ° °Knee Rehabilitation, Guidelines Following Surgery  °Results after knee surgery are often greatly improved when you follow the exercise, range of motion and muscle strengthening exercises prescribed by your doctor. Safety measures are also important to protect the knee from further injury. Any time any of these exercises cause you to have increased pain or swelling in your knee joint, decrease the amount until you are comfortable again and slowly increase them. If you have problems or questions, call your caregiver or physical therapist for advice.  ° °HOME CARE INSTRUCTIONS  °• Remove items at home which could result in a fall. This includes throw rugs or furniture in walking pathways.  °· ICE to the affected knee every three hours for 30 minutes at a time and then as needed for pain and swelling.  Continue to use ice on the knee for pain and swelling from surgery. You may notice swelling that will progress down to the foot and ankle.  This is normal after surgery.  Elevate the leg when you are not up walking on it.   °· Continue to use the breathing machine which will help keep your temperature down.  It is common for your temperature to cycle up and down following surgery, especially at night when you are not up moving around and exerting yourself.  The breathing machine keeps your lungs expanded and your temperature down. °· Do not place pillow under knee, focus on keeping the knee straight while resting ° °DIET °You may resume your previous home diet once your are discharged from the hospital. ° °DRESSING / WOUND CARE / SHOWERING °You may shower 3 days after surgery, but keep the wounds dry during showering.  You may use an occlusive plastic wrap (Press'n Seal for example), NO SOAKING/SUBMERGING IN THE BATHTUB.  If the bandage  gets wet, change with a clean dry gauze.  If the incision gets wet, pat the wound dry with a clean towel. °You may start showering once you are discharged home but do not submerge the incision under water. Just pat the incision dry and apply a dry gauze dressing on daily. °Change the surgical dressing daily and reapply a dry dressing each time. ° °ACTIVITY °Walk with your walker as instructed. °Use walker as long as suggested by your caregivers. °Avoid periods of inactivity such as sitting longer than an hour when not asleep. This helps prevent blood clots.  °You may resume a sexual relationship in one month or when given the OK by your doctor.  °You may return to work once you are cleared by your doctor.  °Do not drive a car for 6 weeks or until released by you surgeon.  °Do not drive while taking narcotics. ° °WEIGHT BEARING °Weight bearing as tolerated with assist device (walker, cane, etc) as directed, use it as long as suggested by your surgeon or therapist, typically at least 4-6 weeks. ° °POSTOPERATIVE CONSTIPATION PROTOCOL °Constipation - defined medically as fewer than three stools per week and severe constipation as less than one stool per week. ° °One of the most common issues patients have following surgery is constipation.  Even if you have a regular bowel pattern at home, your normal regimen is likely to be disrupted due to multiple reasons following surgery.  Combination of anesthesia, postoperative narcotics, change in appetite and fluid intake all can affect your bowels.    In order to avoid complications following surgery, here are some recommendations in order to help you during your recovery period. ° °Colace (docusate) - Pick up an over-the-counter form of Colace or another stool softener and take twice a day as long as you are requiring postoperative pain medications.  Take with a full glass of water daily.  If you experience loose stools or diarrhea, hold the colace until you stool forms back  up.  If your symptoms do not get better within 1 week or if they get worse, check with your doctor. ° °Dulcolax (bisacodyl) - Pick up over-the-counter and take as directed by the product packaging as needed to assist with the movement of your bowels.  Take with a full glass of water.  Use this product as needed if not relieved by Colace only.  ° °MiraLax (polyethylene glycol) - Pick up over-the-counter to have on hand.  MiraLax is a solution that will increase the amount of water in your bowels to assist with bowel movements.  Take as directed and can mix with a glass of water, juice, soda, coffee, or tea.  Take if you go more than two days without a movement. °Do not use MiraLax more than once per day. Call your doctor if you are still constipated or irregular after using this medication for 7 days in a row. ° °If you continue to have problems with postoperative constipation, please contact the office for further assistance and recommendations.  If you experience "the worst abdominal pain ever" or develop nausea or vomiting, please contact the office immediatly for further recommendations for treatment. ° °ITCHING ° If you experience itching with your medications, try taking only a single pain pill, or even half a pain pill at a time.  You can also use Benadryl over the counter for itching or also to help with sleep.  ° °TED HOSE STOCKINGS °Wear the elastic stockings on both legs for three weeks following surgery during the day but you may remove then at night for sleeping. ° °MEDICATIONS °See your medication summary on the “After Visit Summary” that the nursing staff will review with you prior to discharge.  You may have some home medications which will be placed on hold until you complete the course of blood thinner medication.  It is important for you to complete the blood thinner medication as prescribed by your surgeon.  Continue your approved medications as instructed at time of discharge. ° °PRECAUTIONS °If  you experience chest pain or shortness of breath - call 911 immediately for transfer to the hospital emergency department.  °If you develop a fever greater that 101 F, purulent drainage from wound, increased redness or drainage from wound, foul odor from the wound/dressing, or calf pain - CONTACT YOUR SURGEON.   °                                                °FOLLOW-UP APPOINTMENTS °Make sure you keep all of your appointments after your operation with your surgeon and caregivers. You should call the office at the above phone number and make an appointment for approximately two weeks after the date of your surgery or on the date instructed by your surgeon outlined in the "After Visit Summary". ° ° °RANGE OF MOTION AND STRENGTHENING EXERCISES  °Rehabilitation of the knee is important following a knee injury or   an operation. After just a few days of immobilization, the muscles of the thigh which control the knee become weakened and shrink (atrophy). Knee exercises are designed to build up the tone and strength of the thigh muscles and to improve knee motion. Often times heat used for twenty to thirty minutes before working out will loosen up your tissues and help with improving the range of motion but do not use heat for the first two weeks following surgery. These exercises can be done on a training (exercise) mat, on the floor, on a table or on a bed. Use what ever works the best and is most comfortable for you Knee exercises include:  °• Leg Lifts - While your knee is still immobilized in a splint or cast, you can do straight leg raises. Lift the leg to 60 degrees, hold for 3 sec, and slowly lower the leg. Repeat 10-20 times 2-3 times daily. Perform this exercise against resistance later as your knee gets better.  °• Quad and Hamstring Sets - Tighten up the muscle on the front of the thigh (Quad) and hold for 5-10 sec. Repeat this 10-20 times hourly. Hamstring sets are done by pushing the foot backward against an  object and holding for 5-10 sec. Repeat as with quad sets.  °· Leg Slides: Lying on your back, slowly slide your foot toward your buttocks, bending your knee up off the floor (only go as far as is comfortable). Then slowly slide your foot back down until your leg is flat on the floor again. °· Angel Wings: Lying on your back spread your legs to the side as far apart as you can without causing discomfort.  °A rehabilitation program following serious knee injuries can speed recovery and prevent re-injury in the future due to weakened muscles. Contact your doctor or a physical therapist for more information on knee rehabilitation.  ° °IF YOU ARE TRANSFERRED TO A SKILLED REHAB FACILITY °If the patient is transferred to a skilled rehab facility following release from the hospital, a list of the current medications will be sent to the facility for the patient to continue.  When discharged from the skilled rehab facility, please have the facility set up the patient's Home Health Physical Therapy prior to being released. Also, the skilled facility will be responsible for providing the patient with their medications at time of release from the facility to include their pain medication, the muscle relaxants, and their blood thinner medication. If the patient is still at the rehab facility at time of the two week follow up appointment, the skilled rehab facility will also need to assist the patient in arranging follow up appointment in our office and any transportation needs. ° °MAKE SURE YOU:  °• Understand these instructions.  °• Get help right away if you are not doing well or get worse.  ° ° °Pick up stool softner and laxative for home use following surgery while on pain medications. °Do not submerge incision under water. °Please use good hand washing techniques while changing dressing each day. °May shower starting three days after surgery. °Please use a clean towel to pat the incision dry following showers. °Continue to  use ice for pain and swelling after surgery. °Do not use any lotions or creams on the incision until instructed by your surgeon. ° °

## 2018-10-04 NOTE — Evaluation (Signed)
Physical Therapy Evaluation Patient Details Name: Jacob Bishop MRN: [id number] DOB: 1940-10-05 Today's Date: 10/04/2018   History of Present Illness  78 yo male s/p R TKR on 10/04/18. PMH includes OA, carpal tunnel, BPPV, CAD with coronary stent placement 1997, OSA, DMII, HTN, HLD, obesity, CHF.   Clinical Impression   Pt presents with R knee pain, decreased R knee ROM, LE weakness, difficulty performing mobility tasks, and nause/vomiting post-ambulation. Pt to benefit from acute PT to address deficits. Pt ambulated 15 ft with RW with min guard assist, verbal cuing provided for safety and form throughout. After ambulation, pt with emesis, RN aware. Pt educated on ankle pumps (20/hour) to perform this afternoon/evening to increase circulation, to pt's tolerance and limited by pain. PT to progress mobility as tolerated, and will continue to follow acutely.        Follow Up Recommendations Follow surgeon's recommendation for DC plan and follow-up therapies;Supervision for mobility/OOB(OPPT)    Equipment Recommendations  Rolling walker with 5" wheels(ordered )    Recommendations for Other Services       Precautions / Restrictions Precautions Precautions: Fall Required Braces or Orthoses: Knee Immobilizer - Right Knee Immobilizer - Right: On when out of bed or walking;Discontinue once straight leg raise with < 10 degree lag Restrictions Weight Bearing Restrictions: No Other Position/Activity Restrictions: WBAT       Mobility  Bed Mobility Overal bed mobility: Needs Assistance Bed Mobility: Supine to Sit;Sit to Supine     Supine to sit: Min assist;HOB elevated Sit to supine: HOB elevated;Mod assist   General bed mobility comments: Min assist supine to sit for RLE lifting and translation to EOB, pt with use of bed rails for trunk elevation. Mod assist for sit to supine for bilateral LE lifting, scooting pt up in bed with assist via bedpad.   Transfers Overall transfer level: Needs  assistance Equipment used: Rolling walker (2 wheeled) Transfers: Sit to/from Stand Sit to Stand: Min assist;From elevated surface;+2 safety/equipment         General transfer comment: Min assist for power up, steadying upon standing. Verbal cuing for hand placement when rising.   Ambulation/Gait Ambulation/Gait assistance: Min guard;+2 safety/equipment Gait Distance (Feet): 15 Feet Assistive device: Rolling walker (2 wheeled) Gait Pattern/deviations: Step-to pattern;Decreased stance time - right;Decreased weight shift to right;Antalgic;Trunk flexed Gait velocity: decr    General Gait Details: Min guard for safety. Verbal cuing for sequencing, placement in RW.   Stairs            Wheelchair Mobility    Modified Rankin (Stroke Patients Only)       Balance Overall balance assessment: Needs assistance Sitting-balance support: No upper extremity supported Sitting balance-Leahy Scale: Good     Standing balance support: Bilateral upper extremity supported Standing balance-Leahy Scale: Poor                               Pertinent Vitals/Pain Pain Assessment: 0-10 Pain Score: 3  Pain Location: R knee  Pain Descriptors / Indicators: Sore;Discomfort Pain Intervention(s): Limited activity within patient's tolerance;Repositioned;Ice applied;Premedicated before session;Monitored during session    [place] expects to be discharged to:: Private residence Living Arrangements: Spouse/significant other Available Help at Discharge: Family;Available 24 hours/day Type of Home: House Home Access: Stairs to enter   [employer] of Steps: 3 Home Layout: Two level Home Equipment: Walker - standard;Cane - single point      Prior Function Level  of Independence: Independent with assistive device(s)               Hand Dominance   Dominant Hand: Right    Extremity/Trunk Assessment   Upper Extremity Assessment Upper Extremity  Assessment: Overall WFL for tasks assessed    Lower Extremity Assessment Lower Extremity Assessment: Generalized weakness;RLE deficits/detail RLE Deficits / Details: suspected post-surgical weakness; able to perform ankle pumps, quad set, heel slide to 60*, SLR with knee immobilizer donned  RLE Sensation: WNL    Cervical / Trunk Assessment Cervical / Trunk Assessment: Normal  Communication   Communication: No difficulties  Cognition Arousal/Alertness: Awake/alert Behavior During Therapy: WFL for tasks assessed/performed Overall Cognitive Status: Within Functional Limits for tasks assessed                                        General Comments General comments (skin integrity, edema, etc.): Pt with emesis after ambulation, once returned to bed. RN made aware.     Exercises Total Joint Exercises Goniometric ROM: knee aarom ~10-60*, limited by pain .   Assessment/Plan    PT Assessment Patient needs continued PT services  PT Problem List Decreased strength;Pain;Decreased range of motion;Decreased activity tolerance;Decreased knowledge of use of DME;Decreased balance;Decreased mobility;Decreased safety awareness       PT Treatment Interventions DME instruction;Therapeutic activities;Therapeutic exercise;Gait training;Patient/family education;Balance training;Stair training;Functional mobility training    PT Goals (Current goals can be found in the Care Plan section)  Acute Rehab PT Goals Patient Stated Goal: none stated  PT Goal Formulation: With patient Time For Goal Achievement: 10/11/18 Potential to Achieve Goals: Good    Frequency 7X/week   Barriers to discharge        Co-evaluation               AM-PAC PT "6 Clicks" Mobility  Outcome Measure Help needed turning from your back to your side while in a flat bed without using bedrails?: A Little Help needed moving from lying on your back to sitting on the side of a flat bed without using  bedrails?: A Little Help needed moving to and from a bed to a chair (including a wheelchair)?: A Little Help needed standing up from a chair using your arms (e.g., wheelchair or bedside chair)?: A Little Help needed to walk in hospital room?: A Little Help needed climbing 3-5 steps with a railing? : A Little 6 Click Score: 18    End of Session Equipment Utilized During Treatment: Gait belt;Right knee immobilizer Activity Tolerance: Patient limited by pain;Treatment limited secondary to medical complications (Comment)(pt limited by nausea/emesis) Patient left: in bed;with bed alarm set;with call bell/phone within reach;with family/visitor present;with nursing/sitter in room(Pt on scheduled SCD break, to be reapplied by NT later) Nurse Communication: Mobility status PT Visit Diagnosis: Other abnormalities of gait and mobility (R26.89);Difficulty in walking, not elsewhere classified (R26.2);Muscle weakness (generalized) (M62.81)    Time: 3546-5681 PT Time Calculation (min) (ACUTE ONLY): 21 min   Charges:   PT Evaluation $PT Eval Low Complexity: 1 Low         Molley Houser Conception Chancy, PT Acute Rehabilitation Services Pager [phone]  Office [phone]  Roxine Caddy D Elonda Husky 10/04/2018, 6:55 PM

## 2018-10-04 NOTE — Op Note (Signed)
OPERATIVE REPORT-TOTAL KNEE ARTHROPLASTY   Pre-operative diagnosis- Osteoarthritis  Right knee(s)  Post-operative diagnosis- Osteoarthritis Right knee(s)  Procedure-  Right  Total Knee Arthroplasty  Surgeon- Dione Plover. Anice Wilshire, MD  Assistant- Ardeen Jourdain, PA-C   Anesthesia-  GA combined with regional for post-op pain  EBL-\ 25 ml   Drains Hemovac  Tourniquet time-  Total Tourniquet Time Documented: Thigh (Right) - 36 minutes Total: Thigh (Right) - 36 minutes     Complications- None  Condition-PACU - hemodynamically stable.   Brief Clinical Note  Jacob Bishop is a 78 y.o. year old male with end stage OA of his right knee with progressively worsening pain and dysfunction. He has constant pain, with activity and at rest and significant functional deficits with difficulties even with ADLs. He has had extensive non-op management including analgesics, injections of cortisone, and home exercise program, but remains in significant pain with significant dysfunction. Radiographs show bone on bone arthritis medial and patellofemoral. He presents now for right Total Knee Arthroplasty.    Procedure in detail---   The patient is brought into the operating room and positioned supine on the operating table. After successful administration of  GA combined with regional for post-op pain,   a tourniquet is placed high on the  Right thigh(s) and the lower extremity is prepped and draped in the usual sterile fashion. Time out is performed by the operating team and then the  Right lower extremity is wrapped in Esmarch, knee flexed and the tourniquet inflated to 300 mmHg.       A midline incision is made with a ten blade through the subcutaneous tissue to the level of the extensor mechanism. A fresh blade is used to make a medial parapatellar arthrotomy. Soft tissue over the proximal medial tibia is subperiosteally elevated to the joint line with a knife and into the semimembranosus bursa with a  Cobb elevator. Soft tissue over the proximal lateral tibia is elevated with attention being paid to avoiding the patellar tendon on the tibial tubercle. The patella is everted, knee flexed 90 degrees and the ACL and PCL are removed. Findings are bone on bone medial and patellofemoral with large global osteophytes.        The drill is used to create a starting hole in the distal femur and the canal is thoroughly irrigated with sterile saline to remove the fatty contents. The 5 degree Right  valgus alignment guide is placed into the femoral canal and the distal femoral cutting block is pinned to remove 10 mm off the distal femur. Resection is made with an oscillating saw.      The tibia is subluxed forward and the menisci are removed. The extramedullary alignment guide is placed referencing proximally at the medial aspect of the tibial tubercle and distally along the second metatarsal axis and tibial crest. The block is pinned to remove 8mm off the more deficient medial  side. Resection is made with an oscillating saw. Size 4is the most appropriate size for the tibia and the proximal tibia is prepared with the modular drill and keel punch for that size.      The femoral sizing guide is placed and size 5 is most appropriate. Rotation is marked off the epicondylar axis and confirmed by creating a rectangular flexion gap at 90 degrees. The size 5 cutting block is pinned in this rotation and the anterior, posterior and chamfer cuts are made with the oscillating saw. The intercondylar block is then placed and that cut  is made.      Trial size 4 tibial component, trial size 5 posterior stabilized femur and a 12.5  mm posterior stabilized rotating platform insert trial is placed. Full extension is achieved with excellent varus/valgus and anterior/posterior balance throughout full range of motion. The patella is everted and thickness measured to be 25  mm. Free hand resection is taken to 15 mm, a 38 template is placed,  lug holes are drilled, trial patella is placed, and it tracks normally. Osteophytes are removed off the posterior femur with the trial in place. All trials are removed and the cut bone surfaces prepared with pulsatile lavage. Cement is mixed and once ready for implantation, the size 4 tibial implant, size  5 posterior stabilized femoral component, and the size 38 patella are cemented in place and the patella is held with the clamp. The trial insert is placed and the knee held in full extension. The Exparel (20 ml mixed with 60 ml saline) is injected into the extensor mechanism, posterior capsule, medial and lateral gutters and subcutaneous tissues.  All extruded cement is removed and once the cement is hard the permanent 12.5 mm posterior stabilized rotating platform insert is placed into the tibial tray.      The wound is copiously irrigated with saline solution and the extensor mechanism closed over a hemovac drain with #1 V-loc suture. The tourniquet is released for a total tourniquet time of 36  minutes. Flexion against gravity is 135 degrees and the patella tracks normally. Subcutaneous tissue is closed with 2.0 vicryl and subcuticular with running 4.0 Monocryl. The incision is cleaned and dried and steri-strips and a bulky sterile dressing are applied. The limb is placed into a knee immobilizer and the patient is awakened and transported to recovery in stable condition.      Please note that a surgical assistant was a medical necessity for this procedure in order to perform it in a safe and expeditious manner. Surgical assistant was necessary to retract the ligaments and vital neurovascular structures to prevent injury to them and also necessary for proper positioning of the limb to allow for anatomic placement of the prosthesis.   Dione Plover Jacob Cowens, MD    10/04/2018, 1:42 PM

## 2018-10-04 NOTE — Anesthesia Postprocedure Evaluation (Signed)
Anesthesia Post Note  Patient: Jacob Bishop  Procedure(s) Performed: RIGHT TOTAL KNEE ARTHROPLASTY (Right Knee)     Patient location during evaluation: PACU Anesthesia Type: General Level of consciousness: sedated and patient cooperative Pain management: pain level controlled Vital Signs Assessment: post-procedure vital signs reviewed and stable Respiratory status: spontaneous breathing Cardiovascular status: stable Anesthetic complications: no    Last Vitals:  Vitals:   10/04/18 1806 10/04/18 1848  BP: (!) 148/73 139/80  Pulse: 93 92  Resp: 14   Temp:  36.5 C  SpO2: 93% 100%    Last Pain:  Vitals:   10/04/18 1925  TempSrc:   PainSc: Olivia

## 2018-10-04 NOTE — Care Plan (Signed)
Ortho Bundle Case Management Note  Patient Details  Name: Jacob Bishop MRN: [id number] Date of Birth: 1941-04-24  R TKA on 10-04-2018 DCP:  Home with spouse.  2 story home with 3 ste. DME:  RW ordered through [facility].  Has a 3-in-1. PT:  [facility].  PT eval scheduled on 10-08-2018.                   DME Arranged:  Gilford Rile rolling DME Agency:  [employer]  HH Arranged:  NA [place] Agency:  NA  Additional Comments: Please contact me with any questions of if this plan should need to change.  Marianne Sofia, RN,CCM [facility]  [phone] 10/04/2018, 3:03 PM

## 2018-10-04 NOTE — Anesthesia Procedure Notes (Addendum)
Anesthesia Regional Block: Adductor canal block   Pre-Anesthetic Checklist: ,, timeout performed, Correct Patient, Correct Site, Correct Laterality, Correct Procedure, Correct Position, site marked, Risks and benefits discussed,  Surgical consent,  Pre-op evaluation,  At surgeon's request and post-op pain management  Laterality: Right  Prep: chloraprep       Needles:  Injection technique: Single-shot  Needle Type: Stimiplex     Needle Length: 9cm  Needle Gauge: 21     Additional Needles:   Procedures:,,,, ultrasound used (permanent image in chart),,,,  Narrative:  Start time: 10/04/2018 12:02 PM End time: 10/04/2018 12:04 PM Injection made incrementally with aspirations every 5 mL.  Performed by: Personally  Anesthesiologist: Nolon Nations, MD  Additional Notes: BP cuff, EKG monitors applied. Sedation begun. Artery and nerve location verified with U/S and anesthetic injected incrementally, slowly, and after negative aspirations under direct u/s guidance. Good fascial /perineural spread. Tolerated well.

## 2018-10-04 NOTE — Plan of Care (Signed)

## 2018-10-04 NOTE — Transfer of Care (Signed)
Immediate Anesthesia Transfer of Care Note  Patient: Jacob Bishop  Procedure(s) Performed: RIGHT TOTAL KNEE ARTHROPLASTY (Right Knee)  Patient Location: PACU  Anesthesia Type:GA combined with regional for post-op pain  Level of Consciousness: awake, alert , oriented and patient cooperative  Airway & Oxygen Therapy: Patient Spontanous Breathing and Patient connected to face mask oxygen  Post-op Assessment: Report given to RN and Post -op Vital signs reviewed and stable  Post vital signs: Reviewed and stable  Last Vitals:  Vitals Value Taken Time  BP 153/77 10/04/2018  2:03 PM  Temp    Pulse 87 10/04/2018  2:06 PM  Resp 21 10/04/2018  2:06 PM  SpO2 98 % 10/04/2018  2:06 PM  Vitals shown include unvalidated device data.  Last Pain:  Vitals:   10/04/18 1215  TempSrc:   PainSc: 0-No pain      Patients Stated Pain Goal: 4 (48/54/62 7035)  Complications: No apparent anesthesia complications

## 2018-10-05 ENCOUNTER — Encounter (HOSPITAL_COMMUNITY): Payer: Self-pay | Admitting: Orthopedic Surgery

## 2018-10-05 LAB — BASIC METABOLIC PANEL
Anion gap: 9 (ref 5–15)
BUN: 16 mg/dL (ref 8–23)
CO2: 26 mmol/L (ref 22–32)
Calcium: 8.3 mg/dL — ABNORMAL LOW (ref 8.9–10.3)
Chloride: 102 mmol/L (ref 98–111)
Creatinine, Ser: 0.78 mg/dL (ref 0.61–1.24)
GFR calc Af Amer: >60 mL/min (ref >60)
GFR calc non Af Amer: >60 mL/min (ref >60)
Glucose, Bld: 171 mg/dL — ABNORMAL HIGH (ref 70–99)
POTASSIUM: 4.5 mmol/L (ref 3.5–5.1)
Sodium: 137 mmol/L (ref 135–145)

## 2018-10-05 LAB — CBC
HCT: 36.9 % — ABNORMAL LOW (ref 39.0–52.0)
Hemoglobin: 12.2 g/dL — ABNORMAL LOW (ref 13.0–17.0)
MCH: 32.2 pg (ref 26.0–34.0)
MCHC: 33.1 g/dL (ref 30.0–36.0)
MCV: 97.4 fL (ref 80.0–100.0)
Platelets: 92 10*3/uL — ABNORMAL LOW (ref 150–400)
RBC: 3.79 MIL/uL — AB (ref 4.22–5.81)
RDW: 13.2 % (ref 11.5–15.5)
WBC: 7.7 10*3/uL (ref 4.0–10.5)
nRBC: 0 % (ref 0.0–0.2)

## 2018-10-05 LAB — GLUCOSE, CAPILLARY
GLUCOSE-CAPILLARY: 139 mg/dL — AB (ref 70–99)
Glucose-Capillary: 141 mg/dL — ABNORMAL HIGH (ref 70–99)
Glucose-Capillary: 153 mg/dL — ABNORMAL HIGH (ref 70–99)
Glucose-Capillary: 192 mg/dL — ABNORMAL HIGH (ref 70–99)

## 2018-10-05 MED ORDER — METHOCARBAMOL 500 MG PO TABS: 500.0000 mg | ORAL_TABLET | Freq: Four times a day (QID) | ORAL | 0 refills | Status: DC | PRN

## 2018-10-05 MED ORDER — GABAPENTIN 300 MG PO CAPS: 300.0000 mg | ORAL_CAPSULE | Freq: Three times a day (TID) | ORAL | 0 refills | Status: DC

## 2018-10-05 MED ORDER — TRAMADOL HCL 50 MG PO TABS: 50.0000 mg | ORAL_TABLET | Freq: Four times a day (QID) | ORAL | 0 refills | Status: DC | PRN

## 2018-10-05 MED ORDER — ASPIRIN 325 MG PO TBEC: 325.0000 mg | DELAYED_RELEASE_TABLET | Freq: Two times a day (BID) | ORAL | 0 refills | Status: DC

## 2018-10-05 MED ORDER — OXYCODONE HCL 5 MG PO TABS: 5.0000 mg | ORAL_TABLET | Freq: Four times a day (QID) | ORAL | 0 refills | Status: DC | PRN

## 2018-10-05 NOTE — Progress Notes (Signed)
Physical Therapy Treatment Patient Details Name: Jacob Bishop MRN: [id number] DOB: 01/02/1941 Today's Date: 10/05/2018    History of Present Illness 78 yo male s/p R TKR on 10/04/18. PMH includes OA, carpal tunnel, BPPV, CAD with coronary stent placement 1997, OSA, DMII, HTN, HLD, obesity, CHF.     PT Comments    POD # 1 am session Applied KI and instructed on use and proper application.  Assisted OOB to amb a greater distance then returned to room to perform some TE's followed by ICE.   Follow Up Recommendations  Follow surgeon's recommendation for DC plan and follow-up therapies;Supervision for mobility/OOB     Equipment Recommendations  Rolling walker with 5" wheels    Recommendations for Other Services       Precautions / Restrictions Precautions Precautions: Fall Precaution Comments: instructed on KI use and proper application  Required Braces or Orthoses: Knee Immobilizer - Right Knee Immobilizer - Right: On when out of bed or walking;Discontinue once straight leg raise with < 10 degree lag Restrictions Weight Bearing Restrictions: No Other Position/Activity Restrictions: WBAT     Mobility  Bed Mobility Overal bed mobility: Needs Assistance Bed Mobility: Supine to Sit     Supine to sit: Min assist;HOB elevated     General bed mobility comments: assist LE off bed with increased time  Transfers Overall transfer level: Needs assistance Equipment used: Rolling walker (2 wheeled) Transfers: Sit to/from [facility] Sit to Stand: Min assist         General transfer comment: 50% VC's on proper hand placment and VC's to advance R LE prior to sit  Ambulation/Gait Ambulation/Gait assistance: Min guard;Min assist Gait Distance (Feet): 35 Feet Assistive device: Rolling walker (2 wheeled) Gait Pattern/deviations: Step-to pattern;Decreased stance time - right;Decreased weight shift to right;Antalgic;Trunk flexed Gait velocity: decreased    General  Gait Details: 50% VC's for proper walker to self distance, upright posture and proper sequencing.     Stairs             Wheelchair Mobility    Modified Rankin (Stroke Patients Only)       Balance                                            Cognition Arousal/Alertness: Awake/alert Behavior During Therapy: WFL for tasks assessed/performed Overall Cognitive Status: Within Functional Limits for tasks assessed                                        Exercises  10 reps AP, knee presses and towel squeezes     General Comments        Pertinent Vitals/Pain Pain Assessment: 0-10 Pain Score: 7  Pain Location: R knee  Pain Descriptors / Indicators: Sore;Discomfort Pain Intervention(s): Monitored during session;Repositioned;Ice applied    Home Living                      Prior Function            PT Goals (current goals can now be found in the care plan section) Progress towards PT goals: Progressing toward goals    Frequency    7X/week      PT Plan      Co-evaluation  AM-PAC PT "6 Clicks" Mobility   Outcome Measure  Help needed turning from your back to your side while in a flat bed without using bedrails?: A Little Help needed moving from lying on your back to sitting on the side of a flat bed without using bedrails?: A Little Help needed moving to and from a bed to a chair (including a wheelchair)?: A Little Help needed standing up from a chair using your arms (e.g., wheelchair or bedside chair)?: A Little Help needed to walk in hospital room?: A Little Help needed climbing 3-5 steps with a railing? : A Little 6 Click Score: 18    End of Session Equipment Utilized During Treatment: Gait belt;Right knee immobilizer Activity Tolerance: Patient limited by pain;Treatment limited secondary to medical complications (Comment) Patient left: in bed;with bed alarm set;with call bell/phone within  reach;with family/visitor present;with nursing/sitter in room Nurse Communication: Mobility status PT Visit Diagnosis: Other abnormalities of gait and mobility (R26.89);Difficulty in walking, not elsewhere classified (R26.2);Muscle weakness (generalized) (M62.81)     Time: 6438-3818 PT Time Calculation (min) (ACUTE ONLY): 30 min  Charges:  $Gait Training: 8-22 mins $Therapeutic Exercise: 8-22 mins                     Rica Koyanagi  PTA Acute  Rehabilitation Services Pager      [phone] Office      [phone]

## 2018-10-05 NOTE — Care Management Note (Signed)
Case Management Note  Patient Details  Name: Jacob Bishop MRN: [id number] Date of Birth: March 01, 1941  Subjective/Objective:                  dischRGED  Action/Plan: Home with dme and pt-outpt  Expected Discharge Date:  10/05/18               Expected Discharge Plan:  [name]  In-House Referral:  NA  Discharge planning Services  CM Consult  Post Acute Care Choice:  Durable Medical Equipment Choice offered to:  Patient  DME Arranged:  Walker rolling DME Agency:  [employer]  HH Arranged:  NA HH Agency:  NA  Status of Service:  Completed, signed off  If discussed at [place] of Stay Meetings, dates discussed:    Additional Comments:  [name], RN 10/05/2018, 10:25 AM

## 2018-10-05 NOTE — Progress Notes (Signed)
RN called PA about patient oxygenation saturation staying between 89% and 93% on 2L.   PA confirmed about patient's pulmonary health issues.   Will continue to monitor, no interventions at this time.

## 2018-10-05 NOTE — Telephone Encounter (Signed)
Sent to the pharmacy by e-scribe. 

## 2018-10-05 NOTE — Progress Notes (Signed)
Subjective: 1 Day Post-Op Procedure(s) (LRB): RIGHT TOTAL KNEE ARTHROPLASTY (Right) Patient reports pain as mild.   Patient seen in rounds by Dr. Wynelle Link. Patient is well, and has had no acute complaints or problems. No issues overnight. Denies chest pain, SOB, or calf pain. Foley catheter removed this AM. We will continue therapy today.   Objective: Vital signs in last 24 hours: Temp:  [97.5 F (36.4 C)-98 F (36.7 C)] 98 F (36.7 C) (02/11 0510) Pulse Rate:  [67-95] 81 (02/11 0510) Resp:  [9-25] 17 (02/11 0510) BP: (102-153)/(63-84) 120/63 (02/11 0510) SpO2:  [92 %-100 %] 98 % (02/11 0510) Weight:  [129.3 kg] 129.3 kg (02/10 1539)  Intake/Output from previous day:  Intake/Output Summary (Last 24 hours) at 10/05/2018 0708 Last data filed at 10/05/2018 0600 Gross per 24 hour  Intake 3708.14 ml  Output 1605 ml  Net 2103.14 ml     Labs: Recent Labs    10/04/18 1611 10/05/18 0343  HGB 13.5 12.2*   Recent Labs    10/04/18 1611 10/05/18 0343  WBC 6.3 7.7  RBC 4.17* 3.79*  HCT 40.4 36.9*  PLT 85* 92*   Recent Labs    10/05/18 0343  NA 137  K 4.5  CL 102  CO2 26  BUN 16  CREATININE 0.78  GLUCOSE 171*  CALCIUM 8.3*   Exam: General - Patient is Alert and Oriented Extremity - Neurologically intact Neurovascular intact Sensation intact distally Dorsiflexion/Plantar flexion intact Dressing - dressing C/D/I Motor Function - intact, moving foot and toes well on exam.   Past Medical History:  Diagnosis Date  . AI (aortic insufficiency) 01/11/2018   Trace, noted on ECHO  . Arthritis   . CAD in native artery    Nuclear stress test 10/19: EF 58, normal perfusion, low risk  . Cellulitis   . Dermatitis   . Diabetes mellitus    type II  . Diastolic dysfunction 50/27/7412   Mild, noted on ECHO  . Diverticulosis 01/31/2016   ASCENDING COLON AND CECUM, NOTED ON COLONOSCOPY  . DOE (dyspnea on exertion)   . Edema   . Fatty liver 09/18/2004   Noted on [place]  ABD  . History of kidney stones 05/23/2002   Noted on CT Abd  . Hx of colonic polyps 01/31/2016  . Hyperlipidemia   . Hypertension   . Internal hemorrhoids 01/31/2016   Noted on colonoscopy  . Obesity   . OSA (obstructive sleep apnea)    cpap  . Positional vertigo   . Pulmonary hypertension ([place]) 01/11/2018   Mild, noted on ECHO  . Rhinosinusitis   . Skin lesion   . TR (tricuspid regurgitation) 01/11/2018   Trace, noted on ECHO    Assessment/Plan: 1 Day Post-Op Procedure(s) (LRB): RIGHT TOTAL KNEE ARTHROPLASTY (Right) Principal Problem:   Osteoarthritis of right knee Active Problems:   OA (osteoarthritis) of knee  Estimated body mass index is 39.76 kg/m as calculated from the following:   Height as of this encounter: 5\' 11"  (1.803 m).   Weight as of this encounter: 129.3 kg. Advance diet Up with therapy D/C IV fluids  Anticipated LOS equal to or greater than 2 midnights due to - Age 76 and older with one or more of the following:  - Obesity  - Expected need for hospital services (PT, OT, Nursing) required for safe  discharge  - Anticipated need for postoperative skilled nursing care or inpatient rehab  - Active co-morbidities: Diabetes and Coronary Artery Disease OR   -  Unanticipated findings during/Post Surgery: None  - Patient is a high risk of re-admission due to: None    DVT Prophylaxis - Aspirin Weight bearing as tolerated. D/C O2 and pulse ox and try on room air. Hemovac pulled without difficulty, will continue therapy today.  Plan is to go Home after hospital stay. Possible discharge this afternoon if progresses with therapy and meeting his goals. Scheduled for outpatient PT at [facility]. Follow-up in the office in 2 weeks.   Theresa Duty, PA-C Orthopedic Surgery 10/05/2018, 7:08 AM

## 2018-10-06 ENCOUNTER — Other Ambulatory Visit: Payer: Self-pay

## 2018-10-06 ENCOUNTER — Inpatient Hospital Stay (HOSPITAL_COMMUNITY): Payer: Medicare Other

## 2018-10-06 LAB — BASIC METABOLIC PANEL
Anion gap: 8 (ref 5–15)
BUN: 21 mg/dL (ref 8–23)
CHLORIDE: 102 mmol/L (ref 98–111)
CO2: 27 mmol/L (ref 22–32)
Calcium: 8.3 mg/dL — ABNORMAL LOW (ref 8.9–10.3)
Creatinine, Ser: 0.89 mg/dL (ref 0.61–1.24)
GFR calc Af Amer: >60 mL/min (ref >60)
GFR calc non Af Amer: >60 mL/min (ref >60)
Glucose, Bld: 181 mg/dL — ABNORMAL HIGH (ref 70–99)
Potassium: 4 mmol/L (ref 3.5–5.1)
Sodium: 137 mmol/L (ref 135–145)

## 2018-10-06 LAB — CBC
HCT: 34.3 % — ABNORMAL LOW (ref 39.0–52.0)
Hemoglobin: 11.5 g/dL — ABNORMAL LOW (ref 13.0–17.0)
MCH: 31.9 pg (ref 26.0–34.0)
MCHC: 33.5 g/dL (ref 30.0–36.0)
MCV: 95.3 fL (ref 80.0–100.0)
NRBC: 0 % (ref 0.0–0.2)
Platelets: 73 10*3/uL — ABNORMAL LOW (ref 150–400)
RBC: 3.6 MIL/uL — ABNORMAL LOW (ref 4.22–5.81)
RDW: 13.2 % (ref 11.5–15.5)
WBC: 6.7 10*3/uL (ref 4.0–10.5)

## 2018-10-06 LAB — GLUCOSE, CAPILLARY
Glucose-Capillary: 167 mg/dL — ABNORMAL HIGH (ref 70–99)
Glucose-Capillary: 217 mg/dL — ABNORMAL HIGH (ref 70–99)
Glucose-Capillary: 172 mg/dL — ABNORMAL HIGH (ref 70–99)
Glucose-Capillary: 174 mg/dL — ABNORMAL HIGH (ref 70–99)

## 2018-10-06 NOTE — Progress Notes (Signed)
Patient unable to complete PT this morning due to shaky/dizzy feeling. Patient states that oxycodone makes him feel "nervous". I questioned if it has made him feel abnormal the other times that he has taken it this admission and he said it just makes him feel nervous and shaky but he hasn't tried to get up after taking it like he did this morning. PT in the room and took orthostatic vital signs as follows:  Sitting BP 116/65, HR 103, RA 91% Standing BP 113/63, HR 119, RA 90 % After amb 5 feet BP 132/69, HR 124, RA 89%  Physician assistant, Theresa Duty, notified. Discontinued oxycodone. PT will retry ambulating patient this afternoon. I encouraged patient to drink water and rest. Will continue to monitor.

## 2018-10-06 NOTE — Progress Notes (Signed)
Notified by Sarajane Marek, RN that patient was experiencing weakness while working with physical therapy. Oxygen saturation dropped to 83% on room air and heart rate was increasing to 140s with attempted ambulation and around 100 at rest. No history of arrhythmias. Chest XR obtained which showed no active cardiopulmonary disease. 12-lead EKG performed which showed sinus tachycardia.   Dr. Wynelle Link updated on patient status, may need spiral CT but will hold right now.   Updated vital signs show oxygen saturation between 90-95% with HR in the low 100s.  Will continue to monitor.

## 2018-10-06 NOTE — Progress Notes (Signed)
Subjective: 2 Days Post-Op Procedure(s) (LRB): RIGHT TOTAL KNEE ARTHROPLASTY (Right) Patient reports pain as mild.   Patient seen in rounds for Dr. Wynelle Link. Patient is well, and has had no acute complaints or problems. Was having issues voiding yesterday afternoon, was I&O cathed twice. Voiding on his own this AM, did have a small amount of blood in urine, most likely from trauma of multiple catheterizations. Denies chest pain or SOB. Positive flatus.  Plan is to go Home after hospital stay.  Objective: Vital signs in last 24 hours: Temp:  [97.7 F (36.5 C)-99.5 F (37.5 C)] 97.7 F (36.5 C) (02/12 0617) Pulse Rate:  [79-109] 102 (02/12 0617) Resp:  [16-18] 18 (02/12 0617) BP: (121-153)/(61-79) 153/75 (02/12 0617) SpO2:  [92 %-98 %] 92 % (02/12 0617)  Intake/Output from previous day:  Intake/Output Summary (Last 24 hours) at 10/06/2018 0827 Last data filed at 10/06/2018 0238 Gross per 24 hour  Intake 1070.91 ml  Output 2545 ml  Net -1474.09 ml    Labs: Recent Labs    10/04/18 1611 10/05/18 0343 10/06/18 0521  HGB 13.5 12.2* 11.5*   Recent Labs    10/05/18 0343 10/06/18 0521  WBC 7.7 6.7  RBC 3.79* 3.60*  HCT 36.9* 34.3*  PLT 92* 73*   Recent Labs    10/05/18 0343 10/06/18 0521  NA 137 137  K 4.5 4.0  CL 102 102  CO2 26 27  BUN 16 21  CREATININE 0.78 0.89  GLUCOSE 171* 181*  CALCIUM 8.3* 8.3*   Exam: General - Patient is Alert and Oriented Extremity - Neurologically intact Neurovascular intact Sensation intact distally Dorsiflexion/Plantar flexion intact Dressing/Incision - clean, dry, with minimal bloody drainage from hemovac port site Motor Function - intact, moving foot and toes well on exam.   Past Medical History:  Diagnosis Date  . AI (aortic insufficiency) 01/11/2018   Trace, noted on ECHO  . Arthritis   . CAD in native artery    Nuclear stress test 10/19: EF 58, normal perfusion, low risk  . Cellulitis   . Dermatitis   . Diabetes  mellitus    type II  . Diastolic dysfunction 74/07/8785   Mild, noted on ECHO  . Diverticulosis 01/31/2016   ASCENDING COLON AND CECUM, NOTED ON COLONOSCOPY  . DOE (dyspnea on exertion)   . Edema   . Fatty liver 09/18/2004   Noted on [place] ABD  . History of kidney stones 05/23/2002   Noted on CT Abd  . Hx of colonic polyps 01/31/2016  . Hyperlipidemia   . Hypertension   . Internal hemorrhoids 01/31/2016   Noted on colonoscopy  . Obesity   . OSA (obstructive sleep apnea)    cpap  . Positional vertigo   . Pulmonary hypertension ([place]) 01/11/2018   Mild, noted on ECHO  . Rhinosinusitis   . Skin lesion   . TR (tricuspid regurgitation) 01/11/2018   Trace, noted on ECHO    Assessment/Plan: 2 Days Post-Op Procedure(s) (LRB): RIGHT TOTAL KNEE ARTHROPLASTY (Right) Principal Problem:   Osteoarthritis of right knee Active Problems:   OA (osteoarthritis) of knee  Estimated body mass index is 39.76 kg/m as calculated from the following:   Height as of this encounter: 5\' 11"  (1.803 m).   Weight as of this encounter: 129.3 kg. Up with therapy D/C IV fluids  DVT Prophylaxis - Aspirin Weight-bearing as tolerated  Plan for discharge this afternoon as long as progresses with therapy. Scheduled for outpatient physical therapy at [facility]. Follow-up  in the office in 2 weeks.   Theresa Duty, PA-C Orthopedic Surgery 10/06/2018, 8:27 AM

## 2018-10-06 NOTE — Progress Notes (Signed)
Physical Therapy Treatment Patient Details Name: Jacob Bishop MRN: [id number] DOB: 27-May-1941 Today's Date: 10/06/2018    History of Present Illness 78 yo male s/p R TKR on 10/04/18. PMH includes OA, carpal tunnel, BPPV, CAD with coronary stent placement 1997, OSA, DMII, HTN, HLD, obesity, CHF.     PT Comments    POD # 1 pm session Pt progressing slowly.  Required + 2 assist to rise from recliner.  "I feel really weak and tired".  General transfer comment: required + 2 MAX Assist from lower level recliner with posterior lean and LOB Therapist recovered.  Shaky/unsteady with MAX c/o fatigue.  General Gait Details: pain well controlled 2/10, wearing KI for increased support.  Very limited amb distance due to MAX c/o fatigue, mild dyspne noted and HR increased from 102 at rest to 129.  Pt unable to tolerate amb in hallway.  Assisted back to bed. RA decreased to 83% and HR increased to 129.  Reapplied 2 lts nasal and assisted back to bed. General bed mobility comments: Max Assist to get back to bed with assist for upper body due to ABD girth and B LE  Pt has NOT met goals to D/C to home today   Follow Up Recommendations  Follow surgeon's recommendation for DC plan and follow-up therapies;Supervision for mobility/OOB     Equipment Recommendations  Rolling walker with 5" wheels    Recommendations for Other Services       Precautions / Restrictions Precautions Precautions: Fall Precaution Comments: instructed on KI use and proper application  Required Braces or Orthoses: Knee Immobilizer - Right Knee Immobilizer - Right: On when out of bed or walking;Discontinue once straight leg raise with < 10 degree lag Restrictions Weight Bearing Restrictions: No Other Position/Activity Restrictions: WBAT     Mobility  Bed Mobility Overal bed mobility: Needs Assistance Bed Mobility: Sit to Supine     Supine to sit: Max assist Sit to supine: Max assist   General bed mobility comments: Max  Assist to get back to bed with assist for upper body due to ABD girth and B LE  Transfers Overall transfer level: Needs assistance Equipment used: Rolling walker (2 wheeled) Transfers: Sit to/from [facility] Sit to Stand: Max assist;+2 safety/equipment;From elevated surface Stand pivot transfers: Mod assist       General transfer comment: required + 2 MAX Assist from lower level recliner with posterior lean and LOB Therapist recovered.  Shaky/unsteady with MAX c/o fatigue.    Ambulation/Gait Ambulation/Gait assistance: Mod assist;+2 physical assistance;+2 safety/equipment Gait Distance (Feet): 7 Feet Assistive device: Rolling walker (2 wheeled) Gait Pattern/deviations: Step-to pattern;Decreased stance time - right;Decreased weight shift to right;Antalgic;Trunk flexed Gait velocity: decreased    General Gait Details: pain well controlled 2/10, wearing KI for increased support.  Very limited amb distance due to MAX c/o fatigue, mild dyspne noted and HR increased from 102 at rest to 129.  Pt unable to tolerate amb in hallway.  Assisted back to bed.    Stairs Stairs: (unable to attempt)           Wheelchair Mobility    Modified Rankin (Stroke Patients Only)       Balance                                            Cognition Arousal/Alertness: Awake/alert Behavior During Therapy: WFL for tasks  assessed/performed Overall Cognitive Status: Within Functional Limits for tasks assessed                                 General Comments: increased c/o dizziness, feeling 'woozy" when standing       Exercises      General Comments        Pertinent Vitals/Pain Pain Assessment: 0-10 Pain Score: 8  Pain Location: R knee  Pain Descriptors / Indicators: Grimacing;Discomfort;Operative site guarding;Tender;Throbbing Pain Intervention(s): Monitored during session;Repositioned;Premedicated before session;Ice applied    Home  Living                      Prior Function            PT Goals (current goals can now be found in the care plan section) Progress towards PT goals: Progressing toward goals    Frequency    7X/week      PT Plan Current plan remains appropriate    Co-evaluation              AM-PAC PT "6 Clicks" Mobility   Outcome Measure  Help needed turning from your back to your side while in a flat bed without using bedrails?: A Lot Help needed moving from lying on your back to sitting on the side of a flat bed without using bedrails?: A Lot Help needed moving to and from a bed to a chair (including a wheelchair)?: A Lot Help needed standing up from a chair using your arms (e.g., wheelchair or bedside chair)?: A Lot Help needed to walk in hospital room?: A Lot Help needed climbing 3-5 steps with a railing? : Total 6 Click Score: 9    End of Session Equipment Utilized During Treatment: Gait belt;Right knee immobilizer Activity Tolerance: Patient limited by pain;Treatment limited secondary to medical complications (Comment) Patient left: with family/visitor present;with call bell/phone within reach;in bed Nurse Communication: (RN in room during session) PT Visit Diagnosis: Other abnormalities of gait and mobility (R26.89);Difficulty in walking, not elsewhere classified (R26.2);Muscle weakness (generalized) (M62.81)     Time: 1345-1410 PT Time Calculation (min) (ACUTE ONLY): 25 min  Charges:  $Gait Training: 8-22 mins $Therapeutic Activity: 8-22 mins                     Rica Koyanagi  PTA Acute  Rehabilitation Services Pager      [phone] Office      [phone]

## 2018-10-06 NOTE — Progress Notes (Signed)
Pt. Found on CPAP, oxygen remains in circuit.

## 2018-10-06 NOTE — Progress Notes (Signed)
Pt with spontaneous large void in his bed this am, urine was noted to be blood tinged with several tiny clots around his meatus.

## 2018-10-06 NOTE — Progress Notes (Signed)
Physical Therapy Treatment Patient Details Name: Jacob Bishop MRN: [id number] DOB: 03/27/1941 Today's Date: 10/06/2018    History of Present Illness 78 yo male s/p R TKR on 10/04/18. PMH includes OA, carpal tunnel, BPPV, CAD with coronary stent placement 1997, OSA, DMII, HTN, HLD, obesity, CHF.     PT Comments    POD # 2 am session Pt found in bed soaked with urine with slight reddish blood.  Pt required increased assist to sit to EOB.  Pt required increased assist to stand with increased c/o dizziness "woozy".  Vitals taken: Sitting EOB BP 116/65, HR 103, RA 91% Standing BP 113/63, HR 119, RA 90 % After amb 5 feet BP 132/69, HR 124, RA 89% (increased dizziness) Pt has a Hx of Vertigo.  BP's maintaining.  ? Oxy pain meds as pt stated "I never been able to handle those".  RN in room during statement.  Pt unable to tolerate further PT due to shaky, unsteady gait and poor balance.  Pt assisted to recliner.  Unable to attempt stairs for D/C today.  Will return this after noon for another PT session.   Follow Up Recommendations  Follow surgeon's recommendation for DC plan and follow-up therapies;Supervision for mobility/OOB     Equipment Recommendations  Rolling walker with 5" wheels    Recommendations for Other Services       Precautions / Restrictions Precautions Precautions: Fall Precaution Comments: instructed on KI use and proper application  Required Braces or Orthoses: Knee Immobilizer - Right Knee Immobilizer - Right: On when out of bed or walking;Discontinue once straight leg raise with < 10 degree lag Restrictions Weight Bearing Restrictions: No Other Position/Activity Restrictions: WBAT     Mobility  Bed Mobility Overal bed mobility: Needs Assistance Bed Mobility: Supine to Sit     Supine to sit: Max assist     General bed mobility comments: required increased assist this session with difficulty self scooting and increased assist with upper body.     Transfers Overall transfer level: Needs assistance Equipment used: Rolling walker (2 wheeled) Transfers: Sit to/from [facility] Sit to Stand: Mod assist Stand pivot transfers: Mod assist       General transfer comment: 50% VC's on proper hand placment and VC's to advance R LE prior to sit.  Required increased assist this session  Ambulation/Gait Ambulation/Gait assistance: Min assist;Mod assist Gait Distance (Feet): 5 Feet Assistive device: Rolling walker (2 wheeled) Gait Pattern/deviations: Step-to pattern;Decreased stance time - right;Decreased weight shift to right;Antalgic;Trunk flexed Gait velocity: decreased    General Gait Details: very unsteady gait with increased c/o dizziness, "feeling woozy" (meds?) increased fall risk    Stairs             Wheelchair Mobility    Modified Rankin (Stroke Patients Only)       Balance                                            Cognition Arousal/Alertness: Awake/alert Behavior During Therapy: WFL for tasks assessed/performed Overall Cognitive Status: Within Functional Limits for tasks assessed                                 General Comments: increased c/o dizziness, feeling 'woozy" when standing       Exercises  General Comments        Pertinent Vitals/Pain Pain Assessment: 0-10 Pain Score: 8  Pain Location: R knee  Pain Descriptors / Indicators: Grimacing;Discomfort;Operative site guarding;Tender;Throbbing Pain Intervention(s): Monitored during session;Repositioned;Premedicated before session;Ice applied    Home Living                      Prior Function            PT Goals (current goals can now be found in the care plan section) Progress towards PT goals: Progressing toward goals    Frequency    7X/week      PT Plan Current plan remains appropriate    Co-evaluation              AM-PAC PT "6 Clicks" Mobility   Outcome  Measure  Help needed turning from your back to your side while in a flat bed without using bedrails?: A Lot Help needed moving from lying on your back to sitting on the side of a flat bed without using bedrails?: A Lot Help needed moving to and from a bed to a chair (including a wheelchair)?: A Lot Help needed standing up from a chair using your arms (e.g., wheelchair or bedside chair)?: A Lot Help needed to walk in hospital room?: A Lot Help needed climbing 3-5 steps with a railing? : Total 6 Click Score: 11    End of Session Equipment Utilized During Treatment: Gait belt;Right knee immobilizer Activity Tolerance: Patient limited by pain;Treatment limited secondary to medical complications (Comment)(dizzy) Patient left: in chair;with call bell/phone within reach;with family/visitor present Nurse Communication: (RN in room during ) PT Visit Diagnosis: Other abnormalities of gait and mobility (R26.89);Difficulty in walking, not elsewhere classified (R26.2);Muscle weakness (generalized) (M62.81)     Time: 8144-8185 PT Time Calculation (min) (ACUTE ONLY): 30 min  Charges:  $Gait Training: 8-22 mins $Therapeutic Activity: 8-22 mins                     Rica Koyanagi  PTA Acute  Rehabilitation Services Pager      [phone] Office      [phone]

## 2018-10-07 LAB — CBC
HCT: 31.7 % — ABNORMAL LOW (ref 39.0–52.0)
Hemoglobin: 10.5 g/dL — ABNORMAL LOW (ref 13.0–17.0)
MCH: 31.5 pg (ref 26.0–34.0)
MCHC: 33.1 g/dL (ref 30.0–36.0)
MCV: 95.2 fL (ref 80.0–100.0)
Platelets: 77 10*3/uL — ABNORMAL LOW (ref 150–400)
RBC: 3.33 MIL/uL — AB (ref 4.22–5.81)
RDW: 13.2 % (ref 11.5–15.5)
WBC: 5.6 10*3/uL (ref 4.0–10.5)
nRBC: 0 % (ref 0.0–0.2)

## 2018-10-07 LAB — GLUCOSE, CAPILLARY
GLUCOSE-CAPILLARY: 160 mg/dL — AB (ref 70–99)
Glucose-Capillary: 182 mg/dL — ABNORMAL HIGH (ref 70–99)
Glucose-Capillary: 216 mg/dL — ABNORMAL HIGH (ref 70–99)
Glucose-Capillary: 141 mg/dL — ABNORMAL HIGH (ref 70–99)

## 2018-10-07 MED ORDER — ASPIRIN 325 MG PO TBEC: 325.0000 mg | DELAYED_RELEASE_TABLET | Freq: Two times a day (BID) | ORAL | 0 refills | Status: DC

## 2018-10-07 NOTE — Progress Notes (Signed)
PT Cancellation Note  Patient Details Name: Jacob Bishop MRN: [id number] DOB: 1940/10/18   Cancelled PM Treatment:     pt has been up and down to [facility] four times this shift to attempt to have BM.  Each time pt required + 2 assist due to fatigue and balance instability.  Pt too wore out to participate in his PM PT session and back in bed.  Spouse very concerned about taking pt home as she is unable to physically assist at current level of need.  Consulted LPT and RN pt's poor mobility progress and + 2 assist level.   Pt unsafe to D/C to home today.  Will see in morning.  May need ST Rehab at SNF prior to D/C home.   Rica Koyanagi  PTA Acute  Rehabilitation Services Pager      [phone] Office      [phone]

## 2018-10-07 NOTE — Progress Notes (Signed)
Subjective: 3 Days Post-Op Procedure(s) (LRB): RIGHT TOTAL KNEE ARTHROPLASTY (Right) Patient reports pain as mild.   Patient seen in rounds with Dr. Wynelle Link. Patient is having continued issues with urinary retention. Required I&O catheterization twice last night. Denies chest pain or SOB. Reports pain in the right knee is continuing to improve.  Plan is to go Home after hospital stay.  Objective: Vital signs in last 24 hours: Temp:  [98.1 F (36.7 C)-100.9 F (38.3 C)] 98.4 F (36.9 C) (02/13 0616) Pulse Rate:  [96-129] 96 (02/13 0616) Resp:  [15-18] 15 (02/13 0616) BP: (108-132)/(63-70) 108/66 (02/13 0616) SpO2:  [83 %-96 %] 92 % (02/13 0616)  Intake/Output from previous day:  Intake/Output Summary (Last 24 hours) at 10/07/2018 0741 Last data filed at 10/07/2018 0600 Gross per 24 hour  Intake 600 ml  Output 1750 ml  Net -1150 ml    Labs: Recent Labs    10/04/18 1611 10/05/18 0343 10/06/18 0521 10/07/18 0515  HGB 13.5 12.2* 11.5* 10.5*   Recent Labs    10/06/18 0521 10/07/18 0515  WBC 6.7 5.6  RBC 3.60* 3.33*  HCT 34.3* 31.7*  PLT 73* 77*   Recent Labs    10/05/18 0343 10/06/18 0521  NA 137 137  K 4.5 4.0  CL 102 102  CO2 26 27  BUN 16 21  CREATININE 0.78 0.89  GLUCOSE 171* 181*  CALCIUM 8.3* 8.3*   Exam: General - Patient is Alert and Oriented Extremity - Neurologically intact Neurovascular intact Sensation intact distally Dorsiflexion/Plantar flexion intact Dressing/Incision - clean, dry, no drainage Motor Function - intact, moving foot and toes well on exam.   Past Medical History:  Diagnosis Date  . AI (aortic insufficiency) 01/11/2018   Trace, noted on ECHO  . Arthritis   . CAD in native artery    Nuclear stress test 10/19: EF 58, normal perfusion, low risk  . Cellulitis   . Dermatitis   . Diabetes mellitus    type II  . Diastolic dysfunction 48/18/5631   Mild, noted on ECHO  . Diverticulosis 01/31/2016   ASCENDING COLON AND  CECUM, NOTED ON COLONOSCOPY  . DOE (dyspnea on exertion)   . Edema   . Fatty liver 09/18/2004   Noted on [place] ABD  . History of kidney stones 05/23/2002   Noted on CT Abd  . Hx of colonic polyps 01/31/2016  . Hyperlipidemia   . Hypertension   . Internal hemorrhoids 01/31/2016   Noted on colonoscopy  . Obesity   . OSA (obstructive sleep apnea)    cpap  . Positional vertigo   . Pulmonary hypertension ([place]) 01/11/2018   Mild, noted on ECHO  . Rhinosinusitis   . Skin lesion   . TR (tricuspid regurgitation) 01/11/2018   Trace, noted on ECHO    Assessment/Plan: 3 Days Post-Op Procedure(s) (LRB): RIGHT TOTAL KNEE ARTHROPLASTY (Right) Principal Problem:   Osteoarthritis of right knee Active Problems:   OA (osteoarthritis) of knee  Estimated body mass index is 39.76 kg/m as calculated from the following:   Height as of this encounter: 5\' 11"  (1.803 m).   Weight as of this encounter: 129.3 kg. Up with therapy  DVT Prophylaxis - Aspirin Weight-bearing as tolerated  Oxygen saturation has improved on room air and patient is not experiencing shortness of breath. Will not require further orders.  Possible discharge this afternoon if he is meeting his goals with therapy. If he is unable to void on his own, will require placement of  a foley catheter and will need to follow-up with his established urologist in 1 week.   Theresa Duty, PA-C Orthopedic Surgery 10/07/2018, 7:41 AM '

## 2018-10-07 NOTE — Care Management Important Message (Signed)
Important Message  Patient Details  Name: Jacob Bishop MRN: [id number] Date of Birth: [birth year]   Medicare Important Message Given:  Yes    Kerin Salen 10/07/2018, 10:13 AMImportant Message  Patient Details  Name: Jacob Bishop MRN: [id number] Date of Birth: 1941-02-19   Medicare Important Message Given:  Yes    Kerin Salen 10/07/2018, 10:13 AM

## 2018-10-07 NOTE — Progress Notes (Signed)
Physical Therapy Treatment Patient Details Name: Jacob Bishop MRN: [id number] DOB: 11/28/1940 Today's Date: 10/07/2018    History of Present Illness 78 yo male s/p R TKR on 10/04/18. PMH includes OA, carpal tunnel, BPPV, CAD with coronary stent placement 1997, OSA, DMII, HTN, HLD, obesity, CHF.     PT Comments    POD # 3 am session Pt progressing slowly and still required + 2 assist to get OOB, transfer on/off BSC and amb.  General Gait Details: limited distance due to MAX c/o fatigue and 2/4 dyspnea.  Recliner following for safety.  RA decreased to 87% and HR increased to 135.  Pt is suppose to D/C to home and has steps to enter home. Will return later today and attempt again.    Follow Up Recommendations  Follow surgeon's recommendation for DC plan and follow-up therapies;Supervision for mobility/OOB     Equipment Recommendations  Rolling walker with 5" wheels    Recommendations for Other Services       Precautions / Restrictions Precautions Precautions: Fall Precaution Comments: did not use KI due to increased difficulty performing sit to stand and stand to sit Restrictions Weight Bearing Restrictions: No Other Position/Activity Restrictions: WBAT     Mobility  Bed Mobility Overal bed mobility: Needs Assistance Bed Mobility: Supine to Sit     Supine to sit: Max assist;+2 for physical assistance     General bed mobility comments: pt required MAX assist to transfer to EOB with much effort and difficulty scooting with c/o MAX fatigue   Transfers Overall transfer level: Needs assistance Equipment used: Rolling walker (2 wheeled) Transfers: Sit to/from [facility] Sit to Stand: Max assist;+2 safety/equipment;From elevated surface         General transfer comment: pt still required + 2 MAX Assist to rise with initial posterior LOB and difficulty self adjusting to midline.   Also assisted on/off BSC + 2 assist.  Ambulation/Gait Ambulation/Gait  assistance: Mod assist;+2 physical assistance;+2 safety/equipment Gait Distance (Feet): 9 Feet Assistive device: Rolling walker (2 wheeled) Gait Pattern/deviations: Step-to pattern;Decreased stance time - right;Decreased weight shift to right;Antalgic;Trunk flexed Gait velocity: decreased    General Gait Details: limited distance due to MAX c/o fatigue and 2/4 dyspnea.  Recliner following for safety.  RA decreased to 87% and HR increased to 135   Stairs Stairs: (unable to attempt)           Wheelchair Mobility    Modified Rankin (Stroke Patients Only)       Balance                                            Cognition Arousal/Alertness: Awake/alert Behavior During Therapy: WFL for tasks assessed/performed Overall Cognitive Status: Within Functional Limits for tasks assessed                                 General Comments: "Pain is better" but pt c/o MAX fatigue and dizziness with position change       Exercises      General Comments        Pertinent Vitals/Pain Pain Assessment: 0-10 Pain Score: 3  Pain Location: R knee  Pain Descriptors / Indicators: Dull;Operative site guarding Pain Intervention(s): Monitored during session;Repositioned;Ice applied    Home Living  Prior Function            PT Goals (current goals can now be found in the care plan section) Progress towards PT goals: Progressing toward goals    Frequency    7X/week      PT Plan Current plan remains appropriate    Co-evaluation              AM-PAC PT "6 Clicks" Mobility   Outcome Measure  Help needed turning from your back to your side while in a flat bed without using bedrails?: A Lot Help needed moving from lying on your back to sitting on the side of a flat bed without using bedrails?: A Lot Help needed moving to and from a bed to a chair (including a wheelchair)?: A Lot Help needed standing up from a  chair using your arms (e.g., wheelchair or bedside chair)?: A Lot Help needed to walk in hospital room?: A Lot Help needed climbing 3-5 steps with a railing? : Total 6 Click Score: 11    End of Session Equipment Utilized During Treatment: Gait belt Activity Tolerance: Patient limited by fatigue Patient left: in chair Nurse Communication: Mobility status(pt still requires + 2 assist) PT Visit Diagnosis: Other abnormalities of gait and mobility (R26.89);Difficulty in walking, not elsewhere classified (R26.2);Muscle weakness (generalized) (M62.81)     Time: 9169-4503 PT Time Calculation (min) (ACUTE ONLY): 31 min  Charges:  $Gait Training: 8-22 mins $Therapeutic Activity: 8-22 mins                     Rica Koyanagi  PTA Acute  Rehabilitation Services Pager      [phone] Office      [phone]

## 2018-10-08 LAB — GLUCOSE, CAPILLARY
GLUCOSE-CAPILLARY: 155 mg/dL — AB (ref 70–99)
Glucose-Capillary: 171 mg/dL — ABNORMAL HIGH (ref 70–99)
Glucose-Capillary: 146 mg/dL — ABNORMAL HIGH (ref 70–99)
Glucose-Capillary: 165 mg/dL — ABNORMAL HIGH (ref 70–99)

## 2018-10-08 MED ORDER — ASPIRIN 325 MG PO TBEC: 325.0000 mg | DELAYED_RELEASE_TABLET | Freq: Two times a day (BID) | ORAL | 0 refills | Status: DC

## 2018-10-08 NOTE — Progress Notes (Signed)
Physical Therapy Treatment Patient Details Name: Jacob Bishop MRN: [id number] DOB: 1941-08-09 Today's Date: 10/08/2018    History of Present Illness 78 yo male s/p R TKR on 10/04/18. PMH includes OA, carpal tunnel, BPPV, CAD with coronary stent placement 1997, OSA, DMII, HTN, HLD, obesity, CHF.     PT Comments    POD # 4 pm session Pt was unable to self rise from recliner and required + 2 side by side assist.  General transfer comment: pt required + 2 side by side assist to rise from recliner level.  Spouse present ans concerned. "How I'm I suppose to get him in and out of my car?".  Also assisted pt on/off BSC for  a small BM.  Posterior LOB x 3.  "I'm shaking" stated pt.  Limited amb distance and attempt to perform stairs.  General stair comments: attempted stairs required + 2 Max Assist and B rails which pt does not have at home.  Greatr difficulty off loading L LE to attempt step up.  Heavy lean on B rails.  Which at home pt only has one.  Spouse observed.  "Theres no way I can get him into the house".   Pt progressing slowly and will need ST Rehab at SNF prior to safely returning home.    Follow Up Recommendations  SNF     Equipment Recommendations  Rolling walker with 5" wheels    Recommendations for Other Services       Precautions / Restrictions Precautions Precautions: Fall Precaution Comments: did wear KI this session for increased stability as pt c/o weakness  Required Braces or Orthoses: Knee Immobilizer - Right Restrictions Weight Bearing Restrictions: No Other Position/Activity Restrictions: WBAT     Mobility  Bed Mobility Overal bed mobility: Needs Assistance Bed Mobility: Supine to Sit     Supine to sit: Max assist     General bed mobility comments: Pt OOB in recliner  Transfers Overall transfer level: Needs assistance Equipment used: Rolling walker (2 wheeled)   Sit to Stand: Max assist;+2 safety/equipment;From elevated surface         General  transfer comment: pt required + 2 side by side assist to rise from recliner level.  Spouse present ans concerned. "How I'm I suppose to get him in and out of my car?".  Also assisted pt on/off BSC for  a small BM.  Posterior LOB x 3.  "I'm shaking" stated pt.    Ambulation/Gait Ambulation/Gait assistance: Mod assist Gait Distance (Feet): 6 Feet Assistive device: Rolling walker (2 wheeled) Gait Pattern/deviations: Step-to pattern;Decreased stance time - right;Decreased weight shift to right;Antalgic;Trunk flexed Gait velocity: decreased   General Gait Details: posterior LOB x 3 with initial sit to stand therapist recovered.  Rehab tech was needed for + 2 assist.  KI in place for increased stability.     Stairs Stairs: Yes Stairs assistance: Max assist;+2 physical assistance Stair Management: Two rails;Step to pattern Number of Stairs: 2 General stair comments: attempted stairs required + 2 Max Assist and B rails which pt does not have at home.  Greatr difficulty off loading L LE to attempt step up.  Heavy lean on B rails.  Which at home pt only has one.  Spouse observed.  "Theres no way I can get him into the house".     Wheelchair Mobility    Modified Rankin (Stroke Patients Only)       Balance  Cognition Arousal/Alertness: Awake/alert Behavior During Therapy: WFL for tasks assessed/performed Overall Cognitive Status: Within Functional Limits for tasks assessed                                 General Comments: pt appears at prior level of cognition (much improved)      Exercises      General Comments        Pertinent Vitals/Pain Pain Assessment: 0-10(pain well controlled ) Pain Score: 4  Pain Location: R knee  Pain Descriptors / Indicators: Sore Pain Intervention(s): Monitored during session;Repositioned;Premedicated before session;Ice applied    Home Living                       Prior Function            PT Goals (current goals can now be found in the care plan section) Progress towards PT goals: Progressing toward goals    Frequency    7X/week      PT Plan Current plan remains appropriate    Co-evaluation              AM-PAC PT "6 Clicks" Mobility   Outcome Measure  Help needed turning from your back to your side while in a flat bed without using bedrails?: A Lot Help needed moving from lying on your back to sitting on the side of a flat bed without using bedrails?: A Lot Help needed moving to and from a bed to a chair (including a wheelchair)?: A Lot Help needed standing up from a chair using your arms (e.g., wheelchair or bedside chair)?: A Lot Help needed to walk in hospital room?: A Lot Help needed climbing 3-5 steps with a railing? : A Lot 6 Click Score: 12    End of Session Equipment Utilized During Treatment: Gait belt Activity Tolerance: Patient limited by fatigue Patient left: in chair Nurse Communication: Mobility status PT Visit Diagnosis: Other abnormalities of gait and mobility (R26.89);Difficulty in walking, not elsewhere classified (R26.2);Muscle weakness (generalized) (M62.81)     Time: 1340-1405 PT Time Calculation (min) (ACUTE ONLY): 25 min  Charges:  $Gait Training: 8-22 mins $Therapeutic Activity: 8-22 mins                     Rica Koyanagi  PTA Acute  Rehabilitation Services Pager      [phone] Office      [phone]

## 2018-10-08 NOTE — Progress Notes (Signed)
   Subjective: 4 Days Post-Op Procedure(s) (LRB): RIGHT TOTAL KNEE ARTHROPLASTY (Right) Patient reports pain as mild.   Patient seen in rounds by Dr. Wynelle Link. Patient is well, and has had no acute complaints or problems other than pain in the right knee. Foley catheter placed yesterday due to continued issues with urinary retention. Positive BM. Plan is to go Home after hospital stay.  Objective: Vital signs in last 24 hours: Temp:  [98.2 F (36.8 C)-99.2 F (37.3 C)] 99.2 F (37.3 C) (02/14 0529) Pulse Rate:  [87-119] 87 (02/14 0529) Resp:  [16-17] 17 (02/14 0529) BP: (117-132)/(65-68) 129/65 (02/14 0529) SpO2:  [90 %-95 %] 91 % (02/14 0529)  Intake/Output from previous day:  Intake/Output Summary (Last 24 hours) at 10/08/2018 0726 Last data filed at 10/08/2018 0600 Gross per 24 hour  Intake 700 ml  Output 1571 ml  Net -871 ml    Labs: Recent Labs    10/06/18 0521 10/07/18 0515  HGB 11.5* 10.5*   Recent Labs    10/06/18 0521 10/07/18 0515  WBC 6.7 5.6  RBC 3.60* 3.33*  HCT 34.3* 31.7*  PLT 73* 77*   Recent Labs    10/06/18 0521  NA 137  K 4.0  CL 102  CO2 27  BUN 21  CREATININE 0.89  GLUCOSE 181*  CALCIUM 8.3*   Exam: General - Patient is Alert and Oriented Extremity - Neurologically intact Neurovascular intact Sensation intact distally Dorsiflexion/Plantar flexion intact Dressing/Incision - clean, dry, no drainage Motor Function - intact, moving foot and toes well on exam.   Past Medical History:  Diagnosis Date  . AI (aortic insufficiency) 01/11/2018   Trace, noted on ECHO  . Arthritis   . CAD in native artery    Nuclear stress test 10/19: EF 58, normal perfusion, low risk  . Cellulitis   . Dermatitis   . Diabetes mellitus    type II  . Diastolic dysfunction 30/02/6225   Mild, noted on ECHO  . Diverticulosis 01/31/2016   ASCENDING COLON AND CECUM, NOTED ON COLONOSCOPY  . DOE (dyspnea on exertion)   . Edema   . Fatty liver 09/18/2004     Noted on [place] ABD  . History of kidney stones 05/23/2002   Noted on CT Abd  . Hx of colonic polyps 01/31/2016  . Hyperlipidemia   . Hypertension   . Internal hemorrhoids 01/31/2016   Noted on colonoscopy  . Obesity   . OSA (obstructive sleep apnea)    cpap  . Positional vertigo   . Pulmonary hypertension ([place]) 01/11/2018   Mild, noted on ECHO  . Rhinosinusitis   . Skin lesion   . TR (tricuspid regurgitation) 01/11/2018   Trace, noted on ECHO    Assessment/Plan: 4 Days Post-Op Procedure(s) (LRB): RIGHT TOTAL KNEE ARTHROPLASTY (Right) Principal Problem:   Osteoarthritis of right knee Active Problems:   OA (osteoarthritis) of knee  Estimated body mass index is 39.76 kg/m as calculated from the following:   Height as of this encounter: 5\' 11"  (1.803 m).   Weight as of this encounter: 129.3 kg. Up with therapy D/C IV fluids  DVT Prophylaxis - Aspirin Weight-bearing as tolerated  Will require catheter with leg bag upon discharge. Follow-up with urologist in 1 week. Discussed with patient.  Depending on how therapy goes this AM, will either discharge home or will require SNF.   Theresa Duty, PA-C Orthopedic Surgery 10/08/2018, 7:26 AM

## 2018-10-08 NOTE — Progress Notes (Signed)
Physical Therapy Treatment Patient Details Name: Jacob Bishop MRN: [id number] DOB: Nov 01, 1940 Today's Date: 10/08/2018    History of Present Illness 78 yo male s/p R TKR on 10/04/18. PMH includes OA, carpal tunnel, BPPV, CAD with coronary stent placement 1997, OSA, DMII, HTN, HLD, obesity, CHF.     PT Comments    POD # 4 am session Pt progressing slowly and still required much assist.  General bed mobility comments: Pt required MAX Assist + 1 vs 2 this session.  HOB elevated and increased time to complete scooting.  General transfer comment: pt still required + 2 MAX assist to rise from elevated bed and also assisted on/off BSC.  Required increased assist to prevent posterior lean as pt had difficulty self adjusting to midline.  General Gait Details: limited distance due to MAX c/o fatigue and mild dyspnea.  Remains shaky and unsteady even with KI use for support.     Follow Up Recommendations  SNF     Equipment Recommendations  Rolling walker with 5" wheels    Recommendations for Other Services       Precautions / Restrictions Precautions Precautions: Fall Precaution Comments: did wear KI this session for increased stability as pt c/o weakness  Required Braces or Orthoses: Knee Immobilizer - Right Restrictions Weight Bearing Restrictions: No Other Position/Activity Restrictions: WBAT     Mobility  Bed Mobility Overal bed mobility: Needs Assistance Bed Mobility: Supine to Sit     Supine to sit: Max assist     General bed mobility comments: Pt required MAX Assist + 1 vs 2 this session.  HOB elevated and increased time to complete scooting.    Transfers Overall transfer level: Needs assistance Equipment used: Rolling walker (2 wheeled)   Sit to Stand: Max assist;+2 safety/equipment;From elevated surface         General transfer comment: pt still required + 2 MAX assist to rise from elevated bed and also assisted on/off BSC.  Required increased assist to prevent  posterior lean as pt had difficulty self adjusting to midline.    Ambulation/Gait Ambulation/Gait assistance: Mod assist Gait Distance (Feet): 14 Feet Assistive device: Rolling walker (2 wheeled) Gait Pattern/deviations: Step-to pattern;Decreased stance time - right;Decreased weight shift to right;Antalgic;Trunk flexed Gait velocity: decreased    General Gait Details: limited distance due to MAX c/o fatigue and mild dyspnea.  Remains shaky and unsteady even with KI use for support.     Stairs             Wheelchair Mobility    Modified Rankin (Stroke Patients Only)       Balance                                            Cognition Arousal/Alertness: Awake/alert Behavior During Therapy: WFL for tasks assessed/performed Overall Cognitive Status: Within Functional Limits for tasks assessed                                 General Comments: pt appears at prior level of cognition (much improved)      Exercises      General Comments        Pertinent Vitals/Pain Pain Assessment: 0-10(pain well controlled ) Pain Score: 4  Pain Location: R knee  Pain Descriptors / Indicators: Sore Pain Intervention(s): Monitored during  session;Repositioned;Premedicated before session;Ice applied    Home Living                      Prior Function            PT Goals (current goals can now be found in the care plan section) Progress towards PT goals: Progressing toward goals    Frequency    7X/week      PT Plan Current plan remains appropriate    Co-evaluation              AM-PAC PT "6 Clicks" Mobility   Outcome Measure  Help needed turning from your back to your side while in a flat bed without using bedrails?: A Lot Help needed moving from lying on your back to sitting on the side of a flat bed without using bedrails?: A Lot Help needed moving to and from a bed to a chair (including a wheelchair)?: A Lot Help needed  standing up from a chair using your arms (e.g., wheelchair or bedside chair)?: A Lot Help needed to walk in hospital room?: A Lot Help needed climbing 3-5 steps with a railing? : A Lot 6 Click Score: 12    End of Session Equipment Utilized During Treatment: Gait belt Activity Tolerance: Patient limited by fatigue Patient left: in chair Nurse Communication: Mobility status PT Visit Diagnosis: Other abnormalities of gait and mobility (R26.89);Difficulty in walking, not elsewhere classified (R26.2);Muscle weakness (generalized) (M62.81)     Time: 1015-1050 PT Time Calculation (min) (ACUTE ONLY): 35 min  Charges:  $Gait Training: 8-22 mins $Therapeutic Activity: 8-22 mins                     Rica Koyanagi  PTA Acute  Rehabilitation Services Pager      [phone] Office      [phone]

## 2018-10-09 LAB — GLUCOSE, CAPILLARY
Glucose-Capillary: 149 mg/dL — ABNORMAL HIGH (ref 70–99)
Glucose-Capillary: 193 mg/dL — ABNORMAL HIGH (ref 70–99)
Glucose-Capillary: 155 mg/dL — ABNORMAL HIGH (ref 70–99)
Glucose-Capillary: 230 mg/dL — ABNORMAL HIGH (ref 70–99)

## 2018-10-09 NOTE — Care Management (Signed)
Pt will call to reschedule his OPPT, which was scheduled for 10/08/2018. He has RW. Jonnie Finner RN CCM Case Mgmt phone [phone]

## 2018-10-09 NOTE — Progress Notes (Signed)
Subjective: 5 Days Post-Op Procedure(s) (LRB): RIGHT TOTAL KNEE ARTHROPLASTY (Right) Patient reports pain as 4 on 0-10 scale and mild.   No issues over night. Tolerating PO, no nausea. +Foley, +BM, +Flatus. Denies SOB, CP, calf pain, sweats/chills. Objective: Vital signs in last 24 hours: Temp:  [98.5 F (36.9 C)-98.9 F (37.2 C)] 98.5 F (36.9 C) (02/15 0525) Pulse Rate:  [65-102] 91 (02/15 0525) Resp:  [16] 16 (02/14 1453) BP: (126-153)/(68-78) 153/78 (02/15 0525) SpO2:  [92 %-94 %] 93 % (02/15 0525)  Intake/Output from previous day: 02/14 0701 - 02/15 0700 In: 520 [P.O.:520] Out: 1126 [Urine:1125; Stool:1] Intake/Output this shift: No intake/output data recorded.  Recent Labs    10/07/18 0515  HGB 10.5*   Recent Labs    10/07/18 0515  WBC 5.6  RBC 3.33*  HCT 31.7*  PLT 77*   No results for input(s): NA, K, CL, CO2, BUN, CREATININE, GLUCOSE, CALCIUM in the last 72 hours. No results for input(s): LABPT, INR in the last 72 hours.  Neurologically intact ABD soft Neurovascular intact Sensation intact distally Intact pulses distally Dorsiflexion/Plantar flexion intact Incision: dressing C/D/I No cellulitis present Compartment soft, no palpable cords, Homan's - Bilaterally.   Assessment/Plan: 5 Days Post-Op Procedure(s) (LRB): RIGHT TOTAL KNEE ARTHROPLASTY (Right) Up with therapy WBAT Continue Foley upon d/c f/u outpatient with urologist. DVT prophylaxis: ASA, SCDs, Teds, ambulation D/C Monday patient requesting SNF.     Yvonne Kendall Ward 10/09/2018, 8:28 AM

## 2018-10-09 NOTE — Plan of Care (Signed)
  Problem: Clinical Measurements: Goal: Ability to maintain clinical measurements within normal limits will improve Outcome: Progressing   Problem: Clinical Measurements: Goal: Diagnostic test results will improve Outcome: Progressing   Problem: Nutrition: Goal: Adequate nutrition will be maintained Outcome: Progressing   Problem: Coping: Goal: Level of anxiety will decrease Outcome: Progressing   Problem: Clinical Measurements: Goal: Diagnostic test results will improve Outcome: Progressing

## 2018-10-09 NOTE — Progress Notes (Signed)
Physical Therapy Treatment Patient Details Name: Jacob Bishop MRN: [id number] DOB: 27-Oct-1940 Today's Date: 10/09/2018    History of Present Illness 78 yo male s/p R TKR on 10/04/18. PMH includes OA, carpal tunnel, BPPV, CAD with coronary stent placement 1997, OSA, DMII, HTN, HLD, obesity, CHF.     PT Comments    POD # 5 Pt progressing slowly due to c/o "weakness" and "fatigue".  Pt still requires + 2 assist for mobility and transfers.   General bed mobility comments: pt struggles with upper body and self scooting and continues to need MAX Assist with supine to sit and sit to supine due to weakness and ABD girth.  General transfer comment: pt required bed to be elevated and + 2 side by side assist as pt is present with posterior lean and difficulty self correcting to midline.  Turns are unasteady and pt requires assist to control decends (stand to sit)  Assisted with several sit to stands to sit on/off BSC for hygiene.  General Gait Details: remains unsteady with poor self correction to upright/midline posture.  Max C/O B UE weakness and limited distance.  "Let me catch my breath".  RA 97% and HR 109  Assisted pt with several sit to stand for a wash up then assisted back to bed to perform some TKR TE's followed by ICE.   Spouse physically unable to assist pt and plans for SNF.     Follow Up Recommendations  SNF     Equipment Recommendations       Recommendations for Other Services       Precautions / Restrictions Precautions Precautions: Fall Precaution Comments: did not use wear KI Restrictions Weight Bearing Restrictions: No Other Position/Activity Restrictions: WBAT     Mobility  Bed Mobility Overal bed mobility: Needs Assistance Bed Mobility: Supine to Sit     Supine to sit: Max assist     General bed mobility comments: pt struggles with upper body and self scooting and continues to need MAX Assist with supine to sit and sit to supine due to weakness and ABD  girth.  Transfers Overall transfer level: Needs assistance Equipment used: Rolling walker (2 wheeled) Transfers: Sit to/from [facility] Sit to Stand: Max assist;+2 safety/equipment;From elevated surface Stand pivot transfers: Mod assist;+2 safety/equipment       General transfer comment: pt required bed to be elevated and + 2 side by side assist as pt is present with posterior lean and difficulty self correcting to midline.  Turns are unasteady and pt requires assist to ciontrol decends (stand to sit)  Assisted with several sit to stands to sit on/off BSC for hygiene.    Ambulation/Gait Ambulation/Gait assistance: Mod assist;+2 physical assistance;+2 safety/equipment Gait Distance (Feet): 22 Feet Assistive device: Rolling walker (2 wheeled)   Gait velocity: decreased    General Gait Details: remains unsteady with poor self correction to upright/midline posture.  Max C/O B UE weakness and limited distance.     Stairs             Wheelchair Mobility    Modified Rankin (Stroke Patients Only)       Balance                                            Cognition Arousal/Alertness: Awake/alert Behavior During Therapy: WFL for tasks assessed/performed Overall Cognitive Status: Within Functional Limits for  tasks assessed                                 General Comments: pt appears at prior level of cognition (much improved)      Exercises  10 reps AP, Knee Presses, HS AAROM     General Comments        Pertinent Vitals/Pain Pain Assessment: 0-10 Pain Score: 3  Pain Location: R knee  Pain Descriptors / Indicators: Sore;Tender Pain Intervention(s): Monitored during session;Repositioned;Ice applied    Home Living                      Prior Function            PT Goals (current goals can now be found in the care plan section) Progress towards PT goals: Progressing toward goals    Frequency     7X/week      PT Plan Current plan remains appropriate    Co-evaluation              AM-PAC PT "6 Clicks" Mobility   Outcome Measure  Help needed turning from your back to your side while in a flat bed without using bedrails?: A Lot Help needed moving from lying on your back to sitting on the side of a flat bed without using bedrails?: A Lot Help needed moving to and from a bed to a chair (including a wheelchair)?: A Lot Help needed standing up from a chair using your arms (e.g., wheelchair or bedside chair)?: A Lot Help needed to walk in hospital room?: A Lot Help needed climbing 3-5 steps with a railing? : A Lot 6 Click Score: 12    End of Session Equipment Utilized During Treatment: Gait belt Activity Tolerance: Patient limited by fatigue Patient left: in bed;with call bell/phone within reach;with family/visitor present Nurse Communication: (heat rash on upper back noticed) PT Visit Diagnosis: Other abnormalities of gait and mobility (R26.89);Difficulty in walking, not elsewhere classified (R26.2);Muscle weakness (generalized) (M62.81)     Time: 4332-9518 PT Time Calculation (min) (ACUTE ONLY): 45 min  Charges:  $Gait Training: 8-22 mins $Therapeutic Exercise: 8-22 mins $Therapeutic Activity: 8-22 mins                     Rica Koyanagi  PTA Acute  Rehabilitation Services Pager      [phone] Office      [phone]

## 2018-10-10 LAB — GLUCOSE, CAPILLARY
GLUCOSE-CAPILLARY: 160 mg/dL — AB (ref 70–99)
GLUCOSE-CAPILLARY: 201 mg/dL — AB (ref 70–99)
Glucose-Capillary: 169 mg/dL — ABNORMAL HIGH (ref 70–99)
Glucose-Capillary: 184 mg/dL — ABNORMAL HIGH (ref 70–99)
Glucose-Capillary: 194 mg/dL — ABNORMAL HIGH (ref 70–99)

## 2018-10-10 NOTE — Consult Note (Signed)
Urology Consult Note   Requesting Attending Physician:  Gaynelle Arabian, MD Service Providing Consult: Urology  Consulting Attending: Nicolette Bang, MD   Reason for Consult: Urinary retention  HPI: Jacob Bishop is seen in consultation for reasons noted above at the request of Gaynelle Arabian, MD.  This is a 78 y.o. male with history of nephrolithiasis, BPH/LUTS on Flomax who is s/p right total knee 2/10 with course c/b post-operative urinary retention. Patient failed TOV and catheter later replaced 2/13. Today, patient reported suprapubic discomfort and was noted with blood clot in tubing. Nurses attempted to flush catheter, but were unsuccessful and his catheter was ultimately removed. Patient was unable to urinate after catheter removal. Urology was requested for assistance with replacement.   Past Medical History: Past Medical History:  Diagnosis Date  . AI (aortic insufficiency) 01/11/2018   Trace, noted on ECHO  . Arthritis   . CAD in native artery    Nuclear stress test 10/19: EF 58, normal perfusion, low risk  . Cellulitis   . Dermatitis   . Diabetes mellitus    type II  . Diastolic dysfunction 16/05/9603   Mild, noted on ECHO  . Diverticulosis 01/31/2016   ASCENDING COLON AND CECUM, NOTED ON COLONOSCOPY  . DOE (dyspnea on exertion)   . Edema   . Fatty liver 09/18/2004   Noted on [place] ABD  . History of kidney stones 05/23/2002   Noted on CT Abd  . Hx of colonic polyps 01/31/2016  . Hyperlipidemia   . Hypertension   . Internal hemorrhoids 01/31/2016   Noted on colonoscopy  . Obesity   . OSA (obstructive sleep apnea)    cpap  . Positional vertigo   . Pulmonary hypertension ([place]) 01/11/2018   Mild, noted on ECHO  . Rhinosinusitis   . Skin lesion   . TR (tricuspid regurgitation) 01/11/2018   Trace, noted on ECHO    Past Surgical History:  Past Surgical History:  Procedure Laterality Date  . CARDIAC CATHETERIZATION    . COLONOSCOPY    . CORONARY STENT  PLACEMENT     3 stents /1997  . POLYPECTOMY    . TONSILLECTOMY    . TOTAL KNEE ARTHROPLASTY Right 10/04/2018   Procedure: RIGHT TOTAL KNEE ARTHROPLASTY;  Surgeon: Gaynelle Arabian, MD;  Location: [facility];  Service: Orthopedics;  Laterality: Right;  Adductor Block    Medication: Current Facility-Administered Medications  Medication Dose Route Frequency Provider Last Rate Last Dose  . 0.9 %  sodium chloride infusion   Intravenous Continuous Gaynelle Arabian, MD   Stopped at 10/05/18 1155  . albuterol (PROVENTIL) (2.5 MG/3ML) 0.083% nebulizer solution 3 mL  3 mL Inhalation Q6H PRN Aluisio, Pilar Plate, MD      . amLODipine (NORVASC) tablet 2.5 mg  2.5 mg Oral Daily Gaynelle Arabian, MD   2.5 mg at 10/10/18 1123  . aspirin EC tablet 325 mg  325 mg Oral BID Gaynelle Arabian, MD   325 mg at 10/10/18 1125  . bisacodyl (DULCOLAX) suppository 10 mg  10 mg Rectal Daily PRN Gaynelle Arabian, MD   10 mg at 10/07/18 1229  . dexamethasone (DECADRON) injection 10 mg  10 mg Intravenous Once Gaynelle Arabian, MD      . diphenhydrAMINE (BENADRYL) 12.5 MG/5ML elixir 12.5-25 mg  12.5-25 mg Oral Q4H PRN Aluisio, Pilar Plate, MD      . docusate sodium (COLACE) capsule 100 mg  100 mg Oral BID Gaynelle Arabian, MD   100 mg at 10/10/18 1125  .  fesoterodine (TOVIAZ) tablet 8 mg  8 mg Oral Daily Gaynelle Arabian, MD   8 mg at 10/10/18 1125  . fluticasone (FLONASE) 50 MCG/ACT nasal spray 2 spray  2 spray Each Nare Daily PRN Aluisio, Pilar Plate, MD      . gabapentin (NEURONTIN) capsule 300 mg  300 mg Oral TID Gaynelle Arabian, MD   300 mg at 10/10/18 1736  . insulin aspart (novoLOG) injection 0-15 Units  0-15 Units Subcutaneous TID WC Gaynelle Arabian, MD   3 Units at 10/10/18 1735  . insulin glargine (LANTUS) injection 40 Units  40 Units Subcutaneous Daily Gaynelle Arabian, MD   40 Units at 10/10/18 [phone]  . liraglutide (VICTOZA) SOPN 1.2 mg  1.2 mg Subcutaneous Daily Aluisio, Pilar Plate, MD   1.2 mg at 10/10/18 1000  . meclizine (ANTIVERT) tablet 12.5 mg  12.5 mg  Oral TID PRN Gaynelle Arabian, MD      . menthol-cetylpyridinium (CEPACOL) lozenge 3 mg  1 lozenge Oral PRN Gaynelle Arabian, MD       Or  . phenol (CHLORASEPTIC) mouth spray 1 spray  1 spray Mouth/Throat PRN Aluisio, Pilar Plate, MD      . methocarbamol (ROBAXIN) tablet 500 mg  500 mg Oral Q6H PRN Gaynelle Arabian, MD   500 mg at 10/08/18 0842   Or  . methocarbamol (ROBAXIN) 500 mg in dextrose 5 % 50 mL IVPB  500 mg Intravenous Q6H PRN Gaynelle Arabian, MD 100 mL/hr at 10/04/18 1411 500 mg at 10/04/18 1411  . metoCLOPramide (REGLAN) tablet 5-10 mg  5-10 mg Oral Q8H PRN Aluisio, Pilar Plate, MD       Or  . metoCLOPramide (REGLAN) injection 5-10 mg  5-10 mg Intravenous Q8H PRN Aluisio, Pilar Plate, MD      . morphine 2 MG/ML injection 1-2 mg  1-2 mg Intravenous Q2H PRN Gaynelle Arabian, MD   1 mg at 10/05/18 1407  . ondansetron (ZOFRAN) tablet 4 mg  4 mg Oral Q6H PRN Aluisio, Pilar Plate, MD       Or  . ondansetron (ZOFRAN) injection 4 mg  4 mg Intravenous Q6H PRN Gaynelle Arabian, MD   4 mg at 10/04/18 1703  . polyethylene glycol (MIRALAX / GLYCOLAX) packet 17 g  17 g Oral Daily PRN Gaynelle Arabian, MD   17 g at 10/07/18 1132  . simvastatin (ZOCOR) tablet 20 mg  20 mg Oral QHS Gaynelle Arabian, MD   20 mg at 10/09/18 2126  . tamsulosin (FLOMAX) capsule 0.4 mg  0.4 mg Oral Daily Gaynelle Arabian, MD   0.4 mg at 10/10/18 1126  . traMADol (ULTRAM) tablet 50-100 mg  50-100 mg Oral Q6H PRN Gaynelle Arabian, MD   100 mg at 10/10/18 1740    Allergies: Allergies  Allergen Reactions  . Lipitor [Atorvastatin Calcium] Other (See Comments)    Muscle soreness  . Oxycodone Anxiety    Causes patient to become shaky and dizzy. Unable to tolerate.    Social History: Social History   Tobacco Use  . Smoking status: Former Smoker    Packs/day: 1.00    Years: 20.00    Pack years: 20.00    Types: Cigarettes    Last attempt to quit: 01/10/1996    Years since quitting: 22.7  . Smokeless tobacco: Never Used  Substance Use Topics  . Alcohol  use: No  . Drug use: No    Family History Family History  Problem Relation Age of Onset  . Cancer Mother        ? lung  cancer  . Heart disease Father   . Heart attack Father   . Heart disease Sister   . Diabetes Sister   . Heart attack Sister   . Heart attack Brother     Review of Systems 10 systems were reviewed and are negative except as noted specifically in the HPI.  Objective   Vital signs in last 24 hours: BP 130/67 (BP Location: Right Arm)   Pulse 81   Temp 98.1 F (36.7 C) (Oral)   Resp 16   Ht 5\' 11"  (1.803 m)   Wt 129.3 kg   SpO2 90%   BMI 39.76 kg/m   Physical Exam General: NAD, A&O, resting, appropriate HEENT: [place]/AT, EOMI, MMM Pulmonary: Normal work of breathing Cardiovascular: HDS, adequate peripheral perfusion Abdomen: Soft, NTTP, nondistended GU:  3-way Foley catheter draining clear yellow urine. Irrigation port plugged. Extremities: warm and well perfused Neuro: Appropriate, no focal neurological deficits  Most Recent Labs: Lab Results  Component Value Date   WBC 5.6 10/07/2018   HGB 10.5 (L) 10/07/2018   HCT 31.7 (L) 10/07/2018   PLT 77 (L) 10/07/2018    Lab Results  Component Value Date   NA 137 10/06/2018   K 4.0 10/06/2018   CL 102 10/06/2018   CO2 27 10/06/2018   BUN 21 10/06/2018   CREATININE 0.89 10/06/2018   CALCIUM 8.3 (L) 10/06/2018   MG 2.0 05/24/2018    Lab Results  Component Value Date   INR 1.08 09/27/2018   APTT 29 09/27/2018     IMAGING: No results found.  ------  Assessment:  78 y.o. male with a history of nephrolithiasis, BPH/LUTS, now with post-op urinary retention s/p right total knee 2/10.  Catheter noted to be non-draining with GH/clot, possibly dislodged and later removed by nursing this afternoon. Patient unable to void volitionally. Nursing requesting urology assistance for replacement. 22Fr 3-way Foley catheter placed with return of clear yellow urine, ~500 mL return. 20 mL sterile water in  catheter balloon. Irrigation port plugged.  Suspect bleeding was secondary to catheter dislodgement/urethral trauma, now resolved. Post-operative urinary retention is often multifactorial in nature, and can be secondary to ongoing post-operative pain, excessive narcotics, medications with anticholinergic properties, bowel dysfunction, and frequently non-ambulatory status.    Recommendations: - Continue Foley catheter to drainage, nursing may flush PRN - Please limit anticholinergic and narcotic medications as these can cause decreased detrusor function - Encourage ambulation as well as attempt at normal voiding habits (decrease recumbent bedpan or bedside urinal use if possible) - Continue Flomax - 1 week outpatient urology follow up has been requested for TOV - Please contact urology with additional questions or concerns. We will sign off at this time.

## 2018-10-10 NOTE — NC FL2 (Signed)
[place] LEVEL OF CARE SCREENING TOOL     IDENTIFICATION  Patient Name: Jacob Bishop Birthdate: 1940/10/05 Sex: male Admission Date (Current Location): 10/04/2018  [facility] and [place] Number:  Herbalist and Address:  [facility],  [place] [address] [place]      Provider Number: [id number]  Attending Physician Name and Address:  Gaynelle Arabian, MD  Relative Name and Phone Number:  Aking Klabunde: [phone]    Current Level of Care: Hospital Recommended Level of Care: [place] Prior Approval Number:    Date Approved/Denied:   PASRR Number: [id number]  Discharge Plan: SNF    Current Diagnoses: Patient Active Problem List   Diagnosis Date Noted  . OA (osteoarthritis) of knee 10/04/2018  . Other secondary pulmonary hypertension ([place]) 04/06/2018  . Osteoarthritis, hand 05/05/2014  . Carpal tunnel syndrome 05/02/2014  . Eczema 01/23/2014  . Thrombocytopenia, unspecified ([place]) 11/19/2012  . Overactive bladder 08/24/2012  . Osteoarthritis of right knee 01/22/2012  . Benign positional vertigo 07/09/2011  . CAD, NATIVE VESSEL 10/22/2009  . EDEMA 08/03/2009  . Obstructive sleep apnea 05/03/2009  . SKIN LESION 09/23/2007  . Diabetes mellitus type 2, uncontrolled ([place]) 03/23/2007  . Hyperlipidemia 03/23/2007  . Essential hypertension 03/23/2007  . COLONIC POLYPS, HX OF 03/23/2007  . Morbid obesity ([place]) 03/23/2007    Orientation RESPIRATION BLADDER Height & Weight     Self, Time, Situation, Place  Normal Incontinent, Indwelling catheter Weight: 285 lb 0.9 oz (129.3 kg) Height:  5\' 11"  (180.3 cm)  BEHAVIORAL SYMPTOMS/MOOD NEUROLOGICAL BOWEL NUTRITION STATUS      Continent Diet  AMBULATORY STATUS COMMUNICATION OF NEEDS Skin   Extensive Assist Verbally Surgical wounds, Other (Comment)(R knee surgical incision, non-pressure wound: anus medial (split in butt crack))                       Personal Care  Assistance Level of Assistance  Bathing, Dressing, Feeding Bathing Assistance: Limited assistance Feeding assistance: Independent Dressing Assistance: Limited assistance     Functional Limitations Info  Sight, Hearing, Speech Sight Info: Adequate Hearing Info: Adequate Speech Info: Adequate    SPECIAL CARE FACTORS FREQUENCY  PT (By licensed PT), OT (By licensed OT)     PT Frequency: 5x/week OT Frequency: 5x/week            Contractures Contractures Info: Not present    Additional Factors Info  Code Status, Allergies Code Status Info: Full Allergies Info: LIPITOR ATORVASTATIN CALCIUM, OXYCODONE            Current Medications (10/10/2018):  This is the current hospital active medication list Current Facility-Administered Medications  Medication Dose Route Frequency Provider Last Rate Last Dose  . 0.9 %  sodium chloride infusion   Intravenous Continuous Gaynelle Arabian, MD   Stopped at 10/05/18 1155  . albuterol (PROVENTIL) (2.5 MG/3ML) 0.083% nebulizer solution 3 mL  3 mL Inhalation Q6H PRN Aluisio, Pilar Plate, MD      . amLODipine (NORVASC) tablet 2.5 mg  2.5 mg Oral Daily Gaynelle Arabian, MD   2.5 mg at 10/10/18 1123  . aspirin EC tablet 325 mg  325 mg Oral BID Gaynelle Arabian, MD   325 mg at 10/10/18 1125  . bisacodyl (DULCOLAX) suppository 10 mg  10 mg Rectal Daily PRN Gaynelle Arabian, MD   10 mg at 10/07/18 1229  . dexamethasone (DECADRON) injection 10 mg  10 mg Intravenous Once Gaynelle Arabian, MD      .  diphenhydrAMINE (BENADRYL) 12.5 MG/5ML elixir 12.5-25 mg  12.5-25 mg Oral Q4H PRN Aluisio, Pilar Plate, MD      . docusate sodium (COLACE) capsule 100 mg  100 mg Oral BID Gaynelle Arabian, MD   100 mg at 10/10/18 1125  . fesoterodine (TOVIAZ) tablet 8 mg  8 mg Oral Daily Gaynelle Arabian, MD   8 mg at 10/10/18 1125  . fluticasone (FLONASE) 50 MCG/ACT nasal spray 2 spray  2 spray Each Nare Daily PRN Aluisio, Pilar Plate, MD      . gabapentin (NEURONTIN) capsule 300 mg  300 mg Oral TID Gaynelle Arabian, MD   300 mg at 10/10/18 1126  . insulin aspart (novoLOG) injection 0-15 Units  0-15 Units Subcutaneous TID WC Gaynelle Arabian, MD   5 Units at 10/10/18 1353  . insulin glargine (LANTUS) injection 40 Units  40 Units Subcutaneous Daily Gaynelle Arabian, MD   40 Units at 10/10/18 [phone]  . liraglutide (VICTOZA) SOPN 1.2 mg  1.2 mg Subcutaneous Daily Aluisio, Pilar Plate, MD   1.2 mg at 10/10/18 1000  . meclizine (ANTIVERT) tablet 12.5 mg  12.5 mg Oral TID PRN Gaynelle Arabian, MD      . menthol-cetylpyridinium (CEPACOL) lozenge 3 mg  1 lozenge Oral PRN Gaynelle Arabian, MD       Or  . phenol (CHLORASEPTIC) mouth spray 1 spray  1 spray Mouth/Throat PRN Aluisio, Pilar Plate, MD      . methocarbamol (ROBAXIN) tablet 500 mg  500 mg Oral Q6H PRN Gaynelle Arabian, MD   500 mg at 10/08/18 0842   Or  . methocarbamol (ROBAXIN) 500 mg in dextrose 5 % 50 mL IVPB  500 mg Intravenous Q6H PRN Gaynelle Arabian, MD 100 mL/hr at 10/04/18 1411 500 mg at 10/04/18 1411  . metoCLOPramide (REGLAN) tablet 5-10 mg  5-10 mg Oral Q8H PRN Aluisio, Pilar Plate, MD       Or  . metoCLOPramide (REGLAN) injection 5-10 mg  5-10 mg Intravenous Q8H PRN Aluisio, Pilar Plate, MD      . morphine 2 MG/ML injection 1-2 mg  1-2 mg Intravenous Q2H PRN Gaynelle Arabian, MD   1 mg at 10/05/18 1407  . ondansetron (ZOFRAN) tablet 4 mg  4 mg Oral Q6H PRN Aluisio, Pilar Plate, MD       Or  . ondansetron (ZOFRAN) injection 4 mg  4 mg Intravenous Q6H PRN Gaynelle Arabian, MD   4 mg at 10/04/18 1703  . polyethylene glycol (MIRALAX / GLYCOLAX) packet 17 g  17 g Oral Daily PRN Gaynelle Arabian, MD   17 g at 10/07/18 1132  . simvastatin (ZOCOR) tablet 20 mg  20 mg Oral QHS Gaynelle Arabian, MD   20 mg at 10/09/18 2126  . tamsulosin (FLOMAX) capsule 0.4 mg  0.4 mg Oral Daily Gaynelle Arabian, MD   0.4 mg at 10/10/18 1126  . traMADol (ULTRAM) tablet 50-100 mg  50-100 mg Oral Q6H PRN Gaynelle Arabian, MD   50 mg at 10/09/18 1716     Discharge Medications: Please see discharge summary for a list  of discharge medications.  Relevant Imaging Results:  Relevant Lab Results:   Additional Information SSN: [id number]  Pricilla Holm, LCSWA

## 2018-10-10 NOTE — Progress Notes (Signed)
Matt Babish notified of pt with foley complications. Pt reported feeling like his bladder was full. Bladder scan showed 309 with foley cath in place. Pt cath removed due to resistance of flushing and inability to inflate foley bubble without pt hurting. Matt Babish to call urology. Visible blood on bed coming from penis.

## 2018-10-10 NOTE — Progress Notes (Signed)
Pt complaining of bladder pain. Bladder scan shows slightly over 381ml in bladder. Foley removed by another RN and this nurse notified of findings. Spoke with Dr. Clydene Laming from Urology and reported findings. Dr Clydene Laming arrived to floor to assess pt

## 2018-10-10 NOTE — Progress Notes (Signed)
Pt complaining of pain to penis and bladder area, bladder scan shows 375mL with foley catheter in place. Urine in foley bag amber/bloody. This RN flushed foley with 20mL saline and deflated balloon and retracted back approximately 76mm, RN did not re-inflate balloon due to blood in catheter, after consult with second RN, foley discontinued and PA notified.

## 2018-10-10 NOTE — Progress Notes (Signed)
Patient was seen by Dr. Clydene Laming earlier tonight due to inability to void and bleeding around foley cath which was eventually removed because RN was unable to irrigate same. A size 89F catheter with 30cc balloon was placed by Dr. Clydene Laming. Approximately 500cc dark amber urine returned. Pt stated his abdomen felt much better after catheter insertion. Will monitor urinary output and irrigate catheter prn.

## 2018-10-10 NOTE — Clinical Social Work Note (Signed)
Clinical Social Work Assessment  Patient Details  Name: Jacob Bishop MRN: [id number] Date of Birth: 06-17-1941  Date of referral:  10/08/18               Reason for consult:  Facility Placement                Permission sought to share information with:  Facility Art therapist granted to share information::  Yes, Verbal Permission Granted  Name::     [place]::  SNF  Relationship::  Spouse  Contact Information:  [phone]  Housing/Transportation Living arrangements for the past 2 months:  [place] of Information:  Patient, Spouse Patient Interpreter Needed:  None Criminal Activity/Legal Involvement Pertinent to Current Situation/Hospitalization:  No - Comment as needed Significant Relationships:  Spouse, Other Family Members Lives with:  Spouse Do you feel safe going back to the place where you live?  Yes Need for family participation in patient care:  No (Coment)  Care giving concerns:  Patient admitted for R TKR. Patient lives at home with spouse with supportive family nearby. PT recommending SNF placement at discharge for short term therapy before returning home.   Social Worker assessment / plan:  CSW met with patient, spouse, and family members at bedside to discuss discharge plan and SNF referral process. Patient and spouse reported that they have strong preference for SNF due to patient's current need for +2 assist. Spouse reports she would be unable to safely care for patient at home and wants him to get stronger before returning home.  Patient and spouse are open to SNF referrals. They do not report a preference of facilities.   CSW explained process and will complete FL2. Family understands CSW will f/u with bed choices and explained Medicare coverage.  Employment status:  Retired Forensic scientist:  Medicare PT Recommendations:  [place] / Referral to community resources:  [place]  Patient/Family's Response to care:  Patient and family were appreciative of CSW involvement and explanation of SNF process. They are in agreeance with SNF recommendation.  Patient/Family's Understanding of and Emotional Response to Diagnosis, Current Treatment, and Prognosis:  Patient and family understand patient's current needs and benefits of SNF level therapy.  Emotional Assessment Appearance:  Appears older than stated age Attitude/Demeanor/Rapport:  Engaged Affect (typically observed):  Accepting, Adaptable, Pleasant, Appropriate Orientation:  Oriented to Self, Oriented to Place, Oriented to  Time, Oriented to Situation Alcohol / Substance use:  Not Applicable Psych involvement (Current and /or in the community):  No (Comment)  Discharge Needs  Concerns to be addressed:  Care Coordination Readmission within the last 30 days:  No Current discharge risk:  Physical Impairment Barriers to Discharge:  Continued Medical Work up   [employer], [place] 10/10/2018, 3:39 PM

## 2018-10-10 NOTE — Progress Notes (Signed)
     Subjective: 6 Days Post-Op Procedure(s) (LRB): RIGHT TOTAL KNEE ARTHROPLASTY (Right)   Patient reports pain as mild, pain controlled. No events throughout the night. Complaining of weakness in the UEs, which he feels is limiting his progress to be able to go home. States that his wife wouldn't be able to care for him at home.      Objective:   VITALS:   Vitals:   10/09/18 2125 10/10/18 0521  BP: 136/66 130/67  Pulse: 97 81  Resp: 20 16  Temp: 98.1 F (36.7 C) 98.1 F (36.7 C)  SpO2: (!) 89% 90%    Dorsiflexion/Plantar flexion intact Incision: dressing C/D/I No cellulitis present Compartment soft  LABS No results for input(s): HGB, HCT, WBC, PLT in the last 72 hours.  No results for input(s): NA, K, BUN, CREATININE, GLUCOSE in the last 72 hours.   Assessment/Plan: 6 Days Post-Op Procedure(s) (LRB): RIGHT TOTAL KNEE ARTHROPLASTY (Right)  Up with therapy Discharge disposition TBD     [place]. Shericka Johnstone   PAC  10/10/2018, 8:00 AM

## 2018-10-10 NOTE — Progress Notes (Signed)
Pt refused cpap tonight.  Pt was notified that RT is available all night should he change his mind.  RN aware.  Machine remains in room on standby if needed.

## 2018-10-10 NOTE — Plan of Care (Signed)
  Problem: Safety: Goal: Ability to remain free from injury will improve Outcome: Progressing   Problem: Pain Managment: Goal: General experience of comfort will improve Outcome: Progressing   Problem: Elimination: Goal: Will not experience complications related to urinary retention Outcome: Progressing   Problem: Coping: Goal: Level of anxiety will decrease Outcome: Progressing   Problem: Activity: Goal: Risk for activity intolerance will decrease Outcome: Progressing

## 2018-10-10 NOTE — Progress Notes (Addendum)
Physical Therapy Treatment Patient Details Name: Jacob Bishop MRN: [id number] DOB: 1941/04/10 Today's Date: 10/10/2018    History of Present Illness 78 yo male s/p R TKR on 10/04/18. PMH includes OA, carpal tunnel, BPPV, CAD with coronary stent placement 1997, OSA, DMII, HTN, HLD, obesity, CHF.     PT Comments    Pt c/o arm fatigue with gait for 30'.  He continues to need +2 assistance and recommend SNF at this time.  He did have a reddened area with skin breakdown at top of gluteal fold.  Nursing informed.   Follow Up Recommendations  SNF     Equipment Recommendations  Rolling walker with 5" wheels    Recommendations for Other Services       Precautions / Restrictions Precautions Precautions: Fall Required Braces or Orthoses: Knee Immobilizer - Right Knee Immobilizer - Right: On when out of bed or walking;Discontinue once straight leg raise with < 10 degree lag Restrictions Weight Bearing Restrictions: No    Mobility  Bed Mobility Overal bed mobility: Needs Assistance Bed Mobility: Supine to Sit     Supine to sit: Max assist     General bed mobility comments: MAX A for getting upper body upright.  Transfers Overall transfer level: Needs assistance Equipment used: Rolling walker (2 wheeled) Transfers: Sit to/from [facility] Sit to Stand: +2 safety/equipment;From elevated surface;Mod assist Stand pivot transfers: Mod assist;+2 safety/equipment       General transfer comment: Stood from elevated bed with +2 A.  Pt had to have BM urgently and didn't make it to [facility].    Ambulation/Gait Ambulation/Gait assistance: Mod assist;+2 physical assistance;+2 safety/equipment Gait Distance (Feet): 30 Feet Assistive device: Rolling walker (2 wheeled) Gait Pattern/deviations: Step-to pattern;Decreased stance time - right;Decreased weight shift to right;Antalgic;Trunk flexed Gait velocity: decreased    General Gait Details: He required frequent cueing for  correct sequence and for posture.  He c/o arm fatigue.   Stairs             Wheelchair Mobility    Modified Rankin (Stroke Patients Only)       Balance Overall balance assessment: Needs assistance Sitting-balance support: No upper extremity supported Sitting balance-Leahy Scale: Good     Standing balance support: Bilateral upper extremity supported Standing balance-Leahy Scale: Poor                              Cognition Arousal/Alertness: Awake/alert Behavior During Therapy: WFL for tasks assessed/performed Overall Cognitive Status: Within Functional Limits for tasks assessed                                        Exercises Total Joint Exercises Ankle Circles/Pumps: AROM;Both;10 reps;Supine Quad Sets: 5 reps;Supine Heel Slides: AROM;Right;10 reps    General Comments        Pertinent Vitals/Pain Pain Assessment: Faces Faces Pain Scale: Hurts a little bit Pain Location: R knee  Pain Descriptors / Indicators: Sore;Tender Pain Intervention(s): Monitored during session;Limited activity within patient's tolerance    Home Living                      Prior Function            PT Goals (current goals can now be found in the care plan section) Acute Rehab PT Goals PT Goal Formulation: With patient  Time For Goal Achievement: 10/17/18 Potential to Achieve Goals: Good Progress towards PT goals: Progressing toward goals    Frequency    7X/week      PT Plan Current plan remains appropriate    Co-evaluation              AM-PAC PT "6 Clicks" Mobility   Outcome Measure  Help needed turning from your back to your side while in a flat bed without using bedrails?: A Lot Help needed moving from lying on your back to sitting on the side of a flat bed without using bedrails?: A Lot Help needed moving to and from a bed to a chair (including a wheelchair)?: A Lot Help needed standing up from a chair using your arms  (e.g., wheelchair or bedside chair)?: A Lot Help needed to walk in hospital room?: A Lot Help needed climbing 3-5 steps with a railing? : A Lot 6 Click Score: 12    End of Session Equipment Utilized During Treatment: Gait belt Activity Tolerance: Patient limited by fatigue Patient left: in chair;with call bell/phone within reach Nurse Communication: Mobility status PT Visit Diagnosis: Other abnormalities of gait and mobility (R26.89);Difficulty in walking, not elsewhere classified (R26.2);Muscle weakness (generalized) (M62.81)     Time: 4469-5072 PT Time Calculation (min) (ACUTE ONLY): 21 min  Charges:  $Gait Training: 8-22 mins                     Jacob Bishop, [place] Pager [phone] 10/10/2018    Jacob Bishop 10/10/2018, 12:22 PM

## 2018-10-11 DIAGNOSIS — R338 Other retention of urine: Secondary | ICD-10-CM | POA: Diagnosis not present

## 2018-10-11 DIAGNOSIS — Z96651 Presence of right artificial knee joint: Secondary | ICD-10-CM | POA: Diagnosis not present

## 2018-10-11 DIAGNOSIS — N3281 Overactive bladder: Secondary | ICD-10-CM | POA: Diagnosis not present

## 2018-10-11 DIAGNOSIS — E785 Hyperlipidemia, unspecified: Secondary | ICD-10-CM | POA: Diagnosis not present

## 2018-10-11 DIAGNOSIS — L309 Dermatitis, unspecified: Secondary | ICD-10-CM | POA: Diagnosis not present

## 2018-10-11 DIAGNOSIS — R0602 Shortness of breath: Secondary | ICD-10-CM | POA: Diagnosis not present

## 2018-10-11 DIAGNOSIS — R062 Wheezing: Secondary | ICD-10-CM | POA: Diagnosis not present

## 2018-10-11 DIAGNOSIS — E119 Type 2 diabetes mellitus without complications: Secondary | ICD-10-CM | POA: Diagnosis not present

## 2018-10-11 DIAGNOSIS — I2510 Atherosclerotic heart disease of native coronary artery without angina pectoris: Secondary | ICD-10-CM | POA: Diagnosis not present

## 2018-10-11 DIAGNOSIS — N400 Enlarged prostate without lower urinary tract symptoms: Secondary | ICD-10-CM | POA: Diagnosis not present

## 2018-10-11 DIAGNOSIS — I351 Nonrheumatic aortic (valve) insufficiency: Secondary | ICD-10-CM | POA: Diagnosis not present

## 2018-10-11 DIAGNOSIS — Z471 Aftercare following joint replacement surgery: Secondary | ICD-10-CM | POA: Diagnosis not present

## 2018-10-11 DIAGNOSIS — R42 Dizziness and giddiness: Secondary | ICD-10-CM | POA: Diagnosis not present

## 2018-10-11 DIAGNOSIS — K760 Fatty (change of) liver, not elsewhere classified: Secondary | ICD-10-CM | POA: Diagnosis not present

## 2018-10-11 DIAGNOSIS — M2550 Pain in unspecified joint: Secondary | ICD-10-CM | POA: Diagnosis not present

## 2018-10-11 DIAGNOSIS — Z7401 Bed confinement status: Secondary | ICD-10-CM | POA: Diagnosis not present

## 2018-10-11 DIAGNOSIS — M62838 Other muscle spasm: Secondary | ICD-10-CM | POA: Diagnosis not present

## 2018-10-11 DIAGNOSIS — K5900 Constipation, unspecified: Secondary | ICD-10-CM | POA: Diagnosis not present

## 2018-10-11 DIAGNOSIS — D696 Thrombocytopenia, unspecified: Secondary | ICD-10-CM | POA: Diagnosis not present

## 2018-10-11 DIAGNOSIS — L0390 Cellulitis, unspecified: Secondary | ICD-10-CM | POA: Diagnosis not present

## 2018-10-11 DIAGNOSIS — Z4789 Encounter for other orthopedic aftercare: Secondary | ICD-10-CM | POA: Diagnosis not present

## 2018-10-11 DIAGNOSIS — R278 Other lack of coordination: Secondary | ICD-10-CM | POA: Diagnosis not present

## 2018-10-11 DIAGNOSIS — E1165 Type 2 diabetes mellitus with hyperglycemia: Secondary | ICD-10-CM | POA: Diagnosis not present

## 2018-10-11 DIAGNOSIS — I272 Pulmonary hypertension, unspecified: Secondary | ICD-10-CM | POA: Diagnosis not present

## 2018-10-11 DIAGNOSIS — I10 Essential (primary) hypertension: Secondary | ICD-10-CM | POA: Diagnosis not present

## 2018-10-11 DIAGNOSIS — M25561 Pain in right knee: Secondary | ICD-10-CM | POA: Diagnosis not present

## 2018-10-11 DIAGNOSIS — Z96659 Presence of unspecified artificial knee joint: Secondary | ICD-10-CM | POA: Diagnosis not present

## 2018-10-11 DIAGNOSIS — M1711 Unilateral primary osteoarthritis, right knee: Secondary | ICD-10-CM | POA: Diagnosis not present

## 2018-10-11 DIAGNOSIS — Z7982 Long term (current) use of aspirin: Secondary | ICD-10-CM | POA: Diagnosis not present

## 2018-10-11 DIAGNOSIS — G4733 Obstructive sleep apnea (adult) (pediatric): Secondary | ICD-10-CM | POA: Diagnosis not present

## 2018-10-11 LAB — GLUCOSE, CAPILLARY
Glucose-Capillary: 160 mg/dL — ABNORMAL HIGH (ref 70–99)
Glucose-Capillary: 166 mg/dL — ABNORMAL HIGH (ref 70–99)

## 2018-10-11 MED ORDER — TRAMADOL HCL 50 MG PO TABS: 50.0000 mg | ORAL_TABLET | Freq: Four times a day (QID) | ORAL | 0 refills | Status: DC | PRN

## 2018-10-11 MED ORDER — ASPIRIN 325 MG PO TBEC: 325.0000 mg | DELAYED_RELEASE_TABLET | Freq: Two times a day (BID) | ORAL | 0 refills | Status: AC

## 2018-10-11 MED ORDER — GABAPENTIN 300 MG PO CAPS: 300.0000 mg | ORAL_CAPSULE | Freq: Three times a day (TID) | ORAL | 0 refills | Status: DC

## 2018-10-11 MED ORDER — METHOCARBAMOL 500 MG PO TABS: 500.0000 mg | ORAL_TABLET | Freq: Four times a day (QID) | ORAL | 0 refills | Status: DC | PRN

## 2018-10-11 NOTE — Clinical Social Work Placement (Addendum)
Patient medically ready to transfer today. CSW provided SNF offers, patient has requested to discuss with his spouse.  Patient d/c after choice has been made.  PTAR to transport/  Update: Patient agreeable to SNF-[facility] PTAR to transport. 2:30pick up Per nurse, spouse has arrived at bedside.    CLINICAL SOCIAL WORK PLACEMENT  NOTE  Date:  10/11/2018  Patient Details  Name: Jacob Bishop MRN: [id number] Date of Birth: [birth year]  Clinical Social Work is seeking post-discharge placement for this patient at the [place] level of care (*CSW will initial, date and re-position this form in  chart as items are completed):  Yes   Patient/family provided with [facility] Work Department's list of facilities offering this level of care within the geographic area requested by the patient (or if unable, by the patient's family).  Yes   Patient/family informed of their freedom to choose among providers that offer the needed level of care, that participate in Medicare, Medicaid or managed care program needed by the patient, have an available bed and are willing to accept the patient.  Yes   Patient/family informed of [place]'s ownership interest in [facility] and [facility], as well as of the fact that they are under no obligation to receive care at these facilities.  PASRR submitted to EDS on 10/10/18     PASRR number received on       Existing PASRR number confirmed on       FL2 transmitted to all facilities in geographic area requested by pt/family on       FL2 transmitted to all facilities within larger geographic area on 10/11/18     Patient informed that his/her managed care company has contracts with or will negotiate with certain facilities, including the following:            Patient/family informed of bed offers received.  Patient chooses bed at    [facility]   Physician recommends and patient chooses bed at   [facility]    Patient  to be transferred to   on 10/11/18.  Patient to be transferred to facility by    PTAR    Patient family notified on 10/11/18 of transfer.  Name of family member notified: Patient to notify his Spouse     PHYSICIAN       Additional Comment:    _______________________________________________ Lia Hopping, LCSW 10/11/2018, 9:46 AM

## 2018-10-11 NOTE — Discharge Summary (Signed)
Physician Discharge Summary   Patient ID: Jacob Bishop MRN: [id number] DOB/AGE: 1940-12-26 78 y.o.  Admit date: 10/04/2018 Discharge date: 10/11/2018  Primary Diagnosis: Osteoarthritis, right knee   Admission Diagnoses:  Past Medical History:  Diagnosis Date  . AI (aortic insufficiency) 01/11/2018   Trace, noted on ECHO  . Arthritis   . CAD in native artery    Nuclear stress test 10/19: EF 58, normal perfusion, low risk  . Cellulitis   . Dermatitis   . Diabetes mellitus    type II  . Diastolic dysfunction 22/09/5425   Mild, noted on ECHO  . Diverticulosis 01/31/2016   ASCENDING COLON AND CECUM, NOTED ON COLONOSCOPY  . DOE (dyspnea on exertion)   . Edema   . Fatty liver 09/18/2004   Noted on [place] ABD  . History of kidney stones 05/23/2002   Noted on CT Abd  . Hx of colonic polyps 01/31/2016  . Hyperlipidemia   . Hypertension   . Internal hemorrhoids 01/31/2016   Noted on colonoscopy  . Obesity   . OSA (obstructive sleep apnea)    cpap  . Positional vertigo   . Pulmonary hypertension ([place]) 01/11/2018   Mild, noted on ECHO  . Rhinosinusitis   . Skin lesion   . TR (tricuspid regurgitation) 01/11/2018   Trace, noted on ECHO   Discharge Diagnoses:   Principal Problem:   Osteoarthritis of right knee Active Problems:   OA (osteoarthritis) of knee  Estimated body mass index is 39.76 kg/m as calculated from the following:   Height as of this encounter: 5\' 11"  (1.803 m).   Weight as of this encounter: 129.3 kg.  Procedure:  Procedure(s) (LRB): RIGHT TOTAL KNEE ARTHROPLASTY (Right)   Consults: urology  HPI: Jacob Bishop is a 78 y.o. year old male with end stage OA of his right knee with progressively worsening pain and dysfunction. He has constant pain, with activity and at rest and significant functional deficits with difficulties even with ADLs. He has had extensive non-op management including analgesics, injections of cortisone, and home exercise program, but  remains in significant pain with significant dysfunction. Radiographs show bone on bone arthritis medial and patellofemoral. He presents now for right Total Knee Arthroplasty.    Laboratory Data: Admission on 10/04/2018  Component Date Value Ref Range Status  . WBC 10/04/2018 6.3  4.0 - 10.5 K/uL Final  . RBC 10/04/2018 4.17* 4.22 - 5.81 MIL/uL Final  . Hemoglobin 10/04/2018 13.5  13.0 - 17.0 g/dL Final  . HCT 10/04/2018 40.4  39.0 - 52.0 % Final  . MCV 10/04/2018 96.9  80.0 - 100.0 fL Final  . MCH 10/04/2018 32.4  26.0 - 34.0 pg Final  . MCHC 10/04/2018 33.4  30.0 - 36.0 g/dL Final  . RDW 10/04/2018 13.1  11.5 - 15.5 % Final  . Platelets 10/04/2018 85* 150 - 400 K/uL Final   Comment: REPEATED TO VERIFY PLATELET COUNT CONFIRMED BY SMEAR SPECIMEN CHECKED FOR CLOTS Immature Platelet Fraction may be clinically indicated, consider ordering this additional test [id number]   . nRBC 10/04/2018 0.0  0.0 - 0.2 % Final   Performed at [facility] [address], [place], [place] [address]  . Glucose-Capillary 10/04/2018 117* 70 - 99 mg/dL Final  . Glucose-Capillary 10/04/2018 114* 70 - 99 mg/dL Final  . Comment 1 10/04/2018 Notify RN   Final  . Comment 2 10/04/2018 Document in Chart   Final  . Glucose-Capillary 10/04/2018 191* 70 - 99 mg/dL Final  .  WBC 10/05/2018 7.7  4.0 - 10.5 K/uL Final  . RBC 10/05/2018 3.79* 4.22 - 5.81 MIL/uL Final  . Hemoglobin 10/05/2018 12.2* 13.0 - 17.0 g/dL Final  . HCT 10/05/2018 36.9* 39.0 - 52.0 % Final  . MCV 10/05/2018 97.4  80.0 - 100.0 fL Final  . MCH 10/05/2018 32.2  26.0 - 34.0 pg Final  . MCHC 10/05/2018 33.1  30.0 - 36.0 g/dL Final  . RDW 10/05/2018 13.2  11.5 - 15.5 % Final  . Platelets 10/05/2018 92* 150 - 400 K/uL Final   Comment: Immature Platelet Fraction may be clinically indicated, consider ordering this additional test [id number] CONSISTENT WITH PREVIOUS RESULT   . nRBC 10/05/2018 0.0  0.0 - 0.2 % Final   Performed at  [facility] [address] [place], [place] [address]  . Sodium 10/05/2018 137  135 - 145 mmol/L Final  . Potassium 10/05/2018 4.5  3.5 - 5.1 mmol/L Final  . Chloride 10/05/2018 102  98 - 111 mmol/L Final  . CO2 10/05/2018 26  22 - 32 mmol/L Final  . Glucose, Bld 10/05/2018 171* 70 - 99 mg/dL Final  . BUN 10/05/2018 16  8 - 23 mg/dL Final  . Creatinine, Ser 10/05/2018 0.78  0.61 - 1.24 mg/dL Final  . Calcium 10/05/2018 8.3* 8.9 - 10.3 mg/dL Final  . GFR calc non Af Amer 10/05/2018 >60  >60 mL/min Final  . GFR calc Af Amer 10/05/2018 >60  >60 mL/min Final  . Anion gap 10/05/2018 9  5 - 15 Final   Performed at [facility], [place] [address] [place], [place] [address]  . Glucose-Capillary 10/04/2018 178* 70 - 99 mg/dL Final  . Glucose-Capillary 10/05/2018 141* 70 - 99 mg/dL Final  . Glucose-Capillary 10/05/2018 153* 70 - 99 mg/dL Final  . Glucose-Capillary 10/05/2018 139* 70 - 99 mg/dL Final  . WBC 10/06/2018 6.7  4.0 - 10.5 K/uL Final  . RBC 10/06/2018 3.60* 4.22 - 5.81 MIL/uL Final  . Hemoglobin 10/06/2018 11.5* 13.0 - 17.0 g/dL Final  . HCT 10/06/2018 34.3* 39.0 - 52.0 % Final  . MCV 10/06/2018 95.3  80.0 - 100.0 fL Final  . MCH 10/06/2018 31.9  26.0 - 34.0 pg Final  . MCHC 10/06/2018 33.5  30.0 - 36.0 g/dL Final  . RDW 10/06/2018 13.2  11.5 - 15.5 % Final  . Platelets 10/06/2018 73* 150 - 400 K/uL Final   Comment: Immature Platelet Fraction may be clinically indicated, consider ordering this additional test [id number]   . nRBC 10/06/2018 0.0  0.0 - 0.2 % Final   Performed at [facility] [address], [place], [place] [address]  . Sodium 10/06/2018 137  135 - 145 mmol/L Final  . Potassium 10/06/2018 4.0  3.5 - 5.1 mmol/L Final  . Chloride 10/06/2018 102  98 - 111 mmol/L Final  . CO2 10/06/2018 27  22 - 32 mmol/L Final  . Glucose, Bld 10/06/2018 181* 70 - 99 mg/dL Final  . BUN 10/06/2018 21  8 - 23 mg/dL Final  .  Creatinine, Ser 10/06/2018 0.89  0.61 - 1.24 mg/dL Final  . Calcium 10/06/2018 8.3* 8.9 - 10.3 mg/dL Final  . GFR calc non Af Amer 10/06/2018 >60  >60 mL/min Final  . GFR calc Af Amer 10/06/2018 >60  >60 mL/min Final  . Anion gap 10/06/2018 8  5 - 15 Final   Performed at [facility], [place] [address], [place], [place] [address]  .  Glucose-Capillary 10/05/2018 192* 70 - 99 mg/dL Final  . Glucose-Capillary 10/06/2018 167* 70 - 99 mg/dL Final  . Glucose-Capillary 10/06/2018 217* 70 - 99 mg/dL Final  . WBC 10/07/2018 5.6  4.0 - 10.5 K/uL Final  . RBC 10/07/2018 3.33* 4.22 - 5.81 MIL/uL Final  . Hemoglobin 10/07/2018 10.5* 13.0 - 17.0 g/dL Final  . HCT 10/07/2018 31.7* 39.0 - 52.0 % Final  . MCV 10/07/2018 95.2  80.0 - 100.0 fL Final  . MCH 10/07/2018 31.5  26.0 - 34.0 pg Final  . MCHC 10/07/2018 33.1  30.0 - 36.0 g/dL Final  . RDW 10/07/2018 13.2  11.5 - 15.5 % Final  . Platelets 10/07/2018 77* 150 - 400 K/uL Final   Comment: REPEATED TO VERIFY Immature Platelet Fraction may be clinically indicated, consider ordering this additional test [id number] CONSISTENT WITH PREVIOUS RESULT   . nRBC 10/07/2018 0.0  0.0 - 0.2 % Final   Performed at [facility] [address], [place], [place] [address]  . Glucose-Capillary 10/06/2018 172* 70 - 99 mg/dL Final  . Glucose-Capillary 10/06/2018 174* 70 - 99 mg/dL Final  . Glucose-Capillary 10/07/2018 160* 70 - 99 mg/dL Final  . Glucose-Capillary 10/07/2018 182* 70 - 99 mg/dL Final  . Glucose-Capillary 10/07/2018 216* 70 - 99 mg/dL Final  . Glucose-Capillary 10/07/2018 141* 70 - 99 mg/dL Final  . Glucose-Capillary 10/08/2018 155* 70 - 99 mg/dL Final  . Glucose-Capillary 10/08/2018 171* 70 - 99 mg/dL Final  . Glucose-Capillary 10/08/2018 146* 70 - 99 mg/dL Final  . Glucose-Capillary 10/08/2018 165* 70 - 99 mg/dL Final  . Glucose-Capillary 10/09/2018 149* 70 - 99 mg/dL Final  . Glucose-Capillary 10/09/2018 193* 70 -  99 mg/dL Final  . Glucose-Capillary 10/09/2018 155* 70 - 99 mg/dL Final  . Glucose-Capillary 10/09/2018 230* 70 - 99 mg/dL Final  . Glucose-Capillary 10/10/2018 169* 70 - 99 mg/dL Final  . Glucose-Capillary 10/10/2018 160* 70 - 99 mg/dL Final  . Glucose-Capillary 10/10/2018 201* 70 - 99 mg/dL Final  . Glucose-Capillary 10/10/2018 184* 70 - 99 mg/dL Final  . Glucose-Capillary 10/10/2018 194* 70 - 99 mg/dL Final  . Glucose-Capillary 10/11/2018 160* 70 - 99 mg/dL Final  Hospital Outpatient Visit on 09/27/2018  Component Date Value Ref Range Status  . aPTT 09/27/2018 29  24 - 36 seconds Final   Performed at [facility], [place] [address], [place], [place] [address]  . WBC 09/27/2018 4.6  4.0 - 10.5 K/uL Final  . RBC 09/27/2018 4.27  4.22 - 5.81 MIL/uL Final  . Hemoglobin 09/27/2018 13.7  13.0 - 17.0 g/dL Final  . HCT 09/27/2018 40.4  39.0 - 52.0 % Final  . MCV 09/27/2018 94.6  80.0 - 100.0 fL Final  . MCH 09/27/2018 32.1  26.0 - 34.0 pg Final  . MCHC 09/27/2018 33.9  30.0 - 36.0 g/dL Final  . RDW 09/27/2018 12.9  11.5 - 15.5 % Final  . Platelets 09/27/2018 84* 150 - 400 K/uL Final   Comment: PLATELET COUNT CONFIRMED BY SMEAR SPECIMEN CHECKED FOR CLOTS Immature Platelet Fraction may be clinically indicated, consider ordering this additional test [id number]   . nRBC 09/27/2018 0.0  0.0 - 0.2 % Final   Performed at [facility] [address] [place], [place] [address]  . Sodium 09/27/2018 142  135 - 145 mmol/L Final  . Potassium 09/27/2018 4.4  3.5 - 5.1 mmol/L Final  . Chloride 09/27/2018 108  98 - 111 mmol/L Final  . CO2 09/27/2018  25  22 - 32 mmol/L Final  . Glucose, Bld 09/27/2018 119* 70 - 99 mg/dL Final  . BUN 09/27/2018 24* 8 - 23 mg/dL Final  . Creatinine, Ser 09/27/2018 0.93  0.61 - 1.24 mg/dL Final  . Calcium 09/27/2018 8.7* 8.9 - 10.3 mg/dL Final  . Total Protein 09/27/2018 7.2  6.5 - 8.1 g/dL Final  . Albumin 09/27/2018 4.1  3.5 - 5.0  g/dL Final  . AST 09/27/2018 35  15 - 41 U/L Final  . ALT 09/27/2018 48* 0 - 44 U/L Final  . Alkaline Phosphatase 09/27/2018 60  38 - 126 U/L Final  . Total Bilirubin 09/27/2018 1.1  0.3 - 1.2 mg/dL Final  . GFR calc non Af Amer 09/27/2018 >60  >60 mL/min Final  . GFR calc Af Amer 09/27/2018 >60  >60 mL/min Final  . Anion gap 09/27/2018 9  5 - 15 Final   Performed at [facility], [place] [address] [place], [place] [address]  . Prothrombin Time 09/27/2018 13.9  11.4 - 15.2 seconds Final  . INR 09/27/2018 1.08   Final   Performed at [facility] [address], [place], [place] [address]  . ABO/RH(D) 09/27/2018 O POS   Final  . Antibody Screen 09/27/2018 NEG   Final  . Sample Expiration 09/27/2018 10/07/2018   Final  . Extend sample reason 09/27/2018    Final                   Value:NO TRANSFUSIONS OR PREGNANCY IN THE PAST 3 MONTHS Performed at [facility], [place] [address], [place], [place] [address]   . MRSA, PCR 09/27/2018 NEGATIVE  NEGATIVE Final  . Staphylococcus aureus 09/27/2018 NEGATIVE  NEGATIVE Final   Comment: (NOTE) The Xpert SA Assay (FDA approved for NASAL specimens in patients 63 years of age and older), is one component of a comprehensive surveillance program. It is not intended to diagnose infection nor to guide or monitor treatment. Performed at [facility], [place] [address], [place], [place] [address]   . Hgb A1c MFr Bld 09/27/2018 6.5* 4.8 - 5.6 % Final   Comment: (NOTE) Pre diabetes:          5.7%-6.4% Diabetes:              >6.4% Glycemic control for   <7.0% adults with diabetes   . Mean Plasma Glucose 09/27/2018 139.85  mg/dL Final   Performed at [facility] [address] [place], [place] [address]  . Glucose-Capillary 09/27/2018 109* 70 - 99 mg/dL Final  . ABO/RH(D) 09/27/2018    Final                   Value:O POS Performed at [facility], [place]  [address], [place], [place] [address]      X-Rays:Dg Chest Port 1 View  Result Date: 10/06/2018 CLINICAL DATA:  Dyspnea, recent knee replacement EXAM: PORTABLE CHEST 1 VIEW COMPARISON:  03/25/2018 chest radiograph. FINDINGS: Low lung volumes. Stable cardiomediastinal silhouette with normal heart size. No pneumothorax. No pleural effusion. Lungs appear clear, with no acute consolidative airspace disease and no pulmonary edema. IMPRESSION: Low lung volumes, with no active cardiopulmonary disease. Electronically Signed   By: Ilona Sorrel M.D.   On: 10/06/2018 16:09    EKG: Orders placed or performed during the hospital encounter of 10/04/18  . EKG 12-Lead  . EKG 12-Lead     Hospital Course: Jacob  LUISMARIO Bishop is a 78 y.o. who was admitted to [facility]. They were brought to the operating room on 10/04/2018 and underwent Procedure(s): RIGHT TOTAL KNEE ARTHROPLASTY.  Patient tolerated the procedure well and was later transferred to the recovery room and then to the orthopaedic floor for postoperative care. They were given PO and IV analgesics for pain control following their surgery. They were given 24 hours of postoperative antibiotics of  Anti-infectives (From admission, onward)   Start     Dose/Rate Route Frequency Ordered Stop   10/04/18 1800  ceFAZolin (ANCEF) IVPB 2g/100 mL premix     2 g 200 mL/hr over 30 Minutes Intravenous Every 6 hours 10/04/18 1538 10/05/18 0009   10/04/18 0600  ceFAZolin (ANCEF) 3 g in dextrose 5 % 50 mL IVPB     3 g 100 mL/hr over 30 Minutes Intravenous On call to O.R. 10/03/18 1002 10/04/18 1317     and started on DVT prophylaxis in the form of Aspirin.   PT and OT were ordered for total joint protocol. Discharge planning consulted to help with postop disposition and equipment needs. Patient had a decent night on the evening of surgery. They started to get up OOB with therapy on POD #0. Hemovac drain was pulled without difficulty on day one. Continued to work with  therapy into POD #2. Pt was seen during rounds on day two and was ready to go home pending progress with therapy. Dressing was changed and the incision was clean, dry, and intact with no drainage. He was having issues voiding, and was I&O catheterized twice. Was slow to progress with therapy and was not cleared for discharge by PT. Later on day two, was notified by Sarajane Marek, RN that patient was experiencing weakness while working with physical therapy. Oxygen saturation dropped to 83% on room air and heart rate was increasing to 140s with attempted ambulation and around 100 at rest. No history of arrhythmias. Chest XR obtained which showed no active cardiopulmonary disease. 12-lead EKG performed which showed sinus tachycardia. Updated vital signs following completed orders showed oxygen saturation between 90-95% with HR in the low 100s. Vital signs remained stable throughout day two and into POD #3. Oxygen saturation had improved on RA and patient was no longer experiencing SOB, no further orders were required. Patient was unable to void on his own on day three and a foley catheter was placed. He was still not meeting his goals with physical therapy. Patient continued to work with therapy through the weekend on POD #5 and #6 and was still progressing slowly with therapy. Wife did not feel that she could take care of him at home and he required a consult for SNF placement. A urology consult was placed on day 6 to replace catheter. Incision was healing well. Patient continued to work with therapy on POD #7 and was ready to discharge pending bed availability.  Diet: Diabetic diet Activity: WBAT Follow-up: in 2 weeks Disposition: Skilled nursing facility Discharged Condition: stable   Discharge Instructions    Call MD / Call 911   Complete by:  As directed    If you experience chest pain or shortness of breath, CALL 911 and be transported to the hospital emergency room.  If you develope a fever above  101 F, pus (white drainage) or increased drainage or redness at the wound, or calf pain, call your surgeon's office.   Change dressing   Complete by:  As directed    Change dressing  on Wednesday, then change the dressing daily with sterile 4 x 4 inch gauze dressing and apply TED hose.   Constipation Prevention   Complete by:  As directed    Drink plenty of fluids.  Prune juice may be helpful.  You may use a stool softener, such as Colace (over the counter) 100 mg twice a day.  Use MiraLax (over the counter) for constipation as needed.   Diet - low sodium heart healthy   Complete by:  As directed    Discharge instructions   Complete by:  As directed    Dr. Gaynelle Arabian Total Joint Specialist [facility] [address] [place], [place] [address] [phone]  TOTAL KNEE REPLACEMENT POSTOPERATIVE DIRECTIONS  Knee Rehabilitation, Guidelines Following Surgery  Results after knee surgery are often greatly improved when you follow the exercise, range of motion and muscle strengthening exercises prescribed by your doctor. Safety measures are also important to protect the knee from further injury. Any time any of these exercises cause you to have increased pain or swelling in your knee joint, decrease the amount until you are comfortable again and slowly increase them. If you have problems or questions, call your caregiver or physical therapist for advice.   HOME CARE INSTRUCTIONS  Remove items at home which could result in a fall. This includes throw rugs or furniture in walking pathways.  ICE to the affected knee every three hours for 30 minutes at a time and then as needed for pain and swelling.  Continue to use ice on the knee for pain and swelling from surgery. You may notice swelling that will progress down to the foot and ankle.  This is normal after surgery.  Elevate the leg when you are not up walking on it.   Continue to use the breathing machine which will help keep your  temperature down.  It is common for your temperature to cycle up and down following surgery, especially at night when you are not up moving around and exerting yourself.  The breathing machine keeps your lungs expanded and your temperature down. Do not place pillow under knee, focus on keeping the knee straight while resting   DIET You may resume your previous home diet once your are discharged from the hospital.  DRESSING / WOUND CARE / SHOWERING You may shower 3 days after surgery, but keep the wounds dry during showering.  You may use an occlusive plastic wrap (Press'n Seal for example), NO SOAKING/SUBMERGING IN THE BATHTUB.  If the bandage gets wet, change with a clean dry gauze.  If the incision gets wet, pat the wound dry with a clean towel. You may start showering once you are discharged home but do not submerge the incision under water. Just pat the incision dry and apply a dry gauze dressing on daily. Change the surgical dressing daily and reapply a dry dressing each time.  ACTIVITY Walk with your walker as instructed. Use walker as long as suggested by your caregivers. Avoid periods of inactivity such as sitting longer than an hour when not asleep. This helps prevent blood clots.  You may resume a sexual relationship in one month or when given the OK by your doctor.  You may return to work once you are cleared by your doctor.  Do not drive a car for 6 weeks or until released by you surgeon.  Do not drive while taking narcotics.  WEIGHT BEARING Weight bearing as tolerated with assist device (walker, cane, etc) as  directed, use it as long as suggested by your surgeon or therapist, typically at least 4-6 weeks.  POSTOPERATIVE CONSTIPATION PROTOCOL Constipation - defined medically as fewer than three stools per week and severe constipation as less than one stool per week.  One of the most common issues patients have following surgery is constipation.  Even if you have a regular bowel  pattern at home, your normal regimen is likely to be disrupted due to multiple reasons following surgery.  Combination of anesthesia, postoperative narcotics, change in appetite and fluid intake all can affect your bowels.  In order to avoid complications following surgery, here are some recommendations in order to help you during your recovery period.  Colace (docusate) - Pick up an over-the-counter form of Colace or another stool softener and take twice a day as long as you are requiring postoperative pain medications.  Take with a full glass of water daily.  If you experience loose stools or diarrhea, hold the colace until you stool forms back up.  If your symptoms do not get better within 1 week or if they get worse, check with your doctor.  Dulcolax (bisacodyl) - Pick up over-the-counter and take as directed by the product packaging as needed to assist with the movement of your bowels.  Take with a full glass of water.  Use this product as needed if not relieved by Colace only.   MiraLax (polyethylene glycol) - Pick up over-the-counter to have on hand.  MiraLax is a solution that will increase the amount of water in your bowels to assist with bowel movements.  Take as directed and can mix with a glass of water, juice, soda, coffee, or tea.  Take if you go more than two days without a movement. Do not use MiraLax more than once per day. Call your doctor if you are still constipated or irregular after using this medication for 7 days in a row.  If you continue to have problems with postoperative constipation, please contact the office for further assistance and recommendations.  If you experience "the worst abdominal pain ever" or develop nausea or vomiting, please contact the office immediatly for further recommendations for treatment.  ITCHING  If you experience itching with your medications, try taking only a single pain pill, or even half a pain pill at a time.  You can also use Benadryl over the  counter for itching or also to help with sleep.   TED HOSE STOCKINGS Wear the elastic stockings on both legs for three weeks following surgery during the day but you may remove then at night for sleeping.  MEDICATIONS See your medication summary on the "After Visit Summary" that the nursing staff will review with you prior to discharge.  You may have some home medications which will be placed on hold until you complete the course of blood thinner medication.  It is important for you to complete the blood thinner medication as prescribed by your surgeon.  Continue your approved medications as instructed at time of discharge.  PRECAUTIONS If you experience chest pain or shortness of breath - call 911 immediately for transfer to the hospital emergency department.  If you develop a fever greater that 101 F, purulent drainage from wound, increased redness or drainage from wound, foul odor from the wound/dressing, or calf pain - CONTACT YOUR SURGEON.  FOLLOW-UP APPOINTMENTS Make sure you keep all of your appointments after your operation with your surgeon and caregivers. You should call the office at the above phone number and make an appointment for approximately two weeks after the date of your surgery or on the date instructed by your surgeon outlined in the "After Visit Summary".   RANGE OF MOTION AND STRENGTHENING EXERCISES  Rehabilitation of the knee is important following a knee injury or an operation. After just a few days of immobilization, the muscles of the thigh which control the knee become weakened and shrink (atrophy). Knee exercises are designed to build up the tone and strength of the thigh muscles and to improve knee motion. Often times heat used for twenty to thirty minutes before working out will loosen up your tissues and help with improving the range of motion but do not use heat for the first two weeks following surgery. These  exercises can be done on a training (exercise) mat, on the floor, on a table or on a bed. Use what ever works the best and is most comfortable for you Knee exercises include:  Leg Lifts - While your knee is still immobilized in a splint or cast, you can do straight leg raises. Lift the leg to 60 degrees, hold for 3 sec, and slowly lower the leg. Repeat 10-20 times 2-3 times daily. Perform this exercise against resistance later as your knee gets better.  Quad and Hamstring Sets - Tighten up the muscle on the front of the thigh (Quad) and hold for 5-10 sec. Repeat this 10-20 times hourly. Hamstring sets are done by pushing the foot backward against an object and holding for 5-10 sec. Repeat as with quad sets.  Leg Slides: Lying on your back, slowly slide your foot toward your buttocks, bending your knee up off the floor (only go as far as is comfortable). Then slowly slide your foot back down until your leg is flat on the floor again. Angel Wings: Lying on your back spread your legs to the side as far apart as you can without causing discomfort.  A rehabilitation program following serious knee injuries can speed recovery and prevent re-injury in the future due to weakened muscles. Contact your doctor or a physical therapist for more information on knee rehabilitation.   IF YOU ARE TRANSFERRED TO A SKILLED REHAB FACILITY If the patient is transferred to a skilled rehab facility following release from the hospital, a list of the current medications will be sent to the facility for the patient to continue.  When discharged from the skilled rehab facility, please have the facility set up the patient's [place] prior to being released. Also, the skilled facility will be responsible for providing the patient with their medications at time of release from the facility to include their pain medication, the muscle relaxants, and their blood thinner medication. If the patient is still at the rehab  facility at time of the two week follow up appointment, the skilled rehab facility will also need to assist the patient in arranging follow up appointment in our office and any transportation needs.  MAKE SURE YOU:  Understand these instructions.  Get help right away if you are not doing well or get worse.    Pick up stool softner and laxative for home use following surgery while on pain medications. Do not submerge incision under water. Please use good hand washing techniques while changing dressing each day. May shower starting three days after surgery.  Please use a clean towel to pat the incision dry following showers. Continue to use ice for pain and swelling after surgery. Do not use any lotions or creams on the incision until instructed by your surgeon.   Do not put a pillow under the knee. Place it under the heel.   Complete by:  As directed    Driving restrictions   Complete by:  As directed    No driving for two weeks   TED hose   Complete by:  As directed    Use stockings (TED hose) for three weeks on both leg(s).  You may remove them at night for sleeping.   Weight bearing as tolerated   Complete by:  As directed      Allergies as of 10/11/2018      Reactions   Lipitor [atorvastatin Calcium] Other (See Comments)   Muscle soreness   Oxycodone Anxiety   Causes patient to become shaky and dizzy. Unable to tolerate.      Medication List    STOP taking these medications   aspirin 81 MG tablet Replaced by:  aspirin 325 MG EC tablet     TAKE these medications   albuterol 108 (90 Base) MCG/ACT inhaler Commonly known as:  PROVENTIL HFA;VENTOLIN HFA Inhale 2 puffs into the lungs every 6 (six) hours as needed for wheezing or shortness of breath.   amLODipine 2.5 MG tablet Commonly known as:  NORVASC TAKE 1 TABLET BY MOUTH EVERY DAY   aspirin 325 MG EC tablet Take 1 tablet (325 mg total) by mouth 2 (two) times daily for 17 days. Then resume one 81 mg aspirin once a  day. Replaces:  aspirin 81 MG tablet   BASAGLAR KWIKPEN 100 UNIT/ML Sopn Inject 0.4 mLs (40 Units total) into the skin every morning. And pen needles 1/day   BD PEN NEEDLE NANO U/F 32G X 4 MM Misc Generic drug:  Insulin Pen Needle USE AS DIRECTED TWICE A DAY   fluticasone 50 MCG/ACT nasal spray Commonly known as:  FLONASE PLACE 2 SPRAYS INTO BOTH NOSTRILS DAILY AS NEEDED. What changed:  reasons to take this   gabapentin 300 MG capsule Commonly known as:  NEURONTIN Take 1 capsule (300 mg total) by mouth 3 (three) times daily. Take a 300 mg capsule three times a day for two weeks following surgery.Then take a 300 mg capsule two times a day for two weeks. Then take a 300 mg capsule once a day for two weeks. Then discontinue.   glipiZIDE 10 MG 24 hr tablet Commonly known as:  GLUCOTROL XL TAKE 1 TABLET (10 MG TOTAL) BY MOUTH DAILY WITH BREAKFAST. What changed:  when to take this   meclizine 12.5 MG tablet Commonly known as:  ANTIVERT Take 1 tablet (12.5 mg total) by mouth 3 (three) times daily as needed for dizziness.   methocarbamol 500 MG tablet Commonly known as:  ROBAXIN Take 1 tablet (500 mg total) by mouth every 6 (six) hours as needed for muscle spasms.   simvastatin 20 MG tablet Commonly known as:  ZOCOR TAKE 1 TABLET BY MOUTH EVERY DAY AT BEDTIME   tamsulosin 0.4 MG Caps capsule Commonly known as:  FLOMAX Take 0.4 mg by mouth daily.   TOVIAZ 8 MG Tb24 tablet Generic drug:  fesoterodine Take 8 mg by mouth daily.   traMADol 50 MG tablet Commonly known as:  ULTRAM Take 1-2 tablets (50-100 mg total) by mouth every 6 (six) hours as needed for moderate pain.  VICTOZA 18 MG/3ML Sopn Generic drug:  liraglutide INJECT 1.2MG  UNDER THE SKIN EVERY DAY AS DIRECTED What changed:  See the new instructions.            Durable Medical Equipment  (From admission, onward)         Start     Ordered   10/05/18 0940  For home use only DME Bedside commode  Once      Question:  Patient needs a bedside commode to treat with the following condition  Answer:  Total knee replacement status   10/05/18 0941           Discharge Care Instructions  (From admission, onward)         Start     Ordered   10/05/18 0000  Weight bearing as tolerated     10/05/18 0712   10/05/18 0000  Change dressing    Comments:  Change dressing on Wednesday, then change the dressing daily with sterile 4 x 4 inch gauze dressing and apply TED hose.   10/05/18 3818         Follow-up Information    Gaynelle Arabian, MD. Go on 10/19/2018.   Specialty:  Orthopedic Surgery Why:  You are scheduled for a post-operative appointment on 10-19-2018 at 1:30 pm.  Contact information: [address] [place] [address] [phone]        [facility]. Go on 10/08/2018.   Why:  You are scheduled for a physical therapy appointment on 10-08-2018 at 9:30 am with Pricilla Riffle at [facility]. Contact information: [address], [place], [place] [address] [phone]          Signed: Theresa Duty, PA-C Orthopedic Surgery 10/11/2018, 10:06 AM

## 2018-10-11 NOTE — Care Management Note (Signed)
Case Management Note  Patient Details  Name: Jacob Bishop MRN: [id number] Date of Birth: April 13, 1941  Subjective/Objective:                  Discharge planning  Action/Plan: Patient is going to snf today  Expected Discharge Date:  10/11/18               Expected Discharge Plan:  [place]  In-House Referral:  NA  Discharge planning Services  CM Consult  Post Acute Care Choice:  Durable Medical Equipment Choice offered to:  Patient  DME Arranged:  [facility] rolling DME Agency:  [facility]  HH Arranged:  NA HH Agency:  NA  Status of Service:  Completed, signed off  If discussed at [place] of Stay Meetings, dates discussed:    Additional Comments:  [name], RN 10/11/2018, 11:01 AM

## 2018-10-11 NOTE — Care Management Important Message (Signed)
Important Message  Patient Details  Name: Jacob Bishop MRN: [id number] Date of Birth: 04/23/1941   Medicare Important Message Given:  Yes    Kerin Salen 10/11/2018, 3:06 [place] Message  Patient Details  Name: Jacob Bishop MRN: [id number] Date of Birth: September 17, 1940   Medicare Important Message Given:  Yes    Kerin Salen 10/11/2018, 3:06 PM

## 2018-10-11 NOTE — Progress Notes (Signed)
   Subjective: 7 Days Post-Op Procedure(s) (LRB): RIGHT TOTAL KNEE ARTHROPLASTY (Right) Patient reports pain as mild.   Plan is to go Skilled nursing facility after hospital stay. Patient did not progress at all this weekend and thus will not be able to go home. Will be discharged to SNF Had to get urology consult last night to replace catheter  Objective: Vital signs in last 24 hours: Temp:  [98.1 F (36.7 C)] 98.1 F (36.7 C) (02/17 0519) Pulse Rate:  [89] 89 (02/17 0519) Resp:  [16] 16 (02/17 0519) BP: (135)/(75) 135/75 (02/17 0519) SpO2:  [88 %] 88 % (02/17 0519)  Intake/Output from previous day:  Intake/Output Summary (Last 24 hours) at 10/11/2018 0708 Last data filed at 10/11/2018 0656 Gross per 24 hour  Intake 720 ml  Output 1500 ml  Net -780 ml    Intake/Output this shift: No intake/output data recorded.  Labs: No results for input(s): HGB in the last 72 hours. No results for input(s): WBC, RBC, HCT, PLT in the last 72 hours. No results for input(s): NA, K, CL, CO2, BUN, CREATININE, GLUCOSE, CALCIUM in the last 72 hours. No results for input(s): LABPT, INR in the last 72 hours.  EXAM General - Patient is Alert, Appropriate and Oriented Extremity - Neurologically intact Neurovascular intact No cellulitis present Compartment soft Dressing/Incision - clean, dry, no drainage Motor Function - intact, moving foot and toes well on exam.   Past Medical History:  Diagnosis Date  . AI (aortic insufficiency) 01/11/2018   Trace, noted on ECHO  . Arthritis   . CAD in native artery    Nuclear stress test 10/19: EF 58, normal perfusion, low risk  . Cellulitis   . Dermatitis   . Diabetes mellitus    type II  . Diastolic dysfunction 83/38/2505   Mild, noted on ECHO  . Diverticulosis 01/31/2016   ASCENDING COLON AND CECUM, NOTED ON COLONOSCOPY  . DOE (dyspnea on exertion)   . Edema   . Fatty liver 09/18/2004   Noted on [place] ABD  . History of kidney stones 05/23/2002    Noted on CT Abd  . Hx of colonic polyps 01/31/2016  . Hyperlipidemia   . Hypertension   . Internal hemorrhoids 01/31/2016   Noted on colonoscopy  . Obesity   . OSA (obstructive sleep apnea)    cpap  . Positional vertigo   . Pulmonary hypertension ([place]) 01/11/2018   Mild, noted on ECHO  . Rhinosinusitis   . Skin lesion   . TR (tricuspid regurgitation) 01/11/2018   Trace, noted on ECHO    Assessment/Plan: 7 Days Post-Op Procedure(s) (LRB): RIGHT TOTAL KNEE ARTHROPLASTY (Right) Principal Problem:   Osteoarthritis of right knee Active Problems:   OA (osteoarthritis) of knee   Discharge to SNF   Jacob Bishop 10/11/2018, 7:08 AM

## 2018-10-11 NOTE — Plan of Care (Signed)
Pt discharged to [facility] facility. Left in stable condition with PTAR

## 2018-10-11 NOTE — Progress Notes (Signed)
PT Cancellation Note  Patient Details Name: Jacob Bishop MRN: [id number] DOB: 12/27/1940   Cancelled Treatment:     pt requested to rest. "I'm leaving today".  Pt plans to D/C to [facility] today   Rica Koyanagi  PTA Acute  Rehabilitation Services Pager      [phone] Office      [phone]

## 2018-10-13 DIAGNOSIS — G4733 Obstructive sleep apnea (adult) (pediatric): Secondary | ICD-10-CM | POA: Diagnosis not present

## 2018-10-13 DIAGNOSIS — I10 Essential (primary) hypertension: Secondary | ICD-10-CM | POA: Diagnosis not present

## 2018-10-13 DIAGNOSIS — E119 Type 2 diabetes mellitus without complications: Secondary | ICD-10-CM | POA: Diagnosis not present

## 2018-10-13 DIAGNOSIS — Z96659 Presence of unspecified artificial knee joint: Secondary | ICD-10-CM | POA: Diagnosis not present

## 2018-10-13 DIAGNOSIS — I2510 Atherosclerotic heart disease of native coronary artery without angina pectoris: Secondary | ICD-10-CM | POA: Diagnosis not present

## 2018-10-17 DIAGNOSIS — L0390 Cellulitis, unspecified: Secondary | ICD-10-CM | POA: Diagnosis not present

## 2018-10-19 DIAGNOSIS — R338 Other retention of urine: Secondary | ICD-10-CM | POA: Diagnosis not present

## 2018-10-22 ENCOUNTER — Other Ambulatory Visit: Payer: Self-pay | Admitting: Adult Health

## 2018-10-22 DIAGNOSIS — M25661 Stiffness of right knee, not elsewhere classified: Secondary | ICD-10-CM | POA: Diagnosis not present

## 2018-10-22 NOTE — Telephone Encounter (Signed)
Sent to the pharmacy by e-scribe for 90 days.  Pt recently had knee surgery.

## 2018-10-26 DIAGNOSIS — M25661 Stiffness of right knee, not elsewhere classified: Secondary | ICD-10-CM | POA: Diagnosis not present

## 2018-10-28 DIAGNOSIS — M25661 Stiffness of right knee, not elsewhere classified: Secondary | ICD-10-CM | POA: Diagnosis not present

## 2018-11-02 DIAGNOSIS — M25661 Stiffness of right knee, not elsewhere classified: Secondary | ICD-10-CM | POA: Diagnosis not present

## 2018-11-03 ENCOUNTER — Ambulatory Visit: Payer: Medicare Other | Admitting: Adult Health

## 2018-11-04 ENCOUNTER — Encounter: Payer: Self-pay | Admitting: Adult Health

## 2018-11-04 ENCOUNTER — Ambulatory Visit (INDEPENDENT_AMBULATORY_CARE_PROVIDER_SITE_OTHER): Payer: Medicare Other | Admitting: Adult Health

## 2018-11-04 ENCOUNTER — Other Ambulatory Visit: Payer: Self-pay

## 2018-11-04 VITALS — BP 128/58 | Temp 97.8°F | Wt 265.0 lb

## 2018-11-04 DIAGNOSIS — K2100 Gastro-esophageal reflux disease with esophagitis, without bleeding: Secondary | ICD-10-CM

## 2018-11-04 MED ORDER — OMEPRAZOLE 20 MG PO CPDR: 20.0000 mg | DELAYED_RELEASE_CAPSULE | Freq: Every day | ORAL | 0 refills | Status: DC

## 2018-11-04 NOTE — Progress Notes (Signed)
Subjective:    Patient ID: Jacob Bishop, male    DOB: 1940-11-25, 78 y.o.   MRN: [id number]  HPI 78 year old male who  has a past medical history of AI (aortic insufficiency) (01/11/2018), Arthritis, CAD in native artery, Cellulitis, Dermatitis, Diabetes mellitus, Diastolic dysfunction (97/67/3419), Diverticulosis (01/31/2016), DOE (dyspnea on exertion), Edema, Fatty liver (09/18/2004), History of kidney stones (05/23/2002), colonic polyps (01/31/2016), Hyperlipidemia, Hypertension, Internal hemorrhoids (01/31/2016), Obesity, OSA (obstructive sleep apnea), Positional vertigo, Pulmonary hypertension ([place]) (01/11/2018), Rhinosinusitis, Skin lesion, and TR (tricuspid regurgitation) (01/11/2018).  He presents to the office today for the acute complaint of nausea and diarrhea. He had a right knee replacement in February and then went to [facility] for rehab. While he was in rehab he started to experience episodes of nausea and diarrhea.  Shaded symptoms include excessive belching, burning sensation in his esophagus, and "feeling like my stomach turns over".  Symptoms are worse after he eats a meal.  Has been eating a lot of [place] food.  Ports that his knee surgery went well and he is currently working with physical therapy.  Has very little discomfort and feels as though he is getting his strength back.   Review of Systems See HPI   Past Medical History:  Diagnosis Date  . AI (aortic insufficiency) 01/11/2018   Trace, noted on ECHO  . Arthritis   . CAD in native artery    Nuclear stress test 10/19: EF 58, normal perfusion, low risk  . Cellulitis   . Dermatitis   . Diabetes mellitus    type II  . Diastolic dysfunction 37/90/2409   Mild, noted on ECHO  . Diverticulosis 01/31/2016   ASCENDING COLON AND CECUM, NOTED ON COLONOSCOPY  . DOE (dyspnea on exertion)   . Edema   . Fatty liver 09/18/2004   Noted on [place] ABD  . History of kidney stones 05/23/2002   Noted on CT Abd  . Hx of colonic  polyps 01/31/2016  . Hyperlipidemia   . Hypertension   . Internal hemorrhoids 01/31/2016   Noted on colonoscopy  . Obesity   . OSA (obstructive sleep apnea)    cpap  . Positional vertigo   . Pulmonary hypertension ([place]) 01/11/2018   Mild, noted on ECHO  . Rhinosinusitis   . Skin lesion   . TR (tricuspid regurgitation) 01/11/2018   Trace, noted on ECHO    Social History   Socioeconomic History  . Marital status: Married    Spouse name: Not on file  . Number of children: Not on file  . Years of education: Not on file  . Highest education level: Not on file  Occupational History  . Occupation: Financial controller, Health and safety inspector: [employer]  Social Needs  . Financial resource strain: Not on file  . Food insecurity:    Worry: Not on file    Inability: Not on file  . Transportation needs:    Medical: Not on file    Non-medical: Not on file  Tobacco Use  . Smoking status: Former Smoker    Packs/day: 1.00    Years: 20.00    Pack years: 20.00    Types: Cigarettes    Last attempt to quit: 01/10/1996    Years since quitting: 22.8  . Smokeless tobacco: Never Used  Substance and Sexual Activity  . Alcohol use: No  . Drug use: No  . Sexual activity: Not on file  Lifestyle  . Physical activity:    Days  per week: Not on file    Minutes per session: Not on file  . Stress: Not on file  Relationships  . Social connections:    Talks on phone: Not on file    Gets together: Not on file    Attends religious service: Not on file    Active member of club or organization: Not on file    Attends meetings of clubs or organizations: Not on file    Relationship status: Not on file  . Intimate partner violence:    Fear of current or ex partner: Not on file    Emotionally abused: Not on file    Physically abused: Not on file    Forced sexual activity: Not on file  Other Topics Concern  . Not on file  Social History Narrative   Grew up in [place]. Mother was [place],  Father [place].   He works daily - owns a [employer]    Married for 30+ years   Has a daughter who lives in [place]     Past Surgical History:  Procedure Laterality Date  . CARDIAC CATHETERIZATION    . COLONOSCOPY    . CORONARY STENT PLACEMENT     3 stents /1997  . POLYPECTOMY    . TONSILLECTOMY    . TOTAL KNEE ARTHROPLASTY Right 10/04/2018   Procedure: RIGHT TOTAL KNEE ARTHROPLASTY;  Surgeon: Gaynelle Arabian, MD;  Location: [facility];  Service: Orthopedics;  Laterality: Right;  Adductor Block    Family History  Problem Relation Age of Onset  . Cancer Mother        ? lung cancer  . Heart disease Father   . Heart attack Father   . Heart disease Sister   . Diabetes Sister   . Heart attack Sister   . Heart attack Brother     Allergies  Allergen Reactions  . Lipitor [Atorvastatin Calcium] Other (See Comments)    Muscle soreness  . Oxycodone Anxiety    Causes patient to become shaky and dizzy. Unable to tolerate.    Current Outpatient Medications on File Prior to Visit  Medication Sig Dispense Refill  . albuterol (PROVENTIL HFA;VENTOLIN HFA) 108 (90 Base) MCG/ACT inhaler Inhale 2 puffs into the lungs every 6 (six) hours as needed for wheezing or shortness of breath. 1 Inhaler 0  . amLODipine (NORVASC) 2.5 MG tablet TAKE 1 TABLET BY MOUTH EVERY DAY (Patient taking differently: Take 2.5 mg by mouth daily. ) 90 tablet 1  . BD PEN NEEDLE NANO U/F 32G X 4 MM MISC USE AS DIRECTED TWICE A DAY 200 each 0  . fluticasone (FLONASE) 50 MCG/ACT nasal spray PLACE 2 SPRAYS INTO BOTH NOSTRILS DAILY AS NEEDED. (Patient taking differently: Place 2 sprays into both nostrils daily as needed for rhinitis. ) 48 g 2  . gabapentin (NEURONTIN) 300 MG capsule Take 1 capsule (300 mg total) by mouth 3 (three) times daily. Take a 300 mg capsule three times a day for two weeks following surgery.Then take a 300 mg capsule two times a day for two weeks. Then take a 300 mg capsule once a  day for two weeks. Then discontinue. 84 capsule 0  . glipiZIDE (GLUCOTROL XL) 10 MG 24 hr tablet TAKE 1 TABLET (10 MG TOTAL) BY MOUTH DAILY WITH BREAKFAST. 90 tablet 0  . Insulin Glargine (BASAGLAR KWIKPEN) 100 UNIT/ML SOPN Inject 0.4 mLs (40 Units total) into the skin every morning. And pen needles 1/day 36 mL 0  .  meclizine (ANTIVERT) 12.5 MG tablet Take 1 tablet (12.5 mg total) by mouth 3 (three) times daily as needed for dizziness. 30 tablet 1  . methocarbamol (ROBAXIN) 500 MG tablet Take 1 tablet (500 mg total) by mouth every 6 (six) hours as needed for muscle spasms. 40 tablet 0  . simvastatin (ZOCOR) 20 MG tablet TAKE 1 TABLET BY MOUTH EVERYDAY AT BEDTIME 90 tablet 0  . tamsulosin (FLOMAX) 0.4 MG CAPS capsule Take 0.4 mg by mouth daily.  11  . TOVIAZ 8 MG TB24 tablet Take 8 mg by mouth daily.     Marland Kitchen VICTOZA 18 MG/3ML SOPN INJECT 1.2MG  UNDER THE SKIN EVERY DAY AS DIRECTED (Patient taking differently: Inject 1.2 mg into the skin daily. ) 15 mL 1   No current facility-administered medications on file prior to visit.     BP (!) 128/58   Temp 97.8 F (36.6 C)   Wt 265 lb (120.2 kg)   BMI 36.96 kg/m       Objective:   Physical Exam Vitals signs and nursing note reviewed.  Constitutional:      Appearance: Normal appearance.  Cardiovascular:     Rate and Rhythm: Normal rate and regular rhythm.     Pulses: Normal pulses.     Heart sounds: Normal heart sounds.  Pulmonary:     Effort: Pulmonary effort is normal.     Breath sounds: Normal breath sounds.  Abdominal:     General: Bowel sounds are normal.     Palpations: Abdomen is soft.     Tenderness: There is abdominal tenderness in the epigastric area.  Musculoskeletal:     Comments: Healed surgical scar on right knee.  He is using a rolling walker.  Slow steady gait.  Skin:    General: Skin is warm and dry.  Neurological:     General: No focal deficit present.     Mental Status: He is alert and oriented to person, place, and  time.  Psychiatric:        Mood and Affect: Mood normal.        Behavior: Behavior normal.        Thought Content: Thought content normal.        Judgment: Judgment normal.        Assessment & Plan:  1. Gastroesophageal reflux disease with esophagitis -Symptoms appear to be from acid reflux.  We will have him take 20 mg of Prilosec daily for 30 days.  If his symptoms do not start to resolve within the next 1 to 2 weeks and he is to follow-up.  Advised to stay away from spicy or acidic foods - omeprazole (PRILOSEC) 20 MG capsule; Take 1 capsule (20 mg total) by mouth daily.  Dispense: 30 capsule; Refill: 0  [name], NP

## 2018-11-05 DIAGNOSIS — M25669 Stiffness of unspecified knee, not elsewhere classified: Secondary | ICD-10-CM | POA: Diagnosis not present

## 2018-11-09 DIAGNOSIS — Z471 Aftercare following joint replacement surgery: Secondary | ICD-10-CM | POA: Diagnosis not present

## 2018-11-09 DIAGNOSIS — Z96651 Presence of right artificial knee joint: Secondary | ICD-10-CM | POA: Diagnosis not present

## 2018-11-09 DIAGNOSIS — M25669 Stiffness of unspecified knee, not elsewhere classified: Secondary | ICD-10-CM | POA: Diagnosis not present

## 2018-11-26 ENCOUNTER — Other Ambulatory Visit: Payer: Self-pay | Admitting: Adult Health

## 2018-11-26 DIAGNOSIS — K2100 Gastro-esophageal reflux disease with esophagitis, without bleeding: Secondary | ICD-10-CM

## 2018-11-26 NOTE — Telephone Encounter (Signed)
Sent to the pharmacy by e-scribe. 

## 2018-12-03 ENCOUNTER — Other Ambulatory Visit: Payer: Self-pay | Admitting: Endocrinology

## 2018-12-03 NOTE — Telephone Encounter (Signed)
Please refill x 1 Ov is due  

## 2018-12-08 ENCOUNTER — Ambulatory Visit (INDEPENDENT_AMBULATORY_CARE_PROVIDER_SITE_OTHER): Payer: Medicare Other | Admitting: Adult Health

## 2018-12-08 ENCOUNTER — Other Ambulatory Visit: Payer: Self-pay

## 2018-12-08 ENCOUNTER — Encounter: Payer: Self-pay | Admitting: Adult Health

## 2018-12-08 DIAGNOSIS — L03115 Cellulitis of right lower limb: Secondary | ICD-10-CM | POA: Diagnosis not present

## 2018-12-08 MED ORDER — DOXYCYCLINE HYCLATE 100 MG PO CAPS: 100.0000 mg | ORAL_CAPSULE | Freq: Two times a day (BID) | ORAL | 0 refills | Status: DC

## 2018-12-08 NOTE — Progress Notes (Signed)
Virtual Visit via Video Note  I connected with Jacob Bishop on 12/08/18 at  3:30 PM EDT by a video enabled telemedicine application and verified that I am speaking with the correct person using two identifiers.  Location patient: home Location provider:work or home office Persons participating in the virtual visit: patient, provider  I discussed the limitations of evaluation and management by telemedicine and the availability of in person appointments. The patient expressed understanding and agreed to proceed.   HPI: 78 year old male who is being evaluated today for "red rash" on his right leg below the knee.  First noticed yesterday and reports since that time that the rash has spread.  He does report redness, warmth, pain with palpation, discharge that is described as " pus".   He denies any trauma, injury, or bug bites.   ROS: See pertinent positives and negatives per HPI.  Past Medical History:  Diagnosis Date  . AI (aortic insufficiency) 01/11/2018   Trace, noted on ECHO  . Arthritis   . CAD in native artery    Nuclear stress test 10/19: EF 58, normal perfusion, low risk  . Cellulitis   . Dermatitis   . Diabetes mellitus    type II  . Diastolic dysfunction 40/98/1191   Mild, noted on ECHO  . Diverticulosis 01/31/2016   ASCENDING COLON AND CECUM, NOTED ON COLONOSCOPY  . DOE (dyspnea on exertion)   . Edema   . Fatty liver 09/18/2004   Noted on [place] ABD  . History of kidney stones 05/23/2002   Noted on CT Abd  . Hx of colonic polyps 01/31/2016  . Hyperlipidemia   . Hypertension   . Internal hemorrhoids 01/31/2016   Noted on colonoscopy  . Obesity   . OSA (obstructive sleep apnea)    cpap  . Positional vertigo   . Pulmonary hypertension ([place]) 01/11/2018   Mild, noted on ECHO  . Rhinosinusitis   . Skin lesion   . TR (tricuspid regurgitation) 01/11/2018   Trace, noted on ECHO    Past Surgical History:  Procedure Laterality Date  . CARDIAC CATHETERIZATION    .  COLONOSCOPY    . CORONARY STENT PLACEMENT     3 stents /1997  . POLYPECTOMY    . TONSILLECTOMY    . TOTAL KNEE ARTHROPLASTY Right 10/04/2018   Procedure: RIGHT TOTAL KNEE ARTHROPLASTY;  Surgeon: Gaynelle Arabian, MD;  Location: [facility];  Service: Orthopedics;  Laterality: Right;  Adductor Block    Family History  Problem Relation Age of Onset  . Cancer Mother        ? lung cancer  . Heart disease Father   . Heart attack Father   . Heart disease Sister   . Diabetes Sister   . Heart attack Sister   . Heart attack Brother        Current Outpatient Medications:  .  albuterol (PROVENTIL HFA;VENTOLIN HFA) 108 (90 Base) MCG/ACT inhaler, Inhale 2 puffs into the lungs every 6 (six) hours as needed for wheezing or shortness of breath., Disp: 1 Inhaler, Rfl: 0 .  amLODipine (NORVASC) 2.5 MG tablet, TAKE 1 TABLET BY MOUTH EVERY DAY (Patient taking differently: Take 2.5 mg by mouth daily. ), Disp: 90 tablet, Rfl: 1 .  BD PEN NEEDLE NANO U/F 32G X 4 MM MISC, USE AS DIRECTED TWICE A DAY, Disp: 200 each, Rfl: 0 .  doxycycline (VIBRAMYCIN) 100 MG capsule, Take 1 capsule (100 mg total) by mouth 2 (two) times daily., Disp: 20 capsule, Rfl:  0 .  fluticasone (FLONASE) 50 MCG/ACT nasal spray, PLACE 2 SPRAYS INTO BOTH NOSTRILS DAILY AS NEEDED. (Patient taking differently: Place 2 sprays into both nostrils daily as needed for rhinitis. ), Disp: 48 g, Rfl: 2 .  gabapentin (NEURONTIN) 300 MG capsule, Take 1 capsule (300 mg total) by mouth 3 (three) times daily. Take a 300 mg capsule three times a day for two weeks following surgery.Then take a 300 mg capsule two times a day for two weeks. Then take a 300 mg capsule once a day for two weeks. Then discontinue., Disp: 84 capsule, Rfl: 0 .  glipiZIDE (GLUCOTROL XL) 10 MG 24 hr tablet, TAKE 1 TABLET (10 MG TOTAL) BY MOUTH DAILY WITH BREAKFAST., Disp: 90 tablet, Rfl: 0 .  Insulin Glargine (BASAGLAR KWIKPEN) 100 UNIT/ML SOPN, INJECT 0.4 MLS (40 UNITS TOTAL) INTO THE SKIN  DAILY. INJECT 30 TO 35 UNITS AT 10PM AS DIRECTED., Disp: 7 pen, Rfl: 0 .  meclizine (ANTIVERT) 12.5 MG tablet, Take 1 tablet (12.5 mg total) by mouth 3 (three) times daily as needed for dizziness., Disp: 30 tablet, Rfl: 1 .  methocarbamol (ROBAXIN) 500 MG tablet, Take 1 tablet (500 mg total) by mouth every 6 (six) hours as needed for muscle spasms., Disp: 40 tablet, Rfl: 0 .  omeprazole (PRILOSEC) 20 MG capsule, TAKE 1 CAPSULE BY MOUTH EVERY DAY, Disp: 90 capsule, Rfl: 0 .  simvastatin (ZOCOR) 20 MG tablet, TAKE 1 TABLET BY MOUTH EVERYDAY AT BEDTIME, Disp: 90 tablet, Rfl: 0 .  tamsulosin (FLOMAX) 0.4 MG CAPS capsule, Take 0.4 mg by mouth daily., Disp: , Rfl: 11 .  TOVIAZ 8 MG TB24 tablet, Take 8 mg by mouth daily. , Disp: , Rfl:  .  VICTOZA 18 MG/3ML SOPN, INJECT 1.2MG  UNDER THE SKIN EVERY DAY AS DIRECTED (Patient taking differently: Inject 1.2 mg into the skin daily. ), Disp: 15 mL, Rfl: 1  EXAM:  VITALS per patient if applicable:  GENERAL: alert, oriented, appears well and in no acute distress  HEENT: atraumatic, conjunttiva clear, no obvious abnormalities on inspection of external nose and ears  NECK: normal movements of the head and neck  LUNGS: on inspection no signs of respiratory distress, breathing rate appears normal, no obvious gross SOB, gasping or wheezing  CV: no obvious cyanosis  MS: moves all visible extremities without noticeable abnormality  PSYCH/NEURO: pleasant and cooperative, no obvious depression or anxiety, speech and thought processing grossly intact  SKIN: Erythema noted on right lower leg below the knee that radiates to medial aspect of right lower leg.  He does appear to have a small insertion point that is covered in a scab, possibly indicating trauma or insect bite.  No active drainage noted during this exam  ASSESSMENT AND PLAN:  Discussed the following assessment and plan:  Exam consistent with what appears to be cellulitis.  He has had a recent knee  replacement of this leg approximately 2 months ago.  Cellulitis does not appear to be on the knee.  Will prescribe doxycycline 100 mg twice daily x10 days and follow-up early next week  Cellulitis of right lower extremity - Plan: doxycycline (VIBRAMYCIN) 100 MG capsule   I discussed the assessment and treatment plan with the patient. The patient was provided an opportunity to ask questions and all were answered. The patient agreed with the plan and demonstrated an understanding of the instructions.   The patient was advised to call back or seek an in-person evaluation if the symptoms worsen or if  the condition fails to improve as anticipated.   [name], NP

## 2018-12-09 ENCOUNTER — Telehealth: Payer: Self-pay

## 2018-12-09 NOTE — Telephone Encounter (Signed)
Per [name], the patient needs a virtual visit for a follow up on cellulitis on 12/14/2018.  Left message for patient to call back. CRM created.

## 2018-12-10 NOTE — Telephone Encounter (Signed)
Patient called back and I scheduled an appt for 4/21.

## 2018-12-14 ENCOUNTER — Ambulatory Visit (INDEPENDENT_AMBULATORY_CARE_PROVIDER_SITE_OTHER): Payer: Medicare Other | Admitting: Adult Health

## 2018-12-14 ENCOUNTER — Encounter: Payer: Self-pay | Admitting: Adult Health

## 2018-12-14 ENCOUNTER — Other Ambulatory Visit: Payer: Self-pay

## 2018-12-14 DIAGNOSIS — L03115 Cellulitis of right lower limb: Secondary | ICD-10-CM

## 2018-12-14 NOTE — Progress Notes (Signed)
Virtual Visit via Video Note  I connected with Jacob Bishop on 12/14/18 at  9:30 AM EDT by a video enabled telemedicine application and verified that I am speaking with the correct person using two identifiers.  Location patient: home Location provider:work or home office Persons participating in the virtual visit: patient, provider  I discussed the limitations of evaluation and management by telemedicine and the availability of in person appointments. The patient expressed understanding and agreed to proceed.   HPI:   78 year old male who is being evaluated today for follow-up regarding cellulitis on right lower extremity.  1 week ago he was started on doxycycline 100 mg twice daily.  Today he reports that the rash, redness, warmth, and pain has resolved.  He no longer has any issues on his right lower extremity  ROS: See pertinent positives and negatives per HPI.  Past Medical History:  Diagnosis Date  . AI (aortic insufficiency) 01/11/2018   Trace, noted on ECHO  . Arthritis   . CAD in native artery    Nuclear stress test 10/19: EF 58, normal perfusion, low risk  . Cellulitis   . Dermatitis   . Diabetes mellitus    type II  . Diastolic dysfunction 74/25/9563   Mild, noted on ECHO  . Diverticulosis 01/31/2016   ASCENDING COLON AND CECUM, NOTED ON COLONOSCOPY  . DOE (dyspnea on exertion)   . Edema   . Fatty liver 09/18/2004   Noted on [place] ABD  . History of kidney stones 05/23/2002   Noted on CT Abd  . Hx of colonic polyps 01/31/2016  . Hyperlipidemia   . Hypertension   . Internal hemorrhoids 01/31/2016   Noted on colonoscopy  . Obesity   . OSA (obstructive sleep apnea)    cpap  . Positional vertigo   . Pulmonary hypertension ([place]) 01/11/2018   Mild, noted on ECHO  . Rhinosinusitis   . Skin lesion   . TR (tricuspid regurgitation) 01/11/2018   Trace, noted on ECHO    Past Surgical History:  Procedure Laterality Date  . CARDIAC CATHETERIZATION    . COLONOSCOPY     . CORONARY STENT PLACEMENT     3 stents /1997  . POLYPECTOMY    . TONSILLECTOMY    . TOTAL KNEE ARTHROPLASTY Right 10/04/2018   Procedure: RIGHT TOTAL KNEE ARTHROPLASTY;  Surgeon: Gaynelle Arabian, MD;  Location: [facility];  Service: Orthopedics;  Laterality: Right;  Adductor Block    Family History  Problem Relation Age of Onset  . Cancer Mother        ? lung cancer  . Heart disease Father   . Heart attack Father   . Heart disease Sister   . Diabetes Sister   . Heart attack Sister   . Heart attack Brother       Current Outpatient Medications:  .  albuterol (PROVENTIL HFA;VENTOLIN HFA) 108 (90 Base) MCG/ACT inhaler, Inhale 2 puffs into the lungs every 6 (six) hours as needed for wheezing or shortness of breath., Disp: 1 Inhaler, Rfl: 0 .  amLODipine (NORVASC) 2.5 MG tablet, TAKE 1 TABLET BY MOUTH EVERY DAY (Patient taking differently: Take 2.5 mg by mouth daily. ), Disp: 90 tablet, Rfl: 1 .  BD PEN NEEDLE NANO U/F 32G X 4 MM MISC, USE AS DIRECTED TWICE A DAY, Disp: 200 each, Rfl: 0 .  doxycycline (VIBRAMYCIN) 100 MG capsule, Take 1 capsule (100 mg total) by mouth 2 (two) times daily., Disp: 20 capsule, Rfl: 0 .  fluticasone (  FLONASE) 50 MCG/ACT nasal spray, PLACE 2 SPRAYS INTO BOTH NOSTRILS DAILY AS NEEDED. (Patient taking differently: Place 2 sprays into both nostrils daily as needed for rhinitis. ), Disp: 48 g, Rfl: 2 .  gabapentin (NEURONTIN) 300 MG capsule, Take 1 capsule (300 mg total) by mouth 3 (three) times daily. Take a 300 mg capsule three times a day for two weeks following surgery.Then take a 300 mg capsule two times a day for two weeks. Then take a 300 mg capsule once a day for two weeks. Then discontinue., Disp: 84 capsule, Rfl: 0 .  glipiZIDE (GLUCOTROL XL) 10 MG 24 hr tablet, TAKE 1 TABLET (10 MG TOTAL) BY MOUTH DAILY WITH BREAKFAST., Disp: 90 tablet, Rfl: 0 .  Insulin Glargine (BASAGLAR KWIKPEN) 100 UNIT/ML SOPN, INJECT 0.4 MLS (40 UNITS TOTAL) INTO THE SKIN DAILY. INJECT  30 TO 35 UNITS AT 10PM AS DIRECTED., Disp: 7 pen, Rfl: 0 .  meclizine (ANTIVERT) 12.5 MG tablet, Take 1 tablet (12.5 mg total) by mouth 3 (three) times daily as needed for dizziness., Disp: 30 tablet, Rfl: 1 .  methocarbamol (ROBAXIN) 500 MG tablet, Take 1 tablet (500 mg total) by mouth every 6 (six) hours as needed for muscle spasms., Disp: 40 tablet, Rfl: 0 .  omeprazole (PRILOSEC) 20 MG capsule, TAKE 1 CAPSULE BY MOUTH EVERY DAY, Disp: 90 capsule, Rfl: 0 .  simvastatin (ZOCOR) 20 MG tablet, TAKE 1 TABLET BY MOUTH EVERYDAY AT BEDTIME, Disp: 90 tablet, Rfl: 0 .  tamsulosin (FLOMAX) 0.4 MG CAPS capsule, Take 0.4 mg by mouth daily., Disp: , Rfl: 11 .  TOVIAZ 8 MG TB24 tablet, Take 8 mg by mouth daily. , Disp: , Rfl:  .  VICTOZA 18 MG/3ML SOPN, INJECT 1.2MG  UNDER THE SKIN EVERY DAY AS DIRECTED (Patient taking differently: Inject 1.2 mg into the skin daily. ), Disp: 15 mL, Rfl: 1  EXAM:  VITALS per patient if applicable:  GENERAL: alert, oriented, appears well and in no acute distress  HEENT: atraumatic, conjunttiva clear, no obvious abnormalities on inspection of external nose and ears  NECK: normal movements of the head and neck  LUNGS: on inspection no signs of respiratory distress, breathing rate appears normal, no obvious gross SOB, gasping or wheezing  CV: no obvious cyanosis  MS: moves all visible extremities without noticeable abnormality  PSYCH/NEURO: pleasant and cooperative, no obvious depression or anxiety, speech and thought processing grossly intact  SKIN: No signs of cellulitis noted  ASSESSMENT AND PLAN:  Discussed the following assessment and plan:  Cellulitis of right lower extremity  -Has resolved.  Continue with antibiotic therapy until finished.  Follow-up as needed    I discussed the assessment and treatment plan with the patient. The patient was provided an opportunity to ask questions and all were answered. The patient agreed with the plan and demonstrated an  understanding of the instructions.   The patient was advised to call back or seek an in-person evaluation if the symptoms worsen or if the condition fails to improve as anticipated.   [name], NP

## 2018-12-23 ENCOUNTER — Other Ambulatory Visit: Payer: Self-pay | Admitting: Adult Health

## 2018-12-24 NOTE — Telephone Encounter (Signed)
Sent to the pharmacy by e-scribe. 

## 2018-12-29 ENCOUNTER — Other Ambulatory Visit: Payer: Self-pay | Admitting: Endocrinology

## 2018-12-30 ENCOUNTER — Encounter: Payer: Self-pay | Admitting: Internal Medicine

## 2019-01-20 DIAGNOSIS — R3915 Urgency of urination: Secondary | ICD-10-CM | POA: Diagnosis not present

## 2019-01-20 DIAGNOSIS — N401 Enlarged prostate with lower urinary tract symptoms: Secondary | ICD-10-CM | POA: Diagnosis not present

## 2019-01-20 DIAGNOSIS — R3121 Asymptomatic microscopic hematuria: Secondary | ICD-10-CM | POA: Diagnosis not present

## 2019-01-20 DIAGNOSIS — N200 Calculus of kidney: Secondary | ICD-10-CM | POA: Diagnosis not present

## 2019-01-20 LAB — PSA: PSA: 2.79

## 2019-01-26 ENCOUNTER — Other Ambulatory Visit: Payer: Self-pay | Admitting: Adult Health

## 2019-01-27 NOTE — Telephone Encounter (Signed)
Sent to the pharmacy by e-scribe. 

## 2019-01-29 ENCOUNTER — Other Ambulatory Visit: Payer: Self-pay | Admitting: Endocrinology

## 2019-01-29 NOTE — Telephone Encounter (Signed)
Please refill x 1 F/u is due  

## 2019-02-14 ENCOUNTER — Telehealth: Payer: Self-pay | Admitting: Adult Health

## 2019-02-14 NOTE — Telephone Encounter (Signed)
Patient dropped off disability placard  Call for pick up at: [phone]  Disposition: Dr's Folder

## 2019-02-16 NOTE — Telephone Encounter (Signed)
Form Received

## 2019-02-16 NOTE — Telephone Encounter (Signed)
Placed in Cory's folder 

## 2019-02-18 NOTE — Telephone Encounter (Signed)
Patient picked up form  No form fee

## 2019-02-18 NOTE — Telephone Encounter (Signed)
Pt notified to pick up at the front desk and that the office closes at 4 pm.  Copy sent to scan.  Nothing further needed.

## 2019-02-26 ENCOUNTER — Other Ambulatory Visit: Payer: Self-pay | Admitting: Endocrinology

## 2019-02-26 NOTE — Telephone Encounter (Signed)
Please refill x 1 F/u is due  

## 2019-03-03 ENCOUNTER — Encounter: Payer: Self-pay | Admitting: Family Medicine

## 2019-03-30 ENCOUNTER — Other Ambulatory Visit: Payer: Self-pay | Admitting: Endocrinology

## 2019-03-30 NOTE — Telephone Encounter (Signed)
LOV 07/01/17. NS'd for f/u appt and failed to schedule a future appt. Please advise how to proceed with this refill request

## 2019-04-07 ENCOUNTER — Ambulatory Visit: Payer: Medicare Other | Admitting: Nurse Practitioner

## 2019-04-19 ENCOUNTER — Encounter: Payer: Self-pay | Admitting: Adult Health

## 2019-04-19 ENCOUNTER — Other Ambulatory Visit: Payer: Self-pay

## 2019-04-19 ENCOUNTER — Ambulatory Visit (INDEPENDENT_AMBULATORY_CARE_PROVIDER_SITE_OTHER): Payer: Medicare Other | Admitting: Adult Health

## 2019-04-19 DIAGNOSIS — G4733 Obstructive sleep apnea (adult) (pediatric): Secondary | ICD-10-CM

## 2019-04-19 NOTE — Patient Instructions (Addendum)
Continue on CPAP at bedtime Keep up the good work Order for new CPAP machine CPAP download Do not drive if sleepy Work on healthy weight Follow-up with Dr. [name] in 1 year and as needed

## 2019-04-19 NOTE — Progress Notes (Signed)
Virtual Visit via Telephone Note  I connected with Jacob Bishop on 04/19/19 at  2:00 PM EDT by telephone and verified that I am speaking with the correct person using two identifiers.  Location: Patient: Home  Provider: Office    I discussed the limitations, risks, security and privacy concerns of performing an evaluation and management service by telephone and the availability of in person appointments. I also discussed with the patient that there may be a patient responsible charge related to this service. The patient expressed understanding and agreed to proceed.   History of Present Illness: 78 year old male seen for pulmonary consult August 2019 for pulmonary surgical clearance.  Patient has underlying obstructive sleep apnea is compliant on CPAP.  He also had dyspnea on exertion was felt to be multifactorial with mild pulmonary hypertension likely secondary to diastolic dysfunction with underlying obesity.  There was mild to moderate restriction on spirometry. Medical history   Today's tele-visit is a follow-up for sleep apnea. Patient is a 78 year old male that is on nocturnal CPAP.  He says he is very compliant with his CPAP.  Wears it each night for at least 6 hrs . Marland Kitchen  Feel that he benefits from CPAP and feels rested without any significant daytime sleepiness.  He is requesting a new CPAP machine as his is very old (>12 yrs  Old )  He does not have an SD card.  Says he cannot go without his CPAP at night.  Says breathing is doing okay. No increased dyspnea. No cough .  Owns restaurant, helps with bills.    Observations/Objective: 2D echo May 2019 EF 60 to 123456, grade 1 diastolic dysfunction, pulmonary artery pressure 41 mmHg.   Assessment and Plan: Obstructive sleep apnea well-controlled on CPAP.  Patient reports excellent compliance.  Says he cannot sleep without it.  His machine is greater than 73 years old.  Will need a new machine.  Once new machine is received will need CPAP  download.  Patient is continue on current pressure settings.  Encouraged on healthy weight loss and continued compliance  Obesity- wt loss   Plan  Patient Instructions  Continue on CPAP at bedtime Keep up the good work Order for new CPAP machine CPAP download Do not drive if sleepy Work on healthy weight Follow-up with Dr. [name] in 1 year and as needed     Follow Up Instructions: Follow-up in 1 year and as needed   I discussed the assessment and treatment plan with the patient. The patient was provided an opportunity to ask questions and all were answered. The patient agreed with the plan and demonstrated an understanding of the instructions.   The patient was advised to call back or seek an in-person evaluation if the symptoms worsen or if the condition fails to improve as anticipated.  I provided 22  minutes of non-face-to-face time during this encounter.   [name], NP

## 2019-04-20 ENCOUNTER — Telehealth: Payer: Self-pay | Admitting: Adult Health

## 2019-04-20 NOTE — Telephone Encounter (Signed)
Patient seen yesterday 04/19/2019 for follow with TP via telephone visit Replacement CPAP to be ordered through [facility], however they do not have a copy of patient's sleep study and need this before the order can be placed.  Patient had sleep study in 2006 at [facility], ordered by Dr. Gwenette Greet - sleep study is not scanned into Epic.  Called the sleep center, their records only go back 5 years Called medical records @ [id number] and spoke with Jeani Hawking who will order the chart from the warehouse, scan in the sleep study and send me a message once this is completed  Called spoke with patient's spouse Leda Gauze (dpr on file) to explain the above.  Leda Gauze will explain this to the patient and they will call me with any questions.   TP is aware  Will hold in my box and await message from medical records

## 2019-04-26 NOTE — Telephone Encounter (Signed)
[facility] is returning call on this pt she said that you we looking for a pressure setting and the only thing she could find was 19 and that was dated back 2010 any question they can be reaced @ [phone].Jacob Bishop

## 2019-04-27 NOTE — Addendum Note (Signed)
Addended by: Parke Poisson E on: 04/27/2019 01:49 PM   Modules accepted: Orders

## 2019-04-27 NOTE — Telephone Encounter (Signed)
Order for new CPAP machine placed with a pressure setting at 19cm.  Adjustments to be made pending 1st download report per TP.  Patient is aware  Nothing further needed at this time; will sign off.

## 2019-04-28 ENCOUNTER — Other Ambulatory Visit: Payer: Self-pay | Admitting: Adult Health

## 2019-04-28 NOTE — Telephone Encounter (Signed)
Sent to the pharmacy by e-scribe for 90 days.  I have scheduled the pt for a cpx.

## 2019-05-01 ENCOUNTER — Other Ambulatory Visit: Payer: Self-pay | Admitting: Endocrinology

## 2019-05-02 NOTE — Telephone Encounter (Signed)
1.  Please schedule f/u appt 2.  Then please refill x 1, pending that appt.  

## 2019-05-04 NOTE — Telephone Encounter (Signed)
Please contact patient to schedule follow up then route back so RX can be sent. Thank you.

## 2019-05-04 NOTE — Telephone Encounter (Signed)
LVM to Schedule °

## 2019-05-05 NOTE — Telephone Encounter (Signed)
Called and spoke with patient, he states that he does not see Dr Loanne Drilling any longer and that Dr [name] manages his Diabetes.  I let patient know that [facility] sent a request for refill to [place] and that he would in this instance have Dr Carlisle Cater call that in for him since patient no longer sees Dr Loanne Drilling

## 2019-05-09 ENCOUNTER — Telehealth: Payer: Self-pay | Admitting: Adult Health

## 2019-05-09 NOTE — Telephone Encounter (Signed)
I will call pt.  

## 2019-05-09 NOTE — Telephone Encounter (Signed)
Pt was calling to check the status of replacement cpap order.  Order was placed 9/2 and I got confirmation back 9/3 it was received.  Called [facility] customer service & spoke to [place].  She said it looks like order was sent to [facility] on 9/4 for pt scheduling.  She transferred me to them & I left vm requesting status.  I called pt & made him aware of status.  Told him I would call him when they call me back.

## 2019-05-10 NOTE — Telephone Encounter (Signed)
I haven't heard back from [facility] at [facility].  Museum/gallery conservator and spoke to [place].  She saw the same as Minna Merritts yesterday - [facility] has and status is for pt scheduling.  Dara sent a message to Amy in [facility] with no response.  She is going to e-mail the team and have someone to call me with status.

## 2019-05-12 NOTE — Telephone Encounter (Signed)
I have not heard back from [facility].  I called pt & he hasn't heard from them either.  I'm going to send community message to [place] and ask about it.

## 2019-05-12 NOTE — Telephone Encounter (Signed)
Have you received an update on this?

## 2019-05-13 NOTE — Telephone Encounter (Signed)
Message received from [place] states pt had appt on 9/16 at 11:00 at [facility] office.  I replied back to her that when I spoke to pt yesterday he had not spoken to anyone from [facility] and he was upset.  She replied she would send a message to [facility] to reschedule.  I told her to let me know their response because someone was to have called me Monday and again on Tuesday and I haven't spoken to anyone either.

## 2019-05-16 NOTE — Telephone Encounter (Signed)
Message received from [facility] -     I found out that RT Support was able t get this patient scheduled for 9/22 @ 10am for PAP set up.    I spoke to pt & verified he is aware of appt.  Nothing further needed.

## 2019-05-16 NOTE — Telephone Encounter (Signed)
LMTCB with [facility]

## 2019-05-16 NOTE — Telephone Encounter (Signed)
I just spoke to [place] at [place].  She is going to follow up on this & cc manager to get him involved.

## 2019-06-17 ENCOUNTER — Telehealth: Payer: Self-pay | Admitting: Pulmonary Disease

## 2019-06-17 ENCOUNTER — Other Ambulatory Visit: Payer: Self-pay | Admitting: Adult Health

## 2019-06-17 NOTE — Telephone Encounter (Signed)
Called spoke with patient who reported that [facility] does not have the CPAP mask brand that he's used to wearing and the mask that he was given with his new machine is very uncomfortable and he feels that he will be unable to wear it.  Was told by [facility] that insurance will not pay for another mask so early.  Patient stated he saw some masks online but is unsure what website to get it from.  Patient is requesting that we contact [facility] for him to help.  Patient is aware it will be next week before we get back with him.  Called [facility] and spoke with Sonia Baller.  She could only find 1 reference to a mask type in pt's chart but even though the store ([facility]) does not keep them in stock, they can still order it for pt and have it shipped to the patient's home.  Sonia Baller will investigate further and call us back on Tuesday (she is off on Monday).  Will hold in triage for follow up on Tuesday.

## 2019-06-21 NOTE — Telephone Encounter (Signed)
30 DAY SUPPLY SENT TO THE PHARMACY BY E-SCRIBE. 

## 2019-06-24 ENCOUNTER — Telehealth: Payer: Self-pay | Admitting: Adult Health

## 2019-06-24 NOTE — Telephone Encounter (Signed)
  Lisabeth Devoid, CMA        Janett Billow, I wanted to follow up with you. I spoke to our RT Team and we are shipping him a Mirage FFM, which is the previous mask that he likes. This is not a mask that we stock, so every time he needs one, it will have to be ordered and shipped to him.    Called spoke with patient who reported that [facility] contacted him yesterday about his mask being shipped.  Patient thanked [place] for our help and nothing further is needed.  Will sign off.

## 2019-06-24 NOTE — Telephone Encounter (Signed)
LMVM for the patient to contact the office and reschedule his appointment with [name]. He will be out of the office that day.

## 2019-06-24 NOTE — Telephone Encounter (Signed)
LMTCB with [facility].

## 2019-06-28 ENCOUNTER — Ambulatory Visit (INDEPENDENT_AMBULATORY_CARE_PROVIDER_SITE_OTHER): Payer: Medicare Other

## 2019-06-28 ENCOUNTER — Other Ambulatory Visit: Payer: Self-pay

## 2019-06-28 DIAGNOSIS — Z23 Encounter for immunization: Secondary | ICD-10-CM

## 2019-06-30 ENCOUNTER — Encounter: Payer: Self-pay | Admitting: Adult Health

## 2019-06-30 ENCOUNTER — Encounter: Payer: Medicare Other | Admitting: Adult Health

## 2019-06-30 ENCOUNTER — Ambulatory Visit: Payer: Medicare Other

## 2019-07-05 ENCOUNTER — Other Ambulatory Visit: Payer: Self-pay | Admitting: Adult Health

## 2019-07-07 NOTE — Telephone Encounter (Signed)
Sent to the pharmacy by e-scribe. 

## 2019-07-14 ENCOUNTER — Other Ambulatory Visit: Payer: Self-pay

## 2019-07-14 ENCOUNTER — Encounter: Payer: Self-pay | Admitting: Adult Health

## 2019-07-14 ENCOUNTER — Ambulatory Visit (INDEPENDENT_AMBULATORY_CARE_PROVIDER_SITE_OTHER): Payer: Medicare Other | Admitting: Adult Health

## 2019-07-14 VITALS — BP 120/70 | Temp 98.3°F | Wt 267.0 lb

## 2019-07-14 DIAGNOSIS — E1165 Type 2 diabetes mellitus with hyperglycemia: Secondary | ICD-10-CM

## 2019-07-14 DIAGNOSIS — S0120XA Unspecified open wound of nose, initial encounter: Secondary | ICD-10-CM | POA: Diagnosis not present

## 2019-07-14 DIAGNOSIS — B027 Disseminated zoster: Secondary | ICD-10-CM

## 2019-07-14 LAB — CBC WITH DIFFERENTIAL/PLATELET
Basophils Absolute: 0 10*3/uL (ref 0.0–0.1)
Basophils Relative: 0.2 % (ref 0.0–3.0)
Eosinophils Absolute: 0.2 10*3/uL (ref 0.0–0.7)
Eosinophils Relative: 2.6 % (ref 0.0–5.0)
HCT: 42.6 % (ref 39.0–52.0)
Hemoglobin: 14.5 g/dL (ref 13.0–17.0)
Lymphocytes Relative: 32.3 % (ref 12.0–46.0)
Lymphs Abs: 2.3 10*3/uL (ref 0.7–4.0)
MCHC: 34.1 g/dL (ref 30.0–36.0)
MCV: 94.9 fl (ref 78.0–100.0)
Monocytes Absolute: 0.6 10*3/uL (ref 0.1–1.0)
Monocytes Relative: 8.5 % (ref 3.0–12.0)
Neutro Abs: 4 10*3/uL (ref 1.4–7.7)
Neutrophils Relative %: 56.4 % (ref 43.0–77.0)
Platelets: 118 10*3/uL — ABNORMAL LOW (ref 150.0–400.0)
RBC: 4.49 Mil/uL (ref 4.22–5.81)
RDW: 13.7 % (ref 11.5–15.5)
WBC: 7.1 10*3/uL (ref 4.0–10.5)

## 2019-07-14 LAB — HEMOGLOBIN A1C: Hgb A1c MFr Bld: 6.5 % (ref 4.6–6.5)

## 2019-07-14 LAB — BASIC METABOLIC PANEL
BUN: 12 mg/dL (ref 6–23)
CO2: 29 mEq/L (ref 19–32)
Calcium: 9.2 mg/dL (ref 8.4–10.5)
Chloride: 106 mEq/L (ref 96–112)
Creatinine, Ser: 0.89 mg/dL (ref 0.40–1.50)
GFR: 83.12 mL/min (ref >60.00)
Glucose, Bld: 117 mg/dL — ABNORMAL HIGH (ref 70–99)
Potassium: 4.4 mEq/L (ref 3.5–5.1)
Sodium: 145 mEq/L (ref 135–145)

## 2019-07-14 MED ORDER — VALACYCLOVIR HCL 1 G PO TABS: 1000.0000 mg | ORAL_TABLET | Freq: Three times a day (TID) | ORAL | 0 refills | Status: AC

## 2019-07-14 MED ORDER — DOXYCYCLINE HYCLATE 100 MG PO CAPS: 100.0000 mg | ORAL_CAPSULE | Freq: Two times a day (BID) | ORAL | 0 refills | Status: DC

## 2019-07-14 NOTE — Progress Notes (Signed)
Subjective:    Patient ID: Jacob Bishop, male    DOB: 22-Feb-1941, 78 y.o.   MRN: [id number]  HPI 78 year old male who is being evaluated today for multiple issues.  His first issue is that of a wound across the bridge of his nose.  He reports that this wound was caused from his CPAP mask.  He was finally able to get a new CPAP mask that does not put as much pressure on the bridge of his nose.  Unfortunately the wound has not been healing quickly.  He denies drainage from this wound.  It is sore to touch.  Additionally he reports that he has developed a rash on his left flank.  He first noticed this rash approximately 2 to 3 days ago.  Rash is itchy.  He has noticed some yellow discharge on his T-shirts from this rash.  Denies pain   He would also like to have his A1c checked.  He is currently prescribed Basaglar 40 units nightly, glipizide 10 g extended release daily, and Victoza 1.2 mg.  Does not check his blood sugars on a routine basis.  He reports that he has many episodes of dizziness throughout the day.  Is not exacerbated by changes in position.  Lab Results  Component Value Date   HGBA1C 6.5 (H) 09/27/2018    Review of Systems See HPI   Past Medical History:  Diagnosis Date  . AI (aortic insufficiency) 01/11/2018   Trace, noted on ECHO  . Arthritis   . CAD in native artery    Nuclear stress test 10/19: EF 58, normal perfusion, low risk  . Cellulitis   . Dermatitis   . Diabetes mellitus    type II  . Diastolic dysfunction 123456   Mild, noted on ECHO  . Diverticulosis 01/31/2016   ASCENDING COLON AND CECUM, NOTED ON COLONOSCOPY  . DOE (dyspnea on exertion)   . Edema   . Fatty liver 09/18/2004   Noted on [place] ABD  . History of kidney stones 05/23/2002   Noted on CT Abd  . Hx of colonic polyps 01/31/2016  . Hyperlipidemia   . Hypertension   . Internal hemorrhoids 01/31/2016   Noted on colonoscopy  . Obesity   . OSA (obstructive sleep apnea)    cpap  .  Positional vertigo   . Pulmonary hypertension ([place]) 01/11/2018   Mild, noted on ECHO  . Rhinosinusitis   . Skin lesion   . TR (tricuspid regurgitation) 01/11/2018   Trace, noted on ECHO    Social History   Socioeconomic History  . Marital status: Married    Spouse name: Not on file  . Number of children: Not on file  . Years of education: Not on file  . Highest education level: Not on file  Occupational History  . Occupation: Financial controller, Health and safety inspector: [employer]  Social Needs  . Financial resource strain: Not on file  . Food insecurity    Worry: Not on file    Inability: Not on file  . Transportation needs    Medical: Not on file    Non-medical: Not on file  Tobacco Use  . Smoking status: Former Smoker    Packs/day: 1.00    Years: 20.00    Pack years: 20.00    Types: Cigarettes    Quit date: 01/10/1996    Years since quitting: 23.5  . Smokeless tobacco: Never Used  Substance and Sexual Activity  . Alcohol use: No  .  Drug use: No  . Sexual activity: Not on file  Lifestyle  . Physical activity    Days per week: Not on file    Minutes per session: Not on file  . Stress: Not on file  Relationships  . Social Herbalist on phone: Not on file    Gets together: Not on file    Attends religious service: Not on file    Active member of club or organization: Not on file    Attends meetings of clubs or organizations: Not on file    Relationship status: Not on file  . Intimate partner violence    Fear of current or ex partner: Not on file    Emotionally abused: Not on file    Physically abused: Not on file    Forced sexual activity: Not on file  Other Topics Concern  . Not on file  Social History Narrative   Grew up in [place]. Mother was [place], Father [place].   He works daily - owns a [employer]    Married for 30+ years   Has a daughter who lives in [place]     Past Surgical History:  Procedure  Laterality Date  . CARDIAC CATHETERIZATION    . COLONOSCOPY    . CORONARY STENT PLACEMENT     3 stents /1997  . POLYPECTOMY    . TONSILLECTOMY    . TOTAL KNEE ARTHROPLASTY Right 10/04/2018   Procedure: RIGHT TOTAL KNEE ARTHROPLASTY;  Surgeon: Gaynelle Arabian, MD;  Location: [facility];  Service: Orthopedics;  Laterality: Right;  Adductor Block    Family History  Problem Relation Age of Onset  . Cancer Mother        ? lung cancer  . Heart disease Father   . Heart attack Father   . Heart disease Sister   . Diabetes Sister   . Heart attack Sister   . Heart attack Brother     Allergies  Allergen Reactions  . Lipitor [Atorvastatin Calcium] Other (See Comments)    Muscle soreness  . Oxycodone Anxiety    Causes patient to become shaky and dizzy. Unable to tolerate.    Current Outpatient Medications on File Prior to Visit  Medication Sig Dispense Refill  . albuterol (PROVENTIL HFA;VENTOLIN HFA) 108 (90 Base) MCG/ACT inhaler Inhale 2 puffs into the lungs every 6 (six) hours as needed for wheezing or shortness of breath. 1 Inhaler 0  . amLODipine (NORVASC) 2.5 MG tablet TAKE 1 TABLET BY MOUTH EVERY DAY 90 tablet 0  . BD PEN NEEDLE NANO U/F 32G X 4 MM MISC USE AS DIRECTED TWICE A DAY 200 each 0  . fluticasone (FLONASE) 50 MCG/ACT nasal spray PLACE 2 SPRAYS INTO BOTH NOSTRILS DAILY AS NEEDED. (Patient taking differently: Place 2 sprays into both nostrils daily as needed for rhinitis. ) 48 g 2  . glipiZIDE (GLUCOTROL XL) 10 MG 24 hr tablet TAKE 1 TABLET (10 MG TOTAL) BY MOUTH DAILY WITH BREAKFAST. 90 tablet 0  . Insulin Glargine (BASAGLAR KWIKPEN) 100 UNIT/ML SOPN INJECT 40 UNITS UNDER THE SKIN EVERY NIGHT AT 10PM. **MUST BE SEEN IN OFFICE FOR FURTHER REFILLS** 12 mL 0  . meclizine (ANTIVERT) 12.5 MG tablet Take 1 tablet (12.5 mg total) by mouth 3 (three) times daily as needed for dizziness. 30 tablet 1  . omeprazole (PRILOSEC) 20 MG capsule TAKE 1 CAPSULE BY MOUTH EVERY DAY 90 capsule 0  .  simvastatin (ZOCOR) 20 MG tablet TAKE  1 TABLET BY MOUTH EVERYDAY AT BEDTIME 90 tablet 0  . tamsulosin (FLOMAX) 0.4 MG CAPS capsule Take 1 capsule by mouth 2 (two) times daily.    . TOVIAZ 8 MG TB24 tablet Take 8 mg by mouth daily.     Marland Kitchen VICTOZA 18 MG/3ML SOPN INJECT 1.2MG  UNDER THE SKIN EVERY DAY AS DIRECTED (Patient taking differently: Inject 1.2 mg into the skin daily. ) 15 mL 1   No current facility-administered medications on file prior to visit.     BP 120/70   Temp 98.3 F (36.8 C) (Temporal)   Wt 267 lb (121.1 kg)   BMI 37.24 kg/m       Objective:   Physical Exam Vitals signs and nursing note reviewed.  Constitutional:      Appearance: Normal appearance. He is obese.  Skin:    General: Skin is warm and dry.     Findings: Rash present.     Comments: He does have a small wound across the bridge of his nose with localized erythema and black eschar tissue.  Vesicular rash noted to the left flank that does not pass midline.  No active drainage noted.  There is some localized erythema throughout.  Neurological:     General: No focal deficit present.     Mental Status: He is alert and oriented to person, place, and time.  Psychiatric:        Mood and Affect: Mood normal.        Behavior: Behavior normal.        Thought Content: Thought content normal.        Judgment: Judgment normal.       Assessment & Plan:   1. Disseminated herpes zoster - Follow up in one week for wound check  - valACYclovir (VALTREX) 1000 MG tablet; Take 1 tablet (1,000 mg total) by mouth 3 (three) times daily for 7 days.  Dispense: 21 tablet; Refill: 0 - doxycycline (VIBRAMYCIN) 100 MG capsule; Take 1 capsule (100 mg total) by mouth 2 (two) times daily.  Dispense: 14 capsule; Refill: 0  2. Uncontrolled type 2 diabetes mellitus with hyperglycemia (HCC) -Dizziness possibly due to low blood sugars.  Consider change in doses of medication. - Basic Metabolic Panel - Hemoglobin A1c - CBC with  Differential/Platelet  3. Open wound of nose, unspecified open wound type, initial encounter - follow up in one week for wound check  - doxycycline (VIBRAMYCIN) 100 MG capsule; Take 1 capsule (100 mg total) by mouth 2 (two) times daily.  Dispense: 14 capsule; Refill: 0 - CBC with Differential/Platelet   [name], NP

## 2019-07-14 NOTE — Patient Instructions (Signed)
I have prescribed Doxycycline and Valtrex to help with your wound and likely shingles.   Please follow up next week   I will follow up with you regarding your blood work

## 2019-07-20 ENCOUNTER — Other Ambulatory Visit: Payer: Self-pay

## 2019-07-20 ENCOUNTER — Encounter: Payer: Self-pay | Admitting: Adult Health

## 2019-07-20 ENCOUNTER — Ambulatory Visit (INDEPENDENT_AMBULATORY_CARE_PROVIDER_SITE_OTHER): Payer: Medicare Other | Admitting: Adult Health

## 2019-07-20 VITALS — BP 126/60 | Temp 97.6°F | Wt 268.0 lb

## 2019-07-20 DIAGNOSIS — B027 Disseminated zoster: Secondary | ICD-10-CM | POA: Diagnosis not present

## 2019-07-20 DIAGNOSIS — S0120XA Unspecified open wound of nose, initial encounter: Secondary | ICD-10-CM

## 2019-07-20 MED ORDER — DOXYCYCLINE HYCLATE 100 MG PO CAPS: 100.0000 mg | ORAL_CAPSULE | Freq: Two times a day (BID) | ORAL | 0 refills | Status: DC

## 2019-07-20 NOTE — Progress Notes (Signed)
Subjective:    Patient ID: Jacob Bishop, male    DOB: [birth year], 78 y.o.   MRN: [id number]  HPI 78 year old male who is being evaluated today for follow-up regarding shingles rash on left flank that was complicated by an apparent cellulitic infection. He also had a  wound on the bridge of his nose from a CPAP mask.   He was started on doxycycline as well as Valtrex.  Today he reports "I think I am healing up pretty good".  Continues to endorse itchiness on his left flank but it is not as painful and less redness and warmth.  Review of Systems See HPI   Past Medical History:  Diagnosis Date  . AI (aortic insufficiency) 01/11/2018   Trace, noted on ECHO  . Arthritis   . CAD in native artery    Nuclear stress test 10/19: EF 58, normal perfusion, low risk  . Cellulitis   . Dermatitis   . Diabetes mellitus    type II  . Diastolic dysfunction 123456   Mild, noted on ECHO  . Diverticulosis 01/31/2016   ASCENDING COLON AND CECUM, NOTED ON COLONOSCOPY  . DOE (dyspnea on exertion)   . Edema   . Fatty liver 09/18/2004   Noted on [place] ABD  . History of kidney stones 05/23/2002   Noted on CT Abd  . Hx of colonic polyps 01/31/2016  . Hyperlipidemia   . Hypertension   . Internal hemorrhoids 01/31/2016   Noted on colonoscopy  . Obesity   . OSA (obstructive sleep apnea)    cpap  . Positional vertigo   . Pulmonary hypertension ([place]) 01/11/2018   Mild, noted on ECHO  . Rhinosinusitis   . Skin lesion   . TR (tricuspid regurgitation) 01/11/2018   Trace, noted on ECHO    Social History   Socioeconomic History  . Marital status: Married    Spouse name: Not on file  . Number of children: Not on file  . Years of education: Not on file  . Highest education level: Not on file  Occupational History  . Occupation: Financial controller, Health and safety inspector: [employer]  Social Needs  . Financial resource strain: Not on file  . Food insecurity    Worry: Not on file   Inability: Not on file  . Transportation needs    Medical: Not on file    Non-medical: Not on file  Tobacco Use  . Smoking status: Former Smoker    Packs/day: 1.00    Years: 20.00    Pack years: 20.00    Types: Cigarettes    Quit date: 01/10/1996    Years since quitting: 23.5  . Smokeless tobacco: Never Used  Substance and Sexual Activity  . Alcohol use: No  . Drug use: No  . Sexual activity: Not on file  Lifestyle  . Physical activity    Days per week: Not on file    Minutes per session: Not on file  . Stress: Not on file  Relationships  . Social Herbalist on phone: Not on file    Gets together: Not on file    Attends religious service: Not on file    Active member of club or organization: Not on file    Attends meetings of clubs or organizations: Not on file    Relationship status: Not on file  . Intimate partner violence    Fear of current or ex partner: Not on file  Emotionally abused: Not on file    Physically abused: Not on file    Forced sexual activity: Not on file  Other Topics Concern  . Not on file  Social History Narrative   Grew up in [place]. Mother was [place], Father [place].   He works daily - owns a [employer]    Married for 30+ years   Has a daughter who lives in [place]     Past Surgical History:  Procedure Laterality Date  . CARDIAC CATHETERIZATION    . COLONOSCOPY    . CORONARY STENT PLACEMENT     3 stents /1997  . POLYPECTOMY    . TONSILLECTOMY    . TOTAL KNEE ARTHROPLASTY Right 10/04/2018   Procedure: RIGHT TOTAL KNEE ARTHROPLASTY;  Surgeon: Gaynelle Arabian, MD;  Location: [facility];  Service: Orthopedics;  Laterality: Right;  Adductor Block    Family History  Problem Relation Age of Onset  . Cancer Mother        ? lung cancer  . Heart disease Father   . Heart attack Father   . Heart disease Sister   . Diabetes Sister   . Heart attack Sister   . Heart attack Brother     Allergies   Allergen Reactions  . Lipitor [Atorvastatin Calcium] Other (See Comments)    Muscle soreness  . Oxycodone Anxiety    Causes patient to become shaky and dizzy. Unable to tolerate.    Current Outpatient Medications on File Prior to Visit  Medication Sig Dispense Refill  . albuterol (PROVENTIL HFA;VENTOLIN HFA) 108 (90 Base) MCG/ACT inhaler Inhale 2 puffs into the lungs every 6 (six) hours as needed for wheezing or shortness of breath. 1 Inhaler 0  . amLODipine (NORVASC) 2.5 MG tablet TAKE 1 TABLET BY MOUTH EVERY DAY 90 tablet 0  . BD PEN NEEDLE NANO U/F 32G X 4 MM MISC USE AS DIRECTED TWICE A DAY 200 each 0  . doxycycline (VIBRAMYCIN) 100 MG capsule Take 1 capsule (100 mg total) by mouth 2 (two) times daily. 14 capsule 0  . fluticasone (FLONASE) 50 MCG/ACT nasal spray PLACE 2 SPRAYS INTO BOTH NOSTRILS DAILY AS NEEDED. (Patient taking differently: Place 2 sprays into both nostrils daily as needed for rhinitis. ) 48 g 2  . glipiZIDE (GLUCOTROL XL) 10 MG 24 hr tablet TAKE 1 TABLET (10 MG TOTAL) BY MOUTH DAILY WITH BREAKFAST. 90 tablet 0  . Insulin Glargine (BASAGLAR KWIKPEN) 100 UNIT/ML SOPN INJECT 40 UNITS UNDER THE SKIN EVERY NIGHT AT 10PM. **MUST BE SEEN IN OFFICE FOR FURTHER REFILLS** 12 mL 0  . meclizine (ANTIVERT) 12.5 MG tablet Take 1 tablet (12.5 mg total) by mouth 3 (three) times daily as needed for dizziness. 30 tablet 1  . omeprazole (PRILOSEC) 20 MG capsule TAKE 1 CAPSULE BY MOUTH EVERY DAY 90 capsule 0  . simvastatin (ZOCOR) 20 MG tablet TAKE 1 TABLET BY MOUTH EVERYDAY AT BEDTIME 90 tablet 0  . tamsulosin (FLOMAX) 0.4 MG CAPS capsule Take 1 capsule by mouth 2 (two) times daily.    . TOVIAZ 8 MG TB24 tablet Take 8 mg by mouth daily.     . valACYclovir (VALTREX) 1000 MG tablet Take 1 tablet (1,000 mg total) by mouth 3 (three) times daily for 7 days. 21 tablet 0  . VICTOZA 18 MG/3ML SOPN INJECT 1.2MG  UNDER THE SKIN EVERY DAY AS DIRECTED (Patient taking differently: Inject 1.2 mg into the  skin daily. ) 15 mL 1   No  current facility-administered medications on file prior to visit.     There were no vitals taken for this visit.      Objective:   Physical Exam Vitals signs and nursing note reviewed.  Constitutional:      Appearance: Normal appearance. He is obese.  Cardiovascular:     Rate and Rhythm: Normal rate and regular rhythm.     Pulses: Normal pulses.     Heart sounds: Normal heart sounds.  Pulmonary:     Effort: Pulmonary effort is normal.     Breath sounds: Normal breath sounds.  Skin:    General: Skin is warm and dry.     Findings: Erythema, rash and wound present.     Comments: Wound across the bridge of his nose appears to be well-healing.  Continues to have some mild erythema, no longer has black eschar tissue.  Significant improvement in shingles rash as well as cellulitic infection to the left flank.  No active lesions noted.  Continues to have some areas of redness and warmth.  Neurological:     Mental Status: He is alert.       Assessment & Plan:  1. Disseminated herpes zoster -Both wounds appear to be healing well.  We will add another week of doxycycline to promote complete resolution.  He was advised to follow-up next week if he continues to have signs of infection - doxycycline (VIBRAMYCIN) 100 MG capsule; Take 1 capsule (100 mg total) by mouth 2 (two) times daily.  Dispense: 14 capsule; Refill: 0  2. Open wound of nose, unspecified open wound type, initial encounter  - doxycycline (VIBRAMYCIN) 100 MG capsule; Take 1 capsule (100 mg total) by mouth 2 (two) times daily.  Dispense: 14 capsule; Refill: 0  [name], NP

## 2019-07-22 ENCOUNTER — Ambulatory Visit: Payer: Self-pay | Admitting: *Deleted

## 2019-07-22 ENCOUNTER — Telehealth: Payer: Self-pay | Admitting: Adult Health

## 2019-07-22 NOTE — Telephone Encounter (Signed)
Patient is calling because he had shingles. And it has returned. Patient is wanting to know what he should do?   Returned call to patient. He had a visit with his pcp on 07/20/2019 and was prescribed doxycycline for shingles outbreak.  Pt stated today the rash is draining and he wanted to know if he should be put on a different antibiotic. He is advised to go ahead and start taking the new prescription and if he thinks that is not helping to notify his pcp.  He denies fever. He voiced understanding. He stated that per his pcp, the rash was looking better in the office. Routing to [facility] at [place] for review.    Reason for Disposition . Health Information question, no triage required and triager able to answer question  Answer Assessment - Initial Assessment Questions 1. REASON FOR CALL or QUESTION: "What is your reason for calling today?" or "How can I best help you?" or "What question do you have that I can help answer?"     Question regarding shingles.  Protocols used: INFORMATION ONLY CALL-A-AH

## 2019-07-26 NOTE — Telephone Encounter (Signed)
FYI

## 2019-08-17 ENCOUNTER — Other Ambulatory Visit: Payer: Self-pay | Admitting: Adult Health

## 2019-08-17 ENCOUNTER — Telehealth (INDEPENDENT_AMBULATORY_CARE_PROVIDER_SITE_OTHER): Payer: Medicare Other | Admitting: Adult Health

## 2019-08-17 ENCOUNTER — Other Ambulatory Visit: Payer: Self-pay

## 2019-08-17 DIAGNOSIS — J3489 Other specified disorders of nose and nasal sinuses: Secondary | ICD-10-CM | POA: Diagnosis not present

## 2019-08-17 DIAGNOSIS — H8113 Benign paroxysmal vertigo, bilateral: Secondary | ICD-10-CM

## 2019-08-17 NOTE — Telephone Encounter (Signed)
Sent to the pharmacy by e-scribe. 

## 2019-08-17 NOTE — Progress Notes (Signed)
Virtual Visit via Video Note  I connected with Jacob Bishop on 08/17/19 at  8:00 AM EST by a video enabled telemedicine application and verified that I am speaking with the correct person using two identifiers.  Location patient: home Location provider:work or home office Persons participating in the virtual visit: patient, provider  I discussed the limitations of evaluation and management by telemedicine and the availability of in person appointments. The patient expressed understanding and agreed to proceed.   HPI: 78 year old male who is being evaluated today for an acute issue.  He reports that his symptoms have been present for approximately 2 weeks.  Symptoms include rhinorrhea with clear discharge and postnasal drip.  He denies sinus pain and pressure, fevers, chills, loss of taste or smell, sore throat, shortness of breath, or feeling acutely ill.  Has been using Sudafed and NyQuil with minimal relief.   ROS: See pertinent positives and negatives per HPI.  Past Medical History:  Diagnosis Date  . AI (aortic insufficiency) 01/11/2018   Trace, noted on ECHO  . Arthritis   . CAD in native artery    Nuclear stress test 10/19: EF 58, normal perfusion, low risk  . Cellulitis   . Dermatitis   . Diabetes mellitus    type II  . Diastolic dysfunction 123456   Mild, noted on ECHO  . Diverticulosis 01/31/2016   ASCENDING COLON AND CECUM, NOTED ON COLONOSCOPY  . DOE (dyspnea on exertion)   . Edema   . Fatty liver 09/18/2004   Noted on [place] ABD  . History of kidney stones 05/23/2002   Noted on CT Abd  . Hx of colonic polyps 01/31/2016  . Hyperlipidemia   . Hypertension   . Internal hemorrhoids 01/31/2016   Noted on colonoscopy  . Obesity   . OSA (obstructive sleep apnea)    cpap  . Positional vertigo   . Pulmonary hypertension ([place]) 01/11/2018   Mild, noted on ECHO  . Rhinosinusitis   . Skin lesion   . TR (tricuspid regurgitation) 01/11/2018   Trace, noted on ECHO     Past Surgical History:  Procedure Laterality Date  . CARDIAC CATHETERIZATION    . COLONOSCOPY    . CORONARY STENT PLACEMENT     3 stents /1997  . POLYPECTOMY    . TONSILLECTOMY    . TOTAL KNEE ARTHROPLASTY Right 10/04/2018   Procedure: RIGHT TOTAL KNEE ARTHROPLASTY;  Surgeon: Gaynelle Arabian, MD;  Location: [facility];  Service: Orthopedics;  Laterality: Right;  Adductor Block    Family History  Problem Relation Age of Onset  . Cancer Mother        ? lung cancer  . Heart disease Father   . Heart attack Father   . Heart disease Sister   . Diabetes Sister   . Heart attack Sister   . Heart attack Brother      Current Outpatient Medications:  .  albuterol (PROVENTIL HFA;VENTOLIN HFA) 108 (90 Base) MCG/ACT inhaler, Inhale 2 puffs into the lungs every 6 (six) hours as needed for wheezing or shortness of breath., Disp: 1 Inhaler, Rfl: 0 .  amLODipine (NORVASC) 2.5 MG tablet, TAKE 1 TABLET BY MOUTH EVERY DAY, Disp: 90 tablet, Rfl: 0 .  BD PEN NEEDLE NANO U/F 32G X 4 MM MISC, USE AS DIRECTED TWICE A DAY, Disp: 200 each, Rfl: 0 .  doxycycline (VIBRAMYCIN) 100 MG capsule, Take 1 capsule (100 mg total) by mouth 2 (two) times daily., Disp: 14 capsule, Rfl: 0 .  fluticasone (FLONASE) 50 MCG/ACT nasal spray, PLACE 2 SPRAYS INTO BOTH NOSTRILS DAILY AS NEEDED. (Patient taking differently: Place 2 sprays into both nostrils daily as needed for rhinitis. ), Disp: 48 g, Rfl: 2 .  glipiZIDE (GLUCOTROL XL) 10 MG 24 hr tablet, TAKE 1 TABLET (10 MG TOTAL) BY MOUTH DAILY WITH BREAKFAST., Disp: 90 tablet, Rfl: 0 .  Insulin Glargine (BASAGLAR KWIKPEN) 100 UNIT/ML SOPN, INJECT 40 UNITS UNDER THE SKIN EVERY NIGHT AT 10PM. **MUST BE SEEN IN OFFICE FOR FURTHER REFILLS**, Disp: 12 mL, Rfl: 0 .  meclizine (ANTIVERT) 12.5 MG tablet, Take 1 tablet (12.5 mg total) by mouth 3 (three) times daily as needed for dizziness., Disp: 30 tablet, Rfl: 1 .  omeprazole (PRILOSEC) 20 MG capsule, TAKE 1 CAPSULE BY MOUTH EVERY DAY,  Disp: 90 capsule, Rfl: 0 .  simvastatin (ZOCOR) 20 MG tablet, TAKE 1 TABLET BY MOUTH EVERYDAY AT BEDTIME, Disp: 90 tablet, Rfl: 0 .  tamsulosin (FLOMAX) 0.4 MG CAPS capsule, Take 1 capsule by mouth 2 (two) times daily., Disp: , Rfl:  .  TOVIAZ 8 MG TB24 tablet, Take 8 mg by mouth daily. , Disp: , Rfl:  .  VICTOZA 18 MG/3ML SOPN, INJECT 1.2MG  UNDER THE SKIN EVERY DAY AS DIRECTED (Patient taking differently: Inject 1.2 mg into the skin daily. ), Disp: 15 mL, Rfl: 1  EXAM:  VITALS per patient if applicable:  GENERAL: alert, oriented, appears well and in no acute distress  HEENT: atraumatic, conjunttiva clear, no obvious abnormalities on inspection of external nose and ears  NECK: normal movements of the head and neck  LUNGS: on inspection no signs of respiratory distress, breathing rate appears normal, no obvious gross SOB, gasping or wheezing  CV: no obvious cyanosis  MS: moves all visible extremities without noticeable abnormality  PSYCH/NEURO: pleasant and cooperative, no obvious depression or anxiety, speech and thought processing grossly intact  ASSESSMENT AND PLAN:  Discussed the following assessment and plan:  1. Rhinorrhea -Does not appear appear to be bacterial sinusitis.  He was advised that antibiotics are not indicated at this time.  Encouraged him to try Flonase and Claritin.  Discontinue the use of Sudafed and NyQuil.  Follow-up if no improvement in the next week     I discussed the assessment and treatment plan with the patient. The patient was provided an opportunity to ask questions and all were answered. The patient agreed with the plan and demonstrated an understanding of the instructions.   The patient was advised to call back or seek an in-person evaluation if the symptoms worsen or if the condition fails to improve as anticipated.   [name], NP

## 2019-08-28 ENCOUNTER — Other Ambulatory Visit: Payer: Self-pay | Admitting: Adult Health

## 2019-09-14 ENCOUNTER — Other Ambulatory Visit: Payer: Self-pay

## 2019-09-14 ENCOUNTER — Telehealth (INDEPENDENT_AMBULATORY_CARE_PROVIDER_SITE_OTHER): Payer: Medicare Other | Admitting: Adult Health

## 2019-09-14 DIAGNOSIS — B372 Candidiasis of skin and nail: Secondary | ICD-10-CM

## 2019-09-14 MED ORDER — FLUCONAZOLE 150 MG PO TABS: 150.0000 mg | ORAL_TABLET | ORAL | 0 refills | Status: DC

## 2019-09-14 MED ORDER — KETOCONAZOLE 2 % EX CREA: 1.0000 "application " | TOPICAL_CREAM | Freq: Every day | CUTANEOUS | 0 refills | Status: DC

## 2019-09-14 NOTE — Progress Notes (Signed)
Virtual Visit via Video Note  I connected with Jacob Bishop on 09/14/19 at  9:00 AM EST by a video enabled telemedicine application and verified that I am speaking with the correct person using two identifiers.  Location patient: home Location provider:work or home office Persons participating in the virtual visit: patient, provider  I discussed the limitations of evaluation and management by telemedicine and the availability of in person appointments. The patient expressed understanding and agreed to proceed.   HPI: 79 year old male who is being evaluated today for an acute issue of "rash under my breast".  First noticed this rash approximately 4 days ago.  Rash is itchy and irritating.  Denies drainage or skin breakdown.  He reports that his blood sugars have been within normal range.     ROS: See pertinent positives and negatives per HPI.  Past Medical History:  Diagnosis Date  . AI (aortic insufficiency) 01/11/2018   Trace, noted on ECHO  . Arthritis   . CAD in native artery    Nuclear stress test 10/19: EF 58, normal perfusion, low risk  . Cellulitis   . Dermatitis   . Diabetes mellitus    type II  . Diastolic dysfunction 123456   Mild, noted on ECHO  . Diverticulosis 01/31/2016   ASCENDING COLON AND CECUM, NOTED ON COLONOSCOPY  . DOE (dyspnea on exertion)   . Edema   . Fatty liver 09/18/2004   Noted on [place] ABD  . History of kidney stones 05/23/2002   Noted on CT Abd  . Hx of colonic polyps 01/31/2016  . Hyperlipidemia   . Hypertension   . Internal hemorrhoids 01/31/2016   Noted on colonoscopy  . Obesity   . OSA (obstructive sleep apnea)    cpap  . Positional vertigo   . Pulmonary hypertension ([place]) 01/11/2018   Mild, noted on ECHO  . Rhinosinusitis   . Skin lesion   . TR (tricuspid regurgitation) 01/11/2018   Trace, noted on ECHO    Past Surgical History:  Procedure Laterality Date  . CARDIAC CATHETERIZATION    . COLONOSCOPY    . CORONARY STENT  PLACEMENT     3 stents /1997  . POLYPECTOMY    . TONSILLECTOMY    . TOTAL KNEE ARTHROPLASTY Right 10/04/2018   Procedure: RIGHT TOTAL KNEE ARTHROPLASTY;  Surgeon: Gaynelle Arabian, MD;  Location: [facility];  Service: Orthopedics;  Laterality: Right;  Adductor Block    Family History  Problem Relation Age of Onset  . Cancer Mother        ? lung cancer  . Heart disease Father   . Heart attack Father   . Heart disease Sister   . Diabetes Sister   . Heart attack Sister   . Heart attack Brother        Current Outpatient Medications:  .  albuterol (PROVENTIL HFA;VENTOLIN HFA) 108 (90 Base) MCG/ACT inhaler, Inhale 2 puffs into the lungs every 6 (six) hours as needed for wheezing or shortness of breath., Disp: 1 Inhaler, Rfl: 0 .  amLODipine (NORVASC) 2.5 MG tablet, TAKE 1 TABLET BY MOUTH EVERY DAY, Disp: 90 tablet, Rfl: 0 .  BD PEN NEEDLE NANO U/F 32G X 4 MM MISC, USE AS DIRECTED TWICE A DAY, Disp: 200 each, Rfl: 0 .  doxycycline (VIBRAMYCIN) 100 MG capsule, Take 1 capsule (100 mg total) by mouth 2 (two) times daily., Disp: 14 capsule, Rfl: 0 .  fluticasone (FLONASE) 50 MCG/ACT nasal spray, PLACE 2 SPRAYS INTO BOTH NOSTRILS DAILY  AS NEEDED., Disp: 48 mL, Rfl: 0 .  glipiZIDE (GLUCOTROL XL) 10 MG 24 hr tablet, TAKE 1 TABLET (10 MG TOTAL) BY MOUTH DAILY WITH BREAKFAST., Disp: 90 tablet, Rfl: 0 .  Insulin Glargine (BASAGLAR KWIKPEN) 100 UNIT/ML SOPN, INJECT 40 UNITS UNDER THE SKIN EVERY NIGHT AT 10PM. **MUST BE SEEN IN OFFICE FOR FURTHER REFILLS**, Disp: 12 mL, Rfl: 0 .  meclizine (ANTIVERT) 12.5 MG tablet, Take 1 tablet (12.5 mg total) by mouth 3 (three) times daily as needed for dizziness., Disp: 30 tablet, Rfl: 1 .  omeprazole (PRILOSEC) 20 MG capsule, TAKE 1 CAPSULE BY MOUTH EVERY DAY, Disp: 90 capsule, Rfl: 0 .  simvastatin (ZOCOR) 20 MG tablet, TAKE 1 TABLET BY MOUTH EVERYDAY AT BEDTIME, Disp: 90 tablet, Rfl: 1 .  tamsulosin (FLOMAX) 0.4 MG CAPS capsule, Take 1 capsule by mouth 2 (two) times  daily., Disp: , Rfl:  .  TOVIAZ 8 MG TB24 tablet, Take 8 mg by mouth daily. , Disp: , Rfl:  .  VICTOZA 18 MG/3ML SOPN, INJECT 1.2MG  UNDER THE SKIN EVERY DAY AS DIRECTED (Patient taking differently: Inject 1.2 mg into the skin daily. ), Disp: 15 mL, Rfl: 1  EXAM:  VITALS per patient if applicable:  GENERAL: alert, oriented, appears well and in no acute distress  HEENT: atraumatic, conjunttiva clear, no obvious abnormalities on inspection of external nose and ears  NECK: normal movements of the head and neck  LUNGS: on inspection no signs of respiratory distress, breathing rate appears normal, no obvious gross SOB, gasping or wheezing  CV: no obvious cyanosis  MS: moves all visible extremities without noticeable abnormality  PSYCH/NEURO: pleasant and cooperative, no obvious depression or anxiety, speech and thought processing grossly intact  SKIN: Red flat rash with well approximated border noted under bilateral breasts.  ASSESSMENT AND PLAN:  Discussed the following assessment and plan:  1. Candidal intertrigo - Appears as fungal rash. Will scribed Diflucan and ketoconazole cream.  Was advised to keep the area clean and dry.  Follow-up in office if no improvement over the next 7 to 10 days - fluconazole (DIFLUCAN) 150 MG tablet; Take 1 tablet (150 mg total) by mouth once a week.  Dispense: 2 tablet; Refill: 0 - ketoconazole (NIZORAL) 2 % cream; Apply 1 application topically daily. Apply twice daily until rash has resolved  Dispense: 30 g; Refill: 0      I discussed the assessment and treatment plan with the patient. The patient was provided an opportunity to ask questions and all were answered. The patient agreed with the plan and demonstrated an understanding of the instructions.   The patient was advised to call back or seek an in-person evaluation if the symptoms worsen or if the condition fails to improve as anticipated.   [name], NP

## 2019-09-22 ENCOUNTER — Ambulatory Visit: Payer: Medicare Other

## 2019-10-01 ENCOUNTER — Ambulatory Visit: Payer: Medicare Other

## 2019-10-03 ENCOUNTER — Ambulatory Visit: Payer: Medicare Other

## 2019-10-03 ENCOUNTER — Other Ambulatory Visit: Payer: Self-pay

## 2019-10-04 ENCOUNTER — Encounter: Payer: Self-pay | Admitting: Adult Health

## 2019-10-04 ENCOUNTER — Ambulatory Visit (INDEPENDENT_AMBULATORY_CARE_PROVIDER_SITE_OTHER): Payer: Medicare Other | Admitting: Adult Health

## 2019-10-04 VITALS — BP 108/60 | HR 57 | Temp 97.2°F | Ht 69.5 in | Wt 260.0 lb

## 2019-10-04 DIAGNOSIS — E785 Hyperlipidemia, unspecified: Secondary | ICD-10-CM

## 2019-10-04 DIAGNOSIS — E1165 Type 2 diabetes mellitus with hyperglycemia: Secondary | ICD-10-CM | POA: Diagnosis not present

## 2019-10-04 DIAGNOSIS — N3281 Overactive bladder: Secondary | ICD-10-CM | POA: Diagnosis not present

## 2019-10-04 DIAGNOSIS — Z23 Encounter for immunization: Secondary | ICD-10-CM

## 2019-10-04 DIAGNOSIS — R21 Rash and other nonspecific skin eruption: Secondary | ICD-10-CM

## 2019-10-04 DIAGNOSIS — N400 Enlarged prostate without lower urinary tract symptoms: Secondary | ICD-10-CM | POA: Diagnosis not present

## 2019-10-04 DIAGNOSIS — G4733 Obstructive sleep apnea (adult) (pediatric): Secondary | ICD-10-CM | POA: Diagnosis not present

## 2019-10-04 DIAGNOSIS — I10 Essential (primary) hypertension: Secondary | ICD-10-CM | POA: Diagnosis not present

## 2019-10-04 LAB — CBC WITH DIFFERENTIAL/PLATELET
Basophils Absolute: 0 10*3/uL (ref 0.0–0.1)
Basophils Relative: 0.5 % (ref 0.0–3.0)
Eosinophils Absolute: 0.3 10*3/uL (ref 0.0–0.7)
Eosinophils Relative: 6 % — ABNORMAL HIGH (ref 0.0–5.0)
HCT: 43.1 % (ref 39.0–52.0)
Hemoglobin: 14.5 g/dL (ref 13.0–17.0)
Lymphocytes Relative: 33 % (ref 12.0–46.0)
Lymphs Abs: 1.9 10*3/uL (ref 0.7–4.0)
MCHC: 33.7 g/dL (ref 30.0–36.0)
MCV: 94.9 fl (ref 78.0–100.0)
Monocytes Absolute: 0.5 10*3/uL (ref 0.1–1.0)
Monocytes Relative: 9.3 % (ref 3.0–12.0)
Neutro Abs: 3 10*3/uL (ref 1.4–7.7)
Neutrophils Relative %: 51.2 % (ref 43.0–77.0)
Platelets: 95 10*3/uL — ABNORMAL LOW (ref 150.0–400.0)
RBC: 4.54 Mil/uL (ref 4.22–5.81)
RDW: 13.6 % (ref 11.5–15.5)
WBC: 5.8 10*3/uL (ref 4.0–10.5)

## 2019-10-04 LAB — COMPREHENSIVE METABOLIC PANEL
ALT: 41 U/L (ref 0–53)
AST: 35 U/L (ref 0–37)
Albumin: 4.1 g/dL (ref 3.5–5.2)
Alkaline Phosphatase: 98 U/L (ref 39–117)
BUN: 21 mg/dL (ref 6–23)
CO2: 29 mEq/L (ref 19–32)
Calcium: 9.2 mg/dL (ref 8.4–10.5)
Chloride: 109 mEq/L (ref 96–112)
Creatinine, Ser: 0.94 mg/dL (ref 0.40–1.50)
GFR: 77.99 mL/min (ref >60.00)
Glucose, Bld: 121 mg/dL — ABNORMAL HIGH (ref 70–99)
Potassium: 4.6 mEq/L (ref 3.5–5.1)
Sodium: 144 mEq/L (ref 135–145)
Total Bilirubin: 1 mg/dL (ref 0.2–1.2)
Total Protein: 6.9 g/dL (ref 6.0–8.3)

## 2019-10-04 LAB — LIPID PANEL
Cholesterol: 137 mg/dL (ref 0–200)
HDL: 38.5 mg/dL — ABNORMAL LOW (ref >39.00)
LDL Cholesterol: 67 mg/dL (ref 0–99)
NonHDL: 98.25
Total CHOL/HDL Ratio: 4
Triglycerides: 155 mg/dL — ABNORMAL HIGH (ref 0.0–149.0)
VLDL: 31 mg/dL (ref 0.0–40.0)

## 2019-10-04 LAB — HEMOGLOBIN A1C: Hgb A1c MFr Bld: 6 % (ref 4.6–6.5)

## 2019-10-04 LAB — TSH: TSH: 2.18 u[IU]/mL (ref 0.35–4.50)

## 2019-10-04 LAB — PSA: PSA: 2.69 ng/mL (ref 0.10–4.00)

## 2019-10-04 NOTE — Progress Notes (Signed)
Patient presents for yearly preventative medicine examination. He is a pleasant 79 year old male who  has a past medical history of AI (aortic insufficiency) (01/11/2018), Arthritis, CAD in native artery, Cellulitis, Dermatitis, Diabetes mellitus, Diastolic dysfunction (123456), Diverticulosis (01/31/2016), DOE (dyspnea on exertion), Edema, Fatty liver (09/18/2004), History of kidney stones (05/23/2002), colonic polyps (01/31/2016), Hyperlipidemia, Hypertension, Internal hemorrhoids (01/31/2016), Obesity, OSA (obstructive sleep apnea), Positional vertigo, Pulmonary hypertension ([place]) (01/11/2018), Rhinosinusitis, Skin lesion, and TR (tricuspid regurgitation) (01/11/2018).  DM -he is currently prescribed Victoza 1.2 mg daily, glipizide 10 mg extended release daily, and Basaglar 40 units nightly.  He does not monitor his blood sugars at home on a routine basis. He does not feel as though he has been having episodes of hypoglycemia. Lab Results  Component Value Date   HGBA1C 6.5 07/14/2019   Hypertension -prescribed Norvasc 2.5 mg. Currently denies chest pain or shortness of breath. He is experiencing intermittent episodes of dizziness when he changes positions. He is staying well hydrated  BP Readings from Last 3 Encounters:  10/04/19 108/60  07/20/19 126/60  07/14/19 120/70   Hyperlipidemia-prescribed Zocor 20 mg daily  BPH-is managed by urology, Dr. Lovena Neighbours.  Currently prescribed Flomax 0.4 mg daily  OSA -is followed by pulmonary.  He is compliant on CPAP.  He feels rested without any significant daytime sleepiness.  Rash -was diagnosed and treated for shingles in November 2020.  Shingles appeared to heal well but he continues to have a large red rash on his left lower back and  left flank that has now spread across midline onto his right lower back.  He also has this rash on his right shoulder.  Does endorse itching.  He has tried various cortisone creams and lotions without improvement.   He denies drainage from the rash.  All immunizations and health maintenance protocols were reviewed with the patient and needed orders were placed. He refuses his pne  Appropriate screening laboratory values were ordered for the patient including screening of hyperlipidemia, renal function and hepatic function. If indicated by BPH, a PSA was ordered.  Medication reconciliation,  past medical history, social history, problem list and allergies were reviewed in detail with the patient  Goals were established with regard to weight loss, exercise, and  diet in compliance with medications. He has been watching his diet but does not exercise much do to osteoarthritis in the left knee. He did have his right knee replaced earlier this year and reports that he has no pain in his right knee.   Wt Readings from Last 5 Encounters:  10/04/19 260 lb (117.9 kg)  07/20/19 268 lb (121.6 kg)  07/14/19 267 lb (121.1 kg)  11/04/18 265 lb (120.2 kg)  10/04/18 285 lb 0.9 oz (129.3 kg)     Past Medical History:  Diagnosis Date  . AI (aortic insufficiency) 01/11/2018   Trace, noted on ECHO  . Arthritis   . CAD in native artery    Nuclear stress test 10/19: EF 58, normal perfusion, low risk  . Cellulitis   . Dermatitis   . Diabetes mellitus    type II  . Diastolic dysfunction 123456   Mild, noted on ECHO  . Diverticulosis 01/31/2016   ASCENDING COLON AND CECUM, NOTED ON COLONOSCOPY  . DOE (dyspnea on exertion)   . Edema   . Fatty liver 09/18/2004   Noted on [place] ABD  . History of kidney stones 05/23/2002   Noted on CT Abd  . Hx of colonic polyps  01/31/2016  . Hyperlipidemia   . Hypertension   . Internal hemorrhoids 01/31/2016   Noted on colonoscopy  . Obesity   . OSA (obstructive sleep apnea)    cpap  . Positional vertigo   . Pulmonary hypertension ([place]) 01/11/2018   Mild, noted on ECHO  . Rhinosinusitis   . Skin lesion   . TR (tricuspid regurgitation) 01/11/2018   Trace, noted on  ECHO    Past Surgical History:  Procedure Laterality Date  . CARDIAC CATHETERIZATION    . COLONOSCOPY    . CORONARY STENT PLACEMENT     3 stents /1997  . POLYPECTOMY    . TONSILLECTOMY    . TOTAL KNEE ARTHROPLASTY Right 10/04/2018   Procedure: RIGHT TOTAL KNEE ARTHROPLASTY;  Surgeon: Gaynelle Arabian, MD;  Location: [facility];  Service: Orthopedics;  Laterality: Right;  Adductor Block    Current Outpatient Medications on File Prior to Visit  Medication Sig Dispense Refill  . albuterol (PROVENTIL HFA;VENTOLIN HFA) 108 (90 Base) MCG/ACT inhaler Inhale 2 puffs into the lungs every 6 (six) hours as needed for wheezing or shortness of breath. 1 Inhaler 0  . amLODipine (NORVASC) 2.5 MG tablet TAKE 1 TABLET BY MOUTH EVERY DAY 90 tablet 0  . BD PEN NEEDLE NANO U/F 32G X 4 MM MISC USE AS DIRECTED TWICE A DAY 200 each 0  . fluconazole (DIFLUCAN) 150 MG tablet Take 1 tablet (150 mg total) by mouth once a week. 2 tablet 0  . fluticasone (FLONASE) 50 MCG/ACT nasal spray PLACE 2 SPRAYS INTO BOTH NOSTRILS DAILY AS NEEDED. 48 mL 0  . glipiZIDE (GLUCOTROL XL) 10 MG 24 hr tablet TAKE 1 TABLET (10 MG TOTAL) BY MOUTH DAILY WITH BREAKFAST. 90 tablet 0  . Insulin Glargine (BASAGLAR KWIKPEN) 100 UNIT/ML SOPN INJECT 40 UNITS UNDER THE SKIN EVERY NIGHT AT 10PM. **MUST BE SEEN IN OFFICE FOR FURTHER REFILLS** 12 mL 0  . ketoconazole (NIZORAL) 2 % cream Apply 1 application topically daily. Apply twice daily until rash has resolved 30 g 0  . meclizine (ANTIVERT) 12.5 MG tablet Take 1 tablet (12.5 mg total) by mouth 3 (three) times daily as needed for dizziness. 30 tablet 1  . omeprazole (PRILOSEC) 20 MG capsule TAKE 1 CAPSULE BY MOUTH EVERY DAY 90 capsule 0  . simvastatin (ZOCOR) 20 MG tablet TAKE 1 TABLET BY MOUTH EVERYDAY AT BEDTIME 90 tablet 1  . tamsulosin (FLOMAX) 0.4 MG CAPS capsule Take 1 capsule by mouth 2 (two) times daily.    . TOVIAZ 8 MG TB24 tablet Take 8 mg by mouth daily.     Marland Kitchen VICTOZA 18 MG/3ML SOPN  INJECT 1.2MG  UNDER THE SKIN EVERY DAY AS DIRECTED (Patient taking differently: Inject 1.2 mg into the skin daily. ) 15 mL 1   No current facility-administered medications on file prior to visit.    Allergies  Allergen Reactions  . Lipitor [Atorvastatin Calcium] Other (See Comments)    Muscle soreness  . Oxycodone Anxiety    Causes patient to become shaky and dizzy. Unable to tolerate.    Family History  Problem Relation Age of Onset  . Cancer Mother        ? lung cancer  . Heart disease Father   . Heart attack Father   . Heart disease Sister   . Diabetes Sister   . Heart attack Sister   . Heart attack Brother     Social History   Socioeconomic History  . Marital status: Married  Spouse name: Not on file  . Number of children: Not on file  . Years of education: Not on file  . Highest education level: Not on file  Occupational History  . Occupation: Financial controller, Health and safety inspector: [employer]  Tobacco Use  . Smoking status: Former Smoker    Packs/day: 1.00    Years: 20.00    Pack years: 20.00    Types: Cigarettes    Quit date: 01/10/1996    Years since quitting: 23.7  . Smokeless tobacco: Never Used  Substance and Sexual Activity  . Alcohol use: No  . Drug use: No  . Sexual activity: Not on file  Other Topics Concern  . Not on file  Social History Narrative   Grew up in [place]. Mother was [place], Father [place].   He works daily - owns a [employer]    Married for 30+ years   Has a daughter who lives in [place] Strain:   . Difficulty of Paying Living Expenses: Not on file  Food Insecurity:   . Worried About Charity fundraiser in the Last Year: Not on file  . Ran Out of Food in the Last Year: Not on file  Transportation Needs:   . Lack of Transportation (Medical): Not on file  . Lack of Transportation (Non-Medical): Not on file  Physical Activity:   .  Days of Exercise per Week: Not on file  . Minutes of Exercise per Session: Not on file  Stress:   . Feeling of Stress : Not on file  Social Connections:   . Frequency of Communication with Friends and Family: Not on file  . Frequency of Social Gatherings with Friends and Family: Not on file  . Attends Religious Services: Not on file  . Active Member of Clubs or Organizations: Not on file  . Attends Archivist Meetings: Not on file  . Marital Status: Not on file  Intimate Partner Violence:   . Fear of Current or Ex-Partner: Not on file  . Emotionally Abused: Not on file  . Physically Abused: Not on file  . Sexually Abused: Not on file    Review of Systems  Constitutional: Negative.   HENT: Negative.   Eyes: Negative.   Respiratory: Negative.   Cardiovascular: Negative.   Gastrointestinal: Negative.   Genitourinary: Negative.   Musculoskeletal: Negative.   Skin: Negative.   Neurological: Negative.   Endo/Heme/Allergies: Negative.   Psychiatric/Behavioral: Negative.   All other systems reviewed and are negative.   BP 108/60   Pulse (!) 57   Temp (!) 97.2 F (36.2 C) (Other (Comment))   Ht 5' 9.5" (1.765 m)   Wt 260 lb (117.9 kg)   SpO2 91%   BMI 37.84 kg/m   Physical Exam Vitals and nursing note reviewed.  Constitutional:      General: He is not in acute distress.    Appearance: Normal appearance. He is well-developed. He is not diaphoretic.  HENT:     Head: Normocephalic and atraumatic.     Right Ear: Tympanic membrane, ear canal and external ear normal. There is no impacted cerumen.     Left Ear: Tympanic membrane, ear canal and external ear normal. There is no impacted cerumen.     Nose: Nose normal. No congestion or rhinorrhea.     Mouth/Throat:     Mouth: Mucous membranes are moist.  Pharynx: Oropharynx is clear. No oropharyngeal exudate or posterior oropharyngeal erythema.  Eyes:     General:        Right eye: No discharge.        Left eye:  No discharge.     Conjunctiva/sclera: Conjunctivae normal.     Pupils: Pupils are equal, round, and reactive to light.  Neck:     Thyroid: No thyromegaly.     Trachea: No tracheal deviation.  Cardiovascular:     Rate and Rhythm: Normal rate and regular rhythm.     Pulses: Normal pulses.     Heart sounds: Normal heart sounds. No murmur. No friction rub. No gallop.   Pulmonary:     Effort: Pulmonary effort is normal. No respiratory distress.     Breath sounds: Normal breath sounds. No stridor. No wheezing or rales.  Chest:     Chest wall: No tenderness.  Abdominal:     General: Bowel sounds are normal. There is no distension.     Palpations: Abdomen is soft. There is no mass.     Tenderness: There is no abdominal tenderness. There is no right CVA tenderness, left CVA tenderness, guarding or rebound.     Hernia: No hernia is present.  Musculoskeletal:        General: No swelling, tenderness, deformity or signs of injury. Normal range of motion.     Cervical back: Normal range of motion and neck supple.     Right lower leg: No edema.     Left lower leg: No edema.  Lymphadenopathy:     Cervical: No cervical adenopathy.  Skin:    General: Skin is warm and dry.     Capillary Refill: Capillary refill takes less than 2 seconds.     Coloration: Skin is not jaundiced or pale.     Findings: Rash present. No bruising or erythema.     Comments: Large flat erythemic rash noted dominantly on left lower back but does cross midline onto her right lower back and spreads to the left flank.  Also has a red vesicular appearing rash on left shoulder  Neurological:     General: No focal deficit present.     Mental Status: He is alert and oriented to person, place, and time.     Cranial Nerves: No cranial nerve deficit.     Sensory: No sensory deficit.     Motor: No weakness.     Coordination: Coordination normal.     Gait: Gait normal.     Deep Tendon Reflexes: Reflexes normal.  Psychiatric:         Mood and Affect: Mood normal.        Behavior: Behavior normal.        Thought Content: Thought content normal.        Judgment: Judgment normal.    Assessment/Plan: 1. Uncontrolled type 2 diabetes mellitus with hyperglycemia (HCC) - consider dose change of insulin  - CBC with Differential/Platelet - Comprehensive metabolic panel - Lipid panel - TSH - Hemoglobin A1c  2. Essential hypertension -Going to have him DC Norvasc as this might be causing dizziness.  We will have him follow-up in 2 weeks to recheck blood pressure - CBC with Differential/Platelet - Comprehensive metabolic panel - Lipid panel - TSH - Hemoglobin A1c  3. Obstructive sleep apnea - Continue with CPAP - CBC with Differential/Platelet - Comprehensive metabolic panel - Lipid panel - TSH - Hemoglobin A1c  4. Hyperlipidemia, unspecified hyperlipidemia type -No change in  therapy  - CBC with Differential/Platelet - Comprehensive metabolic panel - Lipid panel - TSH - Hemoglobin A1c  5. Overactive bladder - No change in therapy - CBC with Differential/Platelet - Comprehensive metabolic panel - Lipid panel - TSH - Hemoglobin A1c  6. Morbid obesity ([place]) - Encouraged to continue to work on weight loss through diet and exercise. He is doing well with weight loss - CBC with Differential/Platelet - Comprehensive metabolic panel - Lipid panel - TSH - Hemoglobin A1c  7. Benign prostatic hyperplasia without lower urinary tract symptoms  - PSA  8. Need for pneumococcal vaccination  - Pneumococcal polysaccharide vaccine 23-valent greater than or equal to 2yo subcutaneous/IM  9. Rash and nonspecific skin eruption  - Ambulatory referral to Dermatology   [name], NP

## 2019-10-04 NOTE — Patient Instructions (Addendum)
It was great seeing you today   I am going to have you stop Norvasc ( Amlodipine) to see if this helps with the dizziness.   Please follow up with me in two weeks   We will follow up with you regarding your blood work   Someone will call you to schedule your appointment with Dermatology

## 2019-10-05 ENCOUNTER — Telehealth: Payer: Self-pay | Admitting: Adult Health

## 2019-10-05 ENCOUNTER — Other Ambulatory Visit: Payer: Self-pay | Admitting: Adult Health

## 2019-10-05 NOTE — Telephone Encounter (Signed)
Updated patient on his labs.  A1c has improved from 6.5-6.  We will keep him on his current medication regimen and have him follow-up in 3 months, if A1c continues to be low we will start backing off on his insulin dose

## 2019-10-11 DIAGNOSIS — L218 Other seborrheic dermatitis: Secondary | ICD-10-CM | POA: Diagnosis not present

## 2019-10-11 DIAGNOSIS — L309 Dermatitis, unspecified: Secondary | ICD-10-CM | POA: Diagnosis not present

## 2019-10-11 DIAGNOSIS — L570 Actinic keratosis: Secondary | ICD-10-CM | POA: Diagnosis not present

## 2019-10-11 DIAGNOSIS — L819 Disorder of pigmentation, unspecified: Secondary | ICD-10-CM | POA: Diagnosis not present

## 2019-10-18 ENCOUNTER — Telehealth: Payer: Self-pay | Admitting: Adult Health

## 2019-10-18 NOTE — Telephone Encounter (Signed)
Called pt cell no answer. Called home number pt spouse answered and stated she will have pt return call. Pt needs to keep appointment due to medication change to help with blood pressure. Pt need to come in so that we can check blood pressure.

## 2019-10-18 NOTE — Telephone Encounter (Signed)
Pt is wondering if Jacob Bishop wants him to set up a follow up appointment. He said that he does not have anything going on other than the skin situation which he is going to go to a dermatologist for. I informed pt that on his last visit his AFV said to follow up with [facility] in 2 weeks but pt still wanted to cancel his appt on 2/26 and wanted me to ask his CMA what he wants to do?  Pt can be reached at [phone]

## 2019-10-19 NOTE — Telephone Encounter (Signed)
Left a message for a return call.  Pt needs to be told to keep upcoming appointment due to bp meds changed at last ov.

## 2019-10-20 ENCOUNTER — Other Ambulatory Visit: Payer: Self-pay

## 2019-10-20 NOTE — Telephone Encounter (Signed)
Left a message for a return call.  Pt needs to be told to keep upcoming appointment due to bp meds changed at last ov.

## 2019-10-21 ENCOUNTER — Encounter: Payer: Self-pay | Admitting: Adult Health

## 2019-10-21 ENCOUNTER — Ambulatory Visit (INDEPENDENT_AMBULATORY_CARE_PROVIDER_SITE_OTHER): Payer: Medicare Other | Admitting: Adult Health

## 2019-10-21 ENCOUNTER — Ambulatory Visit: Payer: Medicare Other | Admitting: Adult Health

## 2019-10-21 VITALS — BP 126/70 | Temp 97.3°F | Wt 260.0 lb

## 2019-10-21 DIAGNOSIS — I10 Essential (primary) hypertension: Secondary | ICD-10-CM | POA: Diagnosis not present

## 2019-10-21 NOTE — Progress Notes (Signed)
Subjective:    Patient ID: Jacob Bishop, male    DOB: Dec 14, 1940, 79 y.o.   MRN: [id number]  HPI  79 year old male who is being evaluated today for 2-week follow-up regarding dizziness.  At his CPE on 10/04/2019 he reported experiencing intermittent episodes of dizziness when he changes positions.  He was staying well-hydrated at this time.  In the office his blood pressure was 108/60 and it was thought that he may be having some orthostatic hypotension so his 2.5 mg dose of Norvasc was held  Today he reports that he is feeling "a lot better".  He is no longer having dizziness when he changes positions.  He denies headaches, blurred vision, chest pain, or shortness of breath.  BP Readings from Last 3 Encounters:  10/21/19 126/70  10/04/19 108/60  07/20/19 126/60    Review of Systems See HPI   Past Medical History:  Diagnosis Date  . AI (aortic insufficiency) 01/11/2018   Trace, noted on ECHO  . Arthritis   . CAD in native artery    Nuclear stress test 10/19: EF 58, normal perfusion, low risk  . Cellulitis   . Dermatitis   . Diabetes mellitus    type II  . Diastolic dysfunction 123456   Mild, noted on ECHO  . Diverticulosis 01/31/2016   ASCENDING COLON AND CECUM, NOTED ON COLONOSCOPY  . DOE (dyspnea on exertion)   . Edema   . Fatty liver 09/18/2004   Noted on [place] ABD  . History of kidney stones 05/23/2002   Noted on CT Abd  . Hx of colonic polyps 01/31/2016  . Hyperlipidemia   . Hypertension   . Internal hemorrhoids 01/31/2016   Noted on colonoscopy  . Obesity   . OSA (obstructive sleep apnea)    cpap  . Positional vertigo   . Pulmonary hypertension ([place]) 01/11/2018   Mild, noted on ECHO  . Rhinosinusitis   . Skin lesion   . TR (tricuspid regurgitation) 01/11/2018   Trace, noted on ECHO    Social History   Socioeconomic History  . Marital status: Married    Spouse name: Not on file  . Number of children: Not on file  . Years of education: Not on file    . Highest education level: Not on file  Occupational History  . Occupation: Financial controller, Health and safety inspector: [employer]  Tobacco Use  . Smoking status: Former Smoker    Packs/day: 1.00    Years: 20.00    Pack years: 20.00    Types: Cigarettes    Quit date: 01/10/1996    Years since quitting: 23.7  . Smokeless tobacco: Never Used  Substance and Sexual Activity  . Alcohol use: No  . Drug use: No  . Sexual activity: Not on file  Other Topics Concern  . Not on file  Social History Narrative   Grew up in [place]. Mother was [place], Father [place].   He works daily - owns a [place]    Married for 30+ years   Has a daughter who lives in [place] Strain:   . Difficulty of Paying Living Expenses: Not on file  Food Insecurity:   . Worried About Charity fundraiser in the Last Year: Not on file  . Ran Out of Food in the Last Year: Not on file  Transportation Needs:   . Lack of Transportation (Medical): Not on  file  . Lack of Transportation (Non-Medical): Not on file  Physical Activity:   . Days of Exercise per Week: Not on file  . Minutes of Exercise per Session: Not on file  Stress:   . Feeling of Stress : Not on file  Social Connections:   . Frequency of Communication with Friends and Family: Not on file  . Frequency of Social Gatherings with Friends and Family: Not on file  . Attends Religious Services: Not on file  . Active Member of Clubs or Organizations: Not on file  . Attends Archivist Meetings: Not on file  . Marital Status: Not on file  Intimate Partner Violence:   . Fear of Current or Ex-Partner: Not on file  . Emotionally Abused: Not on file  . Physically Abused: Not on file  . Sexually Abused: Not on file    Past Surgical History:  Procedure Laterality Date  . CARDIAC CATHETERIZATION    . COLONOSCOPY    . CORONARY STENT PLACEMENT     3 stents  /1997  . POLYPECTOMY    . TONSILLECTOMY    . TOTAL KNEE ARTHROPLASTY Right 10/04/2018   Procedure: RIGHT TOTAL KNEE ARTHROPLASTY;  Surgeon: Gaynelle Arabian, MD;  Location: [facility];  Service: Orthopedics;  Laterality: Right;  Adductor Block    Family History  Problem Relation Age of Onset  . Cancer Mother        ? lung cancer  . Heart disease Father   . Heart attack Father   . Heart disease Sister   . Diabetes Sister   . Heart attack Sister   . Heart attack Brother     Allergies  Allergen Reactions  . Lipitor [Atorvastatin Calcium] Other (See Comments)    Muscle soreness  . Oxycodone Anxiety    Causes patient to become shaky and dizzy. Unable to tolerate.    Current Outpatient Medications on File Prior to Visit  Medication Sig Dispense Refill  . albuterol (PROVENTIL HFA;VENTOLIN HFA) 108 (90 Base) MCG/ACT inhaler Inhale 2 puffs into the lungs every 6 (six) hours as needed for wheezing or shortness of breath. 1 Inhaler 0  . amLODipine (NORVASC) 2.5 MG tablet TAKE 1 TABLET BY MOUTH EVERY DAY 90 tablet 0  . BD PEN NEEDLE NANO U/F 32G X 4 MM MISC USE AS DIRECTED TWICE A DAY 200 each 0  . fluticasone (FLONASE) 50 MCG/ACT nasal spray PLACE 2 SPRAYS INTO BOTH NOSTRILS DAILY AS NEEDED. 48 mL 0  . glipiZIDE (GLUCOTROL XL) 10 MG 24 hr tablet TAKE 1 TABLET (10 MG TOTAL) BY MOUTH DAILY WITH BREAKFAST. 90 tablet 0  . Insulin Glargine (BASAGLAR KWIKPEN) 100 UNIT/ML SOPN INJECT 40 UNITS UNDER THE SKIN EVERY NIGHT AT 10PM. **MUST BE SEEN IN OFFICE FOR FURTHER REFILLS** 12 mL 0  . ketoconazole (NIZORAL) 2 % cream Apply 1 application topically daily. Apply twice daily until rash has resolved 30 g 0  . meclizine (ANTIVERT) 12.5 MG tablet Take 1 tablet (12.5 mg total) by mouth 3 (three) times daily as needed for dizziness. 30 tablet 1  . omeprazole (PRILOSEC) 20 MG capsule TAKE 1 CAPSULE BY MOUTH EVERY DAY 90 capsule 0  . simvastatin (ZOCOR) 20 MG tablet TAKE 1 TABLET BY MOUTH EVERYDAY AT BEDTIME 90  tablet 1  . tamsulosin (FLOMAX) 0.4 MG CAPS capsule Take 1 capsule by mouth 2 (two) times daily.    . TOVIAZ 8 MG TB24 tablet Take 8 mg by mouth daily.     Marland Kitchen  VICTOZA 18 MG/3ML SOPN INJECT 1.2MG  UNDER THE SKIN EVERY DAY AS DIRECTED (Patient taking differently: Inject 1.2 mg into the skin daily. ) 15 mL 1   No current facility-administered medications on file prior to visit.    BP 126/70 (Cuff Size: Normal)   Temp (!) 97.3 F (36.3 C)   Wt 260 lb (117.9 kg)   BMI 37.84 kg/m       Objective:   Physical Exam Vitals and nursing note reviewed.  Constitutional:      Appearance: Normal appearance.  Cardiovascular:     Rate and Rhythm: Normal rate and regular rhythm.     Pulses: Normal pulses.     Heart sounds: Normal heart sounds.  Pulmonary:     Effort: Pulmonary effort is normal.     Breath sounds: Normal breath sounds.  Neurological:     Mental Status: He is alert.  Psychiatric:        Mood and Affect: Mood normal.        Behavior: Behavior normal.        Thought Content: Thought content normal.        Judgment: Judgment normal.       Assessment & Plan:  1. Essential hypertension -Blood pressure at goal today.  Will DC Norvasc.  He has been able to lose significant weight, upwards of 30 pounds over the last 2 years due to lifestyle modifications.  Encouraged to monitor blood pressure at home periodically and return precautions given  [name], NP

## 2019-10-21 NOTE — Progress Notes (Deleted)
   Subjective:    Patient ID: Jacob Bishop, male    DOB: [birth year], 79 y.o.   MRN: [id number]  HPI    Review of Systems     Objective:   Physical Exam        Assessment & Plan:

## 2019-10-21 NOTE — Telephone Encounter (Signed)
Pt seen in the office 10/21/2019.

## 2019-10-30 DIAGNOSIS — Z23 Encounter for immunization: Secondary | ICD-10-CM | POA: Diagnosis not present

## 2019-11-07 ENCOUNTER — Telehealth: Payer: Self-pay | Admitting: Adult Health

## 2019-11-07 ENCOUNTER — Other Ambulatory Visit: Payer: Self-pay | Admitting: Endocrinology

## 2019-11-07 NOTE — Telephone Encounter (Signed)
Medication Refill: Insulin Glargine Pharmacy: [facility] FAX: [phone]   Pt states he is out of this medication and he does not see the Dr that prescribes it anymore so he is wondering if Tommi Rumps will fill it?   Pt can be reached at [phone]

## 2019-11-08 ENCOUNTER — Other Ambulatory Visit: Payer: Self-pay | Admitting: Adult Health

## 2019-11-08 DIAGNOSIS — H8113 Benign paroxysmal vertigo, bilateral: Secondary | ICD-10-CM

## 2019-11-08 DIAGNOSIS — L819 Disorder of pigmentation, unspecified: Secondary | ICD-10-CM | POA: Diagnosis not present

## 2019-11-08 DIAGNOSIS — L309 Dermatitis, unspecified: Secondary | ICD-10-CM | POA: Diagnosis not present

## 2019-11-08 DIAGNOSIS — L570 Actinic keratosis: Secondary | ICD-10-CM | POA: Diagnosis not present

## 2019-11-08 MED ORDER — BASAGLAR KWIKPEN 100 UNIT/ML ~~LOC~~ SOPN: 40.0000 [IU] | PEN_INJECTOR | Freq: Every day | SUBCUTANEOUS | 0 refills | Status: DC

## 2019-11-08 NOTE — Addendum Note (Signed)
Addended by: Miles Costain T on: 11/08/2019 12:27 PM   Modules accepted: Orders

## 2019-11-08 NOTE — Telephone Encounter (Signed)
Ok to refill for 90 days  

## 2019-11-08 NOTE — Telephone Encounter (Signed)
Sent to the pharmacy by e-scribe. 

## 2019-11-20 DIAGNOSIS — Z23 Encounter for immunization: Secondary | ICD-10-CM | POA: Diagnosis not present

## 2019-12-31 ENCOUNTER — Other Ambulatory Visit: Payer: Self-pay | Admitting: Adult Health

## 2020-01-02 ENCOUNTER — Other Ambulatory Visit: Payer: Self-pay | Admitting: Adult Health

## 2020-01-03 NOTE — Telephone Encounter (Signed)
DENIED.  PT IS NO LONGER ON THIS MEDICATION.

## 2020-01-03 NOTE — Telephone Encounter (Signed)
SENT TO THE PHARMACY ON 08/30/2019 FOR 6 MONTHS.  REQUEST IS TOO EARLY.

## 2020-01-06 ENCOUNTER — Telehealth: Payer: Self-pay | Admitting: Adult Health

## 2020-01-06 NOTE — Chronic Care Management (AMB) (Signed)
  Chronic Care Management   Outreach Note  01/06/2020 Name: Jacob Bishop MRN: [id number] DOB: 1940/11/24  Referred by: [name], NP Reason for referral : Chronic Care Management   An unsuccessful telephone outreach was attempted today. The patient was referred to the pharmacist for assistance with care management and care coordination.   Follow Up Plan:   [place]

## 2020-01-26 ENCOUNTER — Telehealth: Payer: Self-pay | Admitting: Adult Health

## 2020-01-26 DIAGNOSIS — I10 Essential (primary) hypertension: Secondary | ICD-10-CM

## 2020-01-26 DIAGNOSIS — E1165 Type 2 diabetes mellitus with hyperglycemia: Secondary | ICD-10-CM

## 2020-01-26 NOTE — Chronic Care Management (AMB) (Signed)
  Chronic Care Management   Note  01/26/2020 Name: Jacob Bishop MRN: [id number] DOB: 05-Mar-1941  Jacob Bishop is a 79 y.o. year old male who is a primary care patient of [name], NP. I reached out to Sebastian Ache by phone today in response to a referral sent by [name]'s PCP, [name], NP.   [name] was given information about Chronic Care Management services today including:  1. CCM service includes personalized support from designated clinical staff supervised by his physician, including individualized plan of care and coordination with other care providers 2. 24/7 contact phone numbers for assistance for urgent and routine care needs. 3. Service will only be billed when office clinical staff spend 20 minutes or more in a month to coordinate care. 4. Only one practitioner may furnish and bill the service in a calendar month. 5. The patient may stop CCM services at any time (effective at the end of the month) by phone call to the office staff.   Patient agreed to services and verbal consent obtained.   Follow up plan:   [place]

## 2020-02-01 ENCOUNTER — Telehealth: Payer: Self-pay | Admitting: Adult Health

## 2020-02-01 MED ORDER — BASAGLAR KWIKPEN 100 UNIT/ML ~~LOC~~ SOPN: 40.0000 [IU] | PEN_INJECTOR | Freq: Every day | SUBCUTANEOUS | 0 refills | Status: DC

## 2020-02-01 NOTE — Telephone Encounter (Signed)
SENT TO THE PHARMACY BY E-SCRIBE. 

## 2020-02-01 NOTE — Telephone Encounter (Signed)
Pt states he is out of this medication.  Medication Refill:  [place]: [place]: [phone]

## 2020-02-03 MED ORDER — INSULIN GLARGINE 100 UNITS/ML SOLOSTAR PEN: 40.0000 [IU] | PEN_INJECTOR | Freq: Every day | SUBCUTANEOUS | 0 refills | Status: DC

## 2020-02-03 NOTE — Telephone Encounter (Signed)
Pt stated that he went to the pharmacy to pick up this prescription and they told him it would be 400 dollars. Pt is wondering what else he could take instead?   Asked pt if he contacted insurance and pt stated he will wait for PCP  [facility] [address] FAX: [phone]

## 2020-02-03 NOTE — Telephone Encounter (Signed)
Likely formulary switched to Lantus - ok to send in Lantus for same  dose of basaglar

## 2020-02-03 NOTE — Addendum Note (Signed)
Addended by: Miles Costain T on: 02/03/2020 03:55 PM   Modules accepted: Orders

## 2020-02-03 NOTE — Telephone Encounter (Signed)
Rx sent to the pharmacy by e-scribe.  Pt notified by phone.  He will call back if needed.  Nothing further needed.

## 2020-02-29 DIAGNOSIS — R8279 Other abnormal findings on microbiological examination of urine: Secondary | ICD-10-CM | POA: Diagnosis not present

## 2020-02-29 DIAGNOSIS — N401 Enlarged prostate with lower urinary tract symptoms: Secondary | ICD-10-CM | POA: Diagnosis not present

## 2020-03-11 ENCOUNTER — Other Ambulatory Visit: Payer: Self-pay | Admitting: Adult Health

## 2020-03-13 NOTE — Telephone Encounter (Signed)
SENT TO THE PHARMACY BY E-SCRIBE. 

## 2020-03-19 NOTE — Chronic Care Management (AMB) (Deleted)
Chronic Care Management Pharmacy  Name: Jacob Bishop  MRN: [id number] DOB: 1941/05/13   Chief Complaint/ HPI  Jacob Bishop,  79 y.o. , male presents for their Initial CCM visit with the clinical pharmacist In office.  PCP : [name], NP Patient Care Team: [name], NP as PCP - General (Family Medicine) Constance Haw, MD as PCP - Cardiology (Cardiology) Ceasar Mons, MD as Consulting Physician (Urology) Gaynelle Arabian, MD as Consulting Physician (Orthopedic Surgery) Germaine Pomfret, [facility] as Pharmacist (Pharmacist)  Their chronic conditions include: Hypertension, Hyperlipidemia, Diabetes, Coronary Artery Disease, Osteoarthritis and Overactive Bladder   Office Visits: 10/21/19: Patient presented to Beaulah Dinning, NP for follow-up. BP in clinic 126/70, Amlodipine discontinued. Fluconazole stopped (no longer taking).  10/04/19: Patient presented to Beaulah Dinning, NP for follow-up. A1c stable at 6.0%.   Consult Visit: None in past 6 months.   Allergies  Allergen Reactions  . Lipitor [Atorvastatin Calcium] Other (See Comments)    Muscle soreness  . Oxycodone Anxiety    Causes patient to become shaky and dizzy. Unable to tolerate.    Medications: Outpatient Encounter Medications as of 03/20/2020  Medication Sig  . albuterol (PROVENTIL HFA;VENTOLIN HFA) 108 (90 Base) MCG/ACT inhaler Inhale 2 puffs into the lungs every 6 (six) hours as needed for wheezing or shortness of breath.  . BD PEN NEEDLE NANO U/F 32G X 4 MM MISC USE AS DIRECTED TWICE A DAY  . fluticasone (FLONASE) 50 MCG/ACT nasal spray Place 2 sprays into both nostrils daily as needed.  Marland Kitchen glipiZIDE (GLUCOTROL XL) 10 MG 24 hr tablet TAKE 1 TABLET (10 MG TOTAL) BY MOUTH DAILY WITH BREAKFAST.  Marland Kitchen insulin glargine (LANTUS) 100 unit/mL SOPN Inject 0.4 mLs (40 Units total) into the skin at bedtime.  Marland Kitchen ketoconazole (NIZORAL) 2 % cream Apply 1 application topically daily. Apply twice daily until  rash has resolved  . meclizine (ANTIVERT) 12.5 MG tablet Take 1 tablet (12.5 mg total) by mouth 3 (three) times daily as needed for dizziness.  Marland Kitchen omeprazole (PRILOSEC) 20 MG capsule TAKE 1 CAPSULE BY MOUTH EVERY DAY  . simvastatin (ZOCOR) 20 MG tablet TAKE 1 TABLET BY MOUTH EVERYDAY AT BEDTIME  . tamsulosin (FLOMAX) 0.4 MG CAPS capsule Take 1 capsule by mouth 2 (two) times daily.  . TOVIAZ 8 MG TB24 tablet Take 8 mg by mouth daily.   Marland Kitchen VICTOZA 18 MG/3ML SOPN INJECT 1.2MG UNDER THE SKIN EVERY DAY AS DIRECTED (Patient taking differently: Inject 1.2 mg into the skin daily. )   No facility-administered encounter medications on file as of 03/20/2020.     Current Diagnosis/Assessment:    Goals Addressed   None    Diabetes   A1c goal {A1c goals:23924}  Recent Relevant Labs: Lab Results  Component Value Date/Time   HGBA1C 6.0 10/04/2019 09:10 AM   HGBA1C 6.5 07/14/2019 01:51 PM   HGBA1C 6.0 07/13/2018 03:19 PM   GFR 77.99 10/04/2019 09:10 AM   GFR 83.12 07/14/2019 01:51 PM   MICROALBUR 4.0 (H) 09/05/2015 05:21 PM   MICROALBUR 1.5 07/13/2013 04:55 PM    Last diabetic Eye exam:  Lab Results  Component Value Date/Time   HMDIABEYEEXA No Retinopathy 12/24/2017 09:29 AM    Last diabetic Foot exam:  Lab Results  Component Value Date/Time   HMDIABFOOTEX yes 05/23/2009 12:00 AM     Checking BG: {CHL HP Blood Glucose Monitoring Frequency:[phone]}  Recent FBG Readings: *** Recent pre-meal BG readings: *** Recent 2hr PP BG readings:  ***  Recent HS BG readings: ***  Patient has failed these meds in past: Byetta, Nateglinide, metformin, saxagliptin,  Patient is currently {CHL Controlled/Uncontrolled:727 303 6823} on the following medications: Marland Kitchen Glipizide XL 10 mg daily  . Lantus 40 units QHS  . Victoza 1.2 mg daily   We discussed: {CHL HP [facility]stream Pharmacy discussion:[phone]}  Plan  Continue {CHL HP Upstream Pharmacy [phone]}  Hypertension   BP goal is:  {CHL  HP UPSTREAM Pharmacist BP ranges:[phone]}  Office blood pressures are  BP Readings from Last 3 Encounters:  10/21/19 126/70  10/04/19 108/60  07/20/19 126/60   Patient checks BP at home {CHL HP BP Monitoring Frequency:[phone]} Patient home BP readings are ranging: ***  Patient has failed these meds in the past: *** Patient is currently {CHL Controlled/Uncontrolled:727 303 6823} on the following medications:  . ***  We discussed {CHL HP Upstream Pharmacy discussion:[phone]}  Plan  Continue {CHL HP Upstream Pharmacy [phone]}   Hyperlipidemia   LDL goal < ***  Lipid Panel     Component Value Date/Time   CHOL 137 10/04/2019 0910   TRIG 155.0 (H) 10/04/2019 0910   HDL 38.50 (L) 10/04/2019 0910   LDLCALC 67 10/04/2019 0910   LDLDIRECT 86.0 09/05/2015 1721    Hepatic Function Latest Ref Rng & Units 10/04/2019 09/27/2018 03/25/2018  Total Protein 6.0 - 8.3 g/dL 6.9 7.2 6.8  Albumin 3.5 - 5.2 g/dL 4.1 4.1 4.2  AST 0 - 37 U/L 35 35 29  ALT 0 - 53 U/L 41 48(H) 46  Alk Phosphatase 39 - 117 U/L 98 60 74  Total Bilirubin 0.2 - 1.2 mg/dL 1.0 1.1 1.3(H)  Bilirubin, Direct 0.0 - 0.3 mg/dL - - -     The 10-year ASCVD risk score Mikey Bussing DC Jr., et al., 2013) is: 42.5%   Values used to calculate the score:     Age: 9 years     Sex: Male     Is Non-Hispanic African American: No     Diabetic: Yes     Tobacco smoker: No     Systolic Blood Pressure: 884 mmHg     Is BP treated: No     HDL Cholesterol: 38.5 mg/dL     Total Cholesterol: 137 mg/dL   Patient has failed these meds in past: Atorvastatin (myalgia)  Patient is currently {CHL Controlled/Uncontrolled:727 303 6823} on the following medications:  . Simvastatin 20 mg QHS   We discussed:  {CHL HP Upstream Pharmacy discussion:[phone]}  Plan  Continue {CHL HP Upstream Pharmacy [phone]}  GERD   Patient has failed these meds in past: *** Patient is currently {CHL Controlled/Uncontrolled:727 303 6823} on the  following medications:  . Omeprazole 20 mg daily   We discussed:  ***  Plan  Continue {CHL HP Upstream Pharmacy ZYSAY:[id number]}  Overactive Bladder    PSA  Date Value Ref Range Status  10/04/2019 2.69 0.10 - 4.00 ng/mL Final    Comment:    Test performed using Access Hybritech PSA Assay, a parmagnetic partical, chemiluminecent immunoassay.  01/20/2019 2.79  Final  12/30/2016 2.25  Final     Patient has failed these meds in past: *** Patient is currently {CHL Controlled/Uncontrolled:727 303 6823} on the following medications:  . Tamsulosin 0.4 mg 1 cap BID . Toviaz 8 mg daily    We discussed:  ***  Plan  Continue {CHL HP Upstream Pharmacy [phone]}   Misc / OTC    Patient has failed these meds in past: *** Patient is currently {CHL Controlled/Uncontrolled:727 303 6823} on the following medications:  . Albuterol 108 mcg/act  2 puff q6hr PRN  . Flonase 50 mcg/act 2 spray daily PRN  . Ketoconazole 2% cream . Meclizine 12.5 mg TID PRN   We discussed:  ***  Plan  Continue {CHL HP Upstream Pharmacy YSAYT:[id number]}  Vaccines   Reviewed and discussed patient's vaccination history.    Immunization History  Administered Date(s) Administered  . Influenza Split 06/22/2012  . Influenza Whole 05/23/2008, 08/03/2009, 06/04/2010  . Influenza, High Dose Seasonal PF 07/13/2013  . Influenza,inj,Quad PF,6+ Mos 06/04/2016, 07/13/2018, 06/28/2019  . Influenza,inj,quad, With Preservative 05/25/2017  . Pneumococcal Conjugate-13 06/04/2016  . Pneumococcal Polysaccharide-23 05/23/2008, 10/04/2019  . Varicella Zoster Immune Globulin 06/22/2012    Plan  Recommended patient receive *** vaccine in *** office.   Medication Management   Pt uses *** pharmacy for all medications Uses pill box? {Yes or If no, why not?:20788} Pt endorses ***% compliance  We discussed: ***  Plan  {US Pharmacy FTDD:22025}    Follow up: *** month phone visit  ***

## 2020-03-20 ENCOUNTER — Ambulatory Visit: Payer: Medicare Other

## 2020-03-20 NOTE — Chronic Care Management (AMB) (Signed)
Tommi Rumps,  Can you please place a referral for this patient to be seen for chronic care management?   Thanks, Doristine Section Clinical Pharmacist [facility] [place]

## 2020-03-27 ENCOUNTER — Telehealth: Payer: Self-pay | Admitting: Adult Health

## 2020-03-27 NOTE — Progress Notes (Signed)
  Chronic Care Management   Note  03/27/2020 Name: CHRISTEN BEDOYA MRN: [id number] DOB: May 30, 1941  FLOYED MASOUD is a 79 y.o. year old male who is a primary care patient of [name], NP. I reached out to Sebastian Ache by phone today in response to a referral sent by Mr. Jaymz Traywick Tschirhart's PCP, [name], NP.   Mr. Arizpe was given information about Chronic Care Management services today including:  1. CCM service includes personalized support from designated clinical staff supervised by his physician, including individualized plan of care and coordination with other care providers 2. 24/7 contact phone numbers for assistance for urgent and routine care needs. 3. Service will only be billed when office clinical staff spend 20 minutes or more in a month to coordinate care. 4. Only one practitioner may furnish and bill the service in a calendar month. 5. The patient may stop CCM services at any time (effective at the end of the month) by phone call to the office staff.   Patient agreed to services and verbal consent obtained.   Follow up plan:   Carley Per[facility] Sched[facility]

## 2020-04-17 ENCOUNTER — Ambulatory Visit: Payer: Medicare Other | Admitting: Pulmonary Disease

## 2020-05-08 ENCOUNTER — Other Ambulatory Visit: Payer: Self-pay | Admitting: Adult Health

## 2020-05-08 DIAGNOSIS — E785 Hyperlipidemia, unspecified: Secondary | ICD-10-CM

## 2020-05-08 DIAGNOSIS — E1165 Type 2 diabetes mellitus with hyperglycemia: Secondary | ICD-10-CM

## 2020-05-08 NOTE — Progress Notes (Signed)
Referral to CCM °

## 2020-05-08 NOTE — Chronic Care Management (AMB) (Signed)
Chronic Care Management Pharmacy  Name: Jacob Bishop  MRN: [id number] DOB: 1940-09-29   Chief Complaint/ HPI  Jacob Bishop,  79 y.o. , male presents for their Initial CCM visit with the clinical pharmacist via telephone.  PCP : [name], NP Patient Care Team: [name], NP as PCP - General (Family Medicine) Constance Haw, MD as PCP - Cardiology (Cardiology) Ceasar Mons, MD as Consulting Physician (Urology) Gaynelle Arabian, MD as Consulting Physician (Orthopedic Surgery) Germaine Pomfret, [facility] as Pharmacist (Pharmacist)  Their chronic conditions include: Hypertension, Hyperlipidemia, Diabetes, Coronary Artery Disease and Overactive Bladder   Office Visits: 10/21/19: Patient presented to [name], NP for follow-up.   Consult Visit: 11/08/19: Patient presented to Dr. Pearline Cables (Dermatology) for follow-up.   Allergies  Allergen Reactions  . Lipitor [Atorvastatin Calcium] Other (See Comments)    Muscle soreness  . Oxycodone Anxiety    Causes patient to become shaky and dizzy. Unable to tolerate.    Medications: Outpatient Encounter Medications as of 05/10/2020  Medication Sig  . fluticasone (FLONASE) 50 MCG/ACT nasal spray Place 2 sprays into both nostrils daily as needed.  Marland Kitchen glipiZIDE (GLUCOTROL XL) 10 MG 24 hr tablet TAKE 1 TABLET (10 MG TOTAL) BY MOUTH DAILY WITH BREAKFAST.  Marland Kitchen insulin glargine (LANTUS) 100 unit/mL SOPN Inject 0.4 mLs (40 Units total) into the skin at bedtime.  . meclizine (ANTIVERT) 12.5 MG tablet Take 1 tablet (12.5 mg total) by mouth 3 (three) times daily as needed for dizziness.  Marland Kitchen oxybutynin (DITROPAN-XL) 10 MG 24 hr tablet Take 10 mg by mouth daily.  . simvastatin (ZOCOR) 20 MG tablet TAKE 1 TABLET BY MOUTH EVERYDAY AT BEDTIME  . tamsulosin (FLOMAX) 0.4 MG CAPS capsule Take 1 capsule by mouth 2 (two) times daily.  Marland Kitchen VICTOZA 18 MG/3ML SOPN INJECT 1.2MG UNDER THE SKIN EVERY DAY AS DIRECTED  . albuterol (PROVENTIL  HFA;VENTOLIN HFA) 108 (90 Base) MCG/ACT inhaler Inhale 2 puffs into the lungs every 6 (six) hours as needed for wheezing or shortness of breath. (Patient not taking: Reported on 05/10/2020)  . BD PEN NEEDLE NANO U/F 32G X 4 MM MISC USE AS DIRECTED TWICE A DAY  . ketoconazole (NIZORAL) 2 % cream Apply 1 application topically daily. Apply twice daily until rash has resolved (Patient not taking: Reported on 05/10/2020)  . omeprazole (PRILOSEC) 20 MG capsule TAKE 1 CAPSULE BY MOUTH EVERY DAY  . TOVIAZ 8 MG TB24 tablet Take 8 mg by mouth daily.    No facility-administered encounter medications on file as of 05/10/2020.    Wt Readings from Last 3 Encounters:  10/21/19 260 lb (117.9 kg)  10/04/19 260 lb (117.9 kg)  07/20/19 268 lb (121.6 kg)    Current Diagnosis/Assessment:  SDOH Interventions     Most Recent Value  SDOH Interventions  Financial Strain Interventions Other (Comment)  [Will start PAP]  Transportation Interventions Intervention Not Indicated      Goals Addressed            This Visit's Progress   . Chronic Care Management       CARE PLAN ENTRY (see longitudinal plan of care for additional care plan information)  Current Barriers:  . Chronic Disease Management support, education, and care coordination needs related to Hypertension, Hyperlipidemia, Diabetes, Coronary Artery Disease and Overactive Bladder    Hypertension BP Readings from Last 3 Encounters:  10/21/19 126/70  10/04/19 108/60  07/20/19 126/60   . Pharmacist Clinical Goal(s): o Over the  next 90 days, patient will work with PharmD and providers to maintain BP goal <130/80 . Current regimen:  o None . Interventions: o Discussed low salt diet and exercising as tolerated extensively . Patient self care activities - Over the next 90 days, patient will: o Ensure daily salt intake < 2300 mg/day  Hyperlipidemia Lab Results  Component Value Date/Time   LDLCALC 67 10/04/2019 09:10 AM   LDLDIRECT 86.0  09/05/2015 05:21 PM   . Pharmacist Clinical Goal(s): o Over the next 90 days, patient will work with PharmD and providers to maintain LDL goal < 70 . Current regimen:  o Simvastatin 20 mg daily  . Interventions: o Discussed low cholesterol diet and exercising as tolerated extensively o Will initiate cholesterol monitoring plan   Diabetes Lab Results  Component Value Date/Time   HGBA1C 6.0 10/04/2019 09:10 AM   HGBA1C 6.5 07/14/2019 01:51 PM   HGBA1C 6.0 07/13/2018 03:19 PM   . Pharmacist Clinical Goal(s): o Over the next 90 days, patient will work with PharmD and providers to maintain A1c goal <7% . Current regimen:  Marland Kitchen Glipizide XL 10 mg daily  . Lantus 40 units nightly  . Victoza 1.2 mg daily  . Interventions: o Discussed carbohydrate counting and exercising as tolerated extensively o Will initiate blood sugar monitoring plan  o Recommend stopping Glipizide o Recommend switching Victoza to Ozempic 0.5 mg weekly o Will start PAP for Ozempic and Lantus . Patient self care activities - Over the next 90 days, patient will: o Check blood sugar twice daily, document, and provide at future appointments o Contact provider with any episodes of hypoglycemia  Medication management . Pharmacist Clinical Goal(s): o Over the next 90 days, patient will work with PharmD and providers to achieve optimal medication adherence . [facility] . Interventions o Comprehensive medication review performed. o Utilize [facility] for medication synchronization, packaging and delivery o Verbal consent obtained for [facility] enhanced pharmacy services (medication synchronization, adherence packaging, delivery coordination). A medication sync plan was created to allow patient to get all medications delivered once every 30 to 90 days per patient preference. Patient understands they have freedom to choose pharmacy and clinical pharmacist will coordinate care between all prescribers  and [facility]. . Patient self care activities - Over the next 90 days, patient will: o Take medications as prescribed o Report any questions or concerns to PharmD and/or provider(s)      Diabetes   A1c goal <7%  Recent Relevant Labs: Lab Results  Component Value Date/Time   HGBA1C 6.0 10/04/2019 09:10 AM   HGBA1C 6.5 07/14/2019 01:51 PM   HGBA1C 6.0 07/13/2018 03:19 PM   GFR 77.99 10/04/2019 09:10 AM   GFR 83.12 07/14/2019 01:51 PM   MICROALBUR 4.0 (H) 09/05/2015 05:21 PM   MICROALBUR 1.5 07/13/2013 04:55 PM    Last diabetic Eye exam:  Lab Results  Component Value Date/Time   HMDIABEYEEXA No Retinopathy 12/24/2017 09:29 AM    Last diabetic Foot exam:  Lab Results  Component Value Date/Time   HMDIABFOOTEX yes 05/23/2009 12:00 AM     Checking BG: Three times weekly   Recent FBG Readings: 140 Recent pre-meal BG readings: n/a Recent 2hr PP BG readings:  n/a Recent HS BG readings: n/a  Patient has failed these meds in past: n/a Patient is currently controlled on the following medications: . Glipzide XL 10 mg daily  . Lantus 40 units QHS  . Victoza 1.2 mg daily   We discussed: diet  and exercise extensively. Patient denies hypoglycemia symptoms. Patient endorses affordability concerns with Lantus + Victoza. Patient asking about whether he could take a once weekly injection instead of Victoza.   Plan  Recommend stopping glipizide to simplify regimen and minimize risk of hypoglycemia with insulin.  Recommend stopping Victoza and starting Ozempic 0.5 mg weekly to ease administration burden. Recommend urine microalbumin.   Will Start PAP for Ozempic/Victoza and Lantus.   Hypertension   BP goal is:  <130/80  Office blood pressures are  BP Readings from Last 3 Encounters:  10/21/19 126/70  10/04/19 108/60  07/20/19 126/60   Patient checks BP at home Never Patient home BP readings are ranging: n/a  Patient has failed these meds in the past: Amlodipine,    Patient is currently controlled on the following medications:  . None  We discussed diet and exercise extensively.   Plan  Continue control with diet and exercise   Hyperlipidemia   [place] 1990s.   LDL goal < 70  Lipid Panel     Component Value Date/Time   CHOL 137 10/04/2019 0910   TRIG 155.0 (H) 10/04/2019 0910   HDL 38.50 (L) 10/04/2019 0910   LDLCALC 67 10/04/2019 0910   LDLDIRECT 86.0 09/05/2015 1721    Hepatic Function Latest Ref Rng & Units 10/04/2019 09/27/2018 03/25/2018  Total Protein 6.0 - 8.3 g/dL 6.9 7.2 6.8  Albumin 3.5 - 5.2 g/dL 4.1 4.1 4.2  AST 0 - 37 U/L 35 35 29  ALT 0 - 53 U/L 41 48(H) 46  Alk Phosphatase 39 - 117 U/L 98 60 74  Total Bilirubin 0.2 - 1.2 mg/dL 1.0 1.1 1.3(H)  Bilirubin, Direct 0.0 - 0.3 mg/dL - - -     The 10-year ASCVD risk score Mikey Bussing DC Jr., et al., 2013) is: 42.5%   Values used to calculate the score:     Age: 26 years     Sex: Male     Is Non-Hispanic African American: No     Diabetic: Yes     Tobacco smoker: No     Systolic Blood Pressure: 371 mmHg     Is BP treated: No     HDL Cholesterol: 38.5 mg/dL     Total Cholesterol: 137 mg/dL   Patient has failed these meds in past: n/a Patient is currently controlled on the following medications:  . Simvastatin 20 mg QHS  We discussed:  diet and exercise extensively  Plan  Continue current medications  GERD   Patient denies dysphagia, heartburn or nausea. Expresses understanding to avoid triggers such as citrus juices, large meals and lying down after eating.  Currently controlled on: . Omeprazole 20 mg daily (hasn't taken in many months)  Plan   Recommend stopping omeprazole (patient no longer using)  BPH   Managed by Dr. Lovena Neighbours   PSA  Date Value Ref Range Status  10/04/2019 2.69 0.10 - 4.00 ng/mL Final    Comment:    Test performed using Access Hybritech PSA Assay, a parmagnetic partical, chemiluminecent immunoassay.  01/20/2019 2.79  Final  12/30/2016 2.25   Final     Patient has failed these meds in past: Toviaz (cost) Patient is currently controlled on the following medications:  . Tamsulosin 0.4 mg twice daily  . Oxybutynin ER 10 mg daily   We discussed:  Wakes up 2 times nightly. Tolerating switch to oxybutynin well.   Plan  Continue current medications  Allergic Rhinitis    Patient has failed these meds in  past: n/a Patient is currently controlled on the following medications:  . Albuterol HFA (Allergy, mostly in Spring)  . Flonase 2 sprays daily PRN  We discussed:  Well controlled, outside of occasional seasonal flares.  Plan  Continue current medications  Misc / OTC    . Ketoconazole 2% cream . Meclizine 12.5 mg TID PRN (poor balance/dizziness) - takes daily  Plan  Continue current medications  Vaccines   Reviewed and discussed patient's vaccination history.    Immunization History  Administered Date(s) Administered  . Influenza Split 06/22/2012  . Influenza Whole 05/23/2008, 08/03/2009, 06/04/2010  . Influenza, High Dose Seasonal PF 07/13/2013  . Influenza,inj,Quad PF,6+ Mos 06/04/2016, 07/13/2018, 06/28/2019  . Influenza,inj,quad, With Preservative 05/25/2017  . Pneumococcal Conjugate-13 06/04/2016  . Pneumococcal Polysaccharide-23 05/23/2008, 10/04/2019  . Varicella Zoster Immune Globulin 06/22/2012   Patient received Pfizer Covid vaccines 3/7 and 3/28.   Medication Management   [facility] Uses pill box? No - Hasn't set one up.   Pt endorses 95% adherence. Misses medications 2-3 times yearly.  We discussed: n/a  Plan  Utilize [facility] for medication synchronization, packaging and delivery   Verbal consent obtained for [facility] enhanced pharmacy services (medication synchronization, adherence packaging, delivery coordination). A medication sync plan was created to allow patient to get all medications delivered once every 30 to 90 days per patient  preference. Patient understands they have freedom to choose pharmacy and clinical pharmacist will coordinate care between all prescribers and [facility].  Follow up: 6 month phone visit  [facility] [place]

## 2020-05-09 ENCOUNTER — Telehealth: Payer: Self-pay

## 2020-05-09 NOTE — Progress Notes (Signed)
Chronic Care Management Pharmacy Assistant   Name: Jacob Bishop  MRN: [id number] DOB: Sep 23, 1940  Reason for Encounter: Medication Review/ initial question for initial visit with clinical pharmacist.    PCP : [name], NP  Allergies:   Allergies  Allergen Reactions  . Lipitor [Atorvastatin Calcium] Other (See Comments)    Muscle soreness  . Oxycodone Anxiety    Causes patient to become shaky and dizzy. Unable to tolerate.    Medications: Outpatient Encounter Medications as of 05/09/2020  Medication Sig  . albuterol (PROVENTIL HFA;VENTOLIN HFA) 108 (90 Base) MCG/ACT inhaler Inhale 2 puffs into the lungs every 6 (six) hours as needed for wheezing or shortness of breath.  . BD PEN NEEDLE NANO U/F 32G X 4 MM MISC USE AS DIRECTED TWICE A DAY  . fluticasone (FLONASE) 50 MCG/ACT nasal spray Place 2 sprays into both nostrils daily as needed.  Marland Kitchen glipiZIDE (GLUCOTROL XL) 10 MG 24 hr tablet TAKE 1 TABLET (10 MG TOTAL) BY MOUTH DAILY WITH BREAKFAST.  Marland Kitchen insulin glargine (LANTUS) 100 unit/mL SOPN Inject 0.4 mLs (40 Units total) into the skin at bedtime.  Marland Kitchen ketoconazole (NIZORAL) 2 % cream Apply 1 application topically daily. Apply twice daily until rash has resolved  . meclizine (ANTIVERT) 12.5 MG tablet Take 1 tablet (12.5 mg total) by mouth 3 (three) times daily as needed for dizziness.  Marland Kitchen omeprazole (PRILOSEC) 20 MG capsule TAKE 1 CAPSULE BY MOUTH EVERY DAY  . simvastatin (ZOCOR) 20 MG tablet TAKE 1 TABLET BY MOUTH EVERYDAY AT BEDTIME  . tamsulosin (FLOMAX) 0.4 MG CAPS capsule Take 1 capsule by mouth 2 (two) times daily.  . TOVIAZ 8 MG TB24 tablet Take 8 mg by mouth daily.   Marland Kitchen VICTOZA 18 MG/3ML SOPN INJECT 1.2MG  UNDER THE SKIN EVERY DAY AS DIRECTED (Patient taking differently: Inject 1.2 mg into the skin daily. )   No facility-administered encounter medications on file as of 05/09/2020.    Current Diagnosis: Patient Active Problem List   Diagnosis Date Noted  . OA  (osteoarthritis) of knee 10/04/2018  . Other secondary pulmonary hypertension ([place]) 04/06/2018  . Osteoarthritis, hand 05/05/2014  . Carpal tunnel syndrome 05/02/2014  . Eczema 01/23/2014  . Thrombocytopenia, unspecified ([place]) 11/19/2012  . Overactive bladder 08/24/2012  . Osteoarthritis of right knee 01/22/2012  . Benign positional vertigo 07/09/2011  . CAD, NATIVE VESSEL 10/22/2009  . EDEMA 08/03/2009  . Obstructive sleep apnea 05/03/2009  . SKIN LESION 09/23/2007  . Diabetes mellitus type 2, uncontrolled ([place]) 03/23/2007  . Hyperlipidemia 03/23/2007  . Essential hypertension 03/23/2007  . COLONIC POLYPS, HX OF 03/23/2007  . Morbid obesity ([place]) 03/23/2007      Follow-Up:  Pharmacist Review   Have you seen any other providers since your last visit? no Any changes in your medications or health? no Any side effects from any medications? no Do you have an symptoms or problems not managed by your medications? no Any concerns about your health right now? Yes  Patient states he had knee replacement and ever since then he is unable to have  good balance.  Has your provider asked that you check blood pressure, blood sugar, or follow special diet at home? No  Patient reports he does checks his blood sugar at home but does not keep a log.  Patient states he "eats whatever he can when he can". Do you get any type of exercise on a regular basis? no Can you think of a goal you would like  to reach for your health?   Patient states he would like to restore his balance and strength. Do you have any problems getting your medications? Yes  Patient states some of his medication can be expensive. Is there anything that you would like to discuss during the appointment?   Patient states he would like to discuss balance, and patient assistance to help with expenses of his medications.  Please bring medications and supplements to appointment  [facility] Pharmacist  Assistant [phone]

## 2020-05-10 ENCOUNTER — Ambulatory Visit: Payer: Medicare Other

## 2020-05-10 DIAGNOSIS — E1165 Type 2 diabetes mellitus with hyperglycemia: Secondary | ICD-10-CM

## 2020-05-10 DIAGNOSIS — I10 Essential (primary) hypertension: Secondary | ICD-10-CM

## 2020-05-11 ENCOUNTER — Telehealth: Payer: Self-pay

## 2020-05-11 NOTE — Progress Notes (Signed)
    Chronic Care Management Pharmacy Assistant   Name: Jacob Bishop  MRN: [id number] DOB: [birth year]  Reason for Encounter: Medication Review/Patient assistance for Ozempic.  PCP : [name], NP  Allergies:   Allergies  Allergen Reactions  . Lipitor [Atorvastatin Calcium] Other (See Comments)    Muscle soreness  . Oxycodone Anxiety    Causes patient to become shaky and dizzy. Unable to tolerate.    Medications: Outpatient Encounter Medications as of 05/11/2020  Medication Sig  . albuterol (PROVENTIL HFA;VENTOLIN HFA) 108 (90 Base) MCG/ACT inhaler Inhale 2 puffs into the lungs every 6 (six) hours as needed for wheezing or shortness of breath. (Patient not taking: Reported on 05/10/2020)  . BD PEN NEEDLE NANO U/F 32G X 4 MM MISC USE AS DIRECTED TWICE A DAY  . fluticasone (FLONASE) 50 MCG/ACT nasal spray Place 2 sprays into both nostrils daily as needed.  Marland Kitchen glipiZIDE (GLUCOTROL XL) 10 MG 24 hr tablet TAKE 1 TABLET (10 MG TOTAL) BY MOUTH DAILY WITH BREAKFAST.  Marland Kitchen insulin glargine (LANTUS) 100 unit/mL SOPN Inject 0.4 mLs (40 Units total) into the skin at bedtime.  Marland Kitchen ketoconazole (NIZORAL) 2 % cream Apply 1 application topically daily. Apply twice daily until rash has resolved (Patient not taking: Reported on 05/10/2020)  . meclizine (ANTIVERT) 12.5 MG tablet Take 1 tablet (12.5 mg total) by mouth 3 (three) times daily as needed for dizziness.  Marland Kitchen omeprazole (PRILOSEC) 20 MG capsule TAKE 1 CAPSULE BY MOUTH EVERY DAY  . simvastatin (ZOCOR) 20 MG tablet TAKE 1 TABLET BY MOUTH EVERYDAY AT BEDTIME  . tamsulosin (FLOMAX) 0.4 MG CAPS capsule Take 1 capsule by mouth 2 (two) times daily.  . TOVIAZ 8 MG TB24 tablet Take 8 mg by mouth daily.   Marland Kitchen VICTOZA 18 MG/3ML SOPN INJECT 1.2MG  UNDER THE SKIN EVERY DAY AS DIRECTED   No facility-administered encounter medications on file as of 05/11/2020.    Current Diagnosis: Patient Active Problem List   Diagnosis Date Noted  . OA (osteoarthritis) of knee  10/04/2018  . Other secondary pulmonary hypertension ([place]) 04/06/2018  . Osteoarthritis, hand 05/05/2014  . Carpal tunnel syndrome 05/02/2014  . Eczema 01/23/2014  . Thrombocytopenia, unspecified ([place]) 11/19/2012  . Overactive bladder 08/24/2012  . Osteoarthritis of right knee 01/22/2012  . Benign positional vertigo 07/09/2011  . CAD, NATIVE VESSEL 10/22/2009  . EDEMA 08/03/2009  . Obstructive sleep apnea 05/03/2009  . SKIN LESION 09/23/2007  . Diabetes mellitus type 2, uncontrolled ([place]) 03/23/2007  . Hyperlipidemia 03/23/2007  . Essential hypertension 03/23/2007  . COLONIC POLYPS, HX OF 03/23/2007  . Morbid obesity ([place]) 03/23/2007    Goals Addressed   None     Follow-Up:  Patient Assistance Coordination   Spoke to patient to inform him that we are sending him a Patient assistance form  for Ozempic  by mail.Informed patient to include a copy of his proof of income AND a copy of his Explanation of Benefits (EOB) statement from her insurance.Advised patient to return the PAP forms back to the [place] office.  [facility] Pharmacist Assistant [phone]

## 2020-05-14 ENCOUNTER — Telehealth: Payer: Self-pay

## 2020-05-14 NOTE — Patient Instructions (Addendum)
Visit Information It was great speaking with you today!  Please let me know if you have any questions about our visit.  Goals Addressed            This Visit's Progress   . Chronic Care Management       CARE PLAN ENTRY (see longitudinal plan of care for additional care plan information)  Current Barriers:  . Chronic Disease Management support, education, and care coordination needs related to Hypertension, Hyperlipidemia, Diabetes, Coronary Artery Disease and Overactive Bladder    Hypertension BP Readings from Last 3 Encounters:  10/21/19 126/70  10/04/19 108/60  07/20/19 126/60   . Pharmacist Clinical Goal(s): o Over the next 90 days, patient will work with PharmD and providers to maintain BP goal <130/80 . Current regimen:  o None . Interventions: o Discussed low salt diet and exercising as tolerated extensively . Patient self care activities - Over the next 90 days, patient will: o Ensure daily salt intake < 2300 mg/day  Hyperlipidemia Lab Results  Component Value Date/Time   LDLCALC 67 10/04/2019 09:10 AM   LDLDIRECT 86.0 09/05/2015 05:21 PM   . Pharmacist Clinical Goal(s): o Over the next 90 days, patient will work with PharmD and providers to maintain LDL goal < 70 . Current regimen:  o Simvastatin 20 mg daily  . Interventions: o Discussed low cholesterol diet and exercising as tolerated extensively o Will initiate cholesterol monitoring plan   Diabetes Lab Results  Component Value Date/Time   HGBA1C 6.0 10/04/2019 09:10 AM   HGBA1C 6.5 07/14/2019 01:51 PM   HGBA1C 6.0 07/13/2018 03:19 PM   . Pharmacist Clinical Goal(s): o Over the next 90 days, patient will work with PharmD and providers to maintain A1c goal <7% . Current regimen:  Marland Kitchen Glipizide XL 10 mg daily  . Lantus 40 units nightly  . Victoza 1.2 mg daily  . Interventions: o Discussed carbohydrate counting and exercising as tolerated extensively o Will initiate blood sugar monitoring plan   o Recommend stopping Glipizide o Recommend switching Victoza to Ozempic 0.5 mg weekly o Will start PAP for Ozempic and Lantus . Patient self care activities - Over the next 90 days, patient will: o Check blood sugar twice daily, document, and provide at future appointments o Contact provider with any episodes of hypoglycemia  Medication management . Pharmacist Clinical Goal(s): o Over the next 90 days, patient will work with PharmD and providers to achieve optimal medication adherence . Current pharmacy: [facility] . Interventions o Comprehensive medication review performed. o Utilize [facility] for medication synchronization, packaging and delivery o Verbal consent obtained for [facility] enhanced pharmacy services (medication synchronization, adherence packaging, delivery coordination). A medication sync plan was created to allow patient to get all medications delivered once every 30 to 90 days per patient preference. Patient understands they have freedom to choose pharmacy and clinical pharmacist will coordinate care between all prescribers and [facility]. . Patient self care activities - Over the next 90 days, patient will: o Take medications as prescribed o Report any questions or concerns to PharmD and/or provider(s)       Mr. Bilotta was given information about Chronic Care Management services today including:  1. CCM service includes personalized support from designated clinical staff supervised by his physician, including individualized plan of care and coordination with other care providers 2. 24/7 contact phone numbers for assistance for urgent and routine care needs. 3. Standard insurance, coinsurance, copays and deductibles apply for chronic care management only  during months in which we provide at least 20 minutes of these services. Most insurances cover these services at 100%, however patients may be responsible for any copay, coinsurance and/or deductible if  applicable. This service may help you avoid the need for more expensive face-to-face services. 4. Only one practitioner may furnish and bill the service in a calendar month. 5. The patient may stop CCM services at any time (effective at the end of the month) by phone call to the office staff.  Patient agreed to services and verbal consent obtained.   The patient verbalized understanding of instructions provided today and agreed to receive a mailed copy of patient instruction and/or educational materials. Telephone follow up appointment with pharmacy team member scheduled for: 11/14/20 at 2:00 PM  [facility] [place]   [place] stands for "Dietary Approaches to Stop Hypertension." The DASH eating plan is a healthy eating plan that has been shown to reduce high blood pressure (hypertension). It may also reduce your risk for type 2 diabetes, heart disease, and stroke. The DASH eating plan may also help with weight loss. What are tips for following this plan?  General guidelines  Avoid eating more than 2,300 mg (milligrams) of salt (sodium) a day. If you have hypertension, you may need to reduce your sodium intake to 1,500 mg a day.  Limit alcohol intake to no more than 1 drink a day for nonpregnant women and 2 drinks a day for men. One drink equals 12 oz of beer, 5 oz of wine, or 1 oz of hard liquor.  Work with your health care provider to maintain a healthy body weight or to lose weight. Ask what an ideal weight is for you.  Get at least 30 minutes of exercise that causes your heart to beat faster (aerobic exercise) most days of the week. Activities may include walking, swimming, or biking.  Work with your health care provider or diet and nutrition specialist (dietitian) to adjust your eating plan to your individual calorie needs. Reading food labels   Check food labels for the amount of sodium per serving.  Choose foods with less than 5 percent of the Daily Value of sodium. Generally, foods with less than 300 mg of sodium per serving fit into this eating plan.  To find whole grains, look for the word "whole" as the first word in the ingredient list. Shopping  Buy products labeled as "low-sodium" or "no salt added."  Buy fresh foods. Avoid canned foods and premade or frozen meals. Cooking  Avoid adding salt when cooking. Use salt-free seasonings or herbs instead of table salt or sea salt. Check with your health care provider or pharmacist before using salt substitutes.  Do not fry foods. Cook foods using healthy methods such as baking, boiling, grilling, and broiling instead.  Cook with heart-healthy oils, such as olive, canola, soybean, or sunflower oil. Meal planning  Eat a balanced diet that includes: ? 5 or more servings of fruits and vegetables each day. At each meal, try to fill half of your plate with fruits and vegetables. ? Up to 6-8 servings of whole grains each day. ? Less than 6 oz of lean meat, poultry, or fish each day. A 3-oz serving of meat is about the same size as a deck of cards. One egg equals 1 oz. ? 2 servings of low-fat dairy each day. ? A serving of nuts, seeds, or beans 5 times each week. ?  Heart-healthy fats. Healthy fats called Omega-3 fatty acids are found in foods such as flaxseeds and coldwater fish, like sardines, salmon, and mackerel.  Limit how much you eat of the following: ? Canned or prepackaged foods. ? Food that is high in trans fat, such as fried foods. ? Food that is high in saturated fat, such as fatty meat. ? Sweets, desserts, sugary drinks, and other foods with added sugar. ? Full-fat dairy products.  Do not salt foods before eating.  Try to eat at least 2 vegetarian meals each week.  Eat more home-cooked food and less restaurant, buffet, and fast food.  When eating at a restaurant, ask that your food be prepared with less salt or no salt,  if possible. What foods are recommended? The items listed may not be a complete list. Talk with your dietitian about what dietary choices are best for you. Grains Whole-grain or whole-wheat bread. Whole-grain or whole-wheat pasta. Brown rice. [place]. Bulgur. Whole-grain and low-sodium cereals. Pita bread. Low-fat, low-sodium crackers. Whole-wheat flour tortillas. Vegetables Fresh or frozen vegetables (raw, steamed, roasted, or grilled). Low-sodium or reduced-sodium tomato and vegetable juice. Low-sodium or reduced-sodium tomato sauce and tomato paste. Low-sodium or reduced-sodium canned vegetables. Fruits All fresh, dried, or frozen fruit. Canned fruit in natural juice (without added sugar). Meat and other protein foods Skinless chicken or [place]. Ground chicken or [place]. Pork with fat trimmed off. Fish and seafood. Egg whites. Dried beans, peas, or lentils. Unsalted nuts, nut butters, and seeds. Unsalted canned beans. Lean cuts of beef with fat trimmed off. Low-sodium, lean deli meat. Dairy Low-fat (1%) or fat-free (skim) milk. Fat-free, low-fat, or reduced-fat cheeses. Nonfat, low-sodium ricotta or cottage cheese. Low-fat or nonfat yogurt. Low-fat, low-sodium cheese. Fats and oils Soft margarine without trans fats. Vegetable oil. Low-fat, reduced-fat, or light mayonnaise and salad dressings (reduced-sodium). Canola, safflower, olive, soybean, and sunflower oils. Avocado. Seasoning and other foods Herbs. Spices. Seasoning mixes without salt. Unsalted popcorn and pretzels. Fat-free sweets. What foods are not recommended? The items listed may not be a complete list. Talk with your dietitian about what dietary choices are best for you. Grains Baked goods made with fat, such as croissants, muffins, or some breads. Dry pasta or rice meal packs. Vegetables Creamed or fried vegetables. Vegetables in a cheese sauce. Regular canned vegetables (not low-sodium or reduced-sodium). Regular canned  tomato sauce and paste (not low-sodium or reduced-sodium). Regular tomato and vegetable juice (not low-sodium or reduced-sodium). Angie Fava. Olives. Fruits Canned fruit in a light or heavy syrup. Fried fruit. Fruit in cream or butter sauce. Meat and other protein foods Fatty cuts of meat. Ribs. Fried meat. Berniece Salines. Sausage. Bologna and other processed lunch meats. Salami. Fatback. Hotdogs. Bratwurst. Salted nuts and seeds. Canned beans with added salt. Canned or smoked fish. Whole eggs or egg yolks. Chicken or [place] with skin. Dairy Whole or 2% milk, cream, and half-and-half. Whole or full-fat cream cheese. Whole-fat or sweetened yogurt. Full-fat cheese. Nondairy creamers. Whipped toppings. Processed cheese and cheese spreads. Fats and oils Butter. Stick margarine. Lard. Shortening. Ghee. Bacon fat. Tropical oils, such as coconut, palm kernel, or palm oil. Seasoning and other foods Salted popcorn and pretzels. Onion salt, garlic salt, seasoned salt, table salt, and sea salt. Worcestershire sauce. Tartar sauce. Barbecue sauce. Teriyaki sauce. Soy sauce, including reduced-sodium. Steak sauce. Canned and packaged gravies. Fish sauce. Oyster sauce. Cocktail sauce. Horseradish that you find on the shelf. Ketchup. Mustard. Meat flavorings and tenderizers. Bouillon cubes. Hot sauce and Tabasco sauce.  Premade or packaged marinades. Premade or packaged taco seasonings. Relishes. Regular salad dressings. Where to find more information:  National Heart, Lung, and [place]: https://wilson-eaton.com/  American Heart Association: www.heart.org Summary  The DASH eating plan is a healthy eating plan that has been shown to reduce high blood pressure (hypertension). It may also reduce your risk for type 2 diabetes, heart disease, and stroke.  With the DASH eating plan, you should limit salt (sodium) intake to 2,300 mg a day. If you have hypertension, you may need to reduce your sodium intake to 1,500 mg a day.  When  on the DASH eating plan, aim to eat more fresh fruits and vegetables, whole grains, lean proteins, low-fat dairy, and heart-healthy fats.  Work with your health care provider or diet and nutrition specialist (dietitian) to adjust your eating plan to your individual calorie needs. This information is not intended to replace advice given to you by your health care provider. Make sure you discuss any questions you have with your health care provider. Document Revised: 07/24/2017 Document Reviewed: 08/04/2016 Elsevier Patient Education  2020 [employer].

## 2020-05-14 NOTE — Progress Notes (Signed)
Spoke with patient to go over current medication for onboarding [facility].  [place] Pharmacist Assistant [phone]

## 2020-05-16 ENCOUNTER — Telehealth: Payer: Self-pay

## 2020-05-16 NOTE — Progress Notes (Signed)
Will do!

## 2020-05-16 NOTE — Progress Notes (Signed)
Ok I will let him know to schedule a follow-up visit with you. Do you want to wait until that visit to discuss these changes?

## 2020-05-16 NOTE — Progress Notes (Signed)
Please reach out to Jacob Bishop and let him know that after talking with Tommi Rumps, we are going to plan to have him continue his medications for now. Patient is due for an office visit with Tommi Rumps to get labwork and Tommi Rumps will want to speak with him prior to making any large changes to his medications.   Please inform him to call the [place] clinic and schedule an office visit with [facility].   We will still work on getting the paperwork approved for his insulin and Victoza in the meantime.   Thanks, Doristine Section Clinical Pharmacist [facility] [place]

## 2020-05-16 NOTE — Progress Notes (Signed)
Spoke to patient per Junius Argyle the clinical Pharmacist, after talking with his PCP [facility], they would like the patient to continue his medications for now. Patient is due for an office visit with Tommi Rumps to get labwork and Tommi Rumps will want to speak with him prior to making any large changes to his medications. I Inform patient to call the [place] clinic and schedule an office visit with his PCP [facility].   I informed patient that we still work on getting the paperwork approved for his insulin and Victoza in the meantime.    Patient verbalized understanding.  [place] Pharmacist Assistant [phone]

## 2020-05-18 ENCOUNTER — Telehealth: Payer: Self-pay

## 2020-05-18 DIAGNOSIS — H8113 Benign paroxysmal vertigo, bilateral: Secondary | ICD-10-CM

## 2020-05-18 MED ORDER — MECLIZINE HCL 12.5 MG PO TABS: 12.5000 mg | ORAL_TABLET | Freq: Three times a day (TID) | ORAL | 1 refills | Status: DC | PRN

## 2020-05-18 MED ORDER — BD PEN NEEDLE NANO U/F 32G X 4 MM MISC: 0 refills | Status: DC

## 2020-05-18 NOTE — Telephone Encounter (Signed)
-----   Message from Germaine Pomfret, [facility] sent at 05/17/2020 12:16 PM EDT ----- Regarding: Medication Refill Misty,  Patient is requesting refills of Meclizine 25 mg and his Pen Needles. Can you please send a refill for those medications in to [facility] so we may fill them for him?   Thanks, Doristine Section Clinical Pharmacist [facility] [place]

## 2020-05-19 ENCOUNTER — Other Ambulatory Visit: Payer: Self-pay | Admitting: Adult Health

## 2020-05-21 ENCOUNTER — Other Ambulatory Visit: Payer: Self-pay

## 2020-05-21 DIAGNOSIS — E11649 Type 2 diabetes mellitus with hypoglycemia without coma: Secondary | ICD-10-CM

## 2020-05-21 MED ORDER — VICTOZA 18 MG/3ML ~~LOC~~ SOPN: 1.2000 mg | PEN_INJECTOR | Freq: Every day | SUBCUTANEOUS | 1 refills | Status: DC

## 2020-05-25 DIAGNOSIS — Z23 Encounter for immunization: Secondary | ICD-10-CM | POA: Diagnosis not present

## 2020-05-30 ENCOUNTER — Other Ambulatory Visit: Payer: Self-pay

## 2020-05-30 ENCOUNTER — Encounter: Payer: Self-pay | Admitting: Adult Health

## 2020-05-30 ENCOUNTER — Ambulatory Visit (INDEPENDENT_AMBULATORY_CARE_PROVIDER_SITE_OTHER): Payer: Medicare Other | Admitting: Adult Health

## 2020-05-30 ENCOUNTER — Ambulatory Visit: Payer: Medicare Other | Admitting: Pulmonary Disease

## 2020-05-30 VITALS — BP 117/78 | HR 65 | Temp 97.8°F | Resp 13 | Ht 71.0 in | Wt 266.6 lb

## 2020-05-30 DIAGNOSIS — Z23 Encounter for immunization: Secondary | ICD-10-CM

## 2020-05-30 DIAGNOSIS — E1165 Type 2 diabetes mellitus with hyperglycemia: Secondary | ICD-10-CM | POA: Diagnosis not present

## 2020-05-30 NOTE — Progress Notes (Signed)
Subjective:    Patient ID: Jacob Bishop, male    DOB: 1941-02-02, 79 y.o.   MRN: [id number]  HPI 79 year old male who  has a past medical history of AI (aortic insufficiency) (01/11/2018), Arthritis, CAD in native artery, Cellulitis, Dermatitis, Diabetes mellitus, Diastolic dysfunction (81/08/7508), Diverticulosis (01/31/2016), DOE (dyspnea on exertion), Edema, Fatty liver (09/18/2004), History of kidney stones (05/23/2002), colonic polyps (01/31/2016), Hyperlipidemia, Hypertension, Internal hemorrhoids (01/31/2016), Obesity, OSA (obstructive sleep apnea), Positional vertigo, Pulmonary hypertension ([place]) (01/11/2018), Rhinosinusitis, Skin lesion, and TR (tricuspid regurgitation) (01/11/2018).  He presents to the office today for follow up regarding DM. He is currently maintained on glipizide 10 mg XR, Victoza 1.2 mg daily and Basaglar 40 units daily. He denies issues with hypoglycemia but does not check his BS on a routine basis. His weight is up 6 pounds and he reports this is been due to sedentary lifestyle.   Lab Results  Component Value Date   HGBA1C 6.0 10/04/2019   After speaking to our clinical pharmacist he reported concerns with affordability with Lantus and Victoza. He was wondering if he could switch to once weekly injections such as Ozempic or Trulicity.  Review of Systems See HPI   Past Medical History:  Diagnosis Date   AI (aortic insufficiency) 01/11/2018   Trace, noted on ECHO   Arthritis    CAD in native artery    Nuclear stress test 10/19: EF 58, normal perfusion, low risk   Cellulitis    Dermatitis    Diabetes mellitus    type II   Diastolic dysfunction 25/85/2778   Mild, noted on ECHO   Diverticulosis 01/31/2016   ASCENDING COLON AND CECUM, NOTED ON COLONOSCOPY   DOE (dyspnea on exertion)    Edema    Fatty liver 09/18/2004   Noted on [place] ABD   History of kidney stones 05/23/2002   Noted on CT Abd   Hx of colonic polyps 01/31/2016    Hyperlipidemia    Hypertension    Internal hemorrhoids 01/31/2016   Noted on colonoscopy   Obesity    OSA (obstructive sleep apnea)    cpap   Positional vertigo    Pulmonary hypertension ([place]) 01/11/2018   Mild, noted on ECHO   Rhinosinusitis    Skin lesion    TR (tricuspid regurgitation) 01/11/2018   Trace, noted on ECHO    Social History   Socioeconomic History   Marital status: Married    Spouse name: Not on file   Number of children: Not on file   Years of education: Not on file   Highest education level: Not on file  Occupational History   Occupation: OWNER, Health and safety inspector: [employer]  Tobacco Use   Smoking status: Former Smoker    Packs/day: 1.00    Years: 20.00    Pack years: 20.00    Types: Cigarettes    Quit date: 01/10/1996    Years since quitting: 24.4   Smokeless tobacco: Never Used  Vaping Use   Vaping Use: Never used  Substance and Sexual Activity   Alcohol use: No   Drug use: No   Sexual activity: Not on file  Other Topics Concern   Not on file  Social History Narrative   Grew up in [place]. Mother was [place], Father [place].   He works daily - owns a [employer]    Married for 30+ years   Has a daughter who lives in [place]  Social Determinants of Health   Financial Resource Strain: High Risk   Difficulty of Paying Living Expenses: Hard  Food Insecurity:    Worried About Charity fundraiser in the Last Year: Not on file   [employer] of Food in the Last Year: Not on file  Transportation Needs: No Transportation Needs   Lack of Transportation (Medical): No   Lack of Transportation (Non-Medical): No  Physical Activity:    Days of Exercise per Week: Not on file   Minutes of Exercise per Session: Not on file  Stress:    Feeling of Stress : Not on file  Social Connections:    Frequency of Communication with Friends and Family: Not on file   Frequency of Social  Gatherings with Friends and Family: Not on file   Attends Religious Services: Not on file   Active Member of Clubs or Organizations: Not on file   Attends Archivist Meetings: Not on file   Marital Status: Not on file  Intimate Partner Violence:    Fear of Current or Ex-Partner: Not on file   Emotionally Abused: Not on file   Physically Abused: Not on file   Sexually Abused: Not on file    Past Surgical History:  Procedure Laterality Date   CARDIAC CATHETERIZATION     COLONOSCOPY     CORONARY STENT PLACEMENT     3 stents /1997   POLYPECTOMY     TONSILLECTOMY     TOTAL KNEE ARTHROPLASTY Right 10/04/2018   Procedure: RIGHT TOTAL KNEE ARTHROPLASTY;  Surgeon: Gaynelle Arabian, MD;  Location: [facility];  Service: Orthopedics;  Laterality: Right;  Adductor Block    Family History  Problem Relation Age of Onset   Cancer Mother        ? lung cancer   Heart disease Father    Heart attack Father    Heart disease Sister    Diabetes Sister    Heart attack Sister    Heart attack Brother     Allergies  Allergen Reactions   Lipitor [Atorvastatin Calcium] Other (See Comments)    Muscle soreness   Oxycodone Anxiety    Causes patient to become shaky and dizzy. Unable to tolerate.    Current Outpatient Medications on File Prior to Visit  Medication Sig Dispense Refill   albuterol (PROVENTIL HFA;VENTOLIN HFA) 108 (90 Base) MCG/ACT inhaler Inhale 2 puffs into the lungs every 6 (six) hours as needed for wheezing or shortness of breath. 1 Inhaler 0   fluticasone (FLONASE) 50 MCG/ACT nasal spray Place 2 sprays into both nostrils daily as needed. 16 mL 3   glipiZIDE (GLUCOTROL XL) 10 MG 24 hr tablet TAKE 1 TABLET (10 MG TOTAL) BY MOUTH DAILY WITH BREAKFAST. 90 tablet 0   Insulin Glargine (BASAGLAR KWIKPEN) 100 UNIT/ML INJECT 0.4 MLS (40 UNITS TOTAL) INTO THE SKIN AT BEDTIME. 15 mL 1   Insulin Pen Needle (BD PEN NEEDLE NANO U/F) 32G X 4 MM MISC USE AS DIRECTED  TWICE A DAY 200 each 0   ketoconazole (NIZORAL) 2 % cream Apply 1 application topically daily. Apply twice daily until rash has resolved 30 g 0   liraglutide (VICTOZA) 18 MG/3ML SOPN Inject 1.2 mg into the skin daily. 15 mL 1   meclizine (ANTIVERT) 12.5 MG tablet Take 1 tablet (12.5 mg total) by mouth 3 (three) times daily as needed for dizziness. 30 tablet 1   omeprazole (PRILOSEC) 20 MG capsule TAKE 1 CAPSULE BY MOUTH EVERY DAY 90  capsule 0   oxybutynin (DITROPAN-XL) 10 MG 24 hr tablet Take 10 mg by mouth daily.     simvastatin (ZOCOR) 20 MG tablet TAKE 1 TABLET BY MOUTH EVERYDAY AT BEDTIME 90 tablet 1   tamsulosin (FLOMAX) 0.4 MG CAPS capsule Take 1 capsule by mouth 2 (two) times daily.     TOVIAZ 8 MG TB24 tablet Take 8 mg by mouth daily.      No current facility-administered medications on file prior to visit.    BP 117/78 (BP Location: Right Arm, Patient Position: Sitting, Cuff Size: Large)    Pulse 65    Temp 97.8 F (36.6 C) (Oral)    Resp 13    Ht 5\' 11"  (1.803 m)    Wt 266 lb 9.6 oz (120.9 kg)    SpO2 93%    BMI 37.18 kg/m       Objective:   Physical Exam Vitals and nursing note reviewed.  Constitutional:      Appearance: Normal appearance. He is obese.  Cardiovascular:     Rate and Rhythm: Normal rate and regular rhythm.     Pulses: Normal pulses.     Heart sounds: Normal heart sounds.  Pulmonary:     Effort: Pulmonary effort is normal.     Breath sounds: Normal breath sounds.  Musculoskeletal:        General: Normal range of motion.  Skin:    General: Skin is warm and dry.  Neurological:     General: No focal deficit present.     Mental Status: He is alert and oriented to person, place, and time.        Assessment & Plan:  1. Uncontrolled type 2 diabetes mellitus with hyperglycemia (HCC) -Check A1c and BMP.  Will likely start him on Trulicity as I think it would be easier for him to dose.  Will call tomorrow once labs have resulted and we can discuss  further - Hemoglobin A1c; Future - Basic Metabolic Panel; Future - Basic Metabolic Panel - Hemoglobin A1c  2. Influenza vaccine needed  - Flu Vaccine QUAD High Dose(Fluad)  [name], NP

## 2020-05-31 ENCOUNTER — Other Ambulatory Visit: Payer: Self-pay | Admitting: Adult Health

## 2020-05-31 LAB — BASIC METABOLIC PANEL
BUN: 14 mg/dL (ref 7–25)
CO2: 27 mmol/L (ref 20–32)
Calcium: 9.3 mg/dL (ref 8.6–10.3)
Chloride: 107 mmol/L (ref 98–110)
Creat: 0.87 mg/dL (ref 0.70–1.18)
Glucose, Bld: 140 mg/dL — ABNORMAL HIGH (ref 65–99)
Potassium: 4.2 mmol/L (ref 3.5–5.3)
Sodium: 141 mmol/L (ref 135–146)

## 2020-05-31 LAB — HEMOGLOBIN A1C
Hgb A1c MFr Bld: 6.3 % of total Hgb — ABNORMAL HIGH (ref <5.7)
Mean Plasma Glucose: 134 (calc)
eAG (mmol/L): 7.4 (calc)

## 2020-06-01 ENCOUNTER — Telehealth: Payer: Self-pay

## 2020-06-01 NOTE — Progress Notes (Signed)
    Chronic Care Management Pharmacy Assistant   Name: Jacob Bishop  MRN: [id number] DOB: September 03, 1940  Reason for Encounter: Medication Review/Patient assistance for Trulicity.   PCP : [name], NP  Allergies:   Allergies  Allergen Reactions  . Lipitor [Atorvastatin Calcium] Other (See Comments)    Muscle soreness  . Oxycodone Anxiety    Causes patient to become shaky and dizzy. Unable to tolerate.    Medications: Outpatient Encounter Medications as of 06/01/2020  Medication Sig  . albuterol (PROVENTIL HFA;VENTOLIN HFA) 108 (90 Base) MCG/ACT inhaler Inhale 2 puffs into the lungs every 6 (six) hours as needed for wheezing or shortness of breath.  . fluticasone (FLONASE) 50 MCG/ACT nasal spray Place 2 sprays into both nostrils daily as needed.  Marland Kitchen glipiZIDE (GLUCOTROL XL) 10 MG 24 hr tablet TAKE 1 TABLET (10 MG TOTAL) BY MOUTH DAILY WITH BREAKFAST.  Marland Kitchen Insulin Glargine (BASAGLAR KWIKPEN) 100 UNIT/ML INJECT 0.4 MLS (40 UNITS TOTAL) INTO THE SKIN AT BEDTIME.  . Insulin Pen Needle (BD PEN NEEDLE NANO U/F) 32G X 4 MM MISC USE AS DIRECTED TWICE A DAY  . ketoconazole (NIZORAL) 2 % cream Apply 1 application topically daily. Apply twice daily until rash has resolved  . liraglutide (VICTOZA) 18 MG/3ML SOPN Inject 1.2 mg into the skin daily.  . meclizine (ANTIVERT) 12.5 MG tablet Take 1 tablet (12.5 mg total) by mouth 3 (three) times daily as needed for dizziness.  Marland Kitchen omeprazole (PRILOSEC) 20 MG capsule TAKE 1 CAPSULE BY MOUTH EVERY DAY  . oxybutynin (DITROPAN-XL) 10 MG 24 hr tablet Take 10 mg by mouth daily.  . simvastatin (ZOCOR) 20 MG tablet TAKE 1 TABLET BY MOUTH EVERYDAY AT BEDTIME  . tamsulosin (FLOMAX) 0.4 MG CAPS capsule Take 1 capsule by mouth 2 (two) times daily.  . TOVIAZ 8 MG TB24 tablet Take 8 mg by mouth daily.    No facility-administered encounter medications on file as of 06/01/2020.    Current Diagnosis: Patient Active Problem List   Diagnosis Date Noted  . OA  (osteoarthritis) of knee 10/04/2018  . Other secondary pulmonary hypertension ([place]) 04/06/2018  . Osteoarthritis, hand 05/05/2014  . Carpal tunnel syndrome 05/02/2014  . Eczema 01/23/2014  . Thrombocytopenia, unspecified ([place]) 11/19/2012  . Overactive bladder 08/24/2012  . Osteoarthritis of right knee 01/22/2012  . Benign positional vertigo 07/09/2011  . CAD, NATIVE VESSEL 10/22/2009  . EDEMA 08/03/2009  . Obstructive sleep apnea 05/03/2009  . SKIN LESION 09/23/2007  . Diabetes mellitus type 2, uncontrolled ([place]) 03/23/2007  . Hyperlipidemia 03/23/2007  . Essential hypertension 03/23/2007  . COLONIC POLYPS, HX OF 03/23/2007  . Morbid obesity ([place]) 03/23/2007     Follow-Up:  Patient Assistance Coordination   Spoke to patient to inform him that we are sending her a Patient assistance form  for Trulicity by mail.Informed patient to include a copy of his proof of income AND a copy of his Explanation of Benefits (EOB) statement from his insurance.Advised patient to return the PAP forms back to the [facility].  [place] Pharmacist Assistant [phone]

## 2020-06-05 ENCOUNTER — Telehealth: Payer: Self-pay

## 2020-06-05 NOTE — Progress Notes (Signed)
Chronic Care Management Pharmacy Assistant   Name: Jacob Bishop  MRN: [id number] DOB: 1941/04/04  Reason for Encounter: Medication Review   PCP : [name], NP  Allergies:   Allergies  Allergen Reactions  . Lipitor [Atorvastatin Calcium] Other (See Comments)    Muscle soreness  . Oxycodone Anxiety    Causes patient to become shaky and dizzy. Unable to tolerate.    Medications: Outpatient Encounter Medications as of 06/05/2020  Medication Sig  . albuterol (PROVENTIL HFA;VENTOLIN HFA) 108 (90 Base) MCG/ACT inhaler Inhale 2 puffs into the lungs every 6 (six) hours as needed for wheezing or shortness of breath.  . fluticasone (FLONASE) 50 MCG/ACT nasal spray Place 2 sprays into both nostrils daily as needed.  Marland Kitchen glipiZIDE (GLUCOTROL XL) 10 MG 24 hr tablet TAKE 1 TABLET (10 MG TOTAL) BY MOUTH DAILY WITH BREAKFAST.  Marland Kitchen Insulin Glargine (BASAGLAR KWIKPEN) 100 UNIT/ML INJECT 0.4 MLS (40 UNITS TOTAL) INTO THE SKIN AT BEDTIME.  . Insulin Pen Needle (BD PEN NEEDLE NANO U/F) 32G X 4 MM MISC USE AS DIRECTED TWICE A DAY  . ketoconazole (NIZORAL) 2 % cream Apply 1 application topically daily. Apply twice daily until rash has resolved  . liraglutide (VICTOZA) 18 MG/3ML SOPN Inject 1.2 mg into the skin daily.  . meclizine (ANTIVERT) 12.5 MG tablet Take 1 tablet (12.5 mg total) by mouth 3 (three) times daily as needed for dizziness.  Marland Kitchen omeprazole (PRILOSEC) 20 MG capsule TAKE 1 CAPSULE BY MOUTH EVERY DAY  . oxybutynin (DITROPAN-XL) 10 MG 24 hr tablet Take 10 mg by mouth daily.  . simvastatin (ZOCOR) 20 MG tablet TAKE 1 TABLET BY MOUTH EVERYDAY AT BEDTIME  . tamsulosin (FLOMAX) 0.4 MG CAPS capsule Take 1 capsule by mouth 2 (two) times daily.  . TOVIAZ 8 MG TB24 tablet Take 8 mg by mouth daily.    No facility-administered encounter medications on file as of 06/05/2020.    Current Diagnosis: Patient Active Problem List   Diagnosis Date Noted  . OA (osteoarthritis) of knee 10/04/2018  .  Other secondary pulmonary hypertension ([place]) 04/06/2018  . Osteoarthritis, hand 05/05/2014  . Carpal tunnel syndrome 05/02/2014  . Eczema 01/23/2014  . Thrombocytopenia, unspecified ([place]) 11/19/2012  . Overactive bladder 08/24/2012  . Osteoarthritis of right knee 01/22/2012  . Benign positional vertigo 07/09/2011  . CAD, NATIVE VESSEL 10/22/2009  . EDEMA 08/03/2009  . Obstructive sleep apnea 05/03/2009  . SKIN LESION 09/23/2007  . Diabetes mellitus type 2, uncontrolled ([place]) 03/23/2007  . Hyperlipidemia 03/23/2007  . Essential hypertension 03/23/2007  . COLONIC POLYPS, HX OF 03/23/2007  . Morbid obesity ([place]) 03/23/2007      Follow-Up:  Pharmacist Review   Reviewed chart for medication changes ahead of medication coordination call.  OVs, Consults, or hospital visits since last care coordination call/Pharmacist visit.  05/30/2020 PCP Nafziger Tommi Rumps No medication changes indicated   BP Readings from Last 3 Encounters:  05/30/20 117/78  10/21/19 126/70  10/04/19 108/60    Lab Results  Component Value Date   HGBA1C 6.3 (H) 05/30/2020     Patient obtains medications through Adherence Packaging  30 Days   Last adherence delivery included: (medication name and frequency)  None ID Patient declined (meds) last month due to PRN use/additional supply on hand.  None ID  Patient is due for next adherence delivery on: No fill needed. Called patient and reviewed medications and coordinated delivery.  This delivery to include:  No fill needed  Patient declined the  following medications (meds) due to (reason)  Albuterol -PRN (adequate supply)  Flonase 50 MCG/ACT -(adequate supply)  Basaglar 100 unit Daily -(adequate supply)  Oxybutynin CL ER 10 MG Tablet Daily Breakfast- (adequate supply)  Simvastatin 20 MG Tablet Daily bedtime -(adequate supply)  Tamsulosin HCL 0.4 MG Capsule Twice Daily Breakfast, Bedtime - (adequate supply)  Meclizine 12.5 MG Tablet PRN -(adequate  supply)  Victoza 18 MG/3ML Injections (adequate supply)  Trulicity - patient states he has not started Trulicity because he wants to finish his Victoza first.   Patient needs refills for None ID.  Confirmed delivery date of no fill needed, advised patient that pharmacy will contact them the morning of delivery.  [place] Pharmacist Assistant [phone]

## 2020-06-06 ENCOUNTER — Other Ambulatory Visit: Payer: Self-pay | Admitting: Adult Health

## 2020-06-19 ENCOUNTER — Telehealth: Payer: Self-pay

## 2020-06-19 NOTE — Progress Notes (Signed)
  I have attempted without success to contact this patient by phone three times to do his Diabetes Disease State call. I left a Voice message for patient to return my call.  [place] Pharmacist Assistant [phone]

## 2020-06-26 ENCOUNTER — Ambulatory Visit: Payer: Medicare Other | Admitting: Pulmonary Disease

## 2020-07-06 ENCOUNTER — Telehealth: Payer: Self-pay

## 2020-07-06 NOTE — Progress Notes (Signed)
  Please void,open in error.  [place] Pharmacist Assistant [phone]

## 2020-07-16 ENCOUNTER — Telehealth: Payer: Self-pay | Admitting: Pharmacist

## 2020-07-16 NOTE — Chronic Care Management (AMB) (Signed)
Chronic Care Management Pharmacy Assistant   Name: DARCY CORDNER  MRN: [id number] DOB: 28-Jan-1941  Reason for Encounter: Medication Review  PCP : [name], NP  Allergies:   Allergies  Allergen Reactions   Lipitor [Atorvastatin Calcium] Other (See Comments)    Muscle soreness   Oxycodone Anxiety    Causes patient to become shaky and dizzy. Unable to tolerate.    Medications: Outpatient Encounter Medications as of 07/16/2020  Medication Sig   albuterol (PROVENTIL HFA;VENTOLIN HFA) 108 (90 Base) MCG/ACT inhaler Inhale 2 puffs into the lungs every 6 (six) hours as needed for wheezing or shortness of breath.   fluticasone (FLONASE) 50 MCG/ACT nasal spray Place 2 sprays into both nostrils daily as needed.   glipiZIDE (GLUCOTROL XL) 10 MG 24 hr tablet TAKE 1 TABLET (10 MG TOTAL) BY MOUTH DAILY WITH BREAKFAST.   Insulin Glargine (BASAGLAR KWIKPEN) 100 UNIT/ML INJECT 0.4 MLS (40 UNITS TOTAL) INTO THE SKIN AT BEDTIME.   Insulin Pen Needle (BD PEN NEEDLE NANO U/F) 32G X 4 MM MISC USE AS DIRECTED TWICE A DAY   ketoconazole (NIZORAL) 2 % cream Apply 1 application topically daily. Apply twice daily until rash has resolved   liraglutide (VICTOZA) 18 MG/3ML SOPN Inject 1.2 mg into the skin daily.   meclizine (ANTIVERT) 12.5 MG tablet Take 1 tablet (12.5 mg total) by mouth 3 (three) times daily as needed for dizziness.   omeprazole (PRILOSEC) 20 MG capsule TAKE 1 CAPSULE BY MOUTH EVERY DAY   oxybutynin (DITROPAN-XL) 10 MG 24 hr tablet Take 10 mg by mouth daily.   simvastatin (ZOCOR) 20 MG tablet TAKE 1 TABLET BY MOUTH EVERYDAY AT BEDTIME   tamsulosin (FLOMAX) 0.4 MG CAPS capsule Take 1 capsule by mouth 2 (two) times daily.   TOVIAZ 8 MG TB24 tablet Take 8 mg by mouth daily.    No facility-administered encounter medications on file as of 07/16/2020.    Current Diagnosis: Patient Active Problem List   Diagnosis Date Noted   OA (osteoarthritis) of knee 10/04/2018    Other secondary pulmonary hypertension ([place]) 04/06/2018   Osteoarthritis, hand 05/05/2014   Carpal tunnel syndrome 05/02/2014   Eczema 01/23/2014   Thrombocytopenia, unspecified ([place]) 11/19/2012   Overactive bladder 08/24/2012   Osteoarthritis of right knee 01/22/2012   Benign positional vertigo 07/09/2011   CAD, NATIVE VESSEL 10/22/2009   EDEMA 08/03/2009   Obstructive sleep apnea 05/03/2009   SKIN LESION 09/23/2007   Diabetes mellitus type 2, uncontrolled ([place]) 03/23/2007   Hyperlipidemia 03/23/2007   Essential hypertension 03/23/2007   COLONIC POLYPS, HX OF 03/23/2007   Morbid obesity ([place]) 03/23/2007    Goals Addressed   None     Reviewed chart for medication changes ahead of medication coordination call. No OVs, Consults, or hospital visits since last care coordination call/Pharmacist visit.  No medication changes indicated.  BP Readings from Last 3 Encounters:  05/30/20 117/78  10/21/19 126/70  10/04/19 108/60    Lab Results  Component Value Date   HGBA1C 6.3 (H) 05/30/2020     Patient obtains medications through Adherence Packaging  30 Days   Last adherence delivery included:   Oxybutynin (DITROPAN-XL) 10 MG 24 hr: one tablet at breakfast  Meclizine (ANTIVERT) 12.5 mg: take one tablet by mouth 3 three times daily as needed for dizziness.  Liraglutide (VICTOZA) 18 MG/3ML SOPN: Inject 1.2 mg into skin daily              I spoke with the patient  and review medications. There are no changes in medications currently. Patient declined these last month due to PRN use/additional supply on hand. The patient is taking the following medications:  Oxybutynin (DITROPAN-XL) 10 MG 24 hr: one tablet at breakfast  Meclizine (ANTIVERT) 12.5 mg: take one tablet by mouth 3 three times daily as needed for dizziness.  Liraglutide (VICTOZA) 18 MG/3ML SOPN: Inject 1.2 mg into skin daily  Insulin Glargine (BASAGLAR KWIKPEN) 100 UNIT/ML: Twice a day  Tamsulosin  (FLOMAX) 0.4 mg: one capsule at breakfast and bedtime  Simvastatin (ZOCOR) 20 mg: one tablet at bedtime  Fluticasone (FLONASE) 50 MCG/ACT nasal spray  Albuterol (PROVENTIL HFA; VENTOLIN HFA) 108 (90 Base) MCG/ACT inhaler: Inhale 2 puffs every six hours as needed   Patient is due for next delivery date of 08-31-2020 , advised patient that pharmacy will contact them the morning of delivery.He currently does not need refills.  Follow-Up:  Coordination of Enhanced Pharmacy Services and Pharmacist Review   Maia Breslow, [place] Assistant [phone]

## 2020-08-09 ENCOUNTER — Telehealth: Payer: Self-pay | Admitting: Pharmacist

## 2020-08-15 NOTE — Chronic Care Management (AMB) (Signed)
    Chronic Care Management Pharmacy Assistant   Name: Jacob Bishop  MRN: [id number] DOB: 07/24/1941  Reason for Encounter: Medication Review  PCP : [name], NP  Allergies:   Allergies  Allergen Reactions  . Lipitor [Atorvastatin Calcium] Other (See Comments)    Muscle soreness  . Oxycodone Anxiety    Causes patient to become shaky and dizzy. Unable to tolerate.    Medications: Outpatient Encounter Medications as of 08/09/2020  Medication Sig  . albuterol (PROVENTIL HFA;VENTOLIN HFA) 108 (90 Base) MCG/ACT inhaler Inhale 2 puffs into the lungs every 6 (six) hours as needed for wheezing or shortness of breath.  . fluticasone (FLONASE) 50 MCG/ACT nasal spray Place 2 sprays into both nostrils daily as needed.  Marland Kitchen glipiZIDE (GLUCOTROL XL) 10 MG 24 hr tablet TAKE 1 TABLET (10 MG TOTAL) BY MOUTH DAILY WITH BREAKFAST.  Marland Kitchen Insulin Glargine (BASAGLAR KWIKPEN) 100 UNIT/ML INJECT 0.4 MLS (40 UNITS TOTAL) INTO THE SKIN AT BEDTIME.  . Insulin Pen Needle (BD PEN NEEDLE NANO U/F) 32G X 4 MM MISC USE AS DIRECTED TWICE A DAY  . ketoconazole (NIZORAL) 2 % cream Apply 1 application topically daily. Apply twice daily until rash has resolved  . liraglutide (VICTOZA) 18 MG/3ML SOPN Inject 1.2 mg into the skin daily.  . meclizine (ANTIVERT) 12.5 MG tablet Take 1 tablet (12.5 mg total) by mouth 3 (three) times daily as needed for dizziness.  Marland Kitchen omeprazole (PRILOSEC) 20 MG capsule TAKE 1 CAPSULE BY MOUTH EVERY DAY  . oxybutynin (DITROPAN-XL) 10 MG 24 hr tablet Take 10 mg by mouth daily.  . simvastatin (ZOCOR) 20 MG tablet TAKE 1 TABLET BY MOUTH EVERYDAY AT BEDTIME  . tamsulosin (FLOMAX) 0.4 MG CAPS capsule Take 1 capsule by mouth 2 (two) times daily.  . TOVIAZ 8 MG TB24 tablet Take 8 mg by mouth daily.    No facility-administered encounter medications on file as of 08/09/2020.    Current Diagnosis: Patient Active Problem List   Diagnosis Date Noted  . OA (osteoarthritis) of knee 10/04/2018  .  Other secondary pulmonary hypertension ([place]) 04/06/2018  . Osteoarthritis, hand 05/05/2014  . Carpal tunnel syndrome 05/02/2014  . Eczema 01/23/2014  . Thrombocytopenia, unspecified ([place]) 11/19/2012  . Overactive bladder 08/24/2012  . Osteoarthritis of right knee 01/22/2012  . Benign positional vertigo 07/09/2011  . CAD, NATIVE VESSEL 10/22/2009  . EDEMA 08/03/2009  . Obstructive sleep apnea 05/03/2009  . SKIN LESION 09/23/2007  . Diabetes mellitus type 2, uncontrolled ([place]) 03/23/2007  . Hyperlipidemia 03/23/2007  . Essential hypertension 03/23/2007  . COLONIC POLYPS, HX OF 03/23/2007  . Morbid obesity ([place]) 03/23/2007    Goals Addressed   None     Follow-Up:  Pharmacist Review   08-09-20 I left patient a message to please return call.  08-13-20 I spoke with patient about his medication and he was driving. He stated that he did not believe he needed any medications currently but he would check when he got home and called back.  08-15-2020- Left patient to call back. No return call from patient.  Maia Breslow, [place] Assistant [phone]

## 2020-08-29 ENCOUNTER — Other Ambulatory Visit: Payer: Self-pay

## 2020-08-29 ENCOUNTER — Ambulatory Visit: Payer: Medicare Other

## 2020-08-29 NOTE — Progress Notes (Unsigned)
Erroneous Encounter

## 2020-09-17 ENCOUNTER — Telehealth: Payer: Self-pay | Admitting: Pharmacist

## 2020-09-17 NOTE — Chronic Care Management (AMB) (Signed)
Chronic Care Management Pharmacy Assistant   Name: Jacob Bishop  MRN: [id number] DOB: June 24, 1941  Reason for Encounter: Medication Review  PCP : [name], NP  Allergies:   Allergies  Allergen Reactions  . Lipitor [Atorvastatin Calcium] Other (See Comments)    Muscle soreness  . Oxycodone Anxiety    Causes patient to become shaky and dizzy. Unable to tolerate.    Medications: Outpatient Encounter Medications as of 09/17/2020  Medication Sig  . albuterol (PROVENTIL HFA;VENTOLIN HFA) 108 (90 Base) MCG/ACT inhaler Inhale 2 puffs into the lungs every 6 (six) hours as needed for wheezing or shortness of breath.  . fluticasone (FLONASE) 50 MCG/ACT nasal spray Place 2 sprays into both nostrils daily as needed.  Marland Kitchen glipiZIDE (GLUCOTROL XL) 10 MG 24 hr tablet TAKE 1 TABLET (10 MG TOTAL) BY MOUTH DAILY WITH BREAKFAST.  Marland Kitchen Insulin Glargine (BASAGLAR KWIKPEN) 100 UNIT/ML INJECT 0.4 MLS (40 UNITS TOTAL) INTO THE SKIN AT BEDTIME.  . Insulin Pen Needle (BD PEN NEEDLE NANO U/F) 32G X 4 MM MISC USE AS DIRECTED TWICE A DAY  . ketoconazole (NIZORAL) 2 % cream Apply 1 application topically daily. Apply twice daily until rash has resolved  . liraglutide (VICTOZA) 18 MG/3ML SOPN Inject 1.2 mg into the skin daily.  . meclizine (ANTIVERT) 12.5 MG tablet Take 1 tablet (12.5 mg total) by mouth 3 (three) times daily as needed for dizziness.  Marland Kitchen omeprazole (PRILOSEC) 20 MG capsule TAKE 1 CAPSULE BY MOUTH EVERY DAY  . oxybutynin (DITROPAN-XL) 10 MG 24 hr tablet Take 10 mg by mouth daily.  . simvastatin (ZOCOR) 20 MG tablet TAKE 1 TABLET BY MOUTH EVERYDAY AT BEDTIME  . tamsulosin (FLOMAX) 0.4 MG CAPS capsule Take 1 capsule by mouth 2 (two) times daily.  . TOVIAZ 8 MG TB24 tablet Take 8 mg by mouth daily.    No facility-administered encounter medications on file as of 09/17/2020.    Current Diagnosis: Patient Active Problem List   Diagnosis Date Noted  . OA (osteoarthritis) of knee 10/04/2018  . Other  secondary pulmonary hypertension ([place]) 04/06/2018  . Osteoarthritis, hand 05/05/2014  . Carpal tunnel syndrome 05/02/2014  . Eczema 01/23/2014  . Thrombocytopenia, unspecified ([place]) 11/19/2012  . Overactive bladder 08/24/2012  . Osteoarthritis of right knee 01/22/2012  . Benign positional vertigo 07/09/2011  . CAD, NATIVE VESSEL 10/22/2009  . EDEMA 08/03/2009  . Obstructive sleep apnea 05/03/2009  . SKIN LESION 09/23/2007  . Diabetes mellitus type 2, uncontrolled ([place]) 03/23/2007  . Hyperlipidemia 03/23/2007  . Essential hypertension 03/23/2007  . COLONIC POLYPS, HX OF 03/23/2007  . Morbid obesity ([place]) 03/23/2007    Goals Addressed   None    Reviewed chart for medication changes ahead of medication coordination call. No OVs, Consults, or hospital visits since last care coordination call/Pharmacist visit. No medication changes indicated.  BP Readings from Last 3 Encounters:  05/30/20 117/78  10/21/19 126/70  10/04/19 108/60    Lab Results  Component Value Date   HGBA1C 6.3 (H) 05/30/2020     Patient obtains medications through Adherence Packaging  30 Days   Last adherence delivery included:  Marland Kitchen Insulin Glargine (BASAGLAR KWIKPEN) 100 UNIT/ML: inject 0.4 mls into skin at bedtime  Patient is due for next adherence delivery on: 09-26-2020. Called patient and reviewed medications and coordinated delivery.  This delivery to include: . Insulin Glargine (BASAGLAR KWIKPEN) 100 UNIT/ML: inject 0.4 mls into skin at bedtime . Meclizine (ANTIVERT) 12.5 mg tablet: one tablet (3) time  a day as needed . Simvastatin (ZOCOR) 20 mg: at bedtime Patient needs refills for: o Simvastatin (ZOCOR) 20 MG tablet   Confirmed delivery date of 09-26-2020,  advised patient that pharmacy will contact them the morning of delivery. Follow-Up:  Care Coordination with Outside Provider and Pharmacist Review  Maia Breslow, [place] Assistant [phone]

## 2020-09-25 ENCOUNTER — Other Ambulatory Visit: Payer: Self-pay | Admitting: Adult Health

## 2020-09-25 DIAGNOSIS — H8113 Benign paroxysmal vertigo, bilateral: Secondary | ICD-10-CM

## 2020-09-26 ENCOUNTER — Other Ambulatory Visit: Payer: Self-pay | Admitting: Adult Health

## 2020-09-26 MED ORDER — BASAGLAR KWIKPEN 100 UNIT/ML ~~LOC~~ SOPN: PEN_INJECTOR | SUBCUTANEOUS | 1 refills | Status: DC

## 2020-09-26 NOTE — Telephone Encounter (Signed)
Sent to the pharmacy by e-scribe. 

## 2020-10-08 ENCOUNTER — Telehealth: Payer: Self-pay | Admitting: Pharmacist

## 2020-10-08 NOTE — Chronic Care Management (AMB) (Addendum)
Chronic Care Management Pharmacy Assistant   Name: Jacob Bishop  MRN: [id number] DOB: [birth year]  Reason for Encounter: Medication Review  PCP : [name], NP  Allergies:   Allergies  Allergen Reactions   Lipitor [Atorvastatin Calcium] Other (See Comments)    Muscle soreness   Oxycodone Anxiety    Causes patient to become shaky and dizzy. Unable to tolerate.    Medications: Outpatient Encounter Medications as of 10/08/2020  Medication Sig   albuterol (PROVENTIL HFA;VENTOLIN HFA) 108 (90 Base) MCG/ACT inhaler Inhale 2 puffs into the lungs every 6 (six) hours as needed for wheezing or shortness of breath.   fluticasone (FLONASE) 50 MCG/ACT nasal spray Place 2 sprays into both nostrils daily as needed.   glipiZIDE (GLUCOTROL XL) 10 MG 24 hr tablet TAKE 1 TABLET (10 MG TOTAL) BY MOUTH DAILY WITH BREAKFAST.   Insulin Glargine (BASAGLAR KWIKPEN) 100 UNIT/ML INJECT 0.4 MLS (40 UNITS TOTAL) INTO THE SKIN AT BEDTIME.   Insulin Pen Needle (BD PEN NEEDLE NANO U/F) 32G X 4 MM MISC USE AS DIRECTED TWICE A DAY   ketoconazole (NIZORAL) 2 % cream Apply 1 application topically daily. Apply twice daily until rash has resolved   liraglutide (VICTOZA) 18 MG/3ML SOPN Inject 1.2 mg into the skin daily.   meclizine (ANTIVERT) 12.5 MG tablet TAKE ONE TABLET BY MOUTH THREE TIMES DAILY AS NEEDED FOR dizziness   omeprazole (PRILOSEC) 20 MG capsule TAKE 1 CAPSULE BY MOUTH EVERY DAY   oxybutynin (DITROPAN-XL) 10 MG 24 hr tablet Take 10 mg by mouth daily.   simvastatin (ZOCOR) 20 MG tablet TAKE 1 TABLET BY MOUTH EVERYDAY AT BEDTIME   tamsulosin (FLOMAX) 0.4 MG CAPS capsule Take 1 capsule by mouth 2 (two) times daily.   TOVIAZ 8 MG TB24 tablet Take 8 mg by mouth daily.    No facility-administered encounter medications on file as of 10/08/2020.    Current Diagnosis: Patient Active Problem List   Diagnosis Date Noted   OA (osteoarthritis) of knee 10/04/2018   Other secondary pulmonary hypertension  ([place]) 04/06/2018   Osteoarthritis, hand 05/05/2014   Carpal tunnel syndrome 05/02/2014   Eczema 01/23/2014   Thrombocytopenia, unspecified ([place]) 11/19/2012   Overactive bladder 08/24/2012   Osteoarthritis of right knee 01/22/2012   Benign positional vertigo 07/09/2011   CAD, NATIVE VESSEL 10/22/2009   EDEMA 08/03/2009   Obstructive sleep apnea 05/03/2009   SKIN LESION 09/23/2007   Diabetes mellitus type 2, uncontrolled ([place]) 03/23/2007   Hyperlipidemia 03/23/2007   Essential hypertension 03/23/2007   COLONIC POLYPS, HX OF 03/23/2007   Morbid obesity ([place]) 03/23/2007    Goals Addressed   None    Reviewed chart for medication changes ahead of medication coordination call. No OVs, Consults, or hospital visits since last care coordination call/Pharmacist visit.  No medication changes indicated.  BP Readings from Last 3 Encounters:  05/30/20 117/78  10/21/19 126/70  10/04/19 108/60    Lab Results  Component Value Date   HGBA1C 6.3 (H) 05/30/2020     Patient obtains medications through Adherence Packaging  30 Days  Last adherence delivery included:  Insulin Glargine (BASAGLAR KWIKPEN) 100 UNIT/ML: inject 0.4 mls into skin at bedtime Meclizine (ANTIVERT) 12.5 mg tablet: one tablet (3) time a day as needed Simvastatin (ZOCOR) 20 mg: at bedtime  I spoke with the patient and review medications. There are no changes in medications currently. Patient declined these medication this month due to PRN use/additional supply on hand.The patient is taking the  following medications: Insulin Glargine (BASAGLAR KWIKPEN) 100 UNIT/ML: inject 0.4 mls into skin at bedtime Meclizine (ANTIVERT) 12.5 mg tablet: one tablet (3) time a day as needed Simvastatin (ZOCOR) 20 mg: at bedtime  He currently does not need refills. Confirmed delivery date of 10/26/2020, advised patient that pharmacy will contact them the morning of delivery.   Follow-Up:  Coordination of Enhanced Pharmacy Services and  Pharmacist Review   Maia Breslow, [place] Assistant [phone]

## 2020-10-29 ENCOUNTER — Telehealth: Payer: Self-pay | Admitting: Pharmacist

## 2020-10-29 NOTE — Chronic Care Management (AMB) (Signed)
° ° °  Chronic Care Management Pharmacy Assistant   Name: Jacob Bishop  MRN: [id number] DOB: [birth year]  Reason for Encounter: Medication Review   10/29/2020- Patient called [facility] needing refills of medications. Called patient, we reviewed medications and coordinated delivery for 10/29/2020 on the following medications: Simvastatin 20 mg- 1 tablet daily at bedtime.  Oxybuytnin 10 mg- 1 tablet daily Meclizine 12.5 mg- 1 tablet three times a day as needed for dizziness Tamsulosin 0.4 mg- 1 capsule by mouth twice daily Flonase NS- 2 sprays each nostril daily as needed Victoza- Inject 1.2 mg into skin daily   Jeni Salles, CPP notified.  Medications: Outpatient Encounter Medications as of 10/29/2020  Medication Sig   albuterol (PROVENTIL HFA;VENTOLIN HFA) 108 (90 Base) MCG/ACT inhaler Inhale 2 puffs into the lungs every 6 (six) hours as needed for wheezing or shortness of breath.   fluticasone (FLONASE) 50 MCG/ACT nasal spray Place 2 sprays into both nostrils daily as needed.   glipiZIDE (GLUCOTROL XL) 10 MG 24 hr tablet TAKE 1 TABLET (10 MG TOTAL) BY MOUTH DAILY WITH BREAKFAST.   Insulin Glargine (BASAGLAR KWIKPEN) 100 UNIT/ML INJECT 0.4 MLS (40 UNITS TOTAL) INTO THE SKIN AT BEDTIME.   Insulin Pen Needle (BD PEN NEEDLE NANO U/F) 32G X 4 MM MISC USE AS DIRECTED TWICE A DAY   ketoconazole (NIZORAL) 2 % cream Apply 1 application topically daily. Apply twice daily until rash has resolved   liraglutide (VICTOZA) 18 MG/3ML SOPN Inject 1.2 mg into the skin daily.   meclizine (ANTIVERT) 12.5 MG tablet TAKE ONE TABLET BY MOUTH THREE TIMES DAILY AS NEEDED FOR dizziness   omeprazole (PRILOSEC) 20 MG capsule TAKE 1 CAPSULE BY MOUTH EVERY DAY   oxybutynin (DITROPAN-XL) 10 MG 24 hr tablet Take 10 mg by mouth daily.   simvastatin (ZOCOR) 20 MG tablet TAKE 1 TABLET BY MOUTH EVERYDAY AT BEDTIME   tamsulosin (FLOMAX) 0.4 MG CAPS capsule Take 1 capsule by mouth 2 (two) times daily.    TOVIAZ 8 MG TB24 tablet Take 8 mg by mouth daily.    No facility-administered encounter medications on file as of 10/29/2020.    Star Rating Drugs: Simvastatin 20 mg- last filled 09/25/2020 30 day supply [facility].   SIG: [name], [name] Pharmacist Assistant [phone]

## 2020-10-30 ENCOUNTER — Encounter: Payer: Self-pay | Admitting: Adult Health

## 2020-10-30 ENCOUNTER — Other Ambulatory Visit: Payer: Self-pay

## 2020-10-30 ENCOUNTER — Ambulatory Visit (INDEPENDENT_AMBULATORY_CARE_PROVIDER_SITE_OTHER): Payer: Medicare Other | Admitting: Adult Health

## 2020-10-30 DIAGNOSIS — J0140 Acute pansinusitis, unspecified: Secondary | ICD-10-CM

## 2020-10-30 MED ORDER — DOXYCYCLINE HYCLATE 100 MG PO CAPS: 100.0000 mg | ORAL_CAPSULE | Freq: Two times a day (BID) | ORAL | 0 refills | Status: DC

## 2020-10-30 NOTE — Progress Notes (Signed)
Virtual Visit via Telephone Note  I connected with Jacob Bishop on 10/30/20 at  4:30 PM EST by telephone and verified that I am speaking with the correct person using two identifiers.   I discussed the limitations, risks, security and privacy concerns of performing an evaluation and management service by telephone and the availability of in person appointments. I also discussed with the patient that there may be a patient responsible charge related to this service. The patient expressed understanding and agreed to proceed.  Location patient: home Location provider: work or home office Participants present for the call: patient, provider Patient did not have a visit in the prior 7 days to address this/these issue(s).   History of Present Illness: 80 year old male who  has a past medical history of AI (aortic insufficiency) (01/11/2018), Arthritis, CAD in native artery, Cellulitis, Dermatitis, Diabetes mellitus, Diastolic dysfunction (18/29/9371), Diverticulosis (01/31/2016), DOE (dyspnea on exertion), Edema, Fatty liver (09/18/2004), History of kidney stones (05/23/2002), colonic polyps (01/31/2016), Hyperlipidemia, Hypertension, Internal hemorrhoids (01/31/2016), Obesity, OSA (obstructive sleep apnea), Positional vertigo, Pulmonary hypertension ([place]) (01/11/2018), Rhinosinusitis, Skin lesion, and TR (tricuspid regurgitation) (01/11/2018).  80 year old male who is being evaluated today for an acute issue.  Symptoms started roughly 10 days ago and have been progressively worse over the last 2 to 3 days.  Symptoms include sinus congestion, sinus pain and pressure, frontal headache, facial pain, rhinorrhea with discolored drainage and fatigue.  He is fully vaccinated against COVID-19.  Family members do not have similar symptoms.  He denies fevers or chills.  He uses Flonase on a daily basis without relief   Observations/Objective: Patient sounds cheerful and well on the phone. I do not appreciate any  SOB. Speech and thought processing are grossly intact. Patient reported vitals:  Assessment and Plan: 1. Acute non-recurrent pansinusitis -Treat due to symptoms and duration.  Advise follow-up if no improvement over the next 72 hours - doxycycline (VIBRAMYCIN) 100 MG capsule; Take 1 capsule (100 mg total) by mouth 2 (two) times daily.  Dispense: 14 capsule; Refill: 0   Follow Up Instructions:   I did not refer this patient for an OV in the next 24 hours for this/these issue(s).  I discussed the assessment and treatment plan with the patient. The patient was provided an opportunity to ask questions and all were answered. The patient agreed with the plan and demonstrated an understanding of the instructions.   The patient was advised to call back or seek an in-person evaluation if the symptoms worsen or if the condition fails to improve as anticipated.  I provided 15 minutes of non-face-to-face time during this encounter.   [name], NP

## 2020-11-14 ENCOUNTER — Ambulatory Visit (INDEPENDENT_AMBULATORY_CARE_PROVIDER_SITE_OTHER): Payer: Medicare Other | Admitting: Pharmacist

## 2020-11-14 DIAGNOSIS — E1165 Type 2 diabetes mellitus with hyperglycemia: Secondary | ICD-10-CM

## 2020-11-14 DIAGNOSIS — E785 Hyperlipidemia, unspecified: Secondary | ICD-10-CM | POA: Diagnosis not present

## 2020-11-14 DIAGNOSIS — I10 Essential (primary) hypertension: Secondary | ICD-10-CM

## 2020-11-14 DIAGNOSIS — N400 Enlarged prostate without lower urinary tract symptoms: Secondary | ICD-10-CM | POA: Diagnosis not present

## 2020-11-14 NOTE — Progress Notes (Signed)
Chronic Care Management Pharmacy Note  11/22/2020 Name:  Jacob Bishop MRN:  [id number] DOB:  18-Mar-1941  Subjective: Jacob Bishop is an 80 y.o. year old male who is a primary patient of Nafziger, Tommi Rumps, NP.  The CCM team was consulted for assistance with disease management and care coordination needs.    Engaged with patient by telephone for follow up visit in response to provider referral for pharmacy case management and/or care coordination services.   Consent to Services:  The patient was given information about Chronic Care Management services, agreed to services, and gave verbal consent prior to initiation of services.  Please see initial visit note for detailed documentation.   Patient Care Team: [name], NP as PCP - General (Family Medicine) Constance Haw, MD as PCP - Cardiology (Cardiology) Ceasar Mons, MD as Consulting Physician (Urology) Gaynelle Arabian, MD as Consulting Physician (Orthopedic Surgery) Viona Gilmore, [facility] as Pharmacist (Pharmacist)  Recent office visits: 10/30/20 [name], NP: Patient presented for nasal congestion. Prescribed doxycycline.   05/30/20 [name], NP: Patient presented for DM follow up. Plan to switch to Trulicity.   Recent consult visits: 11/08/19 Patient presented for dermatology visit.  Hospital visits: None in previous 6 months  Objective:  Lab Results  Component Value Date   CREATININE 0.87 05/30/2020   BUN 14 05/30/2020   GFR 77.99 10/04/2019   GFRNONAA >60 10/06/2018   GFRAA >60 10/06/2018   NA 141 05/30/2020   K 4.2 05/30/2020   CALCIUM 9.3 05/30/2020   CO2 27 05/30/2020   GLUCOSE 140 (H) 05/30/2020    Lab Results  Component Value Date/Time   HGBA1C 6.3 (H) 05/30/2020 10:19 AM   HGBA1C 6.0 10/04/2019 09:10 AM   HGBA1C 6.0 07/13/2018 03:19 PM   GFR 77.99 10/04/2019 09:10 AM   GFR 83.12 07/14/2019 01:51 PM   MICROALBUR 4.0 (H) 09/05/2015 05:21 PM   MICROALBUR 1.5 07/13/2013 04:55  PM    Last diabetic Eye exam:  Lab Results  Component Value Date/Time   HMDIABEYEEXA No Retinopathy 12/24/2017 09:29 AM    Last diabetic Foot exam:  Lab Results  Component Value Date/Time   HMDIABFOOTEX yes 05/23/2009 12:00 AM     Lab Results  Component Value Date   CHOL 137 10/04/2019   HDL 38.50 (L) 10/04/2019   LDLCALC 67 10/04/2019   LDLDIRECT 86.0 09/05/2015   TRIG 155.0 (H) 10/04/2019   CHOLHDL 4 10/04/2019    Hepatic Function Latest Ref Rng & Units 10/04/2019 09/27/2018 03/25/2018  Total Protein 6.0 - 8.3 g/dL 6.9 7.2 6.8  Albumin 3.5 - 5.2 g/dL 4.1 4.1 4.2  AST 0 - 37 U/L 35 35 29  ALT 0 - 53 U/L 41 48(H) 46  Alk Phosphatase 39 - 117 U/L 98 60 74  Total Bilirubin 0.2 - 1.2 mg/dL 1.0 1.1 1.3(H)  Bilirubin, Direct 0.0 - 0.3 mg/dL - - -    Lab Results  Component Value Date/Time   TSH 2.18 10/04/2019 09:10 AM   TSH 1.78 09/04/2016 04:52 PM    CBC Latest Ref Rng & Units 10/04/2019 07/14/2019 10/07/2018  WBC 4.0 - 10.5 K/uL 5.8 7.1 5.6  Hemoglobin 13.0 - 17.0 g/dL 14.5 14.5 10.5(L)  Hematocrit 39.0 - 52.0 % 43.1 42.6 31.7(L)  Platelets 150.0 - 400.0 K/uL 95.0(L) 118.0(L) 77(L)    No results found for: VD25OH  Clinical ASCVD: Yes  The 10-year ASCVD risk score Mikey Bussing DC Jr., et al., 2013) is: 40.7%   Values  used to calculate the score:     Age: 11 years     Sex: Male     Is Non-Hispanic African American: No     Diabetic: Yes     Tobacco smoker: No     Systolic Blood Pressure: 588 mmHg     Is BP treated: No     HDL Cholesterol: 38.5 mg/dL     Total Cholesterol: 137 mg/dL    Depression screen [facility] 2/9 10/04/2019 12/29/2014 12/16/2013  Decreased Interest 0 0 0  Down, Depressed, Hopeless 0 0 0  PHQ - 2 Score 0 0 0  Some recent data might be hidden      Social History   Tobacco Use  Smoking Status Former Smoker  . Packs/day: 1.00  . Years: 20.00  . Pack years: 20.00  . Types: Cigarettes  . Quit date: 01/10/1996  . Years since quitting: 24.8  Smokeless Tobacco  Never Used   BP Readings from Last 3 Encounters:  05/30/20 117/78  10/21/19 126/70  10/04/19 108/60   Pulse Readings from Last 3 Encounters:  05/30/20 65  10/04/19 (!) 57  10/11/18 91   Wt Readings from Last 3 Encounters:  05/30/20 266 lb 9.6 oz (120.9 kg)  10/21/19 260 lb (117.9 kg)  10/04/19 260 lb (117.9 kg)   BMI Readings from Last 3 Encounters:  05/30/20 37.18 kg/m  10/21/19 37.84 kg/m  10/04/19 37.84 kg/m    Assessment/Interventions: Review of patient past medical history, allergies, medications, health status, including review of consultants reports, laboratory and other test data, was performed as part of comprehensive evaluation and provision of chronic care management services.   SDOH:  (Social Determinants of Health) assessments and interventions performed: No   CCM Care Plan  Allergies  Allergen Reactions  . Lipitor [Atorvastatin Calcium] Other (See Comments)    Muscle soreness  . Oxycodone Anxiety    Causes patient to become shaky and dizzy. Unable to tolerate.    Medications Reviewed Today    Reviewed by Tammi Sou, CMA (Certified Medical Assistant) on 10/30/20 at 18  Med List Status: <None>  Medication Order Taking? Sig Documenting Provider Last Dose Status Informant  albuterol (PROVENTIL HFA;VENTOLIN HFA) 108 (90 Base) MCG/ACT inhaler [id number] Yes Inhale 2 puffs into the lungs every 6 (six) hours as needed for wheezing or shortness of breath. Nafziger, Tommi Rumps, NP Taking Active Self  fluticasone (FLONASE) 50 MCG/ACT nasal spray [id number] Yes Place 2 sprays into both nostrils daily as needed. Nafziger, Tommi Rumps, NP Taking Active   glipiZIDE (GLUCOTROL XL) 10 MG 24 hr tablet [id number] Yes TAKE 1 TABLET (10 MG TOTAL) BY MOUTH DAILY WITH BREAKFAST. Nafziger, Tommi Rumps, NP Taking Active   Insulin Glargine [facility] KWIKPEN) 100 UNIT/ML [id number] Yes INJECT 0.4 MLS (40 UNITS TOTAL) INTO THE SKIN AT BEDTIME. [name], NP Taking Active   Insulin Pen  Needle (BD PEN NEEDLE NANO U/F) 32G X 4 MM MISC [id number] Yes USE AS DIRECTED TWICE A DAY Nafziger, Tommi Rumps, NP Taking Active   ketoconazole (NIZORAL) 2 % cream [id number] Yes Apply 1 application topically daily. Apply twice daily until rash has resolved Nafziger, Tommi Rumps, NP Taking Active   liraglutide (VICTOZA) 18 MG/3ML SOPN [id number] Yes Inject 1.2 mg into the skin daily. Nafziger, Tommi Rumps, NP Taking Active   meclizine (ANTIVERT) 12.5 MG tablet [id number] Yes TAKE ONE TABLET BY MOUTH THREE TIMES DAILY AS NEEDED FOR dizziness Nafziger, Tommi Rumps, NP Taking Active   omeprazole (PRILOSEC) 20 MG capsule [id number] Yes TAKE 1 CAPSULE  BY MOUTH EVERY DAY Nafziger, Tommi Rumps, NP Taking Active   oxybutynin (DITROPAN-XL) 10 MG 24 hr tablet [id number] Yes Take 10 mg by mouth daily. Ceasar Mons, MD Taking Active   simvastatin (ZOCOR) 20 MG tablet [id number] Yes TAKE 1 TABLET BY MOUTH EVERYDAY AT BEDTIME Nafziger, Tommi Rumps, NP Taking Active   tamsulosin (FLOMAX) 0.4 MG CAPS capsule [id number] Yes Take 1 capsule by mouth 2 (two) times daily. Ceasar Mons, MD Taking Active   TOVIAZ 8 MG TB24 tablet [id number] Yes Take 8 mg by mouth daily.  [provider] Taking Active Self           Med Note Inocente Salles Jun 11, 2016  2:52 PM)            Patient Active Problem List   Diagnosis Date Noted  . OA (osteoarthritis) of knee 10/04/2018  . Other secondary pulmonary hypertension ([place]) 04/06/2018  . Osteoarthritis, hand 05/05/2014  . Carpal tunnel syndrome 05/02/2014  . Eczema 01/23/2014  . Thrombocytopenia, unspecified ([place]) 11/19/2012  . Overactive bladder 08/24/2012  . Osteoarthritis of right knee 01/22/2012  . Benign positional vertigo 07/09/2011  . CAD, NATIVE VESSEL 10/22/2009  . EDEMA 08/03/2009  . Obstructive sleep apnea 05/03/2009  . SKIN LESION 09/23/2007  . Diabetes mellitus type 2, uncontrolled ([place]) 03/23/2007  . Hyperlipidemia 03/23/2007  . Essential hypertension 03/23/2007   . COLONIC POLYPS, HX OF 03/23/2007  . Morbid obesity ([place]) 03/23/2007    Immunization History  Administered Date(s) Administered  . Fluad Quad(high Dose 65+) 05/30/2020  . Influenza Split 06/22/2012  . Influenza Whole 05/23/2008, 08/03/2009, 06/04/2010  . Influenza, High Dose Seasonal PF 07/13/2013  . Influenza,inj,Quad PF,6+ Mos 06/04/2016, 07/13/2018, 06/28/2019  . Influenza,inj,quad, With Preservative 05/25/2017  . PFIZER(Purple Top)SARS-COV-2 Vaccination 10/30/2019, 11/20/2019, 05/25/2020  . Pneumococcal Conjugate-13 06/04/2016  . Pneumococcal Polysaccharide-23 05/23/2008, 10/04/2019  . Varicella Zoster Immune Globulin 06/22/2012  Hypertension, Hyperlipidemia, Diabetes, Coronary Artery Disease and Overactive Bladder   Conditions to be addressed/monitored:  Hypertension, Hyperlipidemia, Diabetes, Coronary Artery Disease and Overactive Bladder  Care Plan : [place]  Updates made by Viona Gilmore, [place] since 11/22/2020 12:00 AM    Problem: Problem: Hypertension, Hyperlipidemia, Diabetes, Coronary Artery Disease and Overactive Bladder     Long-Range Goal: Patient-Specific Goal   Start Date: 11/14/2020  Expected End Date: 11/14/2021  This Visit's Progress: On track  Priority: High  Note:   Current Barriers:  . Unable to independently monitor therapeutic efficacy . Unable to self administer medications as prescribed . Does not adhere to prescribed medication regimen  Pharmacist Clinical Goal(s):  Marland Kitchen Patient will achieve adherence to monitoring guidelines and medication adherence to achieve therapeutic efficacy . adhere to plan to optimize therapeutic regimen for diabetes as evidenced by report of adherence to recommended medication management changes through collaboration with PharmD and provider.   Interventions: . 1:1 collaboration with [name], NP regarding development and update of comprehensive plan of care as evidenced by provider attestation and  co-signature . Inter-disciplinary care team collaboration (see longitudinal plan of care) . Comprehensive medication review performed; medication list updated in electronic medical record  Hypertension (BP goal <130/80) -Controlled -Current treatment: . No medications -Medications previously tried: amlodipine  -Current home readings: does not check at home -Current dietary habits: has lot weight; eating more meat than anything -Current exercise habits: not exercising a whole lot -Denies hypotensive/hypertensive symptoms -Educated on Exercise goal of 150 minutes per week; Importance of home blood  pressure monitoring; -Counseled to monitor BP at home when symptomatic, document, and provide log at future appointments -Counseled on diet and exercise extensively  Hyperlipidemia: (LDL goal < 70) -Controlled -Current treatment: . Simvastatin 20 mg daily at bedtime -Medications previously tried: none -Current dietary patterns: he has cut back on portion sizes -Current exercise habits: not exercising a whole lot -Educated on Cholesterol goals;  Benefits of statin for ASCVD risk reduction; Importance of limiting foods high in cholesterol; Exercise goal of 150 minutes per week; -Recommended to continue current medication Recommended repeat lipid panel  Diabetes (A1c goal <7%) -Controlled -Current medications: . Victoza 1.2 mg inject daily . Basaglar inject 40 units into the skin at bedtime . Glipizide XL 10 mg 1 tablet daily -Medications previously tried: n/a  -Current home glucose readings . fasting glucose: only checking once a week . post prandial glucose: n/a -Reports hypoglycemic/hyperglycemic symptoms (once a month) -Current meal patterns:  . breakfast: n/a  . lunch: n/a . dinner: n/a . snacks: n/a . drinks: n/a -Current exercise: not exercising a whole lot -Educated on Exercise goal of 150 minutes per week; Benefits of routine self-monitoring of blood  sugar; Carbohydrate counting and/or plate method -Counseled to check feet daily and get yearly eye exams -Collaborated with PCP about switching to Trulicity and filling out patient assistance paperwork  BPH/overactive bladder (Goal: minimize symptoms of enlarged prostate) -Controlled -Current treatment  . Oxybutynin ER 10 mg 1 tablet daily . Tamsulosin 0.4 mg 1 capsule twice daily -Medications previously tried: Toviaz (switched to Oxybutynin) -Recommended to continue current medication  Allergic rhinitis (Goal: minimize symptoms) -Controlled -Current treatment   Albuterol HFA (Allergy, mostly in Spring)   Flonase 2 sprays daily as needed -Medications previously tried: none  -Recommended to continue current medication  Health Maintenance -Vaccine gaps: shingles, tetanus -Current therapy:  . Meclizine 12.5 mg 1 tablet as needed for vertigo -Educated on Cost vs benefit of each product must be carefully weighed by individual consumer -Patient is satisfied with current therapy and denies issues -Recommended to continue current medication  Patient Goals/Self-Care Activities . Patient will:  - take medications as prescribed check glucose daily, document, and provide at future appointments check blood pressure weekly, document, and provide at future appointments target a minimum of 150 minutes of moderate intensity exercise weekly  Follow Up Plan: Telephone follow up appointment with care management team member scheduled for: 4 months       Medication Assistance: Working on patient assistance for GLP1 and insulin depending on PCP's preference  Patient's preferred pharmacy is:  [facility] [place], [place] - [address] [place] [address] Phone: [phone] Fax: [phone]  [id number] [place] [id number]Phone: [phone]:  [phone] Uses pill box? No - requested packaging for next delivery Pt endorses 95% compliance  We discussed: Benefits of medication synchronization, packaging and delivery as well as enhanced pharmacist oversight with [facility]. Patient decided to: Utilize [facility] pharmacy for medication synchronization, packaging and delivery  Care Plan and Follow Up Patient Decision:  Patient agrees to Care Plan and Follow-up.  Plan: Telephone follow up appointment with care management team member scheduled for:  4 months  MJeni Salles PharmD BLake Cityat [phone] 4-5 months

## 2020-11-28 ENCOUNTER — Ambulatory Visit (INDEPENDENT_AMBULATORY_CARE_PROVIDER_SITE_OTHER): Payer: Medicare Other | Admitting: Adult Health

## 2020-11-28 ENCOUNTER — Other Ambulatory Visit: Payer: Self-pay

## 2020-11-28 VITALS — BP 122/70 | HR 68 | Temp 97.7°F | Resp 18 | Ht 71.0 in | Wt 269.0 lb

## 2020-11-28 DIAGNOSIS — E785 Hyperlipidemia, unspecified: Secondary | ICD-10-CM | POA: Diagnosis not present

## 2020-11-28 DIAGNOSIS — E1165 Type 2 diabetes mellitus with hyperglycemia: Secondary | ICD-10-CM

## 2020-11-28 DIAGNOSIS — N400 Enlarged prostate without lower urinary tract symptoms: Secondary | ICD-10-CM | POA: Diagnosis not present

## 2020-11-28 DIAGNOSIS — M1711 Unilateral primary osteoarthritis, right knee: Secondary | ICD-10-CM | POA: Diagnosis not present

## 2020-11-28 LAB — COMPREHENSIVE METABOLIC PANEL
ALT: 31 U/L (ref 0–53)
AST: 29 U/L (ref 0–37)
Albumin: 4.1 g/dL (ref 3.5–5.2)
Alkaline Phosphatase: 96 U/L (ref 39–117)
BUN: 14 mg/dL (ref 6–23)
CO2: 30 mEq/L (ref 19–32)
Calcium: 9.1 mg/dL (ref 8.4–10.5)
Chloride: 106 mEq/L (ref 96–112)
Creatinine, Ser: 0.83 mg/dL (ref 0.40–1.50)
GFR: 84.53 mL/min (ref >60.00)
Glucose, Bld: 123 mg/dL — ABNORMAL HIGH (ref 70–99)
Potassium: 4.6 mEq/L (ref 3.5–5.1)
Sodium: 142 mEq/L (ref 135–145)
Total Bilirubin: 1.1 mg/dL (ref 0.2–1.2)
Total Protein: 6.7 g/dL (ref 6.0–8.3)

## 2020-11-28 LAB — CBC WITH DIFFERENTIAL/PLATELET
Basophils Absolute: 0 10*3/uL (ref 0.0–0.1)
Basophils Relative: 0.3 % (ref 0.0–3.0)
Eosinophils Absolute: 0.1 10*3/uL (ref 0.0–0.7)
Eosinophils Relative: 2.6 % (ref 0.0–5.0)
HCT: 40.4 % (ref 39.0–52.0)
Hemoglobin: 13.9 g/dL (ref 13.0–17.0)
Lymphocytes Relative: 35.2 % (ref 12.0–46.0)
Lymphs Abs: 1.3 10*3/uL (ref 0.7–4.0)
MCHC: 34.4 g/dL (ref 30.0–36.0)
MCV: 95.4 fl (ref 78.0–100.0)
Monocytes Absolute: 0.4 10*3/uL (ref 0.1–1.0)
Monocytes Relative: 10.2 % (ref 3.0–12.0)
Neutro Abs: 2 10*3/uL (ref 1.4–7.7)
Neutrophils Relative %: 51.7 % (ref 43.0–77.0)
Platelets: 82 10*3/uL — ABNORMAL LOW (ref 150.0–400.0)
RBC: 4.24 Mil/uL (ref 4.22–5.81)
RDW: 13.9 % (ref 11.5–15.5)
WBC: 3.8 10*3/uL — ABNORMAL LOW (ref 4.0–10.5)

## 2020-11-28 LAB — LIPID PANEL
Cholesterol: 149 mg/dL (ref 0–200)
HDL: 38.9 mg/dL — ABNORMAL LOW (ref >39.00)
LDL Cholesterol: 78 mg/dL (ref 0–99)
NonHDL: 110.55
Total CHOL/HDL Ratio: 4
Triglycerides: 163 mg/dL — ABNORMAL HIGH (ref 0.0–149.0)
VLDL: 32.6 mg/dL (ref 0.0–40.0)

## 2020-11-28 LAB — PSA: PSA: 2.74 ng/mL (ref 0.10–4.00)

## 2020-11-28 LAB — TSH: TSH: 2.18 u[IU]/mL (ref 0.35–4.50)

## 2020-11-28 LAB — HEMOGLOBIN A1C: Hgb A1c MFr Bld: 6.3 % (ref 4.6–6.5)

## 2020-11-28 NOTE — Progress Notes (Signed)
Subjective:    Patient ID: Jacob Bishop, male    DOB: 1941-06-25, 80 y.o.   MRN: [id number]  HPI Patient presents for yearly preventative medicine examination. He is a pleasant 80 year old male who  has a past medical history of AI (aortic insufficiency) (01/11/2018), Arthritis, CAD in native artery, Cellulitis, Dermatitis, Diabetes mellitus, Diastolic dysfunction (00/86/7619), Diverticulosis (01/31/2016), DOE (dyspnea on exertion), Edema, Fatty liver (09/18/2004), History of kidney stones (05/23/2002), colonic polyps (01/31/2016), Hyperlipidemia, Hypertension, Internal hemorrhoids (01/31/2016), Obesity, OSA (obstructive sleep apnea), Positional vertigo, Pulmonary hypertension ([place]) (01/11/2018), Rhinosinusitis, Skin lesion, and TR (tricuspid regurgitation) (01/11/2018).  DM -currently prescribed glipizide 10 mg daily, Victoza 18 mg daily and Basaglar 40 units nightly.  Back in October 2021 he had talked to the clinical pharmacist and reported concerns about the affordability with Lantus and Victoza.  At this time we wanted to switch him to Trulicity, but he never started this medication.  There is some concern on adherence to his diabetes medication as glipizide has not been filled in roughly 2 years and Victoza has not been filled in the last year. He reports that he has been getting Victoza from " a friend" but has not take it in a few weeks but has not taken Glipizide " in a long time". Does check BS infrequently and reports readings in the 140-150's. No symptoms of hypoglycemia  Lab Results  Component Value Date   HGBA1C 6.3 (H) 05/30/2020   Hyperlipidemia - takes Zocor 20 mg daily. He denies myalgia or fatigue  Lab Results  Component Value Date   CHOL 137 10/04/2019   HDL 38.50 (L) 10/04/2019   LDLCALC 67 10/04/2019   LDLDIRECT 86.0 09/05/2015   TRIG 155.0 (H) 10/04/2019   CHOLHDL 4 10/04/2019    BPH - Symptoms controlled with Ditropan 10 mg daily and Flomax 0.4 mg  BID  Osteoarthritis - mostly in hands and left knee. Uses Aleve daily which helps   All immunizations and health maintenance protocols were reviewed with the patient and needed orders were placed.  Appropriate screening laboratory values were ordered for the patient including screening of hyperlipidemia, renal function and hepatic function. If indicated by BPH, a PSA was ordered.  Medication reconciliation,  past medical history, social history, problem list and allergies were reviewed in detail with the patient  Goals were established with regard to weight loss, exercise, and  diet in compliance with medications Wt Readings from Last 3 Encounters:  11/28/20 269 lb (122 kg)  05/30/20 266 lb 9.6 oz (120.9 kg)  10/21/19 260 lb (117.9 kg)    Review of Systems  Constitutional: Negative.   HENT: Negative.   Eyes: Negative.   Respiratory: Negative.   Cardiovascular: Negative.   Gastrointestinal: Negative.   Endocrine: Negative.   Genitourinary: Negative.   Musculoskeletal: Positive for arthralgias and gait problem.  Skin: Negative.   Allergic/Immunologic: Negative.   Hematological: Negative.   Psychiatric/Behavioral: Negative.   All other systems reviewed and are negative.  Past Medical History:  Diagnosis Date  . AI (aortic insufficiency) 01/11/2018   Trace, noted on ECHO  . Arthritis   . CAD in native artery    Nuclear stress test 10/19: EF 58, normal perfusion, low risk  . Cellulitis   . Dermatitis   . Diabetes mellitus    type II  . Diastolic dysfunction 50/93/2671   Mild, noted on ECHO  . Diverticulosis 01/31/2016   ASCENDING COLON AND CECUM, NOTED ON COLONOSCOPY  . DOE (dyspnea  on exertion)   . Edema   . Fatty liver 09/18/2004   Noted on [place] ABD  . History of kidney stones 05/23/2002   Noted on CT Abd  . Hx of colonic polyps 01/31/2016  . Hyperlipidemia   . Hypertension   . Internal hemorrhoids 01/31/2016   Noted on colonoscopy  . Obesity   . OSA  (obstructive sleep apnea)    cpap  . Positional vertigo   . Pulmonary hypertension ([place]) 01/11/2018   Mild, noted on ECHO  . Rhinosinusitis   . Skin lesion   . TR (tricuspid regurgitation) 01/11/2018   Trace, noted on ECHO    Social History   Socioeconomic History  . Marital status: Married    Spouse name: Not on file  . Number of children: Not on file  . Years of education: Not on file  . Highest education level: Not on file  Occupational History  . Occupation: Financial controller, Health and safety inspector: [employer]  Tobacco Use  . Smoking status: Former Smoker    Packs/day: 1.00    Years: 20.00    Pack years: 20.00    Types: Cigarettes    Quit date: 01/10/1996    Years since quitting: 24.9  . Smokeless tobacco: Never Used  Vaping Use  . Vaping Use: Never used  Substance and Sexual Activity  . Alcohol use: No  . Drug use: No  . Sexual activity: Not on file  Other Topics Concern  . Not on file  Social History Narrative   Grew up in [place]. Mother was [place], Father [place].   He works daily - owns a [employer]    Married for 30+ years   Has a daughter who lives in [place] Strain: High Risk  . Difficulty of Paying Living Expenses: Hard  Food Insecurity: Not on file  Transportation Needs: No Transportation Needs  . Lack of Transportation (Medical): No  . Lack of Transportation (Non-Medical): No  Physical Activity: Not on file  Stress: Not on file  Social Connections: Not on file  Intimate Partner Violence: Not on file    Past Surgical History:  Procedure Laterality Date  . CARDIAC CATHETERIZATION    . COLONOSCOPY    . CORONARY STENT PLACEMENT     3 stents /1997  . POLYPECTOMY    . TONSILLECTOMY    . TOTAL KNEE ARTHROPLASTY Right 10/04/2018   Procedure: RIGHT TOTAL KNEE ARTHROPLASTY;  Surgeon: Gaynelle Arabian, MD;  Location: [facility];  Service: Orthopedics;  Laterality:  Right;  Adductor Block    Family History  Problem Relation Age of Onset  . Cancer Mother        ? lung cancer  . Heart disease Father   . Heart attack Father   . Heart disease Sister   . Diabetes Sister   . Heart attack Sister   . Heart attack Brother     Allergies  Allergen Reactions  . Lipitor [Atorvastatin Calcium] Other (See Comments)    Muscle soreness  . Oxycodone Anxiety    Causes patient to become shaky and dizzy. Unable to tolerate.    Current Outpatient Medications on File Prior to Visit  Medication Sig Dispense Refill  . albuterol (PROVENTIL HFA;VENTOLIN HFA) 108 (90 Base) MCG/ACT inhaler Inhale 2 puffs into the lungs every 6 (six) hours as needed for wheezing or shortness of breath. 1 Inhaler 0  . fluticasone (FLONASE)  50 MCG/ACT nasal spray Place 2 sprays into both nostrils daily as needed. 16 mL 3  . Insulin Glargine (BASAGLAR KWIKPEN) 100 UNIT/ML INJECT 0.4 MLS (40 UNITS TOTAL) INTO THE SKIN AT BEDTIME. 15 mL 1  . Insulin Pen Needle (BD PEN NEEDLE NANO U/F) 32G X 4 MM MISC USE AS DIRECTED TWICE A DAY 200 each 0  . ketoconazole (NIZORAL) 2 % cream Apply 1 application topically daily. Apply twice daily until rash has resolved 30 g 0  . meclizine (ANTIVERT) 12.5 MG tablet TAKE ONE TABLET BY MOUTH THREE TIMES DAILY AS NEEDED FOR dizziness 30 tablet 0  . omeprazole (PRILOSEC) 20 MG capsule TAKE 1 CAPSULE BY MOUTH EVERY DAY 90 capsule 0  . oxybutynin (DITROPAN-XL) 10 MG 24 hr tablet Take 10 mg by mouth daily.    . simvastatin (ZOCOR) 20 MG tablet TAKE 1 TABLET BY MOUTH EVERYDAY AT BEDTIME 90 tablet 1  . tamsulosin (FLOMAX) 0.4 MG CAPS capsule Take 1 capsule by mouth 2 (two) times daily.     No current facility-administered medications on file prior to visit.    BP 122/70 (BP Location: Left Arm, Patient Position: Sitting, Cuff Size: Normal)   Pulse 68   Temp 97.7 F (36.5 C) (Oral)   Resp 18   Ht 5\' 11"  (1.803 m)   Wt 269 lb (122 kg)   SpO2 98%   BMI 37.52 kg/m        Objective:   Physical Exam Vitals and nursing note reviewed.  Constitutional:      General: He is not in acute distress.    Appearance: Normal appearance. He is well-developed. He is obese.  HENT:     Head: Normocephalic and atraumatic.     Right Ear: Tympanic membrane, ear canal and external ear normal. There is no impacted cerumen.     Left Ear: Tympanic membrane, ear canal and external ear normal. There is no impacted cerumen.     Nose: Nose normal. No congestion or rhinorrhea.     Mouth/Throat:     Mouth: Mucous membranes are moist.     Pharynx: Oropharynx is clear. No oropharyngeal exudate or posterior oropharyngeal erythema.  Eyes:     General:        Right eye: No discharge.        Left eye: No discharge.     Extraocular Movements: Extraocular movements intact.     Conjunctiva/sclera: Conjunctivae normal.     Pupils: Pupils are equal, round, and reactive to light.  Neck:     Vascular: No carotid bruit.     Trachea: No tracheal deviation.  Cardiovascular:     Rate and Rhythm: Normal rate and regular rhythm.     Pulses: Normal pulses.     Heart sounds: Normal heart sounds. No murmur heard. No friction rub. No gallop.   Pulmonary:     Effort: Pulmonary effort is normal. No respiratory distress.     Breath sounds: Normal breath sounds. No stridor. No wheezing, rhonchi or rales.  Chest:     Chest wall: No tenderness.  Abdominal:     General: Bowel sounds are normal. There is no distension.     Palpations: Abdomen is soft. There is no mass.     Tenderness: There is no abdominal tenderness. There is no right CVA tenderness, left CVA tenderness, guarding or rebound.     Hernia: No hernia is present.  Musculoskeletal:        General: No swelling, tenderness, deformity  or signs of injury. Normal range of motion.     Right lower leg: No edema.     Left lower leg: No edema.  Lymphadenopathy:     Cervical: No cervical adenopathy.  Skin:    General: Skin is warm and  dry.     Capillary Refill: Capillary refill takes less than 2 seconds.     Coloration: Skin is not jaundiced or pale.     Findings: No bruising, erythema, lesion or rash.  Neurological:     General: No focal deficit present.     Mental Status: He is alert and oriented to person, place, and time.     Cranial Nerves: No cranial nerve deficit.     Sensory: No sensory deficit.     Motor: No weakness.     Coordination: Coordination normal.     Gait: Gait abnormal.     Deep Tendon Reflexes: Reflexes normal.  Psychiatric:        Mood and Affect: Mood normal.        Behavior: Behavior normal.        Thought Content: Thought content normal.        Judgment: Judgment normal.       Assessment & Plan:  1. Hyperlipidemia, unspecified hyperlipidemia type - Consider increase in statin  - CBC with Differential/Platelet; Future - Hemoglobin A1c; Future - Comprehensive metabolic panel; Future - Lipid panel; Future - TSH; Future - Ambulatory referral to Ophthalmology - TSH - Lipid panel - Comprehensive metabolic panel - Hemoglobin A1c - CBC with Differential/Platelet  2. Benign prostatic hyperplasia without lower urinary tract symptoms  - PSA; Future - PSA  3. Uncontrolled type 2 diabetes mellitus with hyperglycemia ([place]) - Will try to get him approved for patient assistance through [employer] for [employer] and Trulicity  - D/c Glipizide and Victoza  - CBC with Differential/Platelet; Future - Hemoglobin A1c; Future - Comprehensive metabolic panel; Future - Lipid panel; Future - TSH; Future - Ambulatory referral to Ophthalmology - TSH - Lipid panel - Comprehensive metabolic panel - Hemoglobin A1c - CBC with Differential/Platelet  4. Morbid obesity ([place]) - Encouraged weight loss through diet and exercise   5. Osteoarthritis of right knee, unspecified osteoarthritis type - Continue with Aleve. He does not want to have any more surgeries at this time    [name], NP

## 2020-11-28 NOTE — Patient Instructions (Signed)
We will follow up with you regarding your blood work   Please find out what your annual household income before taxes was so I we can submit the application for diabetic medication

## 2020-11-29 ENCOUNTER — Telehealth: Payer: Self-pay | Admitting: Adult Health

## 2020-11-29 NOTE — Telephone Encounter (Signed)
Updated patient on his labs.  His A1c stable at 6.3.  He will get me his annual household income so I can send in information for [employer] program.  Hopefully we can get him controlled with just Trulicity and back off on insulin therapy.

## 2020-11-30 ENCOUNTER — Other Ambulatory Visit: Payer: Self-pay | Admitting: Adult Health

## 2020-11-30 ENCOUNTER — Telehealth: Payer: Self-pay | Admitting: Pharmacist

## 2020-11-30 ENCOUNTER — Telehealth: Payer: Self-pay | Admitting: Adult Health

## 2020-11-30 DIAGNOSIS — H8113 Benign paroxysmal vertigo, bilateral: Secondary | ICD-10-CM

## 2020-11-30 MED ORDER — MECLIZINE HCL 12.5 MG PO TABS: 12.5000 mg | ORAL_TABLET | Freq: Three times a day (TID) | ORAL | 0 refills | Status: DC | PRN

## 2020-11-30 MED ORDER — SIMVASTATIN 20 MG PO TABS: 20.0000 mg | ORAL_TABLET | Freq: Every day | ORAL | 3 refills | Status: DC

## 2020-11-30 NOTE — Telephone Encounter (Signed)
Information given to Maddie.

## 2020-11-30 NOTE — Telephone Encounter (Signed)
Patient states he was told to call and give his income.  He makes $25,584 annually.   Please advise.

## 2020-11-30 NOTE — Telephone Encounter (Signed)
Reviewed chart for medication changes ahead of medication coordination call.   PCP visit 11/28/20: Discontinued glipizide and Victoza. Will switch Victoza to Trulicity and will work on patient assistance for [employer] with [employer].    BP Readings from Last 3 Encounters:  11/28/20 122/70  05/30/20 117/78  10/21/19 126/70    Lab Results  Component Value Date   HGBA1C 6.3 11/28/2020     Patient obtains medications through Adherence Packaging  30 Days   Last adherence delivery included: (medication name and frequency)  Patient declined (meds) last month due to PRN use/additional supply on hand. Explanation of abundance on hand (ie #30 due to overlapping fills or previous adherence issues etc)  Patient is due for next adherence delivery on: 11/30/2020.  Called patient and reviewed medications and coordinated delivery.  This delivery to include: . Simvastatin (ZOCOR) 20 mg: one tablet at bedtime . Tamsulosin 0.4 mg: one capsule in the morning and one capsule at bedtime . Meclizine 12.5 mg tablet: one tablet 3 times daily as needed (vials) . Oxybutynin 10 mg XL: one tablet in the morning  Patient declined the following medications: . Insulin Glargine (BASAGLAR KWIKPEN) 100 UNIT/ML: inject 0.4 mls into skin at bedtime (supply on hand) . Flonase nasal spray: two sprays in each nostril daily as needed (PRN) . Trulicity inject 7.62 mg weekly (working on patient assistance)  Patient needs refills for simvastatin and meclizine.  Confirmed delivery date of 11-30-2020, advised patient that pharmacy will contact them the morning of delivery.

## 2021-01-07 ENCOUNTER — Telehealth: Payer: Self-pay | Admitting: Pharmacist

## 2021-01-07 NOTE — Chronic Care Management (AMB) (Signed)
Chronic Care Management Pharmacy Assistant   Name: Jacob Bishop  MRN: [id number] DOB: 1940/12/25  Reason for Encounter: Medication Review/ Medication Coordination Call.    Recent office visits:  None.  Recent consult visits:  None.   Hospital visits:  None in previous 6 months  Medications: Outpatient Encounter Medications as of 01/07/2021  Medication Sig  . albuterol (PROVENTIL HFA;VENTOLIN HFA) 108 (90 Base) MCG/ACT inhaler Inhale 2 puffs into the lungs every 6 (six) hours as needed for wheezing or shortness of breath.  . fluticasone (FLONASE) 50 MCG/ACT nasal spray Place 2 sprays into both nostrils daily as needed.  . Insulin Glargine (BASAGLAR KWIKPEN) 100 UNIT/ML INJECT 0.4 MLS (40 UNITS TOTAL) INTO THE SKIN AT BEDTIME.  . Insulin Pen Needle (BD PEN NEEDLE NANO U/F) 32G X 4 MM MISC USE AS DIRECTED TWICE A DAY  . ketoconazole (NIZORAL) 2 % cream Apply 1 application topically daily. Apply twice daily until rash has resolved  . meclizine (ANTIVERT) 12.5 MG tablet Take 1 tablet (12.5 mg total) by mouth 3 (three) times daily as needed for dizziness.  Marland Kitchen omeprazole (PRILOSEC) 20 MG capsule TAKE 1 CAPSULE BY MOUTH EVERY DAY  . oxybutynin (DITROPAN-XL) 10 MG 24 hr tablet Take 10 mg by mouth daily.  . simvastatin (ZOCOR) 20 MG tablet Take 1 tablet (20 mg total) by mouth daily at 6 PM.  . tamsulosin (FLOMAX) 0.4 MG CAPS capsule Take 1 capsule by mouth 2 (two) times daily.   No facility-administered encounter medications on file as of 01/07/2021.    Reviewed chart for medication changes ahead of medication coordination call.  No OVs, Consults, or hospital visits since last care coordination call/Pharmacist visit. (If appropriate, list visit date, provider name)  No medication changes indicated OR if recent visit, treatment plan here.  BP Readings from Last 3 Encounters:  11/28/20 122/70  05/30/20 117/78  10/21/19 126/70    Lab Results  Component Value Date   HGBA1C 6.3  11/28/2020     Patient obtains medications through Adherence Packaging  30 Days   Last adherence delivery included:  Simvastatin (ZOCOR) 20 mg: one tablet at bedtime  Tamsulosin 0.4 mg: one capsule in the morning and one capsule at bedtime  Meclizine 12.5 mg tablet: one tablet 3 times daily as needed (vials)  Oxybutynin 10 mg XL: one tablet in the morning   Patient declined (meds) last month:  Insulin Glargine (BASAGLAR KWIKPEN) 100 UNIT/ML: inject 0.4 mls into skin at bedtime (supply on hand)  Flonase nasal spray: two sprays in each nostril daily as needed (PRN)  Trulicity inject 2.02 mg weekly (working on patient assistance)   Patient is due for next adherence delivery on: 01/10/21. Called patient and reviewed medications and coordinated delivery.  This delivery to include:  Simvastatin (ZOCOR) 20 mg: one tablet at bedtime  Tamsulosin 0.4 mg: one capsule in the morning and one capsule at bedtime  Meclizine 12.5 mg tablet: one tablet 3 times daily as needed (vials)  Oxybutynin 10 mg XL: one tablet in the morning   Patient declined the following medications.  Insulin Glargine (BASAGLAR KWIKPEN) 100 UNIT/ML: inject 0.4 mls into skin at bedtime (supply on hand)  Flonase nasal spray: two sprays in each nostril daily as needed (PRN)  Trulicity inject 5.42 mg weekly (working on patient assistance)   Confirmed delivery date of 01/08/21, advised patient that pharmacy will contact them the morning of delivery.  Star Rating Drugs:  Simvastatin 20mg  - last filled on  10/29/20 30DS at [facility] [phone]

## 2021-02-04 ENCOUNTER — Ambulatory Visit (INDEPENDENT_AMBULATORY_CARE_PROVIDER_SITE_OTHER): Payer: Medicare Other

## 2021-02-04 ENCOUNTER — Other Ambulatory Visit: Payer: Self-pay

## 2021-02-04 ENCOUNTER — Telehealth: Payer: Self-pay | Admitting: Pharmacist

## 2021-02-04 DIAGNOSIS — Z0100 Encounter for examination of eyes and vision without abnormal findings: Secondary | ICD-10-CM

## 2021-02-04 DIAGNOSIS — Z0000 Encounter for general adult medical examination without abnormal findings: Secondary | ICD-10-CM

## 2021-02-04 NOTE — Chronic Care Management (AMB) (Signed)
Chronic Care Management Pharmacy Assistant   Name: Jacob Bishop  MRN: [id number] DOB: [birth year]  Reason for Encounter: Medication Review/ Medication Coordination Call.    Recent office visits:  None.   Recent consult visits:  None.   Hospital visits:  None in previous 6 months  Medications: Outpatient Encounter Medications as of 02/04/2021  Medication Sig   albuterol (PROVENTIL HFA;VENTOLIN HFA) 108 (90 Base) MCG/ACT inhaler Inhale 2 puffs into the lungs every 6 (six) hours as needed for wheezing or shortness of breath.   fluticasone (FLONASE) 50 MCG/ACT nasal spray Place 2 sprays into both nostrils daily as needed.   Insulin Glargine (BASAGLAR KWIKPEN) 100 UNIT/ML INJECT 0.4 MLS (40 UNITS TOTAL) INTO THE SKIN AT BEDTIME.   Insulin Pen Needle (BD PEN NEEDLE NANO U/F) 32G X 4 MM MISC USE AS DIRECTED TWICE A DAY   ketoconazole (NIZORAL) 2 % cream Apply 1 application topically daily. Apply twice daily until rash has resolved   meclizine (ANTIVERT) 12.5 MG tablet Take 1 tablet (12.5 mg total) by mouth 3 (three) times daily as needed for dizziness.   omeprazole (PRILOSEC) 20 MG capsule TAKE 1 CAPSULE BY MOUTH EVERY DAY   oxybutynin (DITROPAN-XL) 10 MG 24 hr tablet Take 10 mg by mouth daily.   simvastatin (ZOCOR) 20 MG tablet Take 1 tablet (20 mg total) by mouth daily at 6 PM.   tamsulosin (FLOMAX) 0.4 MG CAPS capsule Take 1 capsule by mouth 2 (two) times daily.   No facility-administered encounter medications on file as of 02/04/2021.    Reviewed chart for medication changes ahead of medication coordination call.  No OVs, Consults, or hospital visits since last care coordination call/Pharmacist visit. (If appropriate, list visit date, provider name)  No medication changes indicated OR if recent visit, treatment plan here.  BP Readings from Last 3 Encounters:  11/28/20 122/70  05/30/20 117/78  10/21/19 126/70    Lab Results  Component Value Date   HGBA1C 6.3 11/28/2020      Patient obtains medications through Adherence Packaging  30 Days   Last adherence delivery included: (medication name and frequency) Simvastatin (ZOCOR) 20 mg: one tablet at bedtime Tamsulosin 0.4 mg: one capsule in the morning and one capsule at bedtime Meclizine 12.5 mg tablet: one tablet 3 times daily as needed (vials) Oxybutynin 10 mg XL: one tablet in the morning   Patient declined (meds) last month: Insulin Glargine (BASAGLAR KWIKPEN) 100 UNIT/ML: inject 0.4 mls into skin at bedtime (supply on hand) Flonase nasal spray: two sprays in each nostril daily as needed (PRN) Trulicity inject 0.26 mg weekly (working on patient assistance)   Patient is due for next adherence delivery on: 02/13/21. Called patient and reviewed medications and coordinated delivery.  This delivery to include: Simvastatin (ZOCOR) 20 mg: one tablet at bedtime Tamsulosin 0.4 mg: one capsule in the morning and one capsule at bedtime Meclizine 12.5 mg tablet: one tablet 3 times daily as needed (vials) Oxybutynin 10 mg XL: one tablet in the morning Flonase nasal spray: two sprays in each nostril daily as needed (PRN) Doxycycline 100mg  - take 1 tablet at breakfast and 1 tablet with evening meal.  Patient declined the following medications (meds) due to (reason) Insulin Glargine (BASAGLAR KWIKPEN) 100 UNIT/ML: inject 0.4 mls into skin at bedtime (supply on hand) Trulicity inject 3.78 mg weekly (working on patient assistance)  Confirmed delivery date of 02/13/21, advised patient that pharmacy will contact them the morning of delivery.   Star Rating  Drugs:  Simvastatin 20mg  - last filled on 01/08/21 30DS at [facility] Pharmacist Assistant [phone]

## 2021-02-04 NOTE — Patient Instructions (Signed)
Mr. Bunte , Thank you for taking time to come for your Medicare Wellness Visit. I appreciate your ongoing commitment to your health goals. Please review the following plan we discussed and let me know if I can assist you in the future.   Screening recommendations/referrals: Colonoscopy: no longer required  Recommended yearly ophthalmology/optometry visit for glaucoma screening and checkup Recommended yearly dental visit for hygiene and checkup  Vaccinations: Influenza vaccine: due in fall 2022 Pneumococcal vaccine: completed series  Tdap vaccine: obtain with injury  Shingles vaccine: completed series   Advanced directives: will provide copies   Conditions/risks identified: none   Next appointment: none  Preventive Care 15 Years and Older, Male Preventive care refers to lifestyle choices and visits with your health care provider that can promote health and wellness. What does preventive care include? A yearly physical exam. This is also called an annual well check. Dental exams once or twice a year. Routine eye exams. Ask your health care provider how often you should have your eyes checked. Personal lifestyle choices, including: Daily care of your teeth and gums. Regular physical activity. Eating a healthy diet. Avoiding tobacco and drug use. Limiting alcohol use. Practicing safe sex. Taking low doses of aspirin every day. Taking vitamin and mineral supplements as recommended by your health care provider. What happens during an annual well check? The services and screenings done by your health care provider during your annual well check will depend on your age, overall health, lifestyle risk factors, and family history of disease. Counseling  Your health care provider may ask you questions about your: Alcohol use. Tobacco use. Drug use. Emotional well-being. Home and relationship well-being. Sexual activity. Eating habits. History of falls. Memory and ability to  understand (cognition). Work and work Statistician. Screening  You may have the following tests or measurements: Height, weight, and BMI. Blood pressure. Lipid and cholesterol levels. These may be checked every 5 years, or more frequently if you are over 13 years old. Skin check. Lung cancer screening. You may have this screening every year starting at age 56 if you have a 30-pack-year history of smoking and currently smoke or have quit within the past 15 years. Fecal occult blood test (FOBT) of the stool. You may have this test every year starting at age 23. Flexible sigmoidoscopy or colonoscopy. You may have a sigmoidoscopy every 5 years or a colonoscopy every 10 years starting at age 95. Prostate cancer screening. Recommendations will vary depending on your family history and other risks. Hepatitis C blood test. Hepatitis B blood test. Sexually transmitted disease (STD) testing. Diabetes screening. This is done by checking your blood sugar (glucose) after you have not eaten for a while (fasting). You may have this done every 1-3 years. Abdominal aortic aneurysm (AAA) screening. You may need this if you are a current or former smoker. Osteoporosis. You may be screened starting at age 30 if you are at high risk. Talk with your health care provider about your test results, treatment options, and if necessary, the need for more tests. Vaccines  Your health care provider may recommend certain vaccines, such as: Influenza vaccine. This is recommended every year. Tetanus, diphtheria, and acellular pertussis (Tdap, Td) vaccine. You may need a Td booster every 10 years. Zoster vaccine. You may need this after age 2. Pneumococcal 13-valent conjugate (PCV13) vaccine. One dose is recommended after age 50. Pneumococcal polysaccharide (PPSV23) vaccine. One dose is recommended after age 98. Talk to your health care provider about which screenings  and vaccines you need and how often you need them. This  information is not intended to replace advice given to you by your health care provider. Make sure you discuss any questions you have with your health care provider. Document Released: 09/07/2015 Document Revised: 04/30/2016 Document Reviewed: 06/12/2015 Elsevier Interactive Patient Education  2017 [place] Prevention in the Home Falls can cause injuries. They can happen to people of all ages. There are many things you can do to make your home safe and to help prevent falls. What can I do on the outside of my home? Regularly fix the edges of walkways and driveways and fix any cracks. Remove anything that might make you trip as you walk through a door, such as a raised step or threshold. Trim any bushes or trees on the path to your home. Use bright outdoor lighting. Clear any walking paths of anything that might make someone trip, such as rocks or tools. Regularly check to see if handrails are loose or broken. Make sure that both sides of any steps have handrails. Any raised decks and porches should have guardrails on the edges. Have any leaves, snow, or ice cleared regularly. Use sand or salt on walking paths during winter. Clean up any spills in your garage right away. This includes oil or grease spills. What can I do in the bathroom? Use night lights. Install grab bars by the toilet and in the tub and shower. Do not use towel bars as grab bars. Use non-skid mats or decals in the tub or shower. If you need to sit down in the shower, use a plastic, non-slip stool. Keep the floor dry. Clean up any water that spills on the floor as soon as it happens. Remove soap buildup in the tub or shower regularly. Attach bath mats securely with double-sided non-slip rug tape. Do not have throw rugs and other things on the floor that can make you trip. What can I do in the bedroom? Use night lights. Make sure that you have a light by your bed that is easy to reach. Do not use any sheets or  blankets that are too big for your bed. They should not hang down onto the floor. Have a firm chair that has side arms. You can use this for support while you get dressed. Do not have throw rugs and other things on the floor that can make you trip. What can I do in the kitchen? Clean up any spills right away. Avoid walking on wet floors. Keep items that you use a lot in easy-to-reach places. If you need to reach something above you, use a strong step stool that has a grab bar. Keep electrical cords out of the way. Do not use floor polish or wax that makes floors slippery. If you must use wax, use non-skid floor wax. Do not have throw rugs and other things on the floor that can make you trip. What can I do with my stairs? Do not leave any items on the stairs. Make sure that there are handrails on both sides of the stairs and use them. Fix handrails that are broken or loose. Make sure that handrails are as long as the stairways. Check any carpeting to make sure that it is firmly attached to the stairs. Fix any carpet that is loose or worn. Avoid having throw rugs at the top or bottom of the stairs. If you do have throw rugs, attach them to the floor with carpet tape.  Make sure that you have a light switch at the top of the stairs and the bottom of the stairs. If you do not have them, ask someone to add them for you. What else can I do to help prevent falls? Wear shoes that: Do not have high heels. Have rubber bottoms. Are comfortable and fit you well. Are closed at the toe. Do not wear sandals. If you use a stepladder: Make sure that it is fully opened. Do not climb a closed stepladder. Make sure that both sides of the stepladder are locked into place. Ask someone to hold it for you, if possible. Clearly mark and make sure that you can see: Any grab bars or handrails. First and last steps. Where the edge of each step is. Use tools that help you move around (mobility aids) if they are  needed. These include: Canes. Walkers. Scooters. Crutches. Turn on the lights when you go into a dark area. Replace any light bulbs as soon as they burn out. Set up your furniture so you have a clear path. Avoid moving your furniture around. If any of your floors are uneven, fix them. If there are any pets around you, be aware of where they are. Review your medicines with your doctor. Some medicines can make you feel dizzy. This can increase your chance of falling. Ask your doctor what other things that you can do to help prevent falls. This information is not intended to replace advice given to you by your health care provider. Make sure you discuss any questions you have with your health care provider. Document Released: 06/07/2009 Document Revised: 01/17/2016 Document Reviewed: 09/15/2014 Elsevier Interactive Patient Education  2017 [employer].

## 2021-02-04 NOTE — Progress Notes (Addendum)
Subjective:   Jacob Bishop is a 80 y.o. male who presents for an Initial Medicare Annual Wellness Visit.   I connected with Jacob Bishop today by telephone and verified that I am speaking with the correct person using two identifiers. Location patient: home Location provider: work Persons participating in the virtual visit: patient, provider.   I discussed the limitations, risks, security and privacy concerns of performing an evaluation and management service by telephone and the availability of in person appointments. I also discussed with the patient that there may be a patient responsible charge related to this service. The patient expressed understanding and verbally consented to this telephonic visit.    Interactive audio and video telecommunications were attempted between this provider and patient, however failed, due to patient having technical difficulties OR patient did not have access to video capability.  We continued and completed visit with audio only.    Review of Systems    N/a     Objective:    There were no vitals filed for this visit. There is no height or weight on file to calculate BMI.  Advanced Directives 10/04/2018 09/27/2018 01/31/2016 01/17/2016 09/18/2015 08/23/2015  Does Patient Have a Medical Advance Directive? No No No No No No  Would patient like information on creating a medical advance directive? No - Patient declined No - Patient declined No - patient declined information No - patient declined information No - patient declined information No - patient declined information    Current Medications (verified) Outpatient Encounter Medications as of 02/04/2021  Medication Sig   albuterol (PROVENTIL HFA;VENTOLIN HFA) 108 (90 Base) MCG/ACT inhaler Inhale 2 puffs into the lungs every 6 (six) hours as needed for wheezing or shortness of breath.   fluticasone (FLONASE) 50 MCG/ACT nasal spray Place 2 sprays into both nostrils daily as needed.   Insulin Glargine  (BASAGLAR KWIKPEN) 100 UNIT/ML INJECT 0.4 MLS (40 UNITS TOTAL) INTO THE SKIN AT BEDTIME.   Insulin Pen Needle (BD PEN NEEDLE NANO U/F) 32G X 4 MM MISC USE AS DIRECTED TWICE A DAY   ketoconazole (NIZORAL) 2 % cream Apply 1 application topically daily. Apply twice daily until rash has resolved   meclizine (ANTIVERT) 12.5 MG tablet Take 1 tablet (12.5 mg total) by mouth 3 (three) times daily as needed for dizziness.   omeprazole (PRILOSEC) 20 MG capsule TAKE 1 CAPSULE BY MOUTH EVERY DAY   oxybutynin (DITROPAN-XL) 10 MG 24 hr tablet Take 10 mg by mouth daily.   simvastatin (ZOCOR) 20 MG tablet Take 1 tablet (20 mg total) by mouth daily at 6 PM.   tamsulosin (FLOMAX) 0.4 MG CAPS capsule Take 1 capsule by mouth 2 (two) times daily.   No facility-administered encounter medications on file as of 02/04/2021.    Allergies (verified) Lipitor [atorvastatin calcium] and Oxycodone   History: Past Medical History:  Diagnosis Date   AI (aortic insufficiency) 01/11/2018   Trace, noted on ECHO   Arthritis    CAD in native artery    Nuclear stress test 10/19: EF 58, normal perfusion, low risk   Cellulitis    Dermatitis    Diabetes mellitus    type II   Diastolic dysfunction 97/09/6376   Mild, noted on ECHO   Diverticulosis 01/31/2016   ASCENDING COLON AND CECUM, NOTED ON COLONOSCOPY   DOE (dyspnea on exertion)    Edema    Fatty liver 09/18/2004   Noted on [place] ABD   History of kidney stones 05/23/2002  Noted on CT Abd   Hx of colonic polyps 01/31/2016   Hyperlipidemia    Hypertension    Internal hemorrhoids 01/31/2016   Noted on colonoscopy   Obesity    OSA (obstructive sleep apnea)    cpap   Positional vertigo    Pulmonary hypertension ([place]) 01/11/2018   Mild, noted on ECHO   Rhinosinusitis    Skin lesion    TR (tricuspid regurgitation) 01/11/2018   Trace, noted on ECHO   Past Surgical History:  Procedure Laterality Date   CARDIAC CATHETERIZATION     COLONOSCOPY     CORONARY  STENT PLACEMENT     3 stents /1997   POLYPECTOMY     TONSILLECTOMY     TOTAL KNEE ARTHROPLASTY Right 10/04/2018   Procedure: RIGHT TOTAL KNEE ARTHROPLASTY;  Surgeon: Gaynelle Arabian, MD;  Location: [facility];  Service: Orthopedics;  Laterality: Right;  Adductor Block   Family History  Problem Relation Age of Onset   Cancer Mother        ? lung cancer   Heart disease Father    Heart attack Father    Heart disease Sister    Diabetes Sister    Heart attack Sister    Heart attack Brother    Social History   Socioeconomic History   Marital status: Married    Spouse name: Not on file   Number of children: Not on file   Years of education: Not on file   Highest education level: Not on file  Occupational History   Occupation: OWNER, Health and safety inspector: [employer]  Tobacco Use   Smoking status: Former    Packs/day: 1.00    Years: 20.00    Pack years: 20.00    Types: Cigarettes    Quit date: 01/10/1996    Years since quitting: 25.0   Smokeless tobacco: Never  Vaping Use   Vaping Use: Never used  Substance and Sexual Activity   Alcohol use: No   Drug use: No   Sexual activity: Not on file  Other Topics Concern   Not on file  Social History Narrative   Grew up in [place]. Mother was [place], Father [place].   He works daily - owns a [employer]    Married for 30+ years   Has a daughter who lives in [place] Strain: High Risk   Difficulty of Paying Living Expenses: Hard  Food Insecurity: Not on file  Transportation Needs: No Transportation Needs   Lack of Transportation (Medical): No   Lack of Transportation (Non-Medical): No  Physical Activity: Not on file  Stress: Not on file  Social Connections: Not on file    Tobacco Counseling Counseling given: Not Answered   Clinical Intake:                 Diabetic?yes Nutrition Risk Assessment:  Has the patient  had any N/V/D within the last 2 months?  No  Does the patient have any non-healing wounds?  No  Has the patient had any unintentional weight loss or weight gain?  No   Diabetes:  Is the patient diabetic?  Yes  If diabetic, was a CBG obtained today?  No  Did the patient bring in their glucometer from home?  No  How often do you monitor your CBG's? daily.   Financial Strains and Diabetes Management:  Are you having any financial strains with the  device, your supplies or your medication? No .  Does the patient want to be seen by Chronic Care Management for management of their diabetes?  No  Would the patient like to be referred to a Nutritionist or for Diabetic Management?  No   Diabetic Exams:  Diabetic Eye Exam: Overdue for diabetic eye exam. Pt has been advised about the importance in completing this exam. Patient advised to call and schedule an eye exam. Diabetic Foot Exam: Overdue, Pt has been advised about the importance in completing this exam. Pt is scheduled for diabetic foot exam on next office visit .          Activities of Daily Living No flowsheet data found.  Patient Care Team: [name], NP as PCP - General (Family Medicine) Constance Haw, MD as PCP - Cardiology (Cardiology) Ceasar Mons, MD as Consulting Physician (Urology) Gaynelle Arabian, MD as Consulting Physician (Orthopedic Surgery) Viona Gilmore, [facility] as Pharmacist (Pharmacist)  Indicate any recent Medical Services you may have received from other than [facility] providers in the past year (date may be approximate).     Assessment:   This is a routine wellness examination for [place].  Hearing/Vision screen No results found.  Dietary issues and exercise activities discussed:     Goals Addressed   None    Depression Screen PHQ 2/9 Scores 11/28/2020 10/04/2019 12/29/2014 12/16/2013  PHQ - 2 Score 0 0 0 0    Fall Risk Fall Risk  11/28/2020 05/30/2020 05/30/2020 10/04/2019 07/30/2017   Falls in the past year? 1 - - - No  Comment - - - - Emmi Telephone Survey: data to providers prior to load  Number falls in past yr: 1 0 0 0 -  Injury with Fall? 0 1 0 0 -    FALL RISK PREVENTION PERTAINING TO THE HOME:  Any stairs in or around the home? No  If so, are there any without handrails? No  Home free of loose throw rugs in walkways, pet beds, electrical cords, etc? Yes  Adequate lighting in your home to reduce risk of falls? Yes   ASSISTIVE DEVICES UTILIZED TO PREVENT FALLS:  Life alert? No  Use of a cane, walker or w/c? Yes  Grab bars in the bathroom? Yes  Shower chair or bench in shower? Yes  Elevated toilet seat or a handicapped toilet? Yes     Cognitive Function:  Normal cognitive status assessed by direct observation by this Nurse Health Advisor. No abnormalities found.        Immunizations Immunization History  Administered Date(s) Administered   Fluad Quad(high Dose 65+) 05/30/2020   Influenza Split 06/22/2012   Influenza Whole 05/23/2008, 08/03/2009, 06/04/2010   Influenza, High Dose Seasonal PF 07/13/2013   Influenza,inj,Quad PF,6+ Mos 06/04/2016, 07/13/2018, 06/28/2019   Influenza,inj,quad, With Preservative 05/25/2017   PFIZER(Purple Top)SARS-COV-2 Vaccination 10/30/2019, 11/20/2019, 05/25/2020   Pneumococcal Conjugate-13 06/04/2016   Pneumococcal Polysaccharide-23 05/23/2008, 10/04/2019   Varicella Zoster Immune Globulin 06/22/2012    TDAP status: Up to date  Flu Vaccine status: Up to date  Pneumococcal vaccine status: Up to date  Covid-19 vaccine status: Completed vaccines  Qualifies for Shingles Vaccine? Yes   Zostavax completed No   Shingrix Completed?: No.    Education has been provided regarding the importance of this vaccine. Patient has been advised to call insurance company to determine out of pocket expense if they have not yet received this vaccine. Advised may also receive vaccine at local pharmacy or Health Dept.  Verbalized  acceptance and understanding.  Screening Tests Health Maintenance  Topic Date Due   Hepatitis C Screening  Never done   TETANUS/TDAP  Never done   Zoster Vaccines- Shingrix (1 of 2) Never done   FOOT EXAM  05/03/2015   URINE MICROALBUMIN  09/04/2016   OPHTHALMOLOGY EXAM  12/25/2018   COLONOSCOPY (Pts 45-40yrs Insurance coverage will need to be confirmed)  01/31/2019   COVID-19 Vaccine (4 - Booster for Pfizer series) 09/25/2020   INFLUENZA VACCINE  03/25/2021   HEMOGLOBIN A1C  05/30/2021   PNA vac Low Risk Adult  Completed   HPV VACCINES  Aged Out    Health Maintenance  Health Maintenance Due  Topic Date Due   Hepatitis C Screening  Never done   TETANUS/TDAP  Never done   Zoster Vaccines- Shingrix (1 of 2) Never done   FOOT EXAM  05/03/2015   URINE MICROALBUMIN  09/04/2016   OPHTHALMOLOGY EXAM  12/25/2018   COLONOSCOPY (Pts 45-83yrs Insurance coverage will need to be confirmed)  01/31/2019   COVID-19 Vaccine (4 - Booster for [place] series) 09/25/2020    Colorectal cancer screening: No longer required.   Lung Cancer Screening: (Low Dose CT Chest recommended if Age 46-80 years, 30 pack-year currently smoking OR have quit w/in 15years.) does not qualify.   Lung Cancer Screening Referral: n/a  Additional Screening:  Hepatitis C Screening: does not qualify  Vision Screening: Recommended annual ophthalmology exams for early detection of glaucoma and other disorders of the eye. Is the patient up to date with their annual eye exam?  No  Who is the provider or what is the name of the office in which the patient attends annual eye exams? Referral completed 02/04/2021 If pt is not established with a provider, would they like to be referred to a provider to establish care? No .   Dental Screening: Recommended annual dental exams for proper oral hygiene  Community Resource Referral / Chronic Care Management: CRR required this visit?  No   CCM required this visit?  No       Plan:     I have personally reviewed and noted the following in the patient's chart:   Medical and social history Use of alcohol, tobacco or illicit drugs  Current medications and supplements including opioid prescriptions. Patient is not currently taking opioid prescriptions. Functional ability and status Nutritional status Physical activity Advanced directives List of other physicians Hospitalizations, surgeries, and ER visits in previous 12 months Vitals Screenings to include cognitive, depression, and falls Referrals and appointments  In addition, I have reviewed and discussed with patient certain preventive protocols, quality metrics, and best practice recommendations. A written personalized care plan for preventive services as well as general preventive health recommendations were provided to patient.     Randel Pigg, LPN   09/28/5595   Nurse Notes: none

## 2021-02-06 NOTE — Telephone Encounter (Cosign Needed)
Called multiple numbers and lvm. 2nd attempt.

## 2021-02-11 ENCOUNTER — Other Ambulatory Visit: Payer: Self-pay | Admitting: Adult Health

## 2021-02-11 DIAGNOSIS — H8113 Benign paroxysmal vertigo, bilateral: Secondary | ICD-10-CM

## 2021-02-12 ENCOUNTER — Other Ambulatory Visit: Payer: Self-pay | Admitting: Adult Health

## 2021-02-12 ENCOUNTER — Telehealth: Payer: Self-pay | Admitting: Pharmacist

## 2021-02-12 DIAGNOSIS — H8113 Benign paroxysmal vertigo, bilateral: Secondary | ICD-10-CM

## 2021-02-12 NOTE — Chronic Care Management (AMB) (Signed)
    Chronic Care Management Pharmacy Assistant   Name: Jacob Bishop  MRN: [id number] DOB: [birth year]  [facility] Per [facility] pharmacy request to follow up on medication refills requests for medications for meclizine, doxycycline and fluticasone. Per Joycelyn Schmid at front desk patients PCP is out of office and there is someone covering his in basket but it can take 3-5 days to respond to request. Made [facility] pharmacy aware. Message sent to Jeni Salles clinical pharmacist.   [place]  Clinical Pharmacist Assistant [phone]

## 2021-03-06 DIAGNOSIS — L249 Irritant contact dermatitis, unspecified cause: Secondary | ICD-10-CM | POA: Diagnosis not present

## 2021-03-06 DIAGNOSIS — D485 Neoplasm of uncertain behavior of skin: Secondary | ICD-10-CM | POA: Diagnosis not present

## 2021-03-07 ENCOUNTER — Telehealth: Payer: Self-pay | Admitting: Pharmacist

## 2021-03-07 NOTE — Chronic Care Management (AMB) (Signed)
Chronic Care Management Pharmacy Assistant   Name: Jacob Bishop  MRN: [id number] DOB: 11/07/1940  Reason for Encounter: Medication Review/ Medication Coordination Call.     Recent office visits:  None.  Recent consult visits:  None.   Hospital visits:  None in previous 6 months  Medications: Outpatient Encounter Medications as of 03/07/2021  Medication Sig   albuterol (PROVENTIL HFA;VENTOLIN HFA) 108 (90 Base) MCG/ACT inhaler Inhale 2 puffs into the lungs every 6 (six) hours as needed for wheezing or shortness of breath.   fluticasone (FLONASE) 50 MCG/ACT nasal spray Place 2 sprays into both nostrils daily as needed.   Insulin Glargine (BASAGLAR KWIKPEN) 100 UNIT/ML INJECT 0.4 MLS (40 UNITS TOTAL) INTO THE SKIN AT BEDTIME.   Insulin Pen Needle (BD PEN NEEDLE NANO U/F) 32G X 4 MM MISC USE AS DIRECTED TWICE A DAY   ketoconazole (NIZORAL) 2 % cream Apply 1 application topically daily. Apply twice daily until rash has resolved   omeprazole (PRILOSEC) 20 MG capsule TAKE 1 CAPSULE BY MOUTH EVERY DAY (Patient not taking: Reported on 02/04/2021)   oxybutynin (DITROPAN-XL) 10 MG 24 hr tablet Take 10 mg by mouth daily.   simvastatin (ZOCOR) 20 MG tablet Take 1 tablet (20 mg total) by mouth daily at 6 PM.   tamsulosin (FLOMAX) 0.4 MG CAPS capsule Take 1 capsule by mouth 2 (two) times daily.   No facility-administered encounter medications on file as of 03/07/2021.   Reviewed chart for medication changes ahead of medication coordination call.  No OVs, Consults, or hospital visits since last care coordination call/Pharmacist visit. (If appropriate, list visit date, provider name)  No medication changes indicated OR if recent visit, treatment plan here.  BP Readings from Last 3 Encounters:  11/28/20 122/70  05/30/20 117/78  10/21/19 126/70    Lab Results  Component Value Date   HGBA1C 6.3 11/28/2020     Patient obtains medications through Vials  30 Days   Last adherence delivery  included: Simvastatin (ZOCOR) 20 mg: one tablet at bedtime Tamsulosin 0.4 mg: one capsule in the morning and one capsule at bedtime Meclizine 12.5 mg tablet: one tablet 3 times daily as needed (vials) Oxybutynin 10 mg XL: one tablet in the morning Flonase nasal spray: two sprays in each nostril daily as needed (PRN) Doxycycline 100mg  - take 1 tablet at breakfast and 1 tablet with evening meal.  Patient declined medications below due to additional supply on hand and receiving trulicity through patient assistance. Insulin Glargine (BASAGLAR KWIKPEN) 100 UNIT/ML: inject 0.4 mls into skin at bedtime (supply on hand) Trulicity inject 7.67 mg weekly (working on patient assistance)    Patient is due for next adherence delivery on: 03/14/21. Called patient and reviewed medications and coordinated delivery.  This delivery to include: Simvastatin (ZOCOR) 20 mg: one tablet daily with evening meal. Tamsulosin 0.4 mg: one capsule in the morning with breakfast and one capsule at bedtime Oxybutynin 10 mg XL: one tablet in the morning with breakfast.   Patient declined the following medications due to additional supply on hand and receiving trulicity through patient assistance. Insulin Glargine (BASAGLAR KWIKPEN) 100 UNIT/ML: inject 0.4 mls into skin at bedtime (supply on hand) Trulicity inject 3.41 mg weekly (working on patient assistance)  Meclizine 12.5 mg tablet: one tablet 3 times daily as needed (vials) Flonase nasal spray: two sprays in each nostril daily as needed (PRN) Doxycycline 100mg  - take 1 tablet at breakfast and 1 tablet with evening meal.  Notes: Patient  requested vials moving forward. Rescheduled patients upcoming appointment this month to 04/15/21 at 1pm over the phone per Jeni Salles instructions  Confirmed delivery date of 03/14/21, advised patient that pharmacy will contact them the morning of delivery.   Care Gaps:  AWV- completed on 02/04/21.  Hepatitis Cscreening - never  done. Tetanus/TDAP - never done. Zoster vaccines [facility]) - never done. Urine Microalbumin - overdue since 09/04/2016 Colonoscopy - overdue since 09/01/2018 Covid-19 vaccine- booster 4 overdue since 09/25/20   Star Rating Drugs:  Simvastatin 20mg  - last filled on 02/11/21 30DS at [place] Pharmacist Assistant [phone]

## 2021-03-19 ENCOUNTER — Telehealth: Payer: Medicare Other

## 2021-03-26 DIAGNOSIS — L820 Inflamed seborrheic keratosis: Secondary | ICD-10-CM | POA: Diagnosis not present

## 2021-03-26 DIAGNOSIS — I83813 Varicose veins of bilateral lower extremities with pain: Secondary | ICD-10-CM | POA: Diagnosis not present

## 2021-03-26 DIAGNOSIS — L308 Other specified dermatitis: Secondary | ICD-10-CM | POA: Diagnosis not present

## 2021-04-03 ENCOUNTER — Telehealth: Payer: Self-pay | Admitting: Pharmacist

## 2021-04-03 NOTE — Chronic Care Management (AMB) (Addendum)
Chronic Care Management Pharmacy Assistant   Name: Jacob Bishop  MRN: [id number] DOB: 1941-04-10   Reason for Encounter: Medication Review/ Medication Coordination Call.    Recent office visits:  None.   Recent consult visits:  None.   Hospital visits:  None in previous 6 months  Medications: Outpatient Encounter Medications as of 04/03/2021  Medication Sig   albuterol (PROVENTIL HFA;VENTOLIN HFA) 108 (90 Base) MCG/ACT inhaler Inhale 2 puffs into the lungs every 6 (six) hours as needed for wheezing or shortness of breath.   fluticasone (FLONASE) 50 MCG/ACT nasal spray Place 2 sprays into both nostrils daily as needed.   Insulin Glargine (BASAGLAR KWIKPEN) 100 UNIT/ML INJECT 0.4 MLS (40 UNITS TOTAL) INTO THE SKIN AT BEDTIME.   Insulin Pen Needle (BD PEN NEEDLE NANO U/F) 32G X 4 MM MISC USE AS DIRECTED TWICE A DAY   ketoconazole (NIZORAL) 2 % cream Apply 1 application topically daily. Apply twice daily until rash has resolved   omeprazole (PRILOSEC) 20 MG capsule TAKE 1 CAPSULE BY MOUTH EVERY DAY (Patient not taking: Reported on 02/04/2021)   oxybutynin (DITROPAN-XL) 10 MG 24 hr tablet Take 10 mg by mouth daily.   simvastatin (ZOCOR) 20 MG tablet Take 1 tablet (20 mg total) by mouth daily at 6 PM.   tamsulosin (FLOMAX) 0.4 MG CAPS capsule Take 1 capsule by mouth 2 (two) times daily.   No facility-administered encounter medications on file as of 04/03/2021.   Fill History: FLUOCINONIDE 0.05% OIN 03/31/2021 30   fluticasone propionate 50 mcg/actuation nasal spray,suspension 10/29/2020 30   Basaglar KwikPen U-100 Insulin 100 unit/mL (3 mL) subcutaneous 09/25/2020 30   meclizine 12.5 mg tablet 01/08/2021 30   oxybutynin chloride ER 10 mg tablet,extended release 24 hr 03/07/2021 30   simvastatin 20 mg tablet 03/07/2021 30   tamsulosin 0.4 mg capsule 03/07/2021 30   PREDNISONE 20 MG TABLET 03/07/2021 7   Reviewed chart for medication changes ahead of medication coordination  call.  No OVs, Consults, or hospital visits since last care coordination call/Pharmacist visit. (If appropriate, list visit date, provider name)  No medication changes indicated OR if recent visit, treatment plan here.  BP Readings from Last 3 Encounters:  11/28/20 122/70  05/30/20 117/78  10/21/19 126/70    Lab Results  Component Value Date   HGBA1C 6.3 11/28/2020     Patient obtains medications through Vials  30 Days   Last adherence delivery included:  Simvastatin (ZOCOR) 20 mg: one tablet daily with evening meal. Tamsulosin 0.4 mg: one capsule in the morning with breakfast and one capsule at bedtime Oxybutynin 10 mg XL: one tablet in the morning with breakfast.    Patient declined medications below due to additional supply on hand and he receives his Trulicity through patient assistance. Insulin Glargine (BASAGLAR KWIKPEN) 100 UNIT/ML: inject 0.4 mls into skin at bedtime (supply on hand) Trulicity inject [id number] mg weekly (working on patient assistance)  Meclizine 12.5 mg tablet: one tablet 3 times daily as needed (vials) Flonase nasal spray: two sprays in each nostril daily as needed (PRN) Doxycycline '100mg'$  - take 1 tablet at breakfast and 1 tablet with evening meal.  Patient is due for next adherence delivery on: 04/12/21. Called patient and reviewed medications and coordinated delivery.  This delivery to include: Simvastatin (ZOCOR) 20 mg: one tablet daily with evening meal. Tamsulosin 0.4 mg: one capsule in the morning with breakfast and one capsule at bedtime Oxybutynin 10 mg XL: one tablet in the morning  with breakfast.  Patient declined the following medications due to receiving his Trulicity through patient assistance and additional supply on hand.  Trulicity inject [id number] mg weekly (working on patient assistance)  Meclizine 12.5 mg tablet: one tablet 3 times daily as needed (vials) Flonase nasal spray: two sprays in each nostril daily as needed (PRN) Doxycycline '100mg'$   - take 1 tablet at breakfast and 1 tablet with evening meal  Confirmed delivery date of 04/12/21, advised patient that pharmacy will contact them the morning of delivery.   Notes: Patient no longer takes Trulicity due to stomach issues from side effects. Patient still Only wants Vials for his medications. 04/08/21 patient is still getting his basaglar from the manufacturer through patient assistance.  Care Gaps:  AWV - completed 02/04/21 Hepatitis Cscreening - never done. Tetanus/TDAP - never done. Zoster vaccines [facility]) - never done. Urine Microalbumin - overdue since 09/04/2016 Colonoscopy - overdue since 09/01/2018 Covid-19 vaccine- booster 4 overdue since 09/25/20    Star Rating Drugs:  Simvastatin '20mg'$  - last filled on 03/07/21 30DS at [place] Pharmacist Assistant [phone]

## 2021-04-12 ENCOUNTER — Telehealth: Payer: Self-pay | Admitting: Pharmacist

## 2021-04-12 NOTE — Chronic Care Management (AMB) (Signed)
    Chronic Care Management Pharmacy Assistant   Name: Jacob Bishop  MRN: [id number] DOB: 10/05/1940  04/12/21-  Called patient to remind of appointment with Jeni Salles) on (04/15/21 at 1pm via telephone.)   No answer, left message of appointment date, time and type of appointment (either telephone or in person). Left message to have all medications, supplements, blood pressure and/or blood sugar logs available during appointment and to return call if need to reschedule.  Care Gaps:  AWV - completed 02/04/21 Hepatitis Cscreening - never done. Tetanus/TDAP - never done. Zoster vaccines [facility]) - never done. Urine Microalbumin - overdue since 09/04/2016 Colonoscopy - overdue since 09/01/2018 Covid-19 vaccine- booster 4 overdue since 09/25/20  Star Rating Drug:  Simvastatin '20mg'$  - last filled on 04/09/21 30DS at [facility].   Any gaps in medications fill history? No.  [name]  Clinical Pharmacist Assistant [phone]

## 2021-04-15 ENCOUNTER — Ambulatory Visit (INDEPENDENT_AMBULATORY_CARE_PROVIDER_SITE_OTHER): Payer: Medicare Other | Admitting: Pharmacist

## 2021-04-15 DIAGNOSIS — E1165 Type 2 diabetes mellitus with hyperglycemia: Secondary | ICD-10-CM

## 2021-04-15 DIAGNOSIS — H8113 Benign paroxysmal vertigo, bilateral: Secondary | ICD-10-CM

## 2021-04-15 NOTE — Progress Notes (Signed)
Chronic Care Management Pharmacy Note  04/15/2021 Name:  YSMAEL HIRES MRN:  [id number] DOB:  May 15, 1941  Summary: A1c at goal < 7% but patient is not routinely checking blood sugars at home Pt stopped Trulicity on his own due to side effects LDL not at goal < 70  Recommendations/Changes made from today's visit: -Recommended every other day monitoring of blood sugars at home -Consider switching to high intensity rosuvastatin for further LDL lowering and triglyceride lowering  Plan: Cancel PAP for Trulicity as patient stopped taking Follow up DM assessment with monthly call  Subjective: SAVAS ELVIN is an 80 y.o. year old male who is a primary patient of [name], NP.  The CCM team was consulted for assistance with disease management and care coordination needs.    Engaged with patient by telephone for follow up visit in response to provider referral for pharmacy case management and/or care coordination services.   Consent to Services:  The patient was given information about Chronic Care Management services, agreed to services, and gave verbal consent prior to initiation of services.  Please see initial visit note for detailed documentation.   Patient Care Team: [name], NP as PCP - General (Family Medicine) Constance Haw, MD as PCP - Cardiology (Cardiology) Ceasar Mons, MD as Consulting Physician (Urology) Gaynelle Arabian, MD as Consulting Physician (Orthopedic Surgery) Viona Gilmore, [facility] as Pharmacist (Pharmacist)  Recent office visits: 02/04/21 Randel Pigg, LPN: Patient presented for AWV.  11/28/20 [name], NP: Patient presented for annual exam. Plan to switch Victoza to Trulicity and apply for PAP.  10/30/20 [name], NP: Patient presented for nasal congestion. Prescribed doxycycline.   Recent consult visits: 11/08/19 Patient presented for dermatology visit.  Hospital visits: None in previous 6 months  Objective:  Lab  Results  Component Value Date   CREATININE 0.83 11/28/2020   BUN 14 11/28/2020   GFR 84.53 11/28/2020   GFRNONAA >60 10/06/2018   GFRAA >60 10/06/2018   NA 142 11/28/2020   K 4.6 11/28/2020   CALCIUM 9.1 11/28/2020   CO2 30 11/28/2020   GLUCOSE 123 (H) 11/28/2020    Lab Results  Component Value Date/Time   HGBA1C 6.3 11/28/2020 10:10 AM   HGBA1C 6.3 (H) 05/30/2020 10:19 AM   HGBA1C 6.0 07/13/2018 03:19 PM   GFR 84.53 11/28/2020 10:10 AM   GFR 77.99 10/04/2019 09:10 AM   MICROALBUR 4.0 (H) 09/05/2015 05:21 PM   MICROALBUR 1.5 07/13/2013 04:55 PM    Last diabetic Eye exam:  Lab Results  Component Value Date/Time   HMDIABEYEEXA No Retinopathy 12/24/2017 09:29 AM    Last diabetic Foot exam:  Lab Results  Component Value Date/Time   HMDIABFOOTEX yes 05/23/2009 12:00 AM     Lab Results  Component Value Date   CHOL 149 11/28/2020   HDL 38.90 (L) 11/28/2020   LDLCALC 78 11/28/2020   LDLDIRECT 86.0 09/05/2015   TRIG 163.0 (H) 11/28/2020   CHOLHDL 4 11/28/2020    Hepatic Function Latest Ref Rng & Units 11/28/2020 10/04/2019 09/27/2018  Total Protein 6.0 - 8.3 g/dL 6.7 6.9 7.2  Albumin 3.5 - 5.2 g/dL 4.1 4.1 4.1  AST 0 - 37 U/L 29 35 35  ALT 0 - 53 U/L 31 41 48(H)  Alk Phosphatase 39 - 117 U/L 96 98 60  Total Bilirubin 0.2 - 1.2 mg/dL 1.1 1.0 1.1  Bilirubin, Direct 0.0 - 0.3 mg/dL - - -    Lab Results  Component Value Date/Time  TSH 2.18 11/28/2020 10:10 AM   TSH 2.18 10/04/2019 09:10 AM    CBC Latest Ref Rng & Units 11/28/2020 10/04/2019 07/14/2019  WBC 4.0 - 10.5 K/uL 3.8(L) 5.8 7.1  Hemoglobin 13.0 - 17.0 g/dL 13.9 14.5 14.5  Hematocrit 39.0 - 52.0 % 40.4 43.1 42.6  Platelets 150.0 - 400.0 K/uL 82.0(L) 95.0(L) 118.0(L)    No results found for: VD25OH  Clinical ASCVD: Yes  The ASCVD Risk score Mikey Bussing DC Jr., et al., 2013) failed to calculate for the following reasons:   The systolic blood pressure is missing    Depression screen [facility] 2/9 02/04/2021 02/04/2021  11/28/2020  Decreased Interest 0 0 0  Down, Depressed, Hopeless 0 0 0  PHQ - 2 Score 0 0 0  Some recent data might be hidden      Social History   Tobacco Use  Smoking Status Former   Packs/day: 1.00   Years: 20.00   Pack years: 20.00   Types: Cigarettes   Quit date: 01/10/1996   Years since quitting: 25.2  Smokeless Tobacco Never   BP Readings from Last 3 Encounters:  11/28/20 122/70  05/30/20 117/78  10/21/19 126/70   Pulse Readings from Last 3 Encounters:  11/28/20 68  05/30/20 65  10/04/19 (!) 57   Wt Readings from Last 3 Encounters:  11/28/20 269 lb (122 kg)  05/30/20 266 lb 9.6 oz (120.9 kg)  10/21/19 260 lb (117.9 kg)   BMI Readings from Last 3 Encounters:  11/28/20 37.52 kg/m  05/30/20 37.18 kg/m  10/21/19 37.84 kg/m    Assessment/Interventions: Review of patient past medical history, allergies, medications, health status, including review of consultants reports, laboratory and other test data, was performed as part of comprehensive evaluation and provision of chronic care management services.   SDOH:  (Social Determinants of Health) assessments and interventions performed: No   CCM Care Plan  Allergies  Allergen Reactions   Lipitor [Atorvastatin Calcium] Other (See Comments)    Muscle soreness   Oxycodone Anxiety    Causes patient to become shaky and dizzy. Unable to tolerate.    Medications Reviewed Today     Reviewed by Randel Pigg, LPN (Licensed Practical Nurse) on 02/04/21 at 1353  Med List Status: <None>   Medication Order Taking? Sig Documenting Provider Last Dose Status Informant  albuterol (PROVENTIL HFA;VENTOLIN HFA) 108 (90 Base) MCG/ACT inhaler [id number] Yes Inhale 2 puffs into the lungs every 6 (six) hours as needed for wheezing or shortness of breath. Nafziger, Tommi Rumps, NP Taking Active Self  fluticasone (FLONASE) 50 MCG/ACT nasal spray [id number] Yes Place 2 sprays into both nostrils daily as needed. Nafziger, Tommi Rumps, NP Taking Active    Insulin Glargine [facility] KWIKPEN) 100 UNIT/ML [id number] Yes INJECT 0.4 MLS (40 UNITS TOTAL) INTO THE SKIN AT BEDTIME. [name], NP Taking Active   Insulin Pen Needle (BD PEN NEEDLE NANO U/F) 32G X 4 MM MISC [id number] Yes USE AS DIRECTED TWICE A DAY Nafziger, Tommi Rumps, NP Taking Active   ketoconazole (NIZORAL) 2 % cream [id number] Yes Apply 1 application topically daily. Apply twice daily until rash has resolved Nafziger, Tommi Rumps, NP Taking Active   meclizine (ANTIVERT) 12.5 MG tablet [id number] Yes Take 1 tablet (12.5 mg total) by mouth 3 (three) times daily as needed for dizziness. Nafziger, Tommi Rumps, NP Taking Active   omeprazole (PRILOSEC) 20 MG capsule [id number] No TAKE 1 CAPSULE BY MOUTH EVERY DAY  Patient not taking: Reported on 02/04/2021   [name], NP Not Taking Active  oxybutynin (DITROPAN-XL) 10 MG 24 hr tablet [id number] Yes Take 10 mg by mouth daily. Ceasar Mons, MD Taking Active   simvastatin (ZOCOR) 20 MG tablet [id number] Yes Take 1 tablet (20 mg total) by mouth daily at 6 PM. [name], NP Taking Active   tamsulosin (FLOMAX) 0.4 MG CAPS capsule [id number] Yes Take 1 capsule by mouth 2 (two) times daily. Ceasar Mons, MD Taking Active             Patient Active Problem List   Diagnosis Date Noted   OA (osteoarthritis) of knee 10/04/2018   Other secondary pulmonary hypertension ([place]) 04/06/2018   Osteoarthritis, hand 05/05/2014   Carpal tunnel syndrome 05/02/2014   Eczema 01/23/2014   Thrombocytopenia, unspecified ([place]) 11/19/2012   Overactive bladder 08/24/2012   Osteoarthritis of right knee 01/22/2012   Benign positional vertigo 07/09/2011   CAD, NATIVE VESSEL 10/22/2009   EDEMA 08/03/2009   Obstructive sleep apnea 05/03/2009   SKIN LESION 09/23/2007   Diabetes mellitus type 2, uncontrolled ([place]) 03/23/2007   Hyperlipidemia 03/23/2007   Essential hypertension 03/23/2007   COLONIC POLYPS, HX OF 03/23/2007   Morbid obesity ([place])  03/23/2007    Immunization History  Administered Date(s) Administered   Fluad Quad(high Dose 65+) 05/30/2020   Influenza Split 06/22/2012   Influenza Whole 05/23/2008, 08/03/2009, 06/04/2010   Influenza, High Dose Seasonal PF 07/13/2013   Influenza,inj,Quad PF,6+ Mos 06/04/2016, 07/13/2018, 06/28/2019   Influenza,inj,quad, With Preservative 05/25/2017   PFIZER(Purple Top)SARS-COV-2 Vaccination 10/30/2019, 11/20/2019, 05/25/2020   Pneumococcal Conjugate-13 06/04/2016   Pneumococcal Polysaccharide-23 05/23/2008, 10/04/2019   Varicella Zoster Immune Globulin 06/22/2012   Patient stopped Trulicity 2-3 weeks ago because it gave him some diarrhea, just as the Victoza did. He only checks blood sugars once a week but they are ranging in the 160-170s range. He wasn't eating well 5-6 months ago without much of an appetite especially for breakfast but has been eating more lately. He loves to eat tomato sandwiches and recommended adding protein to sandwich. Recommended checking blood sugars every other day.  Conditions to be addressed/monitored:  Hypertension, Hyperlipidemia, Diabetes, Coronary Artery Disease and Overactive Bladder  Conditions addressed this visit:  Diabetes, hyperlipidemia  Care Plan : CCM Pharmacy Care Plan  Updates made by Viona Gilmore, [place] since 04/15/2021 12:00 AM     Problem: Problem: Hypertension, Hyperlipidemia, Diabetes, Coronary Artery Disease and Overactive Bladder      Long-Range Goal: Patient-Specific Goal   Start Date: 11/14/2020  Expected End Date: 11/14/2021  Recent Progress: On track  Priority: High  Note:   Current Barriers:  Unable to independently monitor therapeutic efficacy Unable to self administer medications as prescribed Does not adhere to prescribed medication regimen  Pharmacist Clinical Goal(s):  Patient will achieve adherence to monitoring guidelines and medication adherence to achieve therapeutic efficacy adhere to plan to optimize  therapeutic regimen for diabetes as evidenced by report of adherence to recommended medication management changes through collaboration with PharmD and provider.   Interventions: 1:1 collaboration with [name], NP regarding development and update of comprehensive plan of care as evidenced by provider attestation and co-signature Inter-disciplinary care team collaboration (see longitudinal plan of care) Comprehensive medication review performed; medication list updated in electronic medical record  Hypertension (BP goal <130/80) -Controlled -Current treatment: No medications -Medications previously tried: amlodipine  -Current home readings: does not check at home -Current dietary habits: has lot weight; eating more meat than anything -Current exercise habits: not exercising a whole lot -Denies hypotensive/hypertensive symptoms -Educated  on Exercise goal of 150 minutes per week; Importance of home blood pressure monitoring; -Counseled to monitor BP at home when symptomatic, document, and provide log at future appointments -Counseled on diet and exercise extensively  Hyperlipidemia: (LDL goal < 70) -Controlled -Current treatment: Simvastatin 20 mg daily at bedtime -Medications previously tried: none -Current dietary patterns: he has cut back on portion sizes -Current exercise habits: not exercising a whole lot -Educated on Cholesterol goals;  Benefits of statin for ASCVD risk reduction; Importance of limiting foods high in cholesterol; Exercise goal of 150 minutes per week; -Recommended to continue current medication Consider switching to rosuvastatin for further LDL lowering and superior triglyceride lowering.  Diabetes (A1c goal <7%) -Controlled -Current medications: Basaglar inject 40 units into the skin at bedtime Glipizide XL 10 mg 1 tablet daily -Medications previously tried: Victoza, Trulicity (diarrhea) -Current home glucose readings fasting glucose: only  checking once a week (150-160s) post prandial glucose: n/a -Reports hypoglycemic/hyperglycemic symptoms (once a month) -Current meal patterns:  breakfast: n/a  lunch: n/a dinner: n/a snacks: n/a drinks: n/a -Current exercise: not exercising a whole lot -Educated on Exercise goal of 150 minutes per week; Benefits of routine self-monitoring of blood sugar; Carbohydrate counting and/or plate method -Counseled to check feet daily and get yearly eye exams -Counseled on diet and exercise extensively Recommended to continue current medication  BPH/overactive bladder (Goal: minimize symptoms of enlarged prostate) -Controlled -Current treatment  Oxybutynin ER 10 mg 1 tablet daily Tamsulosin 0.4 mg 1 capsule twice daily -Medications previously tried: Toviaz (switched to Oxybutynin) -Recommended to continue current medication  Allergic rhinitis (Goal: minimize symptoms) -Controlled -Current treatment  Albuterol HFA (Allergy, mostly in Spring)  Flonase 2 sprays daily as needed -Medications previously tried: none  -Recommended to continue current medication  Health Maintenance -Vaccine gaps: shingles, tetanus -Current therapy:  Meclizine 12.5 mg 1 tablet as needed for vertigo -Educated on Cost vs benefit of each product must be carefully weighed by individual consumer -Patient is satisfied with current therapy and denies issues -Recommended to continue current medication  Patient Goals/Self-Care Activities Patient will:  - take medications as prescribed check glucose daily, document, and provide at future appointments check blood pressure weekly, document, and provide at future appointments target a minimum of 150 minutes of moderate intensity exercise weekly  Follow Up Plan: Telephone follow up appointment with care management team member scheduled for: 6 months        Medication Assistance:  Basaglar obtained through [employer] medication assistance program.  Enrollment  ends 08/24/21  Compliance/Adherence/Medication fill history: Care Gaps: Shingrix, COVID booster, tetanus, influenza, Hep C screening, foot exam, urine microalbumin, colonoscopy  Star-Rating Drugs: Simvastatin 75m - last filled on 04/09/21 30DS at [facility]   Patient's preferred pharmacy is:  [facility] [place] [address] [id number]: [phone]: [phone] [id number] [place] [address] [place]one: [phone]: [phone] pill box? No - requested packaging for next delivery Pt endorses 95% compliance  We discussed: Benefits of medication synchronization, packaging and delivery as well as enhanced pharmacist oversight with [facility]. Patient decided to: Utilize [facility] pharmacy for medication synchronization, packaging and delivery  Care Plan and Follow Up Patient Decision:  Patient agrees to Care Plan and Follow-up.  Plan: Telephone follow up appointment with care management team member scheduled for:  6 months  MaJeni SallesPharmD BCACP Clinical  Probation officer at [place]

## 2021-04-30 ENCOUNTER — Telehealth: Payer: Self-pay | Admitting: Pharmacist

## 2021-04-30 NOTE — Chronic Care Management (AMB) (Signed)
    Chronic Care Management Pharmacy Assistant   Name: Jacob Bishop  MRN: [id number] DOB: [birth year]  [name] [facility] patient assistance and RX Outreach to cancel patients assistance for Trulicity due to patient having adverse affects such as diarrhea, nausea and stomach pain. Patient has not taken medication in a while and wished to be unenrolled and future shipments canceled. I Spoke with Yvetta Coder at [employer] and canceled future shipments and reported adverse event information. I then spoke with Clare Gandy at [facility] and canceled assistance for Trulicity only. Patient will continue receiving assistance for [employer].   Medications: Outpatient Encounter Medications as of 04/30/2021  Medication Sig   albuterol (PROVENTIL HFA;VENTOLIN HFA) 108 (90 Base) MCG/ACT inhaler Inhale 2 puffs into the lungs every 6 (six) hours as needed for wheezing or shortness of breath.   fluticasone (FLONASE) 50 MCG/ACT nasal spray Place 2 sprays into both nostrils daily as needed.   Insulin Glargine (BASAGLAR KWIKPEN) 100 UNIT/ML INJECT 0.4 MLS (40 UNITS TOTAL) INTO THE SKIN AT BEDTIME.   Insulin Pen Needle (BD PEN NEEDLE NANO U/F) 32G X 4 MM MISC USE AS DIRECTED TWICE A DAY   ketoconazole (NIZORAL) 2 % cream Apply 1 application topically daily. Apply twice daily until rash has resolved   omeprazole (PRILOSEC) 20 MG capsule TAKE 1 CAPSULE BY MOUTH EVERY DAY (Patient not taking: Reported on 02/04/2021)   oxybutynin (DITROPAN-XL) 10 MG 24 hr tablet Take 10 mg by mouth daily.   simvastatin (ZOCOR) 20 MG tablet Take 1 tablet (20 mg total) by mouth daily at 6 PM.   tamsulosin (FLOMAX) 0.4 MG CAPS capsule Take 1 capsule by mouth 2 (two) times daily.   No facility-administered encounter medications on file as of 04/30/2021.    Care Gaps:  AWV - completed 02/04/21 Hepatitis Cscreening - never done. Tetanus/TDAP - never done. Zoster vaccines [facility]) - never done. Urine Microalbumin - overdue since  09/04/2016 Colonoscopy - overdue since 09/01/2018 Covid-19 vaccine- booster 4 overdue since 09/25/20  Star Rating Drugs:  Simvastatin '20mg'$  - last filled on 04/09/21 30DS at [facility] [phone]

## 2021-05-06 ENCOUNTER — Telehealth: Payer: Self-pay | Admitting: Pharmacist

## 2021-05-06 NOTE — Chronic Care Management (AMB) (Signed)
Chronic Care Management Pharmacy Assistant   Name: Jacob Bishop  MRN: [id number] DOB: May 17, 1941   Reason for Encounter: Disease State and Medication Review   Conditions to be addressed/monitored: DMII   Recent office visits:  None.  Recent consult visits:  None.  Hospital visits:  None in previous 6 months  Medications: Outpatient Encounter Medications as of 05/06/2021  Medication Sig   albuterol (PROVENTIL HFA;VENTOLIN HFA) 108 (90 Base) MCG/ACT inhaler Inhale 2 puffs into the lungs every 6 (six) hours as needed for wheezing or shortness of breath.   fluticasone (FLONASE) 50 MCG/ACT nasal spray Place 2 sprays into both nostrils daily as needed.   Insulin Glargine (BASAGLAR KWIKPEN) 100 UNIT/ML INJECT 0.4 MLS (40 UNITS TOTAL) INTO THE SKIN AT BEDTIME.   Insulin Pen Needle (BD PEN NEEDLE NANO U/F) 32G X 4 MM MISC USE AS DIRECTED TWICE A DAY   ketoconazole (NIZORAL) 2 % cream Apply 1 application topically daily. Apply twice daily until rash has resolved   omeprazole (PRILOSEC) 20 MG capsule TAKE 1 CAPSULE BY MOUTH EVERY DAY (Patient not taking: Reported on 02/04/2021)   oxybutynin (DITROPAN-XL) 10 MG 24 hr tablet Take 10 mg by mouth daily.   simvastatin (ZOCOR) 20 MG tablet Take 1 tablet (20 mg total) by mouth daily at 6 PM.   tamsulosin (FLOMAX) 0.4 MG CAPS capsule Take 1 capsule by mouth 2 (two) times daily.   No facility-administered encounter medications on file as of 05/06/2021.   Fill History: FLUOCINONIDE 0.05% OINTMENT 03/31/2021 30   fluticasone propionate 50 mcg/actuation nasal spray,suspension 10/29/2020 30   Basaglar KwikPen U-100 Insulin 100 unit/mL (3 mL) subcutaneous 09/25/2020 30   oxybutynin chloride ER 10 mg tablet,extended release 24 hr 04/09/2021 30   simvastatin 20 mg tablet 04/09/2021 30   tamsulosin 0.4 mg capsule 04/09/2021 90   Recent Relevant Labs: Lab Results  Component Value Date/Time   HGBA1C 6.3 11/28/2020 10:10 AM   HGBA1C 6.3 (H)  05/30/2020 10:19 AM   HGBA1C 6.0 07/13/2018 03:19 PM   MICROALBUR 4.0 (H) 09/05/2015 05:21 PM   MICROALBUR 1.5 07/13/2013 04:55 PM    Kidney Function Lab Results  Component Value Date/Time   CREATININE 0.83 11/28/2020 10:10 AM   CREATININE 0.87 05/30/2020 10:19 AM   CREATININE 0.94 10/04/2019 09:10 AM   GFR 84.53 11/28/2020 10:10 AM   GFRNONAA >60 10/06/2018 05:21 AM   GFRAA >60 10/06/2018 05:21 AM    Current antihyperglycemic regimen:  Basaglar kwikpens - 40 units at bedtime  What recent interventions/DTPs have been made to improve glycemic control:  Trulicity was stopped due to GI upset Have there been any recent hospitalizations or ED visits since last visit with CPP? No Patient denies hypoglycemic symptoms, including None Patient reports hyperglycemic symptoms, including excessive thirst and polyuria How often are you checking your blood sugar? Every other day What are your blood sugars ranging?  After meals: numbers are running around 180-200. During the week, how often does your blood glucose drop below 70? Never Are you checking your feet daily/regularly? Yes. Patient checks feet everyday.   Adherence Review: Is the patient currently on a STATIN medication? Yes Is the patient currently on ACE/ARB medication? No Does the patient have >5 day gap between last estimated fill dates? No  Reviewed chart for medication changes ahead of medication coordination call.  No OVs, Consults, or hospital visits since last care coordination call/Pharmacist visit. (If appropriate, list visit date, provider name)  No medication changes indicated  OR if recent visit, treatment plan here.  BP Readings from Last 3 Encounters:  11/28/20 122/70  05/30/20 117/78  10/21/19 126/70    Lab Results  Component Value Date   HGBA1C 6.3 11/28/2020     Patient obtains medications through V[facility]  30 Days   Last adherence delivery included:  Simvastatin (ZOCOR) 20 mg: one tablet daily with  evening meal. Tamsulosin 0.4 mg: one capsule in the morning with breakfast and one capsule at bedtime Oxybutynin 10 mg XL: one tablet in the morning with breakfast.  Patient declined medications below. Patient receives patient assistance for basaglar Jacob Bishop and he is no longer on trulicity. Trulicity inject [id number] mg weekly (working on patient assistance)  Meclizine 12.5 mg tablet: one tablet 3 times daily as needed (vials) Flonase nasal spray: two sprays in each nostril daily as needed (PRN) Doxycycline '100mg'$  - take 1 tablet at breakfast and 1 tablet with evening meal  Patient is due for next adherence delivery on: 05/13/21. Called patient and reviewed medications and coordinated delivery.  This delivery to include: Simvastatin (ZOCOR) 20 mg: one tablet daily with evening meal. Tamsulosin 0.4 mg: one capsule in the morning with breakfast and one capsule at bedtime Oxybutynin 10 mg XL: one tablet in the morning with breakfast.  Patient declined medications below. Patient receives patient assistance for basaglar Jacob Bishop and he is no longer on trulicity. Trulicity inject [id number] mg weekly (working on patient assistance)  Meclizine 12.5 mg tablet: one tablet 3 times daily as needed (vials) Flonase nasal spray: two sprays in each nostril daily as needed (PRN) Doxycycline '100mg'$  - take 1 tablet at breakfast and 1 tablet with evening meal  Confirmed delivery date of 05/13/21, advised patient that pharmacy will contact them the morning of delivery.   Care Gaps:  AWV - completed 02/04/21 Hepatitis Cscreening - never done. Tetanus/TDAP - never done. Zoster vaccines [facility]) - never done. Urine Microalbumin - overdue since 09/04/2016 Colonoscopy - overdue since 09/01/2018 Covid-19 vaccine- booster 4 overdue since 09/25/20  Star Rating Drugs:  Simvastatin '20mg'$  - last filled on 04/09/21 30DS at [place] [phone]

## 2021-05-31 ENCOUNTER — Telehealth: Payer: Self-pay | Admitting: Pharmacist

## 2021-05-31 NOTE — Chronic Care Management (AMB) (Addendum)
Chronic Care Management Pharmacy Assistant   Name: Jacob Bishop  MRN: [id number] DOB: 1941/06/12  Reason for Encounter: Medication Review/ Medication Coordination Call.     Recent office visits:  None.  Recent consult visits:  None.  Hospital visits:  None in previous 6 months  Medications: Outpatient Encounter Medications as of 05/31/2021  Medication Sig   albuterol (PROVENTIL HFA;VENTOLIN HFA) 108 (90 Base) MCG/ACT inhaler Inhale 2 puffs into the lungs every 6 (six) hours as needed for wheezing or shortness of breath.   fluticasone (FLONASE) 50 MCG/ACT nasal spray Place 2 sprays into both nostrils daily as needed.   Insulin Glargine (BASAGLAR KWIKPEN) 100 UNIT/ML INJECT 0.4 MLS (40 UNITS TOTAL) INTO THE SKIN AT BEDTIME.   Insulin Pen Needle (BD PEN NEEDLE NANO U/F) 32G X 4 MM MISC USE AS DIRECTED TWICE A DAY   ketoconazole (NIZORAL) 2 % cream Apply 1 application topically daily. Apply twice daily until rash has resolved   omeprazole (PRILOSEC) 20 MG capsule TAKE 1 CAPSULE BY MOUTH EVERY DAY (Patient not taking: Reported on 02/04/2021)   oxybutynin (DITROPAN-XL) 10 MG 24 hr tablet Take 10 mg by mouth daily.   simvastatin (ZOCOR) 20 MG tablet Take 1 tablet (20 mg total) by mouth daily at 6 PM.   tamsulosin (FLOMAX) 0.4 MG CAPS capsule Take 1 capsule by mouth 2 (two) times daily.   No facility-administered encounter medications on file as of 05/31/2021.   Fill History: FLUOCINONIDE 0.05% OINTMENT 03/31/2021 30   fluticasone propionate 50 mcg/actuation nasal spray,suspension 10/29/2020 30   meclizine 12.5 mg tablet 01/08/2021 30   simvastatin 20 mg tablet 05/08/2021 30   tamsulosin 0.4 mg capsule 05/08/2021 30    BP Readings from Last 3 Encounters:  11/28/20 122/70  05/30/20 117/78  10/21/19 126/70    Lab Results  Component Value Date   HGBA1C 6.3 11/28/2020     Patient obtains medications through Vials  30 Days   Last adherence delivery included:  Simvastatin  (ZOCOR) 20 mg: one tablet daily with evening meal. Tamsulosin 0.4 mg: one capsule in the morning with breakfast and one capsule at bedtime Oxybutynin 10 mg XL: one tablet in the morning with breakfast.  Patient declined medications below: Agricultural engineer (patient assistance)  Meclizine 12.5 mg tablet: one tablet 3 times daily as needed (vials) Flonase nasal spray: two sprays in each nostril daily as needed (PRN) Doxycycline 100mg  - take 1 tablet at breakfast and 1 tablet with evening meal  Patient is due for next adherence delivery on: 06/12/21. Called patient and reviewed medications and coordinated delivery.  This delivery to include: Simvastatin (ZOCOR) 20 mg: one tablet daily with evening meal. Tamsulosin 0.4 mg: one capsule in the morning with breakfast and one capsule at bedtime Oxybutynin 10 mg XL: one tablet in the morning with breakfast.  Patient declined medications below: Agricultural engineer (patient assistance)  Meclizine 12.5 mg tablet: one tablet 3 times daily as needed (vials) Flonase nasal spray: two sprays in each nostril daily as needed (PRN) Doxycycline 100mg  - take 1 tablet at breakfast and 1 tablet with evening meal   Confirmed delivery date of 06/12/21, advised patient that pharmacy will contact them the morning of delivery.   Care Gaps:  AWV - completed 02/04/21 Hepatitis Cscreening - never done. Tetanus/TDAP - never done. Zoster vaccines [facility]) - never done. Urine Microalbumin - overdue since 09/04/2016 Colonoscopy - overdue since 09/01/2018 Covid-19 vaccine- booster 4 overdue since 09/25/20  Star Rating Drugs:  Simvastatin 20mg  - last filled on 05/08/21 30DS at [facility] [phone]

## 2021-06-05 DIAGNOSIS — R338 Other retention of urine: Secondary | ICD-10-CM | POA: Diagnosis not present

## 2021-06-05 DIAGNOSIS — N139 Obstructive and reflux uropathy, unspecified: Secondary | ICD-10-CM | POA: Diagnosis not present

## 2021-06-13 ENCOUNTER — Telehealth: Payer: Self-pay

## 2021-06-13 NOTE — Telephone Encounter (Signed)
Received a telephone advise record for pt c/o of chest pain and lower back pains. Called pt to see if he seek help at local UC or ED as advised by the triage nurse. Per pt, pt did not go. Pt stated that he took some Advil and laid down. Pt stated that he would like to see the doctor in a couple of days. Pt has been scheduled for OV tomorrow.

## 2021-06-14 ENCOUNTER — Other Ambulatory Visit: Payer: Self-pay

## 2021-06-14 ENCOUNTER — Telehealth: Payer: Self-pay | Admitting: Adult Health

## 2021-06-14 ENCOUNTER — Encounter: Payer: Self-pay | Admitting: Adult Health

## 2021-06-14 ENCOUNTER — Ambulatory Visit (INDEPENDENT_AMBULATORY_CARE_PROVIDER_SITE_OTHER): Payer: Medicare Other

## 2021-06-14 ENCOUNTER — Ambulatory Visit (INDEPENDENT_AMBULATORY_CARE_PROVIDER_SITE_OTHER): Payer: Medicare Other | Admitting: Adult Health

## 2021-06-14 VITALS — BP 140/60 | HR 73 | Temp 98.4°F | Ht 71.0 in | Wt 256.0 lb

## 2021-06-14 DIAGNOSIS — R079 Chest pain, unspecified: Secondary | ICD-10-CM | POA: Diagnosis not present

## 2021-06-14 DIAGNOSIS — R0602 Shortness of breath: Secondary | ICD-10-CM

## 2021-06-14 DIAGNOSIS — J9811 Atelectasis: Secondary | ICD-10-CM | POA: Diagnosis not present

## 2021-06-14 DIAGNOSIS — R0789 Other chest pain: Secondary | ICD-10-CM

## 2021-06-14 DIAGNOSIS — F321 Major depressive disorder, single episode, moderate: Secondary | ICD-10-CM

## 2021-06-14 DIAGNOSIS — J189 Pneumonia, unspecified organism: Secondary | ICD-10-CM

## 2021-06-14 DIAGNOSIS — E1165 Type 2 diabetes mellitus with hyperglycemia: Secondary | ICD-10-CM

## 2021-06-14 MED ORDER — LEVOFLOXACIN 750 MG PO TABS: 750.0000 mg | ORAL_TABLET | Freq: Every day | ORAL | 0 refills | Status: DC

## 2021-06-14 MED ORDER — BUPROPION HCL ER (XL) 150 MG PO TB24: 150.0000 mg | ORAL_TABLET | Freq: Every day | ORAL | 0 refills | Status: DC

## 2021-06-14 MED ORDER — ALBUTEROL SULFATE HFA 108 (90 BASE) MCG/ACT IN AERS: 2.0000 | INHALATION_SPRAY | Freq: Four times a day (QID) | RESPIRATORY_TRACT | 0 refills | Status: DC | PRN

## 2021-06-14 NOTE — Telephone Encounter (Signed)
Spoke to patient informed him of his chest x-ray results which showed    IMPRESSION: Chronic bibasilar atelectasis/scarring with low lung volumes.   Interval development of mild bibasilar airspace disease, possible pneumonia.    Treat with Levaquin 750 x 7 days and albuterol inhaler.

## 2021-06-14 NOTE — Patient Instructions (Signed)
I am going to prescribe you wellbutrin   We will follow up with you regarding your labs work and chest xray   I will see you back in one month or sooner if needed

## 2021-06-14 NOTE — Progress Notes (Signed)
Subjective:    Patient ID: Jacob Bishop, male    DOB: Jun 05, 1941, 80 y.o.   MRN: [id number]  HPI 80 year old male who  has a past medical history of AI (aortic insufficiency) (01/11/2018), Arthritis, CAD in native artery, Cellulitis, Dermatitis, Diabetes mellitus, Diastolic dysfunction (59/56/3875), Diverticulosis (01/31/2016), DOE (dyspnea on exertion), Edema, Fatty liver (09/18/2004), History of kidney stones (05/23/2002), colonic polyps (01/31/2016), Hyperlipidemia, Hypertension, Internal hemorrhoids (01/31/2016), Obesity, OSA (obstructive sleep apnea), Positional vertigo, Pulmonary hypertension ([place]) (01/11/2018), Rhinosinusitis, Skin lesion, and TR (tricuspid regurgitation) (01/11/2018).  He presents to the office today with his wife for multiple issues   DM -currently prescribed Basaglar 40 units at bedtime. Does not believe he is any longer taking glipizide.   In the past he has tried Victoza and Trulicity, most recently stopped Trulicity on his own due to diarrhea.  He has only been checking his blood sugars once a week and they have been running in the 180-200 range.  His diet has been poor and he leads a sedentary lifestyle Lab Results  Component Value Date   HGBA1C 6.3 11/28/2020   Depression -has been an ongoing issue for at least the last year, he has just never brought it up.  He often feels depressed and does not feel like he is enjoying things that he once did.  He stays in the house often and does not move from his chair.  His wife reports that this week he did not move from his chair from Monday through Thursday.  He denies suicidal ideation.  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  Chest Tightness-is been present for about a week.  He reports shortness of breath, nonproductive cough, and intermittent wheezing.  He does have some upper chest tightness but no pain.  Denies fevers or chills.  Feels as though it is  hard for him to get a full breath and when he does deep breathing causes the tightness to become worse.  Review of Systems See HPI   Past Medical History:  Diagnosis Date   AI (aortic insufficiency) 01/11/2018   Trace, noted on ECHO   Arthritis    CAD in native artery    Nuclear stress test 10/19: EF 58, normal perfusion, low risk   Cellulitis    Dermatitis    Diabetes mellitus    type II   Diastolic dysfunction 64/33/2951   Mild, noted on ECHO   Diverticulosis 01/31/2016   ASCENDING COLON AND CECUM, NOTED ON COLONOSCOPY   DOE (dyspnea on exertion)    Edema    Fatty liver 09/18/2004   Noted on [place] ABD   History of kidney stones 05/23/2002   Noted on CT Abd   Hx of colonic polyps 01/31/2016   Hyperlipidemia    Hypertension    Internal hemorrhoids 01/31/2016   Noted on colonoscopy   Obesity    OSA (obstructive sleep apnea)    cpap   Positional vertigo    Pulmonary hypertension ([place]) 01/11/2018   Mild, noted on ECHO   Rhinosinusitis    Skin lesion    TR (tricuspid regurgitation) 01/11/2018   Trace, noted on ECHO    Social History   Socioeconomic History   Marital status: Married    Spouse name: Not on file   Number of children: Not on file   Years of education: Not on file   Highest education level: Not on file  Occupational History   Occupation: Financial controller, Health and safety inspector: [employer]  Tobacco Use   Smoking status: Former    Packs/day: 1.00    Years: 20.00    Pack years: 20.00    Types: Cigarettes    Quit date: 01/10/1996    Years since quitting: 25.4   Smokeless tobacco: Never  Vaping Use   Vaping Use: Never used  Substance and Sexual Activity   Alcohol use: No   Drug use: No   Sexual activity: Not on file  Other Topics Concern   Not on file  Social History Narrative   Grew up in [place]. Mother was [place], Father [place].   He works daily - owns a [employer]    Married for 30+ years   Has a daughter  who lives in [place] Strain: Not on [employer] Insecurity: No Food Insecurity   Worried About Charity fundraiser in the Last Year: Never true   Arboriculturist in the Last Year: Never true  Transportation Needs: Not on file  Physical Activity: Inactive   Days of Exercise per Week: 0 days   Minutes of Exercise per Session: 0 min  Stress: No Stress Concern Present   Feeling of Stress : Not at all  Social Connections: Moderately Isolated   Frequency of Communication with Friends and Family: Twice a week   Frequency of Social Gatherings with Friends and Family: Twice a week   Attends Religious Services: Never   Marine scientist or Organizations: No   Attends Music therapist: Never   Marital Status: Married  Human resources officer Violence: Not on file    Past Surgical History:  Procedure Laterality Date   CARDIAC CATHETERIZATION     COLONOSCOPY     CORONARY STENT PLACEMENT     3 stents /1997   POLYPECTOMY     TONSILLECTOMY     TOTAL KNEE ARTHROPLASTY Right 10/04/2018   Procedure: RIGHT TOTAL KNEE ARTHROPLASTY;  Surgeon: Gaynelle Arabian, MD;  Location: [facility];  Service: Orthopedics;  Laterality: Right;  Adductor Block    Family History  Problem Relation Age of Onset   Cancer Mother        ? lung cancer   Heart disease Father    Heart attack Father    Heart disease Sister    Diabetes Sister    Heart attack Sister    Heart attack Brother     Allergies  Allergen Reactions   Lipitor [Atorvastatin Calcium] Other (See Comments)    Muscle soreness   Oxycodone Anxiety    Causes patient to become shaky and dizzy. Unable to tolerate.    Current Outpatient Medications on File Prior to Visit  Medication Sig Dispense Refill   albuterol (PROVENTIL HFA;VENTOLIN HFA) 108 (90 Base) MCG/ACT inhaler Inhale 2 puffs into the lungs every 6 (six) hours as needed for wheezing or shortness of breath. 1 Inhaler 0    fluticasone (FLONASE) 50 MCG/ACT nasal spray Place 2 sprays into both nostrils daily as needed. 16 mL 3   Insulin Glargine (BASAGLAR KWIKPEN) 100 UNIT/ML INJECT 0.4 MLS (40 UNITS TOTAL) INTO THE SKIN AT BEDTIME. 15 mL 1   Insulin Pen Needle (BD PEN NEEDLE NANO U/F) 32G X 4 MM MISC USE AS DIRECTED TWICE A DAY 200 each 0   ketoconazole (NIZORAL) 2 % cream Apply 1 application topically daily.  Apply twice daily until rash has resolved 30 g 0   omeprazole (PRILOSEC) 20 MG capsule TAKE 1 CAPSULE BY MOUTH EVERY DAY 90 capsule 0   oxybutynin (DITROPAN-XL) 10 MG 24 hr tablet Take 10 mg by mouth daily.     simvastatin (ZOCOR) 20 MG tablet Take 1 tablet (20 mg total) by mouth daily at 6 PM. 90 tablet 3   tamsulosin (FLOMAX) 0.4 MG CAPS capsule Take 1 capsule by mouth 2 (two) times daily.     No current facility-administered medications on file prior to visit.    BP 140/60   Pulse 73   Temp 98.4 F (36.9 C) (Oral)   Ht 5\' 11"  (1.803 m)   Wt 256 lb (116.1 kg)   SpO2 93%   BMI 35.70 kg/m       Objective:   Physical Exam Vitals and nursing note reviewed.  Constitutional:      Appearance: He is well-developed. He is obese.  Cardiovascular:     Rate and Rhythm: Normal rate and regular rhythm.     Pulses: Normal pulses.     Heart sounds: Normal heart sounds.  Pulmonary:     Effort: Pulmonary effort is normal. No respiratory distress.     Breath sounds: Normal breath sounds. Decreased air movement present. No stridor. No decreased breath sounds, wheezing, rhonchi or rales.  Chest:     Chest wall: No tenderness.  Musculoskeletal:        General: Normal range of motion.  Skin:    General: Skin is warm and dry.  Neurological:     General: No focal deficit present.     Mental Status: He is alert and oriented to person, place, and time.     Motor: Weakness present.     Gait: Gait abnormal.     Comments: Unsteady on feet.  Needed 2 person assist to get up on exam table.  This is due to  bilateral knee pain  Psychiatric:        Mood and Affect: Mood normal.        Behavior: Behavior normal.        Thought Content: Thought content normal.        Judgment: Judgment normal.      Assessment & Plan:  1. Uncontrolled type 2 diabetes mellitus with hyperglycemia ([place]) -We will have him check and see if he is taking glipizide.  Encouraged not to stop medications without notifying me first. - Hemoglobin A1c; Future - Basic Metabolic Panel; Future - Follow up in three months  2. Chest tightness - Will treat depending on Chest xray results  - EKG 12-Lead- Sinus  Rhythm  - frequent PAC s  # PACs = 3. WITHIN NORMAL LIMITS Rate 85. Consistent with previous EKGS - DG Chest 2 View; Future  3. Depression, major, single episode, moderate ([place]) - Will start on wellbutrin. Follow up in one month  Depression screen [facility] 2/9 06/14/2021 02/04/2021 02/04/2021  Decreased Interest 3 0 0  Down, Depressed, Hopeless 3 0 0  PHQ - 2 Score 6 0 0  Altered sleeping 1 - -  Tired, decreased energy 3 - -  Change in appetite 1 - -  Feeling bad or failure about yourself  3 - -  Trouble concentrating 1 - -  Moving slowly or fidgety/restless 0 - -  Suicidal thoughts 0 - -  PHQ-9 Score 15 - -  Difficult doing work/chores Very difficult - -  Some recent data might be  hidden    - buPROPion (WELLBUTRIN XL) 150 MG 24 hr tablet; Take 1 tablet (150 mg total) by mouth daily.  Dispense: 90 tablet; Refill: 0  4. Shortness of breath  - EKG 12-Lead - DG Chest 2 View; Future  [name], NP

## 2021-06-17 ENCOUNTER — Other Ambulatory Visit: Payer: Self-pay | Admitting: Adult Health

## 2021-06-17 DIAGNOSIS — H8113 Benign paroxysmal vertigo, bilateral: Secondary | ICD-10-CM

## 2021-06-24 ENCOUNTER — Emergency Department (HOSPITAL_BASED_OUTPATIENT_CLINIC_OR_DEPARTMENT_OTHER)
Admission: EM | Admit: 2021-06-24 | Discharge: 2021-06-24 | Disposition: A | Discharge status: Home / Self Care | Payer: Medicare Other | Attending: Emergency Medicine | Admitting: Emergency Medicine

## 2021-06-24 ENCOUNTER — Telehealth: Payer: Self-pay | Admitting: Pulmonary Disease

## 2021-06-24 ENCOUNTER — Emergency Department (HOSPITAL_BASED_OUTPATIENT_CLINIC_OR_DEPARTMENT_OTHER): Payer: Medicare Other

## 2021-06-24 ENCOUNTER — Other Ambulatory Visit: Payer: Self-pay

## 2021-06-24 ENCOUNTER — Emergency Department (HOSPITAL_BASED_OUTPATIENT_CLINIC_OR_DEPARTMENT_OTHER): Payer: Medicare Other | Admitting: Radiology

## 2021-06-24 ENCOUNTER — Encounter (HOSPITAL_BASED_OUTPATIENT_CLINIC_OR_DEPARTMENT_OTHER): Payer: Self-pay

## 2021-06-24 DIAGNOSIS — Z96651 Presence of right artificial knee joint: Secondary | ICD-10-CM | POA: Insufficient documentation

## 2021-06-24 DIAGNOSIS — Z79899 Other long term (current) drug therapy: Secondary | ICD-10-CM | POA: Diagnosis not present

## 2021-06-24 DIAGNOSIS — I5030 Unspecified diastolic (congestive) heart failure: Secondary | ICD-10-CM | POA: Insufficient documentation

## 2021-06-24 DIAGNOSIS — Z794 Long term (current) use of insulin: Secondary | ICD-10-CM | POA: Insufficient documentation

## 2021-06-24 DIAGNOSIS — R918 Other nonspecific abnormal finding of lung field: Secondary | ICD-10-CM

## 2021-06-24 DIAGNOSIS — J9601 Acute respiratory failure with hypoxia: Secondary | ICD-10-CM | POA: Insufficient documentation

## 2021-06-24 DIAGNOSIS — E119 Type 2 diabetes mellitus without complications: Secondary | ICD-10-CM | POA: Diagnosis not present

## 2021-06-24 DIAGNOSIS — C7800 Secondary malignant neoplasm of unspecified lung: Secondary | ICD-10-CM | POA: Diagnosis not present

## 2021-06-24 DIAGNOSIS — J90 Pleural effusion, not elsewhere classified: Secondary | ICD-10-CM | POA: Diagnosis not present

## 2021-06-24 DIAGNOSIS — I2510 Atherosclerotic heart disease of native coronary artery without angina pectoris: Secondary | ICD-10-CM | POA: Diagnosis not present

## 2021-06-24 DIAGNOSIS — I110 Hypertensive heart disease with heart failure: Secondary | ICD-10-CM | POA: Insufficient documentation

## 2021-06-24 DIAGNOSIS — I700 Atherosclerosis of aorta: Secondary | ICD-10-CM | POA: Diagnosis not present

## 2021-06-24 DIAGNOSIS — K7460 Unspecified cirrhosis of liver: Secondary | ICD-10-CM | POA: Diagnosis not present

## 2021-06-24 DIAGNOSIS — Z87891 Personal history of nicotine dependence: Secondary | ICD-10-CM | POA: Diagnosis not present

## 2021-06-24 DIAGNOSIS — J9811 Atelectasis: Secondary | ICD-10-CM | POA: Diagnosis not present

## 2021-06-24 DIAGNOSIS — J189 Pneumonia, unspecified organism: Secondary | ICD-10-CM | POA: Diagnosis not present

## 2021-06-24 DIAGNOSIS — R911 Solitary pulmonary nodule: Secondary | ICD-10-CM | POA: Diagnosis not present

## 2021-06-24 DIAGNOSIS — K5730 Diverticulosis of large intestine without perforation or abscess without bleeding: Secondary | ICD-10-CM | POA: Diagnosis not present

## 2021-06-24 DIAGNOSIS — Z20822 Contact with and (suspected) exposure to covid-19: Secondary | ICD-10-CM | POA: Insufficient documentation

## 2021-06-24 DIAGNOSIS — I3139 Other pericardial effusion (noninflammatory): Secondary | ICD-10-CM | POA: Diagnosis not present

## 2021-06-24 DIAGNOSIS — R0602 Shortness of breath: Secondary | ICD-10-CM | POA: Diagnosis present

## 2021-06-24 DIAGNOSIS — I10 Essential (primary) hypertension: Secondary | ICD-10-CM | POA: Diagnosis not present

## 2021-06-24 DIAGNOSIS — N400 Enlarged prostate without lower urinary tract symptoms: Secondary | ICD-10-CM | POA: Diagnosis not present

## 2021-06-24 LAB — CBC WITH DIFFERENTIAL/PLATELET
Abs Immature Granulocytes: 0.03 10*3/uL (ref 0.00–0.07)
Basophils Absolute: 0 10*3/uL (ref 0.0–0.1)
Basophils Relative: 0 %
Eosinophils Absolute: 0 10*3/uL (ref 0.0–0.5)
Eosinophils Relative: 1 %
HCT: 40 % (ref 39.0–52.0)
Hemoglobin: 13.7 g/dL (ref 13.0–17.0)
Immature Granulocytes: 1 %
Lymphocytes Relative: 13 %
Lymphs Abs: 0.8 10*3/uL (ref 0.7–4.0)
MCH: 31.9 pg (ref 26.0–34.0)
MCHC: 34.3 g/dL (ref 30.0–36.0)
MCV: 93.2 fL (ref 80.0–100.0)
Monocytes Absolute: 0.6 10*3/uL (ref 0.1–1.0)
Monocytes Relative: 10 %
Neutro Abs: 4.7 10*3/uL (ref 1.7–7.7)
Neutrophils Relative %: 75 %
Platelets: 100 10*3/uL — ABNORMAL LOW (ref 150–400)
RBC: 4.29 MIL/uL (ref 4.22–5.81)
RDW: 13.2 % (ref 11.5–15.5)
WBC: 6.2 10*3/uL (ref 4.0–10.5)
nRBC: 0 % (ref 0.0–0.2)

## 2021-06-24 LAB — COMPREHENSIVE METABOLIC PANEL
ALT: 20 U/L (ref 0–44)
AST: 22 U/L (ref 15–41)
Albumin: 3.5 g/dL (ref 3.5–5.0)
Alkaline Phosphatase: 147 U/L — ABNORMAL HIGH (ref 38–126)
Anion gap: 10 (ref 5–15)
BUN: 12 mg/dL (ref 8–23)
CO2: 27 mmol/L (ref 22–32)
Calcium: 8.5 mg/dL — ABNORMAL LOW (ref 8.9–10.3)
Chloride: 102 mmol/L (ref 98–111)
Creatinine, Ser: 0.78 mg/dL (ref 0.61–1.24)
GFR, Estimated: >60 mL/min (ref >60)
Glucose, Bld: 138 mg/dL — ABNORMAL HIGH (ref 70–99)
Potassium: 4.2 mmol/L (ref 3.5–5.1)
Sodium: 139 mmol/L (ref 135–145)
Total Bilirubin: 1.4 mg/dL — ABNORMAL HIGH (ref 0.3–1.2)
Total Protein: 6.8 g/dL (ref 6.5–8.1)

## 2021-06-24 LAB — RESP PANEL BY RT-PCR (FLU A&B, COVID) ARPGX2
Influenza A by PCR: NEGATIVE
Influenza B by PCR: NEGATIVE
SARS Coronavirus 2 by RT PCR: NEGATIVE

## 2021-06-24 LAB — D-DIMER, QUANTITATIVE: D-Dimer, Quant: 0.49 ug/mL-FEU (ref 0.00–0.50)

## 2021-06-24 LAB — BRAIN NATRIURETIC PEPTIDE: B Natriuretic Peptide: 39.2 pg/mL (ref 0.0–100.0)

## 2021-06-24 LAB — TROPONIN I (HIGH SENSITIVITY)
Troponin I (High Sensitivity): 7 ng/L (ref <18)
Troponin I (High Sensitivity): 6 ng/L (ref <18)

## 2021-06-24 MED ORDER — IOHEXOL 300 MG/ML  SOLN
75.0000 mL | Freq: Once | INTRAMUSCULAR | Status: AC | PRN
  Administered 2021-06-24: 75 mL via INTRAVENOUS

## 2021-06-24 MED ORDER — IOHEXOL 350 MG/ML SOLN
100.0000 mL | Freq: Once | INTRAVENOUS | Status: AC | PRN
  Administered 2021-06-24: 100 mL via INTRAVENOUS

## 2021-06-24 MED ORDER — ACETAMINOPHEN 500 MG PO TABS
1000.0000 mg | ORAL_TABLET | Freq: Once | ORAL | Status: AC
  Administered 2021-06-24: 1000 mg via ORAL
  Filled 2021-06-24: qty 2

## 2021-06-24 MED ORDER — ALBUTEROL SULFATE HFA 108 (90 BASE) MCG/ACT IN AERS
2.0000 | INHALATION_SPRAY | Freq: Once | RESPIRATORY_TRACT | Status: AC
  Administered 2021-06-24: 2 via RESPIRATORY_TRACT
  Filled 2021-06-24: qty 6.7

## 2021-06-24 NOTE — ED Notes (Signed)
RT Note: Pt. placed on 2 lpm Oxygen via n/c per order.

## 2021-06-24 NOTE — Telephone Encounter (Signed)
Received message from Dr. Regenia Skeeter at [facility] ER.  Pt present with progressive cough and dyspnea.  Found to have exertion hypoxia.  CT chest showed numerous pulmonary nodules concerning for metastatic lesions.  He was previously seen by Dr. [name], and last seen in office in 2020 by Dr. [name].  Please call to schedule him for office visit this week >> visit can be with Tammy Parrett if Dr. [name] not available this week.

## 2021-06-24 NOTE — Discharge Instructions (Signed)
You need to wear the oxygen until cleared by the pulmonologist.

## 2021-06-24 NOTE — ED Provider Notes (Signed)
Signout from Dr. Regenia Skeeter.  80 year old male chest pain and shortness of breath.  Recent pneumonia.  Has a new oxygen requirement.  CT showing multiple pulmonary nodules concerning for metastatic disease.  He is pending oxygen delivery with help from social work and also a CT abdomen and pelvis.  He has outpatient follow-up on discharge paperwork.  If unable to get oxygen she carried he should be admitted to the hospital. Physical Exam  BP (!) 156/74 (BP Location: Right Arm)   Pulse 80   Temp 98.3 F (36.8 C) (Oral)   Resp (!) 23   Ht 5\' 11"  (1.803 m)   Wt 113.4 kg   SpO2 91%   BMI 34.87 kg/m   Physical Exam  ED Course/Procedures     Procedures  MDM  Social work was able to set up patient for home oxygen.  He is comfortable plan for discharge and outpatient follow-up as set up by Dr. Glory Buff, Rebeca Alert, MD 06/25/21 1007

## 2021-06-24 NOTE — ED Notes (Addendum)
RT Note: Pt. ambulated with pulse ox. to CT scan on room air, resting heart rate 79 with pulse ox. of 92%. /Pts. pulse ox. dropped to 87% during ambulation with heart rate up to 103 once reaching CT pulse ox. reading of 84%, upon returning to room sat >'d gradually to 91% with heart rate being down to 84 after ~ 1 minute>, no Oxygen use at home does use CPAP h/s.

## 2021-06-24 NOTE — ED Provider Notes (Signed)
[facility] EMERGENCY DEPT Provider Note   CSN: [id number] Arrival date & time: 06/24/21  1016     History Chief Complaint  Patient presents with   Shortness of [place] is a 80 y.o. male.  HPI 80 year old male presents with chest pain and dyspnea.  10 days ago he was diagnosed with pneumonia via x-ray.  He was started on Levaquin and finished these antibiotics about 3 days ago.  He felt like his symptoms were improving but when he woke up this morning around 5 AM he was having more severe pain.  Instead of it being left-sided chest pain is more right-sided.  Feels like his chest is sore.  Some pain in the back 2.  Worse when he takes a deep breath.  He has been having a cough the whole time and the cough is basically unchanged.  He was given an inhaler and it seemed to partially help.  No leg swelling or history of thromboembolism.  Right now the pain is about an 8 out of 10.  No fevers or vomiting.  Past Medical History:  Diagnosis Date   AI (aortic insufficiency) 01/11/2018   Trace, noted on ECHO   Arthritis    CAD in native artery    Nuclear stress test 10/19: EF 58, normal perfusion, low risk   Cellulitis    Dermatitis    Diabetes mellitus    type II   Diastolic dysfunction 35/00/9381   Mild, noted on ECHO   Diverticulosis 01/31/2016   ASCENDING COLON AND CECUM, NOTED ON COLONOSCOPY   DOE (dyspnea on exertion)    Edema    Fatty liver 09/18/2004   Noted on [place] ABD   History of kidney stones 05/23/2002   Noted on CT Abd   Hx of colonic polyps 01/31/2016   Hyperlipidemia    Hypertension    Internal hemorrhoids 01/31/2016   Noted on colonoscopy   Obesity    OSA (obstructive sleep apnea)    cpap   Positional vertigo    Pulmonary hypertension ([place]) 01/11/2018   Mild, noted on ECHO   Rhinosinusitis    Skin lesion    TR (tricuspid regurgitation) 01/11/2018   Trace, noted on ECHO    Patient Active Problem List   Diagnosis Date Noted    OA (osteoarthritis) of knee 10/04/2018   Other secondary pulmonary hypertension ([place]) 04/06/2018   Osteoarthritis, hand 05/05/2014   Carpal tunnel syndrome 05/02/2014   Eczema 01/23/2014   Thrombocytopenia, unspecified ([place]) 11/19/2012   Overactive bladder 08/24/2012   Osteoarthritis of right knee 01/22/2012   Benign positional vertigo 07/09/2011   CAD, NATIVE VESSEL 10/22/2009   EDEMA 08/03/2009   Obstructive sleep apnea 05/03/2009   SKIN LESION 09/23/2007   Diabetes mellitus type 2, uncontrolled 03/23/2007   Hyperlipidemia 03/23/2007   Essential hypertension 03/23/2007   COLONIC POLYPS, HX OF 03/23/2007   Morbid obesity ([place]) 03/23/2007    Past Surgical History:  Procedure Laterality Date   CARDIAC CATHETERIZATION     COLONOSCOPY     CORONARY STENT PLACEMENT     3 stents /1997   POLYPECTOMY     TONSILLECTOMY     TOTAL KNEE ARTHROPLASTY Right 10/04/2018   Procedure: RIGHT TOTAL KNEE ARTHROPLASTY;  Surgeon: Gaynelle Arabian, MD;  Location: [facility];  Service: Orthopedics;  Laterality: Right;  Adductor Block       Family History  Problem Relation Age of Onset   Cancer Mother        ?  lung cancer   Heart disease Father    Heart attack Father    Heart disease Sister    Diabetes Sister    Heart attack Sister    Heart attack Brother     Social History   Tobacco Use   Smoking status: Former    Packs/day: 1.00    Years: 20.00    Pack years: 20.00    Types: Cigarettes    Quit date: 01/10/1996    Years since quitting: 25.4   Smokeless tobacco: Never  Vaping Use   Vaping Use: Never used  Substance Use Topics   Alcohol use: No   Drug use: No    Home Medications Prior to Admission medications   Medication Sig Start Date End Date Taking? Authorizing Provider  albuterol (VENTOLIN HFA) 108 (90 Base) MCG/ACT inhaler Inhale 2 puffs into the lungs every 6 (six) hours as needed for wheezing or shortness of breath. 06/14/21  Yes Nafziger, Tommi Rumps, NP  buPROPion (WELLBUTRIN XL)  150 MG 24 hr tablet Take 1 tablet (150 mg total) by mouth daily. 06/14/21  Yes Nafziger, Tommi Rumps, NP  fluticasone (FLONASE) 50 MCG/ACT nasal spray Place 2 sprays into both nostrils daily as needed. 11/09/19 12/18/22 Yes Nafziger, Tommi Rumps, NP  Insulin Glargine (BASAGLAR KWIKPEN) 100 UNIT/ML INJECT 0.4 MLS (40 UNITS TOTAL) INTO THE SKIN AT BEDTIME. 09/26/20  Yes Nafziger, Tommi Rumps, NP  Insulin Pen Needle (BD PEN NEEDLE NANO U/F) 32G X 4 MM MISC USE AS DIRECTED TWICE A DAY 05/18/20  Yes Nafziger, Tommi Rumps, NP  levofloxacin (LEVAQUIN) 750 MG tablet Take 1 tablet (750 mg total) by mouth daily. 06/14/21  Yes Nafziger, Tommi Rumps, NP  meclizine (ANTIVERT) 12.5 MG tablet TAKE ONE TABLET BY MOUTH three times daily AS NEEDED FOR dizziness 06/18/21  Yes Nafziger, Tommi Rumps, NP  oxybutynin (DITROPAN-XL) 10 MG 24 hr tablet Take 10 mg by mouth daily. 02/29/20  Yes Ceasar Mons, MD  simvastatin (ZOCOR) 20 MG tablet Take 1 tablet (20 mg total) by mouth daily at 6 PM. 11/30/20  Yes Nafziger, Tommi Rumps, NP  tamsulosin (FLOMAX) 0.4 MG CAPS capsule Take 1 capsule by mouth 2 (two) times daily. 03/07/15  Yes Ceasar Mons, MD  ketoconazole (NIZORAL) 2 % cream Apply 1 application topically daily. Apply twice daily until rash has resolved 09/14/19   Nafziger, Tommi Rumps, NP  omeprazole (PRILOSEC) 20 MG capsule TAKE 1 CAPSULE BY MOUTH EVERY DAY 11/26/18   [name], NP    Allergies    Lipitor [atorvastatin calcium] and Oxycodone  Review of Systems   Review of Systems  Constitutional:  Negative for fever.  Respiratory:  Positive for cough and shortness of breath.   Cardiovascular:  Positive for chest pain. Negative for leg swelling.  Gastrointestinal:  Negative for abdominal pain.  Musculoskeletal:  Positive for back pain.  All other systems reviewed and are negative.  Physical Exam Updated Vital Signs BP (!) 156/74 (BP Location: Right Arm)   Pulse 80   Temp 98.3 F (36.8 C) (Oral)   Resp (!) 23   Ht 5\' 11"  (1.803 m)   Wt 113.4  kg   SpO2 91%   BMI 34.87 kg/m   Physical Exam Vitals and nursing note reviewed.  Constitutional:      General: He is not in acute distress.    Appearance: He is well-developed. He is obese. He is not ill-appearing or diaphoretic.  HENT:     Head: Normocephalic and atraumatic.     Right Ear: External ear normal.  Left Ear: External ear normal.     Nose: Nose normal.  Eyes:     General:        Right eye: No discharge.        Left eye: No discharge.  Cardiovascular:     Rate and Rhythm: Normal rate and regular rhythm.     Heart sounds: Normal heart sounds.  Pulmonary:     Effort: Pulmonary effort is normal.     Breath sounds: Rales (faint, bilateral lower lungs) present.  Chest:     Chest wall: No tenderness.  Abdominal:     Palpations: Abdomen is soft.     Tenderness: There is no abdominal tenderness.  Musculoskeletal:     Cervical back: Neck supple.  Skin:    General: Skin is warm and dry.  Neurological:     Mental Status: He is alert.  Psychiatric:        Mood and Affect: Mood is not anxious.    ED Results / Procedures / Treatments   Labs (all labs ordered are listed, but only abnormal results are displayed) Labs Reviewed  COMPREHENSIVE METABOLIC PANEL - Abnormal; Notable for the following components:      Result Value   Glucose, Bld 138 (*)    Calcium 8.5 (*)    Alkaline Phosphatase 147 (*)    Total Bilirubin 1.4 (*)    All other components within normal limits  CBC WITH DIFFERENTIAL/PLATELET - Abnormal; Notable for the following components:   Platelets 100 (*)    All other components within normal limits  RESP PANEL BY RT-PCR (FLU A&B, COVID) ARPGX2  BRAIN NATRIURETIC PEPTIDE  D-DIMER, QUANTITATIVE  TROPONIN I (HIGH SENSITIVITY)  TROPONIN I (HIGH SENSITIVITY)    EKG EKG Interpretation  Date/Time:  Monday June 24 2021 10:48:36 EDT Ventricular Rate:  85 PR Interval:  160 QRS Duration: 97 QT Interval:  345 QTC Calculation: 411 R  Axis:   25 Text Interpretation: Sinus rhythm Abnormal R-wave progression, early transition Borderline T abnormalities, inferior leads T wave changes anterior leads improved when compared to 2020 Confirmed by Sherwood Gambler [phone]) on 06/24/2021 10:56:34 AM  Radiology DG Chest 2 View  Addendum Date: 06/24/2021   ADDENDUM REPORT: 06/24/2021 11:29 ADDENDUM: When compared to the prior exam, patchy infiltrates seen previously have resolved. Electronically Signed   By: Lavonia Dana M.D.   On: 06/24/2021 11:29   Result Date: 06/24/2021 CLINICAL DATA:  Cough, chest pain, just finished 10 day course of antibiotics for pneumonia EXAM: CHEST - 2 VIEW COMPARISON:  06/14/2021 FINDINGS: Hypoinflated lungs with bibasilar atelectasis. Stable heart size and mediastinal contours. Remaining lungs clear. No pleural effusion or pneumothorax. Osseous demineralization. IMPRESSION: Hypoinflated lungs with bibasilar atelectasis. Electronically Signed: By: Lavonia Dana M.D. On: 06/24/2021 11:15   CT Angio Chest PE W and/or Wo Contrast  Result Date: 06/24/2021 CLINICAL DATA:  PE suspected, low/intermediate prob, positive D-dimer EXAM: CT ANGIOGRAPHY CHEST WITH CONTRAST TECHNIQUE: Multidetector CT imaging of the chest was performed using the standard protocol during bolus administration of intravenous contrast. Multiplanar CT image reconstructions and MIPs were obtained to evaluate the vascular anatomy. CONTRAST:  165mL OMNIPAQUE IOHEXOL 350 MG/ML SOLN COMPARISON:  None. FINDINGS: Cardiovascular: Satisfactory opacification of the pulmonary arteries to the segmental level. No evidence of pulmonary embolism. Coronary artery and aortic atherosclerosis (ICD10-I70.0). The small pericardial effusion. Mediastinum/Nodes: Borderline enlarged right paratracheal and left AP window lymph nodes. Lungs/Pleura: Numerous rounded pulmonary nodules throughout both lungs. Index nodule in the left upper lobe  measures 1.9 cm on series 6, image 23.  Index lesion in the right upper lobe measures 1.4 cm on series 6, image 26. Small left greater than right pleural effusions. No evidence of cavitation. Small left greater than right pleural effusions. No pneumothorax. Upper Abdomen: Ill-defined area of hypoattenuation within the lateral left hepatic lobe (series 4, image 110). Incompletely characterized retropharyngeal lymph nodes. Musculoskeletal: Degenerative changes of the thoracic spine with flowing anterior osteophytes. Lytic or blastic lesions identified. No evidence of acute fracture. Review of the MIP images confirms the above findings. IMPRESSION: 1. No evidence of acute pulmonary embolism. 2. Numerous round pulmonary nodules throughout both lungs, highly concerning for pulmonary metastases. 3. Borderline enlarged right paratracheal and left AP window lymph nodes, indeterminate for nodal metastases. PET CT could provide more definitive nodal staging if clinically indicated. 4. Ill-defined area of hypoattenuation within the lateral left hepatic lobe and incompletely characterized retroperitoneal nodes. Given the above findings, recommend CT abdomen/pelvis with contrast to further characterize and to assess for a primary malignancy. 5. Small left greater than right pleural effusions. Electronically Signed   By: Margaretha Sheffield M.D.   On: 06/24/2021 14:01    Procedures Procedures   Medications Ordered in ED Medications  albuterol (VENTOLIN HFA) 108 (90 Base) MCG/ACT inhaler 2 puff (2 puffs Inhalation Given 06/24/21 1115)  acetaminophen (TYLENOL) tablet 1,000 mg (1,000 mg Oral Given 06/24/21 1408)  iohexol (OMNIPAQUE) 350 MG/ML injection 100 mL (100 mLs Intravenous Contrast Given 06/24/21 1323)    ED Course  I have reviewed the triage vital signs and the nursing notes.  Pertinent labs & imaging results that were available during my care of the patient were reviewed by me and considered in my medical decision making (see chart for details).     MDM Rules/Calculators/A&P                           Chest x-ray does not show any type of pneumonia and actually shows improvement from 10 days ago.  However with his persistent symptoms and hypoxia when ambulating (down to 84%) a CT was ordered.  There is no PE or pneumonia but there are numerous lung nodules consistent with metastasis as well as some pleural effusions.  Unclear if effusions or more likely the nodules are causing the hypoxia. Discussed with Dr. Grandville Silos for admission given new O2 requirement. He asks for pulmonary consult and home O2 from ED. This is difficult usually, but Lucia Bitter the case manager was able to secure him home O2, which I have  ordered. D/w Dr. Halford Chessman, who can see this week as outpatient but agrees on CT abd/pelvis. He should be leaving this evening. He understands this is likely metastatic cancer. Is ok with going home. Care to Dr. Melina Copa with CT and O2 delivery still pending.  Final Clinical Impression(s) / ED Diagnoses Final diagnoses:  Acute respiratory failure with hypoxia [facility])  Pulmonary nodules    Rx / DC Orders ED Discharge Orders     None        Sherwood Gambler, MD 06/24/21 [id number]

## 2021-06-24 NOTE — Care Management (Signed)
ED RN Care Manager  received call from [facility] concerning home oxygen set up.  Patient  SOB  and sats  91-93% at rest  RA but  drops down to 84% on exertion.  2LNC given  and return to baseline.  Patient will need to be sent home on oxygen, ED RNCM contacted [facility]  oxygen supplier who can accept the oxygen referral and will deliver portable tank for discharge home and have the concentrator  delivered to patient's home tonight. Updated EDP and RN at DB/ED.

## 2021-06-24 NOTE — ED Triage Notes (Signed)
Pt arrives POV with his wife.  Reports he was diagnosed with pneumonia approximately 10 days ago.  States he completed antibiotics on Friday.  States short of breath this am with pain in mid-lower back.

## 2021-06-24 NOTE — Telephone Encounter (Signed)
Please call patient to add to Dr. [name]  schedule , if no opening place on my schedule this week to discuss and decide on next step

## 2021-06-24 NOTE — Care Management (Signed)
Discussed with ED RN that patient may benefit from [facility] services. Patient is a new home oxygen.  Recommendation was discussed with patient and patient is agreeable and does not have a preference ED CM will review to see which [facility] is within patient's insurance network and who has the earliest start of care.  [facility] would be able to start care within the next 24--48 hours . Updated patient and EDP with plan. No further ED RNCM needs identified.

## 2021-06-24 NOTE — Discharge Planning (Signed)
SATURATION QUALIFICATIONS: (This note is used to comply with regulatory documentation for home oxygen)  Patient Saturations on Room Air at Rest = 91%  Patient Saturations on Room Air while Ambulating = 84%  Patient Saturations on 2 Liters of oxygen while Ambulating = 94%  Please briefly explain why patient needs home oxygen:

## 2021-06-25 NOTE — Telephone Encounter (Signed)
Pt is scheduled with TP for 06/27/21 at 4:30

## 2021-06-27 ENCOUNTER — Ambulatory Visit (INDEPENDENT_AMBULATORY_CARE_PROVIDER_SITE_OTHER): Payer: Medicare Other | Admitting: Adult Health

## 2021-06-27 ENCOUNTER — Other Ambulatory Visit: Payer: Self-pay

## 2021-06-27 ENCOUNTER — Encounter: Payer: Self-pay | Admitting: Adult Health

## 2021-06-27 DIAGNOSIS — G4733 Obstructive sleep apnea (adult) (pediatric): Secondary | ICD-10-CM

## 2021-06-27 DIAGNOSIS — R918 Other nonspecific abnormal finding of lung field: Secondary | ICD-10-CM | POA: Insufficient documentation

## 2021-06-27 DIAGNOSIS — J9611 Chronic respiratory failure with hypoxia: Secondary | ICD-10-CM | POA: Insufficient documentation

## 2021-06-27 DIAGNOSIS — K769 Liver disease, unspecified: Secondary | ICD-10-CM | POA: Insufficient documentation

## 2021-06-27 DIAGNOSIS — R160 Hepatomegaly, not elsewhere classified: Secondary | ICD-10-CM | POA: Diagnosis not present

## 2021-06-27 NOTE — Addendum Note (Signed)
Addended by: Vanessa Barbara on: 06/27/2021 05:30 PM   Modules accepted: Orders

## 2021-06-27 NOTE — Assessment & Plan Note (Signed)
Multiple scattered lung nodules throughout the lungs suspicious for pulmonary metastasis- Patient be set up for a PET scan. Discussed CT results with patient and wife.  Plan  Patient Instructions  Set up for PET scan in next week.  Continue on Oxygen 2l/m  Albuterol inhaler As needed   Try to Restart CPAP At bedtime  .  Try CPAP dreamwear nasal mask .  Saline nasal gel At bedtime   Follow up with Dr. [name]  in 2 weeks to review results .  Please contact office for sooner follow up if symptoms do not improve or worsen or seek emergency care

## 2021-06-27 NOTE — Patient Instructions (Addendum)
Set up for PET scan in next week.  Continue on Oxygen 2l/m  Albuterol inhaler As needed   Try to Restart CPAP At bedtime  .  Try CPAP dreamwear nasal mask .  Saline nasal gel At bedtime   Follow up with Dr. [name]  in 2 weeks to review results .  Please contact office for sooner follow up if symptoms do not improve or worsen or seek emergency care

## 2021-06-27 NOTE — Assessment & Plan Note (Signed)
Patient has underlying obstructive sleep apnea.  Patient has not been wearing his CPAP.  Patient has been encouraged to restart CPAP.  We will order a DreamWear nasal mask.  Plan  Patient Instructions  Set up for PET scan in next week.  Continue on Oxygen 2l/m  Albuterol inhaler As needed   Try to Restart CPAP At bedtime  .  Try CPAP dreamwear nasal mask .  Saline nasal gel At bedtime   Follow up with Dr. [name]  in 2 weeks to review results .  Please contact office for sooner follow up if symptoms do not improve or worsen or seek emergency care

## 2021-06-27 NOTE — Assessment & Plan Note (Signed)
Recent CT abdomen and pelvis showed multiple pulmonary nodules along with 4.7 cm hepatic lesion and left hepatic lobe lesion.  Concerning for possible metastatic disease.  Several enlarged lymph nodes in the portocaval region. PET scan has been ordered.

## 2021-06-27 NOTE — Progress Notes (Signed)
@Patient  ID: Jacob Bishop, male    DOB: 12-17-1940, 80 y.o.   MRN: [id number]  Chief Complaint  Patient presents with   Follow-up    Referring provider: [name], NP  HPI: 80 year old male former smoker  followed for obstructive sleep apnea  TEST/EVENTS :  2D echo May 2019 EF 60 to 27%, grade 1 diastolic dysfunction, pulmonary artery pressure 41 mmHg.    06/27/2021 Follow up ; OSA  Patient returns for a follow-up visit.  Patient complains that he was diagnosed with pneumonia a couple weeks ago.  Was treated with Levaquin in the emergency room.  And started on oxygen.  Patient had complained of cough, shortness of breath, chest pain, left-sided pleuritic and back pain.  Initial x-ray showed mild bibasilar airspace disease.  Patient continued to have ongoing symptoms and was seen in the emergency room on June 24, 2021 CT chest was negative for PE.  There were numerous round pulmonary nodules throughout both lungs highly concerning for pulmonary metastasis.  Enlarged right peritracheal and left AP window lymph nodes.  Ill-defined areas on left hepatic lobe.  CT abdomen and pelvis showed hepatic cirrhosis with a 4.7 cm low-density nodule concerning for metastatic lesion.  An ill-defined density in the left hepatic lobe.  And several enlarged lymph nodes in the portocaval region.  And severe prostate enlargement. No weight loss. No hemoptysis. No fever . Minimal dry cough. No previous cancer diagnosis.  FH of lung cancer and prostate cancer .  Patient returns today feeling slightly better. Wearing oxygen which helps with dyspnea.    Patient has OSA says he has not been wearing CPAP. Has tried multiple masks but just can not wear his CPAP mask. We discussed trying dreamwear nasal    Allergies  Allergen Reactions   Lipitor [Atorvastatin Calcium] Other (See Comments)    Muscle soreness   Oxycodone Anxiety    Causes patient to become shaky and dizzy. Unable to tolerate.     Immunization History  Administered Date(s) Administered   Fluad Quad(high Dose 65+) 05/30/2020   Influenza Split 06/22/2012   Influenza Whole 05/23/2008, 08/03/2009, 06/04/2010   Influenza, High Dose Seasonal PF 07/13/2013   Influenza,inj,Quad PF,6+ Mos 06/04/2016, 07/13/2018, 06/28/2019   Influenza,inj,quad, With Preservative 05/25/2017   PFIZER(Purple Top)SARS-COV-2 Vaccination 10/30/2019, 11/20/2019, 05/25/2020   Pneumococcal Conjugate-13 06/04/2016   Pneumococcal Polysaccharide-23 05/23/2008, 10/04/2019   Varicella Zoster Immune Globulin 06/22/2012    Past Medical History:  Diagnosis Date   AI (aortic insufficiency) 01/11/2018   Trace, noted on ECHO   Arthritis    CAD in native artery    Nuclear stress test 10/19: EF 58, normal perfusion, low risk   Cellulitis    Dermatitis    Diabetes mellitus    type II   Diastolic dysfunction 02/15/7627   Mild, noted on ECHO   Diverticulosis 01/31/2016   ASCENDING COLON AND CECUM, NOTED ON COLONOSCOPY   DOE (dyspnea on exertion)    Edema    Fatty liver 09/18/2004   Noted on [place] ABD   History of kidney stones 05/23/2002   Noted on CT Abd   Hx of colonic polyps 01/31/2016   Hyperlipidemia    Hypertension    Internal hemorrhoids 01/31/2016   Noted on colonoscopy   Obesity    OSA (obstructive sleep apnea)    cpap   Positional vertigo    Pulmonary hypertension ([place]) 01/11/2018   Mild, noted on ECHO   Rhinosinusitis    Skin lesion  TR (tricuspid regurgitation) 01/11/2018   Trace, noted on ECHO    Tobacco History: Social History   Tobacco Use  Smoking Status Former   Packs/day: 1.00   Years: 20.00   Pack years: 20.00   Types: Cigarettes   Quit date: 01/10/1996   Years since quitting: 25.4  Smokeless Tobacco Never   Counseling given: Not Answered   Outpatient Medications Prior to Visit  Medication Sig Dispense Refill   albuterol (VENTOLIN HFA) 108 (90 Base) MCG/ACT inhaler Inhale 2 puffs into the lungs every  6 (six) hours as needed for wheezing or shortness of breath. 1 each 0   buPROPion (WELLBUTRIN XL) 150 MG 24 hr tablet Take 1 tablet (150 mg total) by mouth daily. 90 tablet 0   fluticasone (FLONASE) 50 MCG/ACT nasal spray Place 2 sprays into both nostrils daily as needed. 16 mL 3   Insulin Glargine (BASAGLAR KWIKPEN) 100 UNIT/ML INJECT 0.4 MLS (40 UNITS TOTAL) INTO THE SKIN AT BEDTIME. 15 mL 1   Insulin Pen Needle (BD PEN NEEDLE NANO U/F) 32G X 4 MM MISC USE AS DIRECTED TWICE A DAY 200 each 0   ketoconazole (NIZORAL) 2 % cream Apply 1 application topically daily. Apply twice daily until rash has resolved 30 g 0   levofloxacin (LEVAQUIN) 750 MG tablet Take 1 tablet (750 mg total) by mouth daily. 7 tablet 0   meclizine (ANTIVERT) 12.5 MG tablet TAKE ONE TABLET BY MOUTH three times daily AS NEEDED FOR dizziness 90 tablet 0   omeprazole (PRILOSEC) 20 MG capsule TAKE 1 CAPSULE BY MOUTH EVERY DAY 90 capsule 0   oxybutynin (DITROPAN-XL) 10 MG 24 hr tablet Take 10 mg by mouth daily.     simvastatin (ZOCOR) 20 MG tablet Take 1 tablet (20 mg total) by mouth daily at 6 PM. 90 tablet 3   tamsulosin (FLOMAX) 0.4 MG CAPS capsule Take 1 capsule by mouth 2 (two) times daily.     No facility-administered medications prior to visit.     Review of Systems:   Constitutional:   No  weight loss, night sweats,  Fevers, chills,  +fatigue, or  lassitude.  HEENT:   No headaches,  Difficulty swallowing,  Tooth/dental problems, or  Sore throat,                No sneezing, itching, ear ache, nasal congestion, post nasal drip,   CV:  No chest pain,  Orthopnea, PND, swelling in lower extremities, anasarca, dizziness, palpitations, syncope.   GI  No heartburn, indigestion, abdominal pain, nausea, vomiting, diarrhea, change in bowel habits, loss of appetite, bloody stools.   Resp:   No chest wall deformity  Skin: no rash or lesions.  GU: no dysuria, change in color of urine, no urgency or frequency.  No flank pain,  no hematuria   MS:  No joint pain or swelling.  No decreased range of motion.  No back pain.    Physical Exam  BP (!) 108/58 (BP Location: Left Arm, Patient Position: Sitting, Cuff Size: Normal)   Pulse 99   Temp 98.3 F (36.8 C) (Oral)   Ht 5\' 11"  (1.803 m)   Wt 255 lb 6.4 oz (115.8 kg)   SpO2 96%   BMI 35.62 kg/m   GEN: A/Ox3; pleasant , NAD, well nourished    HEENT:  [place]/AT,  EACs-clear, TMs-wnl, NOSE-clear, THROAT-clear, no lesions, no postnasal drip or exudate noted.   NECK:  Supple w/ fair ROM; no JVD; normal carotid impulses w/o bruits;  no thyromegaly or nodules palpated; no lymphadenopathy.    RESP  Clear  P & A; w/o, wheezes/ rales/ or rhonchi. no accessory muscle use, no dullness to percussion  CARD:  RRR, no m/r/g, 1+ peripheral edema, pulses intact, no cyanosis or clubbing.  GI:   Soft & nt; nml bowel sounds; no organomegaly or masses detected.   Musco: Warm bil, no deformities or joint swelling noted.   Neuro: alert, no focal deficits noted.    Skin: Warm, no lesions or rashes    Lab Results:  CBC    Component Value Date/Time   WBC 6.2 06/24/2021 1125   RBC 4.29 06/24/2021 1125   HGB 13.7 06/24/2021 1125   HCT 40.0 06/24/2021 1125   PLT 100 (L) 06/24/2021 1125   MCV 93.2 06/24/2021 1125   MCH 31.9 06/24/2021 1125   MCHC 34.3 06/24/2021 1125   RDW 13.2 06/24/2021 1125   LYMPHSABS 0.8 06/24/2021 1125   MONOABS 0.6 06/24/2021 1125   EOSABS 0.0 06/24/2021 1125   BASOSABS 0.0 06/24/2021 1125    BMET    Component Value Date/Time   NA 139 06/24/2021 1125   NA 145 (H) 05/24/2018 1618   K 4.2 06/24/2021 1125   CL 102 06/24/2021 1125   CO2 27 06/24/2021 1125   GLUCOSE 138 (H) 06/24/2021 1125   BUN 12 06/24/2021 1125   BUN 20 05/24/2018 1618   CREATININE 0.78 06/24/2021 1125   CREATININE 0.87 05/30/2020 1019   CALCIUM 8.5 (L) 06/24/2021 1125   GFRNONAA >60 06/24/2021 1125   GFRAA >60 10/06/2018 0521    BNP    Component Value Date/Time    BNP 39.2 06/24/2021 1125    ProBNP    Component Value Date/Time   PROBNP 74 05/24/2018 1618   PROBNP 12.0 09/26/2009 1547    Imaging: DG Chest 2 View  Addendum Date: 06/24/2021   ADDENDUM REPORT: 06/24/2021 11:29 ADDENDUM: When compared to the prior exam, patchy infiltrates seen previously have resolved. Electronically Signed   By: Lavonia Dana M.D.   On: 06/24/2021 11:29   Result Date: 06/24/2021 CLINICAL DATA:  Cough, chest pain, just finished 10 day course of antibiotics for pneumonia EXAM: CHEST - 2 VIEW COMPARISON:  06/14/2021 FINDINGS: Hypoinflated lungs with bibasilar atelectasis. Stable heart size and mediastinal contours. Remaining lungs clear. No pleural effusion or pneumothorax. Osseous demineralization. IMPRESSION: Hypoinflated lungs with bibasilar atelectasis. Electronically Signed: By: Lavonia Dana M.D. On: 06/24/2021 11:15   DG Chest 2 View  Result Date: 06/14/2021 CLINICAL DATA:  Chest tightness and short of breath EXAM: CHEST - 2 VIEW COMPARISON:  10/06/2018 FINDINGS: Hypoventilation with decreased lung volume similar to the prior study. Chronic bibasilar atelectasis. In addition, there appears to be progressive airspace disease in the lung bases which could be due to pneumonia. No effusion. IMPRESSION: Chronic bibasilar atelectasis/scarring with low lung volumes. Interval development of mild bibasilar airspace disease, possible pneumonia. Electronically Signed   By: Franchot Gallo M.D.   On: 06/14/2021 11:46   CT Angio Chest PE W and/or Wo Contrast  Result Date: 06/24/2021 CLINICAL DATA:  PE suspected, low/intermediate prob, positive D-dimer EXAM: CT ANGIOGRAPHY CHEST WITH CONTRAST TECHNIQUE: Multidetector CT imaging of the chest was performed using the standard protocol during bolus administration of intravenous contrast. Multiplanar CT image reconstructions and MIPs were obtained to evaluate the vascular anatomy. CONTRAST:  133mL OMNIPAQUE IOHEXOL 350 MG/ML SOLN  COMPARISON:  None. FINDINGS: Cardiovascular: Satisfactory opacification of the pulmonary arteries to the segmental level. No  evidence of pulmonary embolism. Coronary artery and aortic atherosclerosis (ICD10-I70.0). The small pericardial effusion. Mediastinum/Nodes: Borderline enlarged right paratracheal and left AP window lymph nodes. Lungs/Pleura: Numerous rounded pulmonary nodules throughout both lungs. Index nodule in the left upper lobe measures 1.9 cm on series 6, image 23. Index lesion in the right upper lobe measures 1.4 cm on series 6, image 26. Small left greater than right pleural effusions. No evidence of cavitation. Small left greater than right pleural effusions. No pneumothorax. Upper Abdomen: Ill-defined area of hypoattenuation within the lateral left hepatic lobe (series 4, image 110). Incompletely characterized retropharyngeal lymph nodes. Musculoskeletal: Degenerative changes of the thoracic spine with flowing anterior osteophytes. Lytic or blastic lesions identified. No evidence of acute fracture. Review of the MIP images confirms the above findings. IMPRESSION: 1. No evidence of acute pulmonary embolism. 2. Numerous round pulmonary nodules throughout both lungs, highly concerning for pulmonary metastases. 3. Borderline enlarged right paratracheal and left AP window lymph nodes, indeterminate for nodal metastases. PET CT could provide more definitive nodal staging if clinically indicated. 4. Ill-defined area of hypoattenuation within the lateral left hepatic lobe and incompletely characterized retroperitoneal nodes. Given the above findings, recommend CT abdomen/pelvis with contrast to further characterize and to assess for a primary malignancy. 5. Small left greater than right pleural effusions. Electronically Signed   By: Margaretha Sheffield M.D.   On: 06/24/2021 14:01   CT ABDOMEN PELVIS W CONTRAST  Result Date: 06/24/2021 CLINICAL DATA:  Metastatic disease. EXAM: CT ABDOMEN AND PELVIS WITH  CONTRAST TECHNIQUE: Multidetector CT imaging of the abdomen and pelvis was performed using the standard protocol following bolus administration of intravenous contrast. CONTRAST:  50mL OMNIPAQUE IOHEXOL 300 MG/ML  SOLN COMPARISON:  May 24, 2013. FINDINGS: Lower chest: Multiple rounded masses are noted in the visualized lung bases consistent with metastatic disease. Hepatobiliary: No gallstones or biliary dilatation is noted. Nodular hepatic contours are noted suggesting hepatic cirrhosis. Ill-defined low density is noted in left hepatic lobe anteriorly which may represent metastatic lesion. 4.7 cm low density is noted in caudate lobe concerning for metastatic disease. Pancreas: Unremarkable. No pancreatic ductal dilatation or surrounding inflammatory changes. Spleen: Multiple small calcified splenic granulomata are noted. Adrenals/Urinary Tract: Adrenal glands appear normal. Right renal cyst is noted. No hydronephrosis or renal obstruction is noted. Urinary bladder is unremarkable. Stomach/Bowel: Stomach is within normal limits. Appendix appears normal. No evidence of bowel wall thickening, distention, or inflammatory changes. Vascular/Lymphatic: Aortic atherosclerosis. Several lymph nodes are noted in the portal caval region the largest measuring 1 cm. Reproductive: Severe prostatic enlargement is noted. Other: No abdominal wall hernia or abnormality. No abdominopelvic ascites. Musculoskeletal: No acute or significant osseous findings. IMPRESSION: Multiple pulmonary metastases are noted in the visualized lung bases. Hepatic cirrhosis is noted. 4.7 cm low density is noted in caudate lobe concerning for metastatic lesion. Ill-defined density is noted anteriorly in the left hepatic lobe which may also represent metastatic disease. Several enlarged lymph nodes are noted in the portacaval region, the largest measuring 1 cm. Severe prostatic enlargement is noted. Aortic Atherosclerosis (ICD10-I70.0). Electronically  Signed   By: Marijo Conception M.D.   On: 06/24/2021 16:53      No flowsheet data found.  No results found for: NITRICOXIDE      Assessment & Plan:   Multiple lung nodules on CT Multiple scattered lung nodules throughout the lungs suspicious for pulmonary metastasis- Patient be set up for a PET scan. Discussed CT results with patient and wife.  Plan  Patient Instructions  Set up for PET scan in next week.  Continue on Oxygen 2l/m  Albuterol inhaler As needed   Try to Restart CPAP At bedtime  .  Try CPAP dreamwear nasal mask .  Saline nasal gel At bedtime   Follow up with Dr. [name]  in 2 weeks to review results .  Please contact office for sooner follow up if symptoms do not improve or worsen or seek emergency care        Liver masses Recent CT abdomen and pelvis showed multiple pulmonary nodules along with 4.7 cm hepatic lesion and left hepatic lobe lesion.  Concerning for possible metastatic disease.  Several enlarged lymph nodes in the portocaval region. PET scan has been ordered.  Obstructive sleep apnea Patient has underlying obstructive sleep apnea.  Patient has not been wearing his CPAP.  Patient has been encouraged to restart CPAP.  We will order a DreamWear nasal mask.  Plan  Patient Instructions  Set up for PET scan in next week.  Continue on Oxygen 2l/m  Albuterol inhaler As needed   Try to Restart CPAP At bedtime  .  Try CPAP dreamwear nasal mask .  Saline nasal gel At bedtime   Follow up with Dr. [name]  in 2 weeks to review results .  Please contact office for sooner follow up if symptoms do not improve or worsen or seek emergency care        Chronic respiratory failure with hypoxia [facility]) Patient recently started on oxygen 2 L.  Patient is continue on O2 to maintain O2 saturation greater than 88 to 90%.   I spent   40 minutes dedicated to the care of this patient on the date of this encounter to include pre-visit review of records, face-to-face time  with the patient discussing conditions above, post visit ordering of testing, clinical documentation with the electronic health record, making appropriate referrals as documented, and communicating necessary findings to members of the patients care team.    [name], NP 06/27/2021

## 2021-06-27 NOTE — Assessment & Plan Note (Signed)
Patient recently started on oxygen 2 L.  Patient is continue on O2 to maintain O2 saturation greater than 88 to 90%.

## 2021-06-29 ENCOUNTER — Encounter (HOSPITAL_COMMUNITY): Payer: Self-pay

## 2021-06-29 ENCOUNTER — Inpatient Hospital Stay (HOSPITAL_COMMUNITY)
Admission: EM | Admit: 2021-06-29 | Discharge: 2021-07-02 | DRG: 177 | Disposition: A | Discharge status: Home Health | Payer: Medicare Other | Attending: Internal Medicine | Admitting: Internal Medicine

## 2021-06-29 ENCOUNTER — Emergency Department (HOSPITAL_COMMUNITY): Payer: Medicare Other

## 2021-06-29 ENCOUNTER — Other Ambulatory Visit: Payer: Self-pay

## 2021-06-29 DIAGNOSIS — Z96651 Presence of right artificial knee joint: Secondary | ICD-10-CM | POA: Diagnosis present

## 2021-06-29 DIAGNOSIS — Z794 Long term (current) use of insulin: Secondary | ICD-10-CM

## 2021-06-29 DIAGNOSIS — R000 Tachycardia, unspecified: Secondary | ICD-10-CM | POA: Diagnosis not present

## 2021-06-29 DIAGNOSIS — E785 Hyperlipidemia, unspecified: Secondary | ICD-10-CM | POA: Diagnosis present

## 2021-06-29 DIAGNOSIS — N400 Enlarged prostate without lower urinary tract symptoms: Secondary | ICD-10-CM | POA: Diagnosis present

## 2021-06-29 DIAGNOSIS — I272 Pulmonary hypertension, unspecified: Secondary | ICD-10-CM | POA: Diagnosis not present

## 2021-06-29 DIAGNOSIS — D696 Thrombocytopenia, unspecified: Secondary | ICD-10-CM | POA: Diagnosis not present

## 2021-06-29 DIAGNOSIS — Z955 Presence of coronary angioplasty implant and graft: Secondary | ICD-10-CM

## 2021-06-29 DIAGNOSIS — E782 Mixed hyperlipidemia: Secondary | ICD-10-CM | POA: Diagnosis present

## 2021-06-29 DIAGNOSIS — F32A Depression, unspecified: Secondary | ICD-10-CM | POA: Diagnosis present

## 2021-06-29 DIAGNOSIS — U071 COVID-19: Principal | ICD-10-CM | POA: Diagnosis present

## 2021-06-29 DIAGNOSIS — M1990 Unspecified osteoarthritis, unspecified site: Secondary | ICD-10-CM | POA: Diagnosis present

## 2021-06-29 DIAGNOSIS — J1282 Pneumonia due to coronavirus disease 2019: Secondary | ICD-10-CM | POA: Diagnosis not present

## 2021-06-29 DIAGNOSIS — R0902 Hypoxemia: Secondary | ICD-10-CM | POA: Diagnosis not present

## 2021-06-29 DIAGNOSIS — F419 Anxiety disorder, unspecified: Secondary | ICD-10-CM | POA: Diagnosis present

## 2021-06-29 DIAGNOSIS — I4891 Unspecified atrial fibrillation: Secondary | ICD-10-CM | POA: Diagnosis not present

## 2021-06-29 DIAGNOSIS — I10 Essential (primary) hypertension: Secondary | ICD-10-CM | POA: Diagnosis present

## 2021-06-29 DIAGNOSIS — Z23 Encounter for immunization: Secondary | ICD-10-CM | POA: Diagnosis not present

## 2021-06-29 DIAGNOSIS — R4182 Altered mental status, unspecified: Secondary | ICD-10-CM | POA: Diagnosis present

## 2021-06-29 DIAGNOSIS — E1169 Type 2 diabetes mellitus with other specified complication: Secondary | ICD-10-CM

## 2021-06-29 DIAGNOSIS — Z833 Family history of diabetes mellitus: Secondary | ICD-10-CM

## 2021-06-29 DIAGNOSIS — G4733 Obstructive sleep apnea (adult) (pediatric): Secondary | ICD-10-CM | POA: Diagnosis present

## 2021-06-29 DIAGNOSIS — Z79899 Other long term (current) drug therapy: Secondary | ICD-10-CM

## 2021-06-29 DIAGNOSIS — J9621 Acute and chronic respiratory failure with hypoxia: Secondary | ICD-10-CM | POA: Diagnosis not present

## 2021-06-29 DIAGNOSIS — Z801 Family history of malignant neoplasm of trachea, bronchus and lung: Secondary | ICD-10-CM

## 2021-06-29 DIAGNOSIS — K760 Fatty (change of) liver, not elsewhere classified: Secondary | ICD-10-CM | POA: Diagnosis present

## 2021-06-29 DIAGNOSIS — Z6835 Body mass index (BMI) 35.0-35.9, adult: Secondary | ICD-10-CM

## 2021-06-29 DIAGNOSIS — Z28310 Unvaccinated for covid-19: Secondary | ICD-10-CM

## 2021-06-29 DIAGNOSIS — Z9981 Dependence on supplemental oxygen: Secondary | ICD-10-CM

## 2021-06-29 DIAGNOSIS — I700 Atherosclerosis of aorta: Secondary | ICD-10-CM | POA: Diagnosis present

## 2021-06-29 DIAGNOSIS — R410 Disorientation, unspecified: Secondary | ICD-10-CM | POA: Diagnosis not present

## 2021-06-29 DIAGNOSIS — Z0389 Encounter for observation for other suspected diseases and conditions ruled out: Secondary | ICD-10-CM | POA: Diagnosis not present

## 2021-06-29 DIAGNOSIS — R0689 Other abnormalities of breathing: Secondary | ICD-10-CM | POA: Diagnosis not present

## 2021-06-29 DIAGNOSIS — I351 Nonrheumatic aortic (valve) insufficiency: Secondary | ICD-10-CM | POA: Diagnosis present

## 2021-06-29 DIAGNOSIS — R911 Solitary pulmonary nodule: Secondary | ICD-10-CM | POA: Diagnosis present

## 2021-06-29 DIAGNOSIS — I2510 Atherosclerotic heart disease of native coronary artery without angina pectoris: Secondary | ICD-10-CM | POA: Diagnosis present

## 2021-06-29 DIAGNOSIS — Z8249 Family history of ischemic heart disease and other diseases of the circulatory system: Secondary | ICD-10-CM

## 2021-06-29 DIAGNOSIS — N3281 Overactive bladder: Secondary | ICD-10-CM | POA: Diagnosis present

## 2021-06-29 LAB — COMPREHENSIVE METABOLIC PANEL
ALT: 23 U/L (ref 0–44)
AST: 24 U/L (ref 15–41)
Albumin: 3.2 g/dL — ABNORMAL LOW (ref 3.5–5.0)
Alkaline Phosphatase: 149 U/L — ABNORMAL HIGH (ref 38–126)
Anion gap: 7 (ref 5–15)
BUN: 13 mg/dL (ref 8–23)
CO2: 26 mmol/L (ref 22–32)
Calcium: 8.3 mg/dL — ABNORMAL LOW (ref 8.9–10.3)
Chloride: 103 mmol/L (ref 98–111)
Creatinine, Ser: 0.56 mg/dL — ABNORMAL LOW (ref 0.61–1.24)
GFR, Estimated: >60 mL/min (ref >60)
Glucose, Bld: 168 mg/dL — ABNORMAL HIGH (ref 70–99)
Potassium: 4.2 mmol/L (ref 3.5–5.1)
Sodium: 136 mmol/L (ref 135–145)
Total Bilirubin: 0.9 mg/dL (ref 0.3–1.2)
Total Protein: 6.9 g/dL (ref 6.5–8.1)

## 2021-06-29 LAB — CBC WITH DIFFERENTIAL/PLATELET
Abs Immature Granulocytes: 0.02 10*3/uL (ref 0.00–0.07)
Basophils Absolute: 0 10*3/uL (ref 0.0–0.1)
Basophils Relative: 0 %
Eosinophils Absolute: 0 10*3/uL (ref 0.0–0.5)
Eosinophils Relative: 0 %
HCT: 37.9 % — ABNORMAL LOW (ref 39.0–52.0)
Hemoglobin: 12.7 g/dL — ABNORMAL LOW (ref 13.0–17.0)
Immature Granulocytes: 0 %
Lymphocytes Relative: 13 %
Lymphs Abs: 0.6 10*3/uL — ABNORMAL LOW (ref 0.7–4.0)
MCH: 31.8 pg (ref 26.0–34.0)
MCHC: 33.5 g/dL (ref 30.0–36.0)
MCV: 94.8 fL (ref 80.0–100.0)
Monocytes Absolute: 0.6 10*3/uL (ref 0.1–1.0)
Monocytes Relative: 12 %
Neutro Abs: 3.7 10*3/uL (ref 1.7–7.7)
Neutrophils Relative %: 75 %
Platelets: 116 10*3/uL — ABNORMAL LOW (ref 150–400)
RBC: 4 MIL/uL — ABNORMAL LOW (ref 4.22–5.81)
RDW: 13.2 % (ref 11.5–15.5)
WBC: 4.9 10*3/uL (ref 4.0–10.5)
nRBC: 0 % (ref 0.0–0.2)

## 2021-06-29 LAB — LACTIC ACID, PLASMA: Lactic Acid, Venous: 1.2 mmol/L (ref 0.5–1.9)

## 2021-06-29 LAB — APTT: aPTT: 28 seconds (ref 24–36)

## 2021-06-29 LAB — PROTIME-INR
INR: 1.1 (ref 0.8–1.2)
Prothrombin Time: 14.5 seconds (ref 11.4–15.2)

## 2021-06-29 LAB — TROPONIN I (HIGH SENSITIVITY): Troponin I (High Sensitivity): 12 ng/L (ref <18)

## 2021-06-29 MED ORDER — AZITHROMYCIN 500 MG IV SOLR
500.0000 mg | INTRAVENOUS | Status: DC
Start: 2021-06-29 — End: 2021-06-30
  Administered 2021-06-29: 500 mg via INTRAVENOUS
  Filled 2021-06-29: qty 500

## 2021-06-29 MED ORDER — LACTATED RINGERS IV BOLUS
1000.0000 mL | Freq: Once | INTRAVENOUS | Status: AC
  Administered 2021-06-29: 1000 mL via INTRAVENOUS

## 2021-06-29 MED ORDER — SODIUM CHLORIDE 0.9 % IV SOLN
2.0000 g | Freq: Once | INTRAVENOUS | Status: AC
  Administered 2021-06-29: 2 g via INTRAVENOUS
  Filled 2021-06-29: qty 20

## 2021-06-29 NOTE — ED Triage Notes (Signed)
Patient BIB [facility] from home. Daughter called out, pneumonia on Monday. Only prescription was home oxygen for 2L [place]. Patient more confused than normal. Daughter gave him a diaper, he said "what is this for" patient hot to touch. Fever 101.85F, EMS gave 1000mg  Tylenol. Irregular HR, atrial fibrillation with bigeminy. EMS smelled UTI-ish. Daughter said he is known to have UTI in past.  EMS 130/68 HR 98 2L [place] 93%, 4L [place] 96% 18 G right forearm

## 2021-06-29 NOTE — ED Provider Notes (Signed)
[facility] Emergency Department Provider Note MRN:  [id number]  Arrival date & time: 06/30/21     Chief Complaint   Altered Mental Status   History of Present Illness   Jacob Bishop is a 80 y.o. year-old male with a history of diabetes chronic respiratory failure presenting to the ED with chief complaint of altered mental status.  Reportedly was diagnosed with pneumonia a few days ago, home on antibiotics.  Baseline 2 L nasal cannula.  More confused than normal today.  Febrile.  Irregular heart rate.  Patient is confused, denies any pain.  I was unable to obtain an accurate HPI, PMH, or ROS due to the patient's altered mental status.  Level 5 caveat. Review of Systems  Positive for fever, altered mental status.  Patient's Health History    Past Medical History:  Diagnosis Date   AI (aortic insufficiency) 01/11/2018   Trace, noted on ECHO   Arthritis    CAD in native artery    Nuclear stress test 10/19: EF 58, normal perfusion, low risk   Cellulitis    Dermatitis    Diabetes mellitus    type II   Diastolic dysfunction 81/15/7262   Mild, noted on ECHO   Diverticulosis 01/31/2016   ASCENDING COLON AND CECUM, NOTED ON COLONOSCOPY   DOE (dyspnea on exertion)    Edema    Fatty liver 09/18/2004   Noted on [place] ABD   History of kidney stones 05/23/2002   Noted on CT Abd   Hx of colonic polyps 01/31/2016   Hyperlipidemia    Hypertension    Internal hemorrhoids 01/31/2016   Noted on colonoscopy   Obesity    OSA (obstructive sleep apnea)    cpap   Positional vertigo    Pulmonary hypertension ([place]) 01/11/2018   Mild, noted on ECHO   Rhinosinusitis    Skin lesion    TR (tricuspid regurgitation) 01/11/2018   Trace, noted on ECHO    Past Surgical History:  Procedure Laterality Date   CARDIAC CATHETERIZATION     COLONOSCOPY     CORONARY STENT PLACEMENT     3 stents /1997   POLYPECTOMY     TONSILLECTOMY     TOTAL KNEE ARTHROPLASTY Right  10/04/2018   Procedure: RIGHT TOTAL KNEE ARTHROPLASTY;  Surgeon: Gaynelle Arabian, MD;  Location: [facility];  Service: Orthopedics;  Laterality: Right;  Adductor Block    Family History  Problem Relation Age of Onset   Cancer Mother        ? lung cancer   Heart disease Father    Heart attack Father    Heart disease Sister    Diabetes Sister    Heart attack Sister    Heart attack Brother     Social History   Socioeconomic History   Marital status: Married    Spouse name: Not on file   Number of children: Not on file   Years of education: Not on file   Highest education level: Not on file  Occupational History   Occupation: OWNER, Health and safety inspector: [employer]  Tobacco Use   Smoking status: Former    Packs/day: 1.00    Years: 20.00    Pack years: 20.00    Types: Cigarettes    Quit date: 01/10/1996    Years since quitting: 25.4   Smokeless tobacco: Never  Vaping Use   Vaping Use: Never used  Substance and Sexual Activity   Alcohol use: No  Drug use: No   Sexual activity: Not on file  Other Topics Concern   Not on file  Social History Narrative   Grew up in [place]. Mother was [place], Father [place].   He works daily - owns a [employer]    Married for 30+ years   Has a daughter who lives in [place] Strain: Not on [employer] Insecurity: No Food Insecurity   Worried About Charity fundraiser in the Last Year: Never true   Arboriculturist in the Last Year: Never true  Transportation Needs: Not on file  Physical Activity: Inactive   Days of Exercise per Week: 0 days   Minutes of Exercise per Session: 0 min  Stress: No Stress Concern Present   Feeling of Stress : Not at all  Social Connections: Moderately Isolated   Frequency of Communication with Friends and Family: Twice a week   Frequency of Social Gatherings with Friends and Family: Twice a week   Attends  Religious Services: Never   Marine scientist or Organizations: No   Attends Archivist Meetings: Never   Marital Status: Married  Human resources officer Violence: Not on file     Physical Exam   Vitals:   06/29/21 2345 06/30/21 0020  BP: (!) 174/75 136/64  Pulse: (!) 105 70  Resp: 14 (!) 26  Temp:    SpO2: 96% 95%    CONSTITUTIONAL: Chronically ill-appearing, NAD NEURO:  Alert, oriented to name and place but not time, moves all extremities equally EYES:  eyes equal and reactive ENT/NECK:  no LAD, no JVD CARDIO: Regular rate, well-perfused, normal S1 and S2 PULM:  CTAB no wheezing or rhonchi, mildly tachypneic GI/GU:  normal bowel sounds, non-distended, non-tender MSK/SPINE:  No gross deformities, no edema SKIN:  no rash, atraumatic PSYCH:  Appropriate speech and behavior  *Additional and/or pertinent findings included in MDM below  Diagnostic and Interventional Summary    EKG Interpretation  Date/Time:  Saturday June 29 2021 23:36:39 EDT Ventricular Rate:  114 PR Interval:  146 QRS Duration: 109 QT Interval:  350 QTC Calculation: 399 R Axis:   128 Text Interpretation: Right and left arm electrode reversal, interpretation assumes no reversal Sinus or ectopic atrial tachycardia Ventricular bigeminy Right axis deviation Abnormal R-wave progression, early transition Abnormal T, consider ischemia, diffuse leads Baseline wander in lead(s) V4 Confirmed by Gerlene Fee [phone]) on 06/29/2021 11:46:04 PM       Labs Reviewed  RESP PANEL BY RT-PCR (FLU A&B, COVID) ARPGX2 - Abnormal; Notable for the following components:      Result Value   SARS Coronavirus 2 by RT PCR POSITIVE (*)    All other components within normal limits  COMPREHENSIVE METABOLIC PANEL - Abnormal; Notable for the following components:   Glucose, Bld 168 (*)    Creatinine, Ser 0.56 (*)    Calcium 8.3 (*)    Albumin 3.2 (*)    Alkaline Phosphatase 149 (*)    All other components within  normal limits  CBC WITH DIFFERENTIAL/PLATELET - Abnormal; Notable for the following components:   RBC 4.00 (*)    Hemoglobin 12.7 (*)    HCT 37.9 (*)    Platelets 116 (*)    Lymphs Abs 0.6 (*)    All other components within normal limits  URINALYSIS, ROUTINE W REFLEX MICROSCOPIC - Abnormal; Notable for the following components:   Hgb  urine dipstick MODERATE (*)    Ketones, ur 5 (*)    All other components within normal limits  BRAIN NATRIURETIC PEPTIDE - Abnormal; Notable for the following components:   B Natriuretic Peptide 133.9 (*)    All other components within normal limits  CULTURE, BLOOD (ROUTINE X 2)  CULTURE, BLOOD (ROUTINE X 2)  URINE CULTURE  LACTIC ACID, PLASMA  PROTIME-INR  APTT  TROPONIN I (HIGH SENSITIVITY)  TROPONIN I (HIGH SENSITIVITY)    DG Chest Port 1 View  Final Result      Medications  azithromycin (ZITHROMAX) 500 mg in sodium chloride 0.9 % 250 mL IVPB (500 mg Intravenous New Bag/Given 06/29/21 2353)  dexamethasone (DECADRON) injection 6 mg (has no administration in time range)  cefTRIAXone (ROCEPHIN) 2 g in sodium chloride 0.9 % 100 mL IVPB (0 g Intravenous Stopped 06/29/21 2347)  lactated ringers bolus 1,000 mL (0 mLs Intravenous Stopped 06/30/21 0031)     Procedures  /  Critical Care .Critical Care Performed by: Maudie Flakes, MD Authorized by: Maudie Flakes, MD   Critical care provider statement:    Critical care time (minutes):  33   Critical care was necessary to treat or prevent imminent or life-threatening deterioration of the following conditions:  Respiratory failure   Critical care was time spent personally by me on the following activities:  Ordering and review of laboratory studies, ordering and review of radiographic studies, re-evaluation of patient's condition, review of old charts, examination of patient and discussions with consultants  ED Course and Medical Decision Making  I have reviewed the triage vital signs, the nursing  notes, and pertinent available records from the EMR.  Listed above are laboratory and imaging tests that I personally ordered, reviewed, and interpreted and then considered in my medical decision making (see below for details).  Concern for sepsis, possibly pulmonary origin given report of recent pneumonia.  Baseline 2 L nasal cannula, currently on 4 L nasal cannula in the low 90s.  Mildly tachypneic.  Providing Coverage.  Reassuring blood pressure and a history of acute respiratory failure and so will start with judicious fluids.  Suspect will need admission.     Patient has tested positive for COVID-19.  Will admit to medicine.  Barth Kirks. Sedonia Small, [place] [email or web address]  Final Clinical Impressions(s) / ED Diagnoses     ICD-10-CM   1. Acute on chronic respiratory failure with hypoxia (HCC)  J96.21     2. COVID-19  U07.1       ED Discharge Orders     None        Discharge Instructions Discussed with and Provided to Patient:   Discharge Instructions   None       Maudie Flakes, MD 06/30/21 [phone]

## 2021-06-29 NOTE — Sepsis Progress Note (Signed)
Monitoring for the code sepsis protocol. °

## 2021-06-30 DIAGNOSIS — Z8249 Family history of ischemic heart disease and other diseases of the circulatory system: Secondary | ICD-10-CM | POA: Diagnosis not present

## 2021-06-30 DIAGNOSIS — M1990 Unspecified osteoarthritis, unspecified site: Secondary | ICD-10-CM | POA: Diagnosis present

## 2021-06-30 DIAGNOSIS — J9621 Acute and chronic respiratory failure with hypoxia: Secondary | ICD-10-CM

## 2021-06-30 DIAGNOSIS — E782 Mixed hyperlipidemia: Secondary | ICD-10-CM | POA: Diagnosis present

## 2021-06-30 DIAGNOSIS — R911 Solitary pulmonary nodule: Secondary | ICD-10-CM | POA: Diagnosis present

## 2021-06-30 DIAGNOSIS — I272 Pulmonary hypertension, unspecified: Secondary | ICD-10-CM | POA: Diagnosis present

## 2021-06-30 DIAGNOSIS — J1282 Pneumonia due to coronavirus disease 2019: Secondary | ICD-10-CM | POA: Diagnosis present

## 2021-06-30 DIAGNOSIS — E785 Hyperlipidemia, unspecified: Secondary | ICD-10-CM | POA: Diagnosis not present

## 2021-06-30 DIAGNOSIS — D696 Thrombocytopenia, unspecified: Secondary | ICD-10-CM

## 2021-06-30 DIAGNOSIS — R531 Weakness: Secondary | ICD-10-CM | POA: Diagnosis not present

## 2021-06-30 DIAGNOSIS — F32A Depression, unspecified: Secondary | ICD-10-CM | POA: Diagnosis present

## 2021-06-30 DIAGNOSIS — K760 Fatty (change of) liver, not elsewhere classified: Secondary | ICD-10-CM | POA: Diagnosis present

## 2021-06-30 DIAGNOSIS — R4182 Altered mental status, unspecified: Secondary | ICD-10-CM | POA: Diagnosis present

## 2021-06-30 DIAGNOSIS — Z801 Family history of malignant neoplasm of trachea, bronchus and lung: Secondary | ICD-10-CM | POA: Diagnosis not present

## 2021-06-30 DIAGNOSIS — J80 Acute respiratory distress syndrome: Secondary | ICD-10-CM | POA: Diagnosis not present

## 2021-06-30 DIAGNOSIS — N3281 Overactive bladder: Secondary | ICD-10-CM | POA: Diagnosis present

## 2021-06-30 DIAGNOSIS — I10 Essential (primary) hypertension: Secondary | ICD-10-CM | POA: Diagnosis present

## 2021-06-30 DIAGNOSIS — I351 Nonrheumatic aortic (valve) insufficiency: Secondary | ICD-10-CM | POA: Diagnosis present

## 2021-06-30 DIAGNOSIS — Z6835 Body mass index (BMI) 35.0-35.9, adult: Secondary | ICD-10-CM | POA: Diagnosis not present

## 2021-06-30 DIAGNOSIS — E1169 Type 2 diabetes mellitus with other specified complication: Secondary | ICD-10-CM | POA: Diagnosis present

## 2021-06-30 DIAGNOSIS — I2510 Atherosclerotic heart disease of native coronary artery without angina pectoris: Secondary | ICD-10-CM | POA: Diagnosis present

## 2021-06-30 DIAGNOSIS — Z23 Encounter for immunization: Secondary | ICD-10-CM | POA: Diagnosis not present

## 2021-06-30 DIAGNOSIS — G4733 Obstructive sleep apnea (adult) (pediatric): Secondary | ICD-10-CM | POA: Diagnosis present

## 2021-06-30 DIAGNOSIS — I700 Atherosclerosis of aorta: Secondary | ICD-10-CM | POA: Diagnosis present

## 2021-06-30 DIAGNOSIS — Z743 Need for continuous supervision: Secondary | ICD-10-CM | POA: Diagnosis not present

## 2021-06-30 DIAGNOSIS — R0902 Hypoxemia: Secondary | ICD-10-CM | POA: Diagnosis not present

## 2021-06-30 DIAGNOSIS — U071 COVID-19: Secondary | ICD-10-CM | POA: Diagnosis present

## 2021-06-30 DIAGNOSIS — F419 Anxiety disorder, unspecified: Secondary | ICD-10-CM | POA: Diagnosis present

## 2021-06-30 LAB — HEMOGLOBIN A1C
Hgb A1c MFr Bld: 6 % — ABNORMAL HIGH (ref 4.8–5.6)
Mean Plasma Glucose: 125.5 mg/dL

## 2021-06-30 LAB — GLUCOSE, CAPILLARY
Glucose-Capillary: 148 mg/dL — ABNORMAL HIGH (ref 70–99)
Glucose-Capillary: 168 mg/dL — ABNORMAL HIGH (ref 70–99)
Glucose-Capillary: 238 mg/dL — ABNORMAL HIGH (ref 70–99)
Glucose-Capillary: 109 mg/dL — ABNORMAL HIGH (ref 70–99)
Glucose-Capillary: 147 mg/dL — ABNORMAL HIGH (ref 70–99)

## 2021-06-30 LAB — RESP PANEL BY RT-PCR (FLU A&B, COVID) ARPGX2
Influenza A by PCR: NEGATIVE
Influenza B by PCR: NEGATIVE
SARS Coronavirus 2 by RT PCR: POSITIVE — AB

## 2021-06-30 LAB — URINALYSIS, ROUTINE W REFLEX MICROSCOPIC
Bacteria, UA: NONE SEEN
Bilirubin Urine: NEGATIVE
Glucose, UA: NEGATIVE mg/dL
Ketones, ur: 5 mg/dL — AB
Leukocytes,Ua: NEGATIVE
Nitrite: NEGATIVE
Protein, ur: NEGATIVE mg/dL
Specific Gravity, Urine: 1.019 (ref 1.005–1.030)
pH: 5 (ref 5.0–8.0)

## 2021-06-30 LAB — BRAIN NATRIURETIC PEPTIDE: B Natriuretic Peptide: 133.9 pg/mL — ABNORMAL HIGH (ref 0.0–100.0)

## 2021-06-30 LAB — TROPONIN I (HIGH SENSITIVITY): Troponin I (High Sensitivity): 14 ng/L (ref <18)

## 2021-06-30 MED ORDER — BUPROPION HCL ER (XL) 150 MG PO TB24
150.0000 mg | ORAL_TABLET | Freq: Every day | ORAL | Status: DC
  Administered 2021-06-30 – 2021-07-02 (×3): 150 mg via ORAL
  Filled 2021-06-30 (×3): qty 1

## 2021-06-30 MED ORDER — SODIUM CHLORIDE 0.9 % IV SOLN
100.0000 mg | Freq: Once | INTRAVENOUS | Status: AC
  Administered 2021-06-30: 100 mg via INTRAVENOUS

## 2021-06-30 MED ORDER — SODIUM CHLORIDE 0.9 % IV SOLN
100.0000 mg | Freq: Every day | INTRAVENOUS | Status: DC
  Administered 2021-07-01 – 2021-07-02 (×2): 100 mg via INTRAVENOUS
  Filled 2021-06-30 (×2): qty 20

## 2021-06-30 MED ORDER — METHYLPREDNISOLONE SODIUM SUCC 125 MG IJ SOLR
1.0000 mg/kg | Freq: Two times a day (BID) | INTRAMUSCULAR | Status: DC
  Administered 2021-07-01 – 2021-07-02 (×3): 115.625 mg via INTRAVENOUS
  Filled 2021-06-30 (×3): qty 2

## 2021-06-30 MED ORDER — SODIUM CHLORIDE 0.9 % IV SOLN
100.0000 mg | Freq: Once | INTRAVENOUS | Status: AC
  Administered 2021-06-30: 100 mg via INTRAVENOUS
  Filled 2021-06-30 (×2): qty 20

## 2021-06-30 MED ORDER — TAMSULOSIN HCL 0.4 MG PO CAPS
0.4000 mg | ORAL_CAPSULE | Freq: Two times a day (BID) | ORAL | Status: DC
  Administered 2021-06-30 – 2021-07-02 (×6): 0.4 mg via ORAL
  Filled 2021-06-30 (×6): qty 1

## 2021-06-30 MED ORDER — INSULIN GLARGINE-YFGN 100 UNIT/ML ~~LOC~~ SOLN
15.0000 [IU] | Freq: Every day | SUBCUTANEOUS | Status: DC
  Administered 2021-06-30 – 2021-07-01 (×2): 15 [IU] via SUBCUTANEOUS
  Filled 2021-06-30 (×3): qty 0.15

## 2021-06-30 MED ORDER — SIMVASTATIN 10 MG PO TABS
20.0000 mg | ORAL_TABLET | Freq: Every day | ORAL | Status: DC
  Administered 2021-06-30 – 2021-07-02 (×3): 20 mg via ORAL
  Filled 2021-06-30 (×3): qty 2

## 2021-06-30 MED ORDER — PREDNISONE 50 MG PO TABS: 50.0000 mg | ORAL_TABLET | Freq: Every day | ORAL | Status: DC

## 2021-06-30 MED ORDER — DEXAMETHASONE SODIUM PHOSPHATE 10 MG/ML IJ SOLN
6.0000 mg | Freq: Once | INTRAMUSCULAR | Status: AC
  Administered 2021-06-30: 6 mg via INTRAVENOUS
  Filled 2021-06-30: qty 1

## 2021-06-30 MED ORDER — INFLUENZA VAC A&B SA ADJ QUAD 0.5 ML IM PRSY
0.5000 mL | PREFILLED_SYRINGE | INTRAMUSCULAR | Status: AC
  Administered 2021-07-01: 0.5 mL via INTRAMUSCULAR
  Filled 2021-06-30: qty 0.5

## 2021-06-30 MED ORDER — ALBUTEROL SULFATE HFA 108 (90 BASE) MCG/ACT IN AERS
2.0000 | INHALATION_SPRAY | Freq: Four times a day (QID) | RESPIRATORY_TRACT | Status: DC | PRN
  Filled 2021-06-30: qty 6.7

## 2021-06-30 MED ORDER — OXYBUTYNIN CHLORIDE ER 5 MG PO TB24
10.0000 mg | ORAL_TABLET | Freq: Every day | ORAL | Status: DC
  Administered 2021-06-30 – 2021-07-02 (×3): 10 mg via ORAL
  Filled 2021-06-30 (×3): qty 2

## 2021-06-30 MED ORDER — ENOXAPARIN SODIUM 60 MG/0.6ML IJ SOSY
60.0000 mg | PREFILLED_SYRINGE | INTRAMUSCULAR | Status: DC
  Administered 2021-06-30 – 2021-07-02 (×3): 60 mg via SUBCUTANEOUS
  Filled 2021-06-30 (×3): qty 0.6

## 2021-06-30 MED ORDER — INSULIN ASPART 100 UNIT/ML IJ SOLN
0.0000 [IU] | Freq: Three times a day (TID) | INTRAMUSCULAR | Status: DC
  Administered 2021-06-30: 3 [IU] via SUBCUTANEOUS
  Administered 2021-06-30 – 2021-07-01 (×2): 5 [IU] via SUBCUTANEOUS
  Administered 2021-07-01: 3 [IU] via SUBCUTANEOUS
  Administered 2021-07-01: 2 [IU] via SUBCUTANEOUS
  Administered 2021-07-02 (×2): 3 [IU] via SUBCUTANEOUS
  Administered 2021-07-02: 5 [IU] via SUBCUTANEOUS
  Filled 2021-06-30: qty 0.15

## 2021-06-30 NOTE — Progress Notes (Signed)
Pt is injury free, afebrile, alert, and oriented X 4. Vital signs were within the baseline during this shift. Pt's oxygen saturation is above 92% on 3 L [place]. He denies chest pain, SOB, nausea, vomiting, dizziness, signs or symptoms of bleeding or infection or acute changes during this shift. We will continue to monitor and work toward achieving the care plan goals.

## 2021-06-30 NOTE — Progress Notes (Addendum)
80 year old male admitted with acute on chronic hypoxia secondary to COVID.  He has a past medical history significant for obstructive sleep apnea on CPAP at home.was started on  2 L of oxygen at home on oct 31 st, obesity, type 2 diabetes, hypertension, thrombocytopenia, hyperlipidemia admitted with shortness of breath.  He was admitted early this morning.  He has history of lung nodules and liver lesions.  He is being worked up by pulmonary and has an outpatient PET scan scheduled.  On admission he was tachycardic temp was 99.4 he was placed on 4 L of oxygen. He is saturating 89% on 4 L on admission and currently 89% on 4 L.  His chest x-ray showed no active disease.  Patient reports shortness of breath dyspnea on exertion and generalized weakness. Will continue IV steroids nebulizers and remdesivir. Encourage out of bed incentive spirometry and ambulation.

## 2021-06-30 NOTE — Progress Notes (Signed)
RT inquired about cpap. Pt said he is fine without it. He said he did well only on O2 and would like to do the same tonight. RN aware to call if pt changes his mind.

## 2021-06-30 NOTE — Plan of Care (Signed)

## 2021-06-30 NOTE — ED Notes (Signed)
RN called and updated step daughter, Vania Rea.

## 2021-06-30 NOTE — Evaluation (Addendum)
Physical Therapy Evaluation Patient Details Name: JEFFERY BACHMEIER MRN: [id number] DOB: 1941-04-18 Today's Date: 06/30/2021  History of Present Illness  Pt is a 80 year old male admitted with acute on chronic hypoxia secondary to COVID. Notably he has been worked up by pulmonology for lung and liver lesions concerning for malignancy and has outpatient PET scan scheduled. PMH: sleep apnea on CPAP at home 2 L of oxygen at home 24/7, obesity, type 2 diabetes, hypertension, thrombocytopenia, hyperlipidemia  Clinical Impression  Pt admitted with above diagnosis. Pt independent at baseline, shares household chore duties with spouse, on O2 at home. Pt admits to sedentary lifestyle, sits in lift chair a lot, runs restaurant (financially) so isn't super active at baseline. Pt currently requiring mod A to come to sitting EOB and min A with transfers and limited steps in room- NT bringing RW into room at EOS. Pt with mild dyspnea while ambulating, able to continue conversation while on 3L and varying O2 readings between machines- RN notified. Pt declines sitting in recliner, wants to take a nap- verbalized will ask RN to assist to chair after resting. Educated pt on time OOB, acute PT and progressing back to baseline to return home with spouse and pt verbalizes agreement. Pt currently with functional limitations due to the deficits listed below (see PT Problem List). Pt will benefit from skilled PT to increase their independence and safety with mobility to allow discharge to the venue listed below.          Recommendations for follow up therapy are one component of a multi-disciplinary discharge planning process, led by the attending physician.  Recommendations may be updated based on patient status, additional functional criteria and insurance authorization.  Follow Up Recommendations Home health PT    Assistance Recommended at Discharge Intermittent Supervision/Assistance  Functional Status Assessment Patient  has had a recent decline in their functional status and demonstrates the ability to make significant improvements in function in a reasonable and predictable amount of time.  Equipment Recommendations  None recommended by PT    Recommendations for Other Services       Precautions / Restrictions Precautions Precautions: Fall Precaution Comments: monitor O2 Restrictions Weight Bearing Restrictions: No      Mobility  Bed Mobility Overal bed mobility: Needs Assistance Bed Mobility: Supine to Sit;Sit to Supine  Supine to sit: Mod assist Sit to supine: Mod assist   General bed mobility comments: pt pulling on therapist's hand to upright trunk into sitting, increased time to mobilize BLE over to EOB; mod A to lift BLE back into bed and reposition to comfort, increased time    Transfers Overall transfer level: Needs assistance Equipment used:  (recliner once standing) Transfers: Sit to/from Stand Sit to Stand: Min assist;From elevated surface  General transfer comment: min A to steady with powering to stand from elevated bed and recliner positioned anterior (no RW in room)    Ambulation/Gait Ambulation/Gait assistance: Min assist Gait Distance (Feet): 5 Feet Assistive device:  (holding to recliner) Gait Pattern/deviations: Step-to pattern;Decreased stride length Gait velocity: decreased   General Gait Details: pt performs standing marching, forward/backwards and lateral steps at bedside with hands on recliner back to steady self, min A to steady, 2/4 dyspnea on 3L with SpO2 reading 86% on heart monitor and 90% on dynamap, pt limited by coughing and fatigue  Stairs            Wheelchair Mobility    Modified Rankin (Stroke Patients Only)  Balance Overall balance assessment: Needs assistance   Sitting balance-Leahy Scale: Good Sitting balance - Comments: seated EOB   Standing balance support: During functional activity;Bilateral upper extremity  supported;Reliant on assistive device for balance       Pertinent Vitals/Pain Pain Assessment: No/denies pain    Home Living Family/patient expects to be discharged to:: Private residence Living Arrangements: Spouse/significant other Available Help at Discharge: Family;Available 24 hours/day Type of Home: House Home Access: Stairs to enter   Entrance Stairs-Number of Steps: 3 Alternate Level Stairs-Number of Steps: flight, pt has stair lift Home Layout: Two level;Bed/bath upstairs Home Equipment: Rolling Walker (2 wheels);Cane - single point      Prior Function Prior Level of Function : Independent/Modified Independent  Mobility Comments: pt reports ind with ambulation and transfers, denies falls ADLs Comments: pt reports ind with self care, shares chore duties with spouse     Hand Dominance   Dominant Hand: Right    Extremity/Trunk Assessment   Upper Extremity Assessment Upper Extremity Assessment: Overall WFL for tasks assessed    Lower Extremity Assessment Lower Extremity Assessment: Overall WFL for tasks assessed (AROM WNL, strength 4/5 throughout, denies numbness/tingling in BLE)    Cervical / Trunk Assessment Cervical / Trunk Assessment: Normal  Communication   Communication: No difficulties  Cognition Arousal/Alertness: Awake/alert Behavior During Therapy: WFL for tasks assessed/performed Overall Cognitive Status: Within Functional Limits for tasks assessed           General Comments General comments (skin integrity, edema, etc.): heart monitor pulse ox reads 86% on 3L and dynamap reads 90-95%; at EOS heart monitor reads 87% and dynamap reads 95% on 3L with no dyspnea while supine- RN notified of varying values    Exercises     Assessment/Plan    PT Assessment Patient needs continued PT services  PT Problem List Decreased strength;Decreased activity tolerance;Decreased balance;Decreased mobility;Decreased knowledge of use of DME;Cardiopulmonary status  limiting activity       PT Treatment Interventions DME instruction;Gait training;Stair training;Functional mobility training;Therapeutic activities;Therapeutic exercise;Balance training;Patient/family education    PT Goals (Current goals can be found in the Care Plan section)  Acute Rehab PT Goals Patient Stated Goal: return home PT Goal Formulation: With patient Time For Goal Achievement: 07/14/21 Potential to Achieve Goals: Good    Frequency Min 3X/week   Barriers to discharge        Co-evaluation               AM-PAC PT "6 Clicks" Mobility  Outcome Measure Help needed turning from your back to your side while in a flat bed without using bedrails?: A Little Help needed moving from lying on your back to sitting on the side of a flat bed without using bedrails?: A Lot Help needed moving to and from a bed to a chair (including a wheelchair)?: A Little Help needed standing up from a chair using your arms (e.g., wheelchair or bedside chair)?: A Little Help needed to walk in hospital room?: A Little Help needed climbing 3-5 steps with a railing? : A Lot 6 Click Score: 16    End of Session Equipment Utilized During Treatment: Oxygen Activity Tolerance: Patient tolerated treatment well;Patient limited by fatigue Patient left: in bed;with call bell/phone within reach;with bed alarm set Nurse Communication: Mobility status;Other (comment) (finger pulse ox vs dynamap pulse ox) PT Visit Diagnosis: Unsteadiness on feet (R26.81);Other abnormalities of gait and mobility (R26.89)    Time: 4098-1191 PT Time Calculation (min) (ACUTE ONLY): 25 min  Charges:   PT Evaluation $PT Eval Low Complexity: 1 Low PT Treatments $Therapeutic Activity: 8-22 mins         Tori Jemima Petko PT, DPT 06/30/21, 2:55 PM

## 2021-06-30 NOTE — H&P (Signed)
History and Physical    Jacob Bishop JJK:[id number] DOB: [birth year] DOA: 06/29/2021  PCP: [name], NP  Patient coming from: Home  I have personally briefly reviewed patient's old medical records in [place]  Chief Complaint: Shortness of breath  HPI: Jacob Bishop is a 80 y.o. male with medical history significant for pulmonary lung nodules, liver lesions, OSA, chronic hypoxemia on 2 L, hypertension, CAD, type 2 diabetes, overactive bladder, thrombocytopenia, hyperlipidemia and morbid obesity who presents with concerns of increasing shortness of breath.  Patient overall not a great historian.  Patient initially unsure of why he is here.  States his daughter who lives near to him called EMS.  He reports feeling lung pain and pain to his back.  It states he recently was diagnosed with pneumonia and finished a course of antibiotic.  He was tested for COVID at that time but was not told the results.  He normally wears 2 L of oxygen at baseline.  Notably he has been worked up by pulmonology for lung and liver lesions concerning for malignancy and has outpatient PET scan scheduled. Denies chest pain.  Denies any nausea, vomiting or diarrhea.  ED Course: He had temperature of 99.4 Fahrenheit, tachycardia with frequent PVCs with heart rate of 105, initially hypertensive with blood pressure 170/75 which has resolved.  He was placed on a 4 L via nasal cannula.  No leukocytosis, hemoglobin 12.7, platelet of 116.  Sodium of 136, K of 4.2, creatinine of 0.56, BG of 168.  Positive COVID PCR.  Patient is unvaccinated.  Chest x-ray is negative.  Review of Systems: Pertinent positives negatives as above as patient overall a poor historian  Past Medical History:  Diagnosis Date   AI (aortic insufficiency) 01/11/2018   Trace, noted on ECHO   Arthritis    CAD in native artery    Nuclear stress test 10/19: EF 58, normal perfusion, low risk   Cellulitis    Dermatitis    Diabetes mellitus     type II   Diastolic dysfunction 37/16/9678   Mild, noted on ECHO   Diverticulosis 01/31/2016   ASCENDING COLON AND CECUM, NOTED ON COLONOSCOPY   DOE (dyspnea on exertion)    Edema    Fatty liver 09/18/2004   Noted on [place] ABD   History of kidney stones 05/23/2002   Noted on CT Abd   Hx of colonic polyps 01/31/2016   Hyperlipidemia    Hypertension    Internal hemorrhoids 01/31/2016   Noted on colonoscopy   Obesity    OSA (obstructive sleep apnea)    cpap   Positional vertigo    Pulmonary hypertension ([place]) 01/11/2018   Mild, noted on ECHO   Rhinosinusitis    Skin lesion    TR (tricuspid regurgitation) 01/11/2018   Trace, noted on ECHO    Past Surgical History:  Procedure Laterality Date   CARDIAC CATHETERIZATION     COLONOSCOPY     CORONARY STENT PLACEMENT     3 stents /1997   POLYPECTOMY     TONSILLECTOMY     TOTAL KNEE ARTHROPLASTY Right 10/04/2018   Procedure: RIGHT TOTAL KNEE ARTHROPLASTY;  Surgeon: Gaynelle Arabian, MD;  Location: [facility];  Service: Orthopedics;  Laterality: Right;  Adductor Block     reports that he quit smoking about 25 years ago. His smoking use included cigarettes. He has a 20.00 pack-year smoking history. He has never used smokeless tobacco. He reports that he does not drink alcohol and  does not use drugs. Social History  Allergies  Allergen Reactions   Lipitor [Atorvastatin Calcium] Other (See Comments)    Muscle soreness   Oxycodone Anxiety    Causes patient to become shaky and dizzy. Unable to tolerate.    Family History  Problem Relation Age of Onset   Cancer Mother        ? lung cancer   Heart disease Father    Heart attack Father    Heart disease Sister    Diabetes Sister    Heart attack Sister    Heart attack Brother      Prior to Admission medications   Medication Sig Start Date End Date Taking? Authorizing Provider  albuterol (VENTOLIN HFA) 108 (90 Base) MCG/ACT inhaler Inhale 2 puffs into the lungs every 6 (six) hours  as needed for wheezing or shortness of breath. 06/14/21  Yes Nafziger, Tommi Rumps, NP  buPROPion (WELLBUTRIN XL) 150 MG 24 hr tablet Take 1 tablet (150 mg total) by mouth daily. 06/14/21  Yes Nafziger, Tommi Rumps, NP  Insulin Glargine (BASAGLAR KWIKPEN) 100 UNIT/ML INJECT 0.4 MLS (40 UNITS TOTAL) INTO THE SKIN AT BEDTIME. Patient taking differently: Inject 40 Units into the skin at bedtime. INJECT 0.4 MLS (40 UNITS TOTAL) INTO THE SKIN AT BEDTIME. 09/26/20  Yes Nafziger, Tommi Rumps, NP  meclizine (ANTIVERT) 12.5 MG tablet TAKE ONE TABLET BY MOUTH three times daily AS NEEDED FOR dizziness Patient taking differently: Take 12.5 mg by mouth 3 (three) times daily as needed. 06/18/21  Yes Nafziger, Tommi Rumps, NP  oxybutynin (DITROPAN-XL) 10 MG 24 hr tablet Take 10 mg by mouth daily. 02/29/20  Yes Ceasar Mons, MD  simvastatin (ZOCOR) 20 MG tablet Take 1 tablet (20 mg total) by mouth daily at 6 PM. 11/30/20  Yes Nafziger, Tommi Rumps, NP  tamsulosin (FLOMAX) 0.4 MG CAPS capsule Take 0.4 mg by mouth 2 (two) times daily. 03/07/15  Yes Ceasar Mons, MD  fluticasone [facility]) 50 MCG/ACT nasal spray Place 2 sprays into both nostrils daily as needed. Patient not taking: Reported on 06/30/2021 11/09/19 12/18/22  [name], NP  Insulin Pen Needle (BD PEN NEEDLE NANO U/F) 32G X 4 MM MISC USE AS DIRECTED TWICE A DAY 05/18/20   Nafziger, Tommi Rumps, NP  ketoconazole (NIZORAL) 2 % cream Apply 1 application topically daily. Apply twice daily until rash has resolved Patient not taking: Reported on 06/30/2021 09/14/19   [name], NP  levofloxacin (LEVAQUIN) 750 MG tablet Take 1 tablet (750 mg total) by mouth daily. Patient not taking: Reported on 06/30/2021 06/14/21   [name], NP  omeprazole (PRILOSEC) 20 MG capsule TAKE 1 CAPSULE BY MOUTH EVERY DAY Patient not taking: Reported on 06/30/2021 11/26/18   [name], NP    Physical Exam: Vitals:   06/29/21 2345 06/30/21 0020 06/30/21 0030 06/30/21 0100  BP: (!) 174/75  136/64 138/69 133/74  Pulse: (!) 105 70 96 (!) 43  Resp: 14 (!) 26 18 (!) 24  Temp:      TempSrc:      SpO2: 96% 95% 95% 95%    Constitutional: NAD, calm, comfortable, fatigued appearing elderly male laying at approximately 30 degree incline in bed Vitals:   06/29/21 2345 06/30/21 0020 06/30/21 0030 06/30/21 0100  BP: (!) 174/75 136/64 138/69 133/74  Pulse: (!) 105 70 96 (!) 43  Resp: 14 (!) 26 18 (!) 24  Temp:      TempSrc:      SpO2: 96% 95% 95% 95%   Eyes: PERRL, lids  and conjunctivae normal ENMT: Mucous membranes are moist.  Neck: normal, supple, no masses, no thyromegaly Respiratory: clear to auscultation bilaterally, no wheezing, no crackles. Normal respiratory effort. No accessory muscle use. On 4L via [place]. Cardiovascular: Regular rate and rhythm, no murmurs / rubs / gallops.  Mild bilateral distal nonpitting edema of the lower extremity.   Abdomen: no tenderness, no masses palpated.  Bowel sounds positive.  Musculoskeletal: no clubbing / cyanosis. No joint deformity upper and lower extremities. Good ROM, no contractures. Normal muscle tone.  Skin: no rashes, lesions, ulcers. No induration Neurologic: CN 2-12 grossly intact.  Strength 5/5 in all 4.  Psychiatric: Normal judgment and insight. Alert and oriented x 3. Normal mood.     Labs on Admission: I have personally reviewed following labs and imaging studies  CBC: Recent Labs  Lab 06/24/21 1125 06/29/21 2315  WBC 6.2 4.9  NEUTROABS 4.7 3.7  HGB 13.7 12.7*  HCT 40.0 37.9*  MCV 93.2 94.8  PLT 100* 675*   Basic Metabolic Panel: Recent Labs  Lab 06/24/21 1125 06/29/21 2315  NA 139 136  K 4.2 4.2  CL 102 103  CO2 27 26  GLUCOSE 138* 168*  BUN 12 13  CREATININE 0.78 0.56*  CALCIUM 8.5* 8.3*   GFR: Estimated Creatinine Clearance: 100.1 mL/min (A) (by C-G formula based on SCr of 0.56 mg/dL (L)). Liver Function Tests: Recent Labs  Lab 06/24/21 1125 06/29/21 2315  AST 22 24  ALT 20 23  ALKPHOS 147*  149*  BILITOT 1.4* 0.9  PROT 6.8 6.9  ALBUMIN 3.5 3.2*   No results for input(s): LIPASE, AMYLASE in the last 168 hours. No results for input(s): AMMONIA in the last 168 hours. Coagulation Profile: Recent Labs  Lab 06/29/21 2315  INR 1.1   Cardiac Enzymes: No results for input(s): CKTOTAL, CKMB, CKMBINDEX, TROPONINI in the last 168 hours. BNP (last 3 results) No results for input(s): PROBNP in the last 8760 hours. HbA1C: No results for input(s): HGBA1C in the last 72 hours. CBG: No results for input(s): GLUCAP in the last 168 hours. Lipid Profile: No results for input(s): CHOL, HDL, LDLCALC, TRIG, CHOLHDL, LDLDIRECT in the last 72 hours. Thyroid Function Tests: No results for input(s): TSH, T4TOTAL, FREET4, T3FREE, THYROIDAB in the last 72 hours. Anemia Panel: No results for input(s): VITAMINB12, FOLATE, FERRITIN, TIBC, IRON, RETICCTPCT in the last 72 hours. Urine analysis:    Component Value Date/Time   COLORURINE YELLOW 06/29/2021 [place] 06/29/2021 2315   LABSPEC 1.019 06/29/2021 2315   PHURINE 5.0 06/29/2021 2315   GLUCOSEU NEGATIVE 06/29/2021 2315   HGBUR MODERATE (A) 06/29/2021 2315   BILIRUBINUR NEGATIVE 06/29/2021 2315   BILIRUBINUR Negative 03/25/2018 0902   KETONESUR 5 (A) 06/29/2021 2315   PROTEINUR NEGATIVE 06/29/2021 2315   UROBILINOGEN 0.2 03/25/2018 0902   NITRITE NEGATIVE 06/29/2021 2315   LEUKOCYTESUR NEGATIVE 06/29/2021 2315    Radiological Exams on Admission: DG Chest Port 1 View  Result Date: 06/29/2021 CLINICAL DATA:  Possible sepsis, initial encounter EXAM: PORTABLE CHEST 1 VIEW COMPARISON:  06/24/2021 FINDINGS: Inspiratory effort is slightly improved. Cardiac shadow is stable. No focal infiltrate or effusion is noted. No acute bony abnormality is seen. IMPRESSION: No active disease. Electronically Signed   By: Inez Catalina M.D.   On: 06/29/2021 23:44      Assessment/Plan  Acute on chronic hypoxemic respiratory failure  secondary to COVID-19 viral infection -Patient chronically on 2 L at baseline.  Requiring 4 L at  admitted -Start IV remdesivir -Continue IV steroids -follow daily CMP   Chronic thrombocytopenia Platelet of 116.  He is stable.  Type 2 diabetes Past HbA1C have been controlled <7  Patient unsure of his medication but documented to take 40U of insulin glargine at bedtime  -BG of 116 on admit. Start with moderate SSI  OSA Patient has not tolerated CPAP and has been working with pulmonology to get a different nasal mask  Pulmonary nodule/liver lesions Patient currently being worked up by [facility] pulmonary with PET scan for lesions concerning for malignancy  Overactive bladder Continue oxybutynin  Hyperlipidemia Continue statin  Depression/anxiety Continue Wellbutrin  Morbid obesity BMI of 35  DVT prophylaxis:.Lovenox Code Status: Full Family Communication: Plan discussed with patient at bedside  disposition Plan: Home with observation Consults called:  Admission status: Observation  Level of care: Med-Surg  Status is: Observation  The patient remains OBS appropriate and will d/c before 2 midnights.        Orene Desanctis DO [facility]   If 7PM-7AM, please contact night-coverage [email or web address]   06/30/2021, 1:12 AM

## 2021-07-01 ENCOUNTER — Telehealth: Payer: Self-pay | Admitting: Pharmacist

## 2021-07-01 LAB — COMPREHENSIVE METABOLIC PANEL
ALT: 26 U/L (ref 0–44)
AST: 28 U/L (ref 15–41)
Albumin: 3 g/dL — ABNORMAL LOW (ref 3.5–5.0)
Alkaline Phosphatase: 145 U/L — ABNORMAL HIGH (ref 38–126)
Anion gap: 4 — ABNORMAL LOW (ref 5–15)
BUN: 20 mg/dL (ref 8–23)
CO2: 31 mmol/L (ref 22–32)
Calcium: 8.1 mg/dL — ABNORMAL LOW (ref 8.9–10.3)
Chloride: 103 mmol/L (ref 98–111)
Creatinine, Ser: 0.97 mg/dL (ref 0.61–1.24)
GFR, Estimated: >60 mL/min (ref >60)
Glucose, Bld: 125 mg/dL — ABNORMAL HIGH (ref 70–99)
Potassium: 4.1 mmol/L (ref 3.5–5.1)
Sodium: 138 mmol/L (ref 135–145)
Total Bilirubin: 0.6 mg/dL (ref 0.3–1.2)
Total Protein: 6.1 g/dL — ABNORMAL LOW (ref 6.5–8.1)

## 2021-07-01 LAB — URINE CULTURE: Culture: <10000 — AB

## 2021-07-01 LAB — CBC WITH DIFFERENTIAL/PLATELET
Abs Immature Granulocytes: 0.03 10*3/uL (ref 0.00–0.07)
Basophils Absolute: 0 10*3/uL (ref 0.0–0.1)
Basophils Relative: 0 %
Eosinophils Absolute: 0 10*3/uL (ref 0.0–0.5)
Eosinophils Relative: 0 %
HCT: 36.7 % — ABNORMAL LOW (ref 39.0–52.0)
Hemoglobin: 12.1 g/dL — ABNORMAL LOW (ref 13.0–17.0)
Immature Granulocytes: 1 %
Lymphocytes Relative: 22 %
Lymphs Abs: 1.1 10*3/uL (ref 0.7–4.0)
MCH: 31.9 pg (ref 26.0–34.0)
MCHC: 33 g/dL (ref 30.0–36.0)
MCV: 96.8 fL (ref 80.0–100.0)
Monocytes Absolute: 0.7 10*3/uL (ref 0.1–1.0)
Monocytes Relative: 15 %
Neutro Abs: 2.9 10*3/uL (ref 1.7–7.7)
Neutrophils Relative %: 62 %
Platelets: 98 10*3/uL — ABNORMAL LOW (ref 150–400)
RBC: 3.79 MIL/uL — ABNORMAL LOW (ref 4.22–5.81)
RDW: 13.2 % (ref 11.5–15.5)
WBC: 4.8 10*3/uL (ref 4.0–10.5)
nRBC: 0 % (ref 0.0–0.2)

## 2021-07-01 LAB — HEMOGLOBIN A1C
Hgb A1c MFr Bld: 6.2 % — ABNORMAL HIGH (ref 4.8–5.6)
Mean Plasma Glucose: 131.24 mg/dL

## 2021-07-01 LAB — GLUCOSE, CAPILLARY
Glucose-Capillary: 134 mg/dL — ABNORMAL HIGH (ref 70–99)
Glucose-Capillary: 196 mg/dL — ABNORMAL HIGH (ref 70–99)
Glucose-Capillary: 213 mg/dL — ABNORMAL HIGH (ref 70–99)
Glucose-Capillary: 208 mg/dL — ABNORMAL HIGH (ref 70–99)

## 2021-07-01 NOTE — Progress Notes (Signed)
PROGRESS NOTE    Jacob Bishop  XNA:[id number] DOB: 01-Aug-1941 DOA: 06/29/2021 PCP: [name], NP    Brief Narrative: 80 year old male admitted with acute on chronic hypoxia secondary to COVID.  He has a past medical history significant for obstructive sleep apnea on CPAP at home.was started on  2 L of oxygen at home on oct 31 st, obesity, type 2 diabetes, hypertension, thrombocytopenia, hyperlipidemia admitted with shortness of breath.  He was admitted early this morning.  He has history of lung nodules and liver lesions.  He is being worked up by pulmonary and has an outpatient PET scan scheduled.  On admission he was tachycardic temp was 99.4 he was placed on 4 L of oxygen. He is saturating 89% on 4 L on admission and currently 89% on 4 L.  His chest x-ray showed no active disease.  Patient reports shortness of breath dyspnea on exertion and generalized weakness.  Assessment & Plan:   Principal Problem:   Acute on chronic respiratory failure with hypoxia (HCC) Active Problems:   Hyperlipidemia   Obstructive sleep apnea   Morbid obesity (HCC)   Overactive bladder   Thrombocytopenia, unspecified ([place])   DM type 2 with diabetic mixed hyperlipidemia ([place])   #1 acute on chronic hypoxic respiratory failure due to COVID-19 infection patient admitted with shortness of breath generalized weakness and hypoxia.  He was 89% on 4 L in the ER.  He was started on 2 L of oxygen on June 24, 2021 when he came to the ER with similar complaints.  Prior to that he was not on oxygen.  He has history of sleep apnea and uses CPAP at home but he does not have oxygen at home.  He is being treated with remdesivir and steroids IV.  Encourage nebulizers.  Out of bed ambulatory oxygen saturation.  Encourage incentive spirometer and activity.  #2 type 2 diabetes restarted long-acting insulin.CBG (last 3) his A1c is 6.2 this admission. Recent Labs    06/30/21 2031 07/01/21 0820 07/01/21 1123  GLUCAP 147*  134* 196*     #3 obstructive sleep apnea on CPAP at home he does not want to use a CPAP in the hospital  #4 pulmonary and liver lesions being worked up by [employer] as outpatient with PET scan pending.  #5 morbid obesity complicates overall picture and prognosis.  #6 hyperlipidemia on statin  #7 overactive bladder on oxybutynin.                     Estimated body mass index is 35.62 kg/m as calculated from the following:   Height as of 06/27/21: 5\' 11"  (1.803 m).   Weight as of 06/27/21: 115.8 kg.  DVT prophylaxis: Lovenox  code Status: Full code Family Communication: Discussed with patient's daughter over the phone yesterday  disposition Plan:  Status is: Inpatient  Remains inpatient appropriate because: Worsening hypoxia with COVID   Consultants:  None  Procedures: None Antimicrobials: None  Subjective: Patient resting in bed has not gotten out of bed due to shortness of breath and weakness His son and his wife is also diagnosed with COVID yesterday  Objective: Vitals:   06/30/21 1047 06/30/21 1437 06/30/21 2034 07/01/21 0523  BP: 115/68 (!) 119/57 (!) 110/59 (!) 119/57  Pulse: 94 77 (!) 58 (!) 52  Resp: (!) 26 20 20  (!) 24  Temp: 98.6 F (37 C) 98.1 F (36.7 C) 98.4 F (36.9 C) 98.2 F (36.8 C)  TempSrc: Oral  Oral  Oral  SpO2: 96% 95% 97% 96%    Intake/Output Summary (Last 24 hours) at 07/01/2021 1457 Last data filed at 06/30/2021 1700 Gross per 24 hour  Intake 240 ml  Output --  Net 240 ml   There were no vitals filed for this visit.  Examination:  General exam: Appears calm and comfortable  Respiratory system: Rhonchi bilaterally with mild wheezing to auscultation. Respiratory effort normal. Cardiovascular system: S1 & S2 heard, RRR. No JVD, murmurs, rubs, gallops or clicks. No pedal edema. Gastrointestinal system: Abdomen is nondistended, soft and nontender. No organomegaly or masses felt. Normal bowel sounds heard. Central nervous system:  Alert and oriented. No focal neurological deficits. Extremities: 1+ pitting edema chronic venous stasis changes skin: No rashes, lesions or ulcers Psychiatry: Judgement and insight appear normal. Mood & affect appropriate.     Data Reviewed: I have personally reviewed following labs and imaging studies  CBC: Recent Labs  Lab 06/29/21 2315 07/01/21 0442  WBC 4.9 4.8  NEUTROABS 3.7 2.9  HGB 12.7* 12.1*  HCT 37.9* 36.7*  MCV 94.8 96.8  PLT 116* 98*   Basic Metabolic Panel: Recent Labs  Lab 06/29/21 2315 07/01/21 0442  NA 136 138  K 4.2 4.1  CL 103 103  CO2 26 31  GLUCOSE 168* 125*  BUN 13 20  CREATININE 0.56* 0.97  CALCIUM 8.3* 8.1*   GFR: Estimated Creatinine Clearance: 82.5 mL/min (by C-G formula based on SCr of 0.97 mg/dL). Liver Function Tests: Recent Labs  Lab 06/29/21 2315 07/01/21 0442  AST 24 28  ALT 23 26  ALKPHOS 149* 145*  BILITOT 0.9 0.6  PROT 6.9 6.1*  ALBUMIN 3.2* 3.0*   No results for input(s): LIPASE, AMYLASE in the last 168 hours. No results for input(s): AMMONIA in the last 168 hours. Coagulation Profile: Recent Labs  Lab 06/29/21 2315  INR 1.1   Cardiac Enzymes: No results for input(s): CKTOTAL, CKMB, CKMBINDEX, TROPONINI in the last 168 hours. BNP (last 3 results) No results for input(s): PROBNP in the last 8760 hours. HbA1C: Recent Labs    06/30/21 0250 07/01/21 0442  HGBA1C 6.0* 6.2*   CBG: Recent Labs  Lab 06/30/21 1138 06/30/21 1637 06/30/21 2031 07/01/21 0820 07/01/21 1123  GLUCAP 238* 109* 147* 134* 196*   Lipid Profile: No results for input(s): CHOL, HDL, LDLCALC, TRIG, CHOLHDL, LDLDIRECT in the last 72 hours. Thyroid Function Tests: No results for input(s): TSH, T4TOTAL, FREET4, T3FREE, THYROIDAB in the last 72 hours. Anemia Panel: No results for input(s): VITAMINB12, FOLATE, FERRITIN, TIBC, IRON, RETICCTPCT in the last 72 hours. Sepsis Labs: Recent Labs  Lab 06/29/21 2315  LATICACIDVEN 1.2    Recent  Results (from the past 240 hour(s))  Resp Panel by RT-PCR (Flu A&B, Covid) Nasopharyngeal Swab     Status: None   Collection Time: 06/24/21 11:25 AM   Specimen: Nasopharyngeal Swab; Nasopharyngeal(NP) swabs in vial transport medium  Result Value Ref Range Status   SARS Coronavirus 2 by RT PCR NEGATIVE NEGATIVE Final    Comment: (NOTE) SARS-CoV-2 target nucleic acids are NOT DETECTED.  The SARS-CoV-2 RNA is generally detectable in upper respiratory specimens during the acute phase of infection. The lowest concentration of SARS-CoV-2 viral copies this assay can detect is 138 copies/mL. A negative result does not preclude SARS-Cov-2 infection and should not be used as the sole basis for treatment or other patient management decisions. A negative result may occur with  improper specimen collection/handling, submission of specimen other than nasopharyngeal swab, presence  of viral mutation(s) within the areas targeted by this assay, and inadequate number of viral copies(<138 copies/mL). A negative result must be combined with clinical observations, patient history, and epidemiological information. The expected result is Negative.  Fact Sheet for Patients:  EntrepreneurPulse.com.au  Fact Sheet for Healthcare Providers:  IncredibleEmployment.be  This test is no t yet approved or cleared by the [place] FDA and  has been authorized for detection and/or diagnosis of SARS-CoV-2 by FDA under an Emergency Use Authorization (EUA). This EUA will remain  in effect (meaning this test can be used) for the duration of the COVID-19 declaration under Section 564(b)(1) of the Act, 21 U.S.C.section 360bbb-3(b)(1), unless the authorization is terminated  or revoked sooner.       Influenza A by PCR NEGATIVE NEGATIVE Final   Influenza B by PCR NEGATIVE NEGATIVE Final    Comment: (NOTE) The Xpert Xpress SARS-CoV-2/FLU/RSV plus assay is intended as an aid in the  diagnosis of influenza from Nasopharyngeal swab specimens and should not be used as a sole basis for treatment. Nasal washings and aspirates are unacceptable for Xpert Xpress SARS-CoV-2/FLU/RSV testing.  Fact Sheet for Patients: EntrepreneurPulse.com.au  Fact Sheet for Healthcare Providers: IncredibleEmployment.be  This test is not yet approved or cleared by the [place] FDA and has been authorized for detection and/or diagnosis of SARS-CoV-2 by FDA under an Emergency Use Authorization (EUA). This EUA will remain in effect (meaning this test can be used) for the duration of the COVID-19 declaration under Section 564(b)(1) of the Act, 21 U.S.C. section 360bbb-3(b)(1), unless the authorization is terminated or revoked.  Performed at [facility], [address] [place], [place] [address]   Resp Panel by RT-PCR (Flu A&B, Covid) Nasopharyngeal Swab     Status: Abnormal   Collection Time: 06/29/21 11:15 PM   Specimen: Nasopharyngeal Swab; Nasopharyngeal(NP) swabs in vial transport medium  Result Value Ref Range Status   SARS Coronavirus 2 by RT PCR POSITIVE (A) NEGATIVE Final    Comment: RESULT CALLED TO, READ BACK BY AND VERIFIED WITH: KISER,C RN @0035  ON 06/30/21 JACKSON,K (NOTE) SARS-CoV-2 target nucleic acids are DETECTED.  The SARS-CoV-2 RNA is generally detectable in upper respiratory specimens during the acute phase of infection. Positive results are indicative of the presence of the identified virus, but do not rule out bacterial infection or co-infection with other pathogens not detected by the test. Clinical correlation with patient history and other diagnostic information is necessary to determine patient infection status. The expected result is Negative.  Fact Sheet for Patients: EntrepreneurPulse.com.au  Fact Sheet for Healthcare Providers: IncredibleEmployment.be  This  test is not yet approved or cleared by the [place] FDA and  has been authorized for detection and/or diagnosis of SARS-CoV-2 by FDA under an Emergency Use Authorization (EUA).  This EUA will remain in effect (meaning this test ca n be used) for the duration of  the COVID-19 declaration under Section 564(b)(1) of the Act, 21 U.S.C. section 360bbb-3(b)(1), unless the authorization is terminated or revoked sooner.     Influenza A by PCR NEGATIVE NEGATIVE Final   Influenza B by PCR NEGATIVE NEGATIVE Final    Comment: (NOTE) The Xpert Xpress SARS-CoV-2/FLU/RSV plus assay is intended as an aid in the diagnosis of influenza from Nasopharyngeal swab specimens and should not be used as a sole basis for treatment. Nasal washings and aspirates are unacceptable for Xpert Xpress SARS-CoV-2/FLU/RSV testing.  Fact Sheet for Patients: EntrepreneurPulse.com.au  Fact Sheet for Healthcare Providers: IncredibleEmployment.be  This test is not yet approved or cleared by the [place] and has been authorized for detection and/or diagnosis of SARS-CoV-2 by FDA under an Emergency Use Authorization (EUA). This EUA will remain in effect (meaning this test can be used) for the duration of the COVID-19 declaration under Section 564(b)(1) of the Act, 21 U.S.C. section 360bbb-3(b)(1), unless the authorization is terminated or revoked.  Performed at [facility], [place] [address] [place], [place] [address]   Blood Culture (routine x 2)     Status: None (Preliminary result)   Collection Time: 06/29/21 11:15 PM   Specimen: BLOOD  Result Value Ref Range Status   Specimen Description   Final    BLOOD BLOOD LEFT FOREARM Performed at [facility] [address] [place], [place] [address]    Special Requests   Final    BOTTLES DRAWN AEROBIC AND ANAEROBIC Blood Culture adequate volume Performed at [facility] [address], [place], [place] [address]    Culture   Final    NO GROWTH 1 DAY Performed at [facility], [place] [address], [place], [place] [address]    Report Status PENDING  Incomplete  Urine Culture     Status: Abnormal   Collection Time: 06/29/21 11:15 PM   Specimen: Urine, Clean Catch  Result Value Ref Range Status   Specimen Description   Final    URINE, CLEAN CATCH Performed at [facility], [place] [address] [place], [place] [address]    Special Requests   Final    NONE Performed at [facility], [place] [address], [place], [place] [address]    Culture (A)  Final    <10,000 COLONIES/mL INSIGNIFICANT GROWTH Performed at [facility] [address], [place], [place] [address]    Report Status 07/01/2021 FINAL  Final  Blood Culture (routine x 2)     Status: None (Preliminary result)   Collection Time: 06/29/21 11:20 PM   Specimen: BLOOD  Result Value Ref Range Status   Specimen Description   Final    BLOOD RIGHT ANTECUBITAL Performed at [facility] [address], [place], [place] [address]    Special Requests   Final    BOTTLES DRAWN AEROBIC AND ANAEROBIC Blood Culture adequate volume Performed at [facility] [address] [place], [place] [address]    Culture   Final    NO GROWTH 1 DAY Performed at [facility], [place] [address], [place],  [address]    Report Status PENDING  Incomplete         Radiology Studies: DG Chest Port 1 View  Result Date: 06/29/2021 CLINICAL DATA:  Possible sepsis, initial encounter EXAM: PORTABLE CHEST 1 VIEW COMPARISON:  06/24/2021 FINDINGS: Inspiratory effort is slightly improved. Cardiac shadow is stable. No focal infiltrate or effusion is noted. No acute bony abnormality is seen. IMPRESSION: No active disease. Electronically Signed   By: Inez Catalina M.D.   On: 06/29/2021 23:44        Scheduled Meds:  buPROPion   150 mg Oral Daily   enoxaparin (LOVENOX) injection  60 mg Subcutaneous Q24H   insulin aspart  0-15 Units Subcutaneous TID WC   insulin glargine-yfgn  15 Units Subcutaneous QHS   methylPREDNISolone (SOLU-MEDROL) injection  1 mg/kg Intravenous Q12H   Followed by   Derrill Memo ON 07/04/2021] predniSONE  50 mg Oral Daily   oxybutynin  10  mg Oral Daily   simvastatin  20 mg Oral q1800   tamsulosin  0.4 mg Oral BID   Continuous Infusions:  remdesivir 100 mg in NS 100 mL 100 mg (07/01/21 1127)     LOS: 1 day   Georgette Shell, MD 07/01/2021, 2:57 PM

## 2021-07-01 NOTE — Progress Notes (Signed)
Pt is injury free, afebrile, alert, and oriented X 4. Vital signs were within the baseline during this shift. Pt's oxygen saturation is above 92% on 3 L [place]. He denies chest pain, SOB, nausea, vomiting, dizziness, signs or symptoms of bleeding or infection or acute changes during this shift. We will continue to monitor and work toward achieving the care plan goals.

## 2021-07-01 NOTE — Plan of Care (Signed)

## 2021-07-01 NOTE — Progress Notes (Signed)
Pt has declined CPAP for the night.  

## 2021-07-01 NOTE — Chronic Care Management (AMB) (Signed)
This encounter was created in error - please disregard.

## 2021-07-02 LAB — GLUCOSE, CAPILLARY
Glucose-Capillary: 200 mg/dL — ABNORMAL HIGH (ref 70–99)
Glucose-Capillary: 182 mg/dL — ABNORMAL HIGH (ref 70–99)
Glucose-Capillary: 215 mg/dL — ABNORMAL HIGH (ref 70–99)

## 2021-07-02 LAB — CBC WITH DIFFERENTIAL/PLATELET
Abs Immature Granulocytes: 0.04 10*3/uL (ref 0.00–0.07)
Basophils Absolute: 0 10*3/uL (ref 0.0–0.1)
Basophils Relative: 0 %
Eosinophils Absolute: 0 10*3/uL (ref 0.0–0.5)
Eosinophils Relative: 0 %
HCT: 37.1 % — ABNORMAL LOW (ref 39.0–52.0)
Hemoglobin: 12.6 g/dL — ABNORMAL LOW (ref 13.0–17.0)
Immature Granulocytes: 1 %
Lymphocytes Relative: 15 %
Lymphs Abs: 0.5 10*3/uL — ABNORMAL LOW (ref 0.7–4.0)
MCH: 31.9 pg (ref 26.0–34.0)
MCHC: 34 g/dL (ref 30.0–36.0)
MCV: 93.9 fL (ref 80.0–100.0)
Monocytes Absolute: 0.1 10*3/uL (ref 0.1–1.0)
Monocytes Relative: 4 %
Neutro Abs: 2.7 10*3/uL (ref 1.7–7.7)
Neutrophils Relative %: 80 %
Platelets: 90 10*3/uL — ABNORMAL LOW (ref 150–400)
RBC: 3.95 MIL/uL — ABNORMAL LOW (ref 4.22–5.81)
RDW: 12.9 % (ref 11.5–15.5)
WBC: 3.3 10*3/uL — ABNORMAL LOW (ref 4.0–10.5)
nRBC: 0 % (ref 0.0–0.2)

## 2021-07-02 LAB — COMPREHENSIVE METABOLIC PANEL
ALT: 26 U/L (ref 0–44)
AST: 27 U/L (ref 15–41)
Albumin: 2.8 g/dL — ABNORMAL LOW (ref 3.5–5.0)
Alkaline Phosphatase: 144 U/L — ABNORMAL HIGH (ref 38–126)
Anion gap: 8 (ref 5–15)
BUN: 21 mg/dL (ref 8–23)
CO2: 29 mmol/L (ref 22–32)
Calcium: 8.3 mg/dL — ABNORMAL LOW (ref 8.9–10.3)
Chloride: 98 mmol/L (ref 98–111)
Creatinine, Ser: 0.68 mg/dL (ref 0.61–1.24)
GFR, Estimated: >60 mL/min (ref >60)
Glucose, Bld: 200 mg/dL — ABNORMAL HIGH (ref 70–99)
Potassium: 4.7 mmol/L (ref 3.5–5.1)
Sodium: 135 mmol/L (ref 135–145)
Total Bilirubin: 0.5 mg/dL (ref 0.3–1.2)
Total Protein: 6.2 g/dL — ABNORMAL LOW (ref 6.5–8.1)

## 2021-07-02 MED ORDER — DEXAMETHASONE 6 MG PO TABS: 6.0000 mg | ORAL_TABLET | Freq: Every day | ORAL | 0 refills | Status: DC

## 2021-07-02 NOTE — TOC Transition Note (Addendum)
Transition of Care [facility]) - CM/SW Discharge Note   Patient Details  Name: Jacob Bishop MRN: [id number] Date of Birth: 07/05/1941  Transition of Care [facility]) CM/SW Contact:  Jacob Mage, LCSW Phone Number: 07/02/2021, 11:50 AM   Clinical Narrative:   Patient who is stable for discharge is recommended for [facility] PT.  Although reluctant to sign up, Jacob Bishop agrees to at least one session for an assessment to see if he is willing to then continue.  No agency preference.  Jacob Bishop with Jacob Bishop agrees to provide [facility] services.  Jacob Bishop requests ambulance ride home as she feels unsafe transporting due to a gate, steps and dogs. PTAR arranged. No further needs identified.  TOC sign off.  Addendum: Jacob Bishop also asked about increased services in the home.  I went over both private pay and MCD application options with her.    Final next level of care: [place] Barriers to Discharge: No Barriers Identified   Patient Goals and CMS Choice        Discharge Placement                       Discharge Plan and Services                                     Social Determinants of Health (SDOH) Interventions     Readmission Risk Interventions No flowsheet data found.

## 2021-07-02 NOTE — Discharge Summary (Signed)
Physician Discharge Summary  Jacob Bishop ZDG:[id number] DOB: Feb 16, 1941 DOA: 06/29/2021  PCP: [name], NP  Admit date: 06/29/2021 Discharge date: 07/02/2021  Admitted From: Home Disposition: Home  Recommendations for Outpatient Follow-up:  Follow up with PCP in 1-2 weeks Please obtain BMP/CBC in one week  Home Health: Yes Equipment/Devices: None Discharge Condition: Stable CODE STATUS: Full code Diet recommendation: Cardiac Brief/Interim Summary:  80 year old male admitted with acute on chronic hypoxia secondary to COVID.  He has a past medical history significant for obstructive sleep apnea on CPAP at home.was started on  2 L of oxygen at home on oct 31 st, obesity, type 2 diabetes, hypertension, thrombocytopenia, hyperlipidemia admitted with shortness of breath.  He was admitted early this morning.  He has history of lung nodules and liver lesions.  He is being worked up by pulmonary and has an outpatient PET scan scheduled.  On admission he was tachycardic temp was 99.4 he was placed on 4 L of oxygen. He is saturating 89% on 4 L on admission and currently 89% on 4 L.  His chest x-ray showed no active disease.  Patient reports shortness of breath dyspnea on exertion and generalized weakness  Discharge Diagnoses:  Principal Problem:   Acute on chronic respiratory failure with hypoxia (HCC) Active Problems:   Hyperlipidemia   Obstructive sleep apnea   Morbid obesity (HCC)   Overactive bladder   Thrombocytopenia, unspecified ([place])   DM type 2 with diabetic mixed hyperlipidemia ([place])  #1 acute on chronic hypoxic respiratory failure due to COVID-19 infection patient admitted with shortness of breath generalized weakness and hypoxia.  He was 89% on 4 L in the ER.  He was treated with remdesivir for 3 days and steroids.  He was given prescriptions for Decadron for 7 more days.  He was asked to continue 2 L of oxygen at home.  He has history of sleep apnea and uses CPAP at home.  #2  type 2 diabetes restarted long-acting insulin.CBG (last 3) his A1c is 6.2 this admission. Recent Labs (last 2 labs)        Recent Labs    06/30/21 2031 07/01/21 0820 07/01/21 1123  GLUCAP 147* 134* 196*          #3 obstructive sleep apnea on CPAP at home    #4 pulmonary and liver lesions being worked up by [employer] as outpatient with PET scan pending.   #5 morbid obesity complicates overall picture and prognosis.   #6 hyperlipidemia on statin   #7 overactive bladder on oxybutynin.    Estimated body mass index is 35.62 kg/m as calculated from the following:   Height as of 06/27/21: 5\' 11"  (1.803 m).   Weight as of 06/27/21: 115.8 kg.  Discharge Instructions  Discharge Instructions     Diet - low sodium heart healthy   Complete by: As directed    Increase activity slowly   Complete by: As directed       Allergies as of 07/02/2021       Reactions   Lipitor [atorvastatin Calcium] Other (See Comments)   Muscle soreness   Oxycodone Anxiety   Causes patient to become shaky and dizzy. Unable to tolerate.        Medication List     STOP taking these medications    fluticasone 50 MCG/ACT nasal spray Commonly known as: FLONASE   ketoconazole 2 % cream Commonly known as: NIZORAL   levofloxacin 750 MG tablet Commonly known as: Levaquin   omeprazole  20 MG capsule Commonly known as: PRILOSEC       TAKE these medications    albuterol 108 (90 Base) MCG/ACT inhaler Commonly known as: VENTOLIN HFA Inhale 2 puffs into the lungs every 6 (six) hours as needed for wheezing or shortness of breath.   Basaglar KwikPen 100 UNIT/ML INJECT 0.4 MLS (40 UNITS TOTAL) INTO THE SKIN AT BEDTIME. What changed:  how much to take how to take this when to take this   BD Pen Needle Nano U/F 32G X 4 MM Misc Generic drug: Insulin Pen Needle USE AS DIRECTED TWICE A DAY   buPROPion 150 MG 24 hr tablet Commonly known as: Wellbutrin XL Take 1 tablet (150 mg total) by mouth  daily.   dexamethasone 6 MG tablet Commonly known as: DECADRON Take 1 tablet (6 mg total) by mouth daily.   meclizine 12.5 MG tablet Commonly known as: ANTIVERT TAKE ONE TABLET BY MOUTH three times daily AS NEEDED FOR dizziness What changed: See the new instructions.   oxybutynin 10 MG 24 hr tablet Commonly known as: DITROPAN-XL Take 10 mg by mouth daily.   simvastatin 20 MG tablet Commonly known as: ZOCOR Take 1 tablet (20 mg total) by mouth daily at 6 PM.   tamsulosin 0.4 MG Caps capsule Commonly known as: FLOMAX Take 0.4 mg by mouth 2 (two) times daily.        Follow-up Information     [facility] Follow up.   Specialty: Home Health Services Why: They will come to your home to work with you for physical therapy Contact information: [address] [place] [address] [phone]                Allergies  Allergen Reactions   Lipitor [Atorvastatin Calcium] Other (See Comments)    Muscle soreness   Oxycodone Anxiety    Causes patient to become shaky and dizzy. Unable to tolerate.    Consultations: none   Procedures/Studies: DG Chest 2 View  Addendum Date: 06/24/2021   ADDENDUM REPORT: 06/24/2021 11:29 ADDENDUM: When compared to the prior exam, patchy infiltrates seen previously have resolved. Electronically Signed   By: Lavonia Dana M.D.   On: 06/24/2021 11:29   Result Date: 06/24/2021 CLINICAL DATA:  Cough, chest pain, just finished 10 day course of antibiotics for pneumonia EXAM: CHEST - 2 VIEW COMPARISON:  06/14/2021 FINDINGS: Hypoinflated lungs with bibasilar atelectasis. Stable heart size and mediastinal contours. Remaining lungs clear. No pleural effusion or pneumothorax. Osseous demineralization. IMPRESSION: Hypoinflated lungs with bibasilar atelectasis. Electronically Signed: By: Lavonia Dana M.D. On: 06/24/2021 11:15   DG Chest 2 View  Result Date: 06/14/2021 CLINICAL DATA:  Chest tightness and short of breath EXAM:  CHEST - 2 VIEW COMPARISON:  10/06/2018 FINDINGS: Hypoventilation with decreased lung volume similar to the prior study. Chronic bibasilar atelectasis. In addition, there appears to be progressive airspace disease in the lung bases which could be due to pneumonia. No effusion. IMPRESSION: Chronic bibasilar atelectasis/scarring with low lung volumes. Interval development of mild bibasilar airspace disease, possible pneumonia. Electronically Signed   By: Franchot Gallo M.D.   On: 06/14/2021 11:46   CT Angio Chest PE W and/or Wo Contrast  Result Date: 06/24/2021 CLINICAL DATA:  PE suspected, low/intermediate prob, positive D-dimer EXAM: CT ANGIOGRAPHY CHEST WITH CONTRAST TECHNIQUE: Multidetector CT imaging of the chest was performed using the standard protocol during bolus administration of intravenous contrast. Multiplanar CT image reconstructions and MIPs were obtained to evaluate the  vascular anatomy. CONTRAST:  153mL OMNIPAQUE IOHEXOL 350 MG/ML SOLN COMPARISON:  None. FINDINGS: Cardiovascular: Satisfactory opacification of the pulmonary arteries to the segmental level. No evidence of pulmonary embolism. Coronary artery and aortic atherosclerosis (ICD10-I70.0). The small pericardial effusion. Mediastinum/Nodes: Borderline enlarged right paratracheal and left AP window lymph nodes. Lungs/Pleura: Numerous rounded pulmonary nodules throughout both lungs. Index nodule in the left upper lobe measures 1.9 cm on series 6, image 23. Index lesion in the right upper lobe measures 1.4 cm on series 6, image 26. Small left greater than right pleural effusions. No evidence of cavitation. Small left greater than right pleural effusions. No pneumothorax. Upper Abdomen: Ill-defined area of hypoattenuation within the lateral left hepatic lobe (series 4, image 110). Incompletely characterized retropharyngeal lymph nodes. Musculoskeletal: Degenerative changes of the thoracic spine with flowing anterior osteophytes. Lytic or blastic  lesions identified. No evidence of acute fracture. Review of the MIP images confirms the above findings. IMPRESSION: 1. No evidence of acute pulmonary embolism. 2. Numerous round pulmonary nodules throughout both lungs, highly concerning for pulmonary metastases. 3. Borderline enlarged right paratracheal and left AP window lymph nodes, indeterminate for nodal metastases. PET CT could provide more definitive nodal staging if clinically indicated. 4. Ill-defined area of hypoattenuation within the lateral left hepatic lobe and incompletely characterized retroperitoneal nodes. Given the above findings, recommend CT abdomen/pelvis with contrast to further characterize and to assess for a primary malignancy. 5. Small left greater than right pleural effusions. Electronically Signed   By: Margaretha Sheffield M.D.   On: 06/24/2021 14:01   CT ABDOMEN PELVIS W CONTRAST  Result Date: 06/24/2021 CLINICAL DATA:  Metastatic disease. EXAM: CT ABDOMEN AND PELVIS WITH CONTRAST TECHNIQUE: Multidetector CT imaging of the abdomen and pelvis was performed using the standard protocol following bolus administration of intravenous contrast. CONTRAST:  57mL OMNIPAQUE IOHEXOL 300 MG/ML  SOLN COMPARISON:  May 24, 2013. FINDINGS: Lower chest: Multiple rounded masses are noted in the visualized lung bases consistent with metastatic disease. Hepatobiliary: No gallstones or biliary dilatation is noted. Nodular hepatic contours are noted suggesting hepatic cirrhosis. Ill-defined low density is noted in left hepatic lobe anteriorly which may represent metastatic lesion. 4.7 cm low density is noted in caudate lobe concerning for metastatic disease. Pancreas: Unremarkable. No pancreatic ductal dilatation or surrounding inflammatory changes. Spleen: Multiple small calcified splenic granulomata are noted. Adrenals/Urinary Tract: Adrenal glands appear normal. Right renal cyst is noted. No hydronephrosis or renal obstruction is noted. Urinary  bladder is unremarkable. Stomach/Bowel: Stomach is within normal limits. Appendix appears normal. No evidence of bowel wall thickening, distention, or inflammatory changes. Vascular/Lymphatic: Aortic atherosclerosis. Several lymph nodes are noted in the portal caval region the largest measuring 1 cm. Reproductive: Severe prostatic enlargement is noted. Other: No abdominal wall hernia or abnormality. No abdominopelvic ascites. Musculoskeletal: No acute or significant osseous findings. IMPRESSION: Multiple pulmonary metastases are noted in the visualized lung bases. Hepatic cirrhosis is noted. 4.7 cm low density is noted in caudate lobe concerning for metastatic lesion. Ill-defined density is noted anteriorly in the left hepatic lobe which may also represent metastatic disease. Several enlarged lymph nodes are noted in the portacaval region, the largest measuring 1 cm. Severe prostatic enlargement is noted. Aortic Atherosclerosis (ICD10-I70.0). Electronically Signed   By: Marijo Conception M.D.   On: 06/24/2021 16:53   DG Chest Port 1 View  Result Date: 06/29/2021 CLINICAL DATA:  Possible sepsis, initial encounter EXAM: PORTABLE CHEST 1 VIEW COMPARISON:  06/24/2021 FINDINGS: Inspiratory effort is slightly improved. Cardiac  shadow is stable. No focal infiltrate or effusion is noted. No acute bony abnormality is seen. IMPRESSION: No active disease. Electronically Signed   By: Inez Catalina M.D.   On: 06/29/2021 23:44   (Echo, Carotid, EGD, Colonoscopy, ERCP)    Subjective:  Feels better   Discharge Exam: Vitals:   07/02/21 0709 07/02/21 1120  BP: 120/71 (!) 126/53  Pulse: 72 (!) 59  Resp: 18 20  Temp: 97.7 F (36.5 C) 97.6 F (36.4 C)  SpO2: 96% 97%   Vitals:   07/01/21 1526 07/01/21 2100 07/02/21 0709 07/02/21 1120  BP: 134/86 125/72 120/71 (!) 126/53  Pulse: 70 68 72 (!) 59  Resp: 18  18 20   Temp: 98 F (36.7 C) 97.7 F (36.5 C) 97.7 F (36.5 C) 97.6 F (36.4 C)  TempSrc: Oral Oral Oral  Oral  SpO2: 96% 95% 96% 97%    General: Pt is alert, awake, not in acute distress Cardiovascular: RRR, S1/S2 +, no rubs, no gallops Respiratory: few rhonchi bilaterally, no wheezing, no rhonchi Abdominal: Soft, NT, ND, bowel sounds + Extremities: no edema, no cyanosis    The results of significant diagnostics from this hospitalization (including imaging, microbiology, ancillary and laboratory) are listed below for reference.     Microbiology: Recent Results (from the past 240 hour(s))  Resp Panel by RT-PCR (Flu A&B, Covid) Nasopharyngeal Swab     Status: None   Collection Time: 06/24/21 11:25 AM   Specimen: Nasopharyngeal Swab; Nasopharyngeal(NP) swabs in vial transport medium  Result Value Ref Range Status   SARS Coronavirus 2 by RT PCR NEGATIVE NEGATIVE Final    Comment: (NOTE) SARS-CoV-2 target nucleic acids are NOT DETECTED.  The SARS-CoV-2 RNA is generally detectable in upper respiratory specimens during the acute phase of infection. The lowest concentration of SARS-CoV-2 viral copies this assay can detect is 138 copies/mL. A negative result does not preclude SARS-Cov-2 infection and should not be used as the sole basis for treatment or other patient management decisions. A negative result may occur with  improper specimen collection/handling, submission of specimen other than nasopharyngeal swab, presence of viral mutation(s) within the areas targeted by this assay, and inadequate number of viral copies(<138 copies/mL). A negative result must be combined with clinical observations, patient history, and epidemiological information. The expected result is Negative.  Fact Sheet for Patients:  EntrepreneurPulse.com.au  Fact Sheet for Healthcare Providers:  IncredibleEmployment.be  This test is no t yet approved or cleared by the [place] FDA and  has been authorized for detection and/or diagnosis of SARS-CoV-2 by FDA under an  Emergency Use Authorization (EUA). This EUA will remain  in effect (meaning this test can be used) for the duration of the COVID-19 declaration under Section 564(b)(1) of the Act, 21 U.S.C.section 360bbb-3(b)(1), unless the authorization is terminated  or revoked sooner.       Influenza A by PCR NEGATIVE NEGATIVE Final   Influenza B by PCR NEGATIVE NEGATIVE Final    Comment: (NOTE) The Xpert Xpress SARS-CoV-2/FLU/RSV plus assay is intended as an aid in the diagnosis of influenza from Nasopharyngeal swab specimens and should not be used as a sole basis for treatment. Nasal washings and aspirates are unacceptable for Xpert Xpress SARS-CoV-2/FLU/RSV testing.  Fact Sheet for Patients: EntrepreneurPulse.com.au  Fact Sheet for Healthcare Providers: IncredibleEmployment.be  This test is not yet approved or cleared by the [place] FDA and has been authorized for detection and/or diagnosis of SARS-CoV-2 by FDA under an Emergency Use Authorization (EUA). This  EUA will remain in effect (meaning this test can be used) for the duration of the COVID-19 declaration under Section 564(b)(1) of the Act, 21 U.S.C. section 360bbb-3(b)(1), unless the authorization is terminated or revoked.  Performed at [facility], [address] [place], [place] [address]   Resp Panel by RT-PCR (Flu A&B, Covid) Nasopharyngeal Swab     Status: Abnormal   Collection Time: 06/29/21 11:15 PM   Specimen: Nasopharyngeal Swab; Nasopharyngeal(NP) swabs in vial transport medium  Result Value Ref Range Status   SARS Coronavirus 2 by RT PCR POSITIVE (A) NEGATIVE Final    Comment: RESULT CALLED TO, READ BACK BY AND VERIFIED WITH: KISER,C RN @0035  ON 06/30/21 JACKSON,K (NOTE) SARS-CoV-2 target nucleic acids are DETECTED.  The SARS-CoV-2 RNA is generally detectable in upper respiratory specimens during the acute phase of infection. Positive results  are indicative of the presence of the identified virus, but do not rule out bacterial infection or co-infection with other pathogens not detected by the test. Clinical correlation with patient history and other diagnostic information is necessary to determine patient infection status. The expected result is Negative.  Fact Sheet for Patients: EntrepreneurPulse.com.au  Fact Sheet for Healthcare Providers: IncredibleEmployment.be  This test is not yet approved or cleared by the [place] FDA and  has been authorized for detection and/or diagnosis of SARS-CoV-2 by FDA under an Emergency Use Authorization (EUA).  This EUA will remain in effect (meaning this test ca n be used) for the duration of  the COVID-19 declaration under Section 564(b)(1) of the Act, 21 U.S.C. section 360bbb-3(b)(1), unless the authorization is terminated or revoked sooner.     Influenza A by PCR NEGATIVE NEGATIVE Final   Influenza B by PCR NEGATIVE NEGATIVE Final    Comment: (NOTE) The Xpert Xpress SARS-CoV-2/FLU/RSV plus assay is intended as an aid in the diagnosis of influenza from Nasopharyngeal swab specimens and should not be used as a sole basis for treatment. Nasal washings and aspirates are unacceptable for Xpert Xpress SARS-CoV-2/FLU/RSV testing.  Fact Sheet for Patients: EntrepreneurPulse.com.au  Fact Sheet for Healthcare Providers: IncredibleEmployment.be  This test is not yet approved or cleared by the [place] FDA and has been authorized for detection and/or diagnosis of SARS-CoV-2 by FDA under an Emergency Use Authorization (EUA). This EUA will remain in effect (meaning this test can be used) for the duration of the COVID-19 declaration under Section 564(b)(1) of the Act, 21 U.S.C. section 360bbb-3(b)(1), unless the authorization is terminated or revoked.  Performed at [facility], [place]  [address] [place], [place] [address]   Blood Culture (routine x 2)     Status: None (Preliminary result)   Collection Time: 06/29/21 11:15 PM   Specimen: BLOOD  Result Value Ref Range Status   Specimen Description   Final    BLOOD BLOOD LEFT FOREARM Performed at [facility] [address] [place], [place] [address]    Special Requests   Final    BOTTLES DRAWN AEROBIC AND ANAEROBIC Blood Culture adequate volume Performed at [facility] [address] [place], [place] [address]    Culture   Final    NO GROWTH 2 DAYS Performed at [facility] [address], [place], [place] [address]    Report Status PENDING  Incomplete  Urine Culture     Status: Abnormal   Collection Time: 06/29/21 11:15 PM   Specimen: Urine, Clean Catch  Result Value Ref Range Status  Specimen Description   Final    URINE, CLEAN CATCH Performed at [facility], [place] [address], [place], [place] [address]    Special Requests   Final    NONE Performed at [facility], [place] [address] [place], [place] [address]    Culture (A)  Final    <10,000 COLONIES/mL INSIGNIFICANT GROWTH Performed at [facility] [address], [place], [place] [address]    Report Status 07/01/2021 FINAL  Final  Blood Culture (routine x 2)     Status: None (Preliminary result)   Collection Time: 06/29/21 11:20 PM   Specimen: BLOOD  Result Value Ref Range Status   Specimen Description   Final    BLOOD RIGHT ANTECUBITAL Performed at [facility] [address] [place], [place] [address]    Special Requests   Final    BOTTLES DRAWN AEROBIC AND ANAEROBIC Blood Culture adequate volume Performed at [facility] [address], [place], [place] [address]    Culture   Final    NO GROWTH 2 DAYS Performed at [facility] [address], [place], [place] [address]    Report Status PENDING   Incomplete     Labs: BNP (last 3 results) Recent Labs    06/24/21 1125 06/29/21 2316  BNP 39.2 299.3*   Basic Metabolic Panel: Recent Labs  Lab 06/29/21 2315 07/01/21 0442 07/02/21 0500  NA 136 138 135  K 4.2 4.1 4.7  CL 103 103 98  CO2 26 31 29   GLUCOSE 168* 125* 200*  BUN 13 20 21   CREATININE 0.56* 0.97 0.68  CALCIUM 8.3* 8.1* 8.3*   Liver Function Tests: Recent Labs  Lab 06/29/21 2315 07/01/21 0442 07/02/21 0500  AST 24 28 27   ALT 23 26 26   ALKPHOS 149* 145* 144*  BILITOT 0.9 0.6 0.5  PROT 6.9 6.1* 6.2*  ALBUMIN 3.2* 3.0* 2.8*   No results for input(s): LIPASE, AMYLASE in the last 168 hours. No results for input(s): AMMONIA in the last 168 hours. CBC: Recent Labs  Lab 06/29/21 2315 07/01/21 0442 07/02/21 0500  WBC 4.9 4.8 3.3*  NEUTROABS 3.7 2.9 2.7  HGB 12.7* 12.1* 12.6*  HCT 37.9* 36.7* 37.1*  MCV 94.8 96.8 93.9  PLT 116* 98* 90*   Cardiac Enzymes: No results for input(s): CKTOTAL, CKMB, CKMBINDEX, TROPONINI in the last 168 hours. BNP: Invalid input(s): POCBNP CBG: Recent Labs  Lab 07/01/21 1123 07/01/21 1657 07/01/21 2136 07/02/21 0801 07/02/21 1122  GLUCAP 196* 213* 208* 200* 182*   D-Dimer No results for input(s): DDIMER in the last 72 hours. Hgb A1c Recent Labs    06/30/21 0250 07/01/21 0442  HGBA1C 6.0* 6.2*   Lipid Profile No results for input(s): CHOL, HDL, LDLCALC, TRIG, CHOLHDL, LDLDIRECT in the last 72 hours. Thyroid function studies No results for input(s): TSH, T4TOTAL, T3FREE, THYROIDAB in the last 72 hours.  Invalid input(s): FREET3 Anemia work up No results for input(s): VITAMINB12, FOLATE, FERRITIN, TIBC, IRON, RETICCTPCT in the last 72 hours. Urinalysis    Component Value Date/Time   COLORURINE YELLOW 06/29/2021 2315   APPEARANCEUR CLEAR 06/29/2021 2315   LABSPEC 1.019 06/29/2021 2315   PHURINE 5.0 06/29/2021 2315   GLUCOSEU NEGATIVE 06/29/2021 2315   HGBUR MODERATE (A) 06/29/2021 2315   BILIRUBINUR  NEGATIVE 06/29/2021 2315   BILIRUBINUR Negative 03/25/2018 0902   KETONESUR 5 (A) 06/29/2021 2315   PROTEINUR NEGATIVE 06/29/2021 2315   UROBILINOGEN 0.2 03/25/2018  2423   NITRITE NEGATIVE 06/29/2021 2315   LEUKOCYTESUR NEGATIVE 06/29/2021 2315   Sepsis Labs Invalid input(s): PROCALCITONIN,  WBC,  LACTICIDVEN Microbiology Recent Results (from the past 240 hour(s))  Resp Panel by RT-PCR (Flu A&B, Covid) Nasopharyngeal Swab     Status: None   Collection Time: 06/24/21 11:25 AM   Specimen: Nasopharyngeal Swab; Nasopharyngeal(NP) swabs in vial transport medium  Result Value Ref Range Status   SARS Coronavirus 2 by RT PCR NEGATIVE NEGATIVE Final    Comment: (NOTE) SARS-CoV-2 target nucleic acids are NOT DETECTED.  The SARS-CoV-2 RNA is generally detectable in upper respiratory specimens during the acute phase of infection. The lowest concentration of SARS-CoV-2 viral copies this assay can detect is 138 copies/mL. A negative result does not preclude SARS-Cov-2 infection and should not be used as the sole basis for treatment or other patient management decisions. A negative result may occur with  improper specimen collection/handling, submission of specimen other than nasopharyngeal swab, presence of viral mutation(s) within the areas targeted by this assay, and inadequate number of viral copies(<138 copies/mL). A negative result must be combined with clinical observations, patient history, and epidemiological information. The expected result is Negative.  Fact Sheet for Patients:  EntrepreneurPulse.com.au  Fact Sheet for Healthcare Providers:  IncredibleEmployment.be  This test is no t yet approved or cleared by the [place] FDA and  has been authorized for detection and/or diagnosis of SARS-CoV-2 by FDA under an Emergency Use Authorization (EUA). This EUA will remain  in effect (meaning this test can be used) for the duration of  the COVID-19 declaration under Section 564(b)(1) of the Act, 21 U.S.C.section 360bbb-3(b)(1), unless the authorization is terminated  or revoked sooner.       Influenza A by PCR NEGATIVE NEGATIVE Final   Influenza B by PCR NEGATIVE NEGATIVE Final    Comment: (NOTE) The Xpert Xpress SARS-CoV-2/FLU/RSV plus assay is intended as an aid in the diagnosis of influenza from Nasopharyngeal swab specimens and should not be used as a sole basis for treatment. Nasal washings and aspirates are unacceptable for Xpert Xpress SARS-CoV-2/FLU/RSV testing.  Fact Sheet for Patients: EntrepreneurPulse.com.au  Fact Sheet for Healthcare Providers: IncredibleEmployment.be  This test is not yet approved or cleared by the [place] FDA and has been authorized for detection and/or diagnosis of SARS-CoV-2 by FDA under an Emergency Use Authorization (EUA). This EUA will remain in effect (meaning this test can be used) for the duration of the COVID-19 declaration under Section 564(b)(1) of the Act, 21 U.S.C. section 360bbb-3(b)(1), unless the authorization is terminated or revoked.  Performed at [facility], [address], [place], [place] [address]   Resp Panel by RT-PCR (Flu A&B, Covid) Nasopharyngeal Swab     Status: Abnormal   Collection Time: 06/29/21 11:15 PM   Specimen: Nasopharyngeal Swab; Nasopharyngeal(NP) swabs in vial transport medium  Result Value Ref Range Status   SARS Coronavirus 2 by RT PCR POSITIVE (A) NEGATIVE Final    Comment: RESULT CALLED TO, READ BACK BY AND VERIFIED WITH: KISER,C RN @0035  ON 06/30/21 JACKSON,K (NOTE) SARS-CoV-2 target nucleic acids are DETECTED.  The SARS-CoV-2 RNA is generally detectable in upper respiratory specimens during the acute phase of infection. Positive results are indicative of the presence of the identified virus, but do not rule out bacterial infection or co-infection with other  pathogens not detected by the test. Clinical correlation with patient history and other diagnostic information is necessary to determine patient infection status. The expected result is  Negative.  Fact Sheet for Patients: EntrepreneurPulse.com.au  Fact Sheet for Healthcare Providers: IncredibleEmployment.be  This test is not yet approved or cleared by the [place] FDA and  has been authorized for detection and/or diagnosis of SARS-CoV-2 by FDA under an Emergency Use Authorization (EUA).  This EUA will remain in effect (meaning this test ca n be used) for the duration of  the COVID-19 declaration under Section 564(b)(1) of the Act, 21 U.S.C. section 360bbb-3(b)(1), unless the authorization is terminated or revoked sooner.     Influenza A by PCR NEGATIVE NEGATIVE Final   Influenza B by PCR NEGATIVE NEGATIVE Final    Comment: (NOTE) The Xpert Xpress SARS-CoV-2/FLU/RSV plus assay is intended as an aid in the diagnosis of influenza from Nasopharyngeal swab specimens and should not be used as a sole basis for treatment. Nasal washings and aspirates are unacceptable for Xpert Xpress SARS-CoV-2/FLU/RSV testing.  Fact Sheet for Patients: EntrepreneurPulse.com.au  Fact Sheet for Healthcare Providers: IncredibleEmployment.be  This test is not yet approved or cleared by the [place] FDA and has been authorized for detection and/or diagnosis of SARS-CoV-2 by FDA under an Emergency Use Authorization (EUA). This EUA will remain in effect (meaning this test can be used) for the duration of the COVID-19 declaration under Section 564(b)(1) of the Act, 21 U.S.C. section 360bbb-3(b)(1), unless the authorization is terminated or revoked.  Performed at [facility], [place] [address], [place], [place] [address]   Blood Culture (routine x 2)     Status: None (Preliminary result)   Collection Time:  06/29/21 11:15 PM   Specimen: BLOOD  Result Value Ref Range Status   Specimen Description   Final    BLOOD BLOOD LEFT FOREARM Performed at [place] [address] [place], [place] [address]    Special Requests   Final    BOTTLES DRAWN AEROBIC AND ANAEROBIC Blood Culture adequate volume Performed at [facility] [address], [place], [place] [address]    Culture   Final    NO GROWTH 2 DAYS Performed at [facility] [address], [place], [place] [address]    Report Status PENDING  Incomplete  Urine Culture     Status: Abnormal   Collection Time: 06/29/21 11:15 PM   Specimen: Urine, Clean Catch  Result Value Ref Range Status   Specimen Description   Final    URINE, CLEAN CATCH Performed at [facility], [place] [address] [place], [place] [address]    Special Requests   Final    NONE Performed at [facility], [place] [address], [place], [place] [address]    Culture (A)  Final    <10,000 COLONIES/mL INSIGNIFICANT GROWTH Performed at [facility] [address], [place], [place] [address]    Report Status 07/01/2021 FINAL  Final  Blood Culture (routine x 2)     Status: None (Preliminary result)   Collection Time: 06/29/21 11:20 PM   Specimen: BLOOD  Result Value Ref Range Status   Specimen Description   Final    BLOOD RIGHT ANTECUBITAL Performed at [facility] [place] [address], [place], [place] [address]    Special Requests   Final    BOTTLES DRAWN AEROBIC AND ANAEROBIC Blood Culture adequate volume Performed at [facility] [address], [place], [place] [address]    Culture   Final    NO GROWTH 2 DAYS Performed at [facility], [place] [address], [place], [place] [address]    Report Status PENDING  Incomplete     Time coordinating discharge: 39 minutes  SIGNED:  Georgette Shell, MD  [facility] 07/02/2021, 3:45  PM

## 2021-07-02 NOTE — Progress Notes (Signed)
SATURATION QUALIFICATIONS: (This note is used to comply with regulatory documentation for home oxygen)  Patient Saturations on Room Air at Rest = 90%  Patient Saturations on Room Air while Ambulating = 82%  Patient Saturations on 2 Liters of oxygen while Ambulating = 96%  Please briefly explain why patient needs home oxygen: Pt baseline O2 was 2L at home. Pt continues to require 2L of O2 while ambulating. Pt ambulated in room (42ft) with one person assistance, and front wheel walker. Pt complained of SOB during ambulation. After pt complained of SOB, this RN sat patient chair and remained with pt until SOB resolved.

## 2021-07-03 ENCOUNTER — Telehealth: Payer: Self-pay | Admitting: Pharmacist

## 2021-07-03 ENCOUNTER — Telehealth: Payer: Self-pay

## 2021-07-03 DIAGNOSIS — Z9981 Dependence on supplemental oxygen: Secondary | ICD-10-CM | POA: Diagnosis not present

## 2021-07-03 DIAGNOSIS — R000 Tachycardia, unspecified: Secondary | ICD-10-CM | POA: Diagnosis not present

## 2021-07-03 DIAGNOSIS — Z7952 Long term (current) use of systemic steroids: Secondary | ICD-10-CM | POA: Diagnosis not present

## 2021-07-03 DIAGNOSIS — K5730 Diverticulosis of large intestine without perforation or abscess without bleeding: Secondary | ICD-10-CM | POA: Diagnosis not present

## 2021-07-03 DIAGNOSIS — N400 Enlarged prostate without lower urinary tract symptoms: Secondary | ICD-10-CM | POA: Diagnosis not present

## 2021-07-03 DIAGNOSIS — U071 COVID-19: Secondary | ICD-10-CM | POA: Diagnosis not present

## 2021-07-03 DIAGNOSIS — Z96651 Presence of right artificial knee joint: Secondary | ICD-10-CM | POA: Diagnosis not present

## 2021-07-03 DIAGNOSIS — I119 Hypertensive heart disease without heart failure: Secondary | ICD-10-CM | POA: Diagnosis not present

## 2021-07-03 DIAGNOSIS — N319 Neuromuscular dysfunction of bladder, unspecified: Secondary | ICD-10-CM | POA: Diagnosis not present

## 2021-07-03 DIAGNOSIS — J9811 Atelectasis: Secondary | ICD-10-CM | POA: Diagnosis not present

## 2021-07-03 DIAGNOSIS — J9621 Acute and chronic respiratory failure with hypoxia: Secondary | ICD-10-CM | POA: Diagnosis not present

## 2021-07-03 DIAGNOSIS — I082 Rheumatic disorders of both aortic and tricuspid valves: Secondary | ICD-10-CM | POA: Diagnosis not present

## 2021-07-03 DIAGNOSIS — Q6101 Congenital single renal cyst: Secondary | ICD-10-CM | POA: Diagnosis not present

## 2021-07-03 DIAGNOSIS — Z86010 Personal history of colonic polyps: Secondary | ICD-10-CM | POA: Diagnosis not present

## 2021-07-03 DIAGNOSIS — Z794 Long term (current) use of insulin: Secondary | ICD-10-CM | POA: Diagnosis not present

## 2021-07-03 DIAGNOSIS — Z955 Presence of coronary angioplasty implant and graft: Secondary | ICD-10-CM | POA: Diagnosis not present

## 2021-07-03 DIAGNOSIS — D696 Thrombocytopenia, unspecified: Secondary | ICD-10-CM | POA: Diagnosis not present

## 2021-07-03 DIAGNOSIS — I2510 Atherosclerotic heart disease of native coronary artery without angina pectoris: Secondary | ICD-10-CM | POA: Diagnosis not present

## 2021-07-03 DIAGNOSIS — E782 Mixed hyperlipidemia: Secondary | ICD-10-CM | POA: Diagnosis not present

## 2021-07-03 DIAGNOSIS — E662 Morbid (severe) obesity with alveolar hypoventilation: Secondary | ICD-10-CM | POA: Diagnosis not present

## 2021-07-03 DIAGNOSIS — R918 Other nonspecific abnormal finding of lung field: Secondary | ICD-10-CM | POA: Diagnosis not present

## 2021-07-03 DIAGNOSIS — K7460 Unspecified cirrhosis of liver: Secondary | ICD-10-CM | POA: Diagnosis not present

## 2021-07-03 DIAGNOSIS — Z87442 Personal history of urinary calculi: Secondary | ICD-10-CM | POA: Diagnosis not present

## 2021-07-03 DIAGNOSIS — E1169 Type 2 diabetes mellitus with other specified complication: Secondary | ICD-10-CM | POA: Diagnosis not present

## 2021-07-03 DIAGNOSIS — Z6835 Body mass index (BMI) 35.0-35.9, adult: Secondary | ICD-10-CM | POA: Diagnosis not present

## 2021-07-03 NOTE — Chronic Care Management (AMB) (Addendum)
Chronic Care Management Pharmacy Assistant   Name: Jacob Bishop  MRN: [id number] DOB: May 30, 1941   Reason for Encounter: Medication Review/ Medication Coordination Call.    Recent office visits:  06/14/21 Jacob Peng NP (PCP) - seen for uncontrolled type 2 diabetes mellitus with hyperglycemia and other issues. Patient started on bupropion HCL 150mg  daily. Follow up in a month or sooner.   Recent consult visits:  06/27/21 Jacob Edison NP (Pulmonology) - seen for multiple lung nodules on CT and other issues. Referral for DME placed. No medication changes. Follow up with Dr. Elsworth Bishop in 2 weeks. Follow up sooner if symptoms do not improve.  Hospital visits:  Medication Reconciliation was completed by comparing discharge summary, patient's EMR and Pharmacy list, and upon discussion with patient.  Admitted to the hospital on 06/29/21 due to Acute on chronic respiratory failure with Hypoxia. Discharge date was 07/02/21. Discharged from [facility]?Medications Started at [facility] Discharge:?? -started Dexamethasone  Medication Changes at Hospital Discharge: -Changed None.  Medications Discontinued at Hospital Discharge: -Stopped fluticasone, ketoconazole 2%, levofloxacin and omeprazole.  Medications that remain the same after Hospital Discharge:??  -All other medications will remain the same.      Medication Reconciliation was completed by comparing discharge summary, patient's EMR and Pharmacy list, and upon discussion with patient.  Patient visited [name] on 06/24/21 for 9 hours due to Acute respiratory failure with Hypoxia.  New?Medications Started at [facility] Discharge:?? -started None.  Medication Changes at Hospital Discharge: -Changed None.  Medications Discontinued at Hospital Discharge: -Stopped None.  Medications that remain the same after Hospital Discharge:??  -All other medications will remain the  same.   Medications: Outpatient Encounter Medications as of 07/03/2021  Medication Sig   albuterol (VENTOLIN HFA) 108 (90 Base) MCG/ACT inhaler Inhale 2 puffs into the lungs every 6 (six) hours as needed for wheezing or shortness of breath.   buPROPion (WELLBUTRIN XL) 150 MG 24 hr tablet Take 1 tablet (150 mg total) by mouth daily.   dexamethasone (DECADRON) 6 MG tablet Take 1 tablet (6 mg total) by mouth daily.   Insulin Glargine (BASAGLAR KWIKPEN) 100 UNIT/ML INJECT 0.4 MLS (40 UNITS TOTAL) INTO THE SKIN AT BEDTIME. (Patient taking differently: Inject 40 Units into the skin at bedtime. INJECT 0.4 MLS (40 UNITS TOTAL) INTO THE SKIN AT BEDTIME.)   Insulin Pen Needle (BD PEN NEEDLE NANO U/F) 32G X 4 MM MISC USE AS DIRECTED TWICE A DAY   meclizine (ANTIVERT) 12.5 MG tablet TAKE ONE TABLET BY MOUTH three times daily AS NEEDED FOR dizziness (Patient taking differently: Take 12.5 mg by mouth 3 (three) times daily as needed.)   oxybutynin (DITROPAN-XL) 10 MG 24 hr tablet Take 10 mg by mouth daily.   simvastatin (ZOCOR) 20 MG tablet Take 1 tablet (20 mg total) by mouth daily at 6 PM.   tamsulosin (FLOMAX) 0.4 MG CAPS capsule Take 0.4 mg by mouth 2 (two) times daily.   No facility-administered encounter medications on file as of 07/03/2021.   Fill History: ALBUTEROL HFA 90 MCG INHALER 06/14/2021 30   meclizine 12.5 mg tablet 06/18/2021 30   oxybutynin chloride 10 mg tablet extended release 24hr 07/01/2021 30   simvastatin 20 mg tablet 06/05/2021 30   tamsulosin 0.4 mg capsule 06/05/2021 30   bupropion HCl XL 150 mg 24 hr tablet, extended release 06/17/2021 27   Reviewed chart for medication changes ahead of medication coordination call.  No OVs,  Consults, or hospital visits since last care coordination call/Pharmacist visit. (If appropriate, list visit date, provider name)  No medication changes indicated OR if recent visit, treatment plan here.  BP Readings from Last 3 Encounters:   07/02/21 (!) 126/53  06/27/21 (!) 108/58  06/24/21 (!) 158/88    Lab Results  Component Value Date   HGBA1C 6.2 (H) 07/01/2021     Patient obtains medications through Vials  30 Days   Last adherence delivery included:  Simvastatin (ZOCOR) 20 mg: one tablet daily with evening meal. Tamsulosin 0.4 mg: one capsule in the morning with breakfast and one capsule at bedtime Oxybutynin 10 mg XL: one tablet in the morning with breakfast.  Patient declined medications below: Agricultural engineer (patient assistance)  Meclizine 12.5 mg tablet: one tablet 3 times daily as needed (vials) Flonase nasal spray: two sprays in each nostril daily as needed (PRN) Doxycycline 100mg  - take 1 tablet at breakfast and 1 tablet with evening meal  Patient is due for next adherence delivery on: 07/11/21. Called patient and reviewed medications and coordinated delivery.  This delivery to include: Simvastatin (ZOCOR) 20 mg: one tablet daily with evening meal Tamsulosin 0.4 mg: one capsule in the morning with breakfast and one capsule at bedtime Oxybutynin 10 mg XL: one tablet in the morning with breakfast Bupropion XL 150 mg: one tablet daily   Patient declined the following medications: Agricultural engineer (patient assistance)  Meclizine 12.5 mg tablet: one tablet 3 times daily as needed (vials) Flonase nasal spray: two sprays in each nostril daily as needed (PRN) Doxycycline 100mg  - take 1 tablet at breakfast and 1 tablet with evening meal  Electronic refill requests sent for meclizine and oxybutynin by [facility].  Confirmed delivery date of 07/11/21, advised patient that pharmacy will contact them the morning of delivery.   Care Gaps:  AWV - completed 02/04/21 Hepatitis Cscreening - never done. Tetanus/TDAP - never done. Zoster vaccines [facility]) - never done. Urine Microalbumin - overdue since 09/04/2016 Colonoscopy - overdue since 09/01/2018 Covid-19 vaccine- booster 4 overdue since 09/25/20  Star  Rating Drugs:  Simvastatin 20mg  - last filled on 06/05/21 30DS at [place] [phone]

## 2021-07-03 NOTE — Telephone Encounter (Signed)
Transition Care Management Follow-up Telephone Call Date of discharge and from where: 07/02/2021 Jacob Bishop How have you been since you were released from the hospital? Doing better, but have a ways to go Any questions or concerns? No  Items Reviewed: Did the pt receive and understand the discharge instructions provided? Yes  Medications obtained and verified? Yes  Other? No  Any new allergies since your discharge? No  Dietary orders reviewed? Yes Do you have support at home? Yes   Home Care and Equipment/Supplies: Were home health services ordered? yes If so, what is the name of the agency? [facility]  Has the agency set up a time to come to the patient's home? yes Were any new equipment or medical supplies ordered?  No What is the name of the medical supply agency? N/a Were you able to get the supplies/equipment? not applicable Do you have any questions related to the use of the equipment or supplies? No  Functional Questionnaire: (I = Independent and D = Dependent) ADLs: I  Bathing/Dressing- I  Meal Prep- I  Eating- I  Maintaining continence- I  Transferring/Ambulation- I  Managing Meds- I  Follow up appointments reviewed:  PCP Hospital f/u appt confirmed? Yes  Scheduled to see Jacob Peng NP on 07/09/2021 @ 1:00p. Are transportation arrangements needed? No  If their condition worsens, is the pt aware to call PCP or go to the Emergency Dept.? Yes Was the patient provided with contact information for the PCP's office or ED? Yes Was to pt encouraged to call back with questions or concerns? Yes

## 2021-07-04 ENCOUNTER — Telehealth: Payer: Self-pay | Admitting: Adult Health

## 2021-07-04 DIAGNOSIS — U071 COVID-19: Secondary | ICD-10-CM | POA: Diagnosis not present

## 2021-07-04 DIAGNOSIS — J9621 Acute and chronic respiratory failure with hypoxia: Secondary | ICD-10-CM | POA: Diagnosis not present

## 2021-07-04 DIAGNOSIS — E782 Mixed hyperlipidemia: Secondary | ICD-10-CM | POA: Diagnosis not present

## 2021-07-04 DIAGNOSIS — I119 Hypertensive heart disease without heart failure: Secondary | ICD-10-CM | POA: Diagnosis not present

## 2021-07-04 DIAGNOSIS — I2510 Atherosclerotic heart disease of native coronary artery without angina pectoris: Secondary | ICD-10-CM | POA: Diagnosis not present

## 2021-07-04 DIAGNOSIS — E1169 Type 2 diabetes mellitus with other specified complication: Secondary | ICD-10-CM | POA: Diagnosis not present

## 2021-07-04 NOTE — Telephone Encounter (Signed)
Jacob Bishop from [place]

## 2021-07-04 NOTE — Telephone Encounter (Signed)
Jacob Bishop from [facility] call and stated he need a stat order for pt because pt want to go to a skill Nursing facility.Jacob Bishop stated he need this order so the social worker can evaluate pt.

## 2021-07-04 NOTE — Telephone Encounter (Signed)
Verbal orders given to [facility] on confidential vm.

## 2021-07-04 NOTE — Telephone Encounter (Signed)
Please advise 

## 2021-07-05 ENCOUNTER — Telehealth: Payer: Self-pay | Admitting: Adult Health

## 2021-07-05 ENCOUNTER — Encounter (HOSPITAL_COMMUNITY): Admission: RE | Admit: 2021-07-05 | Payer: Medicare Other | Source: Ambulatory Visit

## 2021-07-05 DIAGNOSIS — U071 COVID-19: Secondary | ICD-10-CM | POA: Diagnosis not present

## 2021-07-05 DIAGNOSIS — E1169 Type 2 diabetes mellitus with other specified complication: Secondary | ICD-10-CM | POA: Diagnosis not present

## 2021-07-05 DIAGNOSIS — E782 Mixed hyperlipidemia: Secondary | ICD-10-CM | POA: Diagnosis not present

## 2021-07-05 DIAGNOSIS — I119 Hypertensive heart disease without heart failure: Secondary | ICD-10-CM | POA: Diagnosis not present

## 2021-07-05 DIAGNOSIS — I2510 Atherosclerotic heart disease of native coronary artery without angina pectoris: Secondary | ICD-10-CM | POA: Diagnosis not present

## 2021-07-05 DIAGNOSIS — J9621 Acute and chronic respiratory failure with hypoxia: Secondary | ICD-10-CM | POA: Diagnosis not present

## 2021-07-05 LAB — CULTURE, BLOOD (ROUTINE X 2)
Culture: NO GROWTH
Culture: NO GROWTH
Special Requests: ADEQUATE
Special Requests: ADEQUATE

## 2021-07-05 NOTE — Telephone Encounter (Signed)
Please advise 

## 2021-07-05 NOTE — Telephone Encounter (Signed)
Claire Shown, a Education officer, museum with [facility] called to let office know that she spoke with daughter and patient needs to go to a rehab asap and they need a FL2 form done. Jackelyn Poling would like to know if Tommi Rumps would complete this.    Good callback number is [phone]     Please advise

## 2021-07-05 NOTE — Telephone Encounter (Signed)
Called number to speak to [facility] but no answer. Lm for Debbie to return call.

## 2021-07-08 ENCOUNTER — Other Ambulatory Visit: Payer: Self-pay | Admitting: Adult Health

## 2021-07-08 ENCOUNTER — Telehealth: Payer: Self-pay

## 2021-07-08 DIAGNOSIS — H8113 Benign paroxysmal vertigo, bilateral: Secondary | ICD-10-CM

## 2021-07-08 DIAGNOSIS — E1169 Type 2 diabetes mellitus with other specified complication: Secondary | ICD-10-CM | POA: Diagnosis not present

## 2021-07-08 DIAGNOSIS — I2510 Atherosclerotic heart disease of native coronary artery without angina pectoris: Secondary | ICD-10-CM | POA: Diagnosis not present

## 2021-07-08 DIAGNOSIS — E782 Mixed hyperlipidemia: Secondary | ICD-10-CM | POA: Diagnosis not present

## 2021-07-08 DIAGNOSIS — J9621 Acute and chronic respiratory failure with hypoxia: Secondary | ICD-10-CM | POA: Diagnosis not present

## 2021-07-08 DIAGNOSIS — I119 Hypertensive heart disease without heart failure: Secondary | ICD-10-CM | POA: Diagnosis not present

## 2021-07-08 DIAGNOSIS — U071 COVID-19: Secondary | ICD-10-CM | POA: Diagnosis not present

## 2021-07-08 NOTE — Telephone Encounter (Signed)
Okay for verbal orders? Please advise 

## 2021-07-08 NOTE — Telephone Encounter (Signed)
Spoke to [place] and she stated she will send the forms over. Per Jackelyn Poling pt family is requesting for pt to be admitted in a skill nursing facility. Debbie stated since pt was dx with Covid 07/01/2021 pt will have to wait 10 days. Debbie also wanted me to articulate to [facility] that pt has been sleeping in a recliner for 2 years. Family reported that pt is not getting up to use the restroom and does not have much movement.

## 2021-07-08 NOTE — Telephone Encounter (Signed)
Jacob Bishop from [facility] called asking for verbal orders occupational therapy  1 wk -4 0 wk - 1 2 wk - 1 Adl's, transfer and exercise Callback # [phone]

## 2021-07-08 NOTE — Telephone Encounter (Signed)
Beth home health nurse called for verbal orders  Frequency 2 x 1 week 1 x 6 weeks  Patient complains of shortness of breath, weak, abnormal heart rate and it was noted that during ambulance ride EKG strip showed Afib also B/p readings Left 170/70 and Right 160/70  Call back # [phone]

## 2021-07-09 ENCOUNTER — Inpatient Hospital Stay: Payer: Medicare Other | Admitting: Adult Health

## 2021-07-09 DIAGNOSIS — J9621 Acute and chronic respiratory failure with hypoxia: Secondary | ICD-10-CM | POA: Diagnosis not present

## 2021-07-09 DIAGNOSIS — I119 Hypertensive heart disease without heart failure: Secondary | ICD-10-CM | POA: Diagnosis not present

## 2021-07-09 DIAGNOSIS — E782 Mixed hyperlipidemia: Secondary | ICD-10-CM | POA: Diagnosis not present

## 2021-07-09 DIAGNOSIS — U071 COVID-19: Secondary | ICD-10-CM | POA: Diagnosis not present

## 2021-07-09 DIAGNOSIS — I2510 Atherosclerotic heart disease of native coronary artery without angina pectoris: Secondary | ICD-10-CM | POA: Diagnosis not present

## 2021-07-09 DIAGNOSIS — E1169 Type 2 diabetes mellitus with other specified complication: Secondary | ICD-10-CM | POA: Diagnosis not present

## 2021-07-09 NOTE — Telephone Encounter (Signed)
Okay for verbal orders? Please advise 

## 2021-07-09 NOTE — Progress Notes (Deleted)
   Subjective:    Patient ID: Jacob Bishop, male    DOB: 1940-10-06, 80 y.o.   MRN: [id number]  HPI 80 year old male who  has a past medical history of AI (aortic insufficiency) (01/11/2018), Arthritis, CAD in native artery, Cellulitis, Dermatitis, Diabetes mellitus, Diastolic dysfunction (52/84/1324), Diverticulosis (01/31/2016), DOE (dyspnea on exertion), Edema, Fatty liver (09/18/2004), History of kidney stones (05/23/2002), colonic polyps (01/31/2016), Hyperlipidemia, Hypertension, Internal hemorrhoids (01/31/2016), Obesity, OSA (obstructive sleep apnea), Positional vertigo, Pulmonary hypertension ([place]) (01/11/2018), Rhinosinusitis, Skin lesion, and TR (tricuspid regurgitation) (01/11/2018).  He presents to the office today for TCM visit   Admit Date 06/29/2021 Discharge Date 07/02/2021  Was admitted with acute on chronic hypoxia secondary to COVID-19 infection.  He has a past medical history significant for OSA on CPAP at home.  He was started on 2 L of oxygen at home on October 31, obesity, type 2 diabetes, hypertension, thrombocytopenia, and hyperlipidemia.  He also has a history of lung nodules and liver lesions.  He is being worked up by pulmonary he has an outpatient PET scan scheduled.  On admission he was tachycardic, temp was 99.4, he was placed on 4 L of oxygen.  He was saturating 89% on 4 L on admission and currently 89% on 4 L.  His chest x-ray showed no active disease.  Patient reported shortness of breath, dyspnea on exertion, and generalized weakness  Hospital Course   Acute on chronic hypoxic respiratory failure due to COVID-19 infection -Treated with remdesivir for 3 days and steroids.  He was given a prescription for Decadron for 7 additional days.  He was asked to continue on 2 L of oxygen at home.  Type 2 diabetes -Started long-acting insulin -1C6.2 on admission  Obstructive sleep apnea -Continue CPAP at home  Pulmonary and liver lesions -Being worked up by pulmonary  outpatient with PET scan pending  Hyperlipidemia -Continue on statin  Overactive bladder -Continue on oxybutynin   Review of Systems     Objective:   Physical Exam        Assessment & Plan:

## 2021-07-09 NOTE — Telephone Encounter (Signed)
Verbal orders given to [facility] via confidential vm.

## 2021-07-10 DIAGNOSIS — U071 COVID-19: Secondary | ICD-10-CM | POA: Diagnosis not present

## 2021-07-10 DIAGNOSIS — E1169 Type 2 diabetes mellitus with other specified complication: Secondary | ICD-10-CM | POA: Diagnosis not present

## 2021-07-10 DIAGNOSIS — E782 Mixed hyperlipidemia: Secondary | ICD-10-CM | POA: Diagnosis not present

## 2021-07-10 DIAGNOSIS — I2510 Atherosclerotic heart disease of native coronary artery without angina pectoris: Secondary | ICD-10-CM | POA: Diagnosis not present

## 2021-07-10 DIAGNOSIS — I119 Hypertensive heart disease without heart failure: Secondary | ICD-10-CM | POA: Diagnosis not present

## 2021-07-10 DIAGNOSIS — J9621 Acute and chronic respiratory failure with hypoxia: Secondary | ICD-10-CM | POA: Diagnosis not present

## 2021-07-10 NOTE — Telephone Encounter (Signed)
Called Debbie for Fax number no answer. Lm to return call.

## 2021-07-11 ENCOUNTER — Other Ambulatory Visit: Payer: Self-pay | Admitting: Adult Health

## 2021-07-11 DIAGNOSIS — J9621 Acute and chronic respiratory failure with hypoxia: Secondary | ICD-10-CM | POA: Diagnosis not present

## 2021-07-11 DIAGNOSIS — I2510 Atherosclerotic heart disease of native coronary artery without angina pectoris: Secondary | ICD-10-CM | POA: Diagnosis not present

## 2021-07-11 DIAGNOSIS — I119 Hypertensive heart disease without heart failure: Secondary | ICD-10-CM | POA: Diagnosis not present

## 2021-07-11 DIAGNOSIS — J189 Pneumonia, unspecified organism: Secondary | ICD-10-CM

## 2021-07-11 DIAGNOSIS — U071 COVID-19: Secondary | ICD-10-CM | POA: Diagnosis not present

## 2021-07-11 DIAGNOSIS — E1169 Type 2 diabetes mellitus with other specified complication: Secondary | ICD-10-CM | POA: Diagnosis not present

## 2021-07-11 DIAGNOSIS — E782 Mixed hyperlipidemia: Secondary | ICD-10-CM | POA: Diagnosis not present

## 2021-07-11 NOTE — Telephone Encounter (Signed)
Jacob Bishop was given VO as well

## 2021-07-11 NOTE — Telephone Encounter (Signed)
Spoke to beth abd she stated pt has not seen Cardio in a while. Beth stated that pt last pulse was at 60 but it sounded irregular. Cory advise of update and stated that we will wait until pt come in to his appt. On 07/16/2021 to advise.

## 2021-07-11 NOTE — Telephone Encounter (Signed)
Fax number received [phone]. PPW faxed!

## 2021-07-11 NOTE — Telephone Encounter (Signed)
Please advise 

## 2021-07-11 NOTE — Telephone Encounter (Signed)
Jacob Bishop is calling again and the pt is still having irregular heart rate and pt feels more weak

## 2021-07-12 DIAGNOSIS — E782 Mixed hyperlipidemia: Secondary | ICD-10-CM | POA: Diagnosis not present

## 2021-07-12 DIAGNOSIS — I2510 Atherosclerotic heart disease of native coronary artery without angina pectoris: Secondary | ICD-10-CM | POA: Diagnosis not present

## 2021-07-12 DIAGNOSIS — I119 Hypertensive heart disease without heart failure: Secondary | ICD-10-CM | POA: Diagnosis not present

## 2021-07-12 DIAGNOSIS — E1169 Type 2 diabetes mellitus with other specified complication: Secondary | ICD-10-CM | POA: Diagnosis not present

## 2021-07-12 DIAGNOSIS — U071 COVID-19: Secondary | ICD-10-CM | POA: Diagnosis not present

## 2021-07-12 DIAGNOSIS — J9621 Acute and chronic respiratory failure with hypoxia: Secondary | ICD-10-CM | POA: Diagnosis not present

## 2021-07-15 ENCOUNTER — Telehealth: Payer: Self-pay | Admitting: Pulmonary Disease

## 2021-07-15 ENCOUNTER — Ambulatory Visit: Payer: Medicare Other | Admitting: Pulmonary Disease

## 2021-07-15 DIAGNOSIS — J9621 Acute and chronic respiratory failure with hypoxia: Secondary | ICD-10-CM | POA: Diagnosis not present

## 2021-07-15 DIAGNOSIS — U071 COVID-19: Secondary | ICD-10-CM | POA: Diagnosis not present

## 2021-07-15 DIAGNOSIS — I2510 Atherosclerotic heart disease of native coronary artery without angina pectoris: Secondary | ICD-10-CM | POA: Diagnosis not present

## 2021-07-15 DIAGNOSIS — E1169 Type 2 diabetes mellitus with other specified complication: Secondary | ICD-10-CM | POA: Diagnosis not present

## 2021-07-15 DIAGNOSIS — E782 Mixed hyperlipidemia: Secondary | ICD-10-CM | POA: Diagnosis not present

## 2021-07-15 DIAGNOSIS — I119 Hypertensive heart disease without heart failure: Secondary | ICD-10-CM | POA: Diagnosis not present

## 2021-07-15 NOTE — Telephone Encounter (Signed)
I called the daughter and she reports that she does not have any way to get the patient evaluated at the ER. The daughter did want to start the process for the PET scan again and then we can have him come back in the office to go over the results of the PET scan.   I will get this form faxed once I have a date for the scan. Nothing further needed.

## 2021-07-16 ENCOUNTER — Telehealth: Payer: Self-pay

## 2021-07-16 ENCOUNTER — Encounter: Payer: Self-pay | Admitting: Adult Health

## 2021-07-16 ENCOUNTER — Ambulatory Visit (INDEPENDENT_AMBULATORY_CARE_PROVIDER_SITE_OTHER): Payer: Medicare Other | Admitting: Adult Health

## 2021-07-16 VITALS — Ht 71.0 in | Wt 254.0 lb

## 2021-07-16 DIAGNOSIS — E782 Mixed hyperlipidemia: Secondary | ICD-10-CM | POA: Diagnosis not present

## 2021-07-16 DIAGNOSIS — J9611 Chronic respiratory failure with hypoxia: Secondary | ICD-10-CM | POA: Diagnosis not present

## 2021-07-16 DIAGNOSIS — R918 Other nonspecific abnormal finding of lung field: Secondary | ICD-10-CM | POA: Diagnosis not present

## 2021-07-16 DIAGNOSIS — U071 COVID-19: Secondary | ICD-10-CM | POA: Diagnosis not present

## 2021-07-16 DIAGNOSIS — R160 Hepatomegaly, not elsewhere classified: Secondary | ICD-10-CM | POA: Diagnosis not present

## 2021-07-16 DIAGNOSIS — I119 Hypertensive heart disease without heart failure: Secondary | ICD-10-CM | POA: Diagnosis not present

## 2021-07-16 DIAGNOSIS — L89159 Pressure ulcer of sacral region, unspecified stage: Secondary | ICD-10-CM

## 2021-07-16 DIAGNOSIS — I2510 Atherosclerotic heart disease of native coronary artery without angina pectoris: Secondary | ICD-10-CM | POA: Diagnosis not present

## 2021-07-16 DIAGNOSIS — F339 Major depressive disorder, recurrent, unspecified: Secondary | ICD-10-CM | POA: Diagnosis not present

## 2021-07-16 DIAGNOSIS — J9621 Acute and chronic respiratory failure with hypoxia: Secondary | ICD-10-CM | POA: Diagnosis not present

## 2021-07-16 DIAGNOSIS — E1169 Type 2 diabetes mellitus with other specified complication: Secondary | ICD-10-CM | POA: Diagnosis not present

## 2021-07-16 MED ORDER — CAREX COCCYX CUSHION MISC: 0 refills | Status: DC

## 2021-07-16 NOTE — Progress Notes (Signed)
Virtual Visit via Telephone Note  I connected with Jacob Bishop on 07/16/21 at 10:30 AM EST by telephone and verified that I am speaking with the correct person using two identifiers.   I discussed the limitations, risks, security and privacy concerns of performing an evaluation and management service by telephone and the availability of in person appointments. I also discussed with the patient that there may be a patient responsible charge related to this service. The patient expressed understanding and agreed to proceed.  Location patient: home Location provider: work or home office Participants present for the call: provider and patients wife Patient did not have a visit in the prior 7 days to address this/these issue(s).   History of Present Illness: 80 year old male who  has a past medical history of AI (aortic insufficiency) (01/11/2018), Arthritis, CAD in native artery, Cellulitis, Dermatitis, Diabetes mellitus, Diastolic dysfunction (54/00/8676), Diverticulosis (01/31/2016), DOE (dyspnea on exertion), Edema, Fatty liver (09/18/2004), History of kidney stones (05/23/2002), colonic polyps (01/31/2016), Hyperlipidemia, Hypertension, Internal hemorrhoids (01/31/2016), Obesity, OSA (obstructive sleep apnea), Positional vertigo, Pulmonary hypertension ([employer]) (01/11/2018), Rhinosinusitis, Skin lesion, and TR (tricuspid regurgitation) (01/11/2018).  This appointment was with patient's wife as the patient did not have the strength to come to the phone.  This was originally supposed to be a 1 month follow-up after starting Wellbutrin for depression.  His wife reports that since starting Wellbutrin 150 mg twice daily that his mood has improved significantly.  He has not developed any side effects from the medication and would like to continue this dose.  Since he was last seen he was admitted to the hospital on 06/29/2021 for 3 night stay due to acute respiratory failure from COVID-19 infection.   Continues to be on 2 L of oxygen at home 24 hours a day.  His wife reports that some days better than the others but he continues to feel pretty weak and has not getting out of his chair.  Home health RN is coming to the house and he started physical therapy.  They are hoping for placement into acute rehab facility.  I did fill out the paperwork for this late last week so this should be in the works.  His wife reports that he has some pressure sores on his buttocks from sitting in his chair, home health RN continues to evaluate this and asked that I send in a cushion for him to sit on.  Incidentally, and during his hospital admission he was noted to have multiple pulmonary metastasis and a possible new metastatic lesion on his liver.  He also had several enlarged lymph nodes in the portal caval region and severe prostate enlargement.  He has no previous cancer diagnosis.  He has been scheduled for a PET scan on 07/25/2021.   Observations/Objective: Patient sounds cheerful and well on the phone. I do not appreciate any SOB. Speech and thought processing are grossly intact. Patient reported vitals:  Assessment and Plan: 1. Depression, recurrent ([place]) - Glad he is feeling better in regards to depression. No change in dose   2. Multiple lung nodules on CT - Follow up with pulmonary and for PET scan   3. Liver masses - Follow up with PET scan   4. Chronic respiratory failure with hypoxia (HCC) - Continue with oxygen  - Follow up with pulmonary as directed  5. Pressure injury of skin of sacral region, unspecified injury stage  - Misc. Devices (CAREX COCCYX CUSHION) MISC; Use when sitting in chair or  couch  Dispense: 1 each; Refill: 0   Follow Up Instructions:  DIAGMED@   [id number] 5-10 [id number] 11-20 9443 21-30 I did not refer this patient for an OV in the next 24 hours for this/these issue(s).  I discussed the assessment and treatment plan with the patient. The patient was provided an  opportunity to ask questions and all were answered. The patient agreed with the plan and demonstrated an understanding of the instructions.   The patient was advised to call back or seek an in-person evaluation if the symptoms worsen or if the condition fails to improve as anticipated.  I provided 28 minutes of non-face-to-face time during this encounter.   Jacob Peng, NP

## 2021-07-16 NOTE — Telephone Encounter (Signed)
  Patient's wife called to change appt to virtual because she can not get patient into the office by herself

## 2021-07-16 NOTE — Telephone Encounter (Signed)
Noted  

## 2021-07-22 DIAGNOSIS — I119 Hypertensive heart disease without heart failure: Secondary | ICD-10-CM | POA: Diagnosis not present

## 2021-07-22 DIAGNOSIS — E782 Mixed hyperlipidemia: Secondary | ICD-10-CM | POA: Diagnosis not present

## 2021-07-22 DIAGNOSIS — E1169 Type 2 diabetes mellitus with other specified complication: Secondary | ICD-10-CM | POA: Diagnosis not present

## 2021-07-22 DIAGNOSIS — J9621 Acute and chronic respiratory failure with hypoxia: Secondary | ICD-10-CM | POA: Diagnosis not present

## 2021-07-22 DIAGNOSIS — U071 COVID-19: Secondary | ICD-10-CM | POA: Diagnosis not present

## 2021-07-22 DIAGNOSIS — I2510 Atherosclerotic heart disease of native coronary artery without angina pectoris: Secondary | ICD-10-CM | POA: Diagnosis not present

## 2021-07-23 DIAGNOSIS — E782 Mixed hyperlipidemia: Secondary | ICD-10-CM | POA: Diagnosis not present

## 2021-07-23 DIAGNOSIS — I119 Hypertensive heart disease without heart failure: Secondary | ICD-10-CM | POA: Diagnosis not present

## 2021-07-23 DIAGNOSIS — I2510 Atherosclerotic heart disease of native coronary artery without angina pectoris: Secondary | ICD-10-CM | POA: Diagnosis not present

## 2021-07-23 DIAGNOSIS — J9621 Acute and chronic respiratory failure with hypoxia: Secondary | ICD-10-CM | POA: Diagnosis not present

## 2021-07-23 DIAGNOSIS — E1169 Type 2 diabetes mellitus with other specified complication: Secondary | ICD-10-CM | POA: Diagnosis not present

## 2021-07-23 DIAGNOSIS — U071 COVID-19: Secondary | ICD-10-CM | POA: Diagnosis not present

## 2021-07-24 ENCOUNTER — Inpatient Hospital Stay (HOSPITAL_COMMUNITY)
Admission: EM | Admit: 2021-07-24 | Discharge: 2021-08-25 | DRG: 175 | Disposition: A | Discharge status: Hospice | Payer: Medicare Other | Attending: Internal Medicine | Admitting: Internal Medicine

## 2021-07-24 ENCOUNTER — Telehealth: Payer: Self-pay | Admitting: Pulmonary Disease

## 2021-07-24 ENCOUNTER — Encounter (HOSPITAL_COMMUNITY): Payer: Self-pay

## 2021-07-24 ENCOUNTER — Other Ambulatory Visit: Payer: Self-pay

## 2021-07-24 ENCOUNTER — Telehealth: Payer: Self-pay | Admitting: Adult Health

## 2021-07-24 ENCOUNTER — Emergency Department (HOSPITAL_COMMUNITY): Payer: Medicare Other

## 2021-07-24 DIAGNOSIS — C7800 Secondary malignant neoplasm of unspecified lung: Secondary | ICD-10-CM | POA: Diagnosis present

## 2021-07-24 DIAGNOSIS — Z20822 Contact with and (suspected) exposure to covid-19: Secondary | ICD-10-CM | POA: Diagnosis not present

## 2021-07-24 DIAGNOSIS — I071 Rheumatic tricuspid insufficiency: Secondary | ICD-10-CM | POA: Diagnosis present

## 2021-07-24 DIAGNOSIS — I110 Hypertensive heart disease with heart failure: Secondary | ICD-10-CM | POA: Diagnosis present

## 2021-07-24 DIAGNOSIS — Z87442 Personal history of urinary calculi: Secondary | ICD-10-CM

## 2021-07-24 DIAGNOSIS — R0600 Dyspnea, unspecified: Secondary | ICD-10-CM

## 2021-07-24 DIAGNOSIS — R918 Other nonspecific abnormal finding of lung field: Secondary | ICD-10-CM

## 2021-07-24 DIAGNOSIS — I2510 Atherosclerotic heart disease of native coronary artery without angina pectoris: Secondary | ICD-10-CM | POA: Diagnosis not present

## 2021-07-24 DIAGNOSIS — E782 Mixed hyperlipidemia: Secondary | ICD-10-CM | POA: Diagnosis not present

## 2021-07-24 DIAGNOSIS — I3139 Other pericardial effusion (noninflammatory): Secondary | ICD-10-CM | POA: Diagnosis not present

## 2021-07-24 DIAGNOSIS — I82411 Acute embolism and thrombosis of right femoral vein: Secondary | ICD-10-CM | POA: Diagnosis present

## 2021-07-24 DIAGNOSIS — I959 Hypotension, unspecified: Secondary | ICD-10-CM | POA: Diagnosis not present

## 2021-07-24 DIAGNOSIS — G4733 Obstructive sleep apnea (adult) (pediatric): Secondary | ICD-10-CM | POA: Diagnosis not present

## 2021-07-24 DIAGNOSIS — I2693 Single subsegmental pulmonary embolism without acute cor pulmonale: Principal | ICD-10-CM

## 2021-07-24 DIAGNOSIS — R079 Chest pain, unspecified: Secondary | ICD-10-CM | POA: Diagnosis not present

## 2021-07-24 DIAGNOSIS — L89156 Pressure-induced deep tissue damage of sacral region: Secondary | ICD-10-CM | POA: Diagnosis not present

## 2021-07-24 DIAGNOSIS — R0689 Other abnormalities of breathing: Secondary | ICD-10-CM | POA: Diagnosis not present

## 2021-07-24 DIAGNOSIS — M8580 Other specified disorders of bone density and structure, unspecified site: Secondary | ICD-10-CM | POA: Diagnosis present

## 2021-07-24 DIAGNOSIS — Z794 Long term (current) use of insulin: Secondary | ICD-10-CM

## 2021-07-24 DIAGNOSIS — Z515 Encounter for palliative care: Secondary | ICD-10-CM

## 2021-07-24 DIAGNOSIS — K769 Liver disease, unspecified: Secondary | ICD-10-CM

## 2021-07-24 DIAGNOSIS — Z955 Presence of coronary angioplasty implant and graft: Secondary | ICD-10-CM

## 2021-07-24 DIAGNOSIS — D638 Anemia in other chronic diseases classified elsewhere: Secondary | ICD-10-CM | POA: Diagnosis not present

## 2021-07-24 DIAGNOSIS — Z87891 Personal history of nicotine dependence: Secondary | ICD-10-CM

## 2021-07-24 DIAGNOSIS — I5189 Other ill-defined heart diseases: Secondary | ICD-10-CM | POA: Diagnosis not present

## 2021-07-24 DIAGNOSIS — Z8719 Personal history of other diseases of the digestive system: Secondary | ICD-10-CM

## 2021-07-24 DIAGNOSIS — E871 Hypo-osmolality and hyponatremia: Secondary | ICD-10-CM | POA: Diagnosis present

## 2021-07-24 DIAGNOSIS — L89312 Pressure ulcer of right buttock, stage 2: Secondary | ICD-10-CM | POA: Diagnosis not present

## 2021-07-24 DIAGNOSIS — Z66 Do not resuscitate: Secondary | ICD-10-CM | POA: Diagnosis not present

## 2021-07-24 DIAGNOSIS — L89322 Pressure ulcer of left buttock, stage 2: Secondary | ICD-10-CM | POA: Diagnosis present

## 2021-07-24 DIAGNOSIS — I5033 Acute on chronic diastolic (congestive) heart failure: Secondary | ICD-10-CM | POA: Diagnosis not present

## 2021-07-24 DIAGNOSIS — Z79899 Other long term (current) drug therapy: Secondary | ICD-10-CM

## 2021-07-24 DIAGNOSIS — E2740 Unspecified adrenocortical insufficiency: Secondary | ICD-10-CM | POA: Diagnosis not present

## 2021-07-24 DIAGNOSIS — F419 Anxiety disorder, unspecified: Secondary | ICD-10-CM | POA: Diagnosis present

## 2021-07-24 DIAGNOSIS — F32A Depression, unspecified: Secondary | ICD-10-CM

## 2021-07-24 DIAGNOSIS — I351 Nonrheumatic aortic (valve) insufficiency: Secondary | ICD-10-CM | POA: Diagnosis present

## 2021-07-24 DIAGNOSIS — Z8249 Family history of ischemic heart disease and other diseases of the circulatory system: Secondary | ICD-10-CM

## 2021-07-24 DIAGNOSIS — I517 Cardiomegaly: Secondary | ICD-10-CM | POA: Diagnosis not present

## 2021-07-24 DIAGNOSIS — Z96651 Presence of right artificial knee joint: Secondary | ICD-10-CM | POA: Diagnosis present

## 2021-07-24 DIAGNOSIS — E1165 Type 2 diabetes mellitus with hyperglycemia: Secondary | ICD-10-CM | POA: Diagnosis present

## 2021-07-24 DIAGNOSIS — Z7901 Long term (current) use of anticoagulants: Secondary | ICD-10-CM

## 2021-07-24 DIAGNOSIS — Z86010 Personal history of colonic polyps: Secondary | ICD-10-CM

## 2021-07-24 DIAGNOSIS — Z6833 Body mass index (BMI) 33.0-33.9, adult: Secondary | ICD-10-CM

## 2021-07-24 DIAGNOSIS — I10 Essential (primary) hypertension: Secondary | ICD-10-CM | POA: Diagnosis present

## 2021-07-24 DIAGNOSIS — C801 Malignant (primary) neoplasm, unspecified: Secondary | ICD-10-CM | POA: Diagnosis present

## 2021-07-24 DIAGNOSIS — C787 Secondary malignant neoplasm of liver and intrahepatic bile duct: Secondary | ICD-10-CM | POA: Diagnosis not present

## 2021-07-24 DIAGNOSIS — R160 Hepatomegaly, not elsewhere classified: Secondary | ICD-10-CM | POA: Diagnosis not present

## 2021-07-24 DIAGNOSIS — R911 Solitary pulmonary nodule: Secondary | ICD-10-CM | POA: Diagnosis not present

## 2021-07-24 DIAGNOSIS — Z885 Allergy status to narcotic agent status: Secondary | ICD-10-CM

## 2021-07-24 DIAGNOSIS — I2699 Other pulmonary embolism without acute cor pulmonale: Secondary | ICD-10-CM | POA: Diagnosis not present

## 2021-07-24 DIAGNOSIS — R0602 Shortness of breath: Secondary | ICD-10-CM

## 2021-07-24 DIAGNOSIS — J9600 Acute respiratory failure, unspecified whether with hypoxia or hypercapnia: Secondary | ICD-10-CM | POA: Diagnosis not present

## 2021-07-24 DIAGNOSIS — J90 Pleural effusion, not elsewhere classified: Secondary | ICD-10-CM | POA: Diagnosis not present

## 2021-07-24 DIAGNOSIS — J9690 Respiratory failure, unspecified, unspecified whether with hypoxia or hypercapnia: Secondary | ICD-10-CM | POA: Diagnosis not present

## 2021-07-24 DIAGNOSIS — J9601 Acute respiratory failure with hypoxia: Secondary | ICD-10-CM | POA: Diagnosis not present

## 2021-07-24 DIAGNOSIS — Z888 Allergy status to other drugs, medicaments and biological substances status: Secondary | ICD-10-CM

## 2021-07-24 DIAGNOSIS — K7689 Other specified diseases of liver: Secondary | ICD-10-CM | POA: Diagnosis not present

## 2021-07-24 DIAGNOSIS — D696 Thrombocytopenia, unspecified: Secondary | ICD-10-CM | POA: Diagnosis not present

## 2021-07-24 DIAGNOSIS — Z7401 Bed confinement status: Secondary | ICD-10-CM | POA: Diagnosis not present

## 2021-07-24 DIAGNOSIS — J9622 Acute and chronic respiratory failure with hypercapnia: Secondary | ICD-10-CM | POA: Diagnosis present

## 2021-07-24 DIAGNOSIS — K7460 Unspecified cirrhosis of liver: Secondary | ICD-10-CM | POA: Diagnosis present

## 2021-07-24 DIAGNOSIS — R0902 Hypoxemia: Secondary | ICD-10-CM | POA: Diagnosis not present

## 2021-07-24 DIAGNOSIS — I272 Pulmonary hypertension, unspecified: Secondary | ICD-10-CM | POA: Diagnosis present

## 2021-07-24 DIAGNOSIS — Z801 Family history of malignant neoplasm of trachea, bronchus and lung: Secondary | ICD-10-CM

## 2021-07-24 DIAGNOSIS — J9621 Acute and chronic respiratory failure with hypoxia: Secondary | ICD-10-CM | POA: Diagnosis present

## 2021-07-24 DIAGNOSIS — M1990 Unspecified osteoarthritis, unspecified site: Secondary | ICD-10-CM | POA: Diagnosis present

## 2021-07-24 DIAGNOSIS — J9811 Atelectasis: Secondary | ICD-10-CM | POA: Diagnosis not present

## 2021-07-24 DIAGNOSIS — I2602 Saddle embolus of pulmonary artery with acute cor pulmonale: Secondary | ICD-10-CM | POA: Diagnosis not present

## 2021-07-24 DIAGNOSIS — C779 Secondary and unspecified malignant neoplasm of lymph node, unspecified: Secondary | ICD-10-CM | POA: Diagnosis present

## 2021-07-24 DIAGNOSIS — R059 Cough, unspecified: Secondary | ICD-10-CM | POA: Diagnosis not present

## 2021-07-24 DIAGNOSIS — E119 Type 2 diabetes mellitus without complications: Secondary | ICD-10-CM | POA: Diagnosis not present

## 2021-07-24 DIAGNOSIS — Z8616 Personal history of COVID-19: Secondary | ICD-10-CM

## 2021-07-24 DIAGNOSIS — E1169 Type 2 diabetes mellitus with other specified complication: Secondary | ICD-10-CM | POA: Diagnosis not present

## 2021-07-24 DIAGNOSIS — C799 Secondary malignant neoplasm of unspecified site: Secondary | ICD-10-CM

## 2021-07-24 DIAGNOSIS — I119 Hypertensive heart disease without heart failure: Secondary | ICD-10-CM | POA: Diagnosis not present

## 2021-07-24 DIAGNOSIS — T380X5A Adverse effect of glucocorticoids and synthetic analogues, initial encounter: Secondary | ICD-10-CM | POA: Diagnosis present

## 2021-07-24 DIAGNOSIS — J189 Pneumonia, unspecified organism: Secondary | ICD-10-CM | POA: Diagnosis not present

## 2021-07-24 DIAGNOSIS — R531 Weakness: Secondary | ICD-10-CM | POA: Diagnosis not present

## 2021-07-24 DIAGNOSIS — Z7189 Other specified counseling: Secondary | ICD-10-CM | POA: Diagnosis not present

## 2021-07-24 DIAGNOSIS — Z833 Family history of diabetes mellitus: Secondary | ICD-10-CM

## 2021-07-24 DIAGNOSIS — R319 Hematuria, unspecified: Secondary | ICD-10-CM | POA: Diagnosis not present

## 2021-07-24 DIAGNOSIS — I4891 Unspecified atrial fibrillation: Secondary | ICD-10-CM | POA: Diagnosis not present

## 2021-07-24 DIAGNOSIS — E669 Obesity, unspecified: Secondary | ICD-10-CM | POA: Diagnosis present

## 2021-07-24 DIAGNOSIS — I824Z1 Acute embolism and thrombosis of unspecified deep veins of right distal lower extremity: Secondary | ICD-10-CM

## 2021-07-24 DIAGNOSIS — M2550 Pain in unspecified joint: Secondary | ICD-10-CM | POA: Diagnosis not present

## 2021-07-24 DIAGNOSIS — U071 COVID-19: Secondary | ICD-10-CM | POA: Diagnosis not present

## 2021-07-24 DIAGNOSIS — I361 Nonrheumatic tricuspid (valve) insufficiency: Secondary | ICD-10-CM | POA: Diagnosis not present

## 2021-07-24 DIAGNOSIS — R162 Hepatomegaly with splenomegaly, not elsewhere classified: Secondary | ICD-10-CM | POA: Diagnosis present

## 2021-07-24 DIAGNOSIS — L8990 Pressure ulcer of unspecified site, unspecified stage: Secondary | ICD-10-CM | POA: Insufficient documentation

## 2021-07-24 DIAGNOSIS — Z86711 Personal history of pulmonary embolism: Secondary | ICD-10-CM

## 2021-07-24 LAB — BLOOD GAS, VENOUS
Acid-Base Excess: 6.7 mmol/L — ABNORMAL HIGH (ref 0.0–2.0)
Bicarbonate: 33.5 mmol/L — ABNORMAL HIGH (ref 20.0–28.0)
O2 Saturation: 31.8 %
Patient temperature: 98.6
pCO2, Ven: 60.6 mmHg — ABNORMAL HIGH (ref 44.0–60.0)
pH, Ven: 7.362 (ref 7.250–7.430)
pO2, Ven: <31 mmHg — CL (ref 32.0–45.0)

## 2021-07-24 LAB — PROTIME-INR
INR: 1.2 (ref 0.8–1.2)
Prothrombin Time: 15.4 seconds — ABNORMAL HIGH (ref 11.4–15.2)

## 2021-07-24 LAB — COMPREHENSIVE METABOLIC PANEL
ALT: 30 U/L (ref 0–44)
AST: 24 U/L (ref 15–41)
Albumin: 3.1 g/dL — ABNORMAL LOW (ref 3.5–5.0)
Alkaline Phosphatase: 206 U/L — ABNORMAL HIGH (ref 38–126)
Anion gap: 10 (ref 5–15)
BUN: 12 mg/dL (ref 8–23)
CO2: 32 mmol/L (ref 22–32)
Calcium: 8.4 mg/dL — ABNORMAL LOW (ref 8.9–10.3)
Chloride: 93 mmol/L — ABNORMAL LOW (ref 98–111)
Creatinine, Ser: 0.89 mg/dL (ref 0.61–1.24)
GFR, Estimated: >60 mL/min (ref >60)
Glucose, Bld: 143 mg/dL — ABNORMAL HIGH (ref 70–99)
Potassium: 3.6 mmol/L (ref 3.5–5.1)
Sodium: 135 mmol/L (ref 135–145)
Total Bilirubin: 1.5 mg/dL — ABNORMAL HIGH (ref 0.3–1.2)
Total Protein: 7.3 g/dL (ref 6.5–8.1)

## 2021-07-24 LAB — CBC WITH DIFFERENTIAL/PLATELET
Abs Immature Granulocytes: 0.06 10*3/uL (ref 0.00–0.07)
Basophils Absolute: 0 10*3/uL (ref 0.0–0.1)
Basophils Relative: 0 %
Eosinophils Absolute: 0 10*3/uL (ref 0.0–0.5)
Eosinophils Relative: 0 %
HCT: 39.7 % (ref 39.0–52.0)
Hemoglobin: 13.2 g/dL (ref 13.0–17.0)
Immature Granulocytes: 1 %
Lymphocytes Relative: 12 %
Lymphs Abs: 0.8 10*3/uL (ref 0.7–4.0)
MCH: 31.4 pg (ref 26.0–34.0)
MCHC: 33.2 g/dL (ref 30.0–36.0)
MCV: 94.3 fL (ref 80.0–100.0)
Monocytes Absolute: 0.6 10*3/uL (ref 0.1–1.0)
Monocytes Relative: 8 %
Neutro Abs: 5.5 10*3/uL (ref 1.7–7.7)
Neutrophils Relative %: 79 %
Platelets: 118 10*3/uL — ABNORMAL LOW (ref 150–400)
RBC: 4.21 MIL/uL — ABNORMAL LOW (ref 4.22–5.81)
RDW: 13.4 % (ref 11.5–15.5)
WBC: 7 10*3/uL (ref 4.0–10.5)
nRBC: 0 % (ref 0.0–0.2)

## 2021-07-24 LAB — RESP PANEL BY RT-PCR (FLU A&B, COVID) ARPGX2
Influenza A by PCR: NEGATIVE
Influenza B by PCR: NEGATIVE
SARS Coronavirus 2 by RT PCR: NEGATIVE

## 2021-07-24 LAB — CBG MONITORING, ED: Glucose-Capillary: 133 mg/dL — ABNORMAL HIGH (ref 70–99)

## 2021-07-24 LAB — LACTIC ACID, PLASMA: Lactic Acid, Venous: 1.4 mmol/L (ref 0.5–1.9)

## 2021-07-24 LAB — APTT: aPTT: 27 seconds (ref 24–36)

## 2021-07-24 MED ORDER — IOHEXOL 350 MG/ML SOLN
100.0000 mL | Freq: Once | INTRAVENOUS | Status: AC | PRN
  Administered 2021-07-24: 100 mL via INTRAVENOUS

## 2021-07-24 MED ORDER — ACETAMINOPHEN 650 MG RE SUPP: 650.0000 mg | Freq: Four times a day (QID) | RECTAL | Status: DC | PRN

## 2021-07-24 MED ORDER — POLYETHYLENE GLYCOL 3350 17 G PO PACK
17.0000 g | PACK | Freq: Every day | ORAL | Status: DC | PRN
  Administered 2021-07-27 – 2021-08-17 (×6): 17 g via ORAL
  Filled 2021-07-24 (×6): qty 1

## 2021-07-24 MED ORDER — TAMSULOSIN HCL 0.4 MG PO CAPS
0.4000 mg | ORAL_CAPSULE | Freq: Two times a day (BID) | ORAL | Status: DC
  Administered 2021-07-25 – 2021-08-25 (×61): 0.4 mg via ORAL
  Filled 2021-07-24 (×61): qty 1

## 2021-07-24 MED ORDER — ALBUTEROL SULFATE (2.5 MG/3ML) 0.083% IN NEBU
3.0000 mL | INHALATION_SOLUTION | Freq: Four times a day (QID) | RESPIRATORY_TRACT | Status: DC | PRN
  Administered 2021-07-28: 20:00:00 3 mL via RESPIRATORY_TRACT
  Filled 2021-07-24: qty 3

## 2021-07-24 MED ORDER — ACETAMINOPHEN 325 MG PO TABS
650.0000 mg | ORAL_TABLET | Freq: Four times a day (QID) | ORAL | Status: DC | PRN
  Administered 2021-07-28 – 2021-08-21 (×4): 650 mg via ORAL
  Filled 2021-07-24 (×4): qty 2

## 2021-07-24 MED ORDER — INSULIN GLARGINE-YFGN 100 UNIT/ML ~~LOC~~ SOLN
25.0000 [IU] | Freq: Every day | SUBCUTANEOUS | Status: DC
  Administered 2021-07-24 – 2021-08-02 (×10): 25 [IU] via SUBCUTANEOUS
  Filled 2021-07-24 (×14): qty 0.25

## 2021-07-24 MED ORDER — SODIUM CHLORIDE 0.9% FLUSH
3.0000 mL | Freq: Two times a day (BID) | INTRAVENOUS | Status: DC
  Administered 2021-07-25 – 2021-08-24 (×58): 3 mL via INTRAVENOUS

## 2021-07-24 MED ORDER — HEPARIN (PORCINE) 25000 UT/250ML-% IV SOLN
1850.0000 [IU]/h | INTRAVENOUS | Status: AC
  Administered 2021-07-24: 1650 [IU]/h via INTRAVENOUS
  Administered 2021-07-25 – 2021-07-28 (×6): 1850 [IU]/h via INTRAVENOUS
  Filled 2021-07-24 (×10): qty 250

## 2021-07-24 MED ORDER — OXYBUTYNIN CHLORIDE ER 5 MG PO TB24
10.0000 mg | ORAL_TABLET | Freq: Every day | ORAL | Status: DC
  Administered 2021-07-25 – 2021-08-25 (×32): 10 mg via ORAL
  Filled 2021-07-24 (×28): qty 2
  Filled 2021-07-24: qty 1
  Filled 2021-07-24 (×4): qty 2

## 2021-07-24 MED ORDER — INSULIN ASPART 100 UNIT/ML IJ SOLN
0.0000 [IU] | Freq: Every day | INTRAMUSCULAR | Status: DC
  Administered 2021-07-29: 3 [IU] via SUBCUTANEOUS
  Administered 2021-08-08 – 2021-08-21 (×4): 2 [IU] via SUBCUTANEOUS
  Filled 2021-07-24: qty 0.05

## 2021-07-24 MED ORDER — HEPARIN BOLUS VIA INFUSION
3000.0000 [IU] | Freq: Once | INTRAVENOUS | Status: AC
  Administered 2021-07-24: 3000 [IU] via INTRAVENOUS
  Filled 2021-07-24: qty 3000

## 2021-07-24 MED ORDER — INSULIN ASPART 100 UNIT/ML IJ SOLN
0.0000 [IU] | Freq: Three times a day (TID) | INTRAMUSCULAR | Status: DC
  Administered 2021-07-25 – 2021-07-28 (×4): 2 [IU] via SUBCUTANEOUS
  Administered 2021-07-28: 09:00:00 3 [IU] via SUBCUTANEOUS
  Administered 2021-07-28: 17:00:00 2 [IU] via SUBCUTANEOUS
  Administered 2021-07-29: 3 [IU] via SUBCUTANEOUS
  Administered 2021-07-30: 8 [IU] via SUBCUTANEOUS
  Administered 2021-07-30: 3 [IU] via SUBCUTANEOUS
  Administered 2021-07-31: 2 [IU] via SUBCUTANEOUS
  Administered 2021-07-31: 3 [IU] via SUBCUTANEOUS
  Administered 2021-08-01: 2 [IU] via SUBCUTANEOUS
  Administered 2021-08-01: 3 [IU] via SUBCUTANEOUS
  Administered 2021-08-02: 2 [IU] via SUBCUTANEOUS
  Administered 2021-08-02: 3 [IU] via SUBCUTANEOUS
  Administered 2021-08-04: 5 [IU] via SUBCUTANEOUS
  Administered 2021-08-04 – 2021-08-05 (×3): 2 [IU] via SUBCUTANEOUS
  Administered 2021-08-05: 5 [IU] via SUBCUTANEOUS
  Administered 2021-08-05: 2 [IU] via SUBCUTANEOUS
  Administered 2021-08-06: 5 [IU] via SUBCUTANEOUS
  Administered 2021-08-07 – 2021-08-09 (×6): 3 [IU] via SUBCUTANEOUS
  Administered 2021-08-10: 2 [IU] via SUBCUTANEOUS
  Administered 2021-08-10: 3 [IU] via SUBCUTANEOUS
  Administered 2021-08-11: 12:00:00 2 [IU] via SUBCUTANEOUS
  Administered 2021-08-11: 18:00:00 5 [IU] via SUBCUTANEOUS
  Administered 2021-08-12: 10:00:00 2 [IU] via SUBCUTANEOUS
  Administered 2021-08-12 (×2): 3 [IU] via SUBCUTANEOUS
  Administered 2021-08-13: 18:00:00 2 [IU] via SUBCUTANEOUS
  Administered 2021-08-13: 14:00:00 5 [IU] via SUBCUTANEOUS
  Administered 2021-08-14: 09:00:00 2 [IU] via SUBCUTANEOUS
  Administered 2021-08-14 (×2): 3 [IU] via SUBCUTANEOUS
  Administered 2021-08-15: 09:00:00 2 [IU] via SUBCUTANEOUS
  Administered 2021-08-15 (×2): 3 [IU] via SUBCUTANEOUS
  Administered 2021-08-16: 08:00:00 2 [IU] via SUBCUTANEOUS
  Administered 2021-08-16: 17:00:00 5 [IU] via SUBCUTANEOUS
  Administered 2021-08-16: 12:00:00 3 [IU] via SUBCUTANEOUS
  Administered 2021-08-17: 10:00:00 6 [IU] via SUBCUTANEOUS
  Administered 2021-08-17: 12:00:00 5 [IU] via SUBCUTANEOUS
  Administered 2021-08-17: 17:00:00 2 [IU] via SUBCUTANEOUS
  Administered 2021-08-18: 13:00:00 5 [IU] via SUBCUTANEOUS
  Administered 2021-08-18 – 2021-08-19 (×2): 3 [IU] via SUBCUTANEOUS
  Administered 2021-08-19: 09:00:00 2 [IU] via SUBCUTANEOUS
  Administered 2021-08-19 – 2021-08-20 (×3): 3 [IU] via SUBCUTANEOUS
  Administered 2021-08-20: 17:00:00 5 [IU] via SUBCUTANEOUS
  Administered 2021-08-21: 14:00:00 2 [IU] via SUBCUTANEOUS
  Administered 2021-08-21: 17:00:00 5 [IU] via SUBCUTANEOUS
  Administered 2021-08-21: 09:00:00 2 [IU] via SUBCUTANEOUS
  Administered 2021-08-22: 14:00:00 3 [IU] via SUBCUTANEOUS
  Administered 2021-08-22: 17:00:00 5 [IU] via SUBCUTANEOUS
  Administered 2021-08-22: 09:00:00 2 [IU] via SUBCUTANEOUS
  Administered 2021-08-23: 18:00:00 5 [IU] via SUBCUTANEOUS
  Administered 2021-08-23 (×2): 2 [IU] via SUBCUTANEOUS
  Administered 2021-08-24 – 2021-08-25 (×4): 3 [IU] via SUBCUTANEOUS
  Filled 2021-07-24: qty 0.15

## 2021-07-24 MED ORDER — SIMVASTATIN 20 MG PO TABS
20.0000 mg | ORAL_TABLET | Freq: Every day | ORAL | Status: DC
  Administered 2021-07-25 – 2021-08-24 (×31): 20 mg via ORAL
  Filled 2021-07-24: qty 2
  Filled 2021-07-24 (×2): qty 1
  Filled 2021-07-24 (×2): qty 2
  Filled 2021-07-24 (×5): qty 1
  Filled 2021-07-24 (×2): qty 2
  Filled 2021-07-24: qty 1
  Filled 2021-07-24 (×2): qty 2
  Filled 2021-07-24 (×4): qty 1
  Filled 2021-07-24: qty 2
  Filled 2021-07-24 (×5): qty 1
  Filled 2021-07-24 (×2): qty 2
  Filled 2021-07-24 (×2): qty 1
  Filled 2021-07-24 (×3): qty 2

## 2021-07-24 MED ORDER — BUPROPION HCL ER (XL) 150 MG PO TB24
150.0000 mg | ORAL_TABLET | Freq: Every day | ORAL | Status: DC
  Administered 2021-07-25 – 2021-08-25 (×32): 150 mg via ORAL
  Filled 2021-07-24 (×32): qty 1

## 2021-07-24 MED ORDER — INSULIN ASPART 100 UNIT/ML IJ SOLN
3.0000 [IU] | Freq: Three times a day (TID) | INTRAMUSCULAR | Status: DC
  Administered 2021-07-25 – 2021-08-20 (×58): 3 [IU] via SUBCUTANEOUS
  Filled 2021-07-24: qty 0.03

## 2021-07-24 NOTE — Progress Notes (Signed)
ANTICOAGULATION CONSULT NOTE - Initial Consult  Pharmacy Consult for Heparin Indication: pulmonary embolus  Allergies  Allergen Reactions   Lipitor [Atorvastatin Calcium] Other (See Comments)    Muscle soreness   Oxycodone Anxiety    Causes patient to become shaky and dizzy. Unable to tolerate.    Patient Measurements:   Heparin Dosing Weight:   Vital Signs: Temp: 97.7 F (36.5 C) (11/30 1700) Temp Source: Oral (11/30 1700) BP: 111/64 (11/30 2030) Pulse Rate: 103 (11/30 2030)  Labs: Recent Labs    07/24/21 1800  HGB 13.2  HCT 39.7  PLT 118*  CREATININE 0.89    Estimated Creatinine Clearance: 89.8 mL/min (by C-G formula based on SCr of 0.89 mg/dL).   Medical History: Past Medical History:  Diagnosis Date   AI (aortic insufficiency) 01/11/2018   Trace, noted on ECHO   Arthritis    CAD in native artery    Nuclear stress test 10/19: EF 58, normal perfusion, low risk   Cellulitis    Dermatitis    Diabetes mellitus    type II   Diastolic dysfunction 68/06/5725   Mild, noted on ECHO   Diverticulosis 01/31/2016   ASCENDING COLON AND CECUM, NOTED ON COLONOSCOPY   DOE (dyspnea on exertion)    Edema    Fatty liver 09/18/2004   Noted on [place] ABD   History of kidney stones 05/23/2002   Noted on CT Abd   Hx of colonic polyps 01/31/2016   Hyperlipidemia    Hypertension    Internal hemorrhoids 01/31/2016   Noted on colonoscopy   Obesity    OSA (obstructive sleep apnea)    cpap   Positional vertigo    Pulmonary hypertension ([place]) 01/11/2018   Mild, noted on ECHO   Rhinosinusitis    Skin lesion    TR (tricuspid regurgitation) 01/11/2018   Trace, noted on ECHO    Medications:  Infusions:   Assessment: 12 yoM with PMH of recent Covid pneumonia (admitted 11/6-11/8), OSA, CAD, DM, HTN presents to ED with SOB.  Pharmacy is consulted to dose heparin for PE.   No prior anticoagulation Baseline coags pending. CBC:  Hgb 13.2, Plt 118 (chronic  thrombocytopenia)  Goal of Therapy:  Heparin level 0.3-0.7 units/ml Monitor platelets by anticoagulation protocol: Yes   Plan:  Baseline PTT, PT/INR Give heparin 3000 units bolus IV x 1 Start heparin IV infusion at 1650 units/hr Heparin level 8 hours after starting Daily heparin level and CBC   Gretta Arab PharmD, BCPS Clinical Pharmacist [facility] main pharmacy [phone] 07/24/2021 8:43 PM

## 2021-07-24 NOTE — Telephone Encounter (Signed)
Spoke with the pt  He is asking about status of order for CPAP mask  Order sent to [facility] on 06/27/21 and states in notes that this was confirmed  I gave number to the pt to call [facility] to check on status of this order [phone] Nothing further needed

## 2021-07-24 NOTE — Telephone Encounter (Signed)
Jacob Bishop PT with [facility] is calling to report the pt oxygen level today is 88. Pt does have mucus cough and is sch for virtual appt tomorrow 07-25-2021 at 330 pm

## 2021-07-24 NOTE — H&P (Signed)
History and Physical    Jacob Bishop DOB: 1940-11-30 DOA: 07/24/2021  PCP: [name], NP   Patient coming from: Home   Chief Complaint: SOB   HPI: Jacob Bishop is a pleasant 80 y.o. male with medical history significant for coronary artery disease, insulin-dependent diabetes mellitus, OSA on CPAP, on 2 L/min of supplemental oxygen since recent COVID infection, lung nodules and liver lesion being worked up as an outpatient, now presenting with increased shortness of breath and increased supplemental oxygen requirement.  Patient was discharged from the hospital with 2 L/min of supplemental oxygen on 07/02/2021.  He reports that he never returned to his usual state but had been fairly stable until developing worsening shortness of breath again over the past few days.  His cough never resolved from a month ago but he denies any worsening in that, and denies any chest pain.  He reports chronic leg swelling that has not changed recently.  Denies any bleeding.  ED Course: Upon arrival to the ED, patient is found to be afebrile, saturating low 90s on 5 L/min of supplemental oxygen, tachypneic, mildly tachycardic, and with stable blood pressure.  Chemistry panel features alkaline phosphatase 206 and CBC notable for platelets 118,000.  CTA chest concerning for small segmental pulmonary emboli and findings concerning for worsening metastatic disease.  Patient was started on IV heparin in the ED.  Review of Systems:  All other systems reviewed and apart from HPI, are negative.  Past Medical History:  Diagnosis Date   AI (aortic insufficiency) 01/11/2018   Trace, noted on ECHO   Arthritis    CAD in native artery    Nuclear stress test 10/19: EF 58, normal perfusion, low risk   Cellulitis    Dermatitis    Diabetes mellitus    type II   Diastolic dysfunction 12/87/8676   Mild, noted on ECHO   Diverticulosis 01/31/2016   ASCENDING COLON AND CECUM, NOTED ON COLONOSCOPY   DOE  (dyspnea on exertion)    Edema    Fatty liver 09/18/2004   Noted on [place] ABD   History of kidney stones 05/23/2002   Noted on CT Abd   Hx of colonic polyps 01/31/2016   Hyperlipidemia    Hypertension    Internal hemorrhoids 01/31/2016   Noted on colonoscopy   Obesity    OSA (obstructive sleep apnea)    cpap   Positional vertigo    Pulmonary hypertension ([place]) 01/11/2018   Mild, noted on ECHO   Rhinosinusitis    Skin lesion    TR (tricuspid regurgitation) 01/11/2018   Trace, noted on ECHO    Past Surgical History:  Procedure Laterality Date   CARDIAC CATHETERIZATION     COLONOSCOPY     CORONARY STENT PLACEMENT     3 stents /1997   POLYPECTOMY     TONSILLECTOMY     TOTAL KNEE ARTHROPLASTY Right 10/04/2018   Procedure: RIGHT TOTAL KNEE ARTHROPLASTY;  Surgeon: Gaynelle Arabian, MD;  Location: [facility];  Service: Orthopedics;  Laterality: Right;  Adductor Block    Social History:   reports that he quit smoking about 25 years ago. His smoking use included cigarettes. He has a 20.00 pack-year smoking history. He has never used smokeless tobacco. He reports that he does not drink alcohol and does not use drugs.  Allergies  Allergen Reactions   Lipitor [Atorvastatin Calcium] Other (See Comments)    Muscle soreness   Oxycodone Anxiety    Causes patient to become  shaky and dizzy. Unable to tolerate.    Family History  Problem Relation Age of Onset   Cancer Mother        ? lung cancer   Heart disease Father    Heart attack Father    Heart disease Sister    Diabetes Sister    Heart attack Sister    Heart attack Brother      Prior to Admission medications   Medication Sig Start Date End Date Taking? Authorizing Provider  albuterol (VENTOLIN HFA) 108 (90 Base) MCG/ACT inhaler TAKE 2 PUFFS BY MOUTH EVERY 6 HOURS AS NEEDED FOR WHEEZE OR SHORTNESS OF BREATH Patient taking differently: 2 puffs every 6 (six) hours as needed for shortness of breath. 07/11/21  Yes Nafziger, Tommi Rumps, NP   buPROPion (WELLBUTRIN XL) 150 MG 24 hr tablet Take 1 tablet (150 mg total) by mouth daily. 06/14/21  Yes Nafziger, Tommi Rumps, NP  Insulin Glargine (BASAGLAR KWIKPEN) 100 UNIT/ML INJECT 0.4 MLS (40 UNITS TOTAL) INTO THE SKIN AT BEDTIME. Patient taking differently: Inject 40 Units into the skin at bedtime. INJECT 0.4 MLS (40 UNITS TOTAL) INTO THE SKIN AT BEDTIME. 09/26/20  Yes Nafziger, Tommi Rumps, NP  meclizine (ANTIVERT) 12.5 MG tablet TAKE ONE TABLET BY MOUTH three times daily AS NEEDED FOR dizziness Patient taking differently: Take 12.5 mg by mouth 3 (three) times daily as needed. 06/18/21  Yes Nafziger, Tommi Rumps, NP  oxybutynin (DITROPAN-XL) 10 MG 24 hr tablet Take 10 mg by mouth daily. 02/29/20  Yes Ceasar Mons, MD  simvastatin (ZOCOR) 20 MG tablet Take 1 tablet (20 mg total) by mouth daily at 6 PM. Patient taking differently: Take 20 mg by mouth daily. 11/30/20  Yes Nafziger, Tommi Rumps, NP  tamsulosin (FLOMAX) 0.4 MG CAPS capsule Take 0.4 mg by mouth 2 (two) times daily. 03/07/15  Yes Ceasar Mons, MD  Insulin Pen Needle (BD PEN NEEDLE NANO U/F) 32G X 4 MM MISC USE AS DIRECTED TWICE A DAY 05/18/20   [name], NP  Misc. Devices (CAREX COCCYX CUSHION) MISC Use when sitting in chair or couch 07/16/21   [name], NP    Physical Exam: Vitals:   07/24/21 2100 07/24/21 2130 07/24/21 2200 07/24/21 2230  BP: 128/64 (!) 118/59 138/65 (!) 136/58  Pulse: (!) 103 (!) 102 85 100  Resp: (!) 26 (!) 30 (!) 31 (!) 28  Temp:      TempSrc:      SpO2: 91% 91% 90% 91%    Constitutional: NAD, calm  Eyes: PERTLA, lids and conjunctivae normal ENMT: Mucous membranes are moist. Posterior pharynx clear of any exudate or lesions.   Neck: supple, no masses  Respiratory: no wheezing, no crackles. No accessory muscle use.  Cardiovascular: S1 & S2 heard, regular rate and rhythm. Right leg swelling. Abdomen: No distension, no tenderness, soft. Bowel sounds active.  Musculoskeletal: no clubbing /  cyanosis. No joint deformity upper and lower extremities.   Skin: no significant rashes, lesions, ulcers. Warm, dry, well-perfused. Neurologic: CN 2-12 grossly intact. Moving all extremities. Alert and oriented.  Psychiatric: Pleasant. Cooperative.    Labs and Imaging on Admission: I have personally reviewed following labs and imaging studies  CBC: Recent Labs  Lab 07/24/21 1800  WBC 7.0  NEUTROABS 5.5  HGB 13.2  HCT 39.7  MCV 94.3  PLT 478*   Basic Metabolic Panel: Recent Labs  Lab 07/24/21 1800  NA 135  K 3.6  CL 93*  CO2 32  GLUCOSE 143*  BUN 12  CREATININE 0.89  CALCIUM 8.4*   GFR: Estimated Creatinine Clearance: 89.8 mL/min (by C-G formula based on SCr of 0.89 mg/dL). Liver Function Tests: Recent Labs  Lab 07/24/21 1800  AST 24  ALT 30  ALKPHOS 206*  BILITOT 1.5*  PROT 7.3  ALBUMIN 3.1*   No results for input(s): LIPASE, AMYLASE in the last 168 hours. No results for input(s): AMMONIA in the last 168 hours. Coagulation Profile: Recent Labs  Lab 07/24/21 2050  INR 1.2   Cardiac Enzymes: No results for input(s): CKTOTAL, CKMB, CKMBINDEX, TROPONINI in the last 168 hours. BNP (last 3 results) No results for input(s): PROBNP in the last 8760 hours. HbA1C: No results for input(s): HGBA1C in the last 72 hours. CBG: No results for input(s): GLUCAP in the last 168 hours. Lipid Profile: No results for input(s): CHOL, HDL, LDLCALC, TRIG, CHOLHDL, LDLDIRECT in the last 72 hours. Thyroid Function Tests: No results for input(s): TSH, T4TOTAL, FREET4, T3FREE, THYROIDAB in the last 72 hours. Anemia Panel: No results for input(s): VITAMINB12, FOLATE, FERRITIN, TIBC, IRON, RETICCTPCT in the last 72 hours. Urine analysis:    Component Value Date/Time   COLORURINE YELLOW 06/29/2021 2315   APPEARANCEUR CLEAR 06/29/2021 2315   LABSPEC 1.019 06/29/2021 2315   PHURINE 5.0 06/29/2021 2315   GLUCOSEU NEGATIVE 06/29/2021 2315   HGBUR MODERATE (A) 06/29/2021 2315    BILIRUBINUR NEGATIVE 06/29/2021 2315   BILIRUBINUR Negative 03/25/2018 0902   KETONESUR 5 (A) 06/29/2021 2315   PROTEINUR NEGATIVE 06/29/2021 2315   UROBILINOGEN 0.2 03/25/2018 0902   NITRITE NEGATIVE 06/29/2021 2315   LEUKOCYTESUR NEGATIVE 06/29/2021 2315   Sepsis Labs: @LABRCNTIP (procalcitonin:4,lacticidven:4) ) Recent Results (from the past 240 hour(s))  Resp Panel by RT-PCR (Flu A&B, Covid) Nasopharyngeal Swab     Status: None   Collection Time: 07/24/21  6:00 PM   Specimen: Nasopharyngeal Swab; Nasopharyngeal(NP) swabs in vial transport medium  Result Value Ref Range Status   SARS Coronavirus 2 by RT PCR NEGATIVE NEGATIVE Final    Comment: (NOTE) SARS-CoV-2 target nucleic acids are NOT DETECTED.  The SARS-CoV-2 RNA is generally detectable in upper respiratory specimens during the acute phase of infection. The lowest concentration of SARS-CoV-2 viral copies this assay can detect is 138 copies/mL. A negative result does not preclude SARS-Cov-2 infection and should not be used as the sole basis for treatment or other patient management decisions. A negative result may occur with  improper specimen collection/handling, submission of specimen other than nasopharyngeal swab, presence of viral mutation(s) within the areas targeted by this assay, and inadequate number of viral copies(<138 copies/mL). A negative result must be combined with clinical observations, patient history, and epidemiological information. The expected result is Negative.  Fact Sheet for Patients:  EntrepreneurPulse.com.au  Fact Sheet for Healthcare Providers:  IncredibleEmployment.be  This test is no t yet approved or cleared by the [place] FDA and  has been authorized for detection and/or diagnosis of SARS-CoV-2 by FDA under an Emergency Use Authorization (EUA). This EUA will remain  in effect (meaning this test can be used) for the duration of the COVID-19  declaration under Section 564(b)(1) of the Act, 21 U.S.C.section 360bbb-3(b)(1), unless the authorization is terminated  or revoked sooner.       Influenza A by PCR NEGATIVE NEGATIVE Final   Influenza B by PCR NEGATIVE NEGATIVE Final    Comment: (NOTE) The Xpert Xpress SARS-CoV-2/FLU/RSV plus assay is intended as an aid in the diagnosis of influenza from Nasopharyngeal swab specimens and should not  be used as a sole basis for treatment. Nasal washings and aspirates are unacceptable for Xpert Xpress SARS-CoV-2/FLU/RSV testing.  Fact Sheet for Patients: EntrepreneurPulse.com.au  Fact Sheet for Healthcare Providers: IncredibleEmployment.be  This test is not yet approved or cleared by the [place] FDA and has been authorized for detection and/or diagnosis of SARS-CoV-2 by FDA under an Emergency Use Authorization (EUA). This EUA will remain in effect (meaning this test can be used) for the duration of the COVID-19 declaration under Section 564(b)(1) of the Act, 21 U.S.C. section 360bbb-3(b)(1), unless the authorization is terminated or revoked.  Performed at [facility], [place] [address], [place], [place] [address]      Radiological Exams on Admission: CT Angio Chest PE W and/or Wo Contrast  Result Date: 07/24/2021 CLINICAL DATA:  Shortness of breath and hypoxia. EXAM: CT ANGIOGRAPHY CHEST WITH CONTRAST TECHNIQUE: Multidetector CT imaging of the chest was performed using the standard protocol during bolus administration of intravenous contrast. Multiplanar CT image reconstructions and MIPs were obtained to evaluate the vascular anatomy. CONTRAST:  134mL OMNIPAQUE IOHEXOL 350 MG/ML SOLN COMPARISON:  Portable chest today, portable chest 06/29/2021, CTA chest 06/24/2021. FINDINGS: Cardiovascular: Small pericardial effusion is unchanged. There is mild cardiomegaly with moderate to heavy three-vessel calcific CAD, mild patchy  calcification in the aorta without dissection or great vessel stenosis. Stable ectatic ascending aorta measuring 3.8 cm the remainder normal in caliber. Upper limit of normal pulmonary trunk caliber is seen with small hypodense embolus in an anterior right upper lobe segmental artery on series 5 axial 39. Other visualized pulmonary arteries are clear but the segmental and subsegmental arteries in the lower lobes are obscured due to breathing motion and atelectasis/consolidation. There is no venous dilatation , no acute right heart strain findings. Mediastinum/Nodes: There are scattered stable slightly prominent hilar lymph nodes up to 1.1 cm in short axis, right paratracheal adenopathy with index lymph node 1.4 cm in short axis on series 5 axial 21, previously 1.2 cm. A second index right paratracheal lymph node on axial 32 is 1.3 cm in short axis, previously 1.0 cm with similar mildly enlarged left-sided prevascular, precarinal and subcarinal lymph nodes also slightly enlarged in the interval. The trachea and central airways are clear. The esophagus and visualized thyroid are unremarkable. No axillary adenopathy. Lungs/Pleura: Small layering pleural effusions are slightly increased in the interval since October 31. There is increased patchy atelectasis or consolidation in the adjacent basal segments of both lower lobes. Numerous bilateral pulmonary nodules are again noted with several scattered small new nodules in the bilateral upper and right middle lobes and enlargement of pre-existing nodules with index anterior right apical nodule on series 11 axial 27 now measuring 2.2 cm, previously 1.9 cm, index right upper lobe base nodule laterally now measuring 2.3 cm, previously 0.8 cm. Other multiple bilateral nodules are also similarly enlarged in the interval, some more than doubled in size. Largest right lower lobe nodule is now 3.8 cm, previously 2.8 cm. Largest left lower lobe nodule is now 3.1 cm, previously 2.1  cm. Again this is highly worrisome for malignancy with numerous metastases. Upper Abdomen: There is a faint hypodensity again noted in the lateral segment left lobe liver measuring 2.5 cm on series 5 axial 117, previously 2.3 cm but not well seen on the current or prior studies. There is a hypodense lesion also in the caudate hepatic lobe again measuring 4.6 cm. The spleen is enlarged, measuring 17.7 cm coronal. Musculoskeletal: There is osteopenia with  degenerative changes of the spine and multilevel extensive spinal bridging enthesopathy, multilevel degenerative discs. No destructive thoracic bone lesion is seen. Review of the MIP images confirms the above findings. IMPRESSION: 1. Small segmental arterial embolus is seen in the right upper lobe but there are no other visible emboli, no evidence of acute right heart strain. Please note however, the subsegmental and segmental arteries in the lower lobes are obscured by breathing motion and consolidation/atelectasis. 2. Multiple mildly enlarged mediastinal and hilar lymph nodes, stable to slightly worsened in the interval. 3. Numerous bilateral lung masses, with several small new nodules, without cavitation and with interval enlargement of pre-existing nodules. Findings are highly worrisome for metastatic disease with worsening. 4. Faint 2.5 cm heterogeneous lesion in the left lobe of the liver, equivocally slightly larger than previously, measuring 2.5 cm with stable 4.6 cm lesion in the caudate lobe. Splenomegaly. 5. Osteopenia, degenerative and DISH changes. 6. Mild cardiomegaly with aortic and coronary artery atherosclerosis. Small stable pericardial effusion. 7. Small pleural effusions slightly increased since 06/24/2021 with increased consolidation or atelectasis in the adjacent lower lobe basal segments. Electronically Signed   By: Telford Nab M.D.   On: 07/24/2021 20:20   DG Chest Port 1 View  Result Date: 07/24/2021 CLINICAL DATA:  Short of breath  EXAM: PORTABLE CHEST 1 VIEW COMPARISON:  06/29/2021 FINDINGS: Hypoventilation with decreased lung volume and bibasilar atelectasis. No pleural effusion. IMPRESSION: Hypoventilation with bibasilar atelectasis. Electronically Signed   By: Franchot Gallo M.D.   On: 07/24/2021 19:01     Assessment/Plan   1. Pulmonary embolism; acute hypoxic respiratory failure   - Pt on 2 Lpm since recent COVID p/w increased SOB and increased oxygen requirement and is found to have PE  - IV heparin started in ED  - He was mildly tachycardic with stable BP  - He has Rt>Lt leg swelling but no evidence for phelgmasia  - Continue IV heparin, check BNP and troponin, check echocardiogram and LE venous US    2. Lung nodules; liver mass  - This is being worked up and he is scheduled for PET    3. Insulin-dependent DM  - A1c was 6.2% in November 2022  - Continue CBG checks and insulin   4. Thrombocytopenia - Platelets 118k on admission, up from 90,000 earlier this month  - Monitor closely while starting IV heparin    DVT prophylaxis: IV heparin  Code Status: Full, confirmed with patient in ED  Level of Care: Level of care: Progressive Family Communication: None present  Disposition Plan:  Patient is from: Home  Anticipated d/c is to: TBD Anticipated d/c date is: 12/2 or 07/27/21  Patient currently: Pending echo, LE venous US, transition to oral anticoagulant  Consults called: none  Admission status: Inpatient     Vianne Bulls, MD [facility]  07/24/2021, 11:06 PM

## 2021-07-24 NOTE — ED Triage Notes (Signed)
From home, Dx with COVID and PNA two weeks ago, given ABX and O2 and DC, has been SOB with exertion since, not resolving. Hx of resp issues and O2 use.  BP 130/90 HR 85 RR 20 Sp02 95 4ltrs nasal cannula CBG 150

## 2021-07-24 NOTE — ED Provider Notes (Signed)
[facility] DEPT Provider Note   CSN: [id number] Arrival date & time: 07/24/21  1646     History Chief Complaint  Patient presents with   Shortness of [place] is a 80 y.o. male hx of CAD, DM, hypertension, recent COVID infection with COVID-pneumonia here presenting with shortness of breath.  Patient was diagnosed with COVID and was hospitalized and discharged home about 3 weeks ago.  He states that he was discharged on 2 to 3 L nasal cannula.  He states that he has persistent shortness of breath.  He states that especially when he exerts himself he gets very short of breath.  He also has some productive cough with yellowish sputum.  He called his doctor and was sent here to rule out pneumonia versus PE.  The history is provided by the patient.      Past Medical History:  Diagnosis Date   AI (aortic insufficiency) 01/11/2018   Trace, noted on ECHO   Arthritis    CAD in native artery    Nuclear stress test 10/19: EF 58, normal perfusion, low risk   Cellulitis    Dermatitis    Diabetes mellitus    type II   Diastolic dysfunction 06/18/8526   Mild, noted on ECHO   Diverticulosis 01/31/2016   ASCENDING COLON AND CECUM, NOTED ON COLONOSCOPY   DOE (dyspnea on exertion)    Edema    Fatty liver 09/18/2004   Noted on [place] ABD   History of kidney stones 05/23/2002   Noted on CT Abd   Hx of colonic polyps 01/31/2016   Hyperlipidemia    Hypertension    Internal hemorrhoids 01/31/2016   Noted on colonoscopy   Obesity    OSA (obstructive sleep apnea)    cpap   Positional vertigo    Pulmonary hypertension ([place]) 01/11/2018   Mild, noted on ECHO   Rhinosinusitis    Skin lesion    TR (tricuspid regurgitation) 01/11/2018   Trace, noted on ECHO    Patient Active Problem List   Diagnosis Date Noted   Acute pulmonary embolism ([place]) 07/24/2021   Acute on chronic respiratory failure with hypoxia ([place]) 06/30/2021   DM type 2 with  diabetic mixed hyperlipidemia ([place]) 06/30/2021   Multiple lung nodules on CT 06/27/2021   Liver masses 06/27/2021   Chronic respiratory failure with hypoxia ([place]) 06/27/2021   OA (osteoarthritis) of knee 10/04/2018   Other secondary pulmonary hypertension ([place]) 04/06/2018   Osteoarthritis, hand 05/05/2014   Carpal tunnel syndrome 05/02/2014   Eczema 01/23/2014   Thrombocytopenia, unspecified ([place]) 11/19/2012   Overactive bladder 08/24/2012   Osteoarthritis of right knee 01/22/2012   Benign positional vertigo 07/09/2011   CAD, NATIVE VESSEL 10/22/2009   EDEMA 08/03/2009   Obstructive sleep apnea 05/03/2009   SKIN LESION 09/23/2007   Diabetes mellitus type 2, uncontrolled 03/23/2007   Hyperlipidemia 03/23/2007   Essential hypertension 03/23/2007   COLONIC POLYPS, HX OF 03/23/2007   Morbid obesity ([place]) 03/23/2007    Past Surgical History:  Procedure Laterality Date   CARDIAC CATHETERIZATION     COLONOSCOPY     CORONARY STENT PLACEMENT     3 stents /1997   POLYPECTOMY     TONSILLECTOMY     TOTAL KNEE ARTHROPLASTY Right 10/04/2018   Procedure: RIGHT TOTAL KNEE ARTHROPLASTY;  Surgeon: Gaynelle Arabian, MD;  Location: [facility];  Service: Orthopedics;  Laterality: Right;  Adductor Block       Family History  Problem Relation Age of Onset   Cancer Mother        ? lung cancer   Heart disease Father    Heart attack Father    Heart disease Sister    Diabetes Sister    Heart attack Sister    Heart attack Brother     Social History   Tobacco Use   Smoking status: Former    Packs/day: 1.00    Years: 20.00    Pack years: 20.00    Types: Cigarettes    Quit date: 01/10/1996    Years since quitting: 25.5   Smokeless tobacco: Never  Vaping Use   Vaping Use: Never used  Substance Use Topics   Alcohol use: No   Drug use: No    Home Medications Prior to Admission medications   Medication Sig Start Date End Date Taking? Authorizing Provider  albuterol (VENTOLIN HFA) 108 (90  Base) MCG/ACT inhaler TAKE 2 PUFFS BY MOUTH EVERY 6 HOURS AS NEEDED FOR WHEEZE OR SHORTNESS OF BREATH Patient taking differently: 2 puffs every 6 (six) hours as needed for shortness of breath. 07/11/21  Yes Nafziger, Tommi Rumps, NP  buPROPion (WELLBUTRIN XL) 150 MG 24 hr tablet Take 1 tablet (150 mg total) by mouth daily. 06/14/21  Yes Nafziger, Tommi Rumps, NP  Insulin Glargine (BASAGLAR KWIKPEN) 100 UNIT/ML INJECT 0.4 MLS (40 UNITS TOTAL) INTO THE SKIN AT BEDTIME. Patient taking differently: Inject 40 Units into the skin at bedtime. INJECT 0.4 MLS (40 UNITS TOTAL) INTO THE SKIN AT BEDTIME. 09/26/20  Yes Nafziger, Tommi Rumps, NP  meclizine (ANTIVERT) 12.5 MG tablet TAKE ONE TABLET BY MOUTH three times daily AS NEEDED FOR dizziness Patient taking differently: Take 12.5 mg by mouth 3 (three) times daily as needed. 06/18/21  Yes Nafziger, Tommi Rumps, NP  oxybutynin (DITROPAN-XL) 10 MG 24 hr tablet Take 10 mg by mouth daily. 02/29/20  Yes Ceasar Mons, MD  simvastatin (ZOCOR) 20 MG tablet Take 1 tablet (20 mg total) by mouth daily at 6 PM. Patient taking differently: Take 20 mg by mouth daily. 11/30/20  Yes Nafziger, Tommi Rumps, NP  tamsulosin (FLOMAX) 0.4 MG CAPS capsule Take 0.4 mg by mouth 2 (two) times daily. 03/07/15  Yes Ceasar Mons, MD  dexamethasone (DECADRON) 6 MG tablet Take 1 tablet (6 mg total) by mouth daily. Patient not taking: Reported on 07/24/2021 07/02/21   Georgette Shell, MD  Insulin Pen Needle (BD PEN NEEDLE NANO U/F) 32G X 4 MM MISC USE AS DIRECTED TWICE A DAY 05/18/20   [name], NP  Misc. Devices (CAREX COCCYX CUSHION) MISC Use when sitting in chair or couch 07/16/21   Nafziger, Tommi Rumps, NP    Allergies    Lipitor [atorvastatin calcium] and Oxycodone  Review of Systems   Review of Systems  Respiratory:  Positive for shortness of breath.   All other systems reviewed and are negative.  Physical Exam Updated Vital Signs BP 128/64   Pulse (!) 103   Temp 97.7 F (36.5 C)  (Oral)   Resp (!) 26   SpO2 91%   Physical Exam Vitals and nursing note reviewed.  Constitutional:      Comments: Tachypneic, moderate distress   HENT:     Head: Normocephalic.     Mouth/Throat:     Mouth: Mucous membranes are moist.  Eyes:     Extraocular Movements: Extraocular movements intact.     Pupils: Pupils are equal, round, and reactive to light.  Cardiovascular:     Rate and  Rhythm: Normal rate and regular rhythm.  Pulmonary:     Comments: Tachypneic, crackles bilaterally  Musculoskeletal:        General: Normal range of motion.     Cervical back: Normal range of motion and neck supple.  Skin:    General: Skin is warm.     Capillary Refill: Capillary refill takes less than 2 seconds.  Neurological:     General: No focal deficit present.     Mental Status: He is alert and oriented to person, place, and time.  Psychiatric:        Mood and Affect: Mood normal.        Behavior: Behavior normal.    ED Results / Procedures / Treatments   Labs (all labs ordered are listed, but only abnormal results are displayed) Labs Reviewed  CBC WITH DIFFERENTIAL/PLATELET - Abnormal; Notable for the following components:      Result Value   RBC 4.21 (*)    Platelets 118 (*)    All other components within normal limits  COMPREHENSIVE METABOLIC PANEL - Abnormal; Notable for the following components:   Chloride 93 (*)    Glucose, Bld 143 (*)    Calcium 8.4 (*)    Albumin 3.1 (*)    Alkaline Phosphatase 206 (*)    Total Bilirubin 1.5 (*)    All other components within normal limits  BLOOD GAS, VENOUS - Abnormal; Notable for the following components:   pCO2, Ven 60.6 (*)    pO2, Ven <31.0 (*)    Bicarbonate 33.5 (*)    Acid-Base Excess 6.7 (*)    All other components within normal limits  PROTIME-INR - Abnormal; Notable for the following components:   Prothrombin Time 15.4 (*)    All other components within normal limits  RESP PANEL BY RT-PCR (FLU A&B, COVID) ARPGX2   CULTURE, BLOOD (ROUTINE X 2)  CULTURE, BLOOD (ROUTINE X 2)  LACTIC ACID, PLASMA  APTT  HEPARIN LEVEL (UNFRACTIONATED)    EKG None  Radiology CT Angio Chest PE W and/or Wo Contrast  Result Date: 07/24/2021 CLINICAL DATA:  Shortness of breath and hypoxia. EXAM: CT ANGIOGRAPHY CHEST WITH CONTRAST TECHNIQUE: Multidetector CT imaging of the chest was performed using the standard protocol during bolus administration of intravenous contrast. Multiplanar CT image reconstructions and MIPs were obtained to evaluate the vascular anatomy. CONTRAST:  112mL OMNIPAQUE IOHEXOL 350 MG/ML SOLN COMPARISON:  Portable chest today, portable chest 06/29/2021, CTA chest 06/24/2021. FINDINGS: Cardiovascular: Small pericardial effusion is unchanged. There is mild cardiomegaly with moderate to heavy three-vessel calcific CAD, mild patchy calcification in the aorta without dissection or great vessel stenosis. Stable ectatic ascending aorta measuring 3.8 cm the remainder normal in caliber. Upper limit of normal pulmonary trunk caliber is seen with small hypodense embolus in an anterior right upper lobe segmental artery on series 5 axial 39. Other visualized pulmonary arteries are clear but the segmental and subsegmental arteries in the lower lobes are obscured due to breathing motion and atelectasis/consolidation. There is no venous dilatation , no acute right heart strain findings. Mediastinum/Nodes: There are scattered stable slightly prominent hilar lymph nodes up to 1.1 cm in short axis, right paratracheal adenopathy with index lymph node 1.4 cm in short axis on series 5 axial 21, previously 1.2 cm. A second index right paratracheal lymph node on axial 32 is 1.3 cm in short axis, previously 1.0 cm with similar mildly enlarged left-sided prevascular, precarinal and subcarinal lymph nodes also slightly enlarged in the interval.  The trachea and central airways are clear. The esophagus and visualized thyroid are unremarkable. No  axillary adenopathy. Lungs/Pleura: Small layering pleural effusions are slightly increased in the interval since October 31. There is increased patchy atelectasis or consolidation in the adjacent basal segments of both lower lobes. Numerous bilateral pulmonary nodules are again noted with several scattered small new nodules in the bilateral upper and right middle lobes and enlargement of pre-existing nodules with index anterior right apical nodule on series 11 axial 27 now measuring 2.2 cm, previously 1.9 cm, index right upper lobe base nodule laterally now measuring 2.3 cm, previously 0.8 cm. Other multiple bilateral nodules are also similarly enlarged in the interval, some more than doubled in size. Largest right lower lobe nodule is now 3.8 cm, previously 2.8 cm. Largest left lower lobe nodule is now 3.1 cm, previously 2.1 cm. Again this is highly worrisome for malignancy with numerous metastases. Upper Abdomen: There is a faint hypodensity again noted in the lateral segment left lobe liver measuring 2.5 cm on series 5 axial 117, previously 2.3 cm but not well seen on the current or prior studies. There is a hypodense lesion also in the caudate hepatic lobe again measuring 4.6 cm. The spleen is enlarged, measuring 17.7 cm coronal. Musculoskeletal: There is osteopenia with degenerative changes of the spine and multilevel extensive spinal bridging enthesopathy, multilevel degenerative discs. No destructive thoracic bone lesion is seen. Review of the MIP images confirms the above findings. IMPRESSION: 1. Small segmental arterial embolus is seen in the right upper lobe but there are no other visible emboli, no evidence of acute right heart strain. Please note however, the subsegmental and segmental arteries in the lower lobes are obscured by breathing motion and consolidation/atelectasis. 2. Multiple mildly enlarged mediastinal and hilar lymph nodes, stable to slightly worsened in the interval. 3. Numerous bilateral  lung masses, with several small new nodules, without cavitation and with interval enlargement of pre-existing nodules. Findings are highly worrisome for metastatic disease with worsening. 4. Faint 2.5 cm heterogeneous lesion in the left lobe of the liver, equivocally slightly larger than previously, measuring 2.5 cm with stable 4.6 cm lesion in the caudate lobe. Splenomegaly. 5. Osteopenia, degenerative and DISH changes. 6. Mild cardiomegaly with aortic and coronary artery atherosclerosis. Small stable pericardial effusion. 7. Small pleural effusions slightly increased since 06/24/2021 with increased consolidation or atelectasis in the adjacent lower lobe basal segments. Electronically Signed   By: Telford Nab M.D.   On: 07/24/2021 20:20   DG Chest Port 1 View  Result Date: 07/24/2021 CLINICAL DATA:  Short of breath EXAM: PORTABLE CHEST 1 VIEW COMPARISON:  06/29/2021 FINDINGS: Hypoventilation with decreased lung volume and bibasilar atelectasis. No pleural effusion. IMPRESSION: Hypoventilation with bibasilar atelectasis. Electronically Signed   By: Franchot Gallo M.D.   On: 07/24/2021 19:01    Procedures Procedures   CRITICAL CARE Performed by: Wandra Arthurs   Total critical care time: 30  minutes  Critical care time was exclusive of separately billable procedures and treating other patients.  Critical care was necessary to treat or prevent imminent or life-threatening deterioration.  Critical care was time spent personally by me on the following activities: development of treatment plan with patient and/or surrogate as well as nursing, discussions with consultants, evaluation of patient's response to treatment, examination of patient, obtaining history from patient or surrogate, ordering and performing treatments and interventions, ordering and review of laboratory studies, ordering and review of radiographic studies, pulse oximetry and re-evaluation of patient's  condition.   Medications  Ordered in ED Medications  heparin ADULT infusion 100 units/mL (25000 units/230mL) (1,650 Units/hr Intravenous New Bag/Given 07/24/21 2104)  iohexol (OMNIPAQUE) 350 MG/ML injection 100 mL (100 mLs Intravenous Contrast Given 07/24/21 1929)  heparin bolus via infusion 3,000 Units (3,000 Units Intravenous Bolus from Bag 07/24/21 2104)    ED Course  I have reviewed the triage vital signs and the nursing notes.  Pertinent labs & imaging results that were available during my care of the patient were reviewed by me and considered in my medical decision making (see chart for details).    MDM Rules/Calculators/A&P                           Jacob HOFFERBER is a 79 y.o. male with recent COVID here presenting with worsening shortness of breath.  Patient was discharged home with 2 to 3 L nasal cannula.  Is hypoxic on arrival and required 5 L nasal cannula.  Consider pneumonia versus PE.  Will get CT PE and CBC and CMP  9 pm CT showed small segmental PE.  Patient also has worsening mets.  Patient does not have pneumonia on CT.  Given heparin and hospitalist to admit for PE and concern for possible malignancy   Final Clinical Impression(s) / ED Diagnoses Final diagnoses:  None    Rx / DC Orders ED Discharge Orders     None        Drenda Freeze, MD 07/24/21 2134

## 2021-07-24 NOTE — Telephone Encounter (Signed)
Spoke to [place] and she advised that I call the pt to inform him of message below. Pt stated that he will go to the ER today.

## 2021-07-25 ENCOUNTER — Ambulatory Visit (HOSPITAL_COMMUNITY): Admission: RE | Admit: 2021-07-25 | Payer: Medicare Other | Source: Ambulatory Visit

## 2021-07-25 ENCOUNTER — Inpatient Hospital Stay (HOSPITAL_COMMUNITY): Payer: Medicare Other

## 2021-07-25 ENCOUNTER — Telehealth: Payer: Medicare Other | Admitting: Adult Health

## 2021-07-25 DIAGNOSIS — I2602 Saddle embolus of pulmonary artery with acute cor pulmonale: Secondary | ICD-10-CM

## 2021-07-25 DIAGNOSIS — L8990 Pressure ulcer of unspecified site, unspecified stage: Secondary | ICD-10-CM | POA: Insufficient documentation

## 2021-07-25 DIAGNOSIS — I2699 Other pulmonary embolism without acute cor pulmonale: Secondary | ICD-10-CM

## 2021-07-25 DIAGNOSIS — F32A Depression, unspecified: Secondary | ICD-10-CM

## 2021-07-25 LAB — BRAIN NATRIURETIC PEPTIDE: B Natriuretic Peptide: 27.5 pg/mL (ref 0.0–100.0)

## 2021-07-25 LAB — CBC
HCT: 35.2 % — ABNORMAL LOW (ref 39.0–52.0)
Hemoglobin: 11.7 g/dL — ABNORMAL LOW (ref 13.0–17.0)
MCH: 31 pg (ref 26.0–34.0)
MCHC: 33.2 g/dL (ref 30.0–36.0)
MCV: 93.4 fL (ref 80.0–100.0)
Platelets: 107 10*3/uL — ABNORMAL LOW (ref 150–400)
RBC: 3.77 MIL/uL — ABNORMAL LOW (ref 4.22–5.81)
RDW: 13.5 % (ref 11.5–15.5)
WBC: 7 10*3/uL (ref 4.0–10.5)
nRBC: 0 % (ref 0.0–0.2)

## 2021-07-25 LAB — MAGNESIUM: Magnesium: 2.1 mg/dL (ref 1.7–2.4)

## 2021-07-25 LAB — HEPARIN LEVEL (UNFRACTIONATED)
Heparin Unfractionated: 0.27 IU/mL — ABNORMAL LOW (ref 0.30–0.70)
Heparin Unfractionated: 0.54 IU/mL (ref 0.30–0.70)

## 2021-07-25 LAB — BASIC METABOLIC PANEL
Anion gap: 5 (ref 5–15)
BUN: 12 mg/dL (ref 8–23)
CO2: 30 mmol/L (ref 22–32)
Calcium: 7.6 mg/dL — ABNORMAL LOW (ref 8.9–10.3)
Chloride: 99 mmol/L (ref 98–111)
Creatinine, Ser: 0.78 mg/dL (ref 0.61–1.24)
GFR, Estimated: >60 mL/min (ref >60)
Glucose, Bld: 119 mg/dL — ABNORMAL HIGH (ref 70–99)
Potassium: 3.6 mmol/L (ref 3.5–5.1)
Sodium: 134 mmol/L — ABNORMAL LOW (ref 135–145)

## 2021-07-25 LAB — GLUCOSE, CAPILLARY
Glucose-Capillary: 149 mg/dL — ABNORMAL HIGH (ref 70–99)
Glucose-Capillary: 107 mg/dL — ABNORMAL HIGH (ref 70–99)

## 2021-07-25 LAB — HEPATIC FUNCTION PANEL
ALT: 23 U/L (ref 0–44)
AST: 22 U/L (ref 15–41)
Albumin: 2.5 g/dL — ABNORMAL LOW (ref 3.5–5.0)
Alkaline Phosphatase: 184 U/L — ABNORMAL HIGH (ref 38–126)
Bilirubin, Direct: 0.2 mg/dL (ref 0.0–0.2)
Indirect Bilirubin: 0.9 mg/dL (ref 0.3–0.9)
Total Bilirubin: 1.1 mg/dL (ref 0.3–1.2)
Total Protein: 6.1 g/dL — ABNORMAL LOW (ref 6.5–8.1)

## 2021-07-25 LAB — ECHOCARDIOGRAM COMPLETE
Height: 71 in
Weight: 3880.1 oz

## 2021-07-25 LAB — MRSA NEXT GEN BY PCR, NASAL: MRSA by PCR Next Gen: NOT DETECTED

## 2021-07-25 LAB — CBG MONITORING, ED
Glucose-Capillary: 118 mg/dL — ABNORMAL HIGH (ref 70–99)
Glucose-Capillary: 103 mg/dL — ABNORMAL HIGH (ref 70–99)

## 2021-07-25 LAB — TROPONIN I (HIGH SENSITIVITY): Troponin I (High Sensitivity): 10 ng/L (ref <18)

## 2021-07-25 MED ORDER — CALCIUM CARBONATE ANTACID 500 MG PO CHEW
1.0000 | CHEWABLE_TABLET | Freq: Once | ORAL | Status: AC
Start: 2021-07-25 — End: 2021-07-25
  Administered 2021-07-25: 200 mg via ORAL
  Filled 2021-07-25: qty 1

## 2021-07-25 MED ORDER — PERFLUTREN LIPID MICROSPHERE
1.0000 mL | INTRAVENOUS | Status: AC | PRN
Start: 2021-07-25 — End: 2021-07-25
  Administered 2021-07-25: 2 mL via INTRAVENOUS
  Filled 2021-07-25: qty 10

## 2021-07-25 MED ORDER — ALUM & MAG HYDROXIDE-SIMETH 200-200-20 MG/5ML PO SUSP
30.0000 mL | ORAL | Status: DC | PRN
  Administered 2021-07-25 – 2021-07-28 (×3): 30 mL via ORAL
  Filled 2021-07-25 (×3): qty 30

## 2021-07-25 MED ORDER — ORAL CARE MOUTH RINSE
15.0000 mL | Freq: Two times a day (BID) | OROMUCOSAL | Status: DC
  Administered 2021-07-25 – 2021-08-04 (×18): 15 mL via OROMUCOSAL

## 2021-07-25 MED ORDER — CHLORHEXIDINE GLUCONATE CLOTH 2 % EX PADS
6.0000 | MEDICATED_PAD | Freq: Every day | CUTANEOUS | Status: DC
  Administered 2021-07-25 – 2021-08-11 (×13): 6 via TOPICAL

## 2021-07-25 NOTE — Progress Notes (Signed)
PROGRESS NOTE    Jacob Bishop  WER:[id number] DOB: [birth year] DOA: 07/24/2021 PCP: [name], NP   Chief Complaint  Patient presents with   Shortness of Breath    Brief Narrative: 80 yo with hx chronic hypoxic resp failure with covid 19 infection, CAD, OSA on CPA, T2DM, HTN, thrombocytopenia, obesity, liver and lung lesions concerning for metastatic disease and multiple other medical issues who presented with increased SOB and O2 requirement found to have and acute pulmonary embolism.   Assessment & Plan:   Principal Problem:   Acute pulmonary embolus (HCC) Active Problems:   Obstructive sleep apnea   Essential hypertension   CAD, NATIVE VESSEL   Acute on chronic respiratory failure with hypoxia (HCC)   Mass of lung   Lesion of liver   DM type 2 with diabetic mixed hyperlipidemia (HCC)   Thrombocytopenia (HCC)  * Acute pulmonary embolus (HCC) Baseline 2 L, now on 5 L CT PE with small segmental arterial embolus in RUL (subsegmental and segmental arteries in lower lobes obscured by breathing motion, consolidation/atelectasis), small pleural effusions Heparin gtt Echo, LE [place] pending Likely provoked in setting of suspected metastatic disease and recent covid 19 infection  Acute on chronic respiratory failure with hypoxia ([name]) Discharged on 2 L after COVID 19 infection, now requiring 5 L with PE  Mass of lung Bilateral lung masses concerning for metastatic disease Was supposed to get PET scan today  Lesion of liver 4.6 cm density in caudate lobe and 2.5 cm lesion in L lobe of liver Concerning for metastatic disease   DM type 2 with diabetic mixed hyperlipidemia (HCC) Basal, bolus, SSI a1c 6.2 11/7  Thrombocytopenia ([place]) Noted, follow - related to cirrhosis? (imaging findings from 05/2021 CT with nodular hepatic contours suggesting cirrhosis)  Depression wellbutrin   DVT prophylaxis:  heparin gtt Code Status: full  Family Communication: none   Disposition:   Status is: Inpatient  Remains inpatient appropriate because: need for heparin, workup malignancy       Consultants:  none  Procedures:  none  Antimicrobials: Anti-infectives (From admission, onward)    None          Subjective: Says he doesn't want to leave until he's better  Objective: Vitals:   07/25/21 0400 07/25/21 0500 07/25/21 0600 07/25/21 0800  BP: 125/61 134/86 114/63 133/78  Pulse: 85 84 84 99  Resp: (!) 26 (!) 26 (!) 21 (!) 27  Temp:      TempSrc:      SpO2: 92% 91% 92% 92%   No intake or output data in the 24 hours ending 07/25/21 0939 There were no vitals filed for this visit.  Examination:  General: No acute distress. Cardiovascular: RRR Lungs: unlabored on 5 L, distant Abdomen: Soft, nontender, nondistended Neurological: Alert and oriented 3. Moves all extremities 4 with equal strength. Cranial nerves II through XII grossly intact. Skin: Warm and dry. No rashes or lesions. Extremities: bilateral R slightly greater than LLE edema   Data Reviewed: I have personally reviewed following labs and imaging studies  CBC: Recent Labs  Lab 07/24/21 1800 07/25/21 0432  WBC 7.0 7.0  NEUTROABS 5.5  --   HGB 13.2 11.7*  HCT 39.7 35.2*  MCV 94.3 93.4  PLT 118* 107*    Basic Metabolic Panel: Recent Labs  Lab 07/24/21 1800 07/25/21 0432  NA 135 134*  K 3.6 3.6  CL 93* 99  CO2 32 30  GLUCOSE 143* 119*  BUN 12 12  CREATININE  0.89 0.78  CALCIUM 8.4* 7.6*  MG  --  2.1    GFR: Estimated Creatinine Clearance: 99.9 mL/min (by C-G formula based on SCr of 0.78 mg/dL).  Liver Function Tests: Recent Labs  Lab 07/24/21 1800 07/25/21 0432  AST 24 22  ALT 30 23  ALKPHOS 206* 184*  BILITOT 1.5* 1.1  PROT 7.3 6.1*  ALBUMIN 3.1* 2.5*    CBG: Recent Labs  Lab 07/24/21 2349 07/25/21 0800  GLUCAP 133* 118*     Recent Results (from the past 240 hour(s))  Resp Panel by RT-PCR (Flu Zenas Santa&B, Covid) Nasopharyngeal Swab      Status: None   Collection Time: 07/24/21  6:00 PM   Specimen: Nasopharyngeal Swab; Nasopharyngeal(NP) swabs in vial transport medium  Result Value Ref Range Status   SARS Coronavirus 2 by RT PCR NEGATIVE NEGATIVE Final    Comment: (NOTE) SARS-CoV-2 target nucleic acids are NOT DETECTED.  The SARS-CoV-2 RNA is generally detectable in upper respiratory specimens during the acute phase of infection. The lowest concentration of SARS-CoV-2 viral copies this assay can detect is 138 copies/mL. Miana Politte negative result does not preclude SARS-Cov-2 infection and should not be used as the sole basis for treatment or other patient management decisions. Jeree Delcid negative result may occur with  improper specimen collection/handling, submission of specimen other than nasopharyngeal swab, presence of viral mutation(s) within the areas targeted by this assay, and inadequate number of viral copies(<138 copies/mL). Maurie Musco negative result must be combined with clinical observations, patient history, and epidemiological information. The expected result is Negative.  Fact Sheet for Patients:  EntrepreneurPulse.com.au  Fact Sheet for Healthcare Providers:  IncredibleEmployment.be  This test is no t yet approved or cleared by the [place] FDA and  has been authorized for detection and/or diagnosis of SARS-CoV-2 by FDA under an Emergency Use Authorization (EUA). This EUA will remain  in effect (meaning this test can be used) for the duration of the COVID-19 declaration under Section 564(b)(1) of the Act, 21 U.S.C.section 360bbb-3(b)(1), unless the authorization is terminated  or revoked sooner.       Influenza Legend Tumminello by PCR NEGATIVE NEGATIVE Final   Influenza B by PCR NEGATIVE NEGATIVE Final    Comment: (NOTE) The Xpert Xpress SARS-CoV-2/FLU/RSV plus assay is intended as an aid in the diagnosis of influenza from Nasopharyngeal swab specimens and should not be used as July Nickson sole basis  for treatment. Nasal washings and aspirates are unacceptable for Xpert Xpress SARS-CoV-2/FLU/RSV testing.  Fact Sheet for Patients: EntrepreneurPulse.com.au  Fact Sheet for Healthcare Providers: IncredibleEmployment.be  This test is not yet approved or cleared by the [place] FDA and has been authorized for detection and/or diagnosis of SARS-CoV-2 by FDA under an Emergency Use Authorization (EUA). This EUA will remain in effect (meaning this test can be used) for the duration of the COVID-19 declaration under Section 564(b)(1) of the Act, 21 U.S.C. section 360bbb-3(b)(1), unless the authorization is terminated or revoked.  Performed at [facility], [place] [address], [place], [place] [address]          Radiology Studies: CT Angio Chest PE W and/or Wo Contrast  Result Date: 07/24/2021 CLINICAL DATA:  Shortness of breath and hypoxia. EXAM: CT ANGIOGRAPHY CHEST WITH CONTRAST TECHNIQUE: Multidetector CT imaging of the chest was performed using the standard protocol during bolus administration of intravenous contrast. Multiplanar CT image reconstructions and MIPs were obtained to evaluate the vascular anatomy. CONTRAST:  120mL OMNIPAQUE IOHEXOL 350 MG/ML SOLN COMPARISON:  Portable chest  today, portable chest 06/29/2021, CTA chest 06/24/2021. FINDINGS: Cardiovascular: Small pericardial effusion is unchanged. There is mild cardiomegaly with moderate to heavy three-vessel calcific CAD, mild patchy calcification in the aorta without dissection or great vessel stenosis. Stable ectatic ascending aorta measuring 3.8 cm the remainder normal in caliber. Upper limit of normal pulmonary trunk caliber is seen with small hypodense embolus in an anterior right upper lobe segmental artery on series 5 axial 39. Other visualized pulmonary arteries are clear but the segmental and subsegmental arteries in the lower lobes are obscured due to breathing  motion and atelectasis/consolidation. There is no venous dilatation , no acute right heart strain findings. Mediastinum/Nodes: There are scattered stable slightly prominent hilar lymph nodes up to 1.1 cm in short axis, right paratracheal adenopathy with index lymph node 1.4 cm in short axis on series 5 axial 21, previously 1.2 cm. Julianah Marciel second index right paratracheal lymph node on axial 32 is 1.3 cm in short axis, previously 1.0 cm with similar mildly enlarged left-sided prevascular, precarinal and subcarinal lymph nodes also slightly enlarged in the interval. The trachea and central airways are clear. The esophagus and visualized thyroid are unremarkable. No axillary adenopathy. Lungs/Pleura: Small layering pleural effusions are slightly increased in the interval since October 31. There is increased patchy atelectasis or consolidation in the adjacent basal segments of both lower lobes. Numerous bilateral pulmonary nodules are again noted with several scattered small new nodules in the bilateral upper and right middle lobes and enlargement of pre-existing nodules with index anterior right apical nodule on series 11 axial 27 now measuring 2.2 cm, previously 1.9 cm, index right upper lobe base nodule laterally now measuring 2.3 cm, previously 0.8 cm. Other multiple bilateral nodules are also similarly enlarged in the interval, some more than doubled in size. Largest right lower lobe nodule is now 3.8 cm, previously 2.8 cm. Largest left lower lobe nodule is now 3.1 cm, previously 2.1 cm. Again this is highly worrisome for malignancy with numerous metastases. Upper Abdomen: There is Manuel Lawhead faint hypodensity again noted in the lateral segment left lobe liver measuring 2.5 cm on series 5 axial 117, previously 2.3 cm but not well seen on the current or prior studies. There is Jachob Mcclean hypodense lesion also in the caudate hepatic lobe again measuring 4.6 cm. The spleen is enlarged, measuring 17.7 cm coronal. Musculoskeletal: There is  osteopenia with degenerative changes of the spine and multilevel extensive spinal bridging enthesopathy, multilevel degenerative discs. No destructive thoracic bone lesion is seen. Review of the MIP images confirms the above findings. IMPRESSION: 1. Small segmental arterial embolus is seen in the right upper lobe but there are no other visible emboli, no evidence of acute right heart strain. Please note however, the subsegmental and segmental arteries in the lower lobes are obscured by breathing motion and consolidation/atelectasis. 2. Multiple mildly enlarged mediastinal and hilar lymph nodes, stable to slightly worsened in the interval. 3. Numerous bilateral lung masses, with several small new nodules, without cavitation and with interval enlargement of pre-existing nodules. Findings are highly worrisome for metastatic disease with worsening. 4. Faint 2.5 cm heterogeneous lesion in the left lobe of the liver, equivocally slightly larger than previously, measuring 2.5 cm with stable 4.6 cm lesion in the caudate lobe. Splenomegaly. 5. Osteopenia, degenerative and DISH changes. 6. Mild cardiomegaly with aortic and coronary artery atherosclerosis. Small stable pericardial effusion. 7. Small pleural effusions slightly increased since 06/24/2021 with increased consolidation or atelectasis in the adjacent lower lobe basal segments. Electronically Signed  By: Telford Nab M.D.   On: 07/24/2021 20:20   DG Chest Port 1 View  Result Date: 07/24/2021 CLINICAL DATA:  Short of breath EXAM: PORTABLE CHEST 1 VIEW COMPARISON:  06/29/2021 FINDINGS: Hypoventilation with decreased lung volume and bibasilar atelectasis. No pleural effusion. IMPRESSION: Hypoventilation with bibasilar atelectasis. Electronically Signed   By: Franchot Gallo M.D.   On: 07/24/2021 19:01        Scheduled Meds:  buPROPion  150 mg Oral Daily   insulin aspart  0-15 Units Subcutaneous TID WC   insulin aspart  0-5 Units Subcutaneous QHS    insulin aspart  3 Units Subcutaneous TID WC   insulin glargine-yfgn  25 Units Subcutaneous QHS   oxybutynin  10 mg Oral Daily   simvastatin  20 mg Oral q1800   sodium chloride flush  3 mL Intravenous Q12H   tamsulosin  0.4 mg Oral BID   Continuous Infusions:  heparin 1,850 Units/hr (07/25/21 0501)     LOS: 1 day    Time spent: over 30 min    Fayrene Helper, MD [facility]   To contact the attending provider between 7A-7P or the covering provider during after hours 7P-7A, please log into the web site [email or web address] and access using universal [place] password for that web site. If you do not have the password, please call the hospital operator.  07/25/2021, 9:39 AM

## 2021-07-25 NOTE — Progress Notes (Signed)
Bilateral lower extremity venous duplex completed. Refer to "CV Proc" under chart review to view preliminary results.  07/25/2021 10:08 AM Kelby Aline., MHA, RVT, RDCS, RDMS

## 2021-07-25 NOTE — Progress Notes (Addendum)
ANTICOAGULATION CONSULT NOTE - follow up  Pharmacy Consult for Heparin Indication: pulmonary embolus, DVT's in R leg  Allergies  Allergen Reactions   Lipitor [Atorvastatin Calcium] Other (See Comments)    Muscle soreness   Oxycodone Anxiety    Causes patient to become shaky and dizzy. Unable to tolerate.    Patient Measurements:   Heparin Dosing Weight:   Vital Signs: BP: 122/63 (12/01 1203) Pulse Rate: 81 (12/01 1203)  Labs: Recent Labs    07/24/21 1800 07/24/21 2050 07/24/21 2349 07/25/21 0432 07/25/21 1300  HGB 13.2  --   --  11.7*  --   HCT 39.7  --   --  35.2*  --   PLT 118*  --   --  107*  --   APTT  --  27  --   --   --   LABPROT  --  15.4*  --   --   --   INR  --  1.2  --   --   --   HEPARINUNFRC  --   --   --  0.27* 0.54  CREATININE 0.89  --   --  0.78  --   TROPONINIHS  --   --  10  --   --      Estimated Creatinine Clearance: 99.9 mL/min (by C-G formula based on SCr of 0.78 mg/dL).   Medical History: Past Medical History:  Diagnosis Date   AI (aortic insufficiency) 01/11/2018   Trace, noted on ECHO   Arthritis    CAD in native artery    Nuclear stress test 10/19: EF 58, normal perfusion, low risk   Cellulitis    Dermatitis    Diabetes mellitus    type II   Diastolic dysfunction 11/91/4782   Mild, noted on ECHO   Diverticulosis 01/31/2016   ASCENDING COLON AND CECUM, NOTED ON COLONOSCOPY   DOE (dyspnea on exertion)    Edema    Fatty liver 09/18/2004   Noted on [place] ABD   History of kidney stones 05/23/2002   Noted on CT Abd   Hx of colonic polyps 01/31/2016   Hyperlipidemia    Hypertension    Internal hemorrhoids 01/31/2016   Noted on colonoscopy   Obesity    OSA (obstructive sleep apnea)    cpap   Positional vertigo    Pulmonary hypertension ([place]) 01/11/2018   Mild, noted on ECHO   Rhinosinusitis    Skin lesion    TR (tricuspid regurgitation) 01/11/2018   Trace, noted on ECHO    Medications:  Infusions:   Assessment: 21  yoM with PMH of recent Covid pneumonia (admitted 11/6-11/8), OSA, CAD, DM, HTN presents to ED with SOB.  CT angio with pulmonary embolus.  Preliminary lower extremity dopplers with acute DVT in R mid and distal femoral vein, R popliteal vein, R posterior tibial veins, and R peroneal veins. Pharmacy is consulted to dose heparin for PE.   No prior anticoagulation Baseline coags: aPTT 27 seconds, INR 1.2 Baseline CBC:  Hgb 13.2, Plt 118 (chronic thrombocytopenia)  07/25/2021 HL 0.54 which is therapeutic on 1850 units/hr Hgb 11.7, Plts 107 (chronic thrombocytopenia) No interruptions with infusion or bleeding reported  Goal of Therapy:  Heparin level 0.3-0.7 units/ml Monitor platelets by anticoagulation protocol: Yes   Plan:  Continue heparin infusion at 1850 units/hr Daily heparin level and CBC Monitor for signs of bleeding F/u ability to transition to PO anticoagulation  Dimple Nanas, PharmD 07/25/2021 2:28 PM

## 2021-07-25 NOTE — Progress Notes (Signed)
Pt refused nocturnal cpap tonight.  Pt stated he cant tolerate the masks that fit over his nose because it "tears his nose up."  This writer offered to put padding over nose area to prevent soreness/breakdown but pt still refused stating he's tried that at home already, and it doesn't work.  Pt was advised that RT is available all night should he change his mind.

## 2021-07-25 NOTE — Progress Notes (Signed)
ANTICOAGULATION CONSULT NOTE - follow up  Pharmacy Consult for Heparin Indication: pulmonary embolus  Allergies  Allergen Reactions   Lipitor [Atorvastatin Calcium] Other (See Comments)    Muscle soreness   Oxycodone Anxiety    Causes patient to become shaky and dizzy. Unable to tolerate.    Patient Measurements:   Heparin Dosing Weight:   Vital Signs: Temp: 97.7 F (36.5 C) (11/30 1700) Temp Source: Oral (11/30 1700) BP: 125/61 (12/01 0400) Pulse Rate: 85 (12/01 0400)  Labs: Recent Labs    07/24/21 1800 07/24/21 2050 07/24/21 2349 07/25/21 0432  HGB 13.2  --   --  11.7*  HCT 39.7  --   --  35.2*  PLT 118*  --   --  107*  APTT  --  27  --   --   LABPROT  --  15.4*  --   --   INR  --  1.2  --   --   HEPARINUNFRC  --   --   --  0.27*  CREATININE 0.89  --   --   --   TROPONINIHS  --   --  10  --      Estimated Creatinine Clearance: 89.8 mL/min (by C-G formula based on SCr of 0.89 mg/dL).   Medical History: Past Medical History:  Diagnosis Date   AI (aortic insufficiency) 01/11/2018   Trace, noted on ECHO   Arthritis    CAD in native artery    Nuclear stress test 10/19: EF 58, normal perfusion, low risk   Cellulitis    Dermatitis    Diabetes mellitus    type II   Diastolic dysfunction 48/18/5631   Mild, noted on ECHO   Diverticulosis 01/31/2016   ASCENDING COLON AND CECUM, NOTED ON COLONOSCOPY   DOE (dyspnea on exertion)    Edema    Fatty liver 09/18/2004   Noted on [place] ABD   History of kidney stones 05/23/2002   Noted on CT Abd   Hx of colonic polyps 01/31/2016   Hyperlipidemia    Hypertension    Internal hemorrhoids 01/31/2016   Noted on colonoscopy   Obesity    OSA (obstructive sleep apnea)    cpap   Positional vertigo    Pulmonary hypertension ([place]) 01/11/2018   Mild, noted on ECHO   Rhinosinusitis    Skin lesion    TR (tricuspid regurgitation) 01/11/2018   Trace, noted on ECHO    Medications:  Infusions:   Assessment: 25 yoM  with PMH of recent Covid pneumonia (admitted 11/6-11/8), OSA, CAD, DM, HTN presents to ED with SOB.  Pharmacy is consulted to dose heparin for PE.   No prior anticoagulation Baseline coags pending. CBC:  Hgb 13.2, Plt 118 (chronic thrombocytopenia)  07/25/2021 HL 0.24 subtherapeutic on 1650 units/hr Hgb 11.7,Plts 107 (chronic thrombocytopenia) Per RN no bleeding or interruptions  Goal of Therapy:  Heparin level 0.3-0.7 units/ml Monitor platelets by anticoagulation protocol: Yes   Plan:  increase heparin IV infusion to 1850 units/hr Heparin level in 8 hours Daily heparin level and CBC   Dolly Rias RPh 07/25/2021, 5:00 AM

## 2021-07-25 NOTE — Progress Notes (Signed)
Pt states that he does not want CPAP QHS tonight.  Pt has bee refusing due to our particular Nasal mask.  Pt states at this time he would like to utilize the [place].

## 2021-07-25 NOTE — Assessment & Plan Note (Addendum)
Bilateral lung masses concerning for metastatic disease Was supposed to get PET scan 12/1

## 2021-07-25 NOTE — Assessment & Plan Note (Addendum)
Discharged on 2 L after COVID 19 infection, now requiring 10 L with PE CXR with bilateral pulm nodules I think component of increased O2 requirement is nasal congestion, will treat and try to wean

## 2021-07-25 NOTE — Assessment & Plan Note (Signed)
wellbutrin ° °

## 2021-07-25 NOTE — Progress Notes (Signed)
  Echocardiogram 2D Echocardiogram has been performed.  Jacob Bishop F 07/25/2021, 6:13 PM

## 2021-07-25 NOTE — Assessment & Plan Note (Addendum)
Baseline 2 L, now on 10 L CT PE with small segmental arterial embolus in RUL (subsegmental and segmental arteries in lower lobes obscured by breathing motion, consolidation/atelectasis), small pleural effusions Heparin gtt will keep on heparin gtt with plan for IR bx liver lesion on Monday  Echo with normal EF, RVSF normal (see report),  LE [place] with right lower extremity DVT (mid and distal femoral vein, right popliteal vein, right posterior tibial vein, right peroneal veins) Likely provoked in setting of suspected metastatic disease and recent covid 19 infection

## 2021-07-25 NOTE — Assessment & Plan Note (Signed)
cpap

## 2021-07-25 NOTE — Assessment & Plan Note (Signed)
Basal, bolus, SSI a1c 6.2 11/7

## 2021-07-25 NOTE — Assessment & Plan Note (Addendum)
4.6 cm density in caudate lobe and 2.5 cm lesion in L lobe of liver Concerning for metastatic disease IR planning for biopsy (1/3 visit with pulm, was supposed to follow up with Dr. [name], but looks like he was hospitalized in the interim with covid and I don't see subsequent visit - was supposed to have pet 12/1).  I think reasonable to pursue bx at this time inpatient with his need for long term anticoagulation and delays to this point. AFP 21.2, CEA 5.0, CA 19-9 <2

## 2021-07-25 NOTE — Assessment & Plan Note (Signed)
Noted, follow - related to cirrhosis? (imaging findings from 05/2021 CT with nodular hepatic contours suggesting cirrhosis)

## 2021-07-26 ENCOUNTER — Inpatient Hospital Stay (HOSPITAL_COMMUNITY): Payer: Medicare Other

## 2021-07-26 DIAGNOSIS — I2699 Other pulmonary embolism without acute cor pulmonale: Secondary | ICD-10-CM | POA: Diagnosis not present

## 2021-07-26 LAB — CBC WITH DIFFERENTIAL/PLATELET
Abs Immature Granulocytes: 0.05 10*3/uL (ref 0.00–0.07)
Basophils Absolute: 0 10*3/uL (ref 0.0–0.1)
Basophils Relative: 0 %
Eosinophils Absolute: 0.1 10*3/uL (ref 0.0–0.5)
Eosinophils Relative: 1 %
HCT: 34.9 % — ABNORMAL LOW (ref 39.0–52.0)
Hemoglobin: 11.5 g/dL — ABNORMAL LOW (ref 13.0–17.0)
Immature Granulocytes: 1 %
Lymphocytes Relative: 20 %
Lymphs Abs: 1.5 10*3/uL (ref 0.7–4.0)
MCH: 30.7 pg (ref 26.0–34.0)
MCHC: 33 g/dL (ref 30.0–36.0)
MCV: 93.3 fL (ref 80.0–100.0)
Monocytes Absolute: 0.8 10*3/uL (ref 0.1–1.0)
Monocytes Relative: 10 %
Neutro Abs: 5.3 10*3/uL (ref 1.7–7.7)
Neutrophils Relative %: 68 %
Platelets: 112 10*3/uL — ABNORMAL LOW (ref 150–400)
RBC: 3.74 MIL/uL — ABNORMAL LOW (ref 4.22–5.81)
RDW: 13.5 % (ref 11.5–15.5)
WBC: 7.7 10*3/uL (ref 4.0–10.5)
nRBC: 0 % (ref 0.0–0.2)

## 2021-07-26 LAB — COMPREHENSIVE METABOLIC PANEL
ALT: 23 U/L (ref 0–44)
AST: 21 U/L (ref 15–41)
Albumin: 2.5 g/dL — ABNORMAL LOW (ref 3.5–5.0)
Alkaline Phosphatase: 190 U/L — ABNORMAL HIGH (ref 38–126)
Anion gap: 7 (ref 5–15)
BUN: 13 mg/dL (ref 8–23)
CO2: 32 mmol/L (ref 22–32)
Calcium: 7.9 mg/dL — ABNORMAL LOW (ref 8.9–10.3)
Chloride: 96 mmol/L — ABNORMAL LOW (ref 98–111)
Creatinine, Ser: 0.84 mg/dL (ref 0.61–1.24)
GFR, Estimated: >60 mL/min (ref >60)
Glucose, Bld: 112 mg/dL — ABNORMAL HIGH (ref 70–99)
Potassium: 4 mmol/L (ref 3.5–5.1)
Sodium: 135 mmol/L (ref 135–145)
Total Bilirubin: 1.2 mg/dL (ref 0.3–1.2)
Total Protein: 6 g/dL — ABNORMAL LOW (ref 6.5–8.1)

## 2021-07-26 LAB — GLUCOSE, CAPILLARY
Glucose-Capillary: 108 mg/dL — ABNORMAL HIGH (ref 70–99)
Glucose-Capillary: 116 mg/dL — ABNORMAL HIGH (ref 70–99)
Glucose-Capillary: 137 mg/dL — ABNORMAL HIGH (ref 70–99)
Glucose-Capillary: 115 mg/dL — ABNORMAL HIGH (ref 70–99)

## 2021-07-26 LAB — BRAIN NATRIURETIC PEPTIDE: B Natriuretic Peptide: 52.4 pg/mL (ref 0.0–100.0)

## 2021-07-26 LAB — PHOSPHORUS: Phosphorus: 3.4 mg/dL (ref 2.5–4.6)

## 2021-07-26 LAB — HEPARIN LEVEL (UNFRACTIONATED): Heparin Unfractionated: 0.36 IU/mL (ref 0.30–0.70)

## 2021-07-26 LAB — MAGNESIUM: Magnesium: 2.2 mg/dL (ref 1.7–2.4)

## 2021-07-26 MED ORDER — GUAIFENESIN-DM 100-10 MG/5ML PO SYRP
5.0000 mL | ORAL_SOLUTION | ORAL | Status: DC | PRN
  Administered 2021-07-26 – 2021-07-29 (×2): 5 mL via ORAL
  Filled 2021-07-26 (×4): qty 10

## 2021-07-26 MED ORDER — ENSURE ENLIVE PO LIQD
237.0000 mL | ORAL | Status: DC
  Administered 2021-07-27 – 2021-08-19 (×12): 237 mL via ORAL

## 2021-07-26 MED ORDER — PROSOURCE PLUS PO LIQD
30.0000 mL | Freq: Every day | ORAL | Status: DC
  Administered 2021-07-27 – 2021-08-25 (×29): 30 mL via ORAL
  Filled 2021-07-26 (×29): qty 30

## 2021-07-26 MED ORDER — ADULT MULTIVITAMIN W/MINERALS CH
1.0000 | ORAL_TABLET | Freq: Every day | ORAL | Status: DC
  Administered 2021-07-26 – 2021-08-25 (×31): 1 via ORAL
  Filled 2021-07-26 (×31): qty 1

## 2021-07-26 MED ORDER — OXYMETAZOLINE HCL 0.05 % NA SOLN
1.0000 | Freq: Two times a day (BID) | NASAL | Status: AC
  Administered 2021-07-26 – 2021-07-29 (×6): 1 via NASAL
  Filled 2021-07-26: qty 15

## 2021-07-26 MED ORDER — SALINE SPRAY 0.65 % NA SOLN
1.0000 | NASAL | Status: DC | PRN
  Administered 2021-07-28 – 2021-08-01 (×4): 1 via NASAL
  Filled 2021-07-26: qty 44

## 2021-07-26 MED ORDER — LIP MEDEX EX OINT
TOPICAL_OINTMENT | CUTANEOUS | Status: DC | PRN
  Filled 2021-07-26 (×2): qty 7

## 2021-07-26 NOTE — Evaluation (Signed)
Clinical/Bedside Swallow Evaluation Patient Details  Name: Jacob Bishop MRN: [id number] Date of Birth: Feb 26, 1941  Today's Date: 07/26/2021 Time: SLP Start Time (ACUTE ONLY): 42 SLP Stop Time (ACUTE ONLY): 2671 SLP Time Calculation (min) (ACUTE ONLY): 29 min  Past Medical History:  Past Medical History:  Diagnosis Date   AI (aortic insufficiency) 01/11/2018   Trace, noted on ECHO   Arthritis    CAD in native artery    Nuclear stress test 10/19: EF 58, normal perfusion, low risk   Cellulitis    Dermatitis    Diabetes mellitus    type II   Diastolic dysfunction 24/58/0998   Mild, noted on ECHO   Diverticulosis 01/31/2016   ASCENDING COLON AND CECUM, NOTED ON COLONOSCOPY   DOE (dyspnea on exertion)    Edema    Fatty liver 09/18/2004   Noted on [place] ABD   History of kidney stones 05/23/2002   Noted on CT Abd   Hx of colonic polyps 01/31/2016   Hyperlipidemia    Hypertension    Internal hemorrhoids 01/31/2016   Noted on colonoscopy   Obesity    OSA (obstructive sleep apnea)    cpap   Positional vertigo    Pulmonary hypertension ([place]) 01/11/2018   Mild, noted on ECHO   Rhinosinusitis    Skin lesion    TR (tricuspid regurgitation) 01/11/2018   Trace, noted on ECHO   Past Surgical History:  Past Surgical History:  Procedure Laterality Date   CARDIAC CATHETERIZATION     COLONOSCOPY     CORONARY STENT PLACEMENT     3 stents /1997   POLYPECTOMY     TONSILLECTOMY     TOTAL KNEE ARTHROPLASTY Right 10/04/2018   Procedure: RIGHT TOTAL KNEE ARTHROPLASTY;  Surgeon: Gaynelle Arabian, MD;  Location: [facility];  Service: Orthopedics;  Laterality: Right;  Adductor Block   HPI:  80 yo male adm to [facility] with increased oxygen requirement.  Pt with PMH + for recent several day hospital admit for COVID, AI (aortic insufficiency) (01/11/2018), Arthritis, CAD in native artery, Cellulitis, Dermatitis, Diabetes mellitus, Diastolic dysfunction (33/82/5053), Diverticulosis (01/31/2016), DOE  (dyspnea on exertion), Edema, Fatty liver (09/18/2004), History of kidney stones (05/23/2002), colonic polyps (01/31/2016), Hyperlipidemia, Hypertension, Internal hemorrhoids (01/31/2016), Obesity, OSA (obstructive sleep apnea), Positional vertigo, Pulmonary hypertension ([place]) (01/11/2018), Rhinosinusitis, Skin lesion, and TR (tricuspid regurgitation) (01/11/2018).  Concerns are present for liver and lung mets concerning for metastatic disease.  Swallow eval ordered.    Assessment / Plan / Recommendation  Clinical Impression  Patient found sitting upright in bed - watching tv.  Meal tray arrived allowing SLP to observe pt with meal for evaluation.  Minimal right facial asymmetry, lingual deviation noted to the right as well as pt leaning to the right.  LIngual deviation continues even with pt position changing to midline.  Facial, labial strength appeared intact. SLP observed pt consuming his dinner, occasional cough and throat clearing noted with intake - and RR increased to low 30s.  Pt able to self feed without difficulties and denies coughing associated with intake "until he got sick".  He admits to occasional issues with sensing food lodging *pointing to distal pharynx* requiring him to expectorate food at times.  Indigestion reported "here lately" per pt. Advised pt to follow strict aspiration and esophageal precautions  including frequent rest breaks if dyspneic, masticating well  - choosing soft foods if needed, drink water with meals, etc.  Advised he especially monitor his "indigestion" and advise MD if worsens.  Given pt report of no coughing with po prior to illness - hopeful his coughing associated with po will abate as he medically improves.  All education completed - No SLP follow up needed. Thanks. SLP Visit Diagnosis: Dysphagia, unspecified (R13.10)    Aspiration Risk  Mild aspiration risk    Diet Recommendation Regular;Thin liquid   Liquid Administration via: Cup;Straw Medication  Administration: Whole meds with liquid Supervision: Patient able to self feed Compensations: Minimize environmental distractions;Small sips/bites;Slow rate Postural Changes: Seated upright at 90 degrees;Remain upright for at least 30 minutes after po intake    Other  Recommendations Oral Care Recommendations: Oral care BID    Recommendations for follow up therapy are one component of a multi-disciplinary discharge planning process, led by the attending physician.  Recommendations may be updated based on patient status, additional functional criteria and insurance authorization.  Follow up Recommendations No SLP follow up      Assistance Recommended at Discharge None  Functional Status Assessment  N/a  Frequency and Duration     N/a       Prognosis   N/a     Swallow Study   General HPI: 80 yo male adm to [facility] with increased oxygen requirement.  Pt with PMH + for recent several day hospital admit for COVID, AI (aortic insufficiency) (01/11/2018), Arthritis, CAD in native artery, Cellulitis, Dermatitis, Diabetes mellitus, Diastolic dysfunction (09/02/3233), Diverticulosis (01/31/2016), DOE (dyspnea on exertion), Edema, Fatty liver (09/18/2004), History of kidney stones (05/23/2002), colonic polyps (01/31/2016), Hyperlipidemia, Hypertension, Internal hemorrhoids (01/31/2016), Obesity, OSA (obstructive sleep apnea), Positional vertigo, Pulmonary hypertension ([place]) (01/11/2018), Rhinosinusitis, Skin lesion, and TR (tricuspid regurgitation) (01/11/2018).  Concerns are present for liver and lung mets concerning for metastatic disease.  Swallow eval ordered. Type of Study: Bedside Swallow Evaluation Previous Swallow Assessment: none Diet Prior to this Study: Regular;Thin liquids Temperature Spikes Noted: No Respiratory Status: Nasal cannula History of Recent Intubation: No Behavior/Cognition: Alert;Cooperative;Pleasant mood Oral Cavity Assessment: Within Functional Limits Oral Care Completed  by SLP: No Oral Cavity - Dentition: Adequate natural dentition Vision: Functional for self-feeding Self-Feeding Abilities: Able to feed self Patient Positioning: Upright in bed Baseline Vocal Quality: Normal Volitional Cough: Strong Volitional Swallow: Able to elicit    Oral/Motor/Sensory Function Overall Oral Motor/Sensory Function: Other (comment) Lingual Strength: Reduced (lingual deviation to the right upon protrusion)   Ice Chips Ice chips: Not tested   Thin Liquid Presentation: Cup;Self Fed Pharyngeal  Phase Impairments: Throat Clearing - Delayed Other Comments: inconsistent cough and throat clearing observed    Nectar Thick Nectar Thick Liquid: Not tested   Honey Thick Honey Thick Liquid: Not tested   Puree Puree: Not tested   Solid     Solid: Impaired Presentation: Self Fed Pharyngeal Phase Impairments: Cough - Delayed      Macario Golds 07/26/2021,5:47 PM  Kathleen Lime, MS [place] Office [phone] Pager [phone]

## 2021-07-26 NOTE — Progress Notes (Signed)
Initial Nutrition Assessment  DOCUMENTATION CODES:   Obesity unspecified  INTERVENTION:  - will order Ensure Plus High Protein once/day, each supplement provides 350 kcal and 20 grams of protein. - will order 30 ml Prosource Plus once/day, each supplement provides 100 kcal and 15 grams protein.  - will order 1 tablet multivitamin with minerals/day.   NUTRITION DIAGNOSIS:   Increased nutrient needs related to acute illness, chronic illness as evidenced by estimated needs.  GOAL:   Patient will meet greater than or equal to 90% of their needs  MONITOR:   PO intake, Supplement acceptance, Labs, Weight trends  REASON FOR ASSESSMENT:   Malnutrition Screening Tool  ASSESSMENT:   80 y.o. male with medical history of CAD, insulin-dependent DM, OSA on CPAP, on 2L O2 since recent COVID infection, lung nodules and liver lesion being worked up outpatient, HTN, CAD, HLD, arthritis, fatty liver, diverticulosis, aortic insufficiency, and tricuspid regurgitation. Patient presented to the ED with increased shortness of breath and increased O2 requirement. He had been discharged from the hospital on 11/8.  No meal completion percentages documented in the chart. Patient reports eating most of a large breakfast which consisted of grits, scrambled eggs, [place] sausage, toast, coffee, and orange juice.   He denies any chewing or swallowing difficulties at baseline but reports having some difficulty with chewing during breakfast. Patient had a frequent dry cough during RD visit which he reports occurs and/or is exacerbated by talking.   Patient and wife, who was at bedside, report that patient typically has a very good appetite but that for the past month appetite has been decreased.  He reports no changes in medications during that time, no abdominal pain or nausea. He does report change in taste and that many things taste bland and are not enjoyable.   Of note, patient tested positive for COVID-19  on 06/30/21 per documentation available in "Active FYIs".   Patient with hx of type 2 DM. He reports taking insulin daily but that he only checks CBGs every 3 days. Reading is usually 140s-150s mg/dl, but patient and wife are both unsure if this has changed in the past month.   Weight yesterday was 242 lb and weight on 11/22 was 253 lb. This indicates 11 lb weight loss (4% body weight) in the past 1.5 weeks; significant for time frame.  Patient does not meet criteria for malnutrition at this time, but is at high risk.    Labs reviewed; CBGs: 108 and 116 mg/dl, Cl: 96 mmol/l, Ca: 7.9 mg/dl.  Medications reviewed; sliding scale novolog, 3 units novolog TID, 25 units semglee/day.    NUTRITION - FOCUSED PHYSICAL EXAM:  Flowsheet Row Most Recent Value  Orbital Region No depletion  Upper Arm Region No depletion  Thoracic and Lumbar Region Unable to assess  Buccal Region Mild depletion  Temple Region Mild depletion  Clavicle Bone Region Mild depletion  Clavicle and Acromion Bone Region No depletion  Scapular Bone Region No depletion  Dorsal Hand No depletion  Patellar Region No depletion  Anterior Thigh Region No depletion  Posterior Calf Region No depletion  Edema (RD Assessment) Moderate  [BLE]  Hair Reviewed  Eyes Reviewed  Mouth Reviewed  Skin Reviewed  Nails Reviewed       Diet Order:   Diet Order             Diet heart healthy/carb modified Room service appropriate? Yes; Fluid consistency: Thin  Diet effective now  EDUCATION NEEDS:   Not appropriate for education at this time  Skin:  Skin Assessment: Skin Integrity Issues: Skin Integrity Issues:: Stage II Stage II: bilateral buttocks  Last BM:  PTA/unknown  Height:   Ht Readings from Last 1 Encounters:  07/25/21 5\' 11"  (1.803 m)    Weight:   Wt Readings from Last 1 Encounters:  07/25/21 110 kg    Estimated Nutritional Needs:  Kcal:  2000-2200 kcal Protein:  105-125  grams Fluid:  >/= 1.7 L/day     Jarome Matin, MS, RD, LDN, CNSC Inpatient Clinical Dietitian RD pager # available in [facility]  After hours/weekend pager # available in [facility]

## 2021-07-26 NOTE — Progress Notes (Signed)
Pt refuses CPAP QHS, prefers [place].

## 2021-07-26 NOTE — Progress Notes (Signed)
PROGRESS NOTE    Jacob Bishop  ZOX:[id number] DOB: 07/15/1941 DOA: 07/24/2021 PCP: [name], NP   Chief Complaint  Patient presents with   Shortness of Breath    Brief Narrative: 80 yo with hx chronic hypoxic resp failure with covid 19 infection, CAD, OSA on CPA, T2DM, HTN, thrombocytopenia, obesity, liver and lung lesions concerning for metastatic disease and multiple other medical issues who presented with increased SOB and O2 requirement found to have and acute pulmonary embolism.   Assessment & Plan:   Principal Problem:   Acute pulmonary embolus (HCC) Active Problems:   Essential hypertension   CAD, NATIVE VESSEL   Pressure injury of skin   Acute on chronic respiratory failure with hypoxia (HCC)   Mass of lung   Lesion of liver   DM type 2 with diabetic mixed hyperlipidemia (HCC)   Thrombocytopenia (HCC)   Depression   Obstructive sleep apnea  * Acute pulmonary embolus (HCC) Baseline 2 L, now on 5 L CT PE with small segmental arterial embolus in RUL (subsegmental and segmental arteries in lower lobes obscured by breathing motion, consolidation/atelectasis), small pleural effusions Heparin gtt will keep on heparin gtt with plan for IR bx liver lesion on Monday  Echo with normal EF, RVSF normal (see report),  LE [place] with right lower extremity DVT (mid and distal femoral vein, right popliteal vein, right posterior tibial vein, right peroneal veins) Likely provoked in setting of suspected metastatic disease and recent covid 19 infection  Acute on chronic respiratory failure with hypoxia (HCC) Discharged on 2 L after COVID 19 infection, now requiring 5-6 L with PE CXR with bilateral pulm nodules, increased  Mass of lung Bilateral lung masses concerning for metastatic disease Was supposed to get PET scan 12/1  Lesion of liver 4.6 cm density in caudate lobe and 2.5 cm lesion in L lobe of liver Concerning for metastatic disease IR planning for biopsy (1/3 visit  with pulm, was supposed to follow up with Dr. [name], but looks like he was hospitalized in the interim with covid and I don't see subsequent visit - was supposed to have pet 12/1).  I think reasonable to pursue bx at this time inpatient with his need for long term anticoagulation and delays to this point. AFP, CEA, CA 19-9   DM type 2 with diabetic mixed hyperlipidemia (HCC) Basal, bolus, SSI a1c 6.2 11/7  Thrombocytopenia ([place]) Noted, follow - related to cirrhosis? (imaging findings from 05/2021 CT with nodular hepatic contours suggesting cirrhosis)  Depression wellbutrin  Obstructive sleep apnea cpap   DVT prophylaxis:  heparin gtt Code Status: full  Family Communication: none  Disposition:   Status is: Inpatient  Remains inpatient appropriate because: need for heparin, workup malignancy       Consultants:  none  Procedures:  none  Antimicrobials: Anti-infectives (From admission, onward)    None          Subjective: No new complaints  Objective: Vitals:   07/26/21 0800 07/26/21 0900 07/26/21 1323 07/26/21 1400  BP: (!) 139/55 133/70 (!) 144/81   Pulse: 89 (!) 117 (!) 114 94  Resp: (!) 27 (!) 25 16 15   Temp: 98.4 F (36.9 C)     TempSrc: Oral     SpO2: (!) 89% 90% (!) 89% 90%  Weight:      Height:        Intake/Output Summary (Last 24 hours) at 07/26/2021 1554 Last data filed at 07/26/2021 1149 Gross per 24 hour  Intake  361.29 ml  Output 850 ml  Net -488.71 ml   Filed Weights   07/25/21 1500  Weight: 110 kg    Examination:  General: No acute distress. Cardiovascular: RRR Lungs: unlabored Abdomen: Soft, nontender, nondistended  Neurological: Alert and oriented 3. Moves all extremities 4 . Cranial nerves II through XII grossly intact. Skin: Warm and dry. No rashes or lesions. Extremities: R>L lower extremity edema    Data Reviewed: I have personally reviewed following labs and imaging studies  CBC: Recent Labs  Lab  07/24/21 1800 07/25/21 0432 07/26/21 0322  WBC 7.0 7.0 7.7  NEUTROABS 5.5  --  5.3  HGB 13.2 11.7* 11.5*  HCT 39.7 35.2* 34.9*  MCV 94.3 93.4 93.3  PLT 118* 107* 112*    Basic Metabolic Panel: Recent Labs  Lab 07/24/21 1800 07/25/21 0432 07/26/21 0322  NA 135 134* 135  K 3.6 3.6 4.0  CL 93* 99 96*  CO2 32 30 32  GLUCOSE 143* 119* 112*  BUN 12 12 13   CREATININE 0.89 0.78 0.84  CALCIUM 8.4* 7.6* 7.9*  MG  --  2.1 2.2  PHOS  --   --  3.4    GFR: Estimated Creatinine Clearance: 92.9 mL/min (by C-G formula based on SCr of 0.84 mg/dL).  Liver Function Tests: Recent Labs  Lab 07/24/21 1800 07/25/21 0432 07/26/21 0322  AST 24 22 21   ALT 30 23 23   ALKPHOS 206* 184* 190*  BILITOT 1.5* 1.1 1.2  PROT 7.3 6.1* 6.0*  ALBUMIN 3.1* 2.5* 2.5*    CBG: Recent Labs  Lab 07/25/21 1215 07/25/21 1646 07/25/21 2131 07/26/21 0744 07/26/21 1202  GLUCAP 103* 149* 107* 108* 116*     Recent Results (from the past 240 hour(s))  Resp Panel by RT-PCR (Flu Adaline Trejos&B, Covid) Nasopharyngeal Swab     Status: None   Collection Time: 07/24/21  6:00 PM   Specimen: Nasopharyngeal Swab; Nasopharyngeal(NP) swabs in vial transport medium  Result Value Ref Range Status   SARS Coronavirus 2 by RT PCR NEGATIVE NEGATIVE Final    Comment: (NOTE) SARS-CoV-2 target nucleic acids are NOT DETECTED.  The SARS-CoV-2 RNA is generally detectable in upper respiratory specimens during the acute phase of infection. The lowest concentration of SARS-CoV-2 viral copies this assay can detect is 138 copies/mL. Lugenia Assefa negative result does not preclude SARS-Cov-2 infection and should not be used as the sole basis for treatment or other patient management decisions. Alizey Noren negative result may occur with  improper specimen collection/handling, submission of specimen other than nasopharyngeal swab, presence of viral mutation(s) within the areas targeted by this assay, and inadequate number of viral copies(<138 copies/mL). Hassell Patras  negative result must be combined with clinical observations, patient history, and epidemiological information. The expected result is Negative.  Fact Sheet for Patients:  EntrepreneurPulse.com.au  Fact Sheet for Healthcare Providers:  IncredibleEmployment.be  This test is no t yet approved or cleared by the [place] FDA and  has been authorized for detection and/or diagnosis of SARS-CoV-2 by FDA under an Emergency Use Authorization (EUA). This EUA will remain  in effect (meaning this test can be used) for the duration of the COVID-19 declaration under Section 564(b)(1) of the Act, 21 U.S.C.section 360bbb-3(b)(1), unless the authorization is terminated  or revoked sooner.       Influenza Miriam Liles by PCR NEGATIVE NEGATIVE Final   Influenza B by PCR NEGATIVE NEGATIVE Final    Comment: (NOTE) The Xpert Xpress SARS-CoV-2/FLU/RSV plus assay is intended as an aid in the  diagnosis of influenza from Nasopharyngeal swab specimens and should not be used as Modene Andy sole basis for treatment. Nasal washings and aspirates are unacceptable for Xpert Xpress SARS-CoV-2/FLU/RSV testing.  Fact Sheet for Patients: EntrepreneurPulse.com.au  Fact Sheet for Healthcare Providers: IncredibleEmployment.be  This test is not yet approved or cleared by the [place] FDA and has been authorized for detection and/or diagnosis of SARS-CoV-2 by FDA under an Emergency Use Authorization (EUA). This EUA will remain in effect (meaning this test can be used) for the duration of the COVID-19 declaration under Section 564(b)(1) of the Act, 21 U.S.C. section 360bbb-3(b)(1), unless the authorization is terminated or revoked.  Performed at [facility], [place] [address], [place], [place] [address]   Blood culture (routine x 2)     Status: None (Preliminary result)   Collection Time: 07/24/21  6:00 PM   Specimen: BLOOD  Result  Value Ref Range Status   Specimen Description   Final    BLOOD RIGHT ANTECUBITAL Performed at [facility] [address] [place], [place] [address]    Special Requests   Final    BOTTLES DRAWN AEROBIC AND ANAEROBIC Blood Culture adequate volume Performed at [facility] [address] [place], [place] [address]    Culture   Final    NO GROWTH 1 DAY Performed at [facility], [place] [address], [place], [place] [address]    Report Status PENDING  Incomplete  Blood culture (routine x 2)     Status: None (Preliminary result)   Collection Time: 07/24/21  6:30 PM   Specimen: BLOOD  Result Value Ref Range Status   Specimen Description   Final    BLOOD LEFT ANTECUBITAL Performed at [facility] [address], [place], [place] [address]    Special Requests   Final    BOTTLES DRAWN AEROBIC AND ANAEROBIC Blood Culture adequate volume Performed at [facility] [address], [place], [place] [address]    Culture   Final    NO GROWTH 1 DAY Performed at [facility], [place] [address], [place], [place] [address]    Report Status PENDING  Incomplete  MRSA Next Gen by PCR, Nasal     Status: None   Collection Time: 07/25/21  2:59 PM   Specimen: Nasal Mucosa; Nasal Swab  Result Value Ref Range Status   MRSA by PCR Next Gen NOT DETECTED NOT DETECTED Final    Comment: (NOTE) The GeneXpert MRSA Assay (FDA approved for NASAL specimens only), is one component of Daisey Caloca comprehensive MRSA colonization surveillance program. It is not intended to diagnose MRSA infection nor to guide or monitor treatment for MRSA infections. Test performance is not FDA approved in patients less than 91 years old. Performed at [facility], [place] [address], [place], [place] [address]          Radiology Studies: CT Angio Chest PE W and/or Wo Contrast  Result Date: 07/24/2021 CLINICAL DATA:  Shortness of  breath and hypoxia. EXAM: CT ANGIOGRAPHY CHEST WITH CONTRAST TECHNIQUE: Multidetector CT imaging of the chest was performed using the standard protocol during bolus administration of intravenous contrast. Multiplanar CT image reconstructions and MIPs were obtained to evaluate the vascular anatomy. CONTRAST:  183mL OMNIPAQUE IOHEXOL 350 MG/ML SOLN COMPARISON:  Portable chest today, portable chest 06/29/2021, CTA chest 06/24/2021. FINDINGS: Cardiovascular: Small pericardial effusion is unchanged. There is mild cardiomegaly with moderate to heavy three-vessel calcific CAD, mild  patchy calcification in the aorta without dissection or great vessel stenosis. Stable ectatic ascending aorta measuring 3.8 cm the remainder normal in caliber. Upper limit of normal pulmonary trunk caliber is seen with small hypodense embolus in an anterior right upper lobe segmental artery on series 5 axial 39. Other visualized pulmonary arteries are clear but the segmental and subsegmental arteries in the lower lobes are obscured due to breathing motion and atelectasis/consolidation. There is no venous dilatation , no acute right heart strain findings. Mediastinum/Nodes: There are scattered stable slightly prominent hilar lymph nodes up to 1.1 cm in short axis, right paratracheal adenopathy with index lymph node 1.4 cm in short axis on series 5 axial 21, previously 1.2 cm. Elsia Lasota second index right paratracheal lymph node on axial 32 is 1.3 cm in short axis, previously 1.0 cm with similar mildly enlarged left-sided prevascular, precarinal and subcarinal lymph nodes also slightly enlarged in the interval. The trachea and central airways are clear. The esophagus and visualized thyroid are unremarkable. No axillary adenopathy. Lungs/Pleura: Small layering pleural effusions are slightly increased in the interval since October 31. There is increased patchy atelectasis or consolidation in the adjacent basal segments of both lower lobes. Numerous  bilateral pulmonary nodules are again noted with several scattered small new nodules in the bilateral upper and right middle lobes and enlargement of pre-existing nodules with index anterior right apical nodule on series 11 axial 27 now measuring 2.2 cm, previously 1.9 cm, index right upper lobe base nodule laterally now measuring 2.3 cm, previously 0.8 cm. Other multiple bilateral nodules are also similarly enlarged in the interval, some more than doubled in size. Largest right lower lobe nodule is now 3.8 cm, previously 2.8 cm. Largest left lower lobe nodule is now 3.1 cm, previously 2.1 cm. Again this is highly worrisome for malignancy with numerous metastases. Upper Abdomen: There is Jozalynn Noyce faint hypodensity again noted in the lateral segment left lobe liver measuring 2.5 cm on series 5 axial 117, previously 2.3 cm but not well seen on the current or prior studies. There is Gus Littler hypodense lesion also in the caudate hepatic lobe again measuring 4.6 cm. The spleen is enlarged, measuring 17.7 cm coronal. Musculoskeletal: There is osteopenia with degenerative changes of the spine and multilevel extensive spinal bridging enthesopathy, multilevel degenerative discs. No destructive thoracic bone lesion is seen. Review of the MIP images confirms the above findings. IMPRESSION: 1. Small segmental arterial embolus is seen in the right upper lobe but there are no other visible emboli, no evidence of acute right heart strain. Please note however, the subsegmental and segmental arteries in the lower lobes are obscured by breathing motion and consolidation/atelectasis. 2. Multiple mildly enlarged mediastinal and hilar lymph nodes, stable to slightly worsened in the interval. 3. Numerous bilateral lung masses, with several small new nodules, without cavitation and with interval enlargement of pre-existing nodules. Findings are highly worrisome for metastatic disease with worsening. 4. Faint 2.5 cm heterogeneous lesion in the left lobe  of the liver, equivocally slightly larger than previously, measuring 2.5 cm with stable 4.6 cm lesion in the caudate lobe. Splenomegaly. 5. Osteopenia, degenerative and DISH changes. 6. Mild cardiomegaly with aortic and coronary artery atherosclerosis. Small stable pericardial effusion. 7. Small pleural effusions slightly increased since 06/24/2021 with increased consolidation or atelectasis in the adjacent lower lobe basal segments. Electronically Signed   By: Telford Nab M.D.   On: 07/24/2021 20:20   DG CHEST PORT 1 VIEW  Result Date: 07/26/2021 CLINICAL DATA:  Shortness  of breath EXAM: PORTABLE CHEST 1 VIEW COMPARISON:  07/24/2021 FINDINGS: Persistent low lung volumes. There are bilateral nodular opacities appear increased. No significant pleural effusion. No pneumothorax. Stable cardiomediastinal contours. IMPRESSION: Bilateral pulmonary nodules appear increased since prior radiograph. Electronically Signed   By: Macy Mis M.D.   On: 07/26/2021 13:00   DG Chest Port 1 View  Result Date: 07/24/2021 CLINICAL DATA:  Short of breath EXAM: PORTABLE CHEST 1 VIEW COMPARISON:  06/29/2021 FINDINGS: Hypoventilation with decreased lung volume and bibasilar atelectasis. No pleural effusion. IMPRESSION: Hypoventilation with bibasilar atelectasis. Electronically Signed   By: Franchot Gallo M.D.   On: 07/24/2021 19:01   ECHOCARDIOGRAM COMPLETE  Result Date: 07/25/2021    ECHOCARDIOGRAM REPORT   Patient Name:   Jacob Bishop Date of Exam: 07/25/2021 Medical Rec #:  [id number]    Height:       71.0 in Accession #:    [id number]   Weight:       242.5 lb Date of Birth:  25-Feb-1941    BSA:          2.289 m Patient Age:    3 years     BP:           148/49 mmHg Patient Gender: M            HR:           102 bpm. Exam Location:  Inpatient Procedure: 2D Echo, Color Doppler, Cardiac Doppler and Intracardiac            Opacification Agent Indications:    I26.02 Pulmonary embolus  History:        Patient has no prior  history of Echocardiogram examinations.                 CAD.  Sonographer:    Merrie Roof RDCS Referring Phys: [id number] [place]  1. Extremely limited; LV and RV function appear to be normal; doppler suboptimal.  2. Left ventricular ejection fraction, by estimation, is 60 to 65%. The left ventricle has normal function. The left ventricle has no regional wall motion abnormalities. Left ventricular diastolic function could not be evaluated.  3. Right ventricular systolic function is normal. The right ventricular size is normal.  4. The mitral valve is normal in structure. No evidence of mitral valve regurgitation.  5. The aortic valve was not well visualized. Aortic valve regurgitation not well assessed.  6. Pulmonic valve regurgitation not assessed.  7. The inferior vena cava is normal in size with greater than 50% respiratory variability, suggesting right atrial pressure of 3 mmHg. Comparison(s): No prior Echocardiogram. FINDINGS  Left Ventricle: Left ventricular ejection fraction, by estimation, is 60 to 65%. The left ventricle has normal function. The left ventricle has no regional wall motion abnormalities. Definity contrast agent was given IV to delineate the left ventricular  endocardial borders. The left ventricular internal cavity size was normal in size. Suboptimal image quality limits for assessment of left ventricular hypertrophy. Left ventricular diastolic function could not be evaluated. Right Ventricle: The right ventricular size is normal. Right vetricular wall thickness was not well visualized. Right ventricular systolic function is normal. Left Atrium: Left atrial size was normal in size. Right Atrium: Right atrial size was normal in size. Pericardium: There is no evidence of pericardial effusion. Mitral Valve: The mitral valve is normal in structure. No evidence of mitral valve regurgitation. Tricuspid Valve: The tricuspid valve is normal in structure. Tricuspid valve regurgitation  is not  demonstrated. Aortic Valve: The aortic valve was not well visualized. Aortic valve regurgitation not well assessed. Pulmonic Valve: The pulmonic valve was not assessed. Pulmonic valve regurgitation not assessed. Aorta: The aortic root was not well visualized. Venous: The inferior vena cava was not well visualized. The inferior vena cava is normal in size with greater than 50% respiratory variability, suggesting right atrial pressure of 3 mmHg. IAS/Shunts: The interatrial septum was not well visualized. Additional Comments: Extremely limited; LV and RV function appear to be normal; doppler suboptimal.  LEFT VENTRICLE PLAX 2D LVOT diam:     2.20 cm LV SV:         64 LV SV Index:   28 LVOT Area:     3.80 cm  LEFT ATRIUM           Index LA Vol (A4C): 95.9 ml 41.90 ml/m  AORTIC VALVE LVOT Vmax:   112.00 cm/s LVOT Vmean:  67.500 cm/s LVOT VTI:    0.168 m  SHUNTS Systemic VTI:  0.17 m Systemic Diam: 2.20 cm Kirk Ruths MD Electronically signed by Kirk Ruths MD Signature Date/Time: 07/25/2021/6:32:27 PM    Final    VAS [place] LOWER EXTREMITY VENOUS (DVT)  Result Date: 07/25/2021  Lower Venous DVT Study Patient Name:  PREVIN JIAN  Date of Exam:   07/25/2021 Medical Rec #: [id number]     Accession #:    [id number] Date of Birth: Jan 01, 1941     Patient Gender: M Patient Age:   43 years Exam Location:  [facility] Procedure:      VAS [place] LOWER EXTREMITY VENOUS (DVT) Referring Phys: Christia Reading OPYD --------------------------------------------------------------------------------  Indications: Pulmonary embolism.  Limitations: Poor ultrasound/tissue interface. Comparison Study: No prior study Performing Technologist: Maudry Mayhew MHA, RDMS, RVT, RDCS  Examination Guidelines: Kennady Zimmerle complete evaluation includes B-mode imaging, spectral Doppler, color Doppler, and power Doppler as needed of all accessible portions of each vessel. Bilateral testing is considered an integral part of Daymen Hassebrock complete examination. Limited  examinations for reoccurring indications may be performed as noted. The reflux portion of the exam is performed with the patient in reverse Trendelenburg.  +---------+---------------+---------+-----------+----------+--------------+ RIGHT    CompressibilityPhasicitySpontaneityPropertiesThrombus Aging +---------+---------------+---------+-----------+----------+--------------+ CFV      Full           Yes      Yes                                 +---------+---------------+---------+-----------+----------+--------------+ SFJ      Full                                                        +---------+---------------+---------+-----------+----------+--------------+ FV Prox  Full                                                        +---------+---------------+---------+-----------+----------+--------------+ FV Mid   None                    No                   Acute          +---------+---------------+---------+-----------+----------+--------------+  FV DistalNone                    No                   Acute          +---------+---------------+---------+-----------+----------+--------------+ PFV      Full                                                        +---------+---------------+---------+-----------+----------+--------------+ POP      None                    No                   Acute          +---------+---------------+---------+-----------+----------+--------------+ PTV      None                    No                   Acute          +---------+---------------+---------+-----------+----------+--------------+ PERO     None                    No                   Acute          +---------+---------------+---------+-----------+----------+--------------+   +---------+---------------+---------+-----------+----------+--------------+ LEFT     CompressibilityPhasicitySpontaneityPropertiesThrombus Aging  +---------+---------------+---------+-----------+----------+--------------+ CFV      Full           Yes      Yes                                 +---------+---------------+---------+-----------+----------+--------------+ SFJ      Full                                                        +---------+---------------+---------+-----------+----------+--------------+ FV Prox  Full                                                        +---------+---------------+---------+-----------+----------+--------------+ FV Mid   Full                                                        +---------+---------------+---------+-----------+----------+--------------+ FV DistalFull                                                        +---------+---------------+---------+-----------+----------+--------------+ PFV      Full                                                        +---------+---------------+---------+-----------+----------+--------------+  POP      Full           Yes      Yes                                 +---------+---------------+---------+-----------+----------+--------------+ PTV      Full                                                        +---------+---------------+---------+-----------+----------+--------------+ PERO     Full                                                        +---------+---------------+---------+-----------+----------+--------------+     Summary: RIGHT: - Findings consistent with acute deep vein thrombosis involving the right mid and distal femoral vein, right popliteal vein, right posterior tibial veins, and right peroneal veins. - No cystic structure found in the popliteal fossa.  LEFT: - There is no evidence of deep vein thrombosis in the lower extremity.  - No cystic structure found in the popliteal fossa.  *See table(s) above for measurements and observations. Electronically signed by Monica Martinez MD on 07/25/2021 at  5:26:00 PM.    Final         Scheduled Meds:  [START ON 07/27/2021] (feeding supplement) PROSource Plus  30 mL Oral Daily   buPROPion  150 mg Oral Daily   Chlorhexidine Gluconate Cloth  6 each Topical Daily   feeding supplement  237 mL Oral Q24H   insulin aspart  0-15 Units Subcutaneous TID WC   insulin aspart  0-5 Units Subcutaneous QHS   insulin aspart  3 Units Subcutaneous TID WC   insulin glargine-yfgn  25 Units Subcutaneous QHS   mouth rinse  15 mL Mouth Rinse BID   multivitamin with minerals  1 tablet Oral Daily   oxybutynin  10 mg Oral Daily   simvastatin  20 mg Oral q1800   sodium chloride flush  3 mL Intravenous Q12H   tamsulosin  0.4 mg Oral BID   Continuous Infusions:  heparin 1,850 Units/hr (07/26/21 1447)     LOS: 2 days    Time spent: over 30 min    Fayrene Helper, MD [facility]   To contact the attending provider between 7A-7P or the covering provider during after hours 7P-7A, please log into the web site [email or web address] and access using universal [place] password for that web site. If you do not have the password, please call the hospital operator.  07/26/2021, 3:54 PM

## 2021-07-26 NOTE — Progress Notes (Signed)
ANTICOAGULATION CONSULT NOTE - follow up  Pharmacy Consult for Heparin Indication: pulmonary embolus, DVT's in R leg  Allergies  Allergen Reactions   Lipitor [Atorvastatin Calcium] Other (See Comments)    Muscle soreness   Oxycodone Anxiety    Causes patient to become shaky and dizzy. Unable to tolerate.   Patient Measurements: Height: 5\' 11"  (180.3 cm) Weight: 110 kg (242 lb 8.1 oz) IBW/kg (Calculated) : 75.3 Heparin Dosing Weight:   Vital Signs: Temp: 97.3 F (36.3 C) (12/02 0400) Temp Source: Axillary (12/02 0400) BP: 126/51 (12/02 0600) Pulse Rate: 84 (12/02 0600)  Labs: Recent Labs    07/24/21 1800 07/24/21 2050 07/24/21 2349 07/25/21 0432 07/25/21 1300 07/26/21 0322  HGB 13.2  --   --  11.7*  --  11.5*  HCT 39.7  --   --  35.2*  --  34.9*  PLT 118*  --   --  107*  --  112*  APTT  --  27  --   --   --   --   LABPROT  --  15.4*  --   --   --   --   INR  --  1.2  --   --   --   --   HEPARINUNFRC  --   --   --  0.27* 0.54 0.36  CREATININE 0.89  --   --  0.78  --  0.84  TROPONINIHS  --   --  10  --   --   --      Estimated Creatinine Clearance: 92.9 mL/min (by C-G formula based on SCr of 0.84 mg/dL).   Medical History: Past Medical History:  Diagnosis Date   AI (aortic insufficiency) 01/11/2018   Trace, noted on ECHO   Arthritis    CAD in native artery    Nuclear stress test 10/19: EF 58, normal perfusion, low risk   Cellulitis    Dermatitis    Diabetes mellitus    type II   Diastolic dysfunction 78/24/2353   Mild, noted on ECHO   Diverticulosis 01/31/2016   ASCENDING COLON AND CECUM, NOTED ON COLONOSCOPY   DOE (dyspnea on exertion)    Edema    Fatty liver 09/18/2004   Noted on [place] ABD   History of kidney stones 05/23/2002   Noted on CT Abd   Hx of colonic polyps 01/31/2016   Hyperlipidemia    Hypertension    Internal hemorrhoids 01/31/2016   Noted on colonoscopy   Obesity    OSA (obstructive sleep apnea)    cpap   Positional vertigo     Pulmonary hypertension ([place]) 01/11/2018   Mild, noted on ECHO   Rhinosinusitis    Skin lesion    TR (tricuspid regurgitation) 01/11/2018   Trace, noted on ECHO    Medications:  Infusions:   Assessment: 61 yoM with PMH of recent Covid pneumonia (admitted 11/6-11/8), OSA, CAD, DM, HTN presents to ED with SOB.  CT angio with pulmonary embolus.  Preliminary lower extremity dopplers with acute DVT in R mid and distal femoral vein, R popliteal vein, R posterior tibial veins, and R peroneal veins. Pharmacy is consulted to dose heparin for PE.   No prior anticoagulation Baseline coags: aPTT 27 seconds, INR 1.2 Baseline CBC:  Hgb 13.2, Plt 118 (chronic thrombocytopenia)  07/26/2021 0330 HL 0.36. remains in therapeutic range on 1850 units/hr Hgb 11.7, Plts 112 today (chronic thrombocytopenia) No interruptions with infusion or bleeding reported  Goal of Therapy:  Heparin  level 0.3-0.7 units/ml Monitor platelets by anticoagulation protocol: Yes   Plan:  Continue heparin infusion at 1850 units/hr Daily heparin level and CBC Monitor for signs of bleeding F/u ability to transition to PO anticoagulation  Minda Ditto PharmD [facility] [phone] 07/26/2021, 7:11 AM

## 2021-07-26 NOTE — H&P (Signed)
Chief Complaint: Patient was seen in consultation today for anterior left lobe liver lesion biopsy at the request of Dr. Fayrene Helper  Referring Physician(s): Dr. Fayrene Helper  Supervising Physician: Mir, Sharen Heck  Patient Status: [facility]  History of Present Illness: Jacob Bishop is a 80 y.o. male with PMH of aortic insufficiency, CAD, cardiac catheterization with 3 stents, DM type II, diastolic dysfunction, diverticulosis, DOE, fatty liver, HLD, HTN and OSA.  Patient was admitted for chronic hypoxic respiratory failure found to have acute pulmonary embolism 07/24/2021.  Imaging shows bilateral lung masses and liver lesions concerning for metastatic disease.  Patient has PET scan scheduled.  Dr. Florene Glen has referred patient to IR for liver lesion biopsy.  Dr. Dwaine Gale has approved anterior left lobe liver lesion for biopsy.  CT angio chest 07/24/2021: IMPRESSION: 1. Small segmental arterial embolus is seen in the right upper lobe but there are no other visible emboli, no evidence of acute right heart strain. Please note however, the subsegmental and segmental arteries in the lower lobes are obscured by breathing motion and consolidation/atelectasis. 2. Multiple mildly enlarged mediastinal and hilar lymph nodes, stable to slightly worsened in the interval. 3. Numerous bilateral lung masses, with several small new nodules, without cavitation and with interval enlargement of pre-existing nodules. Findings are highly worrisome for metastatic disease with worsening. 4. Faint 2.5 cm heterogeneous lesion in the left lobe of the liver, equivocally slightly larger than previously, measuring 2.5 cm with stable 4.6 cm lesion in the caudate lobe. Splenomegaly. 5. Osteopenia, degenerative and DISH changes. 6. Mild cardiomegaly with aortic and coronary artery atherosclerosis. Small stable pericardial effusion. 7. Small pleural effusions slightly increased since 06/24/2021 with increased  consolidation or atelectasis in the adjacent lower lobe basal segments.     Past Medical History:  Diagnosis Date   AI (aortic insufficiency) 01/11/2018   Trace, noted on ECHO   Arthritis    CAD in native artery    Nuclear stress test 10/19: EF 58, normal perfusion, low risk   Cellulitis    Dermatitis    Diabetes mellitus    type II   Diastolic dysfunction 40/98/1191   Mild, noted on ECHO   Diverticulosis 01/31/2016   ASCENDING COLON AND CECUM, NOTED ON COLONOSCOPY   DOE (dyspnea on exertion)    Edema    Fatty liver 09/18/2004   Noted on [place] ABD   History of kidney stones 05/23/2002   Noted on CT Abd   Hx of colonic polyps 01/31/2016   Hyperlipidemia    Hypertension    Internal hemorrhoids 01/31/2016   Noted on colonoscopy   Obesity    OSA (obstructive sleep apnea)    cpap   Positional vertigo    Pulmonary hypertension ([place]) 01/11/2018   Mild, noted on ECHO   Rhinosinusitis    Skin lesion    TR (tricuspid regurgitation) 01/11/2018   Trace, noted on ECHO    Past Surgical History:  Procedure Laterality Date   CARDIAC CATHETERIZATION     COLONOSCOPY     CORONARY STENT PLACEMENT     3 stents /1997   POLYPECTOMY     TONSILLECTOMY     TOTAL KNEE ARTHROPLASTY Right 10/04/2018   Procedure: RIGHT TOTAL KNEE ARTHROPLASTY;  Surgeon: Gaynelle Arabian, MD;  Location: [facility];  Service: Orthopedics;  Laterality: Right;  Adductor Block    Allergies: Lipitor [atorvastatin calcium] and Oxycodone  Medications: Prior to Admission medications   Medication Sig Start Date End Date Taking? Authorizing  Provider  albuterol (VENTOLIN HFA) 108 (90 Base) MCG/ACT inhaler TAKE 2 PUFFS BY MOUTH EVERY 6 HOURS AS NEEDED FOR WHEEZE OR SHORTNESS OF BREATH Patient taking differently: 2 puffs every 6 (six) hours as needed for shortness of breath. 07/11/21  Yes Nafziger, Tommi Rumps, NP  buPROPion (WELLBUTRIN XL) 150 MG 24 hr tablet Take 1 tablet (150 mg total) by mouth daily. 06/14/21  Yes Nafziger,  Tommi Rumps, NP  Insulin Glargine (BASAGLAR KWIKPEN) 100 UNIT/ML INJECT 0.4 MLS (40 UNITS TOTAL) INTO THE SKIN AT BEDTIME. Patient taking differently: Inject 40 Units into the skin at bedtime. INJECT 0.4 MLS (40 UNITS TOTAL) INTO THE SKIN AT BEDTIME. 09/26/20  Yes Nafziger, Tommi Rumps, NP  meclizine (ANTIVERT) 12.5 MG tablet TAKE ONE TABLET BY MOUTH three times daily AS NEEDED FOR dizziness Patient taking differently: Take 12.5 mg by mouth 3 (three) times daily as needed. 06/18/21  Yes Nafziger, Tommi Rumps, NP  oxybutynin (DITROPAN-XL) 10 MG 24 hr tablet Take 10 mg by mouth daily. 02/29/20  Yes Ceasar Mons, MD  simvastatin (ZOCOR) 20 MG tablet Take 1 tablet (20 mg total) by mouth daily at 6 PM. Patient taking differently: Take 20 mg by mouth daily. 11/30/20  Yes Nafziger, Tommi Rumps, NP  tamsulosin (FLOMAX) 0.4 MG CAPS capsule Take 0.4 mg by mouth 2 (two) times daily. 03/07/15  Yes Ceasar Mons, MD  Insulin Pen Needle (BD PEN NEEDLE NANO U/F) 32G X 4 MM MISC USE AS DIRECTED TWICE A DAY 05/18/20   [name], NP  Misc. Devices (CAREX COCCYX CUSHION) MISC Use when sitting in chair or couch 07/16/21   Nafziger, Tommi Rumps, NP     Family History  Problem Relation Age of Onset   Cancer Mother        ? lung cancer   Heart disease Father    Heart attack Father    Heart disease Sister    Diabetes Sister    Heart attack Sister    Heart attack Brother     Social History   Socioeconomic History   Marital status: Married    Spouse name: Not on file   Number of children: Not on file   Years of education: Not on file   Highest education level: Not on file  Occupational History   Occupation: OWNER, Health and safety inspector: [employer]  Tobacco Use   Smoking status: Former    Packs/day: 1.00    Years: 20.00    Pack years: 20.00    Types: Cigarettes    Quit date: 01/10/1996    Years since quitting: 25.5   Smokeless tobacco: Never  Vaping Use   Vaping Use: Never used  Substance and  Sexual Activity   Alcohol use: No   Drug use: No   Sexual activity: Not on file  Other Topics Concern   Not on file  Social History Narrative   Grew up in [place]. Mother was [place], Father [place].   He works daily - owns a [employer]    Married for 30+ years   Has a daughter who lives in [place] Strain: Not on [employer] Insecurity: No Food Insecurity   Worried About Charity fundraiser in the Last Year: Never true   Arboriculturist in the Last Year: Never true  Transportation Needs: Not on file  Physical Activity: Inactive   Days of Exercise per Week: 0 days  Minutes of Exercise per Session: 0 min  Stress: No Stress Concern Present   Feeling of Stress : Not at all  Social Connections: Moderately Isolated   Frequency of Communication with Friends and Family: Twice a week   Frequency of Social Gatherings with Friends and Family: Twice a week   Attends Religious Services: Never   Marine scientist or Organizations: No   Attends Music therapist: Never   Marital Status: Married     Review of Systems: A 12 point ROS discussed and pertinent positives are indicated in the HPI above.  All other systems are negative.  Review of Systems  Constitutional:  Negative for chills and fever.  HENT:  Negative for nosebleeds.   Respiratory:  Positive for cough and shortness of breath.   Cardiovascular:  Positive for chest pain and leg swelling.  Gastrointestinal:  Negative for abdominal pain, blood in stool, constipation, diarrhea, nausea and vomiting.  Genitourinary:  Negative for hematuria.  Neurological:  Positive for weakness. Negative for dizziness, light-headedness and headaches.   Vital Signs: BP (!) 144/81   Pulse 94   Temp 98.4 F (36.9 C) (Oral)   Resp 15   Ht 5\' 11"  (1.803 m)   Wt 242 lb 8.1 oz (110 kg)   SpO2 90%   BMI 33.82 kg/m   Physical  Exam Constitutional:      Appearance: He is ill-appearing.  HENT:     Head: Normocephalic and atraumatic.     Mouth/Throat:     Mouth: Mucous membranes are dry.     Pharynx: Oropharynx is clear.  Cardiovascular:     Rate and Rhythm: Normal rate and regular rhythm.     Pulses: Normal pulses.     Heart sounds: Normal heart sounds. No murmur heard.   No friction rub. No gallop.  Pulmonary:     Effort: Pulmonary effort is normal. No respiratory distress.     Breath sounds: No stridor. No wheezing, rhonchi or rales.     Comments: Diminished bilaterally  Abdominal:     General: Bowel sounds are normal. There is no distension.     Tenderness: There is no abdominal tenderness. There is no guarding.  Musculoskeletal:     Right lower leg: Edema present.     Left lower leg: Edema present.  Skin:    General: Skin is warm and dry.  Neurological:     Mental Status: He is alert and oriented to person, place, and time.  Psychiatric:        Mood and Affect: Mood normal.        Behavior: Behavior normal.        Thought Content: Thought content normal.        Judgment: Judgment normal.    Imaging: CT Angio Chest PE W and/or Wo Contrast  Result Date: 07/24/2021 CLINICAL DATA:  Shortness of breath and hypoxia. EXAM: CT ANGIOGRAPHY CHEST WITH CONTRAST TECHNIQUE: Multidetector CT imaging of the chest was performed using the standard protocol during bolus administration of intravenous contrast. Multiplanar CT image reconstructions and MIPs were obtained to evaluate the vascular anatomy. CONTRAST:  15mL OMNIPAQUE IOHEXOL 350 MG/ML SOLN COMPARISON:  Portable chest today, portable chest 06/29/2021, CTA chest 06/24/2021. FINDINGS: Cardiovascular: Small pericardial effusion is unchanged. There is mild cardiomegaly with moderate to heavy three-vessel calcific CAD, mild patchy calcification in the aorta without dissection or great vessel stenosis. Stable ectatic ascending aorta measuring 3.8 cm the remainder  normal in caliber. Upper limit of  normal pulmonary trunk caliber is seen with small hypodense embolus in an anterior right upper lobe segmental artery on series 5 axial 39. Other visualized pulmonary arteries are clear but the segmental and subsegmental arteries in the lower lobes are obscured due to breathing motion and atelectasis/consolidation. There is no venous dilatation , no acute right heart strain findings. Mediastinum/Nodes: There are scattered stable slightly prominent hilar lymph nodes up to 1.1 cm in short axis, right paratracheal adenopathy with index lymph node 1.4 cm in short axis on series 5 axial 21, previously 1.2 cm. A second index right paratracheal lymph node on axial 32 is 1.3 cm in short axis, previously 1.0 cm with similar mildly enlarged left-sided prevascular, precarinal and subcarinal lymph nodes also slightly enlarged in the interval. The trachea and central airways are clear. The esophagus and visualized thyroid are unremarkable. No axillary adenopathy. Lungs/Pleura: Small layering pleural effusions are slightly increased in the interval since October 31. There is increased patchy atelectasis or consolidation in the adjacent basal segments of both lower lobes. Numerous bilateral pulmonary nodules are again noted with several scattered small new nodules in the bilateral upper and right middle lobes and enlargement of pre-existing nodules with index anterior right apical nodule on series 11 axial 27 now measuring 2.2 cm, previously 1.9 cm, index right upper lobe base nodule laterally now measuring 2.3 cm, previously 0.8 cm. Other multiple bilateral nodules are also similarly enlarged in the interval, some more than doubled in size. Largest right lower lobe nodule is now 3.8 cm, previously 2.8 cm. Largest left lower lobe nodule is now 3.1 cm, previously 2.1 cm. Again this is highly worrisome for malignancy with numerous metastases. Upper Abdomen: There is a faint hypodensity again noted in  the lateral segment left lobe liver measuring 2.5 cm on series 5 axial 117, previously 2.3 cm but not well seen on the current or prior studies. There is a hypodense lesion also in the caudate hepatic lobe again measuring 4.6 cm. The spleen is enlarged, measuring 17.7 cm coronal. Musculoskeletal: There is osteopenia with degenerative changes of the spine and multilevel extensive spinal bridging enthesopathy, multilevel degenerative discs. No destructive thoracic bone lesion is seen. Review of the MIP images confirms the above findings. IMPRESSION: 1. Small segmental arterial embolus is seen in the right upper lobe but there are no other visible emboli, no evidence of acute right heart strain. Please note however, the subsegmental and segmental arteries in the lower lobes are obscured by breathing motion and consolidation/atelectasis. 2. Multiple mildly enlarged mediastinal and hilar lymph nodes, stable to slightly worsened in the interval. 3. Numerous bilateral lung masses, with several small new nodules, without cavitation and with interval enlargement of pre-existing nodules. Findings are highly worrisome for metastatic disease with worsening. 4. Faint 2.5 cm heterogeneous lesion in the left lobe of the liver, equivocally slightly larger than previously, measuring 2.5 cm with stable 4.6 cm lesion in the caudate lobe. Splenomegaly. 5. Osteopenia, degenerative and DISH changes. 6. Mild cardiomegaly with aortic and coronary artery atherosclerosis. Small stable pericardial effusion. 7. Small pleural effusions slightly increased since 06/24/2021 with increased consolidation or atelectasis in the adjacent lower lobe basal segments. Electronically Signed   By: Telford Nab M.D.   On: 07/24/2021 20:20   DG CHEST PORT 1 VIEW  Result Date: 07/26/2021 CLINICAL DATA:  Shortness of breath EXAM: PORTABLE CHEST 1 VIEW COMPARISON:  07/24/2021 FINDINGS: Persistent low lung volumes. There are bilateral nodular opacities  appear increased. No significant pleural effusion.  No pneumothorax. Stable cardiomediastinal contours. IMPRESSION: Bilateral pulmonary nodules appear increased since prior radiograph. Electronically Signed   By: Macy Mis M.D.   On: 07/26/2021 13:00   DG Chest Port 1 View  Result Date: 07/24/2021 CLINICAL DATA:  Short of breath EXAM: PORTABLE CHEST 1 VIEW COMPARISON:  06/29/2021 FINDINGS: Hypoventilation with decreased lung volume and bibasilar atelectasis. No pleural effusion. IMPRESSION: Hypoventilation with bibasilar atelectasis. Electronically Signed   By: Franchot Gallo M.D.   On: 07/24/2021 19:01   DG Chest Port 1 View  Result Date: 06/29/2021 CLINICAL DATA:  Possible sepsis, initial encounter EXAM: PORTABLE CHEST 1 VIEW COMPARISON:  06/24/2021 FINDINGS: Inspiratory effort is slightly improved. Cardiac shadow is stable. No focal infiltrate or effusion is noted. No acute bony abnormality is seen. IMPRESSION: No active disease. Electronically Signed   By: Inez Catalina M.D.   On: 06/29/2021 23:44   ECHOCARDIOGRAM COMPLETE  Result Date: 07/25/2021    ECHOCARDIOGRAM REPORT   Patient Name:   Jacob Bishop Date of Exam: 07/25/2021 Medical Rec #:  [id number]    Height:       71.0 in Accession #:    [id number]   Weight:       242.5 lb Date of Birth:  [birth year]    BSA:          2.289 m Patient Age:    46 years     BP:           148/49 mmHg Patient Gender: M            HR:           102 bpm. Exam Location:  Inpatient Procedure: 2D Echo, Color Doppler, Cardiac Doppler and Intracardiac            Opacification Agent Indications:    I26.02 Pulmonary embolus  History:        Patient has no prior history of Echocardiogram examinations.                 CAD.  Sonographer:    Merrie Roof RDCS Referring Phys: [id number] [place]  1. Extremely limited; LV and RV function appear to be normal; doppler suboptimal.  2. Left ventricular ejection fraction, by estimation, is 60 to 65%. The left ventricle  has normal function. The left ventricle has no regional wall motion abnormalities. Left ventricular diastolic function could not be evaluated.  3. Right ventricular systolic function is normal. The right ventricular size is normal.  4. The mitral valve is normal in structure. No evidence of mitral valve regurgitation.  5. The aortic valve was not well visualized. Aortic valve regurgitation not well assessed.  6. Pulmonic valve regurgitation not assessed.  7. The inferior vena cava is normal in size with greater than 50% respiratory variability, suggesting right atrial pressure of 3 mmHg. Comparison(s): No prior Echocardiogram. FINDINGS  Left Ventricle: Left ventricular ejection fraction, by estimation, is 60 to 65%. The left ventricle has normal function. The left ventricle has no regional wall motion abnormalities. Definity contrast agent was given IV to delineate the left ventricular  endocardial borders. The left ventricular internal cavity size was normal in size. Suboptimal image quality limits for assessment of left ventricular hypertrophy. Left ventricular diastolic function could not be evaluated. Right Ventricle: The right ventricular size is normal. Right vetricular wall thickness was not well visualized. Right ventricular systolic function is normal. Left Atrium: Left atrial size was normal in size. Right Atrium: Right atrial size was  normal in size. Pericardium: There is no evidence of pericardial effusion. Mitral Valve: The mitral valve is normal in structure. No evidence of mitral valve regurgitation. Tricuspid Valve: The tricuspid valve is normal in structure. Tricuspid valve regurgitation is not demonstrated. Aortic Valve: The aortic valve was not well visualized. Aortic valve regurgitation not well assessed. Pulmonic Valve: The pulmonic valve was not assessed. Pulmonic valve regurgitation not assessed. Aorta: The aortic root was not well visualized. Venous: The inferior vena cava was not well  visualized. The inferior vena cava is normal in size with greater than 50% respiratory variability, suggesting right atrial pressure of 3 mmHg. IAS/Shunts: The interatrial septum was not well visualized. Additional Comments: Extremely limited; LV and RV function appear to be normal; doppler suboptimal.  LEFT VENTRICLE PLAX 2D LVOT diam:     2.20 cm LV SV:         64 LV SV Index:   28 LVOT Area:     3.80 cm  LEFT ATRIUM           Index LA Vol (A4C): 95.9 ml 41.90 ml/m  AORTIC VALVE LVOT Vmax:   112.00 cm/s LVOT Vmean:  67.500 cm/s LVOT VTI:    0.168 m  SHUNTS Systemic VTI:  0.17 m Systemic Diam: 2.20 cm Kirk Ruths MD Electronically signed by Kirk Ruths MD Signature Date/Time: 07/25/2021/6:32:27 PM    Final    VAS [place] LOWER EXTREMITY VENOUS (DVT)  Result Date: 07/25/2021  Lower Venous DVT Study Patient Name:  Jacob Bishop  Date of Exam:   07/25/2021 Medical Rec #: [id number]     Accession #:    [id number] Date of Birth: Feb 02, 1941     Patient Gender: M Patient Age:   89 years Exam Location:  [facility] Procedure:      VAS [place] LOWER EXTREMITY VENOUS (DVT) Referring Phys: Christia Reading OPYD --------------------------------------------------------------------------------  Indications: Pulmonary embolism.  Limitations: Poor ultrasound/tissue interface. Comparison Study: No prior study Performing Technologist: Maudry Mayhew MHA, RDMS, RVT, RDCS  Examination Guidelines: A complete evaluation includes B-mode imaging, spectral Doppler, color Doppler, and power Doppler as needed of all accessible portions of each vessel. Bilateral testing is considered an integral part of a complete examination. Limited examinations for reoccurring indications may be performed as noted. The reflux portion of the exam is performed with the patient in reverse Trendelenburg.  +---------+---------------+---------+-----------+----------+--------------+ RIGHT    CompressibilityPhasicitySpontaneityPropertiesThrombus Aging  +---------+---------------+---------+-----------+----------+--------------+ CFV      Full           Yes      Yes                                 +---------+---------------+---------+-----------+----------+--------------+ SFJ      Full                                                        +---------+---------------+---------+-----------+----------+--------------+ FV Prox  Full                                                        +---------+---------------+---------+-----------+----------+--------------+ FV Mid   None  No                   Acute          +---------+---------------+---------+-----------+----------+--------------+ FV DistalNone                    No                   Acute          +---------+---------------+---------+-----------+----------+--------------+ PFV      Full                                                        +---------+---------------+---------+-----------+----------+--------------+ POP      None                    No                   Acute          +---------+---------------+---------+-----------+----------+--------------+ PTV      None                    No                   Acute          +---------+---------------+---------+-----------+----------+--------------+ PERO     None                    No                   Acute          +---------+---------------+---------+-----------+----------+--------------+   +---------+---------------+---------+-----------+----------+--------------+ LEFT     CompressibilityPhasicitySpontaneityPropertiesThrombus Aging +---------+---------------+---------+-----------+----------+--------------+ CFV      Full           Yes      Yes                                 +---------+---------------+---------+-----------+----------+--------------+ SFJ      Full                                                         +---------+---------------+---------+-----------+----------+--------------+ FV Prox  Full                                                        +---------+---------------+---------+-----------+----------+--------------+ FV Mid   Full                                                        +---------+---------------+---------+-----------+----------+--------------+ FV DistalFull                                                        +---------+---------------+---------+-----------+----------+--------------+  PFV      Full                                                        +---------+---------------+---------+-----------+----------+--------------+ POP      Full           Yes      Yes                                 +---------+---------------+---------+-----------+----------+--------------+ PTV      Full                                                        +---------+---------------+---------+-----------+----------+--------------+ PERO     Full                                                        +---------+---------------+---------+-----------+----------+--------------+     Summary: RIGHT: - Findings consistent with acute deep vein thrombosis involving the right mid and distal femoral vein, right popliteal vein, right posterior tibial veins, and right peroneal veins. - No cystic structure found in the popliteal fossa.  LEFT: - There is no evidence of deep vein thrombosis in the lower extremity.  - No cystic structure found in the popliteal fossa.  *See table(s) above for measurements and observations. Electronically signed by Monica Martinez MD on 07/25/2021 at 5:26:00 PM.    Final     Labs:  CBC: Recent Labs    07/02/21 0500 07/24/21 1800 07/25/21 0432 07/26/21 0322  WBC 3.3* 7.0 7.0 7.7  HGB 12.6* 13.2 11.7* 11.5*  HCT 37.1* 39.7 35.2* 34.9*  PLT 90* 118* 107* 112*    COAGS: Recent Labs    06/29/21 2315 07/24/21 2050  INR 1.1 1.2  APTT 28  27    BMP: Recent Labs    07/02/21 0500 07/24/21 1800 07/25/21 0432 07/26/21 0322  NA 135 135 134* 135  K 4.7 3.6 3.6 4.0  CL 98 93* 99 96*  CO2 29 32 30 32  GLUCOSE 200* 143* 119* 112*  BUN 21 12 12 13   CALCIUM 8.3* 8.4* 7.6* 7.9*  CREATININE 0.68 0.89 0.78 0.84  GFRNONAA >60 >60 >60 >60    LIVER FUNCTION TESTS: Recent Labs    07/02/21 0500 07/24/21 1800 07/25/21 0432 07/26/21 0322  BILITOT 0.5 1.5* 1.1 1.2  AST 27 24 22 21   ALT 26 30 23 23   ALKPHOS 144* 206* 184* 190*  PROT 6.2* 7.3 6.1* 6.0*  ALBUMIN 2.8* 3.1* 2.5* 2.5*    TUMOR MARKERS: No results for input(s): AFPTM, CEA, CA199, CHROMGRNA in the last 8760 hours.  Assessment and Plan: History of aortic insufficiency, CAD, cardiac catheterization with 3 stents, DM type II, diastolic dysfunction, diverticulosis, DOE, liver, HLD, HTN and OSA.  Patient was admitted for chronic hypoxic respiratory failure found to have acute pulmonary embolism 07/24/2021.  Imaging shows bilateral lung masses and liver lesions concerning for metastatic disease.  Patient has PET scan scheduled.  Dr. Florene Glen has referred patient to IR for liver lesion biopsy.  Dr. Dwaine Gale has approved anterior left lobe liver lesion for biopsy. CT angio chest 07/24/2021: IMPRESSION: 1. Small segmental arterial embolus is seen in the right upper lobe but there are no other visible emboli, no evidence of acute right heart strain. Please note however, the subsegmental and segmental arteries in the lower lobes are obscured by breathing motion and consolidation/atelectasis. 2. Multiple mildly enlarged mediastinal and hilar lymph nodes, stable to slightly worsened in the interval. 3. Numerous bilateral lung masses, with several small new nodules, without cavitation and with interval enlargement of pre-existing nodules. Findings are highly worrisome for metastatic disease with worsening. 4. Faint 2.5 cm heterogeneous lesion in the left lobe of the  liver, equivocally slightly larger than previously, measuring 2.5 cm with stable 4.6 cm lesion in the caudate lobe. Splenomegaly. 5. Osteopenia, degenerative and DISH changes. 6. Mild cardiomegaly with aortic and coronary artery atherosclerosis. Small stable pericardial effusion. 7. Small pleural effusions slightly increased since 06/24/2021 with increased consolidation or atelectasis in the adjacent lower lobe basal segments.  Pt sitting upright in chair accompanied by his wife.  He is A&O, calm and pleasant. He is in no distress.  Pt c/o of SOB. Tentatively planned for biopsy 07/29/2021 Orders have been placed for procedure.   Risks and benefits of anterior left lobe liver lesion biopsy was discussed with the patient and/or patient's family including, but not limited to bleeding, infection, damage to adjacent structures or low yield requiring additional tests.   All of the questions were answered and there is agreement to proceed.  Consent signed and in chart.   Thank you for this interesting consult.  I greatly enjoyed [place] and look forward to participating in their care.  A copy of this report was sent to the requesting provider on this date.  Electronically Signed: Tyson Alias, NP 07/26/2021, 2:15 PM   I spent a total of 30 minutes in face to face in clinical consultation, greater than 50% of which was counseling/coordinating care for anterior left lobe liver lesion biopsy.

## 2021-07-26 NOTE — Progress Notes (Signed)
O2 sat dipping down to 88% at 1400. MD powell notified, O2 increased to 6L nasal cannula. O2 noted to occasionally dip down to 88% for the last hour. Called respiratory therapy for O2 recommendations. Instructed to escalated patient to 10L hi-flo nasal cannula. MD powell informed. Patient does not appear to be in any distress at this time, complains of nasal congestion.

## 2021-07-26 NOTE — Consult Note (Signed)
[facility] Inpatient Consult   07/26/2021  BROOKES CRAINE 1941-02-12 [id number]  [place] [facility] CM)   Patient was reviewed for less than 30 days readmission. Per review, patient currently active with embedded chronic care management at primary provider office for post hospital chronic disease management and care coordination services.   Plan: Continue to follow for progression and disposition.  Of note, [facility] Care Management services does not replace or interfere with any services that are arranged by inpatient case management or social work.   Netta Cedars, MSN, RN [facility] [employer] [phone]  Toll free office [phone]

## 2021-07-27 ENCOUNTER — Inpatient Hospital Stay (HOSPITAL_COMMUNITY): Payer: Medicare Other

## 2021-07-27 DIAGNOSIS — R079 Chest pain, unspecified: Secondary | ICD-10-CM

## 2021-07-27 DIAGNOSIS — I2699 Other pulmonary embolism without acute cor pulmonale: Secondary | ICD-10-CM | POA: Diagnosis not present

## 2021-07-27 LAB — CBC WITH DIFFERENTIAL/PLATELET
Abs Immature Granulocytes: 0.05 10*3/uL (ref 0.00–0.07)
Basophils Absolute: 0 10*3/uL (ref 0.0–0.1)
Basophils Relative: 0 %
Eosinophils Absolute: 0.1 10*3/uL (ref 0.0–0.5)
Eosinophils Relative: 1 %
HCT: 33 % — ABNORMAL LOW (ref 39.0–52.0)
Hemoglobin: 11 g/dL — ABNORMAL LOW (ref 13.0–17.0)
Immature Granulocytes: 1 %
Lymphocytes Relative: 19 %
Lymphs Abs: 1.2 10*3/uL (ref 0.7–4.0)
MCH: 30.8 pg (ref 26.0–34.0)
MCHC: 33.3 g/dL (ref 30.0–36.0)
MCV: 92.4 fL (ref 80.0–100.0)
Monocytes Absolute: 0.7 10*3/uL (ref 0.1–1.0)
Monocytes Relative: 11 %
Neutro Abs: 4.5 10*3/uL (ref 1.7–7.7)
Neutrophils Relative %: 68 %
Platelets: 111 10*3/uL — ABNORMAL LOW (ref 150–400)
RBC: 3.57 MIL/uL — ABNORMAL LOW (ref 4.22–5.81)
RDW: 13.5 % (ref 11.5–15.5)
WBC: 6.5 10*3/uL (ref 4.0–10.5)
nRBC: 0 % (ref 0.0–0.2)

## 2021-07-27 LAB — CANCER ANTIGEN 19-9: CA 19-9: <2 U/mL (ref 0–35)

## 2021-07-27 LAB — MAGNESIUM: Magnesium: 2.3 mg/dL (ref 1.7–2.4)

## 2021-07-27 LAB — GLUCOSE, CAPILLARY
Glucose-Capillary: 119 mg/dL — ABNORMAL HIGH (ref 70–99)
Glucose-Capillary: 127 mg/dL — ABNORMAL HIGH (ref 70–99)
Glucose-Capillary: 121 mg/dL — ABNORMAL HIGH (ref 70–99)
Glucose-Capillary: 181 mg/dL — ABNORMAL HIGH (ref 70–99)

## 2021-07-27 LAB — COMPREHENSIVE METABOLIC PANEL
ALT: 21 U/L (ref 0–44)
AST: 17 U/L (ref 15–41)
Albumin: 2.4 g/dL — ABNORMAL LOW (ref 3.5–5.0)
Alkaline Phosphatase: 180 U/L — ABNORMAL HIGH (ref 38–126)
Anion gap: 4 — ABNORMAL LOW (ref 5–15)
BUN: 15 mg/dL (ref 8–23)
CO2: 32 mmol/L (ref 22–32)
Calcium: 7.7 mg/dL — ABNORMAL LOW (ref 8.9–10.3)
Chloride: 96 mmol/L — ABNORMAL LOW (ref 98–111)
Creatinine, Ser: 0.75 mg/dL (ref 0.61–1.24)
GFR, Estimated: >60 mL/min (ref >60)
Glucose, Bld: 100 mg/dL — ABNORMAL HIGH (ref 70–99)
Potassium: 3.6 mmol/L (ref 3.5–5.1)
Sodium: 132 mmol/L — ABNORMAL LOW (ref 135–145)
Total Bilirubin: 1 mg/dL (ref 0.3–1.2)
Total Protein: 5.6 g/dL — ABNORMAL LOW (ref 6.5–8.1)

## 2021-07-27 LAB — CEA: CEA: 5 ng/mL — ABNORMAL HIGH (ref 0.0–4.7)

## 2021-07-27 LAB — PHOSPHORUS: Phosphorus: 3.7 mg/dL (ref 2.5–4.6)

## 2021-07-27 LAB — TROPONIN I (HIGH SENSITIVITY)
Troponin I (High Sensitivity): 9 ng/L (ref <18)
Troponin I (High Sensitivity): 9 ng/L (ref <18)

## 2021-07-27 LAB — AFP TUMOR MARKER: AFP, Serum, Tumor Marker: 21.2 ng/mL — ABNORMAL HIGH (ref 0.0–8.4)

## 2021-07-27 LAB — HEPARIN LEVEL (UNFRACTIONATED): Heparin Unfractionated: 0.37 IU/mL (ref 0.30–0.70)

## 2021-07-27 MED ORDER — PANTOPRAZOLE SODIUM 40 MG IV SOLR
40.0000 mg | Freq: Once | INTRAVENOUS | Status: AC
  Administered 2021-07-27: 40 mg via INTRAVENOUS
  Filled 2021-07-27: qty 40

## 2021-07-27 MED ORDER — LIDOCAINE VISCOUS HCL 2 % MT SOLN
15.0000 mL | Freq: Once | OROMUCOSAL | Status: AC
  Administered 2021-07-27: 15 mL via ORAL
  Filled 2021-07-27: qty 15

## 2021-07-27 MED ORDER — ALUM & MAG HYDROXIDE-SIMETH 200-200-20 MG/5ML PO SUSP
30.0000 mL | Freq: Once | ORAL | Status: AC
  Administered 2021-07-27: 30 mL via ORAL
  Filled 2021-07-27: qty 30

## 2021-07-27 MED ORDER — "THROMBI-PAD 3""X3"" EX PADS"
1.0000 | MEDICATED_PAD | Freq: Once | CUTANEOUS | Status: AC
  Administered 2021-07-27: 1 via TOPICAL
  Filled 2021-07-27: qty 1

## 2021-07-27 MED ORDER — PANTOPRAZOLE SODIUM 40 MG PO TBEC
40.0000 mg | DELAYED_RELEASE_TABLET | Freq: Every day | ORAL | Status: DC
  Administered 2021-07-28 – 2021-08-25 (×29): 40 mg via ORAL
  Filled 2021-07-27 (×29): qty 1

## 2021-07-27 MED ORDER — CALCIUM CARBONATE ANTACID 500 MG PO CHEW
1.0000 | CHEWABLE_TABLET | Freq: Every day | ORAL | Status: DC | PRN
  Administered 2021-07-27: 200 mg via ORAL
  Filled 2021-07-27: qty 1

## 2021-07-27 NOTE — Progress Notes (Signed)
ANTICOAGULATION CONSULT NOTE - follow up  Pharmacy Consult for Heparin Indication: pulmonary embolus, DVT's in R leg  Allergies  Allergen Reactions   Lipitor [Atorvastatin Calcium] Other (See Comments)    Muscle soreness   Oxycodone Anxiety    Causes patient to become shaky and dizzy. Unable to tolerate.   Patient Measurements: Height: 5\' 11"  (180.3 cm) Weight: 110 kg (242 lb 8.1 oz) IBW/kg (Calculated) : 75.3 Heparin Dosing Weight:   Vital Signs: Temp: 97.5 F (36.4 C) (12/03 0400) Temp Source: Oral (12/03 0400) BP: 120/50 (12/03 0400) Pulse Rate: 76 (12/03 0400)  Labs: Recent Labs    07/24/21 1800 07/24/21 2050 07/24/21 2349 07/25/21 0432 07/25/21 1300 07/26/21 0322 07/27/21 0326  HGB  --   --   --  11.7*  --  11.5* 11.0*  HCT  --   --   --  35.2*  --  34.9* 33.0*  PLT  --   --   --  107*  --  112* 111*  APTT  --  27  --   --   --   --   --   LABPROT  --  15.4*  --   --   --   --   --   INR  --  1.2  --   --   --   --   --   HEPARINUNFRC   < >  --   --  0.27* 0.54 0.36 0.37  CREATININE  --   --   --  0.78  --  0.84 0.75  TROPONINIHS  --   --  10  --   --   --   --    < > = values in this interval not displayed.    Estimated Creatinine Clearance: 97.6 mL/min (by C-G formula based on SCr of 0.75 mg/dL).  Medical History: Past Medical History:  Diagnosis Date   AI (aortic insufficiency) 01/11/2018   Trace, noted on ECHO   Arthritis    CAD in native artery    Nuclear stress test 10/19: EF 58, normal perfusion, low risk   Cellulitis    Dermatitis    Diabetes mellitus    type II   Diastolic dysfunction 18/84/1660   Mild, noted on ECHO   Diverticulosis 01/31/2016   ASCENDING COLON AND CECUM, NOTED ON COLONOSCOPY   DOE (dyspnea on exertion)    Edema    Fatty liver 09/18/2004   Noted on [place] ABD   History of kidney stones 05/23/2002   Noted on CT Abd   Hx of colonic polyps 01/31/2016   Hyperlipidemia    Hypertension    Internal hemorrhoids 01/31/2016    Noted on colonoscopy   Obesity    OSA (obstructive sleep apnea)    cpap   Positional vertigo    Pulmonary hypertension ([place]) 01/11/2018   Mild, noted on ECHO   Rhinosinusitis    Skin lesion    TR (tricuspid regurgitation) 01/11/2018   Trace, noted on ECHO    Medications:  Infusions:   Assessment: 30 yoM with PMH of recent Covid pneumonia (admitted 11/6-11/8), OSA, CAD, DM, HTN presents to ED with SOB.  CT angio with pulmonary embolus.  Preliminary lower extremity dopplers with acute DVT in R mid and distal femoral vein, R popliteal vein, R posterior tibial veins, and R peroneal veins. Pharmacy is consulted to dose heparin for PE.   No prior anticoagulation Baseline coags: aPTT 27 seconds, INR 1.2 Baseline CBC:  Hgb  13.2, Plt 118 (chronic thrombocytopenia)  07/27/2021 0330 HL 0.37, remains in therapeutic range on 1850 units/hr Hgb 11.7, Plts 112 today (chronic thrombocytopenia) No interruptions with infusion or bleeding reported  Goal of Therapy:  Heparin level 0.3-0.7 units/ml Monitor platelets by anticoagulation protocol: Yes   Plan:  Continue heparin infusion at 1850 units/hr Daily heparin level and CBC Monitor for signs of bleeding F/u ability to transition to PO anticoagulation after liver biopsy 12/5, stop Heparin at [place], [place] Pharm[facility] Rx [phone] 07/27/2021, 7:18 AM

## 2021-07-27 NOTE — Progress Notes (Signed)
PT Cancellation Note  Patient Details Name: Jacob Bishop MRN: [id number] DOB: Aug 21, 1941   Cancelled Treatment:    Reason Eval/Treat Not Completed:  Attempted PT eval-pt declined to participate on today. Will check back another day.    [facility]  Office: [phone] Pager: [phone]

## 2021-07-27 NOTE — Progress Notes (Signed)
Pt prefers to wear [place] over CPAP.  Pt states he is unable to utilize CPAP due to issues with masks in the past.

## 2021-07-27 NOTE — Assessment & Plan Note (Signed)
He described as "gas" pain EKG, troponin Improved with GI cocktail

## 2021-07-27 NOTE — Progress Notes (Signed)
PROGRESS NOTE    Jacob Bishop  ZTI:[id number] DOB: [birth year] DOA: 07/24/2021 PCP: Jacob Peng, NP   Chief Complaint  Patient presents with   Shortness of Breath    Brief Narrative: 80 yo with hx chronic hypoxic resp failure with covid 19 infection, CAD, OSA on CPA, T2DM, HTN, thrombocytopenia, obesity, liver and lung lesions concerning for metastatic disease and multiple other medical issues who presented with increased SOB and O2 requirement found to have and acute pulmonary embolism.  At this point, given concern for metastatic disease, planning for IR bx of liver lesion, likely Monday.  See below for additional details  Assessment & Plan:   Principal Problem:   Acute pulmonary embolus ([place]) Active Problems:   Essential hypertension   CAD, NATIVE VESSEL   Pressure injury of skin   Acute on chronic respiratory failure with hypoxia (HCC)   Mass of lung   Lesion of liver   Chest pain   Thrombocytopenia (HCC)   DM type 2 with diabetic mixed hyperlipidemia (HCC)   Depression   Obstructive sleep apnea  * Acute pulmonary embolus (HCC) Baseline 2 L, now on 10 L CT PE with small segmental arterial embolus in RUL (subsegmental and segmental arteries in lower lobes obscured by breathing motion, consolidation/atelectasis), small pleural effusions Heparin gtt will keep on heparin gtt with plan for IR bx liver lesion on Monday  Echo with normal EF, RVSF normal (see report),  LE [place] with right lower extremity DVT (mid and distal femoral vein, right popliteal vein, right posterior tibial vein, right peroneal veins) Likely provoked in setting of suspected metastatic disease and recent covid 19 infection  Acute on chronic respiratory failure with hypoxia (HCC) Discharged on 2 L after COVID 19 infection, now requiring 10 L with PE CXR with bilateral pulm nodules I think component of increased O2 requirement is nasal congestion, will treat and try to wean  Mass of lung Bilateral lung  masses concerning for metastatic disease Was supposed to get PET scan 12/1  Lesion of liver 4.6 cm density in caudate lobe and 2.5 cm lesion in L lobe of liver Concerning for metastatic disease IR planning for biopsy (1/3 visit with pulm, was supposed to follow up with Dr. [name], but looks like he was hospitalized in the interim with covid and I don't see subsequent visit - was supposed to have pet 12/1).  I think reasonable to pursue bx at this time inpatient with his need for long term anticoagulation and delays to this point. AFP 21.2, CEA 5.0, CA 19-9 <2   Chest pain He described as "gas" pain EKG, troponin Improved with GI cocktail  DM type 2 with diabetic mixed hyperlipidemia (HCC) Basal, bolus, SSI a1c 6.2 11/7  Thrombocytopenia ([place]) Noted, follow - related to cirrhosis? (imaging findings from 05/2021 CT with nodular hepatic contours suggesting cirrhosis)  Depression wellbutrin  Obstructive sleep apnea cpap   DVT prophylaxis:  heparin gtt Code Status: full  Family Communication: none  Disposition:   Status is: Inpatient  Remains inpatient appropriate because: need for heparin, workup malignancy       Consultants:  none  Procedures:  none  Antimicrobials: Anti-infectives (From admission, onward)    None          Subjective: CP improved with gi cocktail   Objective: Vitals:   07/27/21 0000 07/27/21 0400 07/27/21 0800 07/27/21 1200  BP: (!) 114/40 (!) 120/50  (!) 134/53  Pulse: 75 76  100  Resp: 19 17  (!)  34  Temp: 97.6 F (36.4 C) (!) 97.5 F (36.4 C) 98.7 F (37.1 C) 97.7 F (36.5 C)  TempSrc: Oral Oral Axillary Oral  SpO2: 95% 93%  92%  Weight:      Height:        Intake/Output Summary (Last 24 hours) at 07/27/2021 1415 Last data filed at 07/27/2021 1300 Gross per 24 hour  Intake 498.99 ml  Output 800 ml  Net -301.01 ml   Filed Weights   07/25/21 1500  Weight: 110 kg    Examination:  General: No acute  distress. Cardiovascular: RRR Lungs: unlabored Abdomen: Soft, nontender, nondistended Neurological: Alert and oriented 3. Moves all extremities 4 . Cranial nerves II through XII grossly intact. Skin: Warm and dry. No rashes or lesions. Extremities: R>L LE edema     Data Reviewed: I have personally reviewed following labs and imaging studies  CBC: Recent Labs  Lab 07/24/21 1800 07/25/21 0432 07/26/21 0322 07/27/21 0326  WBC 7.0 7.0 7.7 6.5  NEUTROABS 5.5  --  5.3 4.5  HGB 13.2 11.7* 11.5* 11.0*  HCT 39.7 35.2* 34.9* 33.0*  MCV 94.3 93.4 93.3 92.4  PLT 118* 107* 112* 111*    Basic Metabolic Panel: Recent Labs  Lab 07/24/21 1800 07/25/21 0432 07/26/21 0322 07/27/21 0326  NA 135 134* 135 132*  K 3.6 3.6 4.0 3.6  CL 93* 99 96* 96*  CO2 32 30 32 32  GLUCOSE 143* 119* 112* 100*  BUN 12 12 13 15   CREATININE 0.89 0.78 0.84 0.75  CALCIUM 8.4* 7.6* 7.9* 7.7*  MG  --  2.1 2.2 2.3  PHOS  --   --  3.4 3.7    GFR: Estimated Creatinine Clearance: 97.6 mL/min (by C-G formula based on SCr of 0.75 mg/dL).  Liver Function Tests: Recent Labs  Lab 07/24/21 1800 07/25/21 0432 07/26/21 0322 07/27/21 0326  AST 24 22 21 17   ALT 30 23 23 21   ALKPHOS 206* 184* 190* 180*  BILITOT 1.5* 1.1 1.2 1.0  PROT 7.3 6.1* 6.0* 5.6*  ALBUMIN 3.1* 2.5* 2.5* 2.4*    CBG: Recent Labs  Lab 07/26/21 1202 07/26/21 1647 07/26/21 2129 07/27/21 0726 07/27/21 1206  GLUCAP 116* 137* 115* 119* 127*     Recent Results (from the past 240 hour(s))  Resp Panel by RT-PCR (Flu [name]&B, Covid) Nasopharyngeal Swab     Status: None   Collection Time: 07/24/21  6:00 PM   Specimen: Nasopharyngeal Swab; Nasopharyngeal(NP) swabs in vial transport medium  Result Value Ref Range Status   SARS Coronavirus 2 by RT PCR NEGATIVE NEGATIVE Final    Comment: (NOTE) SARS-CoV-2 target nucleic acids are NOT DETECTED.  The SARS-CoV-2 RNA is generally detectable in upper respiratory specimens during the acute  phase of infection. The lowest concentration of SARS-CoV-2 viral copies this assay can detect is 138 copies/mL. [name] negative result does not preclude SARS-Cov-2 infection and should not be used as the sole basis for treatment or other patient management decisions. [name] negative result may occur with  improper specimen collection/handling, submission of specimen other than nasopharyngeal swab, presence of viral mutation(s) within the areas targeted by this assay, and inadequate number of viral copies(<138 copies/mL). Frenchie Pribyl negative result must be combined with clinical observations, patient history, and epidemiological information. The expected result is Negative.  Fact Sheet for Patients:  EntrepreneurPulse.com.au  Fact Sheet for Healthcare Providers:  IncredibleEmployment.be  This test is no t yet approved or cleared by the [place] FDA and  has  been authorized for detection and/or diagnosis of SARS-CoV-2 by FDA under an Emergency Use Authorization (EUA). This EUA will remain  in effect (meaning this test can be used) for the duration of the COVID-19 declaration under Section 564(b)(1) of the Act, 21 U.S.C.section 360bbb-3(b)(1), unless the authorization is terminated  or revoked sooner.       Influenza Deldrick Linch by PCR NEGATIVE NEGATIVE Final   Influenza B by PCR NEGATIVE NEGATIVE Final    Comment: (NOTE) The Xpert Xpress SARS-CoV-2/FLU/RSV plus assay is intended as an aid in the diagnosis of influenza from Nasopharyngeal swab specimens and should not be used as Desia Saban sole basis for treatment. Nasal washings and aspirates are unacceptable for Xpert Xpress SARS-CoV-2/FLU/RSV testing.  Fact Sheet for Patients: EntrepreneurPulse.com.au  Fact Sheet for Healthcare Providers: IncredibleEmployment.be  This test is not yet approved or cleared by the [place] FDA and has been authorized for detection and/or diagnosis of  SARS-CoV-2 by FDA under an Emergency Use Authorization (EUA). This EUA will remain in effect (meaning this test can be used) for the duration of the COVID-19 declaration under Section 564(b)(1) of the Act, 21 U.S.C. section 360bbb-3(b)(1), unless the authorization is terminated or revoked.  Performed at [facility], [place] [address], [place], [place] [address]   Blood culture (routine x 2)     Status: None (Preliminary result)   Collection Time: 07/24/21  6:00 PM   Specimen: BLOOD  Result Value Ref Range Status   Specimen Description   Final    BLOOD RIGHT ANTECUBITAL Performed at [place] [address], [place], [place] [address]    Special Requests   Final    BOTTLES DRAWN AEROBIC AND ANAEROBIC Blood Culture adequate volume Performed at [place] [address], [place], [place] [address]    Culture   Final    NO GROWTH 2 DAYS Performed at [place] [address], [place], [place] [address]    Report Status PENDING  Incomplete  Blood culture (routine x 2)     Status: None (Preliminary result)   Collection Time: 07/24/21  6:30 PM   Specimen: BLOOD  Result Value Ref Range Status   Specimen Description   Final    BLOOD LEFT ANTECUBITAL Performed at [place] [address], [place], [place] [address]    Special Requests   Final    BOTTLES DRAWN AEROBIC AND ANAEROBIC Blood Culture adequate volume Performed at [place] [address] [place], [place] [address]    Culture   Final    NO GROWTH 2 DAYS Performed at [facility] [place] [address] [place], [place] [address]    Report Status PENDING  Incomplete  MRSA Next Gen by PCR, Nasal     Status: None   Collection Time: 07/25/21  2:59 PM   Specimen: Nasal Mucosa; Nasal Swab  Result Value Ref Range Status   MRSA by PCR Next Gen NOT DETECTED NOT DETECTED Final    Comment: (NOTE) The GeneXpert  MRSA Assay (FDA approved for NASAL specimens only), is one component of Maymie Brunke comprehensive MRSA colonization surveillance program. It is not intended to diagnose MRSA infection nor to guide or monitor treatment for MRSA infections. Test performance is not FDA approved in patients less than 80 years old. Performed at [facility], [place] [address] [place],  [address]          Radiology Studies: DG CHEST  PORT 1 VIEW  Result Date: 07/27/2021 CLINICAL DATA:  Shortness of breath EXAM: PORTABLE CHEST 1 VIEW COMPARISON:  07/26/2021 FINDINGS: Persistent low lung volumes. Bilateral nodular opacities are similar to the prior study. No significant pleural effusion no pneumothorax. Stable cardiomediastinal contours. IMPRESSION: Similar lung aeration with bilateral pulmonary nodules. Electronically Signed   By: Macy Mis M.D.   On: 07/27/2021 09:43   DG CHEST PORT 1 VIEW  Result Date: 07/26/2021 CLINICAL DATA:  Shortness of breath EXAM: PORTABLE CHEST 1 VIEW COMPARISON:  07/24/2021 FINDINGS: Persistent low lung volumes. There are bilateral nodular opacities appear increased. No significant pleural effusion. No pneumothorax. Stable cardiomediastinal contours. IMPRESSION: Bilateral pulmonary nodules appear increased since prior radiograph. Electronically Signed   By: Macy Mis M.D.   On: 07/26/2021 13:00   ECHOCARDIOGRAM COMPLETE  Result Date: 07/25/2021    ECHOCARDIOGRAM REPORT   Patient Name:   Jacob Bishop Date of Exam: 07/25/2021 Medical Rec #:  [id number]    Height:       71.0 in Accession #:    [id number]   Weight:       242.5 lb Date of Birth:  [birth year]    BSA:          2.289 m Patient Age:    42 years     BP:           148/49 mmHg Patient Gender: M            HR:           102 bpm. Exam Location:  Inpatient Procedure: 2D Echo, Color Doppler, Cardiac Doppler and Intracardiac            Opacification Agent Indications:    I26.02 Pulmonary embolus  History:         Patient has no prior history of Echocardiogram examinations.                 CAD.  Sonographer:    Merrie Roof RDCS Referring Phys: [id number] [place]  1. Extremely limited; LV and RV function appear to be normal; doppler suboptimal.  2. Left ventricular ejection fraction, by estimation, is 60 to 65%. The left ventricle has normal function. The left ventricle has no regional wall motion abnormalities. Left ventricular diastolic function could not be evaluated.  3. Right ventricular systolic function is normal. The right ventricular size is normal.  4. The mitral valve is normal in structure. No evidence of mitral valve regurgitation.  5. The aortic valve was not well visualized. Aortic valve regurgitation not well assessed.  6. Pulmonic valve regurgitation not assessed.  7. The inferior vena cava is normal in size with greater than 50% respiratory variability, suggesting right atrial pressure of 3 mmHg. Comparison(s): No prior Echocardiogram. FINDINGS  Left Ventricle: Left ventricular ejection fraction, by estimation, is 60 to 65%. The left ventricle has normal function. The left ventricle has no regional wall motion abnormalities. Definity contrast agent was given IV to delineate the left ventricular  endocardial borders. The left ventricular internal cavity size was normal in size. Suboptimal image quality limits for assessment of left ventricular hypertrophy. Left ventricular diastolic function could not be evaluated. Right Ventricle: The right ventricular size is normal. Right vetricular wall thickness was not well visualized. Right ventricular systolic function is normal. Left Atrium: Left atrial size was normal in size. Right Atrium: Right atrial size was normal in size. Pericardium: There is no evidence of pericardial effusion. Mitral Valve: The mitral valve  is normal in structure. No evidence of mitral valve regurgitation. Tricuspid Valve: The tricuspid valve is normal in structure. Tricuspid  valve regurgitation is not demonstrated. Aortic Valve: The aortic valve was not well visualized. Aortic valve regurgitation not well assessed. Pulmonic Valve: The pulmonic valve was not assessed. Pulmonic valve regurgitation not assessed. Aorta: The aortic root was not well visualized. Venous: The inferior vena cava was not well visualized. The inferior vena cava is normal in size with greater than 50% respiratory variability, suggesting right atrial pressure of 3 mmHg. IAS/Shunts: The interatrial septum was not well visualized. Additional Comments: Extremely limited; LV and RV function appear to be normal; doppler suboptimal.  LEFT VENTRICLE PLAX 2D LVOT diam:     2.20 cm LV SV:         64 LV SV Index:   28 LVOT Area:     3.80 cm  LEFT ATRIUM           Index LA Vol (A4C): 95.9 ml 41.90 ml/m  AORTIC VALVE LVOT Vmax:   112.00 cm/s LVOT Vmean:  67.500 cm/s LVOT VTI:    0.168 m  SHUNTS Systemic VTI:  0.17 m Systemic Diam: 2.20 cm Kirk Ruths MD Electronically signed by Kirk Ruths MD Signature Date/Time: 07/25/2021/6:32:27 PM    Final         Scheduled Meds:  (feeding supplement) PROSource Plus  30 mL Oral Daily   buPROPion  150 mg Oral Daily   Chlorhexidine Gluconate Cloth  6 each Topical Daily   feeding supplement  237 mL Oral Q24H   insulin aspart  0-15 Units Subcutaneous TID WC   insulin aspart  0-5 Units Subcutaneous QHS   insulin aspart  3 Units Subcutaneous TID WC   insulin glargine-yfgn  25 Units Subcutaneous QHS   mouth rinse  15 mL Mouth Rinse BID   multivitamin with minerals  1 tablet Oral Daily   oxybutynin  10 mg Oral Daily   oxymetazoline  1 spray Each Nare BID   simvastatin  20 mg Oral q1800   sodium chloride flush  3 mL Intravenous Q12H   tamsulosin  0.4 mg Oral BID   Continuous Infusions:  heparin 1,850 Units/hr (07/27/21 1300)     LOS: 3 days    Time spent: over 30 min    Fayrene Helper, MD [facility]   To contact the attending provider between  7A-7P or the covering provider during after hours 7P-7A, please log into the web site [email or web address] and access using universal [place] password for that web site. If you do not have the password, please call the hospital operator.  07/27/2021, 2:15 PM

## 2021-07-27 NOTE — Progress Notes (Signed)
Patient began complaining of chest pain at 1245 that he describe as gas pain. Maalox given, pain did not subside. Pain on left side of the chest rating 7/10 described as sharp in nature, nonradiating. MD informed. Troponins ordered. EKG obtained showing ST 109 w/ PVCs but otherwise normal. Viscous lidocaine, additional maalox, and tums all given to patient. At 1420, patient states that pain has subsided significantly only rated a 2/10.

## 2021-07-28 DIAGNOSIS — I2699 Other pulmonary embolism without acute cor pulmonale: Secondary | ICD-10-CM | POA: Diagnosis not present

## 2021-07-28 DIAGNOSIS — I2510 Atherosclerotic heart disease of native coronary artery without angina pectoris: Secondary | ICD-10-CM | POA: Diagnosis not present

## 2021-07-28 DIAGNOSIS — J9621 Acute and chronic respiratory failure with hypoxia: Secondary | ICD-10-CM | POA: Diagnosis not present

## 2021-07-28 LAB — GLUCOSE, CAPILLARY
Glucose-Capillary: 150 mg/dL — ABNORMAL HIGH (ref 70–99)
Glucose-Capillary: 141 mg/dL — ABNORMAL HIGH (ref 70–99)
Glucose-Capillary: 138 mg/dL — ABNORMAL HIGH (ref 70–99)
Glucose-Capillary: 104 mg/dL — ABNORMAL HIGH (ref 70–99)

## 2021-07-28 LAB — COMPREHENSIVE METABOLIC PANEL
ALT: 19 U/L (ref 0–44)
AST: 18 U/L (ref 15–41)
Albumin: 2.4 g/dL — ABNORMAL LOW (ref 3.5–5.0)
Alkaline Phosphatase: 169 U/L — ABNORMAL HIGH (ref 38–126)
Anion gap: 4 — ABNORMAL LOW (ref 5–15)
BUN: 14 mg/dL (ref 8–23)
CO2: 33 mmol/L — ABNORMAL HIGH (ref 22–32)
Calcium: 7.6 mg/dL — ABNORMAL LOW (ref 8.9–10.3)
Chloride: 94 mmol/L — ABNORMAL LOW (ref 98–111)
Creatinine, Ser: 0.8 mg/dL (ref 0.61–1.24)
GFR, Estimated: >60 mL/min (ref >60)
Glucose, Bld: 119 mg/dL — ABNORMAL HIGH (ref 70–99)
Potassium: 3.9 mmol/L (ref 3.5–5.1)
Sodium: 131 mmol/L — ABNORMAL LOW (ref 135–145)
Total Bilirubin: 1 mg/dL (ref 0.3–1.2)
Total Protein: 5.8 g/dL — ABNORMAL LOW (ref 6.5–8.1)

## 2021-07-28 LAB — MAGNESIUM: Magnesium: 2.4 mg/dL (ref 1.7–2.4)

## 2021-07-28 LAB — PHOSPHORUS: Phosphorus: 3.4 mg/dL (ref 2.5–4.6)

## 2021-07-28 LAB — HEPARIN LEVEL (UNFRACTIONATED): Heparin Unfractionated: 0.59 IU/mL (ref 0.30–0.70)

## 2021-07-28 NOTE — Progress Notes (Signed)
Report given to 4W RN; Pt transported via Bed with 02 tank; Pt denies pain/SOB during transport.  Pt A&Ox4, respirations regular, unlabored.  All belongings transported with pt, including cell phone and charger.  Pt received at bedside by 4W RN/Tech.

## 2021-07-28 NOTE — Evaluation (Addendum)
Physical Therapy Evaluation Patient Details Name: Jacob Bishop MRN: [id number] DOB: [birth year] Today's Date: 07/28/2021  History of Present Illness  80 yo male admitted with PE, LE DVT. Hx of COVID, O2 dep at baseline-2L  Clinical Impression  On eval, pt required Mod A for mobility. He was able to take a few steps over to the recliner using a RW. Activity limited by drop in O2 sat level-80% on monitor on 11L HFNC (poor waveform so unsure of accuracy). O2 89% on 11 HFNC end of session. Family was present during session. Wife would like pt to be considered for SNF rehab-pt seems agreeable. PT recommendation is for ST SNF rehab. Will plan to follow pt during hospital stay.        Recommendations for follow up therapy are one component of a multi-disciplinary discharge planning process, led by the attending physician.  Recommendations may be updated based on patient status, additional functional criteria and insurance authorization.  Follow Up Recommendations Skilled nursing-short term rehab (<3 hours/day)    Assistance Recommended at Discharge Frequent or constant Supervision/Assistance  Functional Status Assessment Patient has had a recent decline in their functional status and demonstrates the ability to make significant improvements in function in a reasonable and predictable amount of time.  Equipment Recommendations  None recommended by PT    Recommendations for Other Services       Precautions / Restrictions Precautions Precaution Comments: monitor O2-currently on HFNC Restrictions Weight Bearing Restrictions: No      Mobility  Bed Mobility Overal bed mobility: Needs Assistance Bed Mobility: Supine to Sit     Supine to sit: Mod assist;HOB elevated     General bed mobility comments: Assist for trunk and LEs. Utilized bedpad to aid with scooting, positioning. Increased time.    Transfers Overall transfer level: Needs assistance Equipment used: Rolling walker (2  wheels) Transfers: Sit to/from Stand;Bed to chair/wheelchair/BSC Sit to Stand: From elevated surface;Min assist   Step pivot transfers: Min assist       General transfer comment: Assist to power up, stabilize, control descent. Step pivot over to recliner from bed. Increased time. Cues for safety, technique, hand placement. O2 down to ~low 80s% on 11 HFNC (o2 reading did fluctuate quite a bit/poor waveform so unsure of accuracy)    Ambulation/Gait               General Gait Details: NT on today-O2 sat drop and will likely need +2 for safety  Stairs            Wheelchair Mobility    Modified Rankin (Stroke Patients Only)       Balance Overall balance assessment: Needs assistance         Standing balance support: Bilateral upper extremity supported Standing balance-Leahy Scale: Poor                               Pertinent Vitals/Pain Pain Assessment: Faces Faces Pain Scale: Hurts a little bit Pain Location: chest Pain Descriptors / Indicators: Discomfort;Sore Pain Intervention(s): Limited activity within patient's tolerance;Monitored during session;Repositioned    Home Living Family/patient expects to be discharged to:: Skilled nursing facility Living Arrangements: Spouse/significant other Available Help at Discharge: Family;Available 24 hours/day Type of Home: House Home Access: Stairs to enter   Entrance Stairs-Number of Steps: 3 Alternate Level Stairs-Number of Steps: flight, pt has stair lift Home Layout: Two level;Bed/bath upstairs Home Equipment: Rolling Walker (2 wheels);Cane -  single point      Prior Function Prior Level of Function : Needs assist             Mobility Comments: using RW for household ambulation ADLs Comments: wife assist with bathing, dressing     Hand Dominance        Extremity/Trunk Assessment   Upper Extremity Assessment Upper Extremity Assessment: Generalized weakness    Lower Extremity  Assessment Lower Extremity Assessment: Generalized weakness    Cervical / Trunk Assessment Cervical / Trunk Assessment: Normal  Communication   Communication: No difficulties  Cognition Arousal/Alertness: Awake/alert Behavior During Therapy: WFL for tasks assessed/performed Overall Cognitive Status: Within Functional Limits for tasks assessed                                          General Comments      Exercises     Assessment/Plan    PT Assessment Patient needs continued PT services  PT Problem List Decreased strength;Decreased mobility;Decreased activity tolerance;Decreased knowledge of use of DME;Decreased balance       PT Treatment Interventions DME instruction;Gait training;Therapeutic activities;Therapeutic exercise;Patient/family education;Balance training;Functional mobility training    PT Goals (Current goals can be found in the Care Plan section)  Acute Rehab PT Goals Patient Stated Goal: family is requesting rehab-pt seems agreeable PT Goal Formulation: With patient/family Time For Goal Achievement: 08/11/21 Potential to Achieve Goals: Good    Frequency Min 2X/week   Barriers to discharge        Co-evaluation               AM-PAC PT "6 Clicks" Mobility  Outcome Measure Help needed turning from your back to your side while in a flat bed without using bedrails?: A Little Help needed moving from lying on your back to sitting on the side of a flat bed without using bedrails?: A Lot Help needed moving to and from a bed to a chair (including a wheelchair)?: A Little Help needed standing up from a chair using your arms (e.g., wheelchair or bedside chair)?: A Lot Help needed to walk in hospital room?: A Lot Help needed climbing 3-5 steps with a railing? : Total 6 Click Score: 13    End of Session Equipment Utilized During Treatment: Gait belt Activity Tolerance: Patient limited by fatigue (Limited by drop in O2 sat level) Patient  left: in chair;with call bell/phone within reach;with family/visitor present   PT Visit Diagnosis: Muscle weakness (generalized) (M62.81);Difficulty in walking, not elsewhere classified (R26.2)    Time: 5732-2025 PT Time Calculation (min) (ACUTE ONLY): 24 min   Charges:   PT Evaluation $PT Eval Moderate Complexity: 1 Mod PT Treatments $Therapeutic Activity: 8-22 mins           Doreatha Massed, PT Acute Rehabilitation  Office: [phone] Pager: [phone]

## 2021-07-28 NOTE — Progress Notes (Signed)
  Patient received from ICU.    07/28/21 0414  Vitals  Temp 97.9 F (36.6 C)  Temp Source Oral  BP (!) 140/57  MAP (mmHg) 79  BP Location Left Arm  BP Method Automatic  Patient Position (if appropriate) Lying  Pulse Rate 63  Pulse Rate Source Monitor  Resp 19  MEWS COLOR  MEWS Score Color Green  Oxygen Therapy  SpO2 93 %  O2 Device HFNC  O2 Flow Rate (L/min) 12 L/min

## 2021-07-28 NOTE — Progress Notes (Signed)
PROGRESS NOTE  Jacob Bishop XMI:[id number] DOB: 1940/10/09 DOA: 07/24/2021 PCP: [name], NP  HPI/Recap of past 27 hours: 80 year old male with chronic hypoxic respiratory failure with COVID 19 infection, coronary disease, obstructive sleep apnea, type 2 diabetes mellitus, hypertension, thrombocytopenia, obesity, liver and lung lesions concerning for metastasis he presented with increased shortness of breath and O2 requirement and found to have acute pulmonary embolism Given he is concern for metastatic disease IR is planning on doing biopsy of the liver lesion on Monday Subjective patient seen and examined at bedside Complaining of pain in the right side when he takes a deep breath he was given Tylenol with some improvement He still requiring high flow oxygen at 10 L/min he feels comfortable  Assessment/Plan: Principal Problem:   Acute pulmonary embolus ([place]) Active Problems:   Obstructive sleep apnea   Essential hypertension   CAD, NATIVE VESSEL   Thrombocytopenia ([place])   Mass of lung   Lesion of liver   Acute on chronic respiratory failure with hypoxia ([place])   DM type 2 with diabetic mixed hyperlipidemia ([place])   Depression   Pressure injury of skin   Chest pain   Acute pulmonary embolism His baseline oxygen requirement was 2 L now he is on 10 L/min We will going to try to begin to wean him down slowly as tolerated Continue heparin IV And this will be held tomorrow for liver biopsy  Acute on chronic respiratory failure with hypoxia Patient is requiring more oxygen due to COVID-19 infection  Mass of the lung    Lesion of the liver the plan on doing biopsy on Monday   Obstructive sleep apnea Continue CPAP  Thrombocytopenia Follow-up needed may be related to liver cirrhosis CT scan of October 2022 shows hepatic contour suggestive of cirrhosis  Code Status: Full  Severity of Illness: The appropriate patient status for this patient is INPATIENT. Inpatient  status is judged to be reasonable and necessary in order to provide the required intensity of service to ensure the patient's safety. The patient's presenting symptoms, physical exam findings, and initial radiographic and laboratory data in the context of their chronic comorbidities is felt to place them at high risk for further clinical deterioration. Furthermore, it is not anticipated that the patient will be medically stable for discharge from the hospital within 2 midnights of admission.  Hypoxia still requiring high flow oxygen * I certify that at the point of admission it is my clinical judgment that the patient will require inpatient hospital care spanning beyond 2 midnights from the point of admission due to high intensity of service, high risk for further deterioration and high frequency of surveillance required.*   Family Communication: None  Disposition Plan: To be determined Status is: Inpatient   Dispo: The patient is from: Home              Anticipated d/c is to:               Anticipated d/c date is:               Patient currently not medically stable for discharge  Consultants: Interventional radiology  Procedures: None  Antimicrobials: None  DVT prophylaxis: Heparin   Objective: Vitals:   07/27/21 2000 07/27/21 2342 07/28/21 0000 07/28/21 0414  BP: 122/66  (!) 121/43 (!) 140/57  Pulse: 96  88 63  Resp: (!) 33  (!) 26 19  Temp:  98.2 F (36.8 C)  97.9 F (36.6 C)  TempSrc:  Axillary  Oral  SpO2: 95%  95% 93%  Weight:    110 kg  Height:        Intake/Output Summary (Last 24 hours) at 07/28/2021 0924 Last data filed at 07/28/2021 0500 Gross per 24 hour  Intake 593.44 ml  Output 1400 ml  Net -806.56 ml   Filed Weights   07/25/21 1500 07/28/21 0414  Weight: 110 kg 110 kg   Body mass index is 33.82 kg/m.  Exam:  General: 80 y.o. year-old male well developed well nourished in no acute distress.  Alert and oriented x3. Cardiovascular: Regular rate  and rhythm with no rubs or gallops.  No thyromegaly or JVD noted.   Respiratory: Clear to auscultation with no wheezes or rales. Good inspiratory effort. Abdomen: Soft nontender nondistended with normal bowel sounds x4 quadrants. Musculoskeletal: No lower extremity edema. 2/4 pulses in all 4 extremities. Skin: No ulcerative lesions noted or rashes, Psychiatry: Mood is appropriate for condition and setting Neurology:    Data Reviewed: CBC: Recent Labs  Lab 07/24/21 1800 07/25/21 0432 07/26/21 0322 07/27/21 0326  WBC 7.0 7.0 7.7 6.5  NEUTROABS 5.5  --  5.3 4.5  HGB 13.2 11.7* 11.5* 11.0*  HCT 39.7 35.2* 34.9* 33.0*  MCV 94.3 93.4 93.3 92.4  PLT 118* 107* 112* 735*   Basic Metabolic Panel: Recent Labs  Lab 07/24/21 1800 07/25/21 0432 07/26/21 0322 07/27/21 0326 07/28/21 0315  NA 135 134* 135 132* 131*  K 3.6 3.6 4.0 3.6 3.9  CL 93* 99 96* 96* 94*  CO2 32 30 32 32 33*  GLUCOSE 143* 119* 112* 100* 119*  BUN 12 12 13 15 14   CREATININE 0.89 0.78 0.84 0.75 0.80  CALCIUM 8.4* 7.6* 7.9* 7.7* 7.6*  MG  --  2.1 2.2 2.3 2.4  PHOS  --   --  3.4 3.7 3.4   GFR: Estimated Creatinine Clearance: 97.6 mL/min (by C-G formula based on SCr of 0.8 mg/dL). Liver Function Tests: Recent Labs  Lab 07/24/21 1800 07/25/21 0432 07/26/21 0322 07/27/21 0326 07/28/21 0315  AST 24 22 21 17 18   ALT 30 23 23 21 19   ALKPHOS 206* 184* 190* 180* 169*  BILITOT 1.5* 1.1 1.2 1.0 1.0  PROT 7.3 6.1* 6.0* 5.6* 5.8*  ALBUMIN 3.1* 2.5* 2.5* 2.4* 2.4*   No results for input(s): LIPASE, AMYLASE in the last 168 hours. No results for input(s): AMMONIA in the last 168 hours. Coagulation Profile: Recent Labs  Lab 07/24/21 2050  INR 1.2   Cardiac Enzymes: No results for input(s): CKTOTAL, CKMB, CKMBINDEX, TROPONINI in the last 168 hours. BNP (last 3 results) No results for input(s): PROBNP in the last 8760 hours. HbA1C: No results for input(s): HGBA1C in the last 72 hours. CBG: Recent Labs  Lab  07/27/21 0726 07/27/21 1206 07/27/21 1708 07/27/21 2134 07/28/21 0817  GLUCAP 119* 127* 121* 181* 150*   Lipid Profile: No results for input(s): CHOL, HDL, LDLCALC, TRIG, CHOLHDL, LDLDIRECT in the last 72 hours. Thyroid Function Tests: No results for input(s): TSH, T4TOTAL, FREET4, T3FREE, THYROIDAB in the last 72 hours. Anemia Panel: No results for input(s): VITAMINB12, FOLATE, FERRITIN, TIBC, IRON, RETICCTPCT in the last 72 hours. Urine analysis:    Component Value Date/Time   COLORURINE YELLOW 06/29/2021 2315   APPEARANCEUR CLEAR 06/29/2021 2315   LABSPEC 1.019 06/29/2021 2315   PHURINE 5.0 06/29/2021 2315   GLUCOSEU NEGATIVE 06/29/2021 2315   HGBUR MODERATE (A) 06/29/2021 2315   [place] 06/29/2021 2315  BILIRUBINUR Negative 03/25/2018 0902   KETONESUR 5 (A) 06/29/2021 2315   PROTEINUR NEGATIVE 06/29/2021 2315   UROBILINOGEN 0.2 03/25/2018 0902   NITRITE NEGATIVE 06/29/2021 2315   LEUKOCYTESUR NEGATIVE 06/29/2021 2315   Sepsis Labs: @LABRCNTIP (procalcitonin:4,lacticidven:4)  ) Recent Results (from the past 240 hour(s))  Resp Panel by RT-PCR (Flu A&B, Covid) Nasopharyngeal Swab     Status: None   Collection Time: 07/24/21  6:00 PM   Specimen: Nasopharyngeal Swab; Nasopharyngeal(NP) swabs in vial transport medium  Result Value Ref Range Status   SARS Coronavirus 2 by RT PCR NEGATIVE NEGATIVE Final    Comment: (NOTE) SARS-CoV-2 target nucleic acids are NOT DETECTED.  The SARS-CoV-2 RNA is generally detectable in upper respiratory specimens during the acute phase of infection. The lowest concentration of SARS-CoV-2 viral copies this assay can detect is 138 copies/mL. A negative result does not preclude SARS-Cov-2 infection and should not be used as the sole basis for treatment or other patient management decisions. A negative result may occur with  improper specimen collection/handling, submission of specimen other than nasopharyngeal swab, presence of  viral mutation(s) within the areas targeted by this assay, and inadequate number of viral copies(<138 copies/mL). A negative result must be combined with clinical observations, patient history, and epidemiological information. The expected result is Negative.  Fact Sheet for Patients:  EntrepreneurPulse.com.au  Fact Sheet for Healthcare Providers:  IncredibleEmployment.be  This test is no t yet approved or cleared by the [place] FDA and  has been authorized for detection and/or diagnosis of SARS-CoV-2 by FDA under an Emergency Use Authorization (EUA). This EUA will remain  in effect (meaning this test can be used) for the duration of the COVID-19 declaration under Section 564(b)(1) of the Act, 21 U.S.C.section 360bbb-3(b)(1), unless the authorization is terminated  or revoked sooner.       Influenza A by PCR NEGATIVE NEGATIVE Final   Influenza B by PCR NEGATIVE NEGATIVE Final    Comment: (NOTE) The Xpert Xpress SARS-CoV-2/FLU/RSV plus assay is intended as an aid in the diagnosis of influenza from Nasopharyngeal swab specimens and should not be used as a sole basis for treatment. Nasal washings and aspirates are unacceptable for Xpert Xpress SARS-CoV-2/FLU/RSV testing.  Fact Sheet for Patients: EntrepreneurPulse.com.au  Fact Sheet for Healthcare Providers: IncredibleEmployment.be  This test is not yet approved or cleared by the [place] FDA and has been authorized for detection and/or diagnosis of SARS-CoV-2 by FDA under an Emergency Use Authorization (EUA). This EUA will remain in effect (meaning this test can be used) for the duration of the COVID-19 declaration under Section 564(b)(1) of the Act, 21 U.S.C. section 360bbb-3(b)(1), unless the authorization is terminated or revoked.  Performed at [facility], [place] [address], [place], [place] [address]   Blood culture  (routine x 2)     Status: None (Preliminary result)   Collection Time: 07/24/21  6:00 PM   Specimen: BLOOD  Result Value Ref Range Status   Specimen Description   Final    BLOOD RIGHT ANTECUBITAL Performed at [facility] [address] [place], [place] [address]    Special Requests   Final    BOTTLES DRAWN AEROBIC AND ANAEROBIC Blood Culture adequate volume Performed at [facility] [address], [place], [place] [address]    Culture   Final    NO GROWTH 3 DAYS Performed at [facility], [place] [address] [place], [place] [address]    Report Status PENDING  Incomplete  Blood culture (routine x 2)     Status: None (Preliminary result)   Collection Time: 07/24/21  6:30 PM   Specimen: BLOOD  Result Value Ref Range Status   Specimen Description   Final    BLOOD LEFT ANTECUBITAL Performed at [facility] [address] [place], [place] [address]    Special Requests   Final    BOTTLES DRAWN AEROBIC AND ANAEROBIC Blood Culture adequate volume Performed at [facility] [address], [place], [place] [address]    Culture   Final    NO GROWTH 3 DAYS Performed at [facility], [place] [address] [place], [place] [address]    Report Status PENDING  Incomplete  MRSA Next Gen by PCR, Nasal     Status: None   Collection Time: 07/25/21  2:59 PM   Specimen: Nasal Mucosa; Nasal Swab  Result Value Ref Range Status   MRSA by PCR Next Gen NOT DETECTED NOT DETECTED Final    Comment: (NOTE) The GeneXpert MRSA Assay (FDA approved for NASAL specimens only), is one component of a comprehensive MRSA colonization surveillance program. It is not intended to diagnose MRSA infection nor to guide or monitor treatment for MRSA infections. Test performance is not FDA approved in patients less than 66 years old. Performed at [facility], [place] [address], [place], [place] [address]        Studies: No results found.  Scheduled Meds:  (feeding supplement) PROSource Plus  30 mL Oral Daily   buPROPion  150 mg Oral Daily   Chlorhexidine Gluconate Cloth  6 each Topical Daily   feeding supplement  237 mL Oral Q24H   insulin aspart  0-15 Units Subcutaneous TID WC   insulin aspart  0-5 Units Subcutaneous QHS   insulin aspart  3 Units Subcutaneous TID WC   insulin glargine-yfgn  25 Units Subcutaneous QHS   mouth rinse  15 mL Mouth Rinse BID   multivitamin with minerals  1 tablet Oral Daily   oxybutynin  10 mg Oral Daily   oxymetazoline  1 spray Each Nare BID   pantoprazole  40 mg Oral Daily   simvastatin  20 mg Oral q1800   sodium chloride flush  3 mL Intravenous Q12H   tamsulosin  0.4 mg Oral BID    Continuous Infusions:  heparin 1,850 Units/hr (07/28/21 0300)     LOS: 4 days     Cristal Deer, MD [facility]  To reach me or the doctor on call, go to: [email or web address] Password [email or web address]  07/28/2021, 9:24 AM

## 2021-07-28 NOTE — Plan of Care (Signed)

## 2021-07-28 NOTE — Progress Notes (Signed)
ANTICOAGULATION CONSULT NOTE - follow up  Pharmacy Consult for Heparin Indication: pulmonary embolus, DVT's in R leg  Allergies  Allergen Reactions   Lipitor [Atorvastatin Calcium] Other (See Comments)    Muscle soreness   Oxycodone Anxiety    Causes patient to become shaky and dizzy. Unable to tolerate.   Patient Measurements: Height: 5\' 11"  (180.3 cm) Weight: 110 kg (242 lb 8.1 oz) IBW/kg (Calculated) : 75.3 Heparin Dosing Weight:   Vital Signs: Temp: 97.9 F (36.6 C) (12/04 0414) Temp Source: Oral (12/04 0414) BP: 141/58 (12/04 1214) Pulse Rate: 96 (12/04 1214)  Labs: Recent Labs    07/26/21 0322 07/27/21 0326 07/27/21 1417 07/27/21 1629 07/28/21 0315  HGB 11.5* 11.0*  --   --   --   HCT 34.9* 33.0*  --   --   --   PLT 112* 111*  --   --   --   HEPARINUNFRC 0.36 0.37  --   --  0.59  CREATININE 0.84 0.75  --   --  0.80  TROPONINIHS  --   --  9 9  --     Estimated Creatinine Clearance: 97.6 mL/min (by C-G formula based on SCr of 0.8 mg/dL).  Medical History: Past Medical History:  Diagnosis Date   AI (aortic insufficiency) 01/11/2018   Trace, noted on ECHO   Arthritis    CAD in native artery    Nuclear stress test 10/19: EF 58, normal perfusion, low risk   Cellulitis    Dermatitis    Diabetes mellitus    type II   Diastolic dysfunction 62/95/2841   Mild, noted on ECHO   Diverticulosis 01/31/2016   ASCENDING COLON AND CECUM, NOTED ON COLONOSCOPY   DOE (dyspnea on exertion)    Edema    Fatty liver 09/18/2004   Noted on [place] ABD   History of kidney stones 05/23/2002   Noted on CT Abd   Hx of colonic polyps 01/31/2016   Hyperlipidemia    Hypertension    Internal hemorrhoids 01/31/2016   Noted on colonoscopy   Obesity    OSA (obstructive sleep apnea)    cpap   Positional vertigo    Pulmonary hypertension ([place]) 01/11/2018   Mild, noted on ECHO   Rhinosinusitis    Skin lesion    TR (tricuspid regurgitation) 01/11/2018   Trace, noted on ECHO     Medications:  Infusions:   Assessment: 61 yoM with PMH of recent Covid pneumonia (admitted 11/6-11/8), OSA, CAD, DM, HTN presents to ED with SOB.  CT angio with pulmonary embolus.  Preliminary lower extremity dopplers with acute DVT in R mid and distal femoral vein, R popliteal vein, R posterior tibial veins, and R peroneal veins. Pharmacy is consulted to dose heparin for PE.   No prior anticoagulation Baseline coags: aPTT 27 seconds, INR 1.2 Baseline CBC:  Hgb 13.2, Plt 118 (chronic thrombocytopenia)  Heparin level remains therapeutic on current IV heparin rate of 1850 units/hr No reported bleeding  Goal of Therapy:  Heparin level 0.3-0.7 units/ml Monitor platelets by anticoagulation protocol: Yes   Plan:  Continue heparin infusion at 1850 units/hr Daily heparin level and CBC Monitor for signs of bleeding F/u ability to transition to PO anticoagulation after liver biopsy 12/5, stop Heparin at [place], PharmD, BCPS Secure Chat if ?s 07/28/2021 1:52 PM

## 2021-07-29 ENCOUNTER — Inpatient Hospital Stay (HOSPITAL_COMMUNITY): Payer: Medicare Other

## 2021-07-29 DIAGNOSIS — J9621 Acute and chronic respiratory failure with hypoxia: Secondary | ICD-10-CM

## 2021-07-29 DIAGNOSIS — I2510 Atherosclerotic heart disease of native coronary artery without angina pectoris: Secondary | ICD-10-CM | POA: Diagnosis not present

## 2021-07-29 DIAGNOSIS — I2699 Other pulmonary embolism without acute cor pulmonale: Secondary | ICD-10-CM | POA: Diagnosis not present

## 2021-07-29 DIAGNOSIS — I10 Essential (primary) hypertension: Secondary | ICD-10-CM

## 2021-07-29 DIAGNOSIS — G4733 Obstructive sleep apnea (adult) (pediatric): Secondary | ICD-10-CM

## 2021-07-29 DIAGNOSIS — E1169 Type 2 diabetes mellitus with other specified complication: Secondary | ICD-10-CM | POA: Diagnosis not present

## 2021-07-29 DIAGNOSIS — K769 Liver disease, unspecified: Secondary | ICD-10-CM

## 2021-07-29 LAB — CBC
HCT: 33.4 % — ABNORMAL LOW (ref 39.0–52.0)
Hemoglobin: 11.2 g/dL — ABNORMAL LOW (ref 13.0–17.0)
MCH: 30.9 pg (ref 26.0–34.0)
MCHC: 33.5 g/dL (ref 30.0–36.0)
MCV: 92.3 fL (ref 80.0–100.0)
Platelets: 116 10*3/uL — ABNORMAL LOW (ref 150–400)
RBC: 3.62 MIL/uL — ABNORMAL LOW (ref 4.22–5.81)
RDW: 13.7 % (ref 11.5–15.5)
WBC: 7 10*3/uL (ref 4.0–10.5)
nRBC: 0 % (ref 0.0–0.2)

## 2021-07-29 LAB — GLUCOSE, CAPILLARY
Glucose-Capillary: 110 mg/dL — ABNORMAL HIGH (ref 70–99)
Glucose-Capillary: 131 mg/dL — ABNORMAL HIGH (ref 70–99)
Glucose-Capillary: 168 mg/dL — ABNORMAL HIGH (ref 70–99)
Glucose-Capillary: 281 mg/dL — ABNORMAL HIGH (ref 70–99)

## 2021-07-29 LAB — BRAIN NATRIURETIC PEPTIDE: B Natriuretic Peptide: 54.3 pg/mL (ref 0.0–100.0)

## 2021-07-29 LAB — PROCALCITONIN: Procalcitonin: 0.21 ng/mL

## 2021-07-29 LAB — HEPARIN LEVEL (UNFRACTIONATED): Heparin Unfractionated: 0.95 IU/mL — ABNORMAL HIGH (ref 0.30–0.70)

## 2021-07-29 MED ORDER — IPRATROPIUM-ALBUTEROL 0.5-2.5 (3) MG/3ML IN SOLN
3.0000 mL | RESPIRATORY_TRACT | Status: DC | PRN
  Administered 2021-08-03 – 2021-08-10 (×2): 3 mL via RESPIRATORY_TRACT
  Filled 2021-07-29 (×2): qty 3

## 2021-07-29 MED ORDER — FUROSEMIDE 10 MG/ML IJ SOLN
20.0000 mg | Freq: Once | INTRAMUSCULAR | Status: AC
  Administered 2021-07-29: 20 mg via INTRAVENOUS
  Filled 2021-07-29: qty 2

## 2021-07-29 MED ORDER — BUDESONIDE 0.5 MG/2ML IN SUSP
0.5000 mg | Freq: Two times a day (BID) | RESPIRATORY_TRACT | Status: DC
  Administered 2021-07-29 – 2021-08-25 (×55): 0.5 mg via RESPIRATORY_TRACT
  Filled 2021-07-29 (×55): qty 2

## 2021-07-29 MED ORDER — LIDOCAINE HCL 1 % IJ SOLN
INTRAMUSCULAR | Status: AC
  Filled 2021-07-29: qty 20

## 2021-07-29 MED ORDER — METHYLPREDNISOLONE SODIUM SUCC 40 MG IJ SOLR
40.0000 mg | Freq: Every day | INTRAMUSCULAR | Status: DC
  Administered 2021-07-30 – 2021-08-02 (×4): 40 mg via INTRAVENOUS
  Filled 2021-07-29 (×4): qty 1

## 2021-07-29 MED ORDER — GELATIN ABSORBABLE 12-7 MM EX MISC
CUTANEOUS | Status: AC
  Filled 2021-07-29: qty 1

## 2021-07-29 MED ORDER — IPRATROPIUM-ALBUTEROL 0.5-2.5 (3) MG/3ML IN SOLN
3.0000 mL | Freq: Four times a day (QID) | RESPIRATORY_TRACT | Status: DC
  Administered 2021-07-29 (×3): 3 mL via RESPIRATORY_TRACT
  Filled 2021-07-29 (×3): qty 3

## 2021-07-29 MED ORDER — METHYLPREDNISOLONE SODIUM SUCC 125 MG IJ SOLR
81.2500 mg | Freq: Every day | INTRAMUSCULAR | Status: DC
  Administered 2021-07-29: 81.25 mg via INTRAVENOUS
  Filled 2021-07-29: qty 2

## 2021-07-29 MED ORDER — FENTANYL CITRATE (PF) 100 MCG/2ML IJ SOLN
INTRAMUSCULAR | Status: AC
  Filled 2021-07-29: qty 2

## 2021-07-29 MED ORDER — HEPARIN (PORCINE) 25000 UT/250ML-% IV SOLN
1700.0000 [IU]/h | INTRAVENOUS | Status: DC
  Administered 2021-07-29 – 2021-07-30 (×3): 1700 [IU]/h via INTRAVENOUS
  Filled 2021-07-29 (×3): qty 250

## 2021-07-29 MED ORDER — MIDAZOLAM HCL 2 MG/2ML IJ SOLN
INTRAMUSCULAR | Status: AC
  Filled 2021-07-29: qty 2

## 2021-07-29 NOTE — Progress Notes (Signed)
PROGRESS NOTE    Jacob Bishop  GUR:[id number] DOB: 06/03/1941 DOA: 07/24/2021 PCP: [name], NP   Brief Narrative:  80 year old with chronic hypoxic respiratory failure with COVID-19 infection, CAD, OSA, DM2, HTN, thrombocytopenia, liver and lung lesion concerning for metastatic disease admitted for shortness of breath found to have acute PE.  Due to metastatic lesion, IR consulted for biopsy.   Assessment & Plan:   Principal Problem:   Acute pulmonary embolus (HCC) Active Problems:   Obstructive sleep apnea   Essential hypertension   CAD, NATIVE VESSEL   Thrombocytopenia (HCC)   Mass of lung   Lesion of liver   Acute on chronic respiratory failure with hypoxia (HCC)   DM type 2 with diabetic mixed hyperlipidemia (HCC)   Depression   Pressure injury of skin   Chest pain  Acute respiratory distress with hypoxia - Combination of pulmonary embolism and bilateral abnormal breath sounds  Acute pulmonary embolism, subsegmental - Currently on heparin drip until liver lesion biopsy.  Eventually will be on NOAC - Echocardiogram EF normal. - Lower extremity ultrasound showed right lower extremity DVT  Bilateral diffuse abnormal breath sounds - Bronchodilators scheduled and as needed, I-S/flutter.  BNP 54.3, procalcitonin 0.2.  Out of bed to chair.  COVID-19 infection - 06/29/21. Now out of isolation. Repeat test here is negative. Procal 0.21  Metastatic lesions in the lung and liver - Unknown primary. - IR consulted for biopsy today.  Diabetes mellitus type 2 - On Semglee 25 units at bedtime.  Sliding scale and Accu-Cheks  Thrombocytopenia -No evidence of bleeding.  Continue to monitor  Depression - Wellbutrin  Hyperlipidemia - Zocor  Obstructive sleep apnea -Bedtime CPAP as needed  I have discussed risk and benefits of NOAC and Coumadin therapy.  Patient and family prefers NOAC eventually  DVT prophylaxis: Currently on heparin drip Code Status: Full  code Family Communication:  Wife at bedside.   Status is: Inpatient  Remains inpatient appropriate because: Still has significant amount of abnormal breath sounds.  He is not safe for discharge.       Nutritional status  Nutrition Problem: Increased nutrient needs Etiology: acute illness, chronic illness  Signs/Symptoms: estimated needs  Interventions: Ensure Enlive (each supplement provides 350kcal and 20 grams of protein), Prostat, MVI  Body mass index is 33.82 kg/m.  Pressure Injury 07/25/21 Buttocks Right Stage 2 -  Partial thickness loss of dermis presenting as a shallow open injury with a red, pink wound bed without slough. (Active)  07/25/21 1530  Location: Buttocks  Location Orientation: Right  Staging: Stage 2 -  Partial thickness loss of dermis presenting as a shallow open injury with a red, pink wound bed without slough.  Wound Description (Comments):   Present on Admission: Yes     Pressure Injury 07/25/21 Buttocks Left Stage 2 -  Partial thickness loss of dermis presenting as a shallow open injury with a red, pink wound bed without slough. (Active)  07/25/21 1531  Location: Buttocks  Location Orientation: Left  Staging: Stage 2 -  Partial thickness loss of dermis presenting as a shallow open injury with a red, pink wound bed without slough.  Wound Description (Comments):   Present on Admission: Yes          Subjective: Overall patient still has dyspnea with minimal exertion with abnormal significant breath sounds.  He is coughing up quite a bit which is mostly clear.  Remains afebrile.  Review of Systems Otherwise negative except as per HPI, including:  General: Denies fever, chills, night sweats or unintended weight loss. Resp: Denies hemoptysis Cardiac: Denies chest pain, palpitations, orthopnea, paroxysmal nocturnal dyspnea. GI: Denies abdominal pain, nausea, vomiting, diarrhea or constipation GU: Denies dysuria, frequency, hesitancy or  incontinence MS: Denies muscle aches, joint pain or swelling Neuro: Denies headache, neurologic deficits (focal weakness, numbness, tingling), abnormal gait Psych: Denies anxiety, depression, SI/HI/AVH Skin: Denies new rashes or lesions ID: Denies sick contacts, exotic exposures, travel  Examination:  General exam: Appears calm and comfortable, 10 L nasal cannula Respiratory system: Diffuse bilateral rhonchi Cardiovascular system: S1 & S2 heard, RRR. No JVD, murmurs, rubs, gallops or clicks. No pedal edema. Gastrointestinal system: Abdomen is nondistended, soft and nontender. No organomegaly or masses felt. Normal bowel sounds heard. Central nervous system: Alert and oriented. No focal neurological deficits. Extremities: Symmetric 5 x 5 power. Skin: No rashes, lesions or ulcers Psychiatry: Judgement and insight appear normal. Mood & affect appropriate.     Objective: Vitals:   07/28/21 2004 07/28/21 2004 07/28/21 2229 07/29/21 0527  BP:  (!) 143/61  127/62  Pulse:  77  74  Resp:  (!) 22 (!) 42 (!) 21  Temp:  98.1 F (36.7 C)  97.6 F (36.4 C)  TempSrc:  Oral  Oral  SpO2: 90% 92%  91%  Weight:      Height:        Intake/Output Summary (Last 24 hours) at 07/29/2021 0902 Last data filed at 07/29/2021 0700 Gross per 24 hour  Intake 505.71 ml  Output 300 ml  Net 205.71 ml   Filed Weights   07/25/21 1500 07/28/21 0414  Weight: 110 kg 110 kg     Data Reviewed:   CBC: Recent Labs  Lab 07/24/21 1800 07/25/21 0432 07/26/21 0322 07/27/21 0326 07/29/21 0409  WBC 7.0 7.0 7.7 6.5 7.0  NEUTROABS 5.5  --  5.3 4.5  --   HGB 13.2 11.7* 11.5* 11.0* 11.2*  HCT 39.7 35.2* 34.9* 33.0* 33.4*  MCV 94.3 93.4 93.3 92.4 92.3  PLT 118* 107* 112* 111* 102*   Basic Metabolic Panel: Recent Labs  Lab 07/24/21 1800 07/25/21 0432 07/26/21 0322 07/27/21 0326 07/28/21 0315  NA 135 134* 135 132* 131*  K 3.6 3.6 4.0 3.6 3.9  CL 93* 99 96* 96* 94*  CO2 32 30 32 32 33*  GLUCOSE  143* 119* 112* 100* 119*  BUN 12 12 13 15 14   CREATININE 0.89 0.78 0.84 0.75 0.80  CALCIUM 8.4* 7.6* 7.9* 7.7* 7.6*  MG  --  2.1 2.2 2.3 2.4  PHOS  --   --  3.4 3.7 3.4   GFR: Estimated Creatinine Clearance: 97.6 mL/min (by C-G formula based on SCr of 0.8 mg/dL). Liver Function Tests: Recent Labs  Lab 07/24/21 1800 07/25/21 0432 07/26/21 0322 07/27/21 0326 07/28/21 0315  AST 24 22 21 17 18   ALT 30 23 23 21 19   ALKPHOS 206* 184* 190* 180* 169*  BILITOT 1.5* 1.1 1.2 1.0 1.0  PROT 7.3 6.1* 6.0* 5.6* 5.8*  ALBUMIN 3.1* 2.5* 2.5* 2.4* 2.4*   No results for input(s): LIPASE, AMYLASE in the last 168 hours. No results for input(s): AMMONIA in the last 168 hours. Coagulation Profile: Recent Labs  Lab 07/24/21 2050  INR 1.2   Cardiac Enzymes: No results for input(s): CKTOTAL, CKMB, CKMBINDEX, TROPONINI in the last 168 hours. BNP (last 3 results) No results for input(s): PROBNP in the last 8760 hours. HbA1C: No results for input(s): HGBA1C in the last 72 hours.  CBG: Recent Labs  Lab 07/28/21 0817 07/28/21 1203 07/28/21 1603 07/28/21 2000 07/29/21 0800  GLUCAP 150* 141* 138* 104* 110*   Lipid Profile: No results for input(s): CHOL, HDL, LDLCALC, TRIG, CHOLHDL, LDLDIRECT in the last 72 hours. Thyroid Function Tests: No results for input(s): TSH, T4TOTAL, FREET4, T3FREE, THYROIDAB in the last 72 hours. Anemia Panel: No results for input(s): VITAMINB12, FOLATE, FERRITIN, TIBC, IRON, RETICCTPCT in the last 72 hours. Sepsis Labs: Recent Labs  Lab 07/24/21 1800  LATICACIDVEN 1.4    Recent Results (from the past 240 hour(s))  Resp Panel by RT-PCR (Flu A&B, Covid) Nasopharyngeal Swab     Status: None   Collection Time: 07/24/21  6:00 PM   Specimen: Nasopharyngeal Swab; Nasopharyngeal(NP) swabs in vial transport medium  Result Value Ref Range Status   SARS Coronavirus 2 by RT PCR NEGATIVE NEGATIVE Final    Comment: (NOTE) SARS-CoV-2 target nucleic acids are NOT  DETECTED.  The SARS-CoV-2 RNA is generally detectable in upper respiratory specimens during the acute phase of infection. The lowest concentration of SARS-CoV-2 viral copies this assay can detect is 138 copies/mL. A negative result does not preclude SARS-Cov-2 infection and should not be used as the sole basis for treatment or other patient management decisions. A negative result may occur with  improper specimen collection/handling, submission of specimen other than nasopharyngeal swab, presence of viral mutation(s) within the areas targeted by this assay, and inadequate number of viral copies(<138 copies/mL). A negative result must be combined with clinical observations, patient history, and epidemiological information. The expected result is Negative.  Fact Sheet for Patients:  EntrepreneurPulse.com.au  Fact Sheet for Healthcare Providers:  IncredibleEmployment.be  This test is no t yet approved or cleared by the [place] FDA and  has been authorized for detection and/or diagnosis of SARS-CoV-2 by FDA under an Emergency Use Authorization (EUA). This EUA will remain  in effect (meaning this test can be used) for the duration of the COVID-19 declaration under Section 564(b)(1) of the Act, 21 U.S.C.section 360bbb-3(b)(1), unless the authorization is terminated  or revoked sooner.       Influenza A by PCR NEGATIVE NEGATIVE Final   Influenza B by PCR NEGATIVE NEGATIVE Final    Comment: (NOTE) The Xpert Xpress SARS-CoV-2/FLU/RSV plus assay is intended as an aid in the diagnosis of influenza from Nasopharyngeal swab specimens and should not be used as a sole basis for treatment. Nasal washings and aspirates are unacceptable for Xpert Xpress SARS-CoV-2/FLU/RSV testing.  Fact Sheet for Patients: EntrepreneurPulse.com.au  Fact Sheet for Healthcare Providers: IncredibleEmployment.be  This test is not yet  approved or cleared by the [place] FDA and has been authorized for detection and/or diagnosis of SARS-CoV-2 by FDA under an Emergency Use Authorization (EUA). This EUA will remain in effect (meaning this test can be used) for the duration of the COVID-19 declaration under Section 564(b)(1) of the Act, 21 U.S.C. section 360bbb-3(b)(1), unless the authorization is terminated or revoked.  Performed at [facility], [place] [address], [place], [place] [address]   Blood culture (routine x 2)     Status: None (Preliminary result)   Collection Time: 07/24/21  6:00 PM   Specimen: BLOOD  Result Value Ref Range Status   Specimen Description   Final    BLOOD RIGHT ANTECUBITAL Performed at [facility] [address] [place], [place] [address]    Special Requests   Final    BOTTLES DRAWN AEROBIC AND ANAEROBIC Blood Culture adequate  volume Performed at [facility], [place] [address], [place], [place] [address]    Culture   Final    NO GROWTH 4 DAYS Performed at [facility], [place] [address] [place], [place], [place] [address]    Report Status PENDING  Incomplete  Blood culture (routine x 2)     Status: None (Preliminary result)   Collection Time: 07/24/21  6:30 PM   Specimen: BLOOD  Result Value Ref Range Status   Specimen Description   Final    BLOOD LEFT ANTECUBITAL Performed at [facility] [address], [place], [place] [address]    Special Requests   Final    BOTTLES DRAWN AEROBIC AND ANAEROBIC Blood Culture adequate volume Performed at [facility] [address], [place], [place] [address]    Culture   Final    NO GROWTH 4 DAYS Performed at [facility], [place] [address], [place], [place] [address]    Report Status PENDING  Incomplete  MRSA Next Gen by PCR, Nasal     Status: None   Collection Time: 07/25/21  2:59 PM   Specimen: Nasal Mucosa; Nasal Swab  Result Value Ref  Range Status   MRSA by PCR Next Gen NOT DETECTED NOT DETECTED Final    Comment: (NOTE) The GeneXpert MRSA Assay (FDA approved for NASAL specimens only), is one component of a comprehensive MRSA colonization surveillance program. It is not intended to diagnose MRSA infection nor to guide or monitor treatment for MRSA infections. Test performance is not FDA approved in patients less than 81 years old. Performed at [facility], [place] [address], [place],  [address]          Radiology Studies: No results found.      Scheduled Meds:  (feeding supplement) PROSource Plus  30 mL Oral Daily   buPROPion  150 mg Oral Daily   Chlorhexidine Gluconate Cloth  6 each Topical Daily   feeding supplement  237 mL Oral Q24H   insulin aspart  0-15 Units Subcutaneous TID WC   insulin aspart  0-5 Units Subcutaneous QHS   insulin aspart  3 Units Subcutaneous TID WC   insulin glargine-yfgn  25 Units Subcutaneous QHS   mouth rinse  15 mL Mouth Rinse BID   multivitamin with minerals  1 tablet Oral Daily   oxybutynin  10 mg Oral Daily   oxymetazoline  1 spray Each Nare BID   pantoprazole  40 mg Oral Daily   simvastatin  20 mg Oral q1800   sodium chloride flush  3 mL Intravenous Q12H   tamsulosin  0.4 mg Oral BID   Continuous Infusions:   LOS: 5 days   Time spent= 35 mins    Tenesia Escudero Arsenio Loader, MD [facility]  If 7PM-7AM, please contact night-coverage  07/29/2021, 9:02 AM

## 2021-07-29 NOTE — Consult Note (Signed)
NAME:  Jacob Bishop, MRN:  [id number], DOB:  Mar 19, 1941, LOS: 5 ADMISSION DATE:  07/24/2021, CONSULTATION DATE:  07/29/21 REFERRING MD:  Reesa Chew, CHIEF COMPLAINT:  pulmonary nodules  History of Present Illness:  Jacob Bishop is a 80 y.o. M with PMH significant for CAD, IDDM, OSA not compliant with CPAP and recent admission (06/2021) for Covid-19 PNA and found to have numerous pulmonary nodules concerning for metastasis along with 4.7cm liver lesion.   Jacob Bishop was discharged home on nasal cannula O2 and followed up with [facility].  Jacob Bishop was scheduled for a PET scan, but in the interim developed worsening shortness of breath and presented to the ED.  Repeat CTA chest showed RUL segmental PE without evidence of RH strain and numerous bilateral lung masses and several new nodules.  Jacob Bishop was started on heparin gtt, lower extremities duplex with acute RLE DVT.    Pt was evaluated by IR for possible liver lesion biopsy, however lesion noted to be very hyperechoic and likely representing an hemangioma.  PCCM consulted for possible pulmonary nodule biopsy   Pertinent  Medical History   has a past medical history of AI (aortic insufficiency) (01/11/2018), Arthritis, CAD in native artery, Cellulitis, Dermatitis, Diabetes mellitus, Diastolic dysfunction (37/05/6268), Diverticulosis (01/31/2016), DOE (dyspnea on exertion), Edema, Fatty liver (09/18/2004), History of kidney stones (05/23/2002), colonic polyps (01/31/2016), Hyperlipidemia, Hypertension, Internal hemorrhoids (01/31/2016), Obesity, OSA (obstructive sleep apnea), Positional vertigo, Pulmonary hypertension ([place]) (01/11/2018), Rhinosinusitis, Skin lesion, and TR (tricuspid regurgitation) (01/11/2018).,h  Significant Hospital Events: Including procedures, antibiotic start and stop dates in addition to other pertinent events   11/30 presented with dyspnea, admit to hospitalists, found to have segmental, acute PE  Interim History / Subjective:  Pt sleeping but  arousable, states that Jacob Bishop still feels short of breath and no better since admission, on 10L [place]  Objective   Blood pressure 127/62, pulse 74, temperature 97.6 F (36.4 C), temperature source Oral, resp. rate (!) 21, height 5\' 11"  (1.803 m), weight 110 kg, SpO2 90 %.        Intake/Output Summary (Last 24 hours) at 07/29/2021 1525 Last data filed at 07/29/2021 0700 Gross per 24 hour  Intake 505.71 ml  Output 300 ml  Net 205.71 ml   Filed Weights   07/25/21 1500 07/28/21 0414  Weight: 110 kg 110 kg    General:  fatigued-appearing elderly M resting in bed in no acute distress HEENT: MM pink/moist, PERRLA Neuro: somnolent, but awakens to voice and briefly answers questions before falling back asleep CV: s1s2 rrr, no m/r/g PULM:  mildly decreased air entry in the bilateral bases without rhonchi or wheezing.  There are no retractions or accessory muscle use GI: soft, bsx4 active  Extremities: warm/dry, no edema  Skin: no rashes or lesions   Resolved Hospital Problem list     Assessment & Plan:   Acute on chronic Hypoxic Respiratory Failure  Acute Pulmonary Embolus and RLE DVT RUL segmental PE without evidence of RH strain on CTA or echo LVEF 60-65% RV size and systolic function WNL on Echo -heparin gtt and eventual DOAC -continue supplemental O2 to maintain O2 sats >92%   Hepatic lesion and Pulmonary Nodules  IR evaluated for possible liver lesion biopsy, thought to be likely hemangioma -pt would benefit from bronch and biopsy, however currently on 10L and somnolent, likely too high risk currently for bronchoscopy without intubation -established outpatient with  Pulmonology and PET scan pending -PCCM will continue to follow with you  Best Practice (right click and "Reselect all SmartList Selections" daily)   Per primary  Labs   CBC: Recent Labs  Lab 07/24/21 1800 07/25/21 0432 07/26/21 0322 07/27/21 0326 07/29/21 0409  WBC 7.0 7.0 7.7 6.5 7.0   NEUTROABS 5.5  --  5.3 4.5  --   HGB 13.2 11.7* 11.5* 11.0* 11.2*  HCT 39.7 35.2* 34.9* 33.0* 33.4*  MCV 94.3 93.4 93.3 92.4 92.3  PLT 118* 107* 112* 111* 116*    Basic Metabolic Panel: Recent Labs  Lab 07/24/21 1800 07/25/21 0432 07/26/21 0322 07/27/21 0326 07/28/21 0315  NA 135 134* 135 132* 131*  K 3.6 3.6 4.0 3.6 3.9  CL 93* 99 96* 96* 94*  CO2 32 30 32 32 33*  GLUCOSE 143* 119* 112* 100* 119*  BUN 12 12 13 15 14   CREATININE 0.89 0.78 0.84 0.75 0.80  CALCIUM 8.4* 7.6* 7.9* 7.7* 7.6*  MG  --  2.1 2.2 2.3 2.4  PHOS  --   --  3.4 3.7 3.4   GFR: Estimated Creatinine Clearance: 97.6 mL/min (by C-G formula based on SCr of 0.8 mg/dL). Recent Labs  Lab 07/24/21 1800 07/25/21 0432 07/26/21 0322 07/27/21 0326 07/29/21 0409  PROCALCITON  --   --   --   --  0.21  WBC 7.0 7.0 7.7 6.5 7.0  LATICACIDVEN 1.4  --   --   --   --     Liver Function Tests: Recent Labs  Lab 07/24/21 1800 07/25/21 0432 07/26/21 0322 07/27/21 0326 07/28/21 0315  AST 24 22 21 17 18   ALT 30 23 23 21 19   ALKPHOS 206* 184* 190* 180* 169*  BILITOT 1.5* 1.1 1.2 1.0 1.0  PROT 7.3 6.1* 6.0* 5.6* 5.8*  ALBUMIN 3.1* 2.5* 2.5* 2.4* 2.4*   No results for input(s): LIPASE, AMYLASE in the last 168 hours. No results for input(s): AMMONIA in the last 168 hours.  ABG    Component Value Date/Time   HCO3 33.5 (H) 07/24/2021 1800   O2SAT 31.8 07/24/2021 1800     Coagulation Profile: Recent Labs  Lab 07/24/21 2050  INR 1.2    Cardiac Enzymes: No results for input(s): CKTOTAL, CKMB, CKMBINDEX, TROPONINI in the last 168 hours.  HbA1C: HbA1c, POC (controlled diabetic range)  Date/Time Value Ref Range Status  07/13/2018 03:19 PM 6.0 0.0 - 7.0 % Final   Hgb A1c MFr Bld  Date/Time Value Ref Range Status  07/01/2021 04:42 AM 6.2 (H) 4.8 - 5.6 % Final    Comment:    (NOTE) Pre diabetes:          5.7%-6.4%  Diabetes:              >6.4%  Glycemic control for   <7.0% adults with diabetes    06/30/2021 02:50 AM 6.0 (H) 4.8 - 5.6 % Final    Comment:    (NOTE) Pre diabetes:          5.7%-6.4%  Diabetes:              >6.4%  Glycemic control for   <7.0% adults with diabetes     CBG: Recent Labs  Lab 07/28/21 1203 07/28/21 1603 07/28/21 2000 07/29/21 0800 07/29/21 1112  GLUCAP 141* 138* 104* 110* 131*    Review of Systems:   Unable to obtain secondary to somnolence  Past Medical History:  Jacob Bishop,  has a past medical history of AI (aortic insufficiency) (01/11/2018), Arthritis, CAD in native artery, Cellulitis, Dermatitis, Diabetes mellitus,  Diastolic dysfunction (02/54/2706), Diverticulosis (01/31/2016), DOE (dyspnea on exertion), Edema, Fatty liver (09/18/2004), History of kidney stones (05/23/2002), colonic polyps (01/31/2016), Hyperlipidemia, Hypertension, Internal hemorrhoids (01/31/2016), Obesity, OSA (obstructive sleep apnea), Positional vertigo, Pulmonary hypertension ([place]) (01/11/2018), Rhinosinusitis, Skin lesion, and TR (tricuspid regurgitation) (01/11/2018).   Surgical History:   Past Surgical History:  Procedure Laterality Date   CARDIAC CATHETERIZATION     COLONOSCOPY     CORONARY STENT PLACEMENT     3 stents /1997   POLYPECTOMY     TONSILLECTOMY     TOTAL KNEE ARTHROPLASTY Right 10/04/2018   Procedure: RIGHT TOTAL KNEE ARTHROPLASTY;  Surgeon: Gaynelle Arabian, MD;  Location: [facility];  Service: Orthopedics;  Laterality: Right;  Adductor Block     Social History:   reports that Jacob Bishop quit smoking about 25 years ago. His smoking use included cigarettes. Jacob Bishop has a 20.00 pack-year smoking history. Jacob Bishop has never used smokeless tobacco. Jacob Bishop reports that Jacob Bishop does not drink alcohol and does not use drugs.   Family History:  His family history includes Cancer in his mother; Diabetes in his sister; Heart attack in his brother, father, and sister; Heart disease in his father and sister.   Allergies Allergies  Allergen Reactions   Lipitor [Atorvastatin Calcium] Other  (See Comments)    Muscle soreness   Oxycodone Anxiety    Causes patient to become shaky and dizzy. Unable to tolerate.     Home Medications  Prior to Admission medications   Medication Sig Start Date End Date Taking? Authorizing Provider  albuterol (VENTOLIN HFA) 108 (90 Base) MCG/ACT inhaler TAKE 2 PUFFS BY MOUTH EVERY 6 HOURS AS NEEDED FOR WHEEZE OR SHORTNESS OF BREATH Patient taking differently: 2 puffs every 6 (six) hours as needed for shortness of breath. 07/11/21  Yes Nafziger, Tommi Rumps, NP  buPROPion (WELLBUTRIN XL) 150 MG 24 hr tablet Take 1 tablet (150 mg total) by mouth daily. 06/14/21  Yes Nafziger, Tommi Rumps, NP  Insulin Glargine (BASAGLAR KWIKPEN) 100 UNIT/ML INJECT 0.4 MLS (40 UNITS TOTAL) INTO THE SKIN AT BEDTIME. Patient taking differently: Inject 40 Units into the skin at bedtime. INJECT 0.4 MLS (40 UNITS TOTAL) INTO THE SKIN AT BEDTIME. 09/26/20  Yes Nafziger, Tommi Rumps, NP  meclizine (ANTIVERT) 12.5 MG tablet TAKE ONE TABLET BY MOUTH three times daily AS NEEDED FOR dizziness Patient taking differently: Take 12.5 mg by mouth 3 (three) times daily as needed. 06/18/21  Yes Nafziger, Tommi Rumps, NP  oxybutynin (DITROPAN-XL) 10 MG 24 hr tablet Take 10 mg by mouth daily. 02/29/20  Yes Ceasar Mons, MD  simvastatin (ZOCOR) 20 MG tablet Take 1 tablet (20 mg total) by mouth daily at 6 PM. Patient taking differently: Take 20 mg by mouth daily. 11/30/20  Yes Nafziger, Tommi Rumps, NP  tamsulosin (FLOMAX) 0.4 MG CAPS capsule Take 0.4 mg by mouth 2 (two) times daily. 03/07/15  Yes Ceasar Mons, MD  Insulin Pen Needle (BD PEN NEEDLE NANO U/F) 32G X 4 MM MISC USE AS DIRECTED TWICE A DAY 05/18/20   [name], NP  Misc. Devices (CAREX COCCYX CUSHION) MISC Use when sitting in chair or couch 07/16/21   [name], NP     Critical care time:  n/a      Otilio Carpen Tranesha Lessner, PA-C [facility] See Amion for pager If no response to pager , please call [phone] until  7pm After 7:00 pm call Elink  [phone]

## 2021-07-29 NOTE — NC FL2 (Signed)
[place] LEVEL OF CARE SCREENING TOOL     IDENTIFICATION  Patient Name: Jacob Bishop Birthdate: Feb 12, 1941 Sex: male Admission Date (Current Location): 07/24/2021  [facility] and [place] Number:  Herbalist and Address:  [facility],  [place], [place]      Provider Number: [id number]  Attending Physician Name and Address:  Damita Lack, MD  Relative Name and Phone Number:  Miron, [place] [phone]    Current Level of Care: Hospital Recommended Level of Care: [place] Prior Approval Number:    Date Approved/Denied:   PASRR Number: [id number]  Discharge Plan: SNF    Current Diagnoses: Patient Active Problem List   Diagnosis Date Noted   Chest pain 07/27/2021   Depression 07/25/2021   Pressure injury of skin 07/25/2021   Acute pulmonary embolus ([place]) 07/24/2021   Acute on chronic respiratory failure with hypoxia ([place]) 06/30/2021   DM type 2 with diabetic mixed hyperlipidemia ([place]) 06/30/2021   Mass of lung 06/27/2021   Lesion of liver 06/27/2021   Chronic respiratory failure with hypoxia ([place]) 06/27/2021   OA (osteoarthritis) of knee 10/04/2018   Other secondary pulmonary hypertension ([place]) 04/06/2018   Osteoarthritis, hand 05/05/2014   Carpal tunnel syndrome 05/02/2014   Eczema 01/23/2014   Thrombocytopenia ([place]) 11/19/2012   Overactive bladder 08/24/2012   Osteoarthritis of right knee 01/22/2012   Benign positional vertigo 07/09/2011   CAD, NATIVE VESSEL 10/22/2009   EDEMA 08/03/2009   Obstructive sleep apnea 05/03/2009   SKIN LESION 09/23/2007   Diabetes mellitus type 2, uncontrolled 03/23/2007   Hyperlipidemia 03/23/2007   Essential hypertension 03/23/2007   COLONIC POLYPS, HX OF 03/23/2007   Morbid obesity ([place]) 03/23/2007    Orientation RESPIRATION BLADDER Height & Weight     Self, Time, Situation, Place  O2 (O2 [place]) External catheter Weight: 110 kg Height:  5\' 11"   (180.3 cm)  BEHAVIORAL SYMPTOMS/MOOD NEUROLOGICAL BOWEL NUTRITION STATUS      Continent Diet (Carb Modified)  AMBULATORY STATUS COMMUNICATION OF NEEDS Skin   Extensive Assist Verbally Other (Comment) (Dry Flaky skin Ecchymosis Bilateral Arms)                       Personal Care Assistance Level of Assistance  Bathing, Feeding, Dressing Bathing Assistance: Maximum assistance Feeding assistance: Limited assistance Dressing Assistance: Maximum assistance     Functional Limitations Info  Sight, Hearing, Speech Sight Info: Impaired Hearing Info: Adequate Speech Info: Adequate    SPECIAL CARE FACTORS FREQUENCY  PT (By licensed PT), OT (By licensed OT)     PT Frequency: x5 week OT Frequency: x5 week            Contractures Contractures Info: Not present    Additional Factors Info  Code Status, Allergies Code Status Info: FULL Allergies Info: Lipitor (Atorvastatin Calcium), Oxycodone           Current Medications (07/29/2021):  This is the current hospital active medication list Current Facility-Administered Medications  Medication Dose Route Frequency Provider Last Rate Last Admin   (feeding supplement) PROSource Plus liquid 30 mL  30 mL Oral Daily Elodia Florence., MD   30 mL at 07/28/21 0904   acetaminophen (TYLENOL) tablet 650 mg  650 mg Oral Q6H PRN Vianne Bulls, MD   650 mg at 07/28/21 3614   Or   acetaminophen (TYLENOL) suppository 650 mg  650 mg Rectal Q6H PRN Opyd, Ilene Qua, MD  alum & mag hydroxide-simeth (MAALOX/MYLANTA) 200-200-20 MG/5ML suspension 30 mL  30 mL Oral Q4H PRN Opyd, Ilene Qua, MD   30 mL at 07/28/21 0904   budesonide (PULMICORT) nebulizer solution 0.5 mg  0.5 mg Nebulization BID Amin, Ankit Chirag, MD   0.5 mg at 07/29/21 1509   buPROPion (WELLBUTRIN XL) 24 hr tablet 150 mg  150 mg Oral Daily Opyd, Ilene Qua, MD   150 mg at 07/29/21 7672   calcium carbonate (TUMS - dosed in mg elemental calcium) chewable tablet 200 mg of  elemental calcium  1 tablet Oral Daily PRN Elodia Florence., MD   200 mg of elemental calcium at 07/27/21 1337   Chlorhexidine Gluconate Cloth 2 % PADS 6 each  6 each Topical Daily Opyd, Ilene Qua, MD   6 each at 07/29/21 0950   feeding supplement (ENSURE ENLIVE / ENSURE PLUS) liquid 237 mL  237 mL Oral Q24H Elodia Florence., MD   237 mL at 07/27/21 1721   gelatin adsorbable (GELFOAM/SURGIFOAM) 12-7 MM sponge 12-7 mm            guaiFENesin-dextromethorphan (ROBITUSSIN DM) 100-10 MG/5ML syrup 5 mL  5 mL Oral Q4H PRN Elodia Florence., MD   5 mL at 07/26/21 1342   heparin ADULT infusion 100 units/mL (25000 units/250mL)  1,700 Units/hr Intravenous Continuous Dimple Nanas, RPH 17 mL/hr at 07/29/21 1615 1,700 Units/hr at 07/29/21 1615   insulin aspart (novoLOG) injection 0-15 Units  0-15 Units Subcutaneous TID WC Opyd, Ilene Qua, MD   2 Units at 07/28/21 1725   insulin aspart (novoLOG) injection 0-5 Units  0-5 Units Subcutaneous QHS Opyd, Ilene Qua, MD       insulin aspart (novoLOG) injection 3 Units  3 Units Subcutaneous TID WC Opyd, Ilene Qua, MD   3 Units at 07/28/21 1306   insulin glargine-yfgn (SEMGLEE) injection 25 Units  25 Units Subcutaneous QHS Opyd, Ilene Qua, MD   25 Units at 07/28/21 2144   ipratropium-albuterol (DUONEB) 0.5-2.5 (3) MG/3ML nebulizer solution 3 mL  3 mL Inhalation Q6H Amin, Ankit Chirag, MD   3 mL at 07/29/21 1507   ipratropium-albuterol (DUONEB) 0.5-2.5 (3) MG/3ML nebulizer solution 3 mL  3 mL Nebulization Q4H PRN Amin, Ankit Chirag, MD       lidocaine (XYLOCAINE) 1 % (with pres) injection            lip balm (CARMEX) ointment   Topical PRN Elodia Florence., MD       MEDLINE mouth rinse  15 mL Mouth Rinse BID Opyd, Ilene Qua, MD   15 mL at 07/29/21 0949   [START ON 07/30/2021] methylPREDNISolone sodium succinate (SOLU-MEDROL) 40 mg/mL injection 40 mg  40 mg Intravenous Daily Amin, Ankit Chirag, MD       multivitamin with minerals tablet 1 tablet  1  tablet Oral Daily Elodia Florence., MD   1 tablet at 07/29/21 0949   oxybutynin (DITROPAN-XL) 24 hr tablet 10 mg  10 mg Oral Daily Opyd, Ilene Qua, MD   10 mg at 07/29/21 0949   pantoprazole (PROTONIX) EC tablet 40 mg  40 mg Oral Daily Elodia Florence., MD   40 mg at 07/29/21 0949   polyethylene glycol (MIRALAX / GLYCOLAX) packet 17 g  17 g Oral Daily PRN Vianne Bulls, MD   17 g at 07/27/21 0910   simvastatin (ZOCOR) tablet 20 mg  20 mg Oral q1800 Opyd, Ilene Qua, MD  20 mg at 07/28/21 1725   sodium chloride (OCEAN) 0.65 % nasal spray 1 spray  1 spray Each Nare PRN Elodia Florence., MD   1 spray at 07/28/21 2215   sodium chloride flush (NS) 0.9 % injection 3 mL  3 mL Intravenous Q12H Opyd, Ilene Qua, MD   3 mL at 07/29/21 0950   tamsulosin (FLOMAX) capsule 0.4 mg  0.4 mg Oral BID Vianne Bulls, MD   0.4 mg at 07/29/21 8295     Discharge Medications: Please see discharge summary for a list of discharge medications.  Relevant Imaging Results:  Relevant Lab Results:   Additional Information [id number]  Purcell Mouton, RN

## 2021-07-29 NOTE — Sedation Documentation (Signed)
Liver biopsy procedure cancelled per Dr Anselm Pancoast.  Lesia Hausen, RN

## 2021-07-29 NOTE — Progress Notes (Signed)
Patient ID: Jacob Bishop, male   DOB: 15-Mar-1941, 80 y.o.   MRN: [id number] Patient was evaluated in [place] for possible liver lesion biopsy.  The lesion in left hepatic lobe is very hyperechoic and likely represents a hemangioma.  In addition, it would be difficult to biopsy the left hepatic lesion due to it's location and patient's tachypnea.  Could not see any additional liver lesions with [place], including in the caudate lobe. Liver lesions on CT could be further evaluated with a liver CT (would not recommend MR at this time due to patient's breathing issues).   US guided biopsy of liver was not performed.  Discussed biopsy options with Dr. Reesa Chew.  Pulmonary nodules are amendable to percutaneous biopsy based on location but would not recommend percutaneous biopsy at this time due to patient's respiratory issues.  Recommend pulmonary consultation.

## 2021-07-29 NOTE — Progress Notes (Signed)
ANTICOAGULATION CONSULT NOTE - follow up  Pharmacy Consult for Heparin Indication: pulmonary embolus, DVT's in R leg  Allergies  Allergen Reactions   Lipitor [Atorvastatin Calcium] Other (See Comments)    Muscle soreness   Oxycodone Anxiety    Causes patient to become shaky and dizzy. Unable to tolerate.   Patient Measurements: Height: 5\' 11"  (180.3 cm) Weight: 110 kg (242 lb 8.1 oz) IBW/kg (Calculated) : 75.3 Heparin Dosing Weight:   Vital Signs: Temp: 98.1 F (36.7 C) (12/04 2004) Temp Source: Oral (12/04 2004) BP: 143/61 (12/04 2004) Pulse Rate: 77 (12/04 2004)  Labs: Recent Labs    07/27/21 0326 07/27/21 1417 07/27/21 1629 07/28/21 0315 07/29/21 0409  HGB 11.0*  --   --   --  11.2*  HCT 33.0*  --   --   --  33.4*  PLT 111*  --   --   --  116*  HEPARINUNFRC 0.37  --   --  0.59 0.95*  CREATININE 0.75  --   --  0.80  --   TROPONINIHS  --  9 9  --   --     Estimated Creatinine Clearance: 97.6 mL/min (by C-G formula based on SCr of 0.8 mg/dL).  Medical History: Past Medical History:  Diagnosis Date   AI (aortic insufficiency) 01/11/2018   Trace, noted on ECHO   Arthritis    CAD in native artery    Nuclear stress test 10/19: EF 58, normal perfusion, low risk   Cellulitis    Dermatitis    Diabetes mellitus    type II   Diastolic dysfunction 97/09/6376   Mild, noted on ECHO   Diverticulosis 01/31/2016   ASCENDING COLON AND CECUM, NOTED ON COLONOSCOPY   DOE (dyspnea on exertion)    Edema    Fatty liver 09/18/2004   Noted on [place] ABD   History of kidney stones 05/23/2002   Noted on CT Abd   Hx of colonic polyps 01/31/2016   Hyperlipidemia    Hypertension    Internal hemorrhoids 01/31/2016   Noted on colonoscopy   Obesity    OSA (obstructive sleep apnea)    cpap   Positional vertigo    Pulmonary hypertension ([place]) 01/11/2018   Mild, noted on ECHO   Rhinosinusitis    Skin lesion    TR (tricuspid regurgitation) 01/11/2018   Trace, noted on ECHO     Medications:  Infusions:   Assessment: 59 yoM with PMH of recent Covid pneumonia (admitted 11/6-11/8), OSA, CAD, DM, HTN presents to ED with SOB.  CT angio with pulmonary embolus.  Preliminary lower extremity dopplers with acute DVT in R mid and distal femoral vein, R popliteal vein, R posterior tibial veins, and R peroneal veins. Pharmacy is consulted to dose heparin for PE.   No prior anticoagulation Baseline coags: aPTT 27 seconds, INR 1.2 Baseline CBC:  Hgb 13.2, Plt 118 (chronic thrombocytopenia)  HL 0.95 supra-therapeutic on 1850 units/hr CBC low but stable No reported bleeding  Goal of Therapy:  Heparin level 0.3-0.7 units/ml Monitor platelets by anticoagulation protocol: Yes   Plan:  RN confirmed heparin off at 0500 for liver biopsy F/u ability to transition to PO anticoagulation after procedure  Dolly Rias RPh 07/29/2021, 5:18 AM

## 2021-07-29 NOTE — Progress Notes (Addendum)
ANTICOAGULATION CONSULT NOTE - follow up  Pharmacy Consult for Heparin Indication: pulmonary embolus, DVT's in R leg  Allergies  Allergen Reactions   Lipitor [Atorvastatin Calcium] Other (See Comments)    Muscle soreness   Oxycodone Anxiety    Causes patient to become shaky and dizzy. Unable to tolerate.   Patient Measurements: Height: 5\' 11"  (180.3 cm) Weight: 110 kg (242 lb 8.1 oz) IBW/kg (Calculated) : 75.3 Heparin Dosing Weight:   Vital Signs: Temp: 98.2 F (36.8 C) (12/05 1537) Temp Source: Oral (12/05 1537) BP: 118/60 (12/05 1537) Pulse Rate: 97 (12/05 1537)  Labs: Recent Labs    07/27/21 0326 07/27/21 1417 07/27/21 1629 07/28/21 0315 07/29/21 0409  HGB 11.0*  --   --   --  11.2*  HCT 33.0*  --   --   --  33.4*  PLT 111*  --   --   --  116*  HEPARINUNFRC 0.37  --   --  0.59 0.95*  CREATININE 0.75  --   --  0.80  --   TROPONINIHS  --  9 9  --   --     Estimated Creatinine Clearance: 97.6 mL/min (by C-G formula based on SCr of 0.8 mg/dL).  Medical History: Past Medical History:  Diagnosis Date   AI (aortic insufficiency) 01/11/2018   Trace, noted on ECHO   Arthritis    CAD in native artery    Nuclear stress test 10/19: EF 58, normal perfusion, low risk   Cellulitis    Dermatitis    Diabetes mellitus    type II   Diastolic dysfunction 02/54/2706   Mild, noted on ECHO   Diverticulosis 01/31/2016   ASCENDING COLON AND CECUM, NOTED ON COLONOSCOPY   DOE (dyspnea on exertion)    Edema    Fatty liver 09/18/2004   Noted on [place] ABD   History of kidney stones 05/23/2002   Noted on CT Abd   Hx of colonic polyps 01/31/2016   Hyperlipidemia    Hypertension    Internal hemorrhoids 01/31/2016   Noted on colonoscopy   Obesity    OSA (obstructive sleep apnea)    cpap   Positional vertigo    Pulmonary hypertension ([place]) 01/11/2018   Mild, noted on ECHO   Rhinosinusitis    Skin lesion    TR (tricuspid regurgitation) 01/11/2018   Trace, noted on ECHO     Medications:  Infusions:   Assessment: 74 yoM with PMH of recent Covid pneumonia (admitted 11/6-11/8), OSA, CAD, DM, HTN presents to ED with SOB.  CT angio with pulmonary embolus.  Preliminary lower extremity dopplers with acute DVT in R mid and distal femoral vein, R popliteal vein, R posterior tibial veins, and R peroneal veins. Pharmacy is consulted to dose heparin for PE.   No prior anticoagulation Baseline coags: aPTT 27 seconds, INR 1.2 Baseline CBC:  Hgb 13.2, Plt 118 (chronic thrombocytopenia)  Today, 07/29/21 -Planned for liver biopsy today, however was unable to be performed due to location of lesion and patient's tachypnea (plan lung nodule biopsy once breathing improved) -Discussed with Dr. Reesa Chew - would like to resume heparin drip -HL 0.95 supra-therapeutic this AM on 1850 units/hr (previously therapeutic on this rate) - unable to confirm at this time where the HL was drawn in relation to the drip running. Will empirically reduce drip rate.  -CBC low but stable  Goal of Therapy:  Heparin level 0.3-0.7 units/ml Monitor platelets by anticoagulation protocol: Yes   Plan:  Reduce heparin  drip to 1700 units/hr Follow-up 8 hour HL Monitor CBC daily Follow-up plan for further procedures and ability to resume oral anticoagulation  Dimple Nanas, PharmD 07/29/2021 3:45 PM

## 2021-07-29 NOTE — Sedation Documentation (Signed)
Patient in Ultrasound. E. Trexler, A&Ox4. Obtaining VS.

## 2021-07-30 ENCOUNTER — Telehealth: Payer: Self-pay | Admitting: Pharmacist

## 2021-07-30 DIAGNOSIS — R0689 Other abnormalities of breathing: Secondary | ICD-10-CM

## 2021-07-30 DIAGNOSIS — F32A Depression, unspecified: Secondary | ICD-10-CM

## 2021-07-30 DIAGNOSIS — E1169 Type 2 diabetes mellitus with other specified complication: Secondary | ICD-10-CM | POA: Diagnosis not present

## 2021-07-30 DIAGNOSIS — I2699 Other pulmonary embolism without acute cor pulmonale: Secondary | ICD-10-CM | POA: Diagnosis not present

## 2021-07-30 DIAGNOSIS — I10 Essential (primary) hypertension: Secondary | ICD-10-CM | POA: Diagnosis not present

## 2021-07-30 LAB — CULTURE, BLOOD (ROUTINE X 2)
Culture: NO GROWTH
Culture: NO GROWTH
Special Requests: ADEQUATE
Special Requests: ADEQUATE

## 2021-07-30 LAB — BASIC METABOLIC PANEL
Anion gap: 6 (ref 5–15)
BUN: 20 mg/dL (ref 8–23)
CO2: 34 mmol/L — ABNORMAL HIGH (ref 22–32)
Calcium: 7.9 mg/dL — ABNORMAL LOW (ref 8.9–10.3)
Chloride: 93 mmol/L — ABNORMAL LOW (ref 98–111)
Creatinine, Ser: 0.87 mg/dL (ref 0.61–1.24)
GFR, Estimated: >60 mL/min (ref >60)
Glucose, Bld: 208 mg/dL — ABNORMAL HIGH (ref 70–99)
Potassium: 3.9 mmol/L (ref 3.5–5.1)
Sodium: 133 mmol/L — ABNORMAL LOW (ref 135–145)

## 2021-07-30 LAB — GLUCOSE, CAPILLARY
Glucose-Capillary: 116 mg/dL — ABNORMAL HIGH (ref 70–99)
Glucose-Capillary: 169 mg/dL — ABNORMAL HIGH (ref 70–99)
Glucose-Capillary: 257 mg/dL — ABNORMAL HIGH (ref 70–99)
Glucose-Capillary: 148 mg/dL — ABNORMAL HIGH (ref 70–99)

## 2021-07-30 LAB — CBC
HCT: 33.7 % — ABNORMAL LOW (ref 39.0–52.0)
Hemoglobin: 11.1 g/dL — ABNORMAL LOW (ref 13.0–17.0)
MCH: 30.2 pg (ref 26.0–34.0)
MCHC: 32.9 g/dL (ref 30.0–36.0)
MCV: 91.8 fL (ref 80.0–100.0)
Platelets: 108 10*3/uL — ABNORMAL LOW (ref 150–400)
RBC: 3.67 MIL/uL — ABNORMAL LOW (ref 4.22–5.81)
RDW: 13.4 % (ref 11.5–15.5)
WBC: 6.4 10*3/uL (ref 4.0–10.5)
nRBC: 0 % (ref 0.0–0.2)

## 2021-07-30 LAB — MAGNESIUM: Magnesium: 2.4 mg/dL (ref 1.7–2.4)

## 2021-07-30 LAB — HEPARIN LEVEL (UNFRACTIONATED): Heparin Unfractionated: 0.46 IU/mL (ref 0.30–0.70)

## 2021-07-30 MED ORDER — IPRATROPIUM-ALBUTEROL 0.5-2.5 (3) MG/3ML IN SOLN
3.0000 mL | Freq: Four times a day (QID) | RESPIRATORY_TRACT | Status: DC
Start: 2021-07-30 — End: 2021-07-30
  Administered 2021-07-30: 3 mL via RESPIRATORY_TRACT
  Filled 2021-07-30: qty 3

## 2021-07-30 MED ORDER — IPRATROPIUM-ALBUTEROL 0.5-2.5 (3) MG/3ML IN SOLN
3.0000 mL | Freq: Three times a day (TID) | RESPIRATORY_TRACT | Status: DC
  Administered 2021-07-30 – 2021-08-03 (×12): 3 mL via RESPIRATORY_TRACT
  Filled 2021-07-30 (×11): qty 3

## 2021-07-30 NOTE — Chronic Care Management (AMB) (Signed)
Chronic Care Management Pharmacy Assistant   Name: Jacob Bishop  MRN: [id number] DOB: [birth year]  Reason for Encounter: Medication Review / Medication Coordination Call   Conditions to be addressed/monitored: DMII  Recent office visits:  07/16/2021 Jacob Peng NP - Patient was seen for depression and additional issues. No medication changes. No follow up noted.  Recent consult visits:  None  Hospital visits:  Admitted to [facility] on 07/24/2021 due to Acute pulmonary embolus. Patient is currently inpatient.  New?Medications Started at [facility] Discharge:?? -started (no current information) Medication Changes at Hospital Discharge: -Changed  (no current information) Medications Discontinued at Hospital Discharge: -Stopped  (no current information)  Medications: Facility-Administered Encounter Medications as of 07/30/2021  Medication   (feeding supplement) PROSource Plus liquid 30 mL   acetaminophen (TYLENOL) tablet 650 mg   Or   acetaminophen (TYLENOL) suppository 650 mg   alum & mag hydroxide-simeth (MAALOX/MYLANTA) 200-200-20 MG/5ML suspension 30 mL   budesonide (PULMICORT) nebulizer solution 0.5 mg   buPROPion (WELLBUTRIN XL) 24 hr tablet 150 mg   calcium carbonate (TUMS - dosed in mg elemental calcium) chewable tablet 200 mg of elemental calcium   Chlorhexidine Gluconate Cloth 2 % PADS 6 each   feeding supplement (ENSURE ENLIVE / ENSURE PLUS) liquid 237 mL   guaiFENesin-dextromethorphan (ROBITUSSIN DM) 100-10 MG/5ML syrup 5 mL   heparin ADULT infusion 100 units/mL (25000 units/238mL)   insulin aspart (novoLOG) injection 0-15 Units   insulin aspart (novoLOG) injection 0-5 Units   insulin aspart (novoLOG) injection 3 Units   insulin glargine-yfgn (SEMGLEE) injection 25 Units   ipratropium-albuterol (DUONEB) 0.5-2.5 (3) MG/3ML nebulizer solution 3 mL   ipratropium-albuterol (DUONEB) 0.5-2.5 (3) MG/3ML nebulizer solution 3 mL   lip balm (CARMEX)  ointment   MEDLINE mouth rinse   methylPREDNISolone sodium succinate (SOLU-MEDROL) 40 mg/mL injection 40 mg   multivitamin with minerals tablet 1 tablet   oxybutynin (DITROPAN-XL) 24 hr tablet 10 mg   pantoprazole (PROTONIX) EC tablet 40 mg   polyethylene glycol (MIRALAX / GLYCOLAX) packet 17 g   simvastatin (ZOCOR) tablet 20 mg   sodium chloride (OCEAN) 0.65 % nasal spray 1 spray   sodium chloride flush (NS) 0.9 % injection 3 mL   tamsulosin (FLOMAX) capsule 0.4 mg   Outpatient Encounter Medications as of 07/30/2021  Medication Sig   albuterol (VENTOLIN HFA) 108 (90 Base) MCG/ACT inhaler TAKE 2 PUFFS BY MOUTH EVERY 6 HOURS AS NEEDED FOR WHEEZE OR SHORTNESS OF BREATH (Patient taking differently: 2 puffs every 6 (six) hours as needed for shortness of breath.)   buPROPion (WELLBUTRIN XL) 150 MG 24 hr tablet Take 1 tablet (150 mg total) by mouth daily.   Insulin Glargine (BASAGLAR KWIKPEN) 100 UNIT/ML INJECT 0.4 MLS (40 UNITS TOTAL) INTO THE SKIN AT BEDTIME. (Patient taking differently: Inject 40 Units into the skin at bedtime. INJECT 0.4 MLS (40 UNITS TOTAL) INTO THE SKIN AT BEDTIME.)   Insulin Pen Needle (BD PEN NEEDLE NANO U/F) 32G X 4 MM MISC USE AS DIRECTED TWICE A DAY   meclizine (ANTIVERT) 12.5 MG tablet TAKE ONE TABLET BY MOUTH three times daily AS NEEDED FOR dizziness (Patient taking differently: Take 12.5 mg by mouth 3 (three) times daily as needed.)   Misc. Devices (CAREX COCCYX CUSHION) MISC Use when sitting in chair or couch   oxybutynin (DITROPAN-XL) 10 MG 24 hr tablet Take 10 mg by mouth daily.   simvastatin (ZOCOR) 20 MG tablet Take 1 tablet (20 mg total) by  mouth daily at 6 PM. (Patient taking differently: Take 20 mg by mouth daily.)   tamsulosin (FLOMAX) 0.4 MG CAPS capsule Take 0.4 mg by mouth 2 (two) times daily.  Reviewed chart for medication changes ahead of medication coordination call.   BP Readings from Last 3 Encounters:  07/30/21 (!) 109/55  07/02/21 (!) 126/53   06/27/21 (!) 108/58    Lab Results  Component Value Date   HGBA1C 6.2 (H) 07/01/2021     Patient obtains medications through Vials  30 Days   Last adherence delivery included:  Simvastatin (ZOCOR) 20 mg: one tablet daily with evening meal Tamsulosin 0.4 mg: one capsule in the morning with breakfast and one capsule at bedtime Oxybutynin 10 mg XL: one tablet in the morning with breakfast Bupropion XL 150 mg: one tablet daily  Patient declined (meds) last month: Agricultural engineer (patient assistance) Meclizine 12.5 mg tablet: one tablet 3 times daily as needed (vials) Flonase nasal spray: two sprays in each nostril daily as needed (PRN) Doxycycline 100mg  - take 1 tablet at breakfast and 1 tablet with evening meal  Patient is due for next adherence delivery on: 08/12/2021.  Called patient, he is in the hospital.  This delivery to include: No delivery this month per patients request, he is in the hospital and doesn't know what medications will be changed.  Patient will need a short fill:None  Coordinated acute fill:None  Patient declined: All medications at this time, he is in the hospital.    Care Gaps: AWV - completed 02/04/21 Hepatitis C screen- never done. TDAP - never done. Shingrix - never done. Urine Microalbumin - overdue Colonoscopy - overdue  Covid-19 vaccine - overdue Foot exam - overdue Last BP - 108/58 on 06/27/2021 HGB A1C - 6.2 on 07/01/2021  Star Rating Drugs: Simvastatin 20mg  - last filled on 06/05/21 30DS at [place] [phone]

## 2021-07-30 NOTE — Progress Notes (Signed)
PROGRESS NOTE    Jacob Bishop  NLG:[id number] DOB: 1941-06-10 DOA: 07/24/2021 PCP: [name], NP   Brief Narrative:  80 year old with chronic hypoxic respiratory failure with COVID-19 infection, CAD, OSA, DM2, HTN, thrombocytopenia, liver and lung lesion concerning for metastatic disease admitted for shortness of breath found to have acute PE.  Due to metastatic lesion, IR consulted for biopsy.  Unable to perform liver biopsy as it was thought it could be hemangioma.  Patient needs lung biopsy once breathing is stabilized.  Seen by pulmonary.  Palliative care team consulted.   Assessment & Plan:   Principal Problem:   Acute pulmonary embolus (HCC) Active Problems:   Obstructive sleep apnea   Essential hypertension   CAD, NATIVE VESSEL   Thrombocytopenia (HCC)   Mass of lung   Lesion of liver   Acute on chronic respiratory failure with hypoxia (HCC)   DM type 2 with diabetic mixed hyperlipidemia (HCC)   Depression   Pressure injury of skin   Chest pain  Acute respiratory distress with hypoxia - Combination of pulmonary embolism and bilateral abnormal breath sounds.  This morning still remains on 10 L high flow.  Acute pulmonary embolism, subsegmental -Continue heparin drip for now.  Eventually will place back on Eliquis once we have obtained lung biopsy. - Echocardiogram EF normal. - Lower extremity ultrasound showed right lower extremity DVT  Bilateral diffuse abnormal breath sounds - Bronchodilators scheduled and as needed,   BNP 54.3, procalcitonin 0.2.  Out of bed to chair. -Very poor effort on I-S/flutter, continue encouraging him.  COVID-19 infection - 06/29/21. Now out of isolation. Repeat test here is negative. Procal 0.21  Metastatic lesions in the lung and liver - Unknown primary. -Unable to obtain liver biopsy as its thought to be hemangioma.  Eventually patient will need CT with liver protocol. - Pulmonary following.  Once oxygenation is better, he may  need bronc/IR guided lung biopsy. -In the meantime consulted palliative care.  Diabetes mellitus type 2 - On Semglee 25 units at bedtime.  Sliding scale and Accu-Cheks  Thrombocytopenia -No evidence of bleeding.  Continue to monitor  Depression - Wellbutrin  Hyperlipidemia - Zocor  Obstructive sleep apnea -Bedtime CPAP as needed  I have discussed risk and benefits of NOAC and Coumadin therapy.  Patient and family prefers NOAC eventually  DVT prophylaxis: Currently on heparin drip Code Status: Full code Family Communication: None at bedside. Called his wife, no answer.   Status is: Inpatient  Remains inpatient appropriate because: Still has significant amount of abnormal breath sounds.  He is not safe for discharge.       Nutritional status  Nutrition Problem: Increased nutrient needs Etiology: acute illness, chronic illness  Signs/Symptoms: estimated needs  Interventions: Ensure Enlive (each supplement provides 350kcal and 20 grams of protein), Prostat, MVI  Body mass index is 33.82 kg/m.  Pressure Injury 07/25/21 Buttocks Right Stage 2 -  Partial thickness loss of dermis presenting as a shallow open injury with a red, pink wound bed without slough. (Active)  07/25/21 1530  Location: Buttocks  Location Orientation: Right  Staging: Stage 2 -  Partial thickness loss of dermis presenting as a shallow open injury with a red, pink wound bed without slough.  Wound Description (Comments):   Present on Admission: Yes     Pressure Injury 07/25/21 Buttocks Left Stage 2 -  Partial thickness loss of dermis presenting as a shallow open injury with a red, pink wound bed without slough. (Active)  07/25/21  1531  Location: Buttocks  Location Orientation: Left  Staging: Stage 2 -  Partial thickness loss of dermis presenting as a shallow open injury with a red, pink wound bed without slough.  Wound Description (Comments):   Present on Admission: Yes           Subjective: Breathing is slightly better today.  Patient has very poor efforts on incentive spirometer and flutter valve.   Examination:  Constitutional: Not in acute distress, 10 L high flow Respiratory: Diffuse bilateral rhonchi Cardiovascular: Normal sinus rhythm, no rubs Abdomen: Nontender nondistended good bowel sounds Musculoskeletal: No edema noted Skin: No rashes seen Neurologic: CN 2-12 grossly intact.  And nonfocal Psychiatric: Normal judgment and insight. Alert and oriented x 3. Normal mood.  Objective: Vitals:   07/29/21 2136 07/29/21 2143 07/30/21 0448 07/30/21 0738  BP:   (!) 109/55   Pulse:   75   Resp:  18 20   Temp:   98.6 F (37 C)   TempSrc:      SpO2: 91%  93% 90%  Weight:      Height:        Intake/Output Summary (Last 24 hours) at 07/30/2021 1008 Last data filed at 07/30/2021 0813 Gross per 24 hour  Intake 391.03 ml  Output 650 ml  Net -258.97 ml   Filed Weights   07/25/21 1500 07/28/21 0414  Weight: 110 kg 110 kg     Data Reviewed:   CBC: Recent Labs  Lab 07/24/21 1800 07/25/21 0432 07/26/21 0322 07/27/21 0326 07/29/21 0409 07/30/21 0010  WBC 7.0 7.0 7.7 6.5 7.0 6.4  NEUTROABS 5.5  --  5.3 4.5  --   --   HGB 13.2 11.7* 11.5* 11.0* 11.2* 11.1*  HCT 39.7 35.2* 34.9* 33.0* 33.4* 33.7*  MCV 94.3 93.4 93.3 92.4 92.3 91.8  PLT 118* 107* 112* 111* 116* 195*   Basic Metabolic Panel: Recent Labs  Lab 07/25/21 0432 07/26/21 0322 07/27/21 0326 07/28/21 0315 07/30/21 0010  NA 134* 135 132* 131* 133*  K 3.6 4.0 3.6 3.9 3.9  CL 99 96* 96* 94* 93*  CO2 30 32 32 33* 34*  GLUCOSE 119* 112* 100* 119* 208*  BUN 12 13 15 14 20   CREATININE 0.78 0.84 0.75 0.80 0.87  CALCIUM 7.6* 7.9* 7.7* 7.6* 7.9*  MG 2.1 2.2 2.3 2.4 2.4  PHOS  --  3.4 3.7 3.4  --    GFR: Estimated Creatinine Clearance: 89.7 mL/min (by C-G formula based on SCr of 0.87 mg/dL). Liver Function Tests: Recent Labs  Lab 07/24/21 1800 07/25/21 0432  07/26/21 0322 07/27/21 0326 07/28/21 0315  AST 24 22 21 17 18   ALT 30 23 23 21 19   ALKPHOS 206* 184* 190* 180* 169*  BILITOT 1.5* 1.1 1.2 1.0 1.0  PROT 7.3 6.1* 6.0* 5.6* 5.8*  ALBUMIN 3.1* 2.5* 2.5* 2.4* 2.4*   No results for input(s): LIPASE, AMYLASE in the last 168 hours. No results for input(s): AMMONIA in the last 168 hours. Coagulation Profile: Recent Labs  Lab 07/24/21 2050  INR 1.2   Cardiac Enzymes: No results for input(s): CKTOTAL, CKMB, CKMBINDEX, TROPONINI in the last 168 hours. BNP (last 3 results) No results for input(s): PROBNP in the last 8760 hours. HbA1C: No results for input(s): HGBA1C in the last 72 hours. CBG: Recent Labs  Lab 07/29/21 0800 07/29/21 1112 07/29/21 1710 07/29/21 2007 07/30/21 0740  GLUCAP 110* 131* 168* 281* 116*   Lipid Profile: No results for input(s): CHOL, HDL, LDLCALC,  TRIG, CHOLHDL, LDLDIRECT in the last 72 hours. Thyroid Function Tests: No results for input(s): TSH, T4TOTAL, FREET4, T3FREE, THYROIDAB in the last 72 hours. Anemia Panel: No results for input(s): VITAMINB12, FOLATE, FERRITIN, TIBC, IRON, RETICCTPCT in the last 72 hours. Sepsis Labs: Recent Labs  Lab 07/24/21 1800 07/29/21 0409  PROCALCITON  --  0.21  LATICACIDVEN 1.4  --     Recent Results (from the past 240 hour(s))  Resp Panel by RT-PCR (Flu A&B, Covid) Nasopharyngeal Swab     Status: None   Collection Time: 07/24/21  6:00 PM   Specimen: Nasopharyngeal Swab; Nasopharyngeal(NP) swabs in vial transport medium  Result Value Ref Range Status   SARS Coronavirus 2 by RT PCR NEGATIVE NEGATIVE Final    Comment: (NOTE) SARS-CoV-2 target nucleic acids are NOT DETECTED.  The SARS-CoV-2 RNA is generally detectable in upper respiratory specimens during the acute phase of infection. The lowest concentration of SARS-CoV-2 viral copies this assay can detect is 138 copies/mL. A negative result does not preclude SARS-Cov-2 infection and should not be used as the  sole basis for treatment or other patient management decisions. A negative result may occur with  improper specimen collection/handling, submission of specimen other than nasopharyngeal swab, presence of viral mutation(s) within the areas targeted by this assay, and inadequate number of viral copies(<138 copies/mL). A negative result must be combined with clinical observations, patient history, and epidemiological information. The expected result is Negative.  Fact Sheet for Patients:  EntrepreneurPulse.com.au  Fact Sheet for Healthcare Providers:  IncredibleEmployment.be  This test is no t yet approved or cleared by the [place] FDA and  has been authorized for detection and/or diagnosis of SARS-CoV-2 by FDA under an Emergency Use Authorization (EUA). This EUA will remain  in effect (meaning this test can be used) for the duration of the COVID-19 declaration under Section 564(b)(1) of the Act, 21 U.S.C.section 360bbb-3(b)(1), unless the authorization is terminated  or revoked sooner.       Influenza A by PCR NEGATIVE NEGATIVE Final   Influenza B by PCR NEGATIVE NEGATIVE Final    Comment: (NOTE) The Xpert Xpress SARS-CoV-2/FLU/RSV plus assay is intended as an aid in the diagnosis of influenza from Nasopharyngeal swab specimens and should not be used as a sole basis for treatment. Nasal washings and aspirates are unacceptable for Xpert Xpress SARS-CoV-2/FLU/RSV testing.  Fact Sheet for Patients: EntrepreneurPulse.com.au  Fact Sheet for Healthcare Providers: IncredibleEmployment.be  This test is not yet approved or cleared by the [place] FDA and has been authorized for detection and/or diagnosis of SARS-CoV-2 by FDA under an Emergency Use Authorization (EUA). This EUA will remain in effect (meaning this test can be used) for the duration of the COVID-19 declaration under Section 564(b)(1) of the  Act, 21 U.S.C. section 360bbb-3(b)(1), unless the authorization is terminated or revoked.  Performed at [facility], [place] [address], [place], [place] [address]   Blood culture (routine x 2)     Status: None   Collection Time: 07/24/21  6:00 PM   Specimen: BLOOD  Result Value Ref Range Status   Specimen Description   Final    BLOOD RIGHT ANTECUBITAL Performed at [place] [address], [place], [place] [address]    Special Requests   Final    BOTTLES DRAWN AEROBIC AND ANAEROBIC Blood Culture adequate volume Performed at [place] [address], [place], [place] [address]    Culture   Final  NO GROWTH 5 DAYS Performed at [facility], [place] [address] [place], [place] [address]    Report Status 07/30/2021 FINAL  Final  Blood culture (routine x 2)     Status: None   Collection Time: 07/24/21  6:30 PM   Specimen: BLOOD  Result Value Ref Range Status   Specimen Description   Final    BLOOD LEFT ANTECUBITAL Performed at [facility] [address] [place], [place] [address]    Special Requests   Final    BOTTLES DRAWN AEROBIC AND ANAEROBIC Blood Culture adequate volume Performed at [place] [address] [place], [place] [address]    Culture   Final    NO GROWTH 5 DAYS Performed at [facility], [place] [address], [place], [place] [address]    Report Status 07/30/2021 FINAL  Final  MRSA Next Gen by PCR, Nasal     Status: None   Collection Time: 07/25/21  2:59 PM   Specimen: Nasal Mucosa; Nasal Swab  Result Value Ref Range Status   MRSA by PCR Next Gen NOT DETECTED NOT DETECTED Final    Comment: (NOTE) The GeneXpert MRSA Assay (FDA approved for NASAL specimens only), is one component of a comprehensive MRSA colonization surveillance program. It is not intended to diagnose MRSA infection nor to guide or monitor treatment for MRSA infections. Test  performance is not FDA approved in patients less than 35 years old. Performed at [facility], [place] [address], [place], [place] [address]          Radiology Studies: US Abdomen Limited  Result Date: 07/29/2021 CLINICAL DATA:  80 year old with multiple lung lesions and concern for neoplastic disease. In addition, there is concern for two hepatic lesions on prior CT imaging. Patient presents for ultrasound-guided liver lesion biopsy. EXAM: ULTRASOUND ABDOMEN LIMITED TECHNIQUE: Pearline Cables scale imaging of the right upper quadrant was performed to evaluate the liver. COMPARISON:  CTA chest 07/24/2021 and CT abdomen 06/24/2021 FINDINGS: Liver was evaluated with ultrasound. Oval shaped hyperechoic structure in the left hepatic lobe that corresponds with the abnormality on the previous CT imaging. This structure measures roughly 2.7 x 1.3 x 1.5 cm. Echogenicity of the lesion is suggestive for a possible hemangioma. No other lesions are confidently identified in the liver. IMPRESSION: 1. Only one hepatic lesion was identified. There is a hyperechoic lesion in left hepatic lobe that may represent a cavernous hemangioma. In addition, percutaneous biopsy of this lesion would be difficult due to location. Recommend liver protocol CT or MRI prior to percutaneous biopsy of this lesion. Would not recommend MRI at this time based on patient's respiratory status. 2. Ultrasound-guided liver biopsy was not performed. Electronically Signed   By: Markus Daft M.D.   On: 07/29/2021 15:18        Scheduled Meds:  (feeding supplement) PROSource Plus  30 mL Oral Daily   budesonide (PULMICORT) nebulizer solution  0.5 mg Nebulization BID   buPROPion  150 mg Oral Daily   Chlorhexidine Gluconate Cloth  6 each Topical Daily   feeding supplement  237 mL Oral Q24H   insulin aspart  0-15 Units Subcutaneous TID WC   insulin aspart  0-5 Units Subcutaneous QHS   insulin aspart  3 Units Subcutaneous TID WC    insulin glargine-yfgn  25 Units Subcutaneous QHS   ipratropium-albuterol  3 mL Inhalation TID   mouth rinse  15 mL Mouth Rinse BID   methylPREDNISolone (SOLU-MEDROL) injection  40 mg Intravenous Daily   multivitamin with minerals  1 tablet Oral Daily   oxybutynin  10 mg Oral Daily   pantoprazole  40 mg Oral Daily   simvastatin  20 mg Oral q1800   sodium chloride flush  3 mL Intravenous Q12H   tamsulosin  0.4 mg Oral BID   Continuous Infusions:  heparin 1,700 Units/hr (07/30/21 0813)     LOS: 6 days   Time spent= 35 mins    Aki Abalos Arsenio Loader, MD [facility]  If 7PM-7AM, please contact night-coverage  07/30/2021, 10:08 AM

## 2021-07-30 NOTE — Care Management Important Message (Signed)
Important Message  Patient Details IM Letter placed in Patients room. Name: Jacob Bishop MRN: [id number] Date of Birth: [birth year]   Medicare Important Message Given:  Yes     Kerin Salen 07/30/2021, 11:10 AM

## 2021-07-30 NOTE — Progress Notes (Signed)
Physical Therapy Treatment Patient Details Name: Jacob Bishop MRN: [id number] DOB: 02-02-1941 Today's Date: 07/30/2021   History of Present Illness 80 yo male admitted with PE, LE DVT. Hx of COVID, O2 dep at baseline-2L    PT Comments    Pt AxO x 3 very pleasant and willing.  Spouse at bed side.  Both memorable from 2 years ago when pt was here for TKR. Assisted OOB to amb was difficult and limited.  General bed mobility comments: Pt on 8 lts HFNC at 95% at rest.  Assist for trunk and LEs. Utilized bedpad to aid with scooting, positioning. Increased time.  Required a rest break x 4 min EOB due to dyspnea. General transfer comment: Used + 2 side by side assist this session to attempt gait.  Pt able to rise from elevated bed at Mod Assist present with intial posterior lean and instability.  "let me catch myself", stated pt.General Gait Details: remained on 8 lts HFNC pt tolerated amb 16 feet but needed x 3 standing rest breaks due to dyspnea and sats dropping to mid 80's.  VC's on pacing self and deep breathing.  Max c/o fatigue.  "I can't believe how weak I am". Positioned in recliner to comfort. Remained on 8 lts HFNC.   Pt will need ST Rehab at SNF prior to returning home with spouse.   Recommendations for follow up therapy are one component of a multi-disciplinary discharge planning process, led by the attending physician.  Recommendations may be updated based on patient status, additional functional criteria and insurance authorization.  Follow Up Recommendations  Skilled nursing-short term rehab (<3 hours/day)     Assistance Recommended at Discharge Frequent or constant Supervision/Assistance  Equipment Recommendations  None recommended by PT    Recommendations for Other Services       Precautions / Restrictions Precautions Precautions: None Precaution Comments: monitor O2-currently on HFNC Restrictions Weight Bearing Restrictions: No     Mobility  Bed Mobility Overal bed  mobility: Needs Assistance Bed Mobility: Supine to Sit     Supine to sit: Mod assist;HOB elevated     General bed mobility comments: Pt on 8 lts HFNC at 95% at rest.  Assist for trunk and LEs. Utilized bedpad to aid with scooting, positioning. Increased time.  Required a rest break x 4 min EOB due to dyspnea.    Transfers Overall transfer level: Needs assistance Equipment used: Rolling walker (2 wheels) Transfers: Sit to/from Stand Sit to Stand: From elevated surface;Mod assist;+2 safety/equipment           General transfer comment: Used + 2 side by side assist this session to attempt gait.  Pt able to rise from elevated bed at Mod Assist present with intial posterior lean and instability.  "let me catch myself", stated pt.    Ambulation/Gait Ambulation/Gait assistance: Min assist;+2 safety/equipment Gait Distance (Feet): 16 Feet Assistive device: Rolling walker (2 wheels) Gait Pattern/deviations: Step-through pattern;Decreased stride length Gait velocity: decreased     General Gait Details: remained on 8 lts HFNC pt tolerated amb 16 feet but needed x 3 standing rest breaks due to dyspnea and sats dropping to mid 80's.  VC's on pacing self and deep breathing.  Max c/o fatigue.  "I can't believe how weak I am".   Stairs             Wheelchair Mobility    Modified Rankin (Stroke Patients Only)       Balance  Cognition Arousal/Alertness: Awake/alert Behavior During Therapy: WFL for tasks assessed/performed Overall Cognitive Status: Within Functional Limits for tasks assessed                                 General Comments: AxO x 3 very pleasant and willing        Exercises      General Comments        Pertinent Vitals/Pain Pain Assessment: No/denies pain    Home Living                          Prior Function            PT Goals (current goals can now be  found in the care plan section) Progress towards PT goals: Progressing toward goals    Frequency    Min 2X/week      PT Plan Current plan remains appropriate    Co-evaluation              AM-PAC PT "6 Clicks" Mobility   Outcome Measure  Help needed turning from your back to your side while in a flat bed without using bedrails?: A Little Help needed moving from lying on your back to sitting on the side of a flat bed without using bedrails?: A Little Help needed moving to and from a bed to a chair (including a wheelchair)?: A Lot Help needed standing up from a chair using your arms (e.g., wheelchair or bedside chair)?: A Lot Help needed to walk in hospital room?: A Lot Help needed climbing 3-5 steps with a railing? : Total 6 Click Score: 13    End of Session Equipment Utilized During Treatment: Gait belt Activity Tolerance: Patient limited by fatigue Patient left: in chair;with call bell/phone within reach;with family/visitor present Nurse Communication: Mobility status PT Visit Diagnosis: Muscle weakness (generalized) (M62.81);Difficulty in walking, not elsewhere classified (R26.2)     Time: 6384-6659 PT Time Calculation (min) (ACUTE ONLY): 24 min  Charges:  $Gait Training: 8-22 mins $Therapeutic Activity: 8-22 mins                    Rica Koyanagi  PTA Acute  Rehabilitation Services Pager      [phone] Office      [phone]

## 2021-07-30 NOTE — Progress Notes (Signed)
ANTICOAGULATION CONSULT NOTE - follow up  Pharmacy Consult for Heparin Indication: pulmonary embolus, DVT's in R leg  Allergies  Allergen Reactions   Lipitor [Atorvastatin Calcium] Other (See Comments)    Muscle soreness   Oxycodone Anxiety    Causes patient to become shaky and dizzy. Unable to tolerate.   Patient Measurements: Height: 5\' 11"  (180.3 cm) Weight: 110 kg (242 lb 8.1 oz) IBW/kg (Calculated) : 75.3 Heparin Dosing Weight:   Vital Signs: Temp: 98 F (36.7 C) (12/05 2013) Temp Source: Oral (12/05 2013) BP: 128/69 (12/05 2013) Pulse Rate: 54 (12/05 2013)  Labs: Recent Labs    07/27/21 0326 07/27/21 1417 07/27/21 1629 07/28/21 0315 07/29/21 0409 07/30/21 0010  HGB 11.0*  --   --   --  11.2* 11.1*  HCT 33.0*  --   --   --  33.4* 33.7*  PLT 111*  --   --   --  116* 108*  HEPARINUNFRC 0.37  --   --  0.59 0.95* 0.46  CREATININE 0.75  --   --  0.80  --  0.87  TROPONINIHS  --  9 9  --   --   --     Estimated Creatinine Clearance: 89.7 mL/min (by C-G formula based on SCr of 0.87 mg/dL).  Medical History: Past Medical History:  Diagnosis Date   AI (aortic insufficiency) 01/11/2018   Trace, noted on ECHO   Arthritis    CAD in native artery    Nuclear stress test 10/19: EF 58, normal perfusion, low risk   Cellulitis    Dermatitis    Diabetes mellitus    type II   Diastolic dysfunction 60/73/7106   Mild, noted on ECHO   Diverticulosis 01/31/2016   ASCENDING COLON AND CECUM, NOTED ON COLONOSCOPY   DOE (dyspnea on exertion)    Edema    Fatty liver 09/18/2004   Noted on [place] ABD   History of kidney stones 05/23/2002   Noted on CT Abd   Hx of colonic polyps 01/31/2016   Hyperlipidemia    Hypertension    Internal hemorrhoids 01/31/2016   Noted on colonoscopy   Obesity    OSA (obstructive sleep apnea)    cpap   Positional vertigo    Pulmonary hypertension ([place]) 01/11/2018   Mild, noted on ECHO   Rhinosinusitis    Skin lesion    TR (tricuspid  regurgitation) 01/11/2018   Trace, noted on ECHO    Medications:  Infusions:   Assessment: 32 yoM with PMH of recent Covid pneumonia (admitted 11/6-11/8), OSA, CAD, DM, HTN presents to ED with SOB.  CT angio with pulmonary embolus.  Preliminary lower extremity dopplers with acute DVT in R mid and distal femoral vein, R popliteal vein, R posterior tibial veins, and R peroneal veins. Pharmacy is consulted to dose heparin for PE.   No prior anticoagulation Baseline coags: aPTT 27 seconds, INR 1.2 Baseline CBC:  Hgb 13.2, Plt 118 (chronic thrombocytopenia)  Today, 07/30/21 -HL 0.46 (therapeutic) with heparin gtt @ 1700 units/hr   -CBC low but stable  Goal of Therapy:  Heparin level 0.3-0.7 units/ml Monitor platelets by anticoagulation protocol: Yes   Plan:  Continue heparin drip @ 1700 units/hr Monitor HL and CBC daily Follow-up plan for further procedures and ability to resume oral anticoagulation  Leone Haven, PharmD 07/30/2021 1:00 AM

## 2021-07-31 ENCOUNTER — Inpatient Hospital Stay (HOSPITAL_COMMUNITY): Payer: Medicare Other

## 2021-07-31 DIAGNOSIS — R079 Chest pain, unspecified: Secondary | ICD-10-CM

## 2021-07-31 DIAGNOSIS — J9621 Acute and chronic respiratory failure with hypoxia: Secondary | ICD-10-CM | POA: Diagnosis not present

## 2021-07-31 DIAGNOSIS — I2699 Other pulmonary embolism without acute cor pulmonale: Secondary | ICD-10-CM | POA: Diagnosis not present

## 2021-07-31 DIAGNOSIS — I2510 Atherosclerotic heart disease of native coronary artery without angina pectoris: Secondary | ICD-10-CM | POA: Diagnosis not present

## 2021-07-31 LAB — GLUCOSE, CAPILLARY
Glucose-Capillary: 102 mg/dL — ABNORMAL HIGH (ref 70–99)
Glucose-Capillary: 150 mg/dL — ABNORMAL HIGH (ref 70–99)
Glucose-Capillary: 171 mg/dL — ABNORMAL HIGH (ref 70–99)
Glucose-Capillary: 167 mg/dL — ABNORMAL HIGH (ref 70–99)

## 2021-07-31 LAB — CBC
HCT: 34.7 % — ABNORMAL LOW (ref 39.0–52.0)
Hemoglobin: 11.5 g/dL — ABNORMAL LOW (ref 13.0–17.0)
MCH: 30.7 pg (ref 26.0–34.0)
MCHC: 33.1 g/dL (ref 30.0–36.0)
MCV: 92.8 fL (ref 80.0–100.0)
Platelets: 115 10*3/uL — ABNORMAL LOW (ref 150–400)
RBC: 3.74 MIL/uL — ABNORMAL LOW (ref 4.22–5.81)
RDW: 13.5 % (ref 11.5–15.5)
WBC: 5.8 10*3/uL (ref 4.0–10.5)
nRBC: 0 % (ref 0.0–0.2)

## 2021-07-31 LAB — BASIC METABOLIC PANEL
Anion gap: 7 (ref 5–15)
BUN: 22 mg/dL (ref 8–23)
CO2: 33 mmol/L — ABNORMAL HIGH (ref 22–32)
Calcium: 8 mg/dL — ABNORMAL LOW (ref 8.9–10.3)
Chloride: 94 mmol/L — ABNORMAL LOW (ref 98–111)
Creatinine, Ser: 0.75 mg/dL (ref 0.61–1.24)
GFR, Estimated: >60 mL/min (ref >60)
Glucose, Bld: 109 mg/dL — ABNORMAL HIGH (ref 70–99)
Potassium: 3.8 mmol/L (ref 3.5–5.1)
Sodium: 134 mmol/L — ABNORMAL LOW (ref 135–145)

## 2021-07-31 LAB — HEPARIN LEVEL (UNFRACTIONATED)
Heparin Unfractionated: 1.1 IU/mL — ABNORMAL HIGH (ref 0.30–0.70)
Heparin Unfractionated: 0.87 IU/mL — ABNORMAL HIGH (ref 0.30–0.70)

## 2021-07-31 LAB — MAGNESIUM: Magnesium: 2.5 mg/dL — ABNORMAL HIGH (ref 1.7–2.4)

## 2021-07-31 MED ORDER — FLUTICASONE PROPIONATE 50 MCG/ACT NA SUSP
2.0000 | Freq: Every day | NASAL | Status: AC
  Administered 2021-08-01 – 2021-08-07 (×7): 2 via NASAL
  Filled 2021-07-31: qty 16

## 2021-07-31 MED ORDER — HEPARIN (PORCINE) 25000 UT/250ML-% IV SOLN
1500.0000 [IU]/h | INTRAVENOUS | Status: DC
Start: 2021-07-31 — End: 2021-07-31
  Administered 2021-07-31: 1500 [IU]/h via INTRAVENOUS
  Filled 2021-07-31: qty 250

## 2021-07-31 MED ORDER — LORATADINE 10 MG PO TABS
10.0000 mg | ORAL_TABLET | Freq: Every day | ORAL | Status: DC
  Administered 2021-07-31 – 2021-08-25 (×26): 10 mg via ORAL
  Filled 2021-07-31 (×26): qty 1

## 2021-07-31 MED ORDER — HEPARIN (PORCINE) 25000 UT/250ML-% IV SOLN
1200.0000 [IU]/h | INTRAVENOUS | Status: DC
Start: 2021-07-31 — End: 2021-08-01
  Administered 2021-08-01: 1200 [IU]/h via INTRAVENOUS
  Filled 2021-07-31 (×2): qty 250

## 2021-07-31 NOTE — TOC Progression Note (Signed)
Transition of Care [facility]) - Progression Note    Patient Details  Name: Jacob Bishop MRN: [id number] Date of Birth: 11-25-1940  [facility] CM/SW Contact  Purcell Mouton, RN Phone Number: 07/31/2021, 3:10 PM  Clinical Narrative:    Pt from home with spouse and [facility] for [facility].    Expected Discharge Plan: [place] Barriers to Discharge: No Barriers Identified  Expected Discharge Plan and Services Expected Discharge Plan: [place] arrangements for the past 2 months: Single Family Home                                       Social Determinants of Health (SDOH) Interventions    Readmission Risk Interventions No flowsheet data found.

## 2021-07-31 NOTE — Progress Notes (Signed)
Pt refused CPAP qhs.  Pt encouraged to contact RT should he change his mind.   

## 2021-07-31 NOTE — Progress Notes (Signed)
ANTICOAGULATION CONSULT NOTE - follow up  Pharmacy Consult for Heparin Indication: pulmonary embolus, DVT's in R leg  Allergies  Allergen Reactions   Lipitor [Atorvastatin Calcium] Other (See Comments)    Muscle soreness   Oxycodone Anxiety    Causes patient to become shaky and dizzy. Unable to tolerate.   Patient Measurements: Height: 5\' 11"  (180.3 cm) Weight: 110 kg (242 lb 8.1 oz) IBW/kg (Calculated) : 75.3 Heparin Dosing Weight: 99 kg  Vital Signs: Temp: 97.7 F (36.5 C) (12/06 2115) Temp Source: Oral (12/06 2115) BP: 122/67 (12/06 2115) Pulse Rate: 79 (12/06 2115)  Labs: Recent Labs    07/29/21 0409 07/30/21 0010 07/31/21 0406  HGB 11.2* 11.1* 11.5*  HCT 33.4* 33.7* 34.7*  PLT 116* 108* 115*  HEPARINUNFRC 0.95* 0.46 1.10*  CREATININE  --  0.87 0.75    Estimated Creatinine Clearance: 97.6 mL/min (by C-G formula based on SCr of 0.75 mg/dL).  Medical History: Past Medical History:  Diagnosis Date   AI (aortic insufficiency) 01/11/2018   Trace, noted on ECHO   Arthritis    CAD in native artery    Nuclear stress test 10/19: EF 58, normal perfusion, low risk   Cellulitis    Dermatitis    Diabetes mellitus    type II   Diastolic dysfunction 46/56/8127   Mild, noted on ECHO   Diverticulosis 01/31/2016   ASCENDING COLON AND CECUM, NOTED ON COLONOSCOPY   DOE (dyspnea on exertion)    Edema    Fatty liver 09/18/2004   Noted on [place] ABD   History of kidney stones 05/23/2002   Noted on CT Abd   Hx of colonic polyps 01/31/2016   Hyperlipidemia    Hypertension    Internal hemorrhoids 01/31/2016   Noted on colonoscopy   Obesity    OSA (obstructive sleep apnea)    cpap   Positional vertigo    Pulmonary hypertension ([place]) 01/11/2018   Mild, noted on ECHO   Rhinosinusitis    Skin lesion    TR (tricuspid regurgitation) 01/11/2018   Trace, noted on ECHO    Medications:  Infusions:   Assessment: 49 yoM with PMH of recent Covid pneumonia (admitted  11/6-11/8), OSA, CAD, DM, HTN presents to ED with SOB.  CT angio with pulmonary embolus.  Preliminary lower extremity dopplers with acute DVT in R mid and distal femoral vein, R popliteal vein, R posterior tibial veins, and R peroneal veins. Pharmacy is consulted to dose heparin for PE.   No prior anticoagulation Baseline coags: aPTT 27 seconds, INR 1.2 Baseline CBC:  Hgb 13.2, Plt 118 (chronic thrombocytopenia)  Today, 07/31/21 -HL 1.1 (supratherapeutic) with heparin gtt @ 1700 units/hr   - No complications of therapy noted - Confirmed with patient and RN that heparin level lab was drawn from arm opposite that of infusing heparin -CBC:  Hgb 11.5; PLTC 115K - SCr = 0.75  Goal of Therapy:  Heparin level 0.3-0.7 units/ml Monitor platelets by anticoagulation protocol: Yes   Plan:  Hold heparin gtt x 1 hr then resume heparin gtt at decreased rate of 1500 units/hr Check HL 8 hr after heparin resumed at lower rate Monitor HL and CBC daily Follow-up plan for further procedures and ability to resume oral anticoagulation  Leone Haven, PharmD 07/31/2021 4:50 AM

## 2021-07-31 NOTE — Progress Notes (Signed)
PROGRESS NOTE    Jacob Bishop  XIP:[id number] DOB: 1940-11-27 DOA: 07/24/2021 PCP: [name], NP   Brief Narrative:  80 year old with chronic hypoxic respiratory failure with COVID-19 infection, CAD, OSA, DM2, HTN, thrombocytopenia, liver and lung lesion concerning for metastatic disease admitted for shortness of breath found to have acute PE.  Due to metastatic lesion, IR consulted for biopsy.  Unable to perform liver biopsy as it was thought it could be hemangioma.  Patient needs lung biopsy once breathing is stabilized.  Seen by pulmonary.  Palliative care team consulted.   Assessment & Plan:   Principal Problem:   Acute pulmonary embolus (HCC) Active Problems:   Obstructive sleep apnea   Essential hypertension   CAD, NATIVE VESSEL   Thrombocytopenia (HCC)   Mass of lung   Lesion of liver   Acute on chronic respiratory failure with hypoxia (HCC)   DM type 2 with diabetic mixed hyperlipidemia (HCC)   Depression   Pressure injury of skin   Chest pain  Acute respiratory distress with hypoxia - Combination of abnormal breath sounds and pulmonary embolism.  Still remains on 9 L high flow.  Add humidified air  Acute pulmonary embolism, subsegmental -Continue heparin drip for now.  Eventually will be placed back on Eliquis. - Echocardiogram EF normal. - Lower extremity ultrasound showed right lower extremity DVT  Bilateral diffuse abnormal breath sounds - Bronchodilators scheduled and as needed,   BNP 54.3, procalcitonin 0.2.  Out of bed to chair.  On Solu-Medrol. - Very poor effort with I-S/flutter.  Continue to encourage him. Repeat CXR today  COVID-19 infection - 06/29/21. Now out of isolation. Repeat test here is negative. Procal 0.21  Metastatic lesions in the lung and liver - Unknown primary; Unable to obtain liver biopsy as its thought to be hemangioma.  Eventually patient will need CT with liver protocol.  Palliative care team is following.  Patient is very high  risk for bronchoscopy at this time per pulmonary.  Patient is not sure if he would like this lung nodule to be evaluated at this time therefore would like to hold off on oncology evaluation.  Diabetes mellitus type 2 - On Semglee 25 units at bedtime.  Sliding scale and Accu-Cheks  Thrombocytopenia -No evidence of bleeding.  Continue to monitor  Depression - Wellbutrin  Hyperlipidemia - Zocor  Obstructive sleep apnea -Bedtime CPAP as needed  I have discussed risk and benefits of NOAC and Coumadin therapy.  Patient and family prefers NOAC eventually.  In the meantime palliative care team consulted to help establish goals of care.  Overall patient remains ill requiring high amounts of oxygen.  Poor respiratory efforts.  DVT prophylaxis:  Heparin Drip. Code Status: Full code Family Communication: None at bedside, wife called no answer.   Status is: Inpatient  Remains inpatient appropriate because: Still has significant amount of abnormal breath sounds.  He is not safe for discharge.       Nutritional status  Nutrition Problem: Increased nutrient needs Etiology: acute illness, chronic illness  Signs/Symptoms: estimated needs  Interventions: Ensure Enlive (each supplement provides 350kcal and 20 grams of protein), Prostat, MVI  Body mass index is 33.82 kg/m.  Pressure Injury 07/25/21 Buttocks Right Stage 2 -  Partial thickness loss of dermis presenting as a shallow open injury with a red, pink wound bed without slough. (Active)  07/25/21 1530  Location: Buttocks  Location Orientation: Right  Staging: Stage 2 -  Partial thickness loss of dermis presenting as a  shallow open injury with a red, pink wound bed without slough.  Wound Description (Comments):   Present on Admission: Yes     Pressure Injury 07/25/21 Buttocks Left Stage 2 -  Partial thickness loss of dermis presenting as a shallow open injury with a red, pink wound bed without slough. (Active)  07/25/21 1531   Location: Buttocks  Location Orientation: Left  Staging: Stage 2 -  Partial thickness loss of dermis presenting as a shallow open injury with a red, pink wound bed without slough.  Wound Description (Comments):   Present on Admission: Yes          Subjective: Breathing is more or less the same today. Patient tells me hefeels better but I think he is still shallow breathing. Complaints of nose congestion.    Examination: Constitutional: Not in acute distress, 9 L high flow.  Appears chronically ill Respiratory: Diffuse bilateral rhonchi.  Shallow breathing. Cardiovascular: Normal sinus rhythm, no rubs Abdomen: Nontender nondistended good bowel sounds Musculoskeletal: No edema noted Skin: No rashes seen Neurologic: CN 2-12 grossly intact.  And nonfocal Psychiatric: Normal judgment and insight. Alert and oriented x 3. Normal mood.    Objective: Vitals:   07/30/21 2031 07/30/21 2115 07/31/21 0505 07/31/21 0906  BP:  122/67 116/64   Pulse: 78 79 73   Resp: 18 20 18    Temp:  97.7 F (36.5 C) 98 F (36.7 C)   TempSrc:  Oral Axillary   SpO2: 96% 95% 94% 94%  Weight:      Height:        Intake/Output Summary (Last 24 hours) at 07/31/2021 1125 Last data filed at 07/31/2021 0600 Gross per 24 hour  Intake 410.02 ml  Output 750 ml  Net -339.98 ml   Filed Weights   07/25/21 1500 07/28/21 0414  Weight: 110 kg 110 kg     Data Reviewed:   CBC: Recent Labs  Lab 07/24/21 1800 07/25/21 0432 07/26/21 0322 07/27/21 0326 07/29/21 0409 07/30/21 0010 07/31/21 0406  WBC 7.0   < > 7.7 6.5 7.0 6.4 5.8  NEUTROABS 5.5  --  5.3 4.5  --   --   --   HGB 13.2   < > 11.5* 11.0* 11.2* 11.1* 11.5*  HCT 39.7   < > 34.9* 33.0* 33.4* 33.7* 34.7*  MCV 94.3   < > 93.3 92.4 92.3 91.8 92.8  PLT 118*   < > 112* 111* 116* 108* 115*   < > = values in this interval not displayed.   Basic Metabolic Panel: Recent Labs  Lab 07/26/21 0322 07/27/21 0326 07/28/21 0315 07/30/21 0010  07/31/21 0406  NA 135 132* 131* 133* 134*  K 4.0 3.6 3.9 3.9 3.8  CL 96* 96* 94* 93* 94*  CO2 32 32 33* 34* 33*  GLUCOSE 112* 100* 119* 208* 109*  BUN 13 15 14 20 22   CREATININE 0.84 0.75 0.80 0.87 0.75  CALCIUM 7.9* 7.7* 7.6* 7.9* 8.0*  MG 2.2 2.3 2.4 2.4 2.5*  PHOS 3.4 3.7 3.4  --   --    GFR: Estimated Creatinine Clearance: 97.6 mL/min (by C-G formula based on SCr of 0.75 mg/dL). Liver Function Tests: Recent Labs  Lab 07/24/21 1800 07/25/21 0432 07/26/21 0322 07/27/21 0326 07/28/21 0315  AST 24 22 21 17 18   ALT 30 23 23 21 19   ALKPHOS 206* 184* 190* 180* 169*  BILITOT 1.5* 1.1 1.2 1.0 1.0  PROT 7.3 6.1* 6.0* 5.6* 5.8*  ALBUMIN 3.1* 2.5* 2.5*  2.4* 2.4*   No results for input(s): LIPASE, AMYLASE in the last 168 hours. No results for input(s): AMMONIA in the last 168 hours. Coagulation Profile: Recent Labs  Lab 07/24/21 2050  INR 1.2   Cardiac Enzymes: No results for input(s): CKTOTAL, CKMB, CKMBINDEX, TROPONINI in the last 168 hours. BNP (last 3 results) No results for input(s): PROBNP in the last 8760 hours. HbA1C: No results for input(s): HGBA1C in the last 72 hours. CBG: Recent Labs  Lab 07/30/21 0740 07/30/21 1112 07/30/21 1618 07/30/21 2117 07/31/21 0737  GLUCAP 116* 169* 257* 148* 102*   Lipid Profile: No results for input(s): CHOL, HDL, LDLCALC, TRIG, CHOLHDL, LDLDIRECT in the last 72 hours. Thyroid Function Tests: No results for input(s): TSH, T4TOTAL, FREET4, T3FREE, THYROIDAB in the last 72 hours. Anemia Panel: No results for input(s): VITAMINB12, FOLATE, FERRITIN, TIBC, IRON, RETICCTPCT in the last 72 hours. Sepsis Labs: Recent Labs  Lab 07/24/21 1800 07/29/21 0409  PROCALCITON  --  0.21  LATICACIDVEN 1.4  --     Recent Results (from the past 240 hour(s))  Resp Panel by RT-PCR (Flu A&B, Covid) Nasopharyngeal Swab     Status: None   Collection Time: 07/24/21  6:00 PM   Specimen: Nasopharyngeal Swab; Nasopharyngeal(NP) swabs in vial  transport medium  Result Value Ref Range Status   SARS Coronavirus 2 by RT PCR NEGATIVE NEGATIVE Final    Comment: (NOTE) SARS-CoV-2 target nucleic acids are NOT DETECTED.  The SARS-CoV-2 RNA is generally detectable in upper respiratory specimens during the acute phase of infection. The lowest concentration of SARS-CoV-2 viral copies this assay can detect is 138 copies/mL. A negative result does not preclude SARS-Cov-2 infection and should not be used as the sole basis for treatment or other patient management decisions. A negative result may occur with  improper specimen collection/handling, submission of specimen other than nasopharyngeal swab, presence of viral mutation(s) within the areas targeted by this assay, and inadequate number of viral copies(<138 copies/mL). A negative result must be combined with clinical observations, patient history, and epidemiological information. The expected result is Negative.  Fact Sheet for Patients:  EntrepreneurPulse.com.au  Fact Sheet for Healthcare Providers:  IncredibleEmployment.be  This test is no t yet approved or cleared by the [place] FDA and  has been authorized for detection and/or diagnosis of SARS-CoV-2 by FDA under an Emergency Use Authorization (EUA). This EUA will remain  in effect (meaning this test can be used) for the duration of the COVID-19 declaration under Section 564(b)(1) of the Act, 21 U.S.C.section 360bbb-3(b)(1), unless the authorization is terminated  or revoked sooner.       Influenza A by PCR NEGATIVE NEGATIVE Final   Influenza B by PCR NEGATIVE NEGATIVE Final    Comment: (NOTE) The Xpert Xpress SARS-CoV-2/FLU/RSV plus assay is intended as an aid in the diagnosis of influenza from Nasopharyngeal swab specimens and should not be used as a sole basis for treatment. Nasal washings and aspirates are unacceptable for Xpert Xpress SARS-CoV-2/FLU/RSV testing.  Fact  Sheet for Patients: EntrepreneurPulse.com.au  Fact Sheet for Healthcare Providers: IncredibleEmployment.be  This test is not yet approved or cleared by the [place] FDA and has been authorized for detection and/or diagnosis of SARS-CoV-2 by FDA under an Emergency Use Authorization (EUA). This EUA will remain in effect (meaning this test can be used) for the duration of the COVID-19 declaration under Section 564(b)(1) of the Act, 21 U.S.C. section 360bbb-3(b)(1), unless the authorization is terminated or revoked.  Performed at  [facility], [place] [address], [place], [place] [address]   Blood culture (routine x 2)     Status: None   Collection Time: 07/24/21  6:00 PM   Specimen: BLOOD  Result Value Ref Range Status   Specimen Description   Final    BLOOD RIGHT ANTECUBITAL Performed at [place] [address], [place], [place] [address]    Special Requests   Final    BOTTLES DRAWN AEROBIC AND ANAEROBIC Blood Culture adequate volume Performed at [place] [address], [place], [place] [address]    Culture   Final    NO GROWTH 5 DAYS Performed at [facility], [place] [address], [place], [place] [address]    Report Status 07/30/2021 FINAL  Final  Blood culture (routine x 2)     Status: None   Collection Time: 07/24/21  6:30 PM   Specimen: BLOOD  Result Value Ref Range Status   Specimen Description   Final    BLOOD LEFT ANTECUBITAL Performed at [place] [address], [place], [name] [address]    Special Requests   Final    BOTTLES DRAWN AEROBIC AND ANAEROBIC Blood Culture adequate volume Performed at [place] [address] [place], [place] [address]    Culture   Final    NO GROWTH 5 DAYS Performed at [facility], [place] [address], [place], [place] [address]    Report Status 07/30/2021 FINAL  Final  MRSA  Next Gen by PCR, Nasal     Status: None   Collection Time: 07/25/21  2:59 PM   Specimen: Nasal Mucosa; Nasal Swab  Result Value Ref Range Status   MRSA by PCR Next Gen NOT DETECTED NOT DETECTED Final    Comment: (NOTE) The GeneXpert MRSA Assay (FDA approved for NASAL specimens only), is one component of a comprehensive MRSA colonization surveillance program. It is not intended to diagnose MRSA infection nor to guide or monitor treatment for MRSA infections. Test performance is not FDA approved in patients less than 57 years old. Performed at [facility], [place] [address] [place], [place] [address]          Radiology Studies: US Abdomen Limited  Result Date: 07/29/2021 CLINICAL DATA:  80 year old with multiple lung lesions and concern for neoplastic disease. In addition, there is concern for two hepatic lesions on prior CT imaging. Patient presents for ultrasound-guided liver lesion biopsy. EXAM: ULTRASOUND ABDOMEN LIMITED TECHNIQUE: Pearline Cables scale imaging of the right upper quadrant was performed to evaluate the liver. COMPARISON:  CTA chest 07/24/2021 and CT abdomen 06/24/2021 FINDINGS: Liver was evaluated with ultrasound. Oval shaped hyperechoic structure in the left hepatic lobe that corresponds with the abnormality on the previous CT imaging. This structure measures roughly 2.7 x 1.3 x 1.5 cm. Echogenicity of the lesion is suggestive for a possible hemangioma. No other lesions are confidently identified in the liver. IMPRESSION: 1. Only one hepatic lesion was identified. There is a hyperechoic lesion in left hepatic lobe that may represent a cavernous hemangioma. In addition, percutaneous biopsy of this lesion would be difficult due to location. Recommend liver protocol CT or MRI prior to percutaneous biopsy of this lesion. Would not recommend MRI at this time based on patient's respiratory status. 2. Ultrasound-guided liver biopsy was not performed. Electronically Signed    By: Markus Daft M.D.   On: 07/29/2021 15:18  Scheduled Meds:  (feeding supplement) PROSource Plus  30 mL Oral Daily   budesonide (PULMICORT) nebulizer solution  0.5 mg Nebulization BID   buPROPion  150 mg Oral Daily   Chlorhexidine Gluconate Cloth  6 each Topical Daily   feeding supplement  237 mL Oral Q24H   insulin aspart  0-15 Units Subcutaneous TID WC   insulin aspart  0-5 Units Subcutaneous QHS   insulin aspart  3 Units Subcutaneous TID WC   insulin glargine-yfgn  25 Units Subcutaneous QHS   ipratropium-albuterol  3 mL Inhalation TID   mouth rinse  15 mL Mouth Rinse BID   methylPREDNISolone (SOLU-MEDROL) injection  40 mg Intravenous Daily   multivitamin with minerals  1 tablet Oral Daily   oxybutynin  10 mg Oral Daily   pantoprazole  40 mg Oral Daily   simvastatin  20 mg Oral q1800   sodium chloride flush  3 mL Intravenous Q12H   tamsulosin  0.4 mg Oral BID   Continuous Infusions:  heparin 1,500 Units/hr (07/31/21 0813)     LOS: 7 days   Time spent= 35 mins    Averleigh Savary Arsenio Loader, MD [facility]  If 7PM-7AM, please contact night-coverage  07/31/2021, 11:25 AM

## 2021-07-31 NOTE — Plan of Care (Signed)

## 2021-07-31 NOTE — Progress Notes (Signed)
NAME:  Jacob Bishop, MRN:  [id number], DOB:  01-Feb-1941, LOS: 7 ADMISSION DATE:  07/24/2021, CONSULTATION DATE:  12/5 REFERRING MD:  Reesa Chew, CHIEF COMPLAINT:  Pulmonary nodules   History of Present Illness:  80 y/o male with multiple medical problems found to have radiographic findings worrisome for metastatic malignancy when he was admitted for COVID 19.  He was re-admitted for hypoxemia in the setting of pulmonary embolism.  PCCM consulted for consideration of biopsy of his pulmonary nodules.   Pertinent  Medical History  AI Arthritis CAD DM2 Diverticulosis Hyperlipidemia Hypertension OSA Pulmonary hypertension   Significant Hospital Events: Including procedures, antibiotic start and stop dates in addition to other pertinent events   11/30 presented with dyspnea, admit to hospitalists, found to have segmental, acute PE; CT chest with multiple pulmonary nodules throughout lungs, lesions in liver  Interim History / Subjective:  Feels about the same Says it's hard to breathe due to nasal congestion  Objective   Blood pressure 116/64, pulse 73, temperature 98 F (36.7 C), temperature source Axillary, resp. rate 18, height 5\' 11"  (1.803 m), weight 110 kg, SpO2 94 %.        Intake/Output Summary (Last 24 hours) at 07/31/2021 1023 Last data filed at 07/31/2021 0600 Gross per 24 hour  Intake 410.02 ml  Output 750 ml  Net -339.98 ml   Filed Weights   07/25/21 1500 07/28/21 0414  Weight: 110 kg 110 kg    Examination:  General:  Resting comfortably in bed HENT: NCAT OP clear PULM: Diminished bases B, normal effort CV: RRR, no mgr GI: BS+, soft, nontender MSK: normal bulk and tone Neuro: awake, alert, no distress, MAEW   Resolved Hospital Problem list     Assessment & Plan:  Acute pulmonary embolism Multiple pulmonary nodules/masses Acute hypoxemic respiratory failure due to pulmonary masses and pulmonary embolism Baseline pulmonary  hypertension OSA Obesity Physical deconditioning Sinus congestion  Discussion: He remains to unstable to tolerate a bronchoscopy with biopsy.  He tells me today he doesn't think he wants one anyway because he doesn't know what treatment would look like.  Plan: Discussed with primary team, input from palliative medicine and oncology would be helpful here because we are considering a high risk biopsy (liver or lung) and the patient isn't sure if he wants any form of treatment.  Further it's not clear to me that in his current functional status he is a treatment candidate. Would wean off oxygen for O2 saturation > 88% Out of bed, encourage ambulation Heparin per pharmacy  PCCM will sign off.  If his oxygenation improves or if he decides he wants to consider a bronchoscopy we can come back and reassess him for that.  Best Practice (right click and "Reselect all SmartList Selections" daily)   Per [facility]  Labs   CBC: Recent Labs  Lab 07/24/21 1800 07/25/21 0432 07/26/21 0322 07/27/21 0326 07/29/21 0409 07/30/21 0010 07/31/21 0406  WBC 7.0   < > 7.7 6.5 7.0 6.4 5.8  NEUTROABS 5.5  --  5.3 4.5  --   --   --   HGB 13.2   < > 11.5* 11.0* 11.2* 11.1* 11.5*  HCT 39.7   < > 34.9* 33.0* 33.4* 33.7* 34.7*  MCV 94.3   < > 93.3 92.4 92.3 91.8 92.8  PLT 118*   < > 112* 111* 116* 108* 115*   < > = values in this interval not displayed.    Basic Metabolic Panel: Recent Labs  Lab 07/26/21 0322 07/27/21 0326 07/28/21 0315 07/30/21 0010 07/31/21 0406  NA 135 132* 131* 133* 134*  K 4.0 3.6 3.9 3.9 3.8  CL 96* 96* 94* 93* 94*  CO2 32 32 33* 34* 33*  GLUCOSE 112* 100* 119* 208* 109*  BUN 13 15 14 20 22   CREATININE 0.84 0.75 0.80 0.87 0.75  CALCIUM 7.9* 7.7* 7.6* 7.9* 8.0*  MG 2.2 2.3 2.4 2.4 2.5*  PHOS 3.4 3.7 3.4  --   --    GFR: Estimated Creatinine Clearance: 97.6 mL/min (by C-G formula based on SCr of 0.75 mg/dL). Recent Labs  Lab 07/24/21 1800 07/25/21 0432 07/27/21 0326  07/29/21 0409 07/30/21 0010 07/31/21 0406  PROCALCITON  --   --   --  0.21  --   --   WBC 7.0   < > 6.5 7.0 6.4 5.8  LATICACIDVEN 1.4  --   --   --   --   --    < > = values in this interval not displayed.    Liver Function Tests: Recent Labs  Lab 07/24/21 1800 07/25/21 0432 07/26/21 0322 07/27/21 0326 07/28/21 0315  AST 24 22 21 17 18   ALT 30 23 23 21 19   ALKPHOS 206* 184* 190* 180* 169*  BILITOT 1.5* 1.1 1.2 1.0 1.0  PROT 7.3 6.1* 6.0* 5.6* 5.8*  ALBUMIN 3.1* 2.5* 2.5* 2.4* 2.4*   No results for input(s): LIPASE, AMYLASE in the last 168 hours. No results for input(s): AMMONIA in the last 168 hours.  ABG    Component Value Date/Time   HCO3 33.5 (H) 07/24/2021 1800   O2SAT 31.8 07/24/2021 1800     Coagulation Profile: Recent Labs  Lab 07/24/21 2050  INR 1.2    Cardiac Enzymes: No results for input(s): CKTOTAL, CKMB, CKMBINDEX, TROPONINI in the last 168 hours.  HbA1C: HbA1c, POC (controlled diabetic range)  Date/Time Value Ref Range Status  07/13/2018 03:19 PM 6.0 0.0 - 7.0 % Final   Hgb A1c MFr Bld  Date/Time Value Ref Range Status  07/01/2021 04:42 AM 6.2 (H) 4.8 - 5.6 % Final    Comment:    (NOTE) Pre diabetes:          5.7%-6.4%  Diabetes:              >6.4%  Glycemic control for   <7.0% adults with diabetes   06/30/2021 02:50 AM 6.0 (H) 4.8 - 5.6 % Final    Comment:    (NOTE) Pre diabetes:          5.7%-6.4%  Diabetes:              >6.4%  Glycemic control for   <7.0% adults with diabetes     CBG: Recent Labs  Lab 07/30/21 0740 07/30/21 1112 07/30/21 1618 07/30/21 2117 07/31/21 0737  GLUCAP 116* 169* 257* 148* 102*    Critical care time: n/a     Roselie Awkward, MD [place] Pager: [phone] Cell: [phone] After 7:00 pm call Elink  [phone]

## 2021-08-01 DIAGNOSIS — I2699 Other pulmonary embolism without acute cor pulmonale: Secondary | ICD-10-CM | POA: Diagnosis not present

## 2021-08-01 DIAGNOSIS — Z515 Encounter for palliative care: Secondary | ICD-10-CM | POA: Diagnosis not present

## 2021-08-01 DIAGNOSIS — I2510 Atherosclerotic heart disease of native coronary artery without angina pectoris: Secondary | ICD-10-CM | POA: Diagnosis not present

## 2021-08-01 DIAGNOSIS — R079 Chest pain, unspecified: Secondary | ICD-10-CM | POA: Diagnosis not present

## 2021-08-01 DIAGNOSIS — J9621 Acute and chronic respiratory failure with hypoxia: Secondary | ICD-10-CM | POA: Diagnosis not present

## 2021-08-01 LAB — CBC
HCT: 35.7 % — ABNORMAL LOW (ref 39.0–52.0)
Hemoglobin: 11.8 g/dL — ABNORMAL LOW (ref 13.0–17.0)
MCH: 30.6 pg (ref 26.0–34.0)
MCHC: 33.1 g/dL (ref 30.0–36.0)
MCV: 92.5 fL (ref 80.0–100.0)
Platelets: 104 10*3/uL — ABNORMAL LOW (ref 150–400)
RBC: 3.86 MIL/uL — ABNORMAL LOW (ref 4.22–5.81)
RDW: 13.6 % (ref 11.5–15.5)
WBC: 5.8 10*3/uL (ref 4.0–10.5)
nRBC: 0 % (ref 0.0–0.2)

## 2021-08-01 LAB — BASIC METABOLIC PANEL
Anion gap: 4 — ABNORMAL LOW (ref 5–15)
BUN: 18 mg/dL (ref 8–23)
CO2: 36 mmol/L — ABNORMAL HIGH (ref 22–32)
Calcium: 8 mg/dL — ABNORMAL LOW (ref 8.9–10.3)
Chloride: 95 mmol/L — ABNORMAL LOW (ref 98–111)
Creatinine, Ser: 0.86 mg/dL (ref 0.61–1.24)
GFR, Estimated: >60 mL/min (ref >60)
Glucose, Bld: 131 mg/dL — ABNORMAL HIGH (ref 70–99)
Potassium: 4.4 mmol/L (ref 3.5–5.1)
Sodium: 135 mmol/L (ref 135–145)

## 2021-08-01 LAB — GLUCOSE, CAPILLARY
Glucose-Capillary: 104 mg/dL — ABNORMAL HIGH (ref 70–99)
Glucose-Capillary: 145 mg/dL — ABNORMAL HIGH (ref 70–99)
Glucose-Capillary: 172 mg/dL — ABNORMAL HIGH (ref 70–99)
Glucose-Capillary: 182 mg/dL — ABNORMAL HIGH (ref 70–99)

## 2021-08-01 LAB — HEPARIN LEVEL (UNFRACTIONATED): Heparin Unfractionated: 0.81 IU/mL — ABNORMAL HIGH (ref 0.30–0.70)

## 2021-08-01 LAB — MAGNESIUM: Magnesium: 2.4 mg/dL (ref 1.7–2.4)

## 2021-08-01 MED ORDER — ENOXAPARIN SODIUM 120 MG/0.8ML IJ SOSY
110.0000 mg | PREFILLED_SYRINGE | Freq: Two times a day (BID) | INTRAMUSCULAR | Status: DC
  Administered 2021-08-01 – 2021-08-10 (×17): 110 mg via SUBCUTANEOUS
  Filled 2021-08-01 (×20): qty 0.74

## 2021-08-01 MED ORDER — ENOXAPARIN SODIUM 100 MG/ML IJ SOSY: 90.0000 mg | PREFILLED_SYRINGE | Freq: Two times a day (BID) | INTRAMUSCULAR | Status: DC

## 2021-08-01 MED ORDER — SENNOSIDES-DOCUSATE SODIUM 8.6-50 MG PO TABS
2.0000 | ORAL_TABLET | Freq: Two times a day (BID) | ORAL | Status: DC | PRN
  Administered 2021-08-01 – 2021-08-17 (×7): 2 via ORAL
  Filled 2021-08-01 (×8): qty 2

## 2021-08-01 NOTE — Progress Notes (Signed)
Pt continues to refuse CPAP.  Pt encouraged to contact RT should he change his mind.

## 2021-08-01 NOTE — Progress Notes (Signed)
PROGRESS NOTE    Jacob Bishop  ASN:[id number] DOB: [birth year] DOA: 07/24/2021 PCP: [name], NP   Brief Narrative:  80 year old with chronic hypoxic respiratory failure with COVID-19 infection, CAD, OSA, DM2, HTN, thrombocytopenia, liver and lung lesion concerning for metastatic disease admitted for shortness of breath found to have acute PE.  Due to metastatic lesion, IR consulted for biopsy.  Unable to perform liver biopsy as it was thought it could be hemangioma.  Patient needs lung biopsy once breathing is stabilized.  Seen by pulmonary.  Palliative care team consulted.   Assessment & Plan:   Principal Problem:   Acute pulmonary embolus (HCC) Active Problems:   Obstructive sleep apnea   Essential hypertension   CAD, NATIVE VESSEL   Thrombocytopenia (HCC)   Mass of lung   Lesion of liver   Acute on chronic respiratory failure with hypoxia (HCC)   DM type 2 with diabetic mixed hyperlipidemia (HCC)   Depression   Pressure injury of skin   Chest pain  Acute respiratory distress with hypoxia - Combination of abnormal breath sounds and pulmonary embolism.  Still remains on 9 L.  Very poor respiratory effort on I-S/flutter.  Acute pulmonary embolism, subsegmental - No further biopsy planned as of now therefore will transition patient to Lovenox 1 mg/kg every 12 hours - Echocardiogram EF normal. - Lower extremity ultrasound showed right lower extremity DVT  Bilateral diffuse abnormal breath sounds - Respiratory status remains poor.  Chest x-ray shows poor aeration not much change from previous x-rays.  Continue bronchodilators scheduled and as needed.  Solu-Medrol IV daily.  Recent BNP and procalcitonin were negative but I will repeated - Out of bed to chair.  Aggressive use of I-S/flutter.  Very poor efforts despite of encouragement.  COVID-19 infection - 06/29/21. Now out of isolation. Repeat test here is negative. Procal 0.21  Metastatic lesions in the lung and liver -  Unknown primary; Unable to obtain liver biopsy as its thought to be hemangioma.  Eventually patient will need CT with liver protocol.  Palliative care team is following.  Patient is very high risk for bronchoscopy at this time per pulmonary.  Patient is not sure if he would like this lung nodule to be evaluated at this time therefore would like to hold off on oncology evaluation.  Diabetes mellitus type 2 - On Semglee 25 units at bedtime.  Sliding scale and Accu-Cheks  Thrombocytopenia -No evidence of bleeding.  Continue to monitor  Depression - Wellbutrin  Hyperlipidemia - Zocor  Obstructive sleep apnea -Bedtime CPAP as needed  I have discussed risk and benefits of NOAC and Coumadin therapy.  Patient and family prefers NOAC eventually.  In the meantime palliative care team consulted to help establish goals of care.  Overall patient remains ill requiring high amounts of oxygen.  Poor respiratory efforts.  DVT prophylaxis: Lovenox  Code Status: Full code Family Communication: Patient's wife has been updated  Status is: Inpatient  Remains inpatient appropriate because: Still has significant amount of abnormal breath sounds.  He is not safe for discharge.       Nutritional status  Nutrition Problem: Increased nutrient needs Etiology: acute illness, chronic illness  Signs/Symptoms: estimated needs  Interventions: Ensure Enlive (each supplement provides 350kcal and 20 grams of protein), Prostat, MVI  Body mass index is 33.82 kg/m.  Pressure Injury 07/25/21 Buttocks Right Stage 2 -  Partial thickness loss of dermis presenting as a shallow open injury with a red, pink wound bed without slough. (  Active)  07/25/21 1530  Location: Buttocks  Location Orientation: Right  Staging: Stage 2 -  Partial thickness loss of dermis presenting as a shallow open injury with a red, pink wound bed without slough.  Wound Description (Comments):   Present on Admission: Yes     Pressure  Injury 07/25/21 Buttocks Left Stage 2 -  Partial thickness loss of dermis presenting as a shallow open injury with a red, pink wound bed without slough. (Active)  07/25/21 1531  Location: Buttocks  Location Orientation: Left  Staging: Stage 2 -  Partial thickness loss of dermis presenting as a shallow open injury with a red, pink wound bed without slough.  Wound Description (Comments):   Present on Admission: Yes          Subjective: seen and examined at bedside, still has shallow breathing.  Tells me he gets short of breath with minimal exertion.  Very poor effort with incentive spirometer and flutter valve.  Examination: Constitutional: Not in acute distress.  9 L high flow.  Chronically ill. Respiratory: Shallow breathing, bilateral diffuse rhonchi Cardiovascular: Normal sinus rhythm, no rubs Abdomen: Nontender nondistended good bowel sounds Musculoskeletal: No edema noted Skin: No rashes seen Neurologic: CN 2-12 grossly intact.  And nonfocal Psychiatric: Normal judgment and insight. Alert and oriented x 3. Normal mood.  Objective: Vitals:   07/31/21 2105 08/01/21 0504 08/01/21 0906 08/01/21 1247  BP: 130/75 120/67  (!) 155/47  Pulse: 76 85  89  Resp: 18 17  20   Temp: 97.7 F (36.5 C) (!) 97.5 F (36.4 C)  98.7 F (37.1 C)  TempSrc: Oral Oral  Oral  SpO2: 93% 93% 92% 90%  Weight:      Height:        Intake/Output Summary (Last 24 hours) at 08/01/2021 1314 Last data filed at 08/01/2021 0900 Gross per 24 hour  Intake 895.26 ml  Output 1450 ml  Net -554.74 ml   Filed Weights   07/25/21 1500 07/28/21 0414  Weight: 110 kg 110 kg     Data Reviewed:   CBC: Recent Labs  Lab 07/26/21 0322 07/27/21 0326 07/29/21 0409 07/30/21 0010 07/31/21 0406 08/01/21 0011  WBC 7.7 6.5 7.0 6.4 5.8 5.8  NEUTROABS 5.3 4.5  --   --   --   --   HGB 11.5* 11.0* 11.2* 11.1* 11.5* 11.8*  HCT 34.9* 33.0* 33.4* 33.7* 34.7* 35.7*  MCV 93.3 92.4 92.3 91.8 92.8 92.5  PLT 112*  111* 116* 108* 115* 607*   Basic Metabolic Panel: Recent Labs  Lab 07/26/21 0322 07/27/21 0326 07/28/21 0315 07/30/21 0010 07/31/21 0406 08/01/21 0011  NA 135 132* 131* 133* 134* 135  K 4.0 3.6 3.9 3.9 3.8 4.4  CL 96* 96* 94* 93* 94* 95*  CO2 32 32 33* 34* 33* 36*  GLUCOSE 112* 100* 119* 208* 109* 131*  BUN 13 15 14 20 22 18   CREATININE 0.84 0.75 0.80 0.87 0.75 0.86  CALCIUM 7.9* 7.7* 7.6* 7.9* 8.0* 8.0*  MG 2.2 2.3 2.4 2.4 2.5* 2.4  PHOS 3.4 3.7 3.4  --   --   --    GFR: Estimated Creatinine Clearance: 89.3 mL/min (by C-G formula based on SCr of 0.86 mg/dL). Liver Function Tests: Recent Labs  Lab 07/26/21 0322 07/27/21 0326 07/28/21 0315  AST 21 17 18   ALT 23 21 19   ALKPHOS 190* 180* 169*  BILITOT 1.2 1.0 1.0  PROT 6.0* 5.6* 5.8*  ALBUMIN 2.5* 2.4* 2.4*   No results  for input(s): LIPASE, AMYLASE in the last 168 hours. No results for input(s): AMMONIA in the last 168 hours. Coagulation Profile: No results for input(s): INR, PROTIME in the last 168 hours.  Cardiac Enzymes: No results for input(s): CKTOTAL, CKMB, CKMBINDEX, TROPONINI in the last 168 hours. BNP (last 3 results) No results for input(s): PROBNP in the last 8760 hours. HbA1C: No results for input(s): HGBA1C in the last 72 hours. CBG: Recent Labs  Lab 07/31/21 1147 07/31/21 1649 07/31/21 2103 08/01/21 0751 08/01/21 1117  GLUCAP 150* 171* 167* 104* 145*   Lipid Profile: No results for input(s): CHOL, HDL, LDLCALC, TRIG, CHOLHDL, LDLDIRECT in the last 72 hours. Thyroid Function Tests: No results for input(s): TSH, T4TOTAL, FREET4, T3FREE, THYROIDAB in the last 72 hours. Anemia Panel: No results for input(s): VITAMINB12, FOLATE, FERRITIN, TIBC, IRON, RETICCTPCT in the last 72 hours. Sepsis Labs: Recent Labs  Lab 07/29/21 0409  PROCALCITON 0.21    Recent Results (from the past 240 hour(s))  Resp Panel by RT-PCR (Flu A&B, Covid) Nasopharyngeal Swab     Status: None   Collection Time:  07/24/21  6:00 PM   Specimen: Nasopharyngeal Swab; Nasopharyngeal(NP) swabs in vial transport medium  Result Value Ref Range Status   SARS Coronavirus 2 by RT PCR NEGATIVE NEGATIVE Final    Comment: (NOTE) SARS-CoV-2 target nucleic acids are NOT DETECTED.  The SARS-CoV-2 RNA is generally detectable in upper respiratory specimens during the acute phase of infection. The lowest concentration of SARS-CoV-2 viral copies this assay can detect is 138 copies/mL. A negative result does not preclude SARS-Cov-2 infection and should not be used as the sole basis for treatment or other patient management decisions. A negative result may occur with  improper specimen collection/handling, submission of specimen other than nasopharyngeal swab, presence of viral mutation(s) within the areas targeted by this assay, and inadequate number of viral copies(<138 copies/mL). A negative result must be combined with clinical observations, patient history, and epidemiological information. The expected result is Negative.  Fact Sheet for Patients:  EntrepreneurPulse.com.au  Fact Sheet for Healthcare Providers:  IncredibleEmployment.be  This test is no t yet approved or cleared by the [place] FDA and  has been authorized for detection and/or diagnosis of SARS-CoV-2 by FDA under an Emergency Use Authorization (EUA). This EUA will remain  in effect (meaning this test can be used) for the duration of the COVID-19 declaration under Section 564(b)(1) of the Act, 21 U.S.C.section 360bbb-3(b)(1), unless the authorization is terminated  or revoked sooner.       Influenza A by PCR NEGATIVE NEGATIVE Final   Influenza B by PCR NEGATIVE NEGATIVE Final    Comment: (NOTE) The Xpert Xpress SARS-CoV-2/FLU/RSV plus assay is intended as an aid in the diagnosis of influenza from Nasopharyngeal swab specimens and should not be used as a sole basis for treatment. Nasal washings  and aspirates are unacceptable for Xpert Xpress SARS-CoV-2/FLU/RSV testing.  Fact Sheet for Patients: EntrepreneurPulse.com.au  Fact Sheet for Healthcare Providers: IncredibleEmployment.be  This test is not yet approved or cleared by the [place] FDA and has been authorized for detection and/or diagnosis of SARS-CoV-2 by FDA under an Emergency Use Authorization (EUA). This EUA will remain in effect (meaning this test can be used) for the duration of the COVID-19 declaration under Section 564(b)(1) of the Act, 21 U.S.C. section 360bbb-3(b)(1), unless the authorization is terminated or revoked.  Performed at [facility], [place] [address], [place], [place] [address]   Blood culture (routine  x 2)     Status: None   Collection Time: 07/24/21  6:00 PM   Specimen: BLOOD  Result Value Ref Range Status   Specimen Description   Final    BLOOD RIGHT ANTECUBITAL Performed at [facility] [address] [place], [place] [address]    Special Requests   Final    BOTTLES DRAWN AEROBIC AND ANAEROBIC Blood Culture adequate volume Performed at [facility] [address] [place], [place] [address]    Culture   Final    NO GROWTH 5 DAYS Performed at [facility], [place] [address] [place], [place] [address]    Report Status 07/30/2021 FINAL  Final  Blood culture (routine x 2)     Status: None   Collection Time: 07/24/21  6:30 PM   Specimen: BLOOD  Result Value Ref Range Status   Specimen Description   Final    BLOOD LEFT ANTECUBITAL Performed at [facility] [address] [place], [place] [address]    Special Requests   Final    BOTTLES DRAWN AEROBIC AND ANAEROBIC Blood Culture adequate volume Performed at [facility] [address], [place], [place] [address]    Culture   Final    NO GROWTH 5 DAYS Performed at [facility], [place] [address] [place], [place] [address]    Report Status 07/30/2021 FINAL  Final  MRSA Next Gen by PCR, Nasal     Status: None   Collection Time: 07/25/21  2:59 PM   Specimen: Nasal Mucosa; Nasal Swab  Result Value Ref Range Status   MRSA by PCR Next Gen NOT DETECTED NOT DETECTED Final    Comment: (NOTE) The GeneXpert MRSA Assay (FDA approved for NASAL specimens only), is one component of a comprehensive MRSA colonization surveillance program. It is not intended to diagnose MRSA infection nor to guide or monitor treatment for MRSA infections. Test performance is not FDA approved in patients less than 64 years old. Performed at [facility], [place] [address], [place], [place] [address]          Radiology Studies: DG Chest Port 1 View  Result Date: 07/31/2021 CLINICAL DATA:  Shortness of breath EXAM: PORTABLE CHEST 1 VIEW COMPARISON:  Previous studies including the examination of 07/27/2021 FINDINGS: Transverse diameter of heart is increased. There is poor inspiration. There are patchy infiltrates and nodular densities in both lungs. There is possible worsening of infiltrate in the left lower lung fields. There is blunting of lateral CP angles. There is no pneumothorax. IMPRESSION: There are patchy infiltrates and discrete nodules in both lungs. Poor inspiration. There is possible interval worsening of infiltrate in the left lower lung fields. Possible small bilateral pleural effusions. Electronically Signed   By: Elmer Picker M.D.   On: 07/31/2021 11:53        Scheduled Meds:  (feeding supplement) PROSource Plus  30 mL Oral Daily   budesonide (PULMICORT) nebulizer solution  0.5 mg Nebulization BID   buPROPion  150 mg Oral Daily   Chlorhexidine Gluconate Cloth  6 each Topical Daily   enoxaparin (LOVENOX) injection  110 mg Subcutaneous Q12H   feeding supplement  237 mL Oral Q24H   fluticasone  2 spray Each Nare Daily   insulin aspart  0-15 Units Subcutaneous TID  WC   insulin aspart  0-5 Units Subcutaneous QHS   insulin aspart  3 Units Subcutaneous TID WC   insulin glargine-yfgn  25 Units Subcutaneous QHS   ipratropium-albuterol  3 mL Inhalation TID   loratadine  10 mg Oral Daily   mouth rinse  15 mL Mouth Rinse BID   methylPREDNISolone (SOLU-MEDROL) injection  40 mg Intravenous Daily   multivitamin with minerals  1 tablet Oral Daily   oxybutynin  10 mg Oral Daily   pantoprazole  40 mg Oral Daily   simvastatin  20 mg Oral q1800   sodium chloride flush  3 mL Intravenous Q12H   tamsulosin  0.4 mg Oral BID   Continuous Infusions:     LOS: 8 days   Time spent= 35 mins    Mesa Janus Arsenio Loader, MD [facility]  If 7PM-7AM, please contact night-coverage  08/01/2021, 1:14 PM

## 2021-08-01 NOTE — Progress Notes (Signed)
ANTICOAGULATION CONSULT NOTE - follow up  Pharmacy Consult for Heparin Indication: pulmonary embolus, DVT's in R leg  Allergies  Allergen Reactions   Lipitor [Atorvastatin Calcium] Other (See Comments)    Muscle soreness   Oxycodone Anxiety    Causes patient to become shaky and dizzy. Unable to tolerate.   Patient Measurements: Height: 5\' 11"  (180.3 cm) Weight: 110 kg (242 lb 8.1 oz) IBW/kg (Calculated) : 75.3 Heparin Dosing Weight: 99 kg  Vital Signs: Temp: 97.7 F (36.5 C) (12/07 2105) Temp Source: Oral (12/07 2105) BP: 130/75 (12/07 2105) Pulse Rate: 76 (12/07 2105)  Labs: Recent Labs    07/30/21 0010 07/31/21 0406 07/31/21 1442 08/01/21 0011  HGB 11.1* 11.5*  --  11.8*  HCT 33.7* 34.7*  --  35.7*  PLT 108* 115*  --  104*  HEPARINUNFRC 0.46 1.10* 0.87* 0.81*  CREATININE 0.87 0.75  --  0.86    Estimated Creatinine Clearance: 89.3 mL/min (by C-G formula based on SCr of 0.86 mg/dL).  Medical History: Past Medical History:  Diagnosis Date   AI (aortic insufficiency) 01/11/2018   Trace, noted on ECHO   Arthritis    CAD in native artery    Nuclear stress test 10/19: EF 58, normal perfusion, low risk   Cellulitis    Dermatitis    Diabetes mellitus    type II   Diastolic dysfunction 02/58/5277   Mild, noted on ECHO   Diverticulosis 01/31/2016   ASCENDING COLON AND CECUM, NOTED ON COLONOSCOPY   DOE (dyspnea on exertion)    Edema    Fatty liver 09/18/2004   Noted on [place] ABD   History of kidney stones 05/23/2002   Noted on CT Abd   Hx of colonic polyps 01/31/2016   Hyperlipidemia    Hypertension    Internal hemorrhoids 01/31/2016   Noted on colonoscopy   Obesity    OSA (obstructive sleep apnea)    cpap   Positional vertigo    Pulmonary hypertension ([place]) 01/11/2018   Mild, noted on ECHO   Rhinosinusitis    Skin lesion    TR (tricuspid regurgitation) 01/11/2018   Trace, noted on ECHO    Medications:  Infusions:   Assessment: 52 yoM with PMH of  recent Covid pneumonia (admitted 11/6-11/8), OSA, CAD, DM, HTN presents to ED with SOB.  CT angio with pulmonary embolus.  Preliminary lower extremity dopplers with acute DVT in R mid and distal femoral vein, R popliteal vein, R posterior tibial veins, and R peroneal veins. Pharmacy is consulted to dose heparin for PE.   No prior anticoagulation Baseline coags: aPTT 27 seconds, INR 1.2 Baseline CBC:  Hgb 13.2, Plt 118 (chronic thrombocytopenia)  Today, 08/01/21 -14:42 on 12/7 HL 0.87 (supratherapeutic) with heparin gtt @ 1500 units/hr earlier today.  Rate decreased to 1350 units/hr. - 00:11 HL 0.81 (supratherapeutic) with heparin gtt currently at 1350 units/hr   - No complications of therapy noted -CBC:  Hgb stable 11.8; PLTC 104K - SCr = 0.86  Goal of Therapy:  Heparin level 0.3-0.7 units/ml Monitor platelets by anticoagulation protocol: Yes   Plan:  Decrease heparin gtt to 1200 units/hr  Check HL 8 hr after heparin rate decreased Monitor HL and CBC daily Follow-up plan for further procedures and ability to resume oral anticoagulation  Leone Haven, PharmD 08/01/2021 1:28 AM

## 2021-08-01 NOTE — Consult Note (Signed)
Consultation Note Date: 08/01/2021   Patient Name: Jacob Bishop  DOB: 1941-07-06  MRN: [id number]  Age / Sex: 80 y.o., male  PCP: [name], NP Referring Physician: Damita Lack, MD  Reason for Consultation: Establishing goals of care  HPI/Patient Profile: 80 y.o. male  admitted on 07/24/2021    Clinical Assessment and Goals of Care: 80 year old gentleman who lives at home with his wife, recent history of COVID-19 infection, underlying history of coronary artery disease obstructive sleep apnea diabetes hypertension admitted with liver and lung lesions concerning for metastatic disease, admitted with shortness of breath, found to have acute pulmonary embolism.  Admitted to hospital medicine service, pulmonary medicine colleagues following, interventional radiology also consulted for biopsy and the diagnosis.  Palliative medicine consultation for goals of care discussions and additional support has been requested. Patient is awake alert sitting up in bed.  He is attempting to use his incentive spirometry device.  He is in no distress however does become dyspneic when he is speaking for a long time with me. Patient is aware of the serious nature of this current hospitalization thus far.  He is knows he has blood clots in his lungs.  We talked about scope of current hospitalization and findings thus far. Patient states he remains hopeful for ongoing stabilization/recovery.  He endorses full code/full scope.  He has not prepared any advance care planning documents in the past.  Discussed extensively about full code versus DO NOT RESUSCITATE.  Patient states his wishes for continuation of full CODE STATUS.  NEXT OF KIN Patient is married, lives at home with his wife, has a daughter who lives in [place].  SUMMARY OF RECOMMENDATIONS   Goals of care discussions undertaken with patient, he at present  endorses his wishes for continuation of  Full Code, Full Scope status.  Continue current mode of care PMT to follow.   Code Status/Advance Care Planning: Full code   Symptom Management:     Palliative Prophylaxis:  Delirium Protocol  Additional Recommendations (Limitations, Scope, Preferences): Full Scope Treatment  Psycho-social/Spiritual:  Desire for further Chaplaincy support:yes Additional Recommendations: Caregiving  Support/Resources  Prognosis:  Unable to determine  Discharge Planning: To Be Determined      Primary Diagnoses: Present on Admission:  Acute on chronic respiratory failure with hypoxia (HCC)  CAD, NATIVE VESSEL  DM type 2 with diabetic mixed hyperlipidemia ([place])  Essential hypertension  Obstructive sleep apnea   I have reviewed the medical record, interviewed the patient and family, and examined the patient. The following aspects are pertinent.  Past Medical History:  Diagnosis Date   AI (aortic insufficiency) 01/11/2018   Trace, noted on ECHO   Arthritis    CAD in native artery    Nuclear stress test 10/19: EF 58, normal perfusion, low risk   Cellulitis    Dermatitis    Diabetes mellitus    type II   Diastolic dysfunction 96/78/9381   Mild, noted on ECHO   Diverticulosis 01/31/2016   ASCENDING COLON AND CECUM, NOTED  ON COLONOSCOPY   DOE (dyspnea on exertion)    Edema    Fatty liver 09/18/2004   Noted on [place] ABD   History of kidney stones 05/23/2002   Noted on CT Abd   Hx of colonic polyps 01/31/2016   Hyperlipidemia    Hypertension    Internal hemorrhoids 01/31/2016   Noted on colonoscopy   Obesity    OSA (obstructive sleep apnea)    cpap   Positional vertigo    Pulmonary hypertension (HCC) 01/11/2018   Mild, noted on ECHO   Rhinosinusitis    Skin lesion    TR (tricuspid regurgitation) 01/11/2018   Trace, noted on ECHO   Social History   Socioeconomic History   Marital status: Married    Spouse name: Not on file    Number of children: Not on file   Years of education: Not on file   Highest education level: Not on file  Occupational History   Occupation: OWNER, Health and safety inspector: [employer]  Tobacco Use   Smoking status: Former    Packs/day: 1.00    Years: 20.00    Pack years: 20.00    Types: Cigarettes    Quit date: 01/10/1996    Years since quitting: 25.5   Smokeless tobacco: Never  Vaping Use   Vaping Use: Never used  Substance and Sexual Activity   Alcohol use: No   Drug use: No   Sexual activity: Not on file  Other Topics Concern   Not on file  Social History Narrative   Grew up in [place]. Mother was [place], Father [place].   He works daily - owns a [employer]    Married for 30+ years   Has a daughter who lives in [place] Strain: Not on [employer] Insecurity: No Food Insecurity   Worried About Charity fundraiser in the Last Year: Never true   Arboriculturist in the Last Year: Never true  Transportation Needs: Not on file  Physical Activity: Inactive   Days of Exercise per Week: 0 days   Minutes of Exercise per Session: 0 min  Stress: No Stress Concern Present   Feeling of Stress : Not at all  Social Connections: Moderately Isolated   Frequency of Communication with Friends and Family: Twice a week   Frequency of Social Gatherings with Friends and Family: Twice a week   Attends Religious Services: Never   Marine scientist or Organizations: No   Attends Music therapist: Never   Marital Status: Married   Family History  Problem Relation Age of Onset   Cancer Mother        ? lung cancer   Heart disease Father    Heart attack Father    Heart disease Sister    Diabetes Sister    Heart attack Sister    Heart attack Brother    Scheduled Meds:  (feeding supplement) PROSource Plus  30 mL Oral Daily   budesonide (PULMICORT) nebulizer solution  0.5 mg  Nebulization BID   buPROPion  150 mg Oral Daily   Chlorhexidine Gluconate Cloth  6 each Topical Daily   enoxaparin (LOVENOX) injection  110 mg Subcutaneous Q12H   feeding supplement  237 mL Oral Q24H   fluticasone  2 spray Each Nare Daily   insulin aspart  0-15 Units Subcutaneous TID WC   insulin aspart  0-5 Units Subcutaneous QHS   insulin aspart  3 Units Subcutaneous TID WC   insulin glargine-yfgn  25 Units Subcutaneous QHS   ipratropium-albuterol  3 mL Inhalation TID   loratadine  10 mg Oral Daily   mouth rinse  15 mL Mouth Rinse BID   methylPREDNISolone (SOLU-MEDROL) injection  40 mg Intravenous Daily   multivitamin with minerals  1 tablet Oral Daily   oxybutynin  10 mg Oral Daily   pantoprazole  40 mg Oral Daily   simvastatin  20 mg Oral q1800   sodium chloride flush  3 mL Intravenous Q12H   tamsulosin  0.4 mg Oral BID   Continuous Infusions: PRN Meds:.acetaminophen **OR** acetaminophen, alum & mag hydroxide-simeth, calcium carbonate, guaiFENesin-dextromethorphan, ipratropium-albuterol, lip balm, polyethylene glycol, senna-docusate, sodium chloride Medications Prior to Admission:  Prior to Admission medications   Medication Sig Start Date End Date Taking? Authorizing Provider  albuterol (VENTOLIN HFA) 108 (90 Base) MCG/ACT inhaler TAKE 2 PUFFS BY MOUTH EVERY 6 HOURS AS NEEDED FOR WHEEZE OR SHORTNESS OF BREATH Patient taking differently: 2 puffs every 6 (six) hours as needed for shortness of breath. 07/11/21  Yes Nafziger, Tommi Rumps, NP  buPROPion (WELLBUTRIN XL) 150 MG 24 hr tablet Take 1 tablet (150 mg total) by mouth daily. 06/14/21  Yes Nafziger, Tommi Rumps, NP  Insulin Glargine (BASAGLAR KWIKPEN) 100 UNIT/ML INJECT 0.4 MLS (40 UNITS TOTAL) INTO THE SKIN AT BEDTIME. Patient taking differently: Inject 40 Units into the skin at bedtime. INJECT 0.4 MLS (40 UNITS TOTAL) INTO THE SKIN AT BEDTIME. 09/26/20  Yes Nafziger, Tommi Rumps, NP  meclizine (ANTIVERT) 12.5 MG tablet TAKE ONE TABLET BY MOUTH three  times daily AS NEEDED FOR dizziness Patient taking differently: Take 12.5 mg by mouth 3 (three) times daily as needed. 06/18/21  Yes Nafziger, Tommi Rumps, NP  oxybutynin (DITROPAN-XL) 10 MG 24 hr tablet Take 10 mg by mouth daily. 02/29/20  Yes Ceasar Mons, MD  simvastatin (ZOCOR) 20 MG tablet Take 1 tablet (20 mg total) by mouth daily at 6 PM. Patient taking differently: Take 20 mg by mouth daily. 11/30/20  Yes Nafziger, Tommi Rumps, NP  tamsulosin (FLOMAX) 0.4 MG CAPS capsule Take 0.4 mg by mouth 2 (two) times daily. 03/07/15  Yes Ceasar Mons, MD  Insulin Pen Needle (BD PEN NEEDLE NANO U/F) 32G X 4 MM MISC USE AS DIRECTED TWICE A DAY 05/18/20   [name], NP  Misc. Devices (CAREX COCCYX CUSHION) MISC Use when sitting in chair or couch 07/16/21   Nafziger, Tommi Rumps, NP   Allergies  Allergen Reactions   Lipitor [Atorvastatin Calcium] Other (See Comments)    Muscle soreness   Oxycodone Anxiety    Causes patient to become shaky and dizzy. Unable to tolerate.   Review of Systems +WEAKNESS  Physical Exam Patient is awake alert sitting up in bed, currently on 9 L oxygen high flow via nasal cannula Patient is attempting to use incentive spirometry device that is present at the bedside.  He does become short of breath after speaking for a minute or 2 and is not able to continue conversation further without pausing to rest Has bilateral diffuse rhonchorous breath sounds S1-S2 Awake alert oriented Mood and affect within normal limits  Vital Signs: BP (!) 155/47 (BP Location: Right Arm)   Pulse 89   Temp 98.7 F (37.1 C) (Oral)   Resp 20   Ht 5\' 11"  (1.803 m)   Wt 110 kg   SpO2 90%   BMI 33.82 kg/m  Pain  Scale: 0-10 POSS *See Group Information*: 1-Acceptable,Awake and alert Pain Score: 0-No pain   SpO2: SpO2: 90 % O2 Device:SpO2: 90 % O2 Flow Rate: .O2 Flow Rate (L/min): 8 L/min  IO: Intake/output summary:  Intake/Output Summary (Last 24 hours) at 08/01/2021 1434 Last  data filed at 08/01/2021 0900 Gross per 24 hour  Intake 775.26 ml  Output 650 ml  Net 125.26 ml    LBM: Last BM Date: 07/27/21 Baseline Weight: Weight: 110 kg Most recent weight: Weight: 110 kg     Palliative Assessment/Data:   PPS 50%  Time In:  1400 Time Out:  1500 Time Total:  60    Greater than 50%  of this time was spent counseling and coordinating care related to the above assessment and plan.  Signed by: Loistine Chance, MD   Please contact Palliative Medicine Team phone at [phone] for questions and concerns.  For individual provider: See Shea Evans

## 2021-08-02 DIAGNOSIS — J9621 Acute and chronic respiratory failure with hypoxia: Secondary | ICD-10-CM | POA: Diagnosis not present

## 2021-08-02 DIAGNOSIS — F32A Depression, unspecified: Secondary | ICD-10-CM | POA: Diagnosis not present

## 2021-08-02 DIAGNOSIS — I2699 Other pulmonary embolism without acute cor pulmonale: Secondary | ICD-10-CM | POA: Diagnosis not present

## 2021-08-02 DIAGNOSIS — R918 Other nonspecific abnormal finding of lung field: Secondary | ICD-10-CM | POA: Diagnosis not present

## 2021-08-02 DIAGNOSIS — R079 Chest pain, unspecified: Secondary | ICD-10-CM | POA: Diagnosis not present

## 2021-08-02 DIAGNOSIS — E1169 Type 2 diabetes mellitus with other specified complication: Secondary | ICD-10-CM | POA: Diagnosis not present

## 2021-08-02 LAB — BASIC METABOLIC PANEL
Anion gap: 7 (ref 5–15)
BUN: 20 mg/dL (ref 8–23)
CO2: 35 mmol/L — ABNORMAL HIGH (ref 22–32)
Calcium: 8.3 mg/dL — ABNORMAL LOW (ref 8.9–10.3)
Chloride: 95 mmol/L — ABNORMAL LOW (ref 98–111)
Creatinine, Ser: 0.8 mg/dL (ref 0.61–1.24)
GFR, Estimated: >60 mL/min (ref >60)
Glucose, Bld: 119 mg/dL — ABNORMAL HIGH (ref 70–99)
Potassium: 4.3 mmol/L (ref 3.5–5.1)
Sodium: 137 mmol/L (ref 135–145)

## 2021-08-02 LAB — PROCALCITONIN: Procalcitonin: <0.1 ng/mL

## 2021-08-02 LAB — CBC
HCT: 35.5 % — ABNORMAL LOW (ref 39.0–52.0)
Hemoglobin: 11.7 g/dL — ABNORMAL LOW (ref 13.0–17.0)
MCH: 30.9 pg (ref 26.0–34.0)
MCHC: 33 g/dL (ref 30.0–36.0)
MCV: 93.7 fL (ref 80.0–100.0)
Platelets: 110 10*3/uL — ABNORMAL LOW (ref 150–400)
RBC: 3.79 MIL/uL — ABNORMAL LOW (ref 4.22–5.81)
RDW: 13.8 % (ref 11.5–15.5)
WBC: 8 10*3/uL (ref 4.0–10.5)
nRBC: 0 % (ref 0.0–0.2)

## 2021-08-02 LAB — GLUCOSE, CAPILLARY
Glucose-Capillary: 95 mg/dL (ref 70–99)
Glucose-Capillary: 171 mg/dL — ABNORMAL HIGH (ref 70–99)
Glucose-Capillary: 128 mg/dL — ABNORMAL HIGH (ref 70–99)
Glucose-Capillary: 134 mg/dL — ABNORMAL HIGH (ref 70–99)

## 2021-08-02 LAB — BRAIN NATRIURETIC PEPTIDE: B Natriuretic Peptide: 18.8 pg/mL (ref 0.0–100.0)

## 2021-08-02 LAB — MAGNESIUM: Magnesium: 2.2 mg/dL (ref 1.7–2.4)

## 2021-08-02 MED ORDER — CHLORHEXIDINE GLUCONATE 0.12 % MT SOLN
15.0000 mL | Freq: Two times a day (BID) | OROMUCOSAL | Status: DC
  Administered 2021-08-02 – 2021-08-04 (×4): 15 mL via OROMUCOSAL
  Filled 2021-08-02 (×3): qty 15

## 2021-08-02 NOTE — Progress Notes (Signed)
PROGRESS NOTE    Jacob Bishop  GEX:[id number] DOB: 1940/12/30 DOA: 07/24/2021 PCP: [name], NP   Brief Narrative:  80 year old with chronic hypoxic respiratory failure with COVID-19 infection, CAD, OSA, DM2, HTN, thrombocytopenia, liver and lung lesion concerning for metastatic disease admitted for shortness of breath found to have acute PE.  Due to metastatic lesion, IR consulted for biopsy.  Unable to perform liver biopsy as it was thought it could be hemangioma.  Patient needs lung biopsy once breathing is stabilized.  Seen by pulmonary.  Palliative care team consulted.  Concerns of metastatic spread.   Assessment & Plan:   Principal Problem:   Acute pulmonary embolus (HCC) Active Problems:   Obstructive sleep apnea   Essential hypertension   CAD, NATIVE VESSEL   Thrombocytopenia (HCC)   Mass of lung   Lesion of liver   Acute on chronic respiratory failure with hypoxia (HCC)   DM type 2 with diabetic mixed hyperlipidemia (HCC)   Depression   Pressure injury of skin   Chest pain  Acute respiratory distress with hypoxia Bilateral diffuse rhonchi -Still has significant abnormal breath sounds.  There is concerns of metastatic spread but unknown primary.  Patient is not stable enough to get any kind of biopsy.  Overall he is in poor condition and would not be able to tolerate any therapy. -Continue steroids but I will plan on transitioning to oral in next 24 hours and taper this off - Bronchodilators, I-S/flutter.  Out of bed to chair. - BNP and procalcitonin negative  Acute pulmonary embolism, subsegmental - No further biopsy planned as of now therefore will transition patient to Lovenox 1 mg/kg every 12 hours - Echocardiogram EF normal. - Lower extremity ultrasound showed right lower extremity DVT  COVID-19 infection - 06/29/21. Now out of isolation. Repeat test here is negative. Procal 0.21  Metastatic lesions in the lung and liver -Unknown primary, liver lesions  appears to be hemangioma, I have recommended CT liver protocol when more stable.  In terms of lung biopsy, he is unstable for this.  Seen by pulmonary, concerns of lymphangitic spread.  Overall poor prognosis. Patient is having unrestricted biopsy and any cancer treatment at this time even if it ends up being malignant.  Diabetes mellitus type 2 - On Semglee 25 units at bedtime.  Sliding scale and Accu-Cheks  Thrombocytopenia -No evidence of bleeding.  Continue to monitor  Depression - Wellbutrin  Hyperlipidemia - Zocor  Obstructive sleep apnea -Bedtime CPAP as needed  Over poor respiratory efforts.  Seen by palliative care team.  Will need ongoing discussions with patient and family as he is overall poor prognosis.  DVT prophylaxis: Lovenox  Code Status: Full code Family Communication: Wife has been updated periodically  Status is: Inpatient  Remains inpatient appropriate because: Still has significant amount of abnormal breath sounds.  He is not safe for discharge.       Nutritional status  Nutrition Problem: Increased nutrient needs Etiology: acute illness, chronic illness  Signs/Symptoms: estimated needs  Interventions: Ensure Enlive (each supplement provides 350kcal and 20 grams of protein), Prostat, MVI  Body mass index is 33.82 kg/m.  Pressure Injury 07/25/21 Buttocks Right Stage 2 -  Partial thickness loss of dermis presenting as a shallow open injury with a red, pink wound bed without slough. (Active)  07/25/21 1530  Location: Buttocks  Location Orientation: Right  Staging: Stage 2 -  Partial thickness loss of dermis presenting as a shallow open injury with a red, pink  wound bed without slough.  Wound Description (Comments):   Present on Admission: Yes     Pressure Injury 07/25/21 Buttocks Left Stage 2 -  Partial thickness loss of dermis presenting as a shallow open injury with a red, pink wound bed without slough. (Active)  07/25/21 1531  Location:  Buttocks  Location Orientation: Left  Staging: Stage 2 -  Partial thickness loss of dermis presenting as a shallow open injury with a red, pink wound bed without slough.  Wound Description (Comments):   Present on Admission: Yes          Subjective: Patient is sitting up in the chair today, able to make little progress with better Xopenex and respirometer followed by coughing.  Still remains very weak and gets hypoxic with minimal movement and exertion.  Examination: Constitutional: Not in acute distress.  9 L nasal cannula Respiratory: Diffuse bilateral rhonchi Cardiovascular: Normal sinus rhythm, no rubs Abdomen: Nontender nondistended good bowel sounds Musculoskeletal: No edema noted Skin: No rashes seen Neurologic: CN 2-12 grossly intact.  And nonfocal Psychiatric: Normal judgment and insight. Alert and oriented x 3. Normal mood.  Objective: Vitals:   08/01/21 2127 08/01/21 2151 08/02/21 0500 08/02/21 0827  BP: (!) 121/59  130/63   Pulse: 82  79   Resp:   (!) 22   Temp: 97.8 F (36.6 C)  97.9 F (36.6 C)   TempSrc: Oral  Oral   SpO2: 94% 93% 95% 93%  Weight:      Height:        Intake/Output Summary (Last 24 hours) at 08/02/2021 1128 Last data filed at 08/02/2021 0923 Gross per 24 hour  Intake 820 ml  Output 2300 ml  Net -1480 ml   Filed Weights   07/25/21 1500 07/28/21 0414  Weight: 110 kg 110 kg     Data Reviewed:   CBC: Recent Labs  Lab 07/27/21 0326 07/29/21 0409 07/30/21 0010 07/31/21 0406 08/01/21 0011 08/02/21 0445  WBC 6.5 7.0 6.4 5.8 5.8 8.0  NEUTROABS 4.5  --   --   --   --   --   HGB 11.0* 11.2* 11.1* 11.5* 11.8* 11.7*  HCT 33.0* 33.4* 33.7* 34.7* 35.7* 35.5*  MCV 92.4 92.3 91.8 92.8 92.5 93.7  PLT 111* 116* 108* 115* 104* 378*   Basic Metabolic Panel: Recent Labs  Lab 07/27/21 0326 07/28/21 0315 07/30/21 0010 07/31/21 0406 08/01/21 0011 08/02/21 0445  NA 132* 131* 133* 134* 135 137  K 3.6 3.9 3.9 3.8 4.4 4.3  CL 96* 94*  93* 94* 95* 95*  CO2 32 33* 34* 33* 36* 35*  GLUCOSE 100* 119* 208* 109* 131* 119*  BUN 15 14 20 22 18 20   CREATININE 0.75 0.80 0.87 0.75 0.86 0.80  CALCIUM 7.7* 7.6* 7.9* 8.0* 8.0* 8.3*  MG 2.3 2.4 2.4 2.5* 2.4 2.2  PHOS 3.7 3.4  --   --   --   --    GFR: Estimated Creatinine Clearance: 96 mL/min (by C-G formula based on SCr of 0.8 mg/dL). Liver Function Tests: Recent Labs  Lab 07/27/21 0326 07/28/21 0315  AST 17 18  ALT 21 19  ALKPHOS 180* 169*  BILITOT 1.0 1.0  PROT 5.6* 5.8*  ALBUMIN 2.4* 2.4*   No results for input(s): LIPASE, AMYLASE in the last 168 hours. No results for input(s): AMMONIA in the last 168 hours. Coagulation Profile: No results for input(s): INR, PROTIME in the last 168 hours.  Cardiac Enzymes: No results for input(s): CKTOTAL,  CKMB, CKMBINDEX, TROPONINI in the last 168 hours. BNP (last 3 results) No results for input(s): PROBNP in the last 8760 hours. HbA1C: No results for input(s): HGBA1C in the last 72 hours. CBG: Recent Labs  Lab 08/01/21 1117 08/01/21 1638 08/01/21 2126 08/02/21 0735 08/02/21 1103  GLUCAP 145* 172* 182* 95 171*   Lipid Profile: No results for input(s): CHOL, HDL, LDLCALC, TRIG, CHOLHDL, LDLDIRECT in the last 72 hours. Thyroid Function Tests: No results for input(s): TSH, T4TOTAL, FREET4, T3FREE, THYROIDAB in the last 72 hours. Anemia Panel: No results for input(s): VITAMINB12, FOLATE, FERRITIN, TIBC, IRON, RETICCTPCT in the last 72 hours. Sepsis Labs: Recent Labs  Lab 07/29/21 0409 08/02/21 0445  PROCALCITON 0.21 <0.10    Recent Results (from the past 240 hour(s))  Resp Panel by RT-PCR (Flu A&B, Covid) Nasopharyngeal Swab     Status: None   Collection Time: 07/24/21  6:00 PM   Specimen: Nasopharyngeal Swab; Nasopharyngeal(NP) swabs in vial transport medium  Result Value Ref Range Status   SARS Coronavirus 2 by RT PCR NEGATIVE NEGATIVE Final    Comment: (NOTE) SARS-CoV-2 target nucleic acids are NOT  DETECTED.  The SARS-CoV-2 RNA is generally detectable in upper respiratory specimens during the acute phase of infection. The lowest concentration of SARS-CoV-2 viral copies this assay can detect is 138 copies/mL. A negative result does not preclude SARS-Cov-2 infection and should not be used as the sole basis for treatment or other patient management decisions. A negative result may occur with  improper specimen collection/handling, submission of specimen other than nasopharyngeal swab, presence of viral mutation(s) within the areas targeted by this assay, and inadequate number of viral copies(<138 copies/mL). A negative result must be combined with clinical observations, patient history, and epidemiological information. The expected result is Negative.  Fact Sheet for Patients:  EntrepreneurPulse.com.au  Fact Sheet for Healthcare Providers:  IncredibleEmployment.be  This test is no t yet approved or cleared by the [place] FDA and  has been authorized for detection and/or diagnosis of SARS-CoV-2 by FDA under an Emergency Use Authorization (EUA). This EUA will remain  in effect (meaning this test can be used) for the duration of the COVID-19 declaration under Section 564(b)(1) of the Act, 21 U.S.C.section 360bbb-3(b)(1), unless the authorization is terminated  or revoked sooner.       Influenza A by PCR NEGATIVE NEGATIVE Final   Influenza B by PCR NEGATIVE NEGATIVE Final    Comment: (NOTE) The Xpert Xpress SARS-CoV-2/FLU/RSV plus assay is intended as an aid in the diagnosis of influenza from Nasopharyngeal swab specimens and should not be used as a sole basis for treatment. Nasal washings and aspirates are unacceptable for Xpert Xpress SARS-CoV-2/FLU/RSV testing.  Fact Sheet for Patients: EntrepreneurPulse.com.au  Fact Sheet for Healthcare Providers: IncredibleEmployment.be  This test is not yet  approved or cleared by the [place] FDA and has been authorized for detection and/or diagnosis of SARS-CoV-2 by FDA under an Emergency Use Authorization (EUA). This EUA will remain in effect (meaning this test can be used) for the duration of the COVID-19 declaration under Section 564(b)(1) of the Act, 21 U.S.C. section 360bbb-3(b)(1), unless the authorization is terminated or revoked.  Performed at [facility], [place] [address], [place], [place] [address]   Blood culture (routine x 2)     Status: None   Collection Time: 07/24/21  6:00 PM   Specimen: BLOOD  Result Value Ref Range Status   Specimen Description   Final    BLOOD  RIGHT ANTECUBITAL Performed at [facility] [address] [place], [place] [address]    Special Requests   Final    BOTTLES DRAWN AEROBIC AND ANAEROBIC Blood Culture adequate volume Performed at [facility] [address] [place], [place] [address]    Culture   Final    NO GROWTH 5 DAYS Performed at [facility], [place] [address], [place], [place] [address]    Report Status 07/30/2021 FINAL  Final  Blood culture (routine x 2)     Status: None   Collection Time: 07/24/21  6:30 PM   Specimen: BLOOD  Result Value Ref Range Status   Specimen Description   Final    BLOOD LEFT ANTECUBITAL Performed at [facility] [address], [place], [place] [address]    Special Requests   Final    BOTTLES DRAWN AEROBIC AND ANAEROBIC Blood Culture adequate volume Performed at [facility] [address] [place], [place] [address]    Culture   Final    NO GROWTH 5 DAYS Performed at [facility], [place] [address], [place], [place] [address]    Report Status 07/30/2021 FINAL  Final  MRSA Next Gen by PCR, Nasal     Status: None   Collection Time: 07/25/21  2:59 PM   Specimen: Nasal Mucosa; Nasal Swab  Result Value Ref Range Status   MRSA by PCR Next Gen  NOT DETECTED NOT DETECTED Final    Comment: (NOTE) The GeneXpert MRSA Assay (FDA approved for NASAL specimens only), is one component of a comprehensive MRSA colonization surveillance program. It is not intended to diagnose MRSA infection nor to guide or monitor treatment for MRSA infections. Test performance is not FDA approved in patients less than 6 years old. Performed at [facility], [place] [address], [place],  [address]          Radiology Studies: DG Chest Port 1 View  Result Date: 07/31/2021 CLINICAL DATA:  Shortness of breath EXAM: PORTABLE CHEST 1 VIEW COMPARISON:  Previous studies including the examination of 07/27/2021 FINDINGS: Transverse diameter of heart is increased. There is poor inspiration. There are patchy infiltrates and nodular densities in both lungs. There is possible worsening of infiltrate in the left lower lung fields. There is blunting of lateral CP angles. There is no pneumothorax. IMPRESSION: There are patchy infiltrates and discrete nodules in both lungs. Poor inspiration. There is possible interval worsening of infiltrate in the left lower lung fields. Possible small bilateral pleural effusions. Electronically Signed   By: Elmer Picker M.D.   On: 07/31/2021 11:53        Scheduled Meds:  (feeding supplement) PROSource Plus  30 mL Oral Daily   budesonide (PULMICORT) nebulizer solution  0.5 mg Nebulization BID   buPROPion  150 mg Oral Daily   chlorhexidine  15 mL Mouth Rinse BID   Chlorhexidine Gluconate Cloth  6 each Topical Daily   enoxaparin (LOVENOX) injection  110 mg Subcutaneous Q12H   feeding supplement  237 mL Oral Q24H   fluticasone  2 spray Each Nare Daily   insulin aspart  0-15 Units Subcutaneous TID WC   insulin aspart  0-5 Units Subcutaneous QHS   insulin aspart  3 Units Subcutaneous TID WC   insulin glargine-yfgn  25 Units Subcutaneous QHS   ipratropium-albuterol  3 mL Inhalation TID   loratadine  10 mg  Oral Daily   mouth rinse  15 mL  Mouth Rinse BID   methylPREDNISolone (SOLU-MEDROL) injection  40 mg Intravenous Daily   multivitamin with minerals  1 tablet Oral Daily   oxybutynin  10 mg Oral Daily   pantoprazole  40 mg Oral Daily   simvastatin  20 mg Oral q1800   sodium chloride flush  3 mL Intravenous Q12H   tamsulosin  0.4 mg Oral BID   Continuous Infusions:     LOS: 9 days   Time spent= 35 mins    Tierany Appleby Arsenio Loader, MD [facility]  If 7PM-7AM, please contact night-coverage  08/02/2021, 11:28 AM

## 2021-08-02 NOTE — Progress Notes (Addendum)
NAME:  COPPER BASNETT, MRN:  [id number], DOB:  22-Feb-1941, LOS: 9 ADMISSION DATE:  07/24/2021, CONSULTATION DATE:  12/5 REFERRING MD:  Reesa Chew, CHIEF COMPLAINT:  Pulmonary nodules   History of Present Illness:  80 y/o male with multiple medical problems found to have radiographic findings worrisome for metastatic malignancy when he was admitted for COVID 19.  He was re-admitted for hypoxemia in the setting of pulmonary embolism.  PCCM consulted for consideration of biopsy of his pulmonary nodules.   Pertinent  Medical History  AI Arthritis CAD DM2 Diverticulosis Hyperlipidemia Hypertension OSA Pulmonary hypertension   Significant Hospital Events: Including procedures, antibiotic start and stop dates in addition to other pertinent events   11/30 presented with dyspnea, admit to hospitalists, found to have segmental, acute PE; CT chest with multiple pulmonary nodules throughout lungs, lesions in liver Echo 07/25/21 TDS but no obvious RV or LV issues   Scheduled Meds:  (feeding supplement) PROSource Plus  30 mL Oral Daily   budesonide (PULMICORT) nebulizer solution  0.5 mg Nebulization BID   buPROPion  150 mg Oral Daily   chlorhexidine  15 mL Mouth Rinse BID   Chlorhexidine Gluconate Cloth  6 each Topical Daily   enoxaparin (LOVENOX) injection  110 mg Subcutaneous Q12H   feeding supplement  237 mL Oral Q24H   fluticasone  2 spray Each Nare Daily   insulin aspart  0-15 Units Subcutaneous TID WC   insulin aspart  0-5 Units Subcutaneous QHS   insulin aspart  3 Units Subcutaneous TID WC   insulin glargine-yfgn  25 Units Subcutaneous QHS   ipratropium-albuterol  3 mL Inhalation TID   loratadine  10 mg Oral Daily   mouth rinse  15 mL Mouth Rinse BID   methylPREDNISolone (SOLU-MEDROL) injection  40 mg Intravenous Daily   multivitamin with minerals  1 tablet Oral Daily   oxybutynin  10 mg Oral Daily   pantoprazole  40 mg Oral Daily   simvastatin  20 mg Oral q1800   sodium chloride  flush  3 mL Intravenous Q12H   tamsulosin  0.4 mg Oral BID   Continuous Infusions: PRN Meds:.acetaminophen **OR** acetaminophen, alum & mag hydroxide-simeth, calcium carbonate, guaiFENesin-dextromethorphan, ipratropium-albuterol, lip balm, polyethylene glycol, senna-docusate, sodium chloride    Interim History / Subjective:  Sob at rest / refusing cpap "dries me out"  nocturnally / no cough or cp  Objective   Blood pressure 130/63, pulse 79, temperature 97.9 F (36.6 C), temperature source Oral, resp. rate (!) 22, height _0  (1.803 m), weight 110 kg, SpO2 93 %.        Intake/Output Summary (Last 24 hours) at 08/02/2021 1021 Last data filed at 08/02/2021 4008 Gross per 24 hour  Intake 820 ml  Output 2300 ml  Net -1480 ml   Filed Weights   07/25/21 1500 07/28/21 0414  Weight: 110 kg 110 kg    Examination: Tmax 99 General appearance:    uncomfortabel somber wm mild increased wob at 45 degrees and barely able to get IS over 300 cc   At Rest 02 sats  93% on 8lpm   No jvd Oropharynx clear,  mucosa nl Neck supple Lungs with min exp > insp rhonchi bilaterally RRR no s3 or or sign murmur Abd obese with very poor  excursion  Extr warm with no edema or clubbing noted/ marked chronic venous stasis changes L > R Neuro  Sensorium intact,  no apparent motor deficits      Resolved Hospital Problem  list     Assessment & Plan:  Acute pulmonary embolism Multiple pulmonary nodules/masses Acute hypoxemic and hypercarbic  respiratory failure due to pulmonary masses and pulmonary embolism Baseline pulmonary hypertension OSA Obesity Physical deconditioning Sinus congestion  I don't see any opportunity at all to address the underlying problems in the hospital namely probable metastatic ca (and ? Hypercoagulability on that basis) along with obesity and severe deconditioning post covid  - it is unlikely that the PE's are causing refractory hypoxemia and more likely related to met ca/ ?  Lymphangitic carcinomatosis/ atx from obesity and apparent progressive bed ridden state.   I would rec a trial of his own cpap machine at night and if not effective you could try bipap while in bed per RT but these are just temporizing measures.   If by some chance his overall condition responds to anticoagulation and  bipap and he's able to spend more time up in chair/ off bipap and lower 02 requirement  to point of discharge then PET and  PET targeted bx reasonable but advised him we're very unlikely even then  to find a  malignancy at this point for which he could tolerate even palliative form of rx and instead favor comfort care/ NCB in event he continues to decline.  Wish we had more to offer - pulmonary f/u is  prn.        Best Practice (right click and "Reselect all SmartList Selections" daily)  Per [facility]  Labs   CBC: Recent Labs  Lab 07/27/21 0326 07/29/21 0409 07/30/21 0010 07/31/21 0406 08/01/21 0011 08/02/21 0445  WBC 6.5 7.0 6.4 5.8 5.8 8.0  NEUTROABS 4.5  --   --   --   --   --   HGB 11.0* 11.2* 11.1* 11.5* 11.8* 11.7*  HCT 33.0* 33.4* 33.7* 34.7* 35.7* 35.5*  MCV 92.4 92.3 91.8 92.8 92.5 93.7  PLT 111* 116* 108* 115* 104* 110*    Basic Metabolic Panel: Recent Labs  Lab 07/27/21 0326 07/28/21 0315 07/30/21 0010 07/31/21 0406 08/01/21 0011 08/02/21 0445  NA 132* 131* 133* 134* 135 137  K 3.6 3.9 3.9 3.8 4.4 4.3  CL 96* 94* 93* 94* 95* 95*  CO2 32 33* 34* 33* 36* 35*  GLUCOSE 100* 119* 208* 109* 131* 119*  BUN _0 CREATININE 0.75 0.80 0.87 0.75 0.86 0.80  CALCIUM 7.7* 7.6* 7.9* 8.0* 8.0* 8.3*  MG 2.3 2.4 2.4 2.5* 2.4 2.2  PHOS 3.7 3.4  --   --   --   --    GFR: Estimated Creatinine Clearance: 96 mL/min (by C-G formula based on SCr of 0.8 mg/dL). Recent Labs  Lab 07/29/21 0409 07/30/21 0010 07/31/21 0406 08/01/21 0011 08/02/21 0445  PROCALCITON 0.21  --   --   --  <0.10  WBC 7.0 6.4 5.8 5.8 8.0    Liver Function Tests: Recent  Labs  Lab 07/27/21 0326 07/28/21 0315  AST 17 18  ALT 21 19  ALKPHOS 180* 169*  BILITOT 1.0 1.0  PROT 5.6* 5.8*  ALBUMIN 2.4* 2.4*   No results for input(s): LIPASE, AMYLASE in the last 168 hours. No results for input(s): AMMONIA in the last 168 hours.  ABG    Component Value Date/Time   HCO3 33.5 (H) 07/24/2021 1800   O2SAT 31.8 07/24/2021 1800     Coagulation Profile: No results for input(s): INR, PROTIME in the last 168 hours.   Cardiac Enzymes: No results for  input(s): CKTOTAL, CKMB, CKMBINDEX, TROPONINI in the last 168 hours.  HbA1C: HbA1c, POC (controlled diabetic range)  Date/Time Value Ref Range Status  07/13/2018 03:19 PM 6.0 0.0 - 7.0 % Final   Hgb A1c MFr Bld  Date/Time Value Ref Range Status  07/01/2021 04:42 AM 6.2 (H) 4.8 - 5.6 % Final    Comment:    (NOTE) Pre diabetes:          5.7%-6.4%  Diabetes:              >6.4%  Glycemic control for   <7.0% adults with diabetes   06/30/2021 02:50 AM 6.0 (H) 4.8 - 5.6 % Final    Comment:    (NOTE) Pre diabetes:          5.7%-6.4%  Diabetes:              >6.4%  Glycemic control for   <7.0% adults with diabetes     CBG: Recent Labs  Lab 08/01/21 0751 08/01/21 1117 08/01/21 1638 08/01/21 2126 08/02/21 0735  GLUCAP 104* 145* 172* 182* 95    Christinia Gully, MD Pulmonary and [facility] [phone]   After 7:00 pm call Elink  [phone]

## 2021-08-02 NOTE — Care Management Important Message (Signed)
Important Message  Patient Details IM Letter placed in Patients room. Name: Jacob Bishop MRN: [id number] Date of Birth: [birth year]   Medicare Important Message Given:  Yes     Kerin Salen 08/02/2021, 1:09 PM

## 2021-08-02 NOTE — Progress Notes (Signed)
Physical Therapy Treatment Patient Details Name: Jacob Bishop MRN: [id number] DOB: [birth year] Today's Date: 08/02/2021   History of Present Illness 80 yo male admitted with PE, LE DVT. Hx of COVID, O2 dep at baseline-2,  CAD, OSA, DM2, HTN, thrombocytopenia, liver and lung lesion concerning for metastatic disease admitted for shortness of breath found to have acute PE.  Due to metastatic lesion, IR consulted for biopsy.  Unable to perform liver biopsy as it was thought it could be hemangioma.  Patient needs lung biopsy once breathing is stabilized.  Seen by pulmonary.  Palliative care team consulted.  Concerns of metastatic spread.    PT Comments    Pt was OOB in recliner via nursing.  Assisted with amb in hallway required increased time.  General transfer comment: Used + 2 side by side assist this session to attempt gait.  Pt able to rise from elevated bed at Mod Assist present with intial posterior lean and instability.General Gait Details: decreased amb distance this session due to increased HR as high as 186.  Sats stayed > 90% on 8lts HFNC but RR increased to 38.  Marland Kitchen  Recliner following for safety.  VERY limited activty tolerance.  Unable to tolerate further exersion.General bed mobility comments: Mod Assist to support b LE up onto bed and + 2 side bu side MAX Assist to scoot to [facility]. Pt will need ST Rehab at SNF.   Recommendations for follow up therapy are one component of a multi-disciplinary discharge planning process, led by the attending physician.  Recommendations may be updated based on patient status, additional functional criteria and insurance authorization.  Follow Up Recommendations  Skilled nursing-short term rehab (<3 hours/day)     Assistance Recommended at Discharge    Equipment Recommendations  None recommended by PT    Recommendations for Other Services       Precautions / Restrictions Precautions Precautions: None Precaution Comments: monitor O2-currently on HFNC      Mobility  Bed Mobility Overal bed mobility: Needs Assistance Bed Mobility: Sit to Supine       Sit to supine: Mod assist   General bed mobility comments: Mod Assist to support b LE up onto bed and + 2 side bu side MAX Assist to scoot to [facility].    Transfers Overall transfer level: Needs assistance Equipment used: Rolling walker (2 wheels) Transfers: Sit to/from Stand Sit to Stand: From elevated surface;Mod assist;+2 safety/equipment           General transfer comment: Used + 2 side by side assist this session to attempt gait.  Pt able to rise from elevated bed at Mod Assist present with intial posterior lean and instability.    Ambulation/Gait Ambulation/Gait assistance: Min assist;+2 safety/equipment Gait Distance (Feet): 9 Feet Assistive device: Rolling walker (2 wheels) Gait Pattern/deviations: Step-through pattern;Decreased stride length Gait velocity: decreased     General Gait Details: decreased amb distance this session due to increased HR as high as 186.  Sats stayed > 90% on 8lts HFNC but RR increased to 38.  Marland Kitchen  Recliner following for safety.  VERY limited activty tolerance.  Unable to tolerate further exersion.   Stairs             Wheelchair Mobility    Modified Rankin (Stroke Patients Only)       Balance  Cognition Arousal/Alertness: Awake/alert Behavior During Therapy: WFL for tasks assessed/performed Overall Cognitive Status: Within Functional Limits for tasks assessed                                 General Comments: AxO x 3 very pleasant and willing        Exercises      General Comments        Pertinent Vitals/Pain Pain Assessment: No/denies pain    Home Living                          Prior Function            PT Goals (current goals can now be found in the care plan section) Progress towards PT goals: Progressing toward goals     Frequency    Min 2X/week      PT Plan Current plan remains appropriate    Co-evaluation              AM-PAC PT "6 Clicks" Mobility   Outcome Measure  Help needed turning from your back to your side while in a flat bed without using bedrails?: A Little Help needed moving from lying on your back to sitting on the side of a flat bed without using bedrails?: A Little Help needed moving to and from a bed to a chair (including a wheelchair)?: A Little Help needed standing up from a chair using your arms (e.g., wheelchair or bedside chair)?: A Little Help needed to walk in hospital room?: A Lot Help needed climbing 3-5 steps with a railing? : A Lot 6 Click Score: 16    End of Session Equipment Utilized During Treatment: Gait belt Activity Tolerance: Patient limited by fatigue Patient left: in bed;with bed alarm set;with call bell/phone within reach Nurse Communication: Mobility status PT Visit Diagnosis: Muscle weakness (generalized) (M62.81);Difficulty in walking, not elsewhere classified (R26.2)     Time: 1607-3710 PT Time Calculation (min) (ACUTE ONLY): 37 min  Charges:  $Gait Training: 8-22 mins $Therapeutic Activity: 8-22 mins                     {Royal Beirne  PTA Acute  Rehabilitation Services Pager      [phone] Office      [phone]

## 2021-08-02 NOTE — Progress Notes (Signed)
Pt refused cpap for tonight. He said he doesn't like the way it feels, tried to explain the benefit of it. Pt still refused.

## 2021-08-02 NOTE — Plan of Care (Signed)

## 2021-08-02 NOTE — Progress Notes (Signed)
Nutrition Follow-up  DOCUMENTATION CODES:   Obesity unspecified  INTERVENTION:  - continue Ensure once/day and 30 ml Prosource Plus once/day.    NUTRITION DIAGNOSIS:   Increased nutrient needs related to acute illness, chronic illness as evidenced by estimated needs. -ongoing  GOAL:   Patient will meet greater than or equal to 90% of their needs -beginning to meet on average  MONITOR:   PO intake, Supplement acceptance, Labs, Weight trends  ASSESSMENT:   80 y.o. male with medical history of CAD, insulin-dependent DM, OSA on CPAP, on 2L O2 since recent COVID infection, lung nodules and liver lesion being worked up outpatient, HTN, CAD, HLD, arthritis, fatty liver, diverticulosis, aortic insufficiency, and tricuspid regurgitation. Patient presented to the ED with increased shortness of breath and increased O2 requirement. He had been discharged from the hospital on 11/8.  Limited meal intakes from dinner on 12/5-breakfast this AM shows patient eating 60-100% at meals. He has accepted all but 1 bottle of Ensure over the past week and accepted all but 1 packet of Prosource Plus over the past week.   He has not been weighed since 12/4 at which time it appears that weight was copied forward from 12/1. Deep pitting edema to BLE documented in the edema section of flow sheet. He is noted to be -3.86 L since admission.   Palliative Care following and last saw him yesterday afternoon. Patient to remain Full Code.   MD note from yesterday states that "patient is not sure if he would like lung nodule to be evaluated at this time therefore would like to hold off on oncology evaluation". Patient noted to have metastatic lesions to lung and liver.     Labs reviewed; CBG: 95 mg/dl, Cl: 95 mmol/l, Ca: 8.3 mg/dl.  Medications reviewed; sliding scale novolog, 3 units novolog TID, 25 units semglee/day, 40 mg solu-medrol/day, 1 tablet multivitamin with minerals/day, 40 mg oral protonix/day.   Diet  Order:   Diet Order             Diet Carb Modified Fluid consistency: Thin; Room service appropriate? Yes  Diet effective now                   EDUCATION NEEDS:   Not appropriate for education at this time  Skin:  Skin Assessment: Skin Integrity Issues: Skin Integrity Issues:: Stage II Stage II: bilateral buttocks  Last BM:  12/6 per nursing flow sheet  Height:   Ht Readings from Last 1 Encounters:  07/25/21 5\' 11"  (1.803 m)    Weight:   Wt Readings from Last 1 Encounters:  07/28/21 110 kg     Estimated Nutritional Needs:  Kcal:  2000-2200 kcal Protein:  105-125 grams Fluid:  >/= 1.7 L/day     Jarome Matin, MS, RD, LDN, CNSC Inpatient Clinical Dietitian RD pager # available in [facility]  After hours/weekend pager # available in [facility]

## 2021-08-02 NOTE — Progress Notes (Signed)
Daily Progress Note   Patient Name: Jacob Bishop       Date: 08/02/2021 DOB: [birth year]  Age: 80 y.o. MRN#: [id number] Attending Physician: Damita Lack, MD Primary Care Physician: [name], NP Admit Date: 07/24/2021  Reason for Consultation/Follow-up: Establishing goals of care  Subjective: Currently getting a breathing treatment, there are several flowers and gifts in the room, patient states that it [birth year] birthday yesterday.   Length of Stay: 9  Current Medications: Scheduled Meds:   (feeding supplement) PROSource Plus  30 mL Oral Daily   budesonide (PULMICORT) nebulizer solution  0.5 mg Nebulization BID   buPROPion  150 mg Oral Daily   chlorhexidine  15 mL Mouth Rinse BID   Chlorhexidine Gluconate Cloth  6 each Topical Daily   enoxaparin (LOVENOX) injection  110 mg Subcutaneous Q12H   feeding supplement  237 mL Oral Q24H   fluticasone  2 spray Each Nare Daily   insulin aspart  0-15 Units Subcutaneous TID WC   insulin aspart  0-5 Units Subcutaneous QHS   insulin aspart  3 Units Subcutaneous TID WC   insulin glargine-yfgn  25 Units Subcutaneous QHS   ipratropium-albuterol  3 mL Inhalation TID   loratadine  10 mg Oral Daily   mouth rinse  15 mL Mouth Rinse BID   methylPREDNISolone (SOLU-MEDROL) injection  40 mg Intravenous Daily   multivitamin with minerals  1 tablet Oral Daily   oxybutynin  10 mg Oral Daily   pantoprazole  40 mg Oral Daily   simvastatin  20 mg Oral q1800   sodium chloride flush  3 mL Intravenous Q12H   tamsulosin  0.4 mg Oral BID    Continuous Infusions:   PRN Meds: acetaminophen **OR** acetaminophen, alum & mag hydroxide-simeth, calcium carbonate, guaiFENesin-dextromethorphan, ipratropium-albuterol, lip balm, polyethylene glycol,  senna-docusate, sodium chloride  Physical Exam         Currently on breathing treatment No distress Gets dyspneic easily No edema Has venous stasis changes both LE No focal deficits  Vital Signs: BP 130/63 (BP Location: Left Arm)   Pulse 100   Temp 97.9 F (36.6 C) (Axillary)   Resp (!) 22   Ht 5\' 11"  (1.803 m)   Wt 110 kg   SpO2 91%   BMI 33.82 kg/m  SpO2: SpO2: 91 % O2  Device: O2 Device: Nasal Cannula O2 Flow Rate: O2 Flow Rate (L/min): 8 L/min  Intake/output summary:  Intake/Output Summary (Last 24 hours) at 08/02/2021 1459 Last data filed at 08/02/2021 1300 Gross per 24 hour  Intake 700 ml  Output 2575 ml  Net -1875 ml   LBM: Last BM Date: 07/30/21 Baseline Weight: Weight: 110 kg Most recent weight: Weight: 110 kg       Palliative Assessment/Data:      Patient Active Problem List   Diagnosis Date Noted   Chest pain 07/27/2021   Depression 07/25/2021   Pressure injury of skin 07/25/2021   Acute pulmonary embolus ([place]) 07/24/2021   Acute on chronic respiratory failure with hypoxia ([place]) 06/30/2021   DM type 2 with diabetic mixed hyperlipidemia ([place]) 06/30/2021   Mass of lung 06/27/2021   Lesion of liver 06/27/2021   Chronic respiratory failure with hypoxia ([place]) 06/27/2021   OA (osteoarthritis) of knee 10/04/2018   Other secondary pulmonary hypertension ([place]) 04/06/2018   Osteoarthritis, hand 05/05/2014   Carpal tunnel syndrome 05/02/2014   Eczema 01/23/2014   Thrombocytopenia ([place]) 11/19/2012   Overactive bladder 08/24/2012   Osteoarthritis of right knee 01/22/2012   Benign positional vertigo 07/09/2011   CAD, NATIVE VESSEL 10/22/2009   EDEMA 08/03/2009   Obstructive sleep apnea 05/03/2009   SKIN LESION 09/23/2007   Diabetes mellitus type 2, uncontrolled 03/23/2007   Hyperlipidemia 03/23/2007   Essential hypertension 03/23/2007   COLONIC POLYPS, HX OF 03/23/2007   Morbid obesity ([place]) 03/23/2007    Palliative Care Assessment & Plan    Patient Profile:    Assessment:  Acute PE CT imaging showing multiple pulmonary nodules/masses as well as metastatic lesions in liver.  Has OSA DM, recent COVID-19 infection Functional decline Dyspnea on slight exertion   Recommendations/Plan:  Goals of care discussions again re discussed with patient, discussed about his current condition. Code status discussions again held, patient simply states," we are just going to keep on going." Call placed, unable to reach wife Leda Gauze, attempted twice, an hour apart, at [phone], goes to voicemail: PMT to continue goals of care discussions over the weekend.   Goals of Care and Additional Recommendations: Limitations on Scope of Treatment: Full Scope Treatment  Code Status:    Code Status Orders  (From admission, onward)           Start     Ordered   07/24/21 2305  Full code  Continuous        07/24/21 2306           Code Status History     Date Active Date Inactive Code Status Order ID Comments User Context   06/30/2021 0055 07/02/2021 2313 Full Code [id number]  Orene Desanctis, DO ED   10/04/2018 1538 10/11/2018 1930 Full Code [id number]  Gaynelle Arabian, MD Inpatient       Prognosis:  Unable to determine  Discharge Planning: To Be Determined  Care plan was discussed with  patient.   Thank you for allowing the Palliative Medicine Team to assist in the care of this patient.   Time In: 1400 Time Out: 1425 Total Time 25 Prolonged Time Billed  no       Greater than 50%  of this time was spent counseling and coordinating care related to the above assessment and plan.  Loistine Chance, MD  Please contact Palliative Medicine Team phone at [phone] for questions and concerns.

## 2021-08-03 ENCOUNTER — Inpatient Hospital Stay (HOSPITAL_COMMUNITY): Payer: Medicare Other

## 2021-08-03 DIAGNOSIS — E1169 Type 2 diabetes mellitus with other specified complication: Secondary | ICD-10-CM | POA: Diagnosis not present

## 2021-08-03 DIAGNOSIS — I2510 Atherosclerotic heart disease of native coronary artery without angina pectoris: Secondary | ICD-10-CM | POA: Diagnosis not present

## 2021-08-03 DIAGNOSIS — Z515 Encounter for palliative care: Secondary | ICD-10-CM | POA: Diagnosis not present

## 2021-08-03 DIAGNOSIS — I2699 Other pulmonary embolism without acute cor pulmonale: Secondary | ICD-10-CM | POA: Diagnosis not present

## 2021-08-03 DIAGNOSIS — J9621 Acute and chronic respiratory failure with hypoxia: Secondary | ICD-10-CM | POA: Diagnosis not present

## 2021-08-03 LAB — BASIC METABOLIC PANEL
Anion gap: 7 (ref 5–15)
BUN: 18 mg/dL (ref 8–23)
CO2: 34 mmol/L — ABNORMAL HIGH (ref 22–32)
Calcium: 8 mg/dL — ABNORMAL LOW (ref 8.9–10.3)
Chloride: 97 mmol/L — ABNORMAL LOW (ref 98–111)
Creatinine, Ser: 0.75 mg/dL (ref 0.61–1.24)
GFR, Estimated: >60 mL/min (ref >60)
Glucose, Bld: 61 mg/dL — ABNORMAL LOW (ref 70–99)
Potassium: 4 mmol/L (ref 3.5–5.1)
Sodium: 138 mmol/L (ref 135–145)

## 2021-08-03 LAB — MAGNESIUM: Magnesium: 2.4 mg/dL (ref 1.7–2.4)

## 2021-08-03 LAB — CBC
HCT: 36.6 % — ABNORMAL LOW (ref 39.0–52.0)
Hemoglobin: 11.8 g/dL — ABNORMAL LOW (ref 13.0–17.0)
MCH: 30.6 pg (ref 26.0–34.0)
MCHC: 32.2 g/dL (ref 30.0–36.0)
MCV: 95.1 fL (ref 80.0–100.0)
Platelets: 96 10*3/uL — ABNORMAL LOW (ref 150–400)
RBC: 3.85 MIL/uL — ABNORMAL LOW (ref 4.22–5.81)
RDW: 14.1 % (ref 11.5–15.5)
WBC: 7.5 10*3/uL (ref 4.0–10.5)
nRBC: 0 % (ref 0.0–0.2)

## 2021-08-03 LAB — BLOOD GAS, ARTERIAL
Acid-Base Excess: 8.9 mmol/L — ABNORMAL HIGH (ref 0.0–2.0)
Bicarbonate: 34.5 mmol/L — ABNORMAL HIGH (ref 20.0–28.0)
O2 Saturation: 98.4 %
Patient temperature: 98.6
pCO2 arterial: 53.8 mmHg — ABNORMAL HIGH (ref 32.0–48.0)
pH, Arterial: 7.424 (ref 7.350–7.450)
pO2, Arterial: 103 mmHg (ref 83.0–108.0)

## 2021-08-03 LAB — GLUCOSE, CAPILLARY
Glucose-Capillary: 60 mg/dL — ABNORMAL LOW (ref 70–99)
Glucose-Capillary: 88 mg/dL (ref 70–99)
Glucose-Capillary: 149 mg/dL — ABNORMAL HIGH (ref 70–99)
Glucose-Capillary: 180 mg/dL — ABNORMAL HIGH (ref 70–99)
Glucose-Capillary: 189 mg/dL — ABNORMAL HIGH (ref 70–99)

## 2021-08-03 MED ORDER — PREDNISONE 20 MG PO TABS
40.0000 mg | ORAL_TABLET | Freq: Every day | ORAL | Status: DC
  Administered 2021-08-03 – 2021-08-10 (×8): 40 mg via ORAL
  Filled 2021-08-03 (×8): qty 2

## 2021-08-03 MED ORDER — MORPHINE SULFATE (PF) 2 MG/ML IV SOLN
2.0000 mg | Freq: Once | INTRAVENOUS | Status: AC
  Administered 2021-08-03: 2 mg via INTRAVENOUS
  Filled 2021-08-03: qty 1

## 2021-08-03 MED ORDER — LORAZEPAM 2 MG/ML IJ SOLN
0.5000 mg | INTRAMUSCULAR | Status: DC | PRN
  Administered 2021-08-03 – 2021-08-09 (×2): 0.5 mg via INTRAVENOUS
  Filled 2021-08-03 (×2): qty 1

## 2021-08-03 MED ORDER — IPRATROPIUM-ALBUTEROL 0.5-2.5 (3) MG/3ML IN SOLN
3.0000 mL | Freq: Four times a day (QID) | RESPIRATORY_TRACT | Status: DC
  Administered 2021-08-03 – 2021-08-05 (×9): 3 mL via RESPIRATORY_TRACT
  Filled 2021-08-03 (×9): qty 3

## 2021-08-03 MED ORDER — INSULIN GLARGINE-YFGN 100 UNIT/ML ~~LOC~~ SOLN
15.0000 [IU] | Freq: Every day | SUBCUTANEOUS | Status: DC
  Administered 2021-08-03 – 2021-08-24 (×21): 15 [IU] via SUBCUTANEOUS
  Filled 2021-08-03 (×25): qty 0.15

## 2021-08-03 NOTE — Progress Notes (Signed)
Pt placed on BIPAP for increase WOB.

## 2021-08-03 NOTE — Progress Notes (Addendum)
Patient'w wife updated on patients condition via phone while in patients room. All questions answered. Verified contact info. Wife requested to be called if there are any other changes.

## 2021-08-03 NOTE — Progress Notes (Signed)
PMT no charge note.  Called to bedside to re discuss code status and broad goals of care with patient by his bedside RN as patient is now on BIPAP.   Patient awakens easily on the BIPAP, he still feels short of breath, however, he feels like the BIPAP is helping with his breathing.   I spoke with the patient again about code status, his breathing, his CT showing possible malignancy and barriers to obtaining biopsy/bronch or tissue diagnosis thus far. I asked him about his wishes in case his heart were to stop or in case he was not able to breathe even with the BIPAP mask on. Differences between full code full scope and DNR DNI were again brought up and re discussed.   Patient simply shakes his head "no". At this time, he is not able to clearly articulate his wishes, call placed and discussed with [name], she tells me that her wishes for the patient are for full code full scope care. She asks me what is being done to establish a diagnosis, she tells me that this is a markedly sudden decline for the patient, his functional decline started after his recent COVID diagnosis and that prior to that he was working and was functional. We talked about his tenuous resp status and I again discussed with her about DNR DNI. Wife requests for continuation of full code status.   I have recommended to meet with her face to face for further discussions, she states she will be at the hospital early tomorrow morning, PMT will follow up on 08-04-21.   Loistine Chance MD [place] [facility] No charge.

## 2021-08-03 NOTE — Progress Notes (Signed)
Patient was placed on cpap for 30+ min with no improvement in respiration rate. MD made aware orders for bipap placed. RT made aware. Will continue to monitor.

## 2021-08-03 NOTE — Plan of Care (Signed)

## 2021-08-03 NOTE — Progress Notes (Signed)
   08/03/21 1158  Assess: MEWS Score  ECG Heart Rate (!) 106  Resp (!) 40  SpO2 91 %  O2 Device CPAP  Assess: MEWS Score  MEWS Temp 0  MEWS Systolic 0  MEWS Pulse 1  MEWS RR 3  MEWS LOC 0  MEWS Score 4  MEWS Score Color Red  Assess: if the MEWS score is Yellow or Red  Were vital signs taken at a resting state? Yes  Does the patient meet 2 or more of the SIRS criteria? Yes  Does the patient have a confirmed or suspected source of infection? No  MEWS guidelines implemented *See Row Information* Yes  Treat  MEWS Interventions Administered scheduled meds/treatments;Administered prn meds/treatments;Escalated (See documentation below);Consulted Respiratory Therapy  Pain Scale 0-10  Pain Score 0  Take Vital Signs  Increase Vital Sign Frequency  Red: Q 1hr X 4 then Q 4hr X 4, if remains red, continue Q 4hrs  Escalate  MEWS: Escalate Red: discuss with charge nurse/RN and provider, consider discussing with RRT  Notify: Charge Nurse/RN  Name of Charge Nurse/RN Notified Lance,RN  Date Charge Nurse/RN Notified 08/03/21  Time Charge Nurse/RN Notified 1100  Notify: Provider  Provider Name/Title Reesa Chew  Date Provider Notified 08/03/21  Time Provider Notified 1200  Notification Type Page  Notification Reason Change in status  Provider response Evaluate remotely  Date of Provider Response 08/03/21  Time of Provider Response 1202  Notify: Rapid Response  Name of Rapid Response RN Notified Sarah,RN  Date Rapid Response Notified 08/03/21  Time Rapid Response Notified 1400  Assess: SIRS CRITERIA  SIRS Temperature  0  SIRS Pulse 1  SIRS Respirations  1  SIRS WBC 0  SIRS Score Sum  2  MD made aware. Pt repostioned, RT made aware prn treatment given o2 increased.

## 2021-08-03 NOTE — Progress Notes (Signed)
Pt off BIPAP on 6lpm cann hr 83 rr 25 upon entering room pt just finished eating treatment given pt will be placed back on BIPAP and settings adjusted about an hour after

## 2021-08-03 NOTE — Progress Notes (Signed)
Patient given morphine, straight cath'd for urine retention, 550out. And given ativan as ordered. Will monitor next 30 min for improvement, will keep team updated.

## 2021-08-03 NOTE — Progress Notes (Signed)
Daily Progress Note   Patient Name: Jacob Bishop       Date: 08/03/2021 DOB: 1941-06-16  Age: 80 y.o. MRN#: [id number] Attending Physician: Damita Lack, MD Primary Care Physician: [name], NP Admit Date: 07/24/2021  Reason for Consultation/Follow-up: Establishing goals of care  Subjective: Patient is much more awake alert, sitting up in bed, is able to feed himself breakfast, states that the work of breathing has improved.  Length of Stay: 10  Current Medications: Scheduled Meds:   (feeding supplement) PROSource Plus  30 mL Oral Daily   budesonide (PULMICORT) nebulizer solution  0.5 mg Nebulization BID   buPROPion  150 mg Oral Daily   chlorhexidine  15 mL Mouth Rinse BID   Chlorhexidine Gluconate Cloth  6 each Topical Daily   enoxaparin (LOVENOX) injection  110 mg Subcutaneous Q12H   feeding supplement  237 mL Oral Q24H   fluticasone  2 spray Each Nare Daily   insulin aspart  0-15 Units Subcutaneous TID WC   insulin aspart  0-5 Units Subcutaneous QHS   insulin aspart  3 Units Subcutaneous TID WC   insulin glargine-yfgn  15 Units Subcutaneous QHS   ipratropium-albuterol  3 mL Inhalation Q6H   loratadine  10 mg Oral Daily   mouth rinse  15 mL Mouth Rinse BID   multivitamin with minerals  1 tablet Oral Daily   oxybutynin  10 mg Oral Daily   pantoprazole  40 mg Oral Daily   predniSONE  40 mg Oral Q breakfast   simvastatin  20 mg Oral q1800   sodium chloride flush  3 mL Intravenous Q12H   tamsulosin  0.4 mg Oral BID    Continuous Infusions:   PRN Meds: acetaminophen **OR** acetaminophen, alum & mag hydroxide-simeth, calcium carbonate, guaiFENesin-dextromethorphan, ipratropium-albuterol, lip balm, polyethylene glycol, senna-docusate, sodium chloride  Physical  Exam         Currently on breathing treatment No distress Gets dyspneic easily No edema Has venous stasis changes both LE No focal deficits  Vital Signs: BP (!) 105/59 (BP Location: Left Arm)   Pulse 73   Temp 97.8 F (36.6 C) (Axillary)   Resp (!) 38   Ht 5\' 11"  (1.803 m)   Wt 110 kg   SpO2 93%   BMI 33.82 kg/m  SpO2: SpO2: 93 %  O2 Device: O2 Device: Nasal Cannula O2 Flow Rate: O2 Flow Rate (L/min): 6 L/min  Intake/output summary:  Intake/Output Summary (Last 24 hours) at 08/03/2021 1135 Last data filed at 08/03/2021 0530 Gross per 24 hour  Intake 423 ml  Output 775 ml  Net -352 ml    LBM: Last BM Date: 07/30/21 Baseline Weight: Weight: 110 kg Most recent weight: Weight: 110 kg       Palliative Assessment/Data:      Patient Active Problem List   Diagnosis Date Noted   Chest pain 07/27/2021   Depression 07/25/2021   Pressure injury of skin 07/25/2021   Acute pulmonary embolus ([place]) 07/24/2021   Acute on chronic respiratory failure with hypoxia ([place]) 06/30/2021   DM type 2 with diabetic mixed hyperlipidemia ([place]) 06/30/2021   Mass of lung 06/27/2021   Lesion of liver 06/27/2021   Chronic respiratory failure with hypoxia ([place]) 06/27/2021   OA (osteoarthritis) of knee 10/04/2018   Other secondary pulmonary hypertension ([place]) 04/06/2018   Osteoarthritis, hand 05/05/2014   Carpal tunnel syndrome 05/02/2014   Eczema 01/23/2014   Thrombocytopenia ([place]) 11/19/2012   Overactive bladder 08/24/2012   Osteoarthritis of right knee 01/22/2012   Benign positional vertigo 07/09/2011   CAD, NATIVE VESSEL 10/22/2009   EDEMA 08/03/2009   Obstructive sleep apnea 05/03/2009   SKIN LESION 09/23/2007   Diabetes mellitus type 2, uncontrolled 03/23/2007   Hyperlipidemia 03/23/2007   Essential hypertension 03/23/2007   COLONIC POLYPS, HX OF 03/23/2007   Morbid obesity ([place]) 03/23/2007    Palliative Care Assessment & Plan   Patient Profile:    Assessment:  Acute  PE CT imaging showing multiple pulmonary nodules/masses as well as metastatic lesions in liver.  Has OSA DM, recent COVID-19 infection Functional decline Dyspnea on slight exertion   Recommendations/Plan: Continue current mode of care Consider skilled nursing facility for rehabilitation attempt with palliative services following. Full code for now.  Goals of Care and Additional Recommendations: Limitations on Scope of Treatment: Full Scope Treatment  Code Status:    Code Status Orders  (From admission, onward)           Start     Ordered   07/24/21 2305  Full code  Continuous        07/24/21 2306           Code Status History     Date Active Date Inactive Code Status Order ID Comments User Context   06/30/2021 0055 07/02/2021 2313 Full Code [id number]  Orene Desanctis, DO ED   10/04/2018 1538 10/11/2018 1930 Full Code [id number]  Gaynelle Arabian, MD Inpatient       Prognosis:  Unable to determine  Discharge Planning: To Be Determined  Care plan was discussed with  patient.   Thank you for allowing the Palliative Medicine Team to assist in the care of this patient.   Time In: 10 Time Out: 10.25 Total Time 25 Prolonged Time Billed  no       Greater than 50%  of this time was spent counseling and coordinating care related to the above assessment and plan.  Loistine Chance, MD  Please contact Palliative Medicine Team phone at [phone] for questions and concerns.

## 2021-08-03 NOTE — Progress Notes (Signed)
PROGRESS NOTE    Jacob Bishop  VPX:[id number] DOB: 1940/09/17 DOA: 07/24/2021 PCP: [name], NP   Brief Narrative:  80 year old with chronic hypoxic respiratory failure with COVID-19 infection, CAD, OSA, DM2, HTN, thrombocytopenia, liver and lung lesion concerning for metastatic disease admitted for shortness of breath found to have acute PE.  Due to metastatic lesion, IR consulted for biopsy.  Unable to perform liver biopsy as it was thought it could be hemangioma.  Patient needs lung biopsy once breathing is stabilized.   Palliative care team consulted.  Concerns of metastatic spread.  Patient has been seen by pulmonary team as well.   Assessment & Plan:   Principal Problem:   Acute pulmonary embolus (HCC) Active Problems:   Obstructive sleep apnea   Essential hypertension   CAD, NATIVE VESSEL   Thrombocytopenia (HCC)   Mass of lung   Lesion of liver   Acute on chronic respiratory failure with hypoxia (HCC)   DM type 2 with diabetic mixed hyperlipidemia (HCC)   Depression   Pressure injury of skin   Chest pain  Acute respiratory distress with hypoxia; still on 6L [name] Bilateral diffuse rhonchi - Still has significant abnormal breath sounds requiring supplemental oxygen.  Not stable for any further biopsy for now but there is concerns of metastatic spread to his lungs. -Transition IV steroids to p.o. - Bronchodilators, I-S/flutter.  Out of bed to chair. - BNP and procalcitonin negative  Acute pulmonary embolism, subsegmental - Lovenox 1 mg/kg every 12 hours.  Eventually transition to NOAC - Echocardiogram EF normal. - Lower extremity ultrasound showed right lower extremity DVT  COVID-19 infection - 06/29/21. Now out of isolation. Repeat test here is negative. Procal 0.21  Metastatic lesions in the lung and liver -Unknown primary, liver lesions appears to be hemangioma, I have recommended CT liver protocol when more stable.  In terms of lung biopsy, he is unstable for  this.  Seen by pulmonary, concerns of lymphangitic spread.  Overall poor prognosis. Patient is not interested in biopsy and any cancer treatment at this time even if it ends up being malignant.  Diabetes mellitus type 2 - Reduce Semglee 15 units due to hypoglycemia.  Sliding scale and Accu-Cheks.    Thrombocytopenia -No evidence of bleeding.  Continue to monitor  Depression - Wellbutrin  Hyperlipidemia - Zocor  Obstructive sleep apnea -Bedtime CPAP as needed  Over poor respiratory efforts.  Seen by palliative care team.  Will need ongoing discussions with patient and family as he is overall poor prognosis.  DVT prophylaxis: Lovenox  Code Status: Full code Family Communication: Unable to get in touch with his wife, no answer  Status is: Inpatient  Remains inpatient appropriate because: Still has significant amount of abnormal breath sounds.  He is not safe for discharge.       Nutritional status  Nutrition Problem: Increased nutrient needs Etiology: acute illness, chronic illness  Signs/Symptoms: estimated needs  Interventions: Ensure Enlive (each supplement provides 350kcal and 20 grams of protein), Prostat, MVI  Body mass index is 33.82 kg/m.  Pressure Injury 07/25/21 Buttocks Right Stage 2 -  Partial thickness loss of dermis presenting as a shallow open injury with a red, pink wound bed without slough. (Active)  07/25/21 1530  Location: Buttocks  Location Orientation: Right  Staging: Stage 2 -  Partial thickness loss of dermis presenting as a shallow open injury with a red, pink wound bed without slough.  Wound Description (Comments):   Present on Admission: Yes  Pressure Injury 07/25/21 Buttocks Left Stage 2 -  Partial thickness loss of dermis presenting as a shallow open injury with a red, pink wound bed without slough. (Active)  07/25/21 1531  Location: Buttocks  Location Orientation: Left  Staging: Stage 2 -  Partial thickness loss of dermis  presenting as a shallow open injury with a red, pink wound bed without slough.  Wound Description (Comments):   Present on Admission: Yes     Subjective: Patient tells me overall he feels a little better in terms of his breathing.  He has been weaned down to 6 L nasal cannula.  He is finally able to cough and bring up mucus.  Examination:  Constitutional: Not in acute distress, chronically ill.  6 L nasal cannula. Respiratory: Bilateral rhonchi Cardiovascular: Normal sinus rhythm, no rubs Abdomen: Nontender nondistended good bowel sounds Musculoskeletal: No edema noted Skin: No rashes seen Neurologic: CN 2-12 grossly intact.  And nonfocal Psychiatric: Normal judgment and insight. Alert and oriented x 3. Normal mood. Objective: Vitals:   08/02/21 2002 08/02/21 2003 08/02/21 2114 08/03/21 0514  BP:   113/62 (!) 105/59  Pulse:   79 75  Resp:   17   Temp:   98.1 F (36.7 C) 97.8 F (36.6 C)  TempSrc:   Oral Axillary  SpO2: 98% 98% 98% 96%  Weight:      Height:        Intake/Output Summary (Last 24 hours) at 08/03/2021 0802 Last data filed at 08/02/2021 2130 Gross per 24 hour  Intake 423 ml  Output 275 ml  Net 148 ml   Filed Weights   07/25/21 1500 07/28/21 0414  Weight: 110 kg 110 kg     Data Reviewed:   CBC: Recent Labs  Lab 07/30/21 0010 07/31/21 0406 08/01/21 0011 08/02/21 0445 08/03/21 0441  WBC 6.4 5.8 5.8 8.0 7.5  HGB 11.1* 11.5* 11.8* 11.7* 11.8*  HCT 33.7* 34.7* 35.7* 35.5* 36.6*  MCV 91.8 92.8 92.5 93.7 95.1  PLT 108* 115* 104* 110* 96*   Basic Metabolic Panel: Recent Labs  Lab 07/28/21 0315 07/30/21 0010 07/31/21 0406 08/01/21 0011 08/02/21 0445 08/03/21 0441  NA 131* 133* 134* 135 137 138  K 3.9 3.9 3.8 4.4 4.3 4.0  CL 94* 93* 94* 95* 95* 97*  CO2 33* 34* 33* 36* 35* 34*  GLUCOSE 119* 208* 109* 131* 119* 61*  BUN 14 20 22 18 20 18   CREATININE 0.80 0.87 0.75 0.86 0.80 0.75  CALCIUM 7.6* 7.9* 8.0* 8.0* 8.3* 8.0*  MG 2.4 2.4 2.5* 2.4  2.2 2.4  PHOS 3.4  --   --   --   --   --    GFR: Estimated Creatinine Clearance: 96 mL/min (by C-G formula based on SCr of 0.75 mg/dL). Liver Function Tests: Recent Labs  Lab 07/28/21 0315  AST 18  ALT 19  ALKPHOS 169*  BILITOT 1.0  PROT 5.8*  ALBUMIN 2.4*   No results for input(s): LIPASE, AMYLASE in the last 168 hours. No results for input(s): AMMONIA in the last 168 hours. Coagulation Profile: No results for input(s): INR, PROTIME in the last 168 hours.  Cardiac Enzymes: No results for input(s): CKTOTAL, CKMB, CKMBINDEX, TROPONINI in the last 168 hours. BNP (last 3 results) No results for input(s): PROBNP in the last 8760 hours. HbA1C: No results for input(s): HGBA1C in the last 72 hours. CBG: Recent Labs  Lab 08/01/21 2126 08/02/21 0735 08/02/21 1103 08/02/21 1607 08/02/21 2112  GLUCAP 182* 95  171* 128* 134*   Lipid Profile: No results for input(s): CHOL, HDL, LDLCALC, TRIG, CHOLHDL, LDLDIRECT in the last 72 hours. Thyroid Function Tests: No results for input(s): TSH, T4TOTAL, FREET4, T3FREE, THYROIDAB in the last 72 hours. Anemia Panel: No results for input(s): VITAMINB12, FOLATE, FERRITIN, TIBC, IRON, RETICCTPCT in the last 72 hours. Sepsis Labs: Recent Labs  Lab 07/29/21 0409 08/02/21 0445  PROCALCITON 0.21 <0.10    Recent Results (from the past 240 hour(s))  Resp Panel by RT-PCR (Flu A&B, Covid) Nasopharyngeal Swab     Status: None   Collection Time: 07/24/21  6:00 PM   Specimen: Nasopharyngeal Swab; Nasopharyngeal(NP) swabs in vial transport medium  Result Value Ref Range Status   SARS Coronavirus 2 by RT PCR NEGATIVE NEGATIVE Final    Comment: (NOTE) SARS-CoV-2 target nucleic acids are NOT DETECTED.  The SARS-CoV-2 RNA is generally detectable in upper respiratory specimens during the acute phase of infection. The lowest concentration of SARS-CoV-2 viral copies this assay can detect is 138 copies/mL. A negative result does not preclude  SARS-Cov-2 infection and should not be used as the sole basis for treatment or other patient management decisions. A negative result may occur with  improper specimen collection/handling, submission of specimen other than nasopharyngeal swab, presence of viral mutation(s) within the areas targeted by this assay, and inadequate number of viral copies(<138 copies/mL). A negative result must be combined with clinical observations, patient history, and epidemiological information. The expected result is Negative.  Fact Sheet for Patients:  EntrepreneurPulse.com.au  Fact Sheet for Healthcare Providers:  IncredibleEmployment.be  This test is no t yet approved or cleared by the [place] FDA and  has been authorized for detection and/or diagnosis of SARS-CoV-2 by FDA under an Emergency Use Authorization (EUA). This EUA will remain  in effect (meaning this test can be used) for the duration of the COVID-19 declaration under Section 564(b)(1) of the Act, 21 U.S.C.section 360bbb-3(b)(1), unless the authorization is terminated  or revoked sooner.       Influenza A by PCR NEGATIVE NEGATIVE Final   Influenza B by PCR NEGATIVE NEGATIVE Final    Comment: (NOTE) The Xpert Xpress SARS-CoV-2/FLU/RSV plus assay is intended as an aid in the diagnosis of influenza from Nasopharyngeal swab specimens and should not be used as a sole basis for treatment. Nasal washings and aspirates are unacceptable for Xpert Xpress SARS-CoV-2/FLU/RSV testing.  Fact Sheet for Patients: EntrepreneurPulse.com.au  Fact Sheet for Healthcare Providers: IncredibleEmployment.be  This test is not yet approved or cleared by the [place] FDA and has been authorized for detection and/or diagnosis of SARS-CoV-2 by FDA under an Emergency Use Authorization (EUA). This EUA will remain in effect (meaning this test can be used) for the duration of  the COVID-19 declaration under Section 564(b)(1) of the Act, 21 U.S.C. section 360bbb-3(b)(1), unless the authorization is terminated or revoked.  Performed at [facility], [place] [address] [place], [place] [address]   Blood culture (routine x 2)     Status: None   Collection Time: 07/24/21  6:00 PM   Specimen: BLOOD  Result Value Ref Range Status   Specimen Description   Final    BLOOD RIGHT ANTECUBITAL Performed at [facility] [address], [place], [place] [address]    Special Requests   Final    BOTTLES DRAWN AEROBIC AND ANAEROBIC Blood Culture adequate volume Performed at [facility] [address] [place], [place] [address]    Culture  Final    NO GROWTH 5 DAYS Performed at [facility], [place] [address] [place], [place] [address]    Report Status 07/30/2021 FINAL  Final  Blood culture (routine x 2)     Status: None   Collection Time: 07/24/21  6:30 PM   Specimen: BLOOD  Result Value Ref Range Status   Specimen Description   Final    BLOOD LEFT ANTECUBITAL Performed at [facility] [address], [place], [place] [address]    Special Requests   Final    BOTTLES DRAWN AEROBIC AND ANAEROBIC Blood Culture adequate volume Performed at [facility] [address], [place], [place] [address]    Culture   Final    NO GROWTH 5 DAYS Performed at [facility], [place] [address], [place], [place] [address]    Report Status 07/30/2021 FINAL  Final  MRSA Next Gen by PCR, Nasal     Status: None   Collection Time: 07/25/21  2:59 PM   Specimen: Nasal Mucosa; Nasal Swab  Result Value Ref Range Status   MRSA by PCR Next Gen NOT DETECTED NOT DETECTED Final    Comment: (NOTE) The GeneXpert MRSA Assay (FDA approved for NASAL specimens only), is one component of a comprehensive MRSA colonization surveillance program. It is not intended to diagnose MRSA infection nor to  guide or monitor treatment for MRSA infections. Test performance is not FDA approved in patients less than 32 years old. Performed at [facility], [place] [address], [place],  [address]          Radiology Studies: No results found.      Scheduled Meds:  (feeding supplement) PROSource Plus  30 mL Oral Daily   budesonide (PULMICORT) nebulizer solution  0.5 mg Nebulization BID   buPROPion  150 mg Oral Daily   chlorhexidine  15 mL Mouth Rinse BID   Chlorhexidine Gluconate Cloth  6 each Topical Daily   enoxaparin (LOVENOX) injection  110 mg Subcutaneous Q12H   feeding supplement  237 mL Oral Q24H   fluticasone  2 spray Each Nare Daily   insulin aspart  0-15 Units Subcutaneous TID WC   insulin aspart  0-5 Units Subcutaneous QHS   insulin aspart  3 Units Subcutaneous TID WC   insulin glargine-yfgn  25 Units Subcutaneous QHS   ipratropium-albuterol  3 mL Inhalation TID   loratadine  10 mg Oral Daily   mouth rinse  15 mL Mouth Rinse BID   methylPREDNISolone (SOLU-MEDROL) injection  40 mg Intravenous Daily   multivitamin with minerals  1 tablet Oral Daily   oxybutynin  10 mg Oral Daily   pantoprazole  40 mg Oral Daily   simvastatin  20 mg Oral q1800   sodium chloride flush  3 mL Intravenous Q12H   tamsulosin  0.4 mg Oral BID   Continuous Infusions:     LOS: 10 days   Time spent= 35 mins    Marvalene Barrett Arsenio Loader, MD [facility]  If 7PM-7AM, please contact night-coverage  08/03/2021, 8:02 AM

## 2021-08-03 NOTE — Progress Notes (Signed)
Patients wife was notified that patient was being transferred to stepdown room 1238 at around 1600

## 2021-08-03 NOTE — Progress Notes (Signed)
Pt continues to be SOB even after relieving constipation. MD and RT made aware. PRN treatment given, RT increased o2 8L for SOB. Reviewed tele strips with centeral tele. pt with irregular HR tachy, p waves noted. Will update MD

## 2021-08-03 NOTE — Plan of Care (Signed)

## 2021-08-04 DIAGNOSIS — I2510 Atherosclerotic heart disease of native coronary artery without angina pectoris: Secondary | ICD-10-CM | POA: Diagnosis not present

## 2021-08-04 DIAGNOSIS — J9621 Acute and chronic respiratory failure with hypoxia: Secondary | ICD-10-CM | POA: Diagnosis not present

## 2021-08-04 DIAGNOSIS — E1169 Type 2 diabetes mellitus with other specified complication: Secondary | ICD-10-CM | POA: Diagnosis not present

## 2021-08-04 DIAGNOSIS — Z515 Encounter for palliative care: Secondary | ICD-10-CM | POA: Diagnosis not present

## 2021-08-04 DIAGNOSIS — I2699 Other pulmonary embolism without acute cor pulmonale: Secondary | ICD-10-CM | POA: Diagnosis not present

## 2021-08-04 LAB — BASIC METABOLIC PANEL
Anion gap: 6 (ref 5–15)
BUN: 21 mg/dL (ref 8–23)
CO2: 35 mmol/L — ABNORMAL HIGH (ref 22–32)
Calcium: 8 mg/dL — ABNORMAL LOW (ref 8.9–10.3)
Chloride: 96 mmol/L — ABNORMAL LOW (ref 98–111)
Creatinine, Ser: 1 mg/dL (ref 0.61–1.24)
GFR, Estimated: >60 mL/min (ref >60)
Glucose, Bld: 133 mg/dL — ABNORMAL HIGH (ref 70–99)
Potassium: 4.2 mmol/L (ref 3.5–5.1)
Sodium: 137 mmol/L (ref 135–145)

## 2021-08-04 LAB — GLUCOSE, CAPILLARY
Glucose-Capillary: 132 mg/dL — ABNORMAL HIGH (ref 70–99)
Glucose-Capillary: 148 mg/dL — ABNORMAL HIGH (ref 70–99)
Glucose-Capillary: 234 mg/dL — ABNORMAL HIGH (ref 70–99)
Glucose-Capillary: 163 mg/dL — ABNORMAL HIGH (ref 70–99)

## 2021-08-04 LAB — CBC
HCT: 35.6 % — ABNORMAL LOW (ref 39.0–52.0)
Hemoglobin: 11.4 g/dL — ABNORMAL LOW (ref 13.0–17.0)
MCH: 30.7 pg (ref 26.0–34.0)
MCHC: 32 g/dL (ref 30.0–36.0)
MCV: 96 fL (ref 80.0–100.0)
Platelets: 91 10*3/uL — ABNORMAL LOW (ref 150–400)
RBC: 3.71 MIL/uL — ABNORMAL LOW (ref 4.22–5.81)
RDW: 14.3 % (ref 11.5–15.5)
WBC: 6.8 10*3/uL (ref 4.0–10.5)
nRBC: 0 % (ref 0.0–0.2)

## 2021-08-04 LAB — MAGNESIUM: Magnesium: 2.2 mg/dL (ref 1.7–2.4)

## 2021-08-04 MED ORDER — CHLORHEXIDINE GLUCONATE 0.12 % MT SOLN
15.0000 mL | Freq: Two times a day (BID) | OROMUCOSAL | Status: DC
  Administered 2021-08-04 – 2021-08-25 (×37): 15 mL via OROMUCOSAL
  Filled 2021-08-04 (×37): qty 15

## 2021-08-04 MED ORDER — ORAL CARE MOUTH RINSE
15.0000 mL | Freq: Two times a day (BID) | OROMUCOSAL | Status: DC
  Administered 2021-08-05 – 2021-08-24 (×20): 15 mL via OROMUCOSAL

## 2021-08-04 NOTE — Progress Notes (Signed)
Pt has a large leak with his BIPAP but refuses to let RT or nurse to adjust mask to fix the problem. Pt is resting comfortable hr 87 rr 19 spo2 97% on BIPAP setting RT has attempted to fix mask twice tonight with no success

## 2021-08-04 NOTE — Progress Notes (Signed)
Daily Progress Note   Patient Name: Jacob Bishop       Date: 08/04/2021 DOB: November 15, 1940  Age: 80 y.o. MRN#: [id number] Attending Physician: Damita Lack, MD Primary Care Physician: [name], NP Admit Date: 07/24/2021  Reason for Consultation/Follow-up: Establishing goals of care  Subjective: Patient is much more awake alert, sitting up in bed, currently off BIPAP, now in stepdown unit.     Length of Stay: 11  Current Medications: Scheduled Meds:   (feeding supplement) PROSource Plus  30 mL Oral Daily   budesonide (PULMICORT) nebulizer solution  0.5 mg Nebulization BID   buPROPion  150 mg Oral Daily   chlorhexidine  15 mL Mouth Rinse BID   Chlorhexidine Gluconate Cloth  6 each Topical Daily   enoxaparin (LOVENOX) injection  110 mg Subcutaneous Q12H   feeding supplement  237 mL Oral Q24H   fluticasone  2 spray Each Nare Daily   insulin aspart  0-15 Units Subcutaneous TID WC   insulin aspart  0-5 Units Subcutaneous QHS   insulin aspart  3 Units Subcutaneous TID WC   insulin glargine-yfgn  15 Units Subcutaneous QHS   ipratropium-albuterol  3 mL Inhalation Q6H   loratadine  10 mg Oral Daily   mouth rinse  15 mL Mouth Rinse BID   multivitamin with minerals  1 tablet Oral Daily   oxybutynin  10 mg Oral Daily   pantoprazole  40 mg Oral Daily   predniSONE  40 mg Oral Q breakfast   simvastatin  20 mg Oral q1800   sodium chloride flush  3 mL Intravenous Q12H   tamsulosin  0.4 mg Oral BID    Continuous Infusions:   PRN Meds: acetaminophen **OR** acetaminophen, alum & mag hydroxide-simeth, calcium carbonate, guaiFENesin-dextromethorphan, ipratropium-albuterol, lip balm, LORazepam, polyethylene glycol, senna-docusate, sodium chloride  Physical Exam           No  distress Gets dyspneic easily No edema Has venous stasis changes both LE No focal deficits  Vital Signs: BP (!) 113/49   Pulse 87   Temp 98.5 F (36.9 C) (Oral)   Resp (!) 35   Ht 5\' 11"  (1.803 m)   Wt 106.2 kg   SpO2 96%   BMI 32.65 kg/m  SpO2: SpO2: 96 % O2 Device: O2 Device: Nasal Cannula O2 Flow Rate:  O2 Flow Rate (L/min): 6 L/min  Intake/output summary:  Intake/Output Summary (Last 24 hours) at 08/04/2021 1222 Last data filed at 08/04/2021 0947 Gross per 24 hour  Intake 123 ml  Output 1039 ml  Net -916 ml    LBM: Last BM Date: 08/03/21 Baseline Weight: Weight: 110 kg Most recent weight: Weight: 106.2 kg       Palliative Assessment/Data:      Patient Active Problem List   Diagnosis Date Noted   Palliative care by specialist    Chest pain 07/27/2021   Depression 07/25/2021   Pressure injury of skin 07/25/2021   Acute pulmonary embolus ([place]) 07/24/2021   Acute on chronic respiratory failure with hypoxia ([place]) 06/30/2021   DM type 2 with diabetic mixed hyperlipidemia ([place]) 06/30/2021   Mass of lung 06/27/2021   Lesion of liver 06/27/2021   Chronic respiratory failure with hypoxia ([place]) 06/27/2021   OA (osteoarthritis) of knee 10/04/2018   Other secondary pulmonary hypertension ([place]) 04/06/2018   Osteoarthritis, hand 05/05/2014   Carpal tunnel syndrome 05/02/2014   Eczema 01/23/2014   Thrombocytopenia ([place]) 11/19/2012   Overactive bladder 08/24/2012   Osteoarthritis of right knee 01/22/2012   Benign positional vertigo 07/09/2011   CAD, NATIVE VESSEL 10/22/2009   EDEMA 08/03/2009   Obstructive sleep apnea 05/03/2009   SKIN LESION 09/23/2007   Diabetes mellitus type 2, uncontrolled 03/23/2007   Hyperlipidemia 03/23/2007   Essential hypertension 03/23/2007   COLONIC POLYPS, HX OF 03/23/2007   Morbid obesity ([place]) 03/23/2007    Palliative Care Assessment & Plan   Patient Profile:    Assessment:  Acute PE CT imaging showing multiple pulmonary  nodules/masses as well as metastatic lesions in liver.  Has OSA DM, recent COVID-19 infection Functional decline Dyspnea on slight exertion   Recommendations/Plan: Continue current mode of care Consider skilled nursing facility for rehabilitation attempt with palliative services following. Ongoing goals of care discussions with patient and wife.  Full code for now.  Goals of Care and Additional Recommendations: Limitations on Scope of Treatment: Full Scope Treatment  Code Status:    Code Status Orders  (From admission, onward)           Start     Ordered   07/24/21 2305  Full code  Continuous        07/24/21 2306           Code Status History     Date Active Date Inactive Code Status Order ID Comments User Context   06/30/2021 0055 07/02/2021 2313 Full Code [id number]  Orene Desanctis, DO ED   10/04/2018 1538 10/11/2018 1930 Full Code [id number]  Gaynelle Arabian, MD Inpatient       Prognosis:  Unable to determine  Discharge Planning: To Be Determined  Care plan was discussed with  patient.   Thank you for allowing the Palliative Medicine Team to assist in the care of this patient.   Time In: 10 Time Out: 10.25 Total Time 25 Prolonged Time Billed  no       Greater than 50%  of this time was spent counseling and coordinating care related to the above assessment and plan.  Loistine Chance, MD  Please contact Palliative Medicine Team phone at [phone] for questions and concerns.

## 2021-08-04 NOTE — Progress Notes (Signed)
PROGRESS NOTE    Jacob Bishop  PPI:[id number] DOB: Jan 30, 1941 DOA: 07/24/2021 PCP: [name], NP   Brief Narrative:  80 year old with chronic hypoxic respiratory failure with COVID-19 infection, CAD, OSA, DM2, HTN, thrombocytopenia, liver and lung lesion concerning for metastatic disease admitted for shortness of breath found to have acute PE.  Due to metastatic lesion, IR consulted for biopsy.  Unable to perform liver biopsy as it was thought it could be hemangioma.  Patient needs lung biopsy once breathing is stabilized.   Palliative care team consulted.  Concerns of metastatic spread.  Patient has been seen by pulmonary team as well.  Due to worsening respiratory status he had to be placed on BiPAP and transferred to stepdown unit.   Assessment & Plan:   Principal Problem:   Acute pulmonary embolus ([facility]) Active Problems:   Obstructive sleep apnea   Essential hypertension   CAD, NATIVE VESSEL   Thrombocytopenia ([facility])   Mass of lung   Lesion of liver   Acute on chronic respiratory failure with hypoxia ([facility])   DM type 2 with diabetic mixed hyperlipidemia ([facility])   Depression   Pressure injury of skin   Chest pain   Palliative care by specialist  Acute respiratory distress with hypoxia Bilateral diffuse rhonchi - Worsening respiratory status yesterday therefore had to be transitioned on BiPAP.  Wean off as appropriate.  Not stable for lung biopsy but family is interested if his pulmonary status stabilizes. -Currently on oral prednisone - Bronchodilators, I-S/flutter.  Out of bed to chair. - BNP and procalcitonin negative  Acute pulmonary embolism, subsegmental - Lovenox 1 mg/kg every 12 hours.  Eventually transition to NOAC - Echocardiogram EF normal. - Lower extremity ultrasound showed right lower extremity DVT  COVID-19 infection - 06/29/21. Now out of isolation. Repeat test here is negative. Procal 0.21  Metastatic lesions in the lung and liver -Unknown primary, liver  lesions appears to be hemangioma, I have recommended CT liver protocol when more stable.  In terms of lung biopsy, he is unstable for this.  Seen by pulmonary, concerns of lymphangitic spread.  Overall poor prognosis.  Patient has now started showing interest in obtaining lung biopsy once his pulmonary status stabilizes.  Ongoing discussions with palliative care.  Diabetes mellitus type 2 - Reduce Semglee 15 units due to hypoglycemia.  Sliding scale and Accu-Cheks.    Thrombocytopenia -No evidence of bleeding.  Continue to monitor  Depression - Wellbutrin  Hyperlipidemia - Zocor  Obstructive sleep apnea - CPAP/BiPAP as needed  Over poor respiratory efforts.  Seen by palliative care team.  Will need ongoing discussions with patient and family as he is overall poor prognosis.  DVT prophylaxis: Lovenox  Code Status: Full code Family Communication:   Status is: Inpatient  Remains inpatient appropriate because: Still has significant amount of abnormal breath sounds.  He is not safe for discharge.       Nutritional status  Nutrition Problem: Increased nutrient needs Etiology: acute illness, chronic illness  Signs/Symptoms: estimated needs  Interventions: Ensure Enlive (each supplement provides 350kcal and 20 grams of protein), Prostat, MVI  Body mass index is 32.65 kg/m.  Pressure Injury 07/25/21 Buttocks Right Stage 2 -  Partial thickness loss of dermis presenting as a shallow open injury with a red, pink wound bed without slough. (Active)  07/25/21 1530  Location: Buttocks  Location Orientation: Right  Staging: Stage 2 -  Partial thickness loss of dermis presenting as a shallow open injury with a red, pink  wound bed without slough.  Wound Description (Comments):   Present on Admission: Yes     Pressure Injury 07/25/21 Buttocks Left Stage 2 -  Partial thickness loss of dermis presenting as a shallow open injury with a red, pink wound bed without slough. (Active)   07/25/21 1531  Location: Buttocks  Location Orientation: Left  Staging: Stage 2 -  Partial thickness loss of dermis presenting as a shallow open injury with a red, pink wound bed without slough.  Wound Description (Comments):   Present on Admission: Yes     Subjective: Yesterday afternoon patient had worsening of his respiratory status required to be placed on BiPAP and transferred to stepdown unit.  This morning he appears to be doing little better and has been weaned off BiPAP placed on 6 L nasal cannula.  Still has shallow breathing but respiratory rate is much better.  Patient states his breathing is improved since yesterday.  Examination: Constitutional: Not in acute distress, 6 L nasal cannula.  Appears chronically ill Respiratory: Diffuse bilateral rhonchi Cardiovascular: Normal sinus rhythm, no rubs Abdomen: Nontender nondistended good bowel sounds Musculoskeletal: No edema noted Skin: No rashes seen Neurologic: CN 2-12 grossly intact.  And nonfocal Psychiatric: Normal judgment and insight. Alert and oriented x 3. Normal mood.  Objective: Vitals:   08/04/21 0150 08/04/21 0356 08/04/21 0400 08/04/21 0410  BP:    (!) 113/49  Pulse:  82 77 87  Resp:  (!) 39 19 (!) 35  Temp:   97.7 F (36.5 C)   TempSrc:   Axillary   SpO2: 92% 98% 96% 98%  Weight:      Height:        Intake/Output Summary (Last 24 hours) at 08/04/2021 0734 Last data filed at 08/04/2021 0000 Gross per 24 hour  Intake 360 ml  Output 1039 ml  Net -679 ml   Filed Weights   07/25/21 1500 07/28/21 0414 08/03/21 1625  Weight: 110 kg 110 kg 106.2 kg     Data Reviewed:   CBC: Recent Labs  Lab 07/31/21 0406 08/01/21 0011 08/02/21 0445 08/03/21 0441 08/04/21 0329  WBC 5.8 5.8 8.0 7.5 6.8  HGB 11.5* 11.8* 11.7* 11.8* 11.4*  HCT 34.7* 35.7* 35.5* 36.6* 35.6*  MCV 92.8 92.5 93.7 95.1 96.0  PLT 115* 104* 110* 96* 91*   Basic Metabolic Panel: Recent Labs  Lab 07/31/21 0406 08/01/21 0011  08/02/21 0445 08/03/21 0441 08/04/21 0329  NA 134* 135 137 138 137  K 3.8 4.4 4.3 4.0 4.2  CL 94* 95* 95* 97* 96*  CO2 33* 36* 35* 34* 35*  GLUCOSE 109* 131* 119* 61* 133*  BUN 22 18 20 18 21   CREATININE 0.75 0.86 0.80 0.75 1.00  CALCIUM 8.0* 8.0* 8.3* 8.0* 8.0*  MG 2.5* 2.4 2.2 2.4 2.2   GFR: Estimated Creatinine Clearance: 75.5 mL/min (by C-G formula based on SCr of 1 mg/dL). Liver Function Tests: No results for input(s): AST, ALT, ALKPHOS, BILITOT, PROT, ALBUMIN in the last 168 hours.  No results for input(s): LIPASE, AMYLASE in the last 168 hours. No results for input(s): AMMONIA in the last 168 hours. Coagulation Profile: No results for input(s): INR, PROTIME in the last 168 hours.  Cardiac Enzymes: No results for input(s): CKTOTAL, CKMB, CKMBINDEX, TROPONINI in the last 168 hours. BNP (last 3 results) No results for input(s): PROBNP in the last 8760 hours. HbA1C: No results for input(s): HGBA1C in the last 72 hours. CBG: Recent Labs  Lab 08/03/21 [phone] 08/03/21 [phone]  08/03/21 1201 08/03/21 1630 08/03/21 2125  GLUCAP 60* 88 149* 180* 189*   Lipid Profile: No results for input(s): CHOL, HDL, LDLCALC, TRIG, CHOLHDL, LDLDIRECT in the last 72 hours. Thyroid Function Tests: No results for input(s): TSH, T4TOTAL, FREET4, T3FREE, THYROIDAB in the last 72 hours. Anemia Panel: No results for input(s): VITAMINB12, FOLATE, FERRITIN, TIBC, IRON, RETICCTPCT in the last 72 hours. Sepsis Labs: Recent Labs  Lab 07/29/21 0409 08/02/21 0445  PROCALCITON 0.21 <0.10    Recent Results (from the past 240 hour(s))  MRSA Next Gen by PCR, Nasal     Status: None   Collection Time: 07/25/21  2:59 PM   Specimen: Nasal Mucosa; Nasal Swab  Result Value Ref Range Status   MRSA by PCR Next Gen NOT DETECTED NOT DETECTED Final    Comment: (NOTE) The GeneXpert MRSA Assay (FDA approved for NASAL specimens only), is one component of a comprehensive MRSA colonization surveillance program.  It is not intended to diagnose MRSA infection nor to guide or monitor treatment for MRSA infections. Test performance is not FDA approved in patients less than 77 years old. Performed at [facility], [place] [address], [place], [place] [address]          Radiology Studies: [facility] Chest Port 1 View  Result Date: 08/03/2021 CLINICAL DATA:  Short of breath EXAM: PORTABLE CHEST 1 VIEW COMPARISON:  07/31/2021 FINDINGS: Hypoventilation with bibasilar atelectasis, unchanged. Numerous bilateral pulmonary nodules unchanged. Small pleural effusions. Negative for edema. IMPRESSION: Hypoventilation with bibasilar atelectasis and small effusions unchanged Numerous bilateral pulmonary nodules. Electronically Signed   By: Franchot Gallo M.D.   On: 08/03/2021 14:27        Scheduled Meds:  (feeding supplement) PROSource Plus  30 mL Oral Daily   budesonide (PULMICORT) nebulizer solution  0.5 mg Nebulization BID   buPROPion  150 mg Oral Daily   chlorhexidine  15 mL Mouth Rinse BID   Chlorhexidine Gluconate Cloth  6 each Topical Daily   enoxaparin (LOVENOX) injection  110 mg Subcutaneous Q12H   feeding supplement  237 mL Oral Q24H   fluticasone  2 spray Each Nare Daily   insulin aspart  0-15 Units Subcutaneous TID WC   insulin aspart  0-5 Units Subcutaneous QHS   insulin aspart  3 Units Subcutaneous TID WC   insulin glargine-yfgn  15 Units Subcutaneous QHS   ipratropium-albuterol  3 mL Inhalation Q6H   loratadine  10 mg Oral Daily   mouth rinse  15 mL Mouth Rinse BID   multivitamin with minerals  1 tablet Oral Daily   oxybutynin  10 mg Oral Daily   pantoprazole  40 mg Oral Daily   predniSONE  40 mg Oral Q breakfast   simvastatin  20 mg Oral q1800   sodium chloride flush  3 mL Intravenous Q12H   tamsulosin  0.4 mg Oral BID   Continuous Infusions:     LOS: 11 days   Time spent= 35 mins    Albie Arizpe Arsenio Loader, MD [facility]  If 7PM-7AM, please contact  night-coverage  08/04/2021, 7:34 AM

## 2021-08-04 NOTE — Progress Notes (Signed)
   08/04/21 1200  Clinical Encounter Type  Visited With Patient and family together  Visit Type Initial  Referral From Nurse  Consult/Referral To Chaplain  Spiritual Encounters  Spiritual Needs Sacred text;Prayer;Ritual;Emotional  Stress Factors  Patient Stress Factors Family relationships;Loss of control;Major life changes  Family Stress Factors Family relationships;Financial concerns;Health changes;Loss of control;Major life changes    Chaplain visited "Jacob Bishop: at bedside who was being visited by his wife, Leda Gauze, today.  Upon requesting prayer Chaplain provided support with emotional and spiritual distress.  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  Jacob Bishop appeared stressed and somewhat anxious  - often shaking his head is distress.  Leda Gauze spoke that they were feeling the enormity of stress of his two week hospitalization. Chaplain anointed Jacob Bishop and prayed with him at bedside.   Accepted Bible New Testament to read and thanks Chaplain for visiting and requested a follow up visit.  Chaplain ended visit with a departing blessing.

## 2021-08-04 NOTE — Progress Notes (Signed)
Nurse just taken patient off BIPAP and place back on 6lpm cann, Neb treatment given

## 2021-08-05 ENCOUNTER — Telehealth: Payer: Self-pay

## 2021-08-05 DIAGNOSIS — Z515 Encounter for palliative care: Secondary | ICD-10-CM | POA: Diagnosis not present

## 2021-08-05 DIAGNOSIS — E1169 Type 2 diabetes mellitus with other specified complication: Secondary | ICD-10-CM | POA: Diagnosis not present

## 2021-08-05 DIAGNOSIS — R079 Chest pain, unspecified: Secondary | ICD-10-CM | POA: Diagnosis not present

## 2021-08-05 DIAGNOSIS — I2699 Other pulmonary embolism without acute cor pulmonale: Secondary | ICD-10-CM | POA: Diagnosis not present

## 2021-08-05 DIAGNOSIS — J9621 Acute and chronic respiratory failure with hypoxia: Secondary | ICD-10-CM | POA: Diagnosis not present

## 2021-08-05 LAB — GLUCOSE, CAPILLARY
Glucose-Capillary: 127 mg/dL — ABNORMAL HIGH (ref 70–99)
Glucose-Capillary: 135 mg/dL — ABNORMAL HIGH (ref 70–99)
Glucose-Capillary: 204 mg/dL — ABNORMAL HIGH (ref 70–99)
Glucose-Capillary: 153 mg/dL — ABNORMAL HIGH (ref 70–99)

## 2021-08-05 LAB — BASIC METABOLIC PANEL
Anion gap: 7 (ref 5–15)
BUN: 21 mg/dL (ref 8–23)
CO2: 32 mmol/L (ref 22–32)
Calcium: 8 mg/dL — ABNORMAL LOW (ref 8.9–10.3)
Chloride: 95 mmol/L — ABNORMAL LOW (ref 98–111)
Creatinine, Ser: 0.88 mg/dL (ref 0.61–1.24)
GFR, Estimated: >60 mL/min (ref >60)
Glucose, Bld: 96 mg/dL (ref 70–99)
Potassium: 4.2 mmol/L (ref 3.5–5.1)
Sodium: 134 mmol/L — ABNORMAL LOW (ref 135–145)

## 2021-08-05 LAB — CBC
HCT: 35.8 % — ABNORMAL LOW (ref 39.0–52.0)
Hemoglobin: 11.5 g/dL — ABNORMAL LOW (ref 13.0–17.0)
MCH: 30.5 pg (ref 26.0–34.0)
MCHC: 32.1 g/dL (ref 30.0–36.0)
MCV: 95 fL (ref 80.0–100.0)
Platelets: 89 10*3/uL — ABNORMAL LOW (ref 150–400)
RBC: 3.77 MIL/uL — ABNORMAL LOW (ref 4.22–5.81)
RDW: 14.3 % (ref 11.5–15.5)
WBC: 6.4 10*3/uL (ref 4.0–10.5)
nRBC: 0 % (ref 0.0–0.2)

## 2021-08-05 LAB — MAGNESIUM: Magnesium: 2.2 mg/dL (ref 1.7–2.4)

## 2021-08-05 MED ORDER — IPRATROPIUM-ALBUTEROL 0.5-2.5 (3) MG/3ML IN SOLN
3.0000 mL | Freq: Three times a day (TID) | RESPIRATORY_TRACT | Status: DC
Start: 2021-08-05 — End: 2021-08-07
  Administered 2021-08-05 – 2021-08-07 (×7): 3 mL via RESPIRATORY_TRACT
  Filled 2021-08-05 (×7): qty 3

## 2021-08-05 NOTE — Progress Notes (Signed)
PROGRESS NOTE    Jacob Bishop  CLE:[id number] DOB: 1941-01-22 DOA: 07/24/2021 PCP: Jacob Peng, NP   Brief Narrative:  80 year old with chronic hypoxic respiratory failure with COVID-19 infection, CAD, OSA, DM2, HTN, thrombocytopenia, liver and lung lesion concerning for metastatic disease admitted for shortness of breath found to have acute PE.  Due to metastatic lesion, IR consulted for biopsy.  Unable to perform liver biopsy as it was thought it could be hemangioma.  Patient needs lung biopsy once breathing is stabilized.   Palliative care team consulted.  Concerns of metastatic spread.  Patient has been seen by pulmonary team as well.  Due to worsening respiratory status he had to be placed on BiPAP and transferred to stepdown unit.   Assessment & Plan:   Principal Problem:   Acute pulmonary embolus (HCC) Active Problems:   Obstructive sleep apnea   Essential hypertension   CAD, NATIVE VESSEL   Thrombocytopenia (HCC)   Mass of lung   Lesion of liver   Acute on chronic respiratory failure with hypoxia (HCC)   DM type 2 with diabetic mixed hyperlipidemia (HCC)   Depression   Pressure injury of skin   Chest pain   Palliative care by specialist  Acute respiratory distress with hypoxia Bilateral diffuse rhonchi - Patient is coming off of BiPAP, transition to supplemental oxygen. -Currently on oral prednisone - Bronchodilators, I-S/flutter.  Out of bed to chair. - BNP and procalcitonin negative  Acute pulmonary embolism, subsegmental - Lovenox 1 mg/kg every 12 hours.  Eventually transition to NOAC - Echocardiogram EF normal. - Lower extremity ultrasound showed right lower extremity DVT  COVID-19 infection - 06/29/21. Now out of isolation. Repeat test here is negative. Procal 0.21  Metastatic lesions in the lung and liver -Unknown primary, liver lesions appears to be hemangioma, I have recommended CT liver protocol when more stable.  Appears to have pulmonary/lymphangitic  spread.  Unfortunately patient is doing very poorly therefore unable to get any form of lung biopsy.  Seen by pulmonary team, palliative following.  Diabetes mellitus type 2 - Reduce Semglee 15 units due to hypoglycemia.  Sliding scale and Accu-Cheks.    Thrombocytopenia -No evidence of bleeding.  Continue to monitor  Depression - Wellbutrin  Hyperlipidemia - Zocor  Obstructive sleep apnea - CPAP/BiPAP as needed  Over poor respiratory efforts and poor prognosis.  Seen by palliative care team.    DVT prophylaxis: Lovenox  Code Status: Full code Family Communication:   Status is: Inpatient  Remains inpatient appropriate because: Still has significant amount of abnormal breath sounds.  He is not safe for discharge.       Nutritional status  Nutrition Problem: Increased nutrient needs Etiology: acute illness, chronic illness  Signs/Symptoms: estimated needs  Interventions: Ensure Enlive (each supplement provides 350kcal and 20 grams of protein), Prostat, MVI  Body mass index is 32.65 kg/m.  Pressure Injury 07/25/21 Buttocks Right Stage 2 -  Partial thickness loss of dermis presenting as a shallow open injury with a red, pink wound bed without slough. (Active)  07/25/21 1530  Location: Buttocks  Location Orientation: Right  Staging: Stage 2 -  Partial thickness loss of dermis presenting as a shallow open injury with a red, pink wound bed without slough.  Wound Description (Comments):   Present on Admission: Yes     Pressure Injury 07/25/21 Buttocks Left Stage 2 -  Partial thickness loss of dermis presenting as a shallow open injury with a red, pink wound bed without slough. (Active)  07/25/21 1531  Location: Buttocks  Location Orientation: Left  Staging: Stage 2 -  Partial thickness loss of dermis presenting as a shallow open injury with a red, pink wound bed without slough.  Wound Description (Comments):   Present on Admission: Yes     Subjective: Used BiPAP  overnight.  This morning appears to be on 7-8 L nasal cannula.  Gets tachycardic and short of breath with minimal movement. Patient understands why he is not a good candidate for lung biopsy at this time.  Examination: Constitutional: Appears chronically ill.  On 7 L of nasal cannula. Respiratory: Diffuse bilateral rhonchi Cardiovascular: Gets tachycardic with heart rate in 120s with minimal movement Abdomen: Nontender nondistended good bowel sounds Musculoskeletal: No edema noted Skin: No rashes seen Neurologic: CN 2-12 grossly intact.  And nonfocal Psychiatric: Normal judgment and insight. Alert and oriented x 3. Normal mood. Objective: Vitals:   08/05/21 0500 08/05/21 0600 08/05/21 0631 08/05/21 0637  BP:  (!) 122/47    Pulse:  79    Resp:  (!) 27    Temp:      TempSrc:      SpO2: 94% 92% 90% 95%  Weight:      Height:        Intake/Output Summary (Last 24 hours) at 08/05/2021 0750 Last data filed at 08/04/2021 2203 Gross per 24 hour  Intake 63 ml  Output 850 ml  Net -787 ml   Filed Weights   07/25/21 1500 07/28/21 0414 08/03/21 1625  Weight: 110 kg 110 kg 106.2 kg     Data Reviewed:   CBC: Recent Labs  Lab 08/01/21 0011 08/02/21 0445 08/03/21 0441 08/04/21 0329 08/05/21 0306  WBC 5.8 8.0 7.5 6.8 6.4  HGB 11.8* 11.7* 11.8* 11.4* 11.5*  HCT 35.7* 35.5* 36.6* 35.6* 35.8*  MCV 92.5 93.7 95.1 96.0 95.0  PLT 104* 110* 96* 91* 89*   Basic Metabolic Panel: Recent Labs  Lab 08/01/21 0011 08/02/21 0445 08/03/21 0441 08/04/21 0329 08/05/21 0306  NA 135 137 138 137 134*  K 4.4 4.3 4.0 4.2 4.2  CL 95* 95* 97* 96* 95*  CO2 36* 35* 34* 35* 32  GLUCOSE 131* 119* 61* 133* 96  BUN 18 20 18 21 21   CREATININE 0.86 0.80 0.75 1.00 0.88  CALCIUM 8.0* 8.3* 8.0* 8.0* 8.0*  MG 2.4 2.2 2.4 2.2 2.2   GFR: Estimated Creatinine Clearance: 85.8 mL/min (by C-G formula based on SCr of 0.88 mg/dL). Liver Function Tests: No results for input(s): AST, ALT, ALKPHOS, BILITOT,  PROT, ALBUMIN in the last 168 hours.  No results for input(s): LIPASE, AMYLASE in the last 168 hours. No results for input(s): AMMONIA in the last 168 hours. Coagulation Profile: No results for input(s): INR, PROTIME in the last 168 hours.  Cardiac Enzymes: No results for input(s): CKTOTAL, CKMB, CKMBINDEX, TROPONINI in the last 168 hours. BNP (last 3 results) No results for input(s): PROBNP in the last 8760 hours. HbA1C: No results for input(s): HGBA1C in the last 72 hours. CBG: Recent Labs  Lab 08/03/21 2125 08/04/21 0814 08/04/21 1139 08/04/21 1646 08/04/21 2108  GLUCAP 189* 132* 148* 234* 163*   Lipid Profile: No results for input(s): CHOL, HDL, LDLCALC, TRIG, CHOLHDL, LDLDIRECT in the last 72 hours. Thyroid Function Tests: No results for input(s): TSH, T4TOTAL, FREET4, T3FREE, THYROIDAB in the last 72 hours. Anemia Panel: No results for input(s): VITAMINB12, FOLATE, FERRITIN, TIBC, IRON, RETICCTPCT in the last 72 hours. Sepsis Labs: Recent Labs  Lab 08/02/21  [place] <0.10    No results found for this or any previous visit (from the past 240 hour(s)).        Radiology Studies: DG Chest Port 1 View  Result Date: 08/03/2021 CLINICAL DATA:  Short of breath EXAM: PORTABLE CHEST 1 VIEW COMPARISON:  07/31/2021 FINDINGS: Hypoventilation with bibasilar atelectasis, unchanged. Numerous bilateral pulmonary nodules unchanged. Small pleural effusions. Negative for edema. IMPRESSION: Hypoventilation with bibasilar atelectasis and small effusions unchanged Numerous bilateral pulmonary nodules. Electronically Signed   By: Franchot Gallo M.D.   On: 08/03/2021 14:27        Scheduled Meds:  (feeding supplement) PROSource Plus  30 mL Oral Daily   budesonide (PULMICORT) nebulizer solution  0.5 mg Nebulization BID   buPROPion  150 mg Oral Daily   chlorhexidine  15 mL Mouth Rinse BID   Chlorhexidine Gluconate Cloth  6 each Topical Daily   enoxaparin (LOVENOX)  injection  110 mg Subcutaneous Q12H   feeding supplement  237 mL Oral Q24H   fluticasone  2 spray Each Nare Daily   insulin aspart  0-15 Units Subcutaneous TID WC   insulin aspart  0-5 Units Subcutaneous QHS   insulin aspart  3 Units Subcutaneous TID WC   insulin glargine-yfgn  15 Units Subcutaneous QHS   ipratropium-albuterol  3 mL Inhalation Q6H   loratadine  10 mg Oral Daily   mouth rinse  15 mL Mouth Rinse q12n4p   multivitamin with minerals  1 tablet Oral Daily   oxybutynin  10 mg Oral Daily   pantoprazole  40 mg Oral Daily   predniSONE  40 mg Oral Q breakfast   simvastatin  20 mg Oral q1800   sodium chloride flush  3 mL Intravenous Q12H   tamsulosin  0.4 mg Oral BID   Continuous Infusions:     LOS: 12 days   Time spent= 35 mins    Jacob Lie Arsenio Loader, MD [facility]  If 7PM-7AM, please contact night-coverage  08/05/2021, 7:50 AM

## 2021-08-05 NOTE — Progress Notes (Signed)
Daily Progress Note   Patient Name: Jacob Bishop       Date: 08/05/2021 DOB: [birth year]  Age: 80 y.o. MRN#: [id number] Attending Physician: Damita Lack, MD Primary Care Physician: [name], NP Admit Date: 07/24/2021  Reason for Consultation/Follow-up: Establishing goals of care  Subjective: Patient is much more awake alert, sitting up in bed, currently off BIPAP,  in stepdown unit.  Patient gets dyspneic very easily, patient's wife Leda Gauze and stepdaughter Vania Rea arrived at the bedside, discussed with Dr. Valeta Harms earlier in the morning, goals of care discussions and CODE STATUS discussions undertaken, see below.    Length of Stay: 12  Current Medications: Scheduled Meds:   (feeding supplement) PROSource Plus  30 mL Oral Daily   budesonide (PULMICORT) nebulizer solution  0.5 mg Nebulization BID   buPROPion  150 mg Oral Daily   chlorhexidine  15 mL Mouth Rinse BID   Chlorhexidine Gluconate Cloth  6 each Topical Daily   enoxaparin (LOVENOX) injection  110 mg Subcutaneous Q12H   feeding supplement  237 mL Oral Q24H   fluticasone  2 spray Each Nare Daily   insulin aspart  0-15 Units Subcutaneous TID WC   insulin aspart  0-5 Units Subcutaneous QHS   insulin aspart  3 Units Subcutaneous TID WC   insulin glargine-yfgn  15 Units Subcutaneous QHS   ipratropium-albuterol  3 mL Inhalation Q6H   loratadine  10 mg Oral Daily   mouth rinse  15 mL Mouth Rinse q12n4p   multivitamin with minerals  1 tablet Oral Daily   oxybutynin  10 mg Oral Daily   pantoprazole  40 mg Oral Daily   predniSONE  40 mg Oral Q breakfast   simvastatin  20 mg Oral q1800   sodium chloride flush  3 mL Intravenous Q12H   tamsulosin  0.4 mg Oral BID    Continuous Infusions:   PRN Meds: acetaminophen **OR**  acetaminophen, alum & mag hydroxide-simeth, calcium carbonate, guaiFENesin-dextromethorphan, ipratropium-albuterol, lip balm, LORazepam, polyethylene glycol, senna-docusate, sodium chloride  Physical Exam           No distress Gets dyspneic easily No edema Has venous stasis changes both LE No focal deficits Noted to have hematuria Vital Signs: BP (!) 138/47 (BP Location: Left Arm)   Pulse (!) 169   Temp 98.6 F (37  C) (Axillary)   Resp (!) 26   Ht 5' 11"  (1.803 m)   Wt 106.2 kg   SpO2 93%   BMI 32.65 kg/m  SpO2: SpO2: 93 % O2 Device: O2 Device: Nasal Cannula O2 Flow Rate: O2 Flow Rate (L/min): 6 L/min  Intake/output summary:  Intake/Output Summary (Last 24 hours) at 08/05/2021 1249 Last data filed at 08/04/2021 2203 Gross per 24 hour  Intake 60 ml  Output 850 ml  Net -790 ml    LBM: Last BM Date: 08/03/21 Baseline Weight: Weight: 110 kg Most recent weight: Weight: 106.2 kg       Palliative Assessment/Data:      Patient Active Problem List   Diagnosis Date Noted   Palliative care by specialist    Chest pain 07/27/2021   Depression 07/25/2021   Pressure injury of skin 07/25/2021   Acute pulmonary embolus ([place]) 07/24/2021   Acute on chronic respiratory failure with hypoxia ([place]) 06/30/2021   DM type 2 with diabetic mixed hyperlipidemia ([place]) 06/30/2021   Mass of lung 06/27/2021   Lesion of liver 06/27/2021   Chronic respiratory failure with hypoxia ([place]) 06/27/2021   OA (osteoarthritis) of knee 10/04/2018   Other secondary pulmonary hypertension ([place]) 04/06/2018   Osteoarthritis, hand 05/05/2014   Carpal tunnel syndrome 05/02/2014   Eczema 01/23/2014   Thrombocytopenia ([place]) 11/19/2012   Overactive bladder 08/24/2012   Osteoarthritis of right knee 01/22/2012   Benign positional vertigo 07/09/2011   CAD, NATIVE VESSEL 10/22/2009   EDEMA 08/03/2009   Obstructive sleep apnea 05/03/2009   SKIN LESION 09/23/2007   Diabetes mellitus type 2, uncontrolled  03/23/2007   Hyperlipidemia 03/23/2007   Essential hypertension 03/23/2007   COLONIC POLYPS, HX OF 03/23/2007   Morbid obesity ([place]) 03/23/2007    Palliative Care Assessment & Plan   Patient Profile:    Assessment:  Acute PE CT imaging showing multiple pulmonary nodules/masses as well as metastatic lesions in liver.  Has OSA DM, recent COVID-19 infection Functional decline Dyspnea on slight exertion   Recommendations/Plan: Goals of care discussions: I met with the patient, patient's wife Leda Gauze and stepdaughter Vania Rea who arrived at the bedside for ongoing goals of care discussions.  Patient's family discussed with Dr. Valeta Harms earlier today about the patient's tenuous respiratory status and ongoing functional decline and the high likelihood of ongoing decline in decompensation. I introduced myself and palliative care as follows: Palliative medicine is specialized medical care for people living with serious illness. It focuses on providing relief from the symptoms and stress of a serious illness. The goal is to improve quality of life for both the patient and the family. Goals of care: Broad aims of medical therapy in relation to the patient's values and preferences. Our aim is to provide medical care aimed at enabling patients to achieve the goals that matter most to them, given the circumstances of their particular medical situation and their constraints.    Brief life review performed.  Goals wishes and values important to the patient and family as a unit attempted to be explored.  Discussed about the extensive disease burden and symptom burden stemming from acute pulmonary embolism, multiple pulmonary nodules/masses and resultant acute hypoxic hypercapnic respiratory failure.  Patient likely with baseline pulmonary hypertension due to OSA.  Ongoing physical deconditioning. Life limiting illness is believed to be widely metastatic disease to the lungs.  Ongoing discussions about CODE  STATUS.  Discussed extensively about full code versus DO NOT RESUSCITATE.  Offered medical recommendation for establishment of  DO NOT RESUSCITATE/DO NOT INTUBATE.  Discussed that this does not mean do not treat.  Offered active listening and supportive presence.  All of their questions addressed to the best of my ability. Plan: 1.  DNR/DNI 2.  Family request for chaplain assistance for completion of advance care planning documents particularly healthcare power of attorney agent.  Patient wishes to designate his wife Leda Gauze as his chosen [facility] POA agent. Palliative medicine team will continue to follow so as to observe overall disease trajectory of illness and current hospitalization course so as to be able to help assist with the further goals of care decisions and appropriate disposition planning.   Goals of Care and Additional Recommendations: Limitations on Scope of Treatment: Full Scope Treatment Only limitation is DO NOT RESUSCITATE/DO NOT INTUBATE. Code Status: After family meeting today [place] is revised as DNR/DNI as of 08-05-2021.    Code Status Orders  (From admission, onward)           Start     Ordered   07/24/21 2305  Full code  Continuous        07/24/21 2306           Code Status History     Date Active Date Inactive Code Status Order ID Comments User Context   06/30/2021 0055 07/02/2021 2313 Full Code [id number]  Orene Desanctis, DO ED   10/04/2018 1538 10/11/2018 1930 Full Code [id number]  Gaynelle Arabian, MD Inpatient       Prognosis:  Guarded   Discharge Planning: To Be Determined  Care plan was discussed with  patient wife and daughter.    Thank you for allowing the Palliative Medicine Team to assist in the care of this patient.   Time In: 12 Time Out: 12.40 Total Time 40 Prolonged Time Billed  no       Greater than 50%  of this time was spent counseling and coordinating care related to the above assessment and plan.  Loistine Chance, MD  Please contact  Palliative Medicine Team phone at [phone] for questions and concerns.

## 2021-08-05 NOTE — Progress Notes (Signed)
NAME:  Jacob Bishop, MRN:  [id number], DOB:  05-26-1941, LOS: 12 ADMISSION DATE:  07/24/2021, CONSULTATION DATE:  12/5 REFERRING MD:  Reesa Chew, CHIEF COMPLAINT:  Pulmonary nodules   History of Present Illness:  80 y/o male with multiple medical problems found to have radiographic findings worrisome for metastatic malignancy when he was admitted for COVID 19.  He was re-admitted for hypoxemia in the setting of pulmonary embolism.  PCCM consulted for consideration of biopsy of his pulmonary nodules.   Pertinent  Medical History  AI Arthritis CAD DM2 Diverticulosis Hyperlipidemia Hypertension OSA Pulmonary hypertension   Significant Hospital Events: Including procedures, antibiotic start and stop dates in addition to other pertinent events   11/30 presented with dyspnea, admit to hospitalists, found to have segmental, acute PE; CT chest with multiple pulmonary nodules throughout lungs, lesions in liver Echo 07/25/21 TDS but no obvious RV or LV issues 12/12 - SDU for PRN BIPAP  Scheduled Meds:  (feeding supplement) PROSource Plus  30 mL Oral Daily   budesonide (PULMICORT) nebulizer solution  0.5 mg Nebulization BID   buPROPion  150 mg Oral Daily   chlorhexidine  15 mL Mouth Rinse BID   Chlorhexidine Gluconate Cloth  6 each Topical Daily   enoxaparin (LOVENOX) injection  110 mg Subcutaneous Q12H   feeding supplement  237 mL Oral Q24H   fluticasone  2 spray Each Nare Daily   insulin aspart  0-15 Units Subcutaneous TID WC   insulin aspart  0-5 Units Subcutaneous QHS   insulin aspart  3 Units Subcutaneous TID WC   insulin glargine-yfgn  15 Units Subcutaneous QHS   ipratropium-albuterol  3 mL Inhalation Q6H   loratadine  10 mg Oral Daily   mouth rinse  15 mL Mouth Rinse q12n4p   multivitamin with minerals  1 tablet Oral Daily   oxybutynin  10 mg Oral Daily   pantoprazole  40 mg Oral Daily   predniSONE  40 mg Oral Q breakfast   simvastatin  20 mg Oral q1800   sodium chloride flush  3  mL Intravenous Q12H   tamsulosin  0.4 mg Oral BID   Continuous Infusions: PRN Meds:.acetaminophen **OR** acetaminophen, alum & mag hydroxide-simeth, calcium carbonate, guaiFENesin-dextromethorphan, ipratropium-albuterol, lip balm, LORazepam, polyethylene glycol, senna-docusate, sodium chloride    Interim History / Subjective:   Sitting up in bed. Conversant.   Objective   Blood pressure (!) 124/58, pulse (!) 109, temperature 98.1 F (36.7 C), temperature source Oral, resp. rate (!) 31, height 5\' 11"  (1.803 m), weight 106.2 kg, SpO2 94 %.    FiO2 (%):  [40 %] 40 %   Intake/Output Summary (Last 24 hours) at 08/05/2021 0943 Last data filed at 08/04/2021 2203 Gross per 24 hour  Intake 63 ml  Output 850 ml  Net -787 ml   Filed Weights   07/25/21 1500 07/28/21 0414 08/03/21 1625  Weight: 110 kg 110 kg 106.2 kg    Examination: General appearance: 80 y.o., male, NAD, conversant  Eyes: tracking appropriately  HENT: no wheeze  Lungs: no crackles, diminished BL  CV: RRR, S1, S2 Abdomen: Soft, non-tender; non-distended, BS present  Extremities: No peripheral edema, radial and DP pulses present bilaterally  Skin: Normal temperature, turgor and texture; no rash Neuro: Alert and oriented to person and place, no focal deficit     Resolved Hospital Problem list     Assessment & Plan:   Acute pulmonary embolism Multiple pulmonary nodules/masses Acute hypoxemic and hypercarbic  respiratory failure due to  pulmonary masses and pulmonary embolism Baseline pulmonary hypertension OSA Obesity Physical deconditioning Sinus congestion  Overall, situation is poor. He appears to have widely metastatic disease to the lungs. He is in no condition to under procedural intervention at this time. It will unlikely change his outcome as his functional status is not equip to undergo systemic chemo nor is the patient interested in that option after having a prolonged conversation at bedside this  morning.   P: I will discuss with [facility] and Palliative care teams.  My recommendation is inpatient transition to comfort care measures  I suspect his hypoxemia is primarily driven by his diffuse parenchymal disease related to underlying malignancy complicated by his recent covid-19 and insult by pulmonary embolism I explained to the wife that the patient having to stop his AC in the setting of recent PE with DVT as well as going on a ventilator for a procedure would pose high risk for th need of vent support after the case and they due to his current status he would likely end up remaining on the vent. She and her husband both stated that they would not want that. We will continue to help support this situation as needed.      Best Practice (right click and "Reselect all SmartList Selections" daily)  Per [facility]  Labs   CBC: Recent Labs  Lab 08/01/21 0011 08/02/21 0445 08/03/21 0441 08/04/21 0329 08/05/21 0306  WBC 5.8 8.0 7.5 6.8 6.4  HGB 11.8* 11.7* 11.8* 11.4* 11.5*  HCT 35.7* 35.5* 36.6* 35.6* 35.8*  MCV 92.5 93.7 95.1 96.0 95.0  PLT 104* 110* 96* 91* 89*    Basic Metabolic Panel: Recent Labs  Lab 08/01/21 0011 08/02/21 0445 08/03/21 0441 08/04/21 0329 08/05/21 0306  NA 135 137 138 137 134*  K 4.4 4.3 4.0 4.2 4.2  CL 95* 95* 97* 96* 95*  CO2 36* 35* 34* 35* 32  GLUCOSE 131* 119* 61* 133* 96  BUN 18 20 18 21 21   CREATININE 0.86 0.80 0.75 1.00 0.88  CALCIUM 8.0* 8.3* 8.0* 8.0* 8.0*  MG 2.4 2.2 2.4 2.2 2.2   GFR: Estimated Creatinine Clearance: 85.8 mL/min (by C-G formula based on SCr of 0.88 mg/dL). Recent Labs  Lab 08/02/21 0445 08/03/21 0441 08/04/21 0329 08/05/21 0306  PROCALCITON <0.10  --   --   --   WBC 8.0 7.5 6.8 6.4    Liver Function Tests: No results for input(s): AST, ALT, ALKPHOS, BILITOT, PROT, ALBUMIN in the last 168 hours.  No results for input(s): LIPASE, AMYLASE in the last 168 hours. No results for input(s): AMMONIA in the last 168  hours.  ABG    Component Value Date/Time   PHART 7.424 08/03/2021 1328   PCO2ART 53.8 (H) 08/03/2021 1328   PO2ART 103 08/03/2021 1328   HCO3 34.5 (H) 08/03/2021 1328   O2SAT 98.4 08/03/2021 1328     Coagulation Profile: No results for input(s): INR, PROTIME in the last 168 hours.   Cardiac Enzymes: No results for input(s): CKTOTAL, CKMB, CKMBINDEX, TROPONINI in the last 168 hours.  HbA1C: HbA1c, POC (controlled diabetic range)  Date/Time Value Ref Range Status  07/13/2018 03:19 PM 6.0 0.0 - 7.0 % Final   Hgb A1c MFr Bld  Date/Time Value Ref Range Status  07/01/2021 04:42 AM 6.2 (H) 4.8 - 5.6 % Final    Comment:    (NOTE) Pre diabetes:          5.7%-6.4%  Diabetes:              >  6.4%  Glycemic control for   <7.0% adults with diabetes   06/30/2021 02:50 AM 6.0 (H) 4.8 - 5.6 % Final    Comment:    (NOTE) Pre diabetes:          5.7%-6.4%  Diabetes:              >6.4%  Glycemic control for   <7.0% adults with diabetes     CBG: Recent Labs  Lab 08/04/21 0814 08/04/21 1139 08/04/21 1646 08/04/21 2108 08/05/21 0753  GLUCAP 132* 148* 234* 163* McFall, DO [facility] 08/05/2021 9:43 AM

## 2021-08-05 NOTE — Plan of Care (Signed)
PCCM Goals of Care Discussion and Advanced Care Planning:   Date: 08/05/2021   Present Parties: wife and patient   What was discussed:   Outcome: Current medical comorbidity, current o2 needs, risks of undergoing any kind of procedure. I also discussed case with Dr. Reesa Chew and Dr. Rowe Pavy.   Continue [facility] discussions.   20 mins of time was spent discussing the goals of care, advanced care planning options such as code status as well as do not resuscitate forms.  Jacob Nash, DO [facility] 08/05/2021 9:55 AM

## 2021-08-05 NOTE — Telephone Encounter (Signed)
Wife of patient called stating patient is in the hospital and is very sick room # [place] [phone]

## 2021-08-05 NOTE — Progress Notes (Signed)
   08/05/21 1600  Clinical Encounter Type  Visited With Health care provider  Visit Type Follow-up;Psychological support;Spiritual support;Social support  Referral From Nurse  Consult/Referral To Chaplain  Spiritual Encounters  Spiritual Needs Emotional  Stress Factors  Patient Stress Factors None identified  Family Stress Factors None identified   Met with staff RN Neoma Laming to provide Advanced Directives paperwork for family.  Family have left for day and will follow up in morning regarding this task.

## 2021-08-05 NOTE — Progress Notes (Signed)
PCCM:  I met with patients wife, daughter and patient in the room. We reviewed his CT chest imaging. We appreciate the help from palliative care team. We are all in agreement with the plan.   DNR code status changed by Dr. Rowe Pavy.   CCM will sign off at this time.   Garner Nash, DO [facility] 08/05/2021 2:47 PM

## 2021-08-05 NOTE — Progress Notes (Signed)
PT Cancellation Note  Patient Details Name: DEJAY KRONK MRN: [id number] DOB: 25-Jul-1941   Cancelled Treatment:    Reason Eval/Treat Not Completed: Other (comment)GOC meeting, Spiritual care meeting. Will check back tomorrow .   Claretha Cooper 08/05/2021, 2:10 PM  Tresa Endo PT Acute Rehabilitation Services Pager [phone] Office [phone]

## 2021-08-05 NOTE — Progress Notes (Signed)
   08/05/21 1300  Clinical Encounter Type  Visited With Patient and family together  Visit Type Follow-up  Referral From Nurse  Consult/Referral To Chaplain  Spiritual Encounters  Spiritual Needs Prayer;Emotional  Stress Factors  Patient Stress Factors Family relationships;Health changes;Loss of control;Major life changes  Family Stress Factors Family relationships;Loss of control;Major life changes    Chaplain made follow up visit for patient who was being visited by his wife and step daughter.  Provided follow up spiritual care and emotional counseling to patient and entire family.  Pt spoke of his need to rest and relax with all his medical concerns, family expressed their emotions regarding sharing decision making about advanced directives, etc.  Provided support to all, prayed with patient and family at bedside.  Will remain available as needed

## 2021-08-06 DIAGNOSIS — Z7189 Other specified counseling: Secondary | ICD-10-CM

## 2021-08-06 LAB — GLUCOSE, CAPILLARY
Glucose-Capillary: 114 mg/dL — ABNORMAL HIGH (ref 70–99)
Glucose-Capillary: 244 mg/dL — ABNORMAL HIGH (ref 70–99)
Glucose-Capillary: 219 mg/dL — ABNORMAL HIGH (ref 70–99)
Glucose-Capillary: 173 mg/dL — ABNORMAL HIGH (ref 70–99)

## 2021-08-06 LAB — CBC
HCT: 35.1 % — ABNORMAL LOW (ref 39.0–52.0)
Hemoglobin: 11.5 g/dL — ABNORMAL LOW (ref 13.0–17.0)
MCH: 30.8 pg (ref 26.0–34.0)
MCHC: 32.8 g/dL (ref 30.0–36.0)
MCV: 94.1 fL (ref 80.0–100.0)
Platelets: 96 10*3/uL — ABNORMAL LOW (ref 150–400)
RBC: 3.73 MIL/uL — ABNORMAL LOW (ref 4.22–5.81)
RDW: 14.3 % (ref 11.5–15.5)
WBC: 7.4 10*3/uL (ref 4.0–10.5)
nRBC: 0 % (ref 0.0–0.2)

## 2021-08-06 LAB — BASIC METABOLIC PANEL
Anion gap: 7 (ref 5–15)
BUN: 20 mg/dL (ref 8–23)
CO2: 33 mmol/L — ABNORMAL HIGH (ref 22–32)
Calcium: 8.3 mg/dL — ABNORMAL LOW (ref 8.9–10.3)
Chloride: 98 mmol/L (ref 98–111)
Creatinine, Ser: 0.87 mg/dL (ref 0.61–1.24)
GFR, Estimated: >60 mL/min (ref >60)
Glucose, Bld: 118 mg/dL — ABNORMAL HIGH (ref 70–99)
Potassium: 5.1 mmol/L (ref 3.5–5.1)
Sodium: 138 mmol/L (ref 135–145)

## 2021-08-06 LAB — MAGNESIUM: Magnesium: 2.2 mg/dL (ref 1.7–2.4)

## 2021-08-06 MED ORDER — MORPHINE SULFATE (PF) 2 MG/ML IV SOLN: 1.0000 mg | INTRAVENOUS | Status: DC | PRN

## 2021-08-06 NOTE — Progress Notes (Signed)
PT Cancellation Note  Patient Details Name: Jacob Bishop MRN: [id number] DOB: 1940/09/18   Cancelled Treatment:    Reason Eval/Treat Not Completed: Medical issues which prohibited therapy, on biPAP per Rn. Will try PT tomorrow.   Nazeer [place] Pager [phone] Office [phone]  08/06/2021, 3:37 PM

## 2021-08-06 NOTE — Progress Notes (Signed)
Chaplain engaged in an initial visit with Jacob Bishop, who also had a friend present in the room.  Kostantinos spoke about wanting to assign his wife as his healthcare agent.  Chaplain explained that because they are legally married they already have a binding agreement for medical staff to contact his wife first.  Bonney Roussel let him know she would check back in with his wife later on today.   Chaplain offered support.     08/06/21 1000  Clinical Encounter Type  Visited With Patient  Visit Type Social support;Initial

## 2021-08-06 NOTE — Progress Notes (Signed)
PROGRESS NOTE    Jacob Bishop  FYB:[id number] DOB: 1941-04-07 DOA: 07/24/2021 PCP: [name], NP   Brief Narrative:  80 year old with chronic hypoxic respiratory failure with COVID-19 infection, CAD, OSA, DM2, HTN, thrombocytopenia, liver and lung lesion concerning for metastatic disease admitted for shortness of breath found to have acute PE.  Due to metastatic lesion, IR consulted for biopsy.  Unable to perform liver biopsy as it was thought it could be hemangioma.  Patient needs lung biopsy once breathing is stabilized.   Palliative care team consulted.  Concerns of metastatic spread.  Patient has been seen by pulmonary team as well.  Due to worsening respiratory status he had to be placed on BiPAP and transferred to stepdown unit.  Patient has now been made DNR/DNI.   Assessment & Plan:   Principal Problem:   Acute pulmonary embolus (HCC) Active Problems:   Obstructive sleep apnea   Essential hypertension   CAD, NATIVE VESSEL   Thrombocytopenia (HCC)   Mass of lung   Lesion of liver   Acute on chronic respiratory failure with hypoxia (HCC)   DM type 2 with diabetic mixed hyperlipidemia (HCC)   Depression   Pressure injury of skin   Chest pain   Palliative care by specialist  Acute respiratory distress with hypoxia Bilateral diffuse rhonchi - Unfortunately his breathing continues to decline requiring as needed BiPAP and supplemental oxygen.  Maintain stepdown unit.  Currently on oral prednisone.  He is also have very high risk for aspiration. - Bronchodilators, I-S/flutter.  Out of bed to chair. - BNP and procalcitonin negative  Acute pulmonary embolism, subsegmental - Lovenox 1 mg/kg every 12 hours.  Eventually transition to NOAC - Echocardiogram EF normal. - Lower extremity ultrasound showed right lower extremity DVT  Hematuria - Hemoglobin is stable but given his pulmonary embolism, would not stop his anticoagulation for now.  COVID-19 infection - 06/29/21. Now  out of isolation. Repeat test here is negative. Procal 0.21  Metastatic lesions in the lung and liver -Unknown primary, liver lesions appears to be hemangioma, I have recommended CT liver protocol when more stable.  Appears to have pulmonary/lymphangitic spread.  Unfortunately he is doing very poorly and not a good candidate for any form of further diagnostic work-up.  Patient is agreeable to this.  Overall very poor prognosis.  Diabetes mellitus type 2 - Semglee 15 units due to hypoglycemia.  Sliding scale and Accu-Cheks.    Thrombocytopenia -No evidence of bleeding.  Continue to monitor  Depression - Wellbutrin  Hyperlipidemia - Zocor  Obstructive sleep apnea - CPAP/BiPAP as needed  Over poor respiratory efforts and poor prognosis.  Seen by palliative care team.  Patient is now willing to transition to full comfort care yet.    DVT prophylaxis: Lovenox  Code Status: DNR/DNI Family Communication:   Status is: Inpatient  Remains inpatient appropriate because: Still has significant amount of abnormal breath sounds.  He is not safe for discharge.    Nutritional status  Nutrition Problem: Increased nutrient needs Etiology: acute illness, chronic illness  Signs/Symptoms: estimated needs  Interventions: Ensure Enlive (each supplement provides 350kcal and 20 grams of protein), Prostat, MVI  Body mass index is 32.65 kg/m.  Pressure Injury 07/25/21 Buttocks Right Stage 2 -  Partial thickness loss of dermis presenting as a shallow open injury with a red, pink wound bed without slough. (Active)  07/25/21 1530  Location: Buttocks  Location Orientation: Right  Staging: Stage 2 -  Partial thickness loss of dermis  presenting as a shallow open injury with a red, pink wound bed without slough.  Wound Description (Comments):   Present on Admission: Yes     Pressure Injury 07/25/21 Buttocks Left Stage 2 -  Partial thickness loss of dermis presenting as a shallow open injury with a  red, pink wound bed without slough. (Active)  07/25/21 1531  Location: Buttocks  Location Orientation: Left  Staging: Stage 2 -  Partial thickness loss of dermis presenting as a shallow open injury with a red, pink wound bed without slough.  Wound Description (Comments):   Present on Admission: Yes     Subjective: He states he likes wearing BiPAP and doing okay with it he understands he is not making much improvement Examination: Constitutional: Not in acute distress, on 6 L nasal cannula Respiratory: Bilateral diminished breath sounds, shallow breathing Cardiovascular: Normal sinus rhythm, no rubs Abdomen: Nontender nondistended good bowel sounds Musculoskeletal: No edema noted Skin: No rashes seen Neurologic: CN 2-12 grossly intact.  And nonfocal Psychiatric: Normal judgment and insight. Alert and oriented x 3. Normal mood.  Hematuria noted in the canister  Objective: Vitals:   08/06/21 0400 08/06/21 0800 08/06/21 0818 08/06/21 0847  BP: 108/63 (!) 114/42    Pulse: 69 76 (!) 103   Resp: 16 (!) 25 (!) 26   Temp:   98 F (36.7 C)   TempSrc:   Axillary   SpO2: 96% 96% 91% 92%  Weight:      Height:        Intake/Output Summary (Last 24 hours) at 08/06/2021 1113 Last data filed at 08/06/2021 0600 Gross per 24 hour  Intake --  Output 1300 ml  Net -1300 ml   Filed Weights   07/25/21 1500 07/28/21 0414 08/03/21 1625  Weight: 110 kg 110 kg 106.2 kg     Data Reviewed:   CBC: Recent Labs  Lab 08/02/21 0445 08/03/21 0441 08/04/21 0329 08/05/21 0306 08/06/21 0317  WBC 8.0 7.5 6.8 6.4 7.4  HGB 11.7* 11.8* 11.4* 11.5* 11.5*  HCT 35.5* 36.6* 35.6* 35.8* 35.1*  MCV 93.7 95.1 96.0 95.0 94.1  PLT 110* 96* 91* 89* 96*   Basic Metabolic Panel: Recent Labs  Lab 08/02/21 0445 08/03/21 0441 08/04/21 0329 08/05/21 0306 08/06/21 0317  NA 137 138 137 134* 138  K 4.3 4.0 4.2 4.2 5.1  CL 95* 97* 96* 95* 98  CO2 35* 34* 35* 32 33*  GLUCOSE 119* 61* 133* 96 118*  BUN  20 18 21 21 20   CREATININE 0.80 0.75 1.00 0.88 0.87  CALCIUM 8.3* 8.0* 8.0* 8.0* 8.3*  MG 2.2 2.4 2.2 2.2 2.2   GFR: Estimated Creatinine Clearance: 86.8 mL/min (by C-G formula based on SCr of 0.87 mg/dL). Liver Function Tests: No results for input(s): AST, ALT, ALKPHOS, BILITOT, PROT, ALBUMIN in the last 168 hours.  No results for input(s): LIPASE, AMYLASE in the last 168 hours. No results for input(s): AMMONIA in the last 168 hours. Coagulation Profile: No results for input(s): INR, PROTIME in the last 168 hours.  Cardiac Enzymes: No results for input(s): CKTOTAL, CKMB, CKMBINDEX, TROPONINI in the last 168 hours. BNP (last 3 results) No results for input(s): PROBNP in the last 8760 hours. HbA1C: No results for input(s): HGBA1C in the last 72 hours. CBG: Recent Labs  Lab 08/05/21 0753 08/05/21 1105 08/05/21 1658 08/05/21 2144 08/06/21 0804  GLUCAP 127* 135* 204* 153* 114*   Lipid Profile: No results for input(s): CHOL, HDL, LDLCALC, TRIG, CHOLHDL, LDLDIRECT in  the last 72 hours. Thyroid Function Tests: No results for input(s): TSH, T4TOTAL, FREET4, T3FREE, THYROIDAB in the last 72 hours. Anemia Panel: No results for input(s): VITAMINB12, FOLATE, FERRITIN, TIBC, IRON, RETICCTPCT in the last 72 hours. Sepsis Labs: Recent Labs  Lab 08/02/21 0445  PROCALCITON <0.10    No results found for this or any previous visit (from the past 240 hour(s)).        Radiology Studies: No results found.      Scheduled Meds:  (feeding supplement) PROSource Plus  30 mL Oral Daily   budesonide (PULMICORT) nebulizer solution  0.5 mg Nebulization BID   buPROPion  150 mg Oral Daily   chlorhexidine  15 mL Mouth Rinse BID   Chlorhexidine Gluconate Cloth  6 each Topical Daily   enoxaparin (LOVENOX) injection  110 mg Subcutaneous Q12H   feeding supplement  237 mL Oral Q24H   fluticasone  2 spray Each Nare Daily   insulin aspart  0-15 Units Subcutaneous TID WC   insulin aspart   0-5 Units Subcutaneous QHS   insulin aspart  3 Units Subcutaneous TID WC   insulin glargine-yfgn  15 Units Subcutaneous QHS   ipratropium-albuterol  3 mL Inhalation TID   loratadine  10 mg Oral Daily   mouth rinse  15 mL Mouth Rinse q12n4p   multivitamin with minerals  1 tablet Oral Daily   oxybutynin  10 mg Oral Daily   pantoprazole  40 mg Oral Daily   predniSONE  40 mg Oral Q breakfast   simvastatin  20 mg Oral q1800   sodium chloride flush  3 mL Intravenous Q12H   tamsulosin  0.4 mg Oral BID   Continuous Infusions:     LOS: 13 days   Time spent= 35 mins    Nalah Macioce Arsenio Loader, MD [facility]  If 7PM-7AM, please contact night-coverage  08/06/2021, 11:13 AM

## 2021-08-06 NOTE — Evaluation (Signed)
SLP Cancellation Note  Patient Details Name: Jacob Bishop MRN: [id number] DOB: [birth year]   Cancelled treatment:       Reason Eval/Treat Not Completed: Other (comment) (social Financial controller arrived to see pt, will follow up later today.)   Jacob Bishop 08/06/2021, 10:29 AM Jacob Lime, MS [place] Office [phone] Pager [phone]

## 2021-08-06 NOTE — Plan of Care (Signed)
PT tolerating Bipap tonight.  Denies pain.  Continues on Tele.  Reports appetite is improve.  Urinary output adequate.  Problem: Education: Goal: Knowledge of General Education information will improve Description: Including pain rating scale, medication(s)/side effects and non-pharmacologic comfort measures Outcome: Progressing   Problem: Health Behavior/Discharge Planning: Goal: Ability to manage health-related needs will improve Outcome: Progressing   Problem: Clinical Measurements: Goal: Ability to maintain clinical measurements within normal limits will improve Outcome: Progressing Goal: Will remain free from infection Outcome: Progressing Goal: Diagnostic test results will improve Outcome: Progressing Goal: Respiratory complications will improve Outcome: Progressing Goal: Cardiovascular complication will be avoided Outcome: Progressing   Problem: Activity: Goal: Risk for activity intolerance will decrease Outcome: Progressing   Problem: Nutrition: Goal: Adequate nutrition will be maintained Outcome: Progressing   Problem: Coping: Goal: Level of anxiety will decrease Outcome: Progressing   Problem: Elimination: Goal: Will not experience complications related to bowel motility Outcome: Progressing Goal: Will not experience complications related to urinary retention Outcome: Progressing   Problem: Pain Managment: Goal: General experience of comfort will improve Outcome: Progressing   Problem: Safety: Goal: Ability to remain free from injury will improve Outcome: Progressing   Problem: Skin Integrity: Goal: Risk for impaired skin integrity will decrease Outcome: Progressing

## 2021-08-06 NOTE — Progress Notes (Signed)
Over previous shifts, patient has refused turns-Patient stated he has been shifting himself in the bed. Patient educated of the risks of not turning in regards to skin breakdown.

## 2021-08-06 NOTE — Telephone Encounter (Signed)
FYI Spoke to pt spouse and she stated that pt may not make it. Pt spouse stated pt got Covid a month ago and has been in and out of the hospital since. She also advised that when PCP found pneumonia he was sent to the hospital and they found cancer. She wanted to let Jacob Bishop know what was going on with pt.

## 2021-08-06 NOTE — Progress Notes (Signed)
Healthcare Power of Attorney completed- 3 sets given to wife-Marilyn- and two copies placed in chart.

## 2021-08-06 NOTE — TOC Progression Note (Signed)
Transition of Care [facility]) - Progression Note    Patient Details  Name: Jacob Bishop MRN: [id number] Date of Birth: Jul 24, 1941  Transition of Care [facility]) CM/SW Contact  Ross Ludwig, [place] Phone Number: 08/06/2021, 3:58 PM  Clinical Narrative:    CSW to continue to follow patient's progress throughout discharge planning.  Patient is currently active with [facility] for home health.   Expected Discharge Plan: [place] Barriers to Discharge: No Barriers Identified  Expected Discharge Plan and Services Expected Discharge Plan: [place] arrangements for the past 2 months: Single Family Home                                       Social Determinants of Health (SDOH) Interventions    Readmission Risk Interventions No flowsheet data found.

## 2021-08-06 NOTE — Evaluation (Addendum)
Clinical/Bedside Swallow Evaluation Patient Details  Name: Jacob Bishop MRN: [id number] Date of Birth: 1941-08-16  Today's Date: 08/06/2021 Time: SLP Start Time (ACUTE ONLY): 1024 SLP Stop Time (ACUTE ONLY): 8938 SLP Time Calculation (min) (ACUTE ONLY): 11 min  Past Medical History:  Past Medical History:  Diagnosis Date   AI (aortic insufficiency) 01/11/2018   Trace, noted on ECHO   Arthritis    CAD in native artery    Nuclear stress test 10/19: EF 58, normal perfusion, low risk   Cellulitis    Dermatitis    Diabetes mellitus    type II   Diastolic dysfunction 06/10/5101   Mild, noted on ECHO   Diverticulosis 01/31/2016   ASCENDING COLON AND CECUM, NOTED ON COLONOSCOPY   DOE (dyspnea on exertion)    Edema    Fatty liver 09/18/2004   Noted on [place] ABD   History of kidney stones 05/23/2002   Noted on CT Abd   Hx of colonic polyps 01/31/2016   Hyperlipidemia    Hypertension    Internal hemorrhoids 01/31/2016   Noted on colonoscopy   Obesity    OSA (obstructive sleep apnea)    cpap   Positional vertigo    Pulmonary hypertension ([place]) 01/11/2018   Mild, noted on ECHO   Rhinosinusitis    Skin lesion    TR (tricuspid regurgitation) 01/11/2018   Trace, noted on ECHO   Past Surgical History:  Past Surgical History:  Procedure Laterality Date   CARDIAC CATHETERIZATION     COLONOSCOPY     CORONARY STENT PLACEMENT     3 stents /1997   POLYPECTOMY     TONSILLECTOMY     TOTAL KNEE ARTHROPLASTY Right 10/04/2018   Procedure: RIGHT TOTAL KNEE ARTHROPLASTY;  Surgeon: Gaynelle Arabian, MD;  Location: [facility] ORS;  Service: Orthopedics;  Laterality: Right;  Adductor Block   HPI:  Jacob Bishop adm to [facility] with increased oxygen requirement.  Pt with PMH + for recent several day hospital admit for COVID, AI (aortic insufficiency) (01/11/2018), Arthritis, CAD in native artery, Cellulitis, Dermatitis, Diabetes mellitus, Diastolic dysfunction (58/52/7782), Diverticulosis (01/31/2016), DOE  (dyspnea on exertion), Edema, Fatty liver (09/18/2004), History of kidney stones (05/23/2002), colonic polyps (01/31/2016), Hyperlipidemia, Hypertension, Internal hemorrhoids (01/31/2016), Obesity, OSA (obstructive sleep apnea), Positional vertigo, Pulmonary hypertension ([place]) (01/11/2018), Rhinosinusitis, Skin lesion, and TR (tricuspid regurgitation) (01/11/2018).  Concerns are present for liver and lung mets concerning for metastatic disease.  Swallow eval reordered.   Pt was seen by SLP on 07/26/2021.    Assessment / Plan / Recommendation  Clinical Impression  Very limited evaluation as pt declined to go further with testing after meeting with MD.  Cranial nerve exam continues to be significant for facial asymmetry on the right with movement and lingual tip deviation to right upon protrusion.  Lingual strength appeared clinically the same with lateralization.  Pt observed with consumption of water - - subtle cough presented within 10 seconds of swallowing.   RR is shallow and increases with all po intake.  MD arrived and discussed with pt regarding his current medical condition.  After meeting, pt declined to participate in further evaluation.    SLP advised him to suspicion of aspiration - and reviewed information he shared from prior evaluation as well as precautions to mitidation strategies (at this time pt said he wanted to be left alone).   Pt does admit to difficulty swallowing while on oxygen - stating he can't breath through his nose - showing some awareness.  Recommend he order more cohesive foods to decrease aspiration risk that may cause discomfort.   Advised RN to pt's report and left swallow precautions/strategies to mitigate aspiration with respiratory issues *in writing if pt is amenable to view.  No SLP follow up indicated per pt wishes.     SLP Visit Diagnosis: Dysphagia, unspecified (R13.10)    Aspiration Risk  Moderate aspiration risk    Diet Recommendation Regular;Thin  liquid;NPO (with precautions)   Liquid Administration via: Cup;Straw Medication Administration: Whole meds with liquid Supervision: Patient able to self feed Compensations: Minimize environmental distractions;Small sips/bites;Slow rate (take rest breaks as needed) Postural Changes: Seated upright at 90 degrees;Remain upright for at least 30 minutes after po intake    Other  Recommendations Oral Care Recommendations: Oral care BID    Recommendations for follow up therapy are one component of a multi-disciplinary discharge planning process, led by the attending physician.  Recommendations may be updated based on patient status, additional functional criteria and insurance authorization.  Follow up Recommendations No SLP follow up      Assistance Recommended at Discharge None  Functional Status Assessment Patient has had a recent decline in their functional status and/or demonstrates limited ability to make significant improvements in function in a reasonable and predictable amount of time  Frequency and Duration            Prognosis        Swallow Study   General Date of Onset: 08/06/21 HPI: Jacob Bishop adm to [facility] with increased oxygen requirement.  Pt with PMH + for recent several day hospital admit for COVID, AI (aortic insufficiency) (01/11/2018), Arthritis, CAD in native artery, Cellulitis, Dermatitis, Diabetes mellitus, Diastolic dysfunction (89/38/1017), Diverticulosis (01/31/2016), DOE (dyspnea on exertion), Edema, Fatty liver (09/18/2004), History of kidney stones (05/23/2002), colonic polyps (01/31/2016), Hyperlipidemia, Hypertension, Internal hemorrhoids (01/31/2016), Obesity, OSA (obstructive sleep apnea), Positional vertigo, Pulmonary hypertension ([place]) (01/11/2018), Rhinosinusitis, Skin lesion, and TR (tricuspid regurgitation) (01/11/2018).  Concerns are present for liver and lung mets concerning for metastatic disease.  Swallow eval reordered.   Pt was seen by SLP on  07/26/2021. Type of Study: Bedside Swallow Evaluation Previous Swallow Assessment: prior BSE 12/2 Diet Prior to this Study: Regular;Thin liquids Temperature Spikes Noted: No Respiratory Status: Nasal cannula History of Recent Intubation: No Behavior/Cognition: Alert;Cooperative;Pleasant mood Oral Cavity Assessment: Within Functional Limits Oral Care Completed by SLP: No Oral Cavity - Dentition: Adequate natural dentition Vision: Functional for self-feeding Self-Feeding Abilities: Able to feed self Patient Positioning: Partially reclined Baseline Vocal Quality: Normal Volitional Cough: Strong Volitional Swallow: Able to elicit    Oral/Motor/Sensory Function Facial Symmetry: Abnormal symmetry right;Suspected CN VII (facial) dysfunction Lingual Symmetry:  (lingual deviation to the right upon protrusion,)   Ice Chips Ice chips: Not tested   Thin Liquid Presentation: Self Fed;Straw Pharyngeal  Phase Impairments: Cough - Immediate    Nectar Thick Nectar Thick Liquid: Not tested   Honey Thick Honey Thick Liquid: Not tested   Puree Puree: Not tested   Solid     Solid: Not tested      Macario Golds 08/06/2021,11:12 AM  Kathleen Lime, MS [facility] SLP Acute Rehab Services Office [phone] Pager [phone]

## 2021-08-06 NOTE — Progress Notes (Signed)
Daily Progress Note   Patient Name: Jacob Bishop       Date: 08/06/2021 DOB: 03-13-1941  Age: 80 y.o. MRN#: [id number] Attending Physician: Damita Lack, MD Primary Care Physician: [name], NP Admit Date: 07/24/2021  Reason for Consultation/Follow-up: Establishing goals of care  Subjective: I saw and examined Jacob Bishop.  He was lying in bed on BiPAP but in no distress.  He tells me he is feeling good today and that he feels his breathing is doing better.  States he has worn CPAP for the last 15 years and feels as though he cant breathe easier when he is wearing the BiPAP.  Denies any other needs at this time.  Length of Stay: 13  Current Medications: Scheduled Meds:   (feeding supplement) PROSource Plus  30 mL Oral Daily   budesonide (PULMICORT) nebulizer solution  0.5 mg Nebulization BID   buPROPion  150 mg Oral Daily   chlorhexidine  15 mL Mouth Rinse BID   Chlorhexidine Gluconate Cloth  6 each Topical Daily   enoxaparin (LOVENOX) injection  110 mg Subcutaneous Q12H   feeding supplement  237 mL Oral Q24H   fluticasone  2 spray Each Nare Daily   insulin aspart  0-15 Units Subcutaneous TID WC   insulin aspart  0-5 Units Subcutaneous QHS   insulin aspart  3 Units Subcutaneous TID WC   insulin glargine-yfgn  15 Units Subcutaneous QHS   ipratropium-albuterol  3 mL Inhalation TID   loratadine  10 mg Oral Daily   mouth rinse  15 mL Mouth Rinse q12n4p   multivitamin with minerals  1 tablet Oral Daily   oxybutynin  10 mg Oral Daily   pantoprazole  40 mg Oral Daily   predniSONE  40 mg Oral Q breakfast   simvastatin  20 mg Oral q1800   sodium chloride flush  3 mL Intravenous Q12H   tamsulosin  0.4 mg Oral BID    Continuous Infusions:   PRN Meds: acetaminophen **OR**  acetaminophen, alum & mag hydroxide-simeth, calcium carbonate, guaiFENesin-dextromethorphan, ipratropium-albuterol, lip balm, LORazepam, morphine injection, polyethylene glycol, senna-docusate, sodium chloride  Physical Exam           No distress On BiPAP No edema Rate controlled Has venous stasis changes both LE No focal deficits Noted to have hematuria Vital Signs:  BP (!) 107/55    Pulse 71    Temp 97.9 F (36.6 C) (Axillary)    Resp (!) 31    Ht 5\' 11"  (1.803 m)    Wt 106.2 kg    SpO2 94%    BMI 32.65 kg/m  SpO2: SpO2: 94 % O2 Device: O2 Device: Nasal Cannula O2 Flow Rate: O2 Flow Rate (L/min): 6 L/min  Intake/output summary:  Intake/Output Summary (Last 24 hours) at 08/06/2021 1825 Last data filed at 08/06/2021 1200 Gross per 24 hour  Intake 175 ml  Output 1300 ml  Net -1125 ml    LBM: Last BM Date: 08/04/21 Baseline Weight: Weight: 110 kg Most recent weight: Weight: 106.2 kg       Palliative Assessment/Data:      Patient Active Problem List   Diagnosis Date Noted   Palliative care by specialist    Chest pain 07/27/2021   Depression 07/25/2021   Pressure injury of skin 07/25/2021   Acute pulmonary embolus ([place]) 07/24/2021   Acute on chronic respiratory failure with hypoxia ([place]) 06/30/2021   DM type 2 with diabetic mixed hyperlipidemia ([place]) 06/30/2021   Mass of lung 06/27/2021   Lesion of liver 06/27/2021   Chronic respiratory failure with hypoxia ([place]) 06/27/2021   OA (osteoarthritis) of knee 10/04/2018   Other secondary pulmonary hypertension ([place]) 04/06/2018   Osteoarthritis, hand 05/05/2014   Carpal tunnel syndrome 05/02/2014   Eczema 01/23/2014   Thrombocytopenia ([place]) 11/19/2012   Overactive bladder 08/24/2012   Osteoarthritis of right knee 01/22/2012   Benign positional vertigo 07/09/2011   CAD, NATIVE VESSEL 10/22/2009   EDEMA 08/03/2009   Obstructive sleep apnea 05/03/2009   SKIN LESION 09/23/2007   Diabetes mellitus type 2, uncontrolled  03/23/2007   Hyperlipidemia 03/23/2007   Essential hypertension 03/23/2007   COLONIC POLYPS, HX OF 03/23/2007   Morbid obesity ([place]) 03/23/2007    Palliative Care Assessment & Plan   Patient Profile:    Assessment:  Acute PE CT imaging showing multiple pulmonary nodules/masses as well as metastatic lesions in liver.  Has OSA DM, recent COVID-19 infection Functional decline Dyspnea on slight exertion   Recommendations/Plan: DNR/DNI Reports that he understands concern about his overall situation, however, he tells me that he feels his breathing is a little better today and wants to continue with current interventions to see how he does over the next couple of days. Palliative will continue to follow.  Goals of Care and Additional Recommendations: Limitations on Scope of Treatment: Full Scope Treatment Only limitation is DO NOT RESUSCITATE/DO NOT INTUBATE. Code Status: After family meeting today Jacob Bishop is revised as DNR/DNI as of 08-05-2021.    Code Status Orders  (From admission, onward)           Start     Ordered   07/24/21 2305  Full code  Continuous        07/24/21 2306           Code Status History     Date Active Date Inactive Code Status Order ID Comments User Context   06/30/2021 0055 07/02/2021 2313 Full Code [id number]  Orene Desanctis, DO ED   10/04/2018 1538 10/11/2018 1930 Full Code [id number]  Gaynelle Arabian, MD Inpatient       Prognosis:  Guarded   Discharge Planning: To Be Determined  Care plan was discussed with patient.    Thank you for allowing the Palliative Medicine Team to assist in the care of this patient.   Total  Time 25 Prolonged Time Billed  no    Greater than 50%  of this time was spent counseling and coordinating care related to the above assessment and plan.  Micheline Rough, MD  Please contact Palliative Medicine Team phone at [phone] for questions and concerns.

## 2021-08-07 LAB — GLUCOSE, CAPILLARY
Glucose-Capillary: 115 mg/dL — ABNORMAL HIGH (ref 70–99)
Glucose-Capillary: 166 mg/dL — ABNORMAL HIGH (ref 70–99)
Glucose-Capillary: 164 mg/dL — ABNORMAL HIGH (ref 70–99)
Glucose-Capillary: 160 mg/dL — ABNORMAL HIGH (ref 70–99)

## 2021-08-07 LAB — CBC
HCT: 34.8 % — ABNORMAL LOW (ref 39.0–52.0)
Hemoglobin: 11.5 g/dL — ABNORMAL LOW (ref 13.0–17.0)
MCH: 31.4 pg (ref 26.0–34.0)
MCHC: 33 g/dL (ref 30.0–36.0)
MCV: 95.1 fL (ref 80.0–100.0)
Platelets: 91 10*3/uL — ABNORMAL LOW (ref 150–400)
RBC: 3.66 MIL/uL — ABNORMAL LOW (ref 4.22–5.81)
RDW: 14.3 % (ref 11.5–15.5)
WBC: 7.2 10*3/uL (ref 4.0–10.5)
nRBC: 0 % (ref 0.0–0.2)

## 2021-08-07 LAB — BASIC METABOLIC PANEL
Anion gap: 7 (ref 5–15)
BUN: 21 mg/dL (ref 8–23)
CO2: 30 mmol/L (ref 22–32)
Calcium: 8 mg/dL — ABNORMAL LOW (ref 8.9–10.3)
Chloride: 97 mmol/L — ABNORMAL LOW (ref 98–111)
Creatinine, Ser: 0.88 mg/dL (ref 0.61–1.24)
GFR, Estimated: >60 mL/min (ref >60)
Glucose, Bld: 151 mg/dL — ABNORMAL HIGH (ref 70–99)
Potassium: 4.3 mmol/L (ref 3.5–5.1)
Sodium: 134 mmol/L — ABNORMAL LOW (ref 135–145)

## 2021-08-07 LAB — MAGNESIUM: Magnesium: 2.1 mg/dL (ref 1.7–2.4)

## 2021-08-07 MED ORDER — IPRATROPIUM-ALBUTEROL 0.5-2.5 (3) MG/3ML IN SOLN
3.0000 mL | Freq: Two times a day (BID) | RESPIRATORY_TRACT | Status: DC
  Administered 2021-08-08 – 2021-08-25 (×34): 3 mL via RESPIRATORY_TRACT
  Filled 2021-08-07 (×35): qty 3

## 2021-08-07 MED ORDER — LACTATED RINGERS IV SOLN: INTRAVENOUS | Status: AC

## 2021-08-07 NOTE — Plan of Care (Signed)

## 2021-08-07 NOTE — Progress Notes (Signed)
Daily Progress Note   Patient Name: Jacob Bishop       Date: 08/07/2021 DOB: 03-Nov-1940  Age: 80 y.o. MRN#: [id number] Attending Physician: Kathie Dike, MD Primary Care Physician: [name], NP Admit Date: 07/24/2021  Reason for Consultation/Follow-up: Establishing goals of care  Subjective: I saw and examined Jacob Bishop today.  He was lying in bed in no distress wearing BiPAP.    He reports again today that his breathing continues to improve.  We discussed my concern about the fact that he is requiring BiPAP throughout the day as well as at night and that I worry he is not making appreciable gains in respiratory function.  He reports that his breathing feels better when he wears BiPAP and we talked about a plan to see how this continues to progress in the next couple of days, however, I am worried about him being dependent on BiPAP and that this is not long-term care modality.  Denies any other needs at this time.  Length of Stay: 14  Current Medications: Scheduled Meds:   (feeding supplement) PROSource Plus  30 mL Oral Daily   budesonide (PULMICORT) nebulizer solution  0.5 mg Nebulization BID   buPROPion  150 mg Oral Daily   chlorhexidine  15 mL Mouth Rinse BID   Chlorhexidine Gluconate Cloth  6 each Topical Daily   enoxaparin (LOVENOX) injection  110 mg Subcutaneous Q12H   feeding supplement  237 mL Oral Q24H   fluticasone  2 spray Each Nare Daily   insulin aspart  0-15 Units Subcutaneous TID WC   insulin aspart  0-5 Units Subcutaneous QHS   insulin aspart  3 Units Subcutaneous TID WC   insulin glargine-yfgn  15 Units Subcutaneous QHS   ipratropium-albuterol  3 mL Inhalation TID   loratadine  10 mg Oral Daily   mouth rinse  15 mL Mouth Rinse q12n4p   multivitamin with  minerals  1 tablet Oral Daily   oxybutynin  10 mg Oral Daily   pantoprazole  40 mg Oral Daily   predniSONE  40 mg Oral Q breakfast   simvastatin  20 mg Oral q1800   sodium chloride flush  3 mL Intravenous Q12H   tamsulosin  0.4 mg Oral BID    Continuous Infusions:  lactated ringers 75 mL/hr at 08/07/21 1600  PRN Meds: acetaminophen **OR** acetaminophen, alum & mag hydroxide-simeth, calcium carbonate, guaiFENesin-dextromethorphan, ipratropium-albuterol, lip balm, LORazepam, morphine injection, polyethylene glycol, senna-docusate, sodium chloride  Physical Exam           No distress On BiPAP No edema Rate controlled Has venous stasis changes both LE No focal deficits Noted to have hematuria Vital Signs: BP 116/65 (BP Location: Left Arm)    Pulse 89    Temp 98.3 F (36.8 C) (Oral)    Resp (!) 21    Ht 5\' 11"  (1.803 m)    Wt 106.2 kg    SpO2 93%    BMI 32.65 kg/m  SpO2: SpO2: 93 % O2 Device: O2 Device: High Flow Nasal Cannula O2 Flow Rate: O2 Flow Rate (L/min): 6 L/min  Intake/output summary:  Intake/Output Summary (Last 24 hours) at 08/07/2021 1649 Last data filed at 08/07/2021 1600 Gross per 24 hour  Intake 295.2 ml  Output 750 ml  Net -454.8 ml    LBM: Last BM Date: 08/06/21 Baseline Weight: Weight: 110 kg Most recent weight: Weight: 106.2 kg       Palliative Assessment/Data:      Patient Active Problem List   Diagnosis Date Noted   Palliative care by specialist    Chest pain 07/27/2021   Depression 07/25/2021   Pressure injury of skin 07/25/2021   Acute pulmonary embolus ([place]) 07/24/2021   Acute on chronic respiratory failure with hypoxia ([place]) 06/30/2021   DM type 2 with diabetic mixed hyperlipidemia ([place]) 06/30/2021   Mass of lung 06/27/2021   Lesion of liver 06/27/2021   Chronic respiratory failure with hypoxia ([place]) 06/27/2021   OA (osteoarthritis) of knee 10/04/2018   Other secondary pulmonary hypertension ([place]) 04/06/2018   Osteoarthritis,  hand 05/05/2014   Carpal tunnel syndrome 05/02/2014   Eczema 01/23/2014   Thrombocytopenia ([place]) 11/19/2012   Overactive bladder 08/24/2012   Osteoarthritis of right knee 01/22/2012   Benign positional vertigo 07/09/2011   CAD, NATIVE VESSEL 10/22/2009   EDEMA 08/03/2009   Obstructive sleep apnea 05/03/2009   SKIN LESION 09/23/2007   Diabetes mellitus type 2, uncontrolled 03/23/2007   Hyperlipidemia 03/23/2007   Essential hypertension 03/23/2007   COLONIC POLYPS, HX OF 03/23/2007   Morbid obesity ([place]) 03/23/2007    Palliative Care Assessment & Plan   Patient Profile:    Assessment:  Acute PE CT imaging showing multiple pulmonary nodules/masses as well as metastatic lesions in liver.  Has OSA DM, recent COVID-19 infection Functional decline Dyspnea on slight exertion   Recommendations/Plan: DNR/DNI Continue current care.  Subjectively, he reports feeling as though his breathing is improving.  Will continue to follow closely. Palliative will continue to follow.  Goals of Care and Additional Recommendations: Limitations on Scope of Treatment: Full Scope Treatment Only limitation is DO NOT RESUSCITATE/DO NOT INTUBATE. Code Status: After family meeting today Jacob Bishop is revised as DNR/DNI as of 08-05-2021.    Code Status Orders  (From admission, onward)           Start     Ordered   07/24/21 2305  Full code  Continuous        07/24/21 2306           Code Status History     Date Active Date Inactive Code Status Order ID Comments User Context   06/30/2021 0055 07/02/2021 2313 Full Code [id number]  Orene Desanctis, DO ED   10/04/2018 1538 10/11/2018 1930 Full Code [id number]  Gaynelle Arabian, MD Inpatient  Prognosis:  Guarded   Discharge Planning: To Be Determined  Care plan was discussed with patient.    Thank you for allowing the Palliative Medicine Team to assist in the care of this patient.   Total Time 20 Prolonged Time Billed  no    Greater  than 50%  of this time was spent counseling and coordinating care related to the above assessment and plan.  Micheline Rough, MD  Please contact Palliative Medicine Team phone at [phone] for questions and concerns.

## 2021-08-07 NOTE — Plan of Care (Addendum)
Pt wore bipap from [place].  Pt denied pain, discomfort or respiratory distress this shift.  Pt states he got a good restful sleep. Problem: Education: Goal: Knowledge of General Education information will improve Description: Including pain rating scale, medication(s)/side effects and non-pharmacologic comfort measures Outcome: Progressing   Problem: Health Behavior/Discharge Planning: Goal: Ability to manage health-related needs will improve Outcome: Progressing   Problem: Clinical Measurements: Goal: Ability to maintain clinical measurements within normal limits will improve Outcome: Progressing Goal: Will remain free from infection Outcome: Progressing Goal: Diagnostic test results will improve Outcome: Progressing Goal: Respiratory complications will improve Outcome: Progressing Goal: Cardiovascular complication will be avoided Outcome: Progressing   Problem: Activity: Goal: Risk for activity intolerance will decrease Outcome: Progressing   Problem: Nutrition: Goal: Adequate nutrition will be maintained Outcome: Progressing   Problem: Coping: Goal: Level of anxiety will decrease Outcome: Progressing   Problem: Elimination: Goal: Will not experience complications related to bowel motility Outcome: Progressing Goal: Will not experience complications related to urinary retention Outcome: Progressing   Problem: Pain Managment: Goal: General experience of comfort will improve Outcome: Progressing   Problem: Safety: Goal: Ability to remain free from injury will improve Outcome: Progressing   Problem: Skin Integrity: Goal: Risk for impaired skin integrity will decrease Outcome: Progressing

## 2021-08-07 NOTE — Progress Notes (Signed)
PROGRESS NOTE    Jacob Bishop  ZDG:[id number] DOB: 09-Jul-1941 DOA: 07/24/2021 PCP: [name], NP   Brief Narrative:  80 year old with chronic hypoxic respiratory failure with COVID-19 infection, CAD, OSA, DM2, HTN, thrombocytopenia, liver and lung lesion concerning for metastatic disease admitted for shortness of breath found to have acute PE.  Due to metastatic lesion, IR consulted for biopsy.  Unable to perform liver biopsy as it was thought it could be hemangioma.  Patient needs lung biopsy once breathing is stabilized.   Palliative care team consulted.  Concerns of metastatic spread.  Patient has been seen by pulmonary team as well.  Due to worsening respiratory status he had to be placed on BiPAP and transferred to stepdown unit.  Patient has now been made DNR/DNI.   Assessment & Plan:   Principal Problem:   Acute pulmonary embolus (HCC) Active Problems:   Obstructive sleep apnea   Essential hypertension   CAD, NATIVE VESSEL   Thrombocytopenia ([facility])   Mass of lung   Lesion of liver   Acute on chronic respiratory failure with hypoxia ([facility])   DM type 2 with diabetic mixed hyperlipidemia (H[facility])   Depression   Pressure injury of skin   Chest pain   Palliative care by specialist  Acute respiratory distress with hypoxia Bilateral diffuse rhonchi - He is still requiring BiPAP through most of the day and at night.  Maintain stepdown unit.  Currently on oral prednisone.  He is also have very high risk for aspiration. -Try to give a trial off of BiPAP today - Bronchodilators, I-S/flutter.  Out of bed to chair. - BNP and procalcitonin negative -Continue steroids -His respiratory failure likely related to his underlying metastatic disease  Acute pulmonary embolism, subsegmental - Lovenox 1 mg/kg every 12 hours.  Eventually transition to NOAC - Echocardiogram EF normal. - Lower extremity ultrasound showed right lower extremity DVT  Hematuria - Staff reports that urine appears  more brown in color than true materia -Hemoglobin is stable -We will continue anticoagulation and monitor  COVID-19 infection - 06/29/21. Now out of isolation. Repeat test here is negative. Procal 0.21  Metastatic lesions in the lung and liver -Unknown primary, liver lesions appears to be hemangioma, I have recommended CT liver protocol when more stable.  Appears to have pulmonary/lymphangitic spread.  Unfortunately he is doing very poorly and not a good candidate for any form of further diagnostic work-up.  Patient is agreeable to this.  Overall very poor prognosis.  Diabetes mellitus type 2 - Semglee 15 units due to hypoglycemia.  Sliding scale and Accu-Cheks.   -Blood sugars currently stable  Thrombocytopenia -No evidence of bleeding.  Continue to monitor  Depression - Wellbutrin  Hyperlipidemia - Zocor  Obstructive sleep apnea - CPAP/BiPAP as needed  Over poor respiratory efforts and poor prognosis.  Seen by palliative care team.  Patient is now willing to transition to full comfort care yet.    DVT prophylaxis: Lovenox  Code Status: DNR/DNI Family Communication: No family present.  Tried calling patient's daughter Juliann Pulse, unable to reach her.  Status is: Inpatient  Remains inpatient appropriate because: Still has significant amount of abnormal breath sounds.  He is not safe for discharge.    Nutritional status  Nutrition Problem: Increased nutrient needs Etiology: acute illness, chronic illness  Signs/Symptoms: estimated needs  Interventions: Ensure Enlive (each supplement provides 350kcal and 20 grams of protein), Prostat, MVI  Body mass index is 32.65 kg/m.  Pressure Injury 07/25/21 Buttocks Right Stage 2 -  Partial thickness loss of dermis presenting as a shallow open injury with a red, pink wound bed without slough. (Active)  07/25/21 1530  Location: Buttocks  Location Orientation: Right  Staging: Stage 2 -  Partial thickness loss of dermis presenting as a  shallow open injury with a red, pink wound bed without slough.  Wound Description (Comments):   Present on Admission: Yes     Pressure Injury 07/25/21 Buttocks Left Stage 2 -  Partial thickness loss of dermis presenting as a shallow open injury with a red, pink wound bed without slough. (Active)  07/25/21 1531  Location: Buttocks  Location Orientation: Left  Staging: Stage 2 -  Partial thickness loss of dermis presenting as a shallow open injury with a red, pink wound bed without slough.  Wound Description (Comments):   Present on Admission: Yes     Pressure Injury 08/06/21 Sacrum Medial Deep Tissue Pressure Injury - Purple or maroon localized area of discolored intact skin or blood-filled blister due to damage of underlying soft tissue from pressure and/or shear. (Active)  08/06/21 1600  Location: Sacrum  Location Orientation: Medial  Staging: Deep Tissue Pressure Injury - Purple or maroon localized area of discolored intact skin or blood-filled blister due to damage of underlying soft tissue from pressure and/or shear.  Wound Description (Comments):   Present on Admission: No     Subjective: Reports that he is tolerating BiPAP.  Denies any chest pain.  Examination: Constitutional: Not in acute distress, currently on BiPAP Respiratory: Bilateral diminished breath sounds, shallow breathing Cardiovascular: Normal sinus rhythm, no rubs Abdomen: Nontender nondistended good bowel sounds Musculoskeletal: No edema noted Skin: No rashes seen Neurologic: CN 2-12 grossly intact.  And nonfocal Psychiatric: Normal judgment and insight. Alert and oriented x 3. Normal mood.    Objective: Vitals:   08/07/21 1600 08/07/21 1700 08/07/21 1800 08/07/21 1944  BP: 116/65 (!) 125/58 135/73   Pulse: 89 85 96   Resp: (!) 21     Temp: 98.3 F (36.8 C)     TempSrc: Oral     SpO2: 93% 92% 93% 98%  Weight:      Height:        Intake/Output Summary (Last 24 hours) at 08/07/2021 1948 Last data  filed at 08/07/2021 1800 Gross per 24 hour  Intake 445.1 ml  Output 1100 ml  Net -654.9 ml   Filed Weights   07/25/21 1500 07/28/21 0414 08/03/21 1625  Weight: 110 kg 110 kg 106.2 kg     Data Reviewed:   CBC: Recent Labs  Lab 08/03/21 0441 08/04/21 0329 08/05/21 0306 08/06/21 0317 08/07/21 0252  WBC 7.5 6.8 6.4 7.4 7.2  HGB 11.8* 11.4* 11.5* 11.5* 11.5*  HCT 36.6* 35.6* 35.8* 35.1* 34.8*  MCV 95.1 96.0 95.0 94.1 95.1  PLT 96* 91* 89* 96* 91*   Basic Metabolic Panel: Recent Labs  Lab 08/03/21 0441 08/04/21 0329 08/05/21 0306 08/06/21 0317 08/07/21 0252  NA 138 137 134* 138 134*  K 4.0 4.2 4.2 5.1 4.3  CL 97* 96* 95* 98 97*  CO2 34* 35* 32 33* 30  GLUCOSE 61* 133* 96 118* 151*  BUN 18 21 21 20 21   CREATININE 0.75 1.00 0.88 0.87 0.88  CALCIUM 8.0* 8.0* 8.0* 8.3* 8.0*  MG 2.4 2.2 2.2 2.2 2.1   GFR: Estimated Creatinine Clearance: 85.8 mL/min (by C-G formula based on SCr of 0.88 mg/dL). Liver Function Tests: No results for input(s): AST, ALT, ALKPHOS, BILITOT, PROT, ALBUMIN in the last  168 hours.  No results for input(s): LIPASE, AMYLASE in the last 168 hours. No results for input(s): AMMONIA in the last 168 hours. Coagulation Profile: No results for input(s): INR, PROTIME in the last 168 hours.  Cardiac Enzymes: No results for input(s): CKTOTAL, CKMB, CKMBINDEX, TROPONINI in the last 168 hours. BNP (last 3 results) No results for input(s): PROBNP in the last 8760 hours. HbA1C: No results for input(s): HGBA1C in the last 72 hours. CBG: Recent Labs  Lab 08/06/21 1630 08/06/21 2234 08/07/21 0748 08/07/21 1137 08/07/21 1629  GLUCAP 219* 173* 115* 166* 164*   Lipid Profile: No results for input(s): CHOL, HDL, LDLCALC, TRIG, CHOLHDL, LDLDIRECT in the last 72 hours. Thyroid Function Tests: No results for input(s): TSH, T4TOTAL, FREET4, T3FREE, THYROIDAB in the last 72 hours. Anemia Panel: No results for input(s): VITAMINB12, FOLATE, FERRITIN, TIBC,  IRON, RETICCTPCT in the last 72 hours. Sepsis Labs: Recent Labs  Lab 08/02/21 0445  PROCALCITON <0.10    No results found for this or any previous visit (from the past 240 hour(s)).        Radiology Studies: No results found.      Scheduled Meds:  (feeding supplement) PROSource Plus  30 mL Oral Daily   budesonide (PULMICORT) nebulizer solution  0.5 mg Nebulization BID   buPROPion  150 mg Oral Daily   chlorhexidine  15 mL Mouth Rinse BID   Chlorhexidine Gluconate Cloth  6 each Topical Daily   enoxaparin (LOVENOX) injection  110 mg Subcutaneous Q12H   feeding supplement  237 mL Oral Q24H   fluticasone  2 spray Each Nare Daily   insulin aspart  0-15 Units Subcutaneous TID WC   insulin aspart  0-5 Units Subcutaneous QHS   insulin aspart  3 Units Subcutaneous TID WC   insulin glargine-yfgn  15 Units Subcutaneous QHS   ipratropium-albuterol  3 mL Inhalation TID   loratadine  10 mg Oral Daily   mouth rinse  15 mL Mouth Rinse q12n4p   multivitamin with minerals  1 tablet Oral Daily   oxybutynin  10 mg Oral Daily   pantoprazole  40 mg Oral Daily   predniSONE  40 mg Oral Q breakfast   simvastatin  20 mg Oral q1800   sodium chloride flush  3 mL Intravenous Q12H   tamsulosin  0.4 mg Oral BID   Continuous Infusions:  lactated ringers 75 mL/hr at 08/07/21 1800      LOS: 14 days   Time spent= 35 mins    Kathie Dike, MD [facility]  If 7PM-7AM, please contact night-coverage  08/07/2021, 7:48 PM

## 2021-08-07 NOTE — Progress Notes (Signed)
PT Cancellation Note  Patient Details Name: Jacob Bishop MRN: [id number] DOB: 1941-06-05   Cancelled Treatment:    Reason Eval/Treat Not Completed: Patient declined, no reason specified, declined, just ate and has visitors. Will check back another time. [place] Pager [phone] Office [phone]    Claretha Cooper 08/07/2021, 2:20 PM

## 2021-08-08 LAB — CBC
HCT: 33.3 % — ABNORMAL LOW (ref 39.0–52.0)
Hemoglobin: 11 g/dL — ABNORMAL LOW (ref 13.0–17.0)
MCH: 31.2 pg (ref 26.0–34.0)
MCHC: 33 g/dL (ref 30.0–36.0)
MCV: 94.3 fL (ref 80.0–100.0)
Platelets: 89 10*3/uL — ABNORMAL LOW (ref 150–400)
RBC: 3.53 MIL/uL — ABNORMAL LOW (ref 4.22–5.81)
RDW: 14.6 % (ref 11.5–15.5)
WBC: 6.5 10*3/uL (ref 4.0–10.5)
nRBC: 0 % (ref 0.0–0.2)

## 2021-08-08 LAB — GLUCOSE, CAPILLARY
Glucose-Capillary: 105 mg/dL — ABNORMAL HIGH (ref 70–99)
Glucose-Capillary: 181 mg/dL — ABNORMAL HIGH (ref 70–99)
Glucose-Capillary: 161 mg/dL — ABNORMAL HIGH (ref 70–99)
Glucose-Capillary: 211 mg/dL — ABNORMAL HIGH (ref 70–99)

## 2021-08-08 LAB — BASIC METABOLIC PANEL
Anion gap: 5 (ref 5–15)
BUN: 19 mg/dL (ref 8–23)
CO2: 29 mmol/L (ref 22–32)
Calcium: 7.5 mg/dL — ABNORMAL LOW (ref 8.9–10.3)
Chloride: 99 mmol/L (ref 98–111)
Creatinine, Ser: 0.96 mg/dL (ref 0.61–1.24)
GFR, Estimated: >60 mL/min (ref >60)
Glucose, Bld: 115 mg/dL — ABNORMAL HIGH (ref 70–99)
Potassium: 3.8 mmol/L (ref 3.5–5.1)
Sodium: 133 mmol/L — ABNORMAL LOW (ref 135–145)

## 2021-08-08 LAB — MAGNESIUM: Magnesium: 2.1 mg/dL (ref 1.7–2.4)

## 2021-08-08 NOTE — Progress Notes (Signed)
Patient has not gotten out of bed with RN today 12/15.    Patient had visitors majority of the day.  RN asked patient at 1600 (after visitors had left) if he would like to get up to the chair and patient stated he was very tired from his visitors & was not willing to get up to chair at this time.  RN asked patient to rest and to consider getting up to the chair later.  NT asked patient at 1700 about getting a bath and getting up to the chair, patient refused both.

## 2021-08-08 NOTE — Progress Notes (Signed)
Nutrition Follow-up  DOCUMENTATION CODES:   Obesity unspecified  INTERVENTION:  - continue Ensure Enlive once/day and 30 ml Prosource Plus once/day.    NUTRITION DIAGNOSIS:   Increased nutrient needs related to acute illness, chronic illness as evidenced by estimated needs. -ongoing  GOAL:   Patient will meet greater than or equal to 90% of their needs -met  MONITOR:   PO intake, Supplement acceptance, Labs, Weight trends  ASSESSMENT:   80 y.o. male with medical history of CAD, insulin-dependent DM, OSA on CPAP, on 2L O2 since recent COVID infection, lung nodules and liver lesion being worked up outpatient, HTN, CAD, HLD, arthritis, fatty liver, diverticulosis, aortic insufficiency, and tricuspid regurgitation. Patient presented to the ED with increased shortness of breath and increased O2 requirement. He had been discharged from the hospital on 11/8.  Recent meal intakes: 12/11- 100% of all meals (total of 990 kcal and 33 grams protein 12/13- 100% of breakfast and lunch (total of 1186 kcal and 59 grams protein) 12/14- 100% of all meals (total of 1787 kcal and 72 grams protein)  He has been accepting Ensure Enlive ~90% of the time offered and Prosource Plus 100% of the time offered.   He has not been weighed since 12/10. Mild pitting edema to BLE documented in the edema section of flow sheet.     Labs reviewed; CBGs: 105 and 181 mg/dl, Na: 133 mmol/l, Ca: 7.5 mg/dl.  Medications reviewed; sliding scale novolog, 3 units novolog TID, 15 units semglee/day, 1 tablet multivitamin with minerals/day, 40 mg oral protonix/day, 40 mg deltasone/day.    Diet Order:   Diet Order             Diet Carb Modified Fluid consistency: Thin; Room service appropriate? Yes  Diet effective now                   EDUCATION NEEDS:   Not appropriate for education at this time  Skin:  Skin Assessment: Skin Integrity Issues: Skin Integrity Issues:: Stage II, DTI DTI: sacrum (newly  documented 12/13) Stage II: bilateral buttocks  Last BM:  12/6 per nursing flow sheet  Height:   Ht Readings from Last 1 Encounters:  08/03/21 5' 11"  (1.803 m)    Weight:   Wt Readings from Last 1 Encounters:  08/03/21 106.2 kg     Estimated Nutritional Needs:  Kcal:  2000-2200 kcal Protein:  105-125 grams Fluid:  >/= 1.7 L/day     Jarome Matin, MS, RD, LDN, CNSC Inpatient Clinical Dietitian RD pager # available in [place]  After hours/weekend pager # available in [facility]

## 2021-08-08 NOTE — Progress Notes (Signed)
PT Cancellation Note  Patient Details Name: Jacob Bishop MRN: [id number] DOB: 1941/03/20   Cancelled Treatment:     PT attempted but deferred at request of pt, pt states he is fatigued from multiple visitors and has been up with nursing several times today.  Will follow   Jani Ploeger 08/08/2021, 3:13 PM

## 2021-08-08 NOTE — Progress Notes (Signed)
PROGRESS NOTE    Jacob Bishop  TDS:[id number] DOB: [birth year] DOA: 07/24/2021 PCP: [name], NP    Brief Narrative:  Mr. Jacob Bishop was admitted to the hospital with the working diagnosis of acute hypoxemic respiratory failure, in the setting of acute pulmonary embolism.   80 year old male past medical history for coronary artery disease, type 2 diabetes mellitus, and obstructive sleep apnea. He also has lung nodules and liver lesions.  Recent hospitalization 11/5-11/03/2021 for acute hypoxic respiratory failure due to SARS COVID-19 viral pneumonia.  By time of his discharge he was still very weak and deconditioned, but not until a few days prior to his rehospitalization he developed worsening dyspnea that prompted him to come back to the hospital.  On his initial physical examination his oximetry was in the low 90s on 5 L supplemental oxygen per nasal cannula, respiratory rate 26-31, heart rate 103, blood pressure 128/64, his lungs had no wheezing, heart S1-S2, present, rhythmic, abdomen soft nontender, positive right lower extremity edema.  Sodium 135, potassium 3.6, chloride 93, bicarb 32, glucose 143, BUN 12, creatinine 0.89, white count 7.0, hemoglobin 13.2, hematocrit 39.7, platelets 118. SARS COVID-19 negative.  Chest radiograph with hyperinflation.  CT chest with small segmental arterial embolus in the right upper lobe.  Multiple mildly enlarged mediastinal and hilar lymph nodes.  Numerous bilateral lung masses with several small new nodules without cavitation and with interval enlargement of pre-existing nodules.  EKG 85 bpm, normal axis, normal intervals, sinus rhythm, no significant ST segment changes, negative T wave lead III-aVF.  Patient was placed on heparin for anticoagulation. Unable to do biopsy due to respiratory failure. Was placed on systemic corticosteroids along with bronchodilators.  12/12 patient had worsening respiratory failure, transferred to stepdown unit for  noninvasive mechanical ventilation. Patient with metastatic lesions, palliative care was consulted.  CODE STATUS changed to DNR/DNI.  Assessment & Plan:   Principal Problem:   Acute pulmonary embolus (HCC) Active Problems:   Obstructive sleep apnea   Essential hypertension   CAD, NATIVE VESSEL   Thrombocytopenia (HCC)   Mass of lung   Lesion of liver   Acute on chronic respiratory failure with hypoxia (HCC)   DM type 2 with diabetic mixed hyperlipidemia (HCC)   Depression   Pressure injury of skin   Chest pain   Palliative care by specialist   Acute on chronic hypoxemic respiratory failure, post SARS covid 19 viral pneumonia, acute pulmonary embolism, bilateral pulmonary nodules (worsening). Right lower extremity DVT.  Patient this am continue to have mild dyspnea, and he has been off non invasive mechanical ventilation during the day.  His oxymetry is 92% on 6 L/min per HFNC.   Plan to continue medical therapy with bronchodilators and systemic steroids. Anticoagulation with enoxaparin.  Keep oxygen saturation 88% or greater.  Patient not sable for biopsy, but malignancy in the differential. (Metastatic disease).  Ok to change level of care to progressive care.   2. T2DM/ dyslipidemia. Continue glucose cover and monitoring with insulin therapy, basal and sliding scale. Steroid induced hyperglycemia   Continue with statin therapy.   3. OSA. Continue with bipap at night.   4. Thrombocytopenia/ anemia of chronic disease. Stable cell count with hgb at 11,0 and hct at 33.3. Plt 89.   5. Hyponatremia. Renal function with serum cr at 0,96, K is 3.8 and Na is 133. Continue to encourage po intake. Follow electrolytes.    6. Hepatic cirrhosis with positive 4,7 low density lesion in the caudate lobe.  Possible hemangioma, will need further workup when patient more sable.   7. Stage 2 bilateral buttocks pressure ulcer (present on admission). Continue local wound care.   Patient  continue to be at high risk for worsening respiratory failure   Status is: Inpatient  Remains inpatient appropriate because: respiratory support.    DVT prophylaxis: Enoxaparin   Code Status:    DNR   Family Communication:   No family at the bedside      Nutrition Status: Nutrition Problem: Increased nutrient needs Etiology: acute illness, chronic illness Signs/Symptoms: estimated needs Interventions: Ensure Enlive (each supplement provides 350kcal and 20 grams of protein), Prostat, MVI     Skin Documentation: Pressure Injury 07/25/21 Buttocks Right Stage 2 -  Partial thickness loss of dermis presenting as a shallow open injury with a red, pink wound bed without slough. (Active)  07/25/21 1530  Location: Buttocks  Location Orientation: Right  Staging: Stage 2 -  Partial thickness loss of dermis presenting as a shallow open injury with a red, pink wound bed without slough.  Wound Description (Comments):   Present on Admission: Yes     Pressure Injury 07/25/21 Buttocks Left Stage 2 -  Partial thickness loss of dermis presenting as a shallow open injury with a red, pink wound bed without slough. (Active)  07/25/21 1531  Location: Buttocks  Location Orientation: Left  Staging: Stage 2 -  Partial thickness loss of dermis presenting as a shallow open injury with a red, pink wound bed without slough.  Wound Description (Comments):   Present on Admission: Yes     Pressure Injury 08/06/21 Sacrum Medial Deep Tissue Pressure Injury - Purple or maroon localized area of discolored intact skin or blood-filled blister due to damage of underlying soft tissue from pressure and/or shear. (Active)  08/06/21 1600  Location: Sacrum  Location Orientation: Medial  Staging: Deep Tissue Pressure Injury - Purple or maroon localized area of discolored intact skin or blood-filled blister due to damage of underlying soft tissue from pressure and/or shear.  Wound Description (Comments):   Present on  Admission: No     Consultants:  Pulmonary  Palliative care     Subjective: Patient continue to have dyspnea at rest, no chest pain, no nausea or vomiting, continue to be very weak and deconditioned   Objective: Vitals:   08/08/21 0809 08/08/21 0810 08/08/21 0911 08/08/21 0912  BP:      Pulse: 79  (!) 38   Resp: (!) 26  (!) 32   Temp:      TempSrc:      SpO2:  97% (!) 88% 92%  Weight:      Height:        Intake/Output Summary (Last 24 hours) at 08/08/2021 0953 Last data filed at 08/08/2021 0900 Gross per 24 hour  Intake 442.1 ml  Output 850 ml  Net -407.9 ml   Filed Weights   07/25/21 1500 07/28/21 0414 08/03/21 1625  Weight: 110 kg 110 kg 106.2 kg    Examination:   General: Not in pain or dyspnea, deconditioned  Neurology: Awake and alert, non focal  E ENT: positive pallor, no icterus, oral mucosa moist Cardiovascular: No JVD. S1-S2 present, rhythmic, no gallops, rubs, or murmurs. Bilateral +/++ edema lower extremity. Pulmonary: positive breath sounds bilaterally, shallow breathing Gastrointestinal. Abdomen soft and non tender Skin. No rashes Musculoskeletal: no joint deformities     Data Reviewed: I have personally reviewed following labs and imaging studies  CBC: Recent Labs  Lab  08/04/21 0329 08/05/21 0306 08/06/21 0317 08/07/21 0252 08/08/21 0300  WBC 6.8 6.4 7.4 7.2 6.5  HGB 11.4* 11.5* 11.5* 11.5* 11.0*  HCT 35.6* 35.8* 35.1* 34.8* 33.3*  MCV 96.0 95.0 94.1 95.1 94.3  PLT 91* 89* 96* 91* 89*   Basic Metabolic Panel: Recent Labs  Lab 08/04/21 0329 08/05/21 0306 08/06/21 0317 08/07/21 0252 08/08/21 0300  NA 137 134* 138 134* 133*  K 4.2 4.2 5.1 4.3 3.8  CL 96* 95* 98 97* 99  CO2 35* 32 33* 30 29  GLUCOSE 133* 96 118* 151* 115*  BUN 21 21 20 21 19   CREATININE 1.00 0.88 0.87 0.88 0.96  CALCIUM 8.0* 8.0* 8.3* 8.0* 7.5*  MG 2.2 2.2 2.2 2.1 2.1   GFR: Estimated Creatinine Clearance: 78.7 mL/min (by C-G formula based on SCr of 0.96  mg/dL). Liver Function Tests: No results for input(s): AST, ALT, ALKPHOS, BILITOT, PROT, ALBUMIN in the last 168 hours. No results for input(s): LIPASE, AMYLASE in the last 168 hours. No results for input(s): AMMONIA in the last 168 hours. Coagulation Profile: No results for input(s): INR, PROTIME in the last 168 hours. Cardiac Enzymes: No results for input(s): CKTOTAL, CKMB, CKMBINDEX, TROPONINI in the last 168 hours. BNP (last 3 results) No results for input(s): PROBNP in the last 8760 hours. HbA1C: No results for input(s): HGBA1C in the last 72 hours. CBG: Recent Labs  Lab 08/07/21 0748 08/07/21 1137 08/07/21 1629 08/07/21 2117 08/08/21 0753  GLUCAP 115* 166* 164* 160* 105*   Lipid Profile: No results for input(s): CHOL, HDL, LDLCALC, TRIG, CHOLHDL, LDLDIRECT in the last 72 hours. Thyroid Function Tests: No results for input(s): TSH, T4TOTAL, FREET4, T3FREE, THYROIDAB in the last 72 hours. Anemia Panel: No results for input(s): VITAMINB12, FOLATE, FERRITIN, TIBC, IRON, RETICCTPCT in the last 72 hours.    Radiology Studies: I have reviewed all of the imaging during this hospital visit personally     Scheduled Meds:  (feeding supplement) PROSource Plus  30 mL Oral Daily   budesonide (PULMICORT) nebulizer solution  0.5 mg Nebulization BID   buPROPion  150 mg Oral Daily   chlorhexidine  15 mL Mouth Rinse BID   Chlorhexidine Gluconate Cloth  6 each Topical Daily   enoxaparin (LOVENOX) injection  110 mg Subcutaneous Q12H   feeding supplement  237 mL Oral Q24H   fluticasone  2 spray Each Nare Daily   insulin aspart  0-15 Units Subcutaneous TID WC   insulin aspart  0-5 Units Subcutaneous QHS   insulin aspart  3 Units Subcutaneous TID WC   insulin glargine-yfgn  15 Units Subcutaneous QHS   ipratropium-albuterol  3 mL Inhalation BID   loratadine  10 mg Oral Daily   mouth rinse  15 mL Mouth Rinse q12n4p   multivitamin with minerals  1 tablet Oral Daily   oxybutynin  10  mg Oral Daily   pantoprazole  40 mg Oral Daily   predniSONE  40 mg Oral Q breakfast   simvastatin  20 mg Oral q1800   sodium chloride flush  3 mL Intravenous Q12H   tamsulosin  0.4 mg Oral BID   Continuous Infusions:  lactated ringers 75 mL/hr at 08/07/21 1800     LOS: 15 days        Calton Harshfield Gerome Apley, MD

## 2021-08-08 NOTE — Plan of Care (Signed)
  Problem: Clinical Measurements: Goal: Respiratory complications will improve Outcome: Progressing   Problem: Coping: Goal: Level of anxiety will decrease Outcome: Progressing   

## 2021-08-09 LAB — GLUCOSE, CAPILLARY
Glucose-Capillary: 113 mg/dL — ABNORMAL HIGH (ref 70–99)
Glucose-Capillary: 176 mg/dL — ABNORMAL HIGH (ref 70–99)
Glucose-Capillary: 162 mg/dL — ABNORMAL HIGH (ref 70–99)
Glucose-Capillary: 193 mg/dL — ABNORMAL HIGH (ref 70–99)

## 2021-08-09 MED ORDER — ALPRAZOLAM 1 MG PO TABS
1.0000 mg | ORAL_TABLET | Freq: Three times a day (TID) | ORAL | Status: DC | PRN
  Administered 2021-08-09 – 2021-08-21 (×25): 1 mg via ORAL
  Filled 2021-08-09 (×25): qty 1

## 2021-08-09 MED ORDER — METOPROLOL TARTRATE 50 MG PO TABS
50.0000 mg | ORAL_TABLET | Freq: Two times a day (BID) | ORAL | Status: DC
  Administered 2021-08-09 – 2021-08-25 (×19): 50 mg via ORAL
  Filled 2021-08-09: qty 2
  Filled 2021-08-09 (×3): qty 1
  Filled 2021-08-09: qty 2
  Filled 2021-08-09 (×11): qty 1
  Filled 2021-08-09: qty 2
  Filled 2021-08-09 (×2): qty 1
  Filled 2021-08-09: qty 2
  Filled 2021-08-09 (×7): qty 1

## 2021-08-09 NOTE — Evaluation (Signed)
Occupational Therapy Evaluation Patient Details Name: Jacob Bishop MRN: [id number] DOB: July 12, 1941 Today's Date: 08/09/2021   History of Present Illness 80 yo male admitted with PE, LE DVT. Hx of COVID, O2 dep at baseline-2,  CAD, OSA, DM2, HTN, thrombocytopenia, liver and lung lesion concerning for metastatic disease admitted for shortness of breath found to have acute PE.  Due to metastatic lesion, IR consulted for biopsy.  Unable to perform liver biopsy as it was thought it could be hemangioma.  Patient needs lung biopsy once breathing is stabilized.  Seen by pulmonary.  Palliative care team consulted.  Concerns of metastatic spread.   Clinical Impression   Mr. Jacob Bishop is a 80 year old man who presents with impaired cardiopulmonary endurance on 5 L HFNC, generalized weakness, decreased activity tolerance, impaired balance and complaints of increased RLE weakness. On evaluation he required max assist to transfer to side of bed, mod assist to rise from elevated bed height and min assist to transfer to recliner. Patient requiring max-total assist for LB ADLs and set up for UB ADLs. Patient's o2 sat dropped to 88% after transfer but recovered with cues to deep breathe. Patient will benefit from skilled OT services while in hospital to improve deficits and learn compensatory strategies as needed in order to return to PLOF.        Recommendations for follow up therapy are one component of a multi-disciplinary discharge planning process, led by the attending physician.  Recommendations may be updated based on patient status, additional functional criteria and insurance authorization.   Follow Up Recommendations  Skilled nursing-short term rehab (<3 hours/day)    Assistance Recommended at Discharge Frequent or constant Supervision/Assistance  Functional Status Assessment  Patient has had a recent decline in their functional status and demonstrates the ability to make significant improvements in  function in a reasonable and predictable amount of time.  Equipment Recommendations   (TBD)    Recommendations for Other Services       Precautions / Restrictions Precautions Precautions: Fall Precaution Comments: monitor O2-currently on HFNC, reporting new RLE weakness Restrictions Weight Bearing Restrictions: No      Mobility Bed Mobility Overal bed mobility: Needs Assistance Bed Mobility: Supine to Sit     Supine to sit: Max assist;HOB elevated;+2 for safety/equipment          Transfers Overall transfer level: Needs assistance Equipment used: Rolling walker (2 wheels) Transfers: Sit to/from Stand Sit to Stand: Mod assist;From elevated surface     Step pivot transfers: Min assist;+2 safety/equipment     General transfer comment: Mod assist to rise with walker. Initially unable to come fully into standing due to complaints of RLE weakness. Increased the height of the bed and patient able to stand with mod assist and take steps to recliner with min assist.      Balance Overall balance assessment: Needs assistance Sitting-balance support: No upper extremity supported;Feet supported Sitting balance-Leahy Scale: Fair     Standing balance support: Bilateral upper extremity supported Standing balance-Leahy Scale: Poor                             ADL either performed or assessed with clinical judgement   ADL Overall ADL's : Needs assistance/impaired Eating/Feeding: Set up;Sitting   Grooming: Set up;Sitting   Upper Body Bathing: Set up;Sitting   Lower Body Bathing: Maximal assistance;Sit to/from stand   Upper Body Dressing : Set up;Sitting   Lower Body  Dressing: Sit to/from stand;Total assistance   Toilet Transfer: Minimal assistance;+2 for safety/equipment;BSC/3in1 Toilet Transfer Details (indicate cue type and reason): +2 for safety to transfer, reporting RLE weaker today Toileting- Clothing Manipulation and Hygiene: Total assistance                Vision Patient Visual Report: No change from baseline       Perception     Praxis      Pertinent Vitals/Pain Pain Assessment: Faces Faces Pain Scale: Hurts a little bit Pain Location: R knee (after transfer ) Pain Descriptors / Indicators: Discomfort;Sore Pain Intervention(s): Limited activity within patient's tolerance     Hand Dominance Right   Extremity/Trunk Assessment Upper Extremity Assessment Upper Extremity Assessment: RUE deficits/detail;LUE deficits/detail RUE Deficits / Details: WFL ROM, 3+/5 shoulder strength, 4/5 elbow strength, 5/5 wrist, grip 4-/5 RUE Sensation: WNL RUE Coordination: WNL LUE Deficits / Details: WFL ROM, 3+/5 shoulder strength, 4/5 elbow strength, 5/5 wrist, grip 4-/5 LUE Sensation: WNL LUE Coordination: WNL   Lower Extremity Assessment Lower Extremity Assessment: Defer to PT evaluation   Cervical / Trunk Assessment Cervical / Trunk Assessment: Normal   Communication Communication Communication: No difficulties   Cognition Arousal/Alertness: Awake/alert Behavior During Therapy: WFL for tasks assessed/performed Overall Cognitive Status: Within Functional Limits for tasks assessed                                       General Comments       Exercises     Shoulder Instructions      Home Living Family/patient expects to be discharged to:: Skilled nursing facility Living Arrangements: Spouse/significant other Available Help at Discharge: Family;Available 24 hours/day Type of Home: House Home Access: Stairs to enter [employer] of Steps: 3   Home Layout: Two level;Bed/bath upstairs Alternate Level Stairs-Number of Steps: flight, pt has stair lift   Bathroom Shower/Tub: Walk-in shower         Home Equipment: Conservation officer, nature (2 wheels);Cane - single point          Prior Functioning/Environment Prior Level of Function : Needs assist             Mobility Comments: using RW for  household ambulation ADLs Comments: wife assist with bathing, dressing        OT Problem List: Decreased strength;Decreased activity tolerance;Impaired balance (sitting and/or standing);Decreased knowledge of use of DME or AE;Cardiopulmonary status limiting activity;Pain;Obesity      OT Treatment/Interventions: Self-care/ADL training;Therapeutic exercise;DME and/or AE instruction;Therapeutic activities;Balance training;Patient/family education    OT Goals(Current goals can be found in the care plan section) Acute Rehab OT Goals Patient Stated Goal: to walk to bathroom OT Goal Formulation: With patient Time For Goal Achievement: 08/23/21 Potential to Achieve Goals: Good  OT Frequency: Min 2X/week   Barriers to D/C:            Co-evaluation              AM-PAC OT "6 Clicks" Daily Activity     Outcome Measure Help from another person eating meals?: A Little Help from another person taking care of personal grooming?: A Little Help from another person toileting, which includes using toliet, bedpan, or urinal?: Total Help from another person bathing (including washing, rinsing, drying)?: A Lot Help from another person to put on and taking off regular upper body clothing?: A Lot Help from another person to put on and  taking off regular lower body clothing?: A Lot 6 Click Score: 13   End of Session Equipment Utilized During Treatment: Gait belt;Rolling walker (2 wheels) Nurse Communication: Mobility status  Activity Tolerance: Patient tolerated treatment well Patient left: in chair;with call bell/phone within reach  OT Visit Diagnosis: Unsteadiness on feet (R26.81);Muscle weakness (generalized) (M62.81)                Time: 6286-3817 OT Time Calculation (min): 19 min Charges:  OT General Charges $OT Visit: 1 Visit OT Evaluation $OT Eval Low Complexity: 1 Low  Hugh Kamara, OTR/L [place]  Office [phone] Pager: [place] 08/09/2021, 2:22 PM

## 2021-08-09 NOTE — Progress Notes (Signed)
PT Cancellation Note  Patient Details Name: Jacob Bishop MRN: [id number] DOB: 09/21/1940   Cancelled Treatment:    Reason Eval/Treat Not Completed: Other (comment) - upon PT arrival to room at 1518, pt just had gotten back to bed with +3 in stedy, too fatigued to participate in PT. Will check back.  Stacie Glaze, PT DPT Acute Rehabilitation Services Pager [phone]  Office [phone]    Louis Matte 08/09/2021, 4:09 PM

## 2021-08-09 NOTE — Plan of Care (Signed)
°  Problem: Education: Goal: Knowledge of General Education information will improve Description: Including pain rating scale, medication(s)/side effects and non-pharmacologic comfort measures Outcome: Progressing   Problem: Health Behavior/Discharge Planning: Goal: Ability to manage health-related needs will improve Outcome: Progressing   Problem: Clinical Measurements: Goal: Ability to maintain clinical measurements within normal limits will improve Outcome: Progressing Goal: Will remain free from infection Outcome: Progressing Goal: Diagnostic test results will improve Outcome: Progressing Goal: Respiratory complications will improve Outcome: Progressing Goal: Cardiovascular complication will be avoided Outcome: Progressing   Problem: Nutrition: Goal: Adequate nutrition will be maintained Outcome: Progressing   Problem: Coping: Goal: Level of anxiety will decrease Outcome: Progressing   Cindy S. Brigitte Pulse BSN, RN, [place] 08/09/2021 2:26 AM

## 2021-08-09 NOTE — Progress Notes (Signed)
PROGRESS NOTE    Jacob Bishop  CZY:[id number] DOB: 1940/09/06 DOA: 07/24/2021 PCP: [name], NP    Brief Narrative:  Jacob Bishop was admitted to the hospital with the working diagnosis of acute hypoxemic respiratory failure, in the setting of acute pulmonary embolism.    80 year old male past medical history for coronary artery disease, type 2 diabetes mellitus, and obstructive sleep apnea. He also has lung nodules and liver lesions.  Recent hospitalization 11/5-11/03/2021 for acute hypoxic respiratory failure due to SARS COVID-19 viral pneumonia.  By time of his discharge he was still very weak and deconditioned, but not until a few days prior to his rehospitalization he developed worsening dyspnea that prompted him to come back to the hospital.  On his initial physical examination his oximetry was in the low 90s on 5 L supplemental oxygen per nasal cannula, respiratory rate 26-31, heart rate 103, blood pressure 128/64, his lungs had no wheezing, heart S1-S2, present, rhythmic, abdomen soft nontender, positive right lower extremity edema.   Sodium 135, potassium 3.6, chloride 93, bicarb 32, glucose 143, BUN 12, creatinine 0.89, white count 7.0, hemoglobin 13.2, hematocrit 39.7, platelets 118. SARS COVID-19 negative.   Chest radiograph with hyperinflation.   CT chest with small segmental arterial embolus in the right upper lobe.  Multiple mildly enlarged mediastinal and hilar lymph nodes.  Numerous bilateral lung masses with several small new nodules without cavitation and with interval enlargement of pre-existing nodules.   EKG 85 bpm, normal axis, normal intervals, sinus rhythm, no significant ST segment changes, negative T wave lead III-aVF.   Patient was placed on heparin for anticoagulation. Unable to do biopsy due to respiratory failure. Was placed on systemic corticosteroids along with bronchodilators.   12/12 patient had worsening respiratory failure, transferred to stepdown unit  for noninvasive mechanical ventilation. Patient with metastatic lesions, palliative care was consulted.   CODE STATUS changed to DNR/DNI.   Assessment & Plan:   Principal Problem:   Acute pulmonary embolus (HCC) Active Problems:   Obstructive sleep apnea   Essential hypertension   CAD, NATIVE VESSEL   Thrombocytopenia (HCC)   Mass of lung   Lesion of liver   Acute on chronic respiratory failure with hypoxia (HCC)   DM type 2 with diabetic mixed hyperlipidemia (HCC)   Depression   Pressure injury of skin   Chest pain   Palliative care by specialist     Acute on chronic hypoxemic respiratory failure, post SARS covid 19 viral pneumonia, acute pulmonary embolism, bilateral pulmonary nodules (worsening). Right lower extremity DVT.  Patient not sable for biopsy, but malignancy in the differential. (Metastatic disease).   Patient this am is out of bed to chair, continue to have dyspnea with minimal efforts.  Oxygenation is 96% on 6 L/min per HFNC   Continue with bronchodilators and inhaled steroids. Anticoagulation with enoxaparin.  On oral prednisone 40 mg daily, will plan for a slow taper.  Decrease 02 to 5 L per min and continue oxymetry monitoring, keep oxygen saturation 88% or greater.   2. New onset atrial fibrillation. Telemetry monitor with signs of atrial fibrillation, personally reviewed.  HR 120 bpm, consistent in RVR.  Add metoprolol 50 mg po bid for rate control. Check EKG Continue anticoagulation with enoxaparin   2. T2DM/ dyslipidemia. Capillary glucose this am 113, plan to continue with insulin sliding scale and basal 15 units plus per meal 3 units.    On statin therapy.    3. OSA. Continue with bipap at  night with good toleration    4. Thrombocytopenia/ anemia of chronic disease.  Follow cell count in 48 hrs.    5. Hyponatremia.  Patient tolerating po well, no nausea or vomiting, will check renal function and electrolytes in 24 hrs.    6. Hepatic  cirrhosis with positive 4,7 low density lesion in the caudate lobe.  Possible hemangioma.    7. Stage 2 bilateral buttocks pressure ulcer (present on admission). Continue local wound care.    8. Anxiety. Change IV lorazepam to oral alprazolam as needed. Continue with bupropion.   Patient continue to be at high risk for worsening atrial fibrillation   Status is: Inpatient  Remains inpatient appropriate because: cardiac and respiratory monitoring    DVT prophylaxis: Enoxaparin   Code Status:    DNR  Family Communication:   No family at the bedside      Nutrition Status: Nutrition Problem: Increased nutrient needs Etiology: acute illness, chronic illness Signs/Symptoms: estimated needs Interventions: Ensure Enlive (each supplement provides 350kcal and 20 grams of protein), Prostat, MVI     Skin Documentation: Pressure Injury 07/25/21 Buttocks Right Stage 2 -  Partial thickness loss of dermis presenting as a shallow open injury with a red, pink wound bed without slough. (Active)  07/25/21 1530  Location: Buttocks  Location Orientation: Right  Staging: Stage 2 -  Partial thickness loss of dermis presenting as a shallow open injury with a red, pink wound bed without slough.  Wound Description (Comments):   Present on Admission: Yes     Pressure Injury 07/25/21 Buttocks Left Stage 2 -  Partial thickness loss of dermis presenting as a shallow open injury with a red, pink wound bed without slough. (Active)  07/25/21 1531  Location: Buttocks  Location Orientation: Left  Staging: Stage 2 -  Partial thickness loss of dermis presenting as a shallow open injury with a red, pink wound bed without slough.  Wound Description (Comments):   Present on Admission: Yes     Pressure Injury 08/06/21 Sacrum Medial Deep Tissue Pressure Injury - Purple or maroon localized area of discolored intact skin or blood-filled blister due to damage of underlying soft tissue from pressure and/or shear.  (Active)  08/06/21 1600  Location: Sacrum  Location Orientation: Medial  Staging: Deep Tissue Pressure Injury - Purple or maroon localized area of discolored intact skin or blood-filled blister due to damage of underlying soft tissue from pressure and/or shear.  Wound Description (Comments):   Present on Admission: No     Consultants:  Pulmonary     Subjective: Patient with no nausea or vomiting, this am is out of bed to the chair, tolerating po well. Positive anxiety and palpitations.   Objective: Vitals:   08/09/21 0000 08/09/21 0400 08/09/21 0800 08/09/21 0802  BP: (!) 100/49 (!) 105/47  (!) 123/48  Pulse: 73 77  (!) 117  Resp: 17 16  (!) 36  Temp: 98 F (36.7 C) 98.2 F (36.8 C)    TempSrc: Axillary Axillary    SpO2: 96% 96% 95% 96%  Weight:      Height:        Intake/Output Summary (Last 24 hours) at 08/09/2021 0835 Last data filed at 08/09/2021 0600 Gross per 24 hour  Intake 1988.84 ml  Output 1600 ml  Net 388.84 ml   Filed Weights   07/25/21 1500 07/28/21 0414 08/03/21 1625  Weight: 110 kg 110 kg 106.2 kg    Examination:   General:  deconditioned and dyspnea at  rest,  Neurology: Awake and alert, non focal  E ENT: mld pallor, no icterus, oral mucosa moist Cardiovascular: No JVD. S1-S2 present, irregularly irregular with no gallops, rubs, or murmurs. ++ non pitting bilateral lower extremity edema. Pulmonary: positive breath sounds bilaterally, with no wheezing, rhonchi or rales. Gastrointestinal. Abdomen soft and non tender Skin. No rashes Musculoskeletal: no joint deformities     Data Reviewed: I have personally reviewed following labs and imaging studies  CBC: Recent Labs  Lab 08/04/21 0329 08/05/21 0306 08/06/21 0317 08/07/21 0252 08/08/21 0300  WBC 6.8 6.4 7.4 7.2 6.5  HGB 11.4* 11.5* 11.5* 11.5* 11.0*  HCT 35.6* 35.8* 35.1* 34.8* 33.3*  MCV 96.0 95.0 94.1 95.1 94.3  PLT 91* 89* 96* 91* 89*   Basic Metabolic Panel: Recent Labs  Lab  08/04/21 0329 08/05/21 0306 08/06/21 0317 08/07/21 0252 08/08/21 0300  NA 137 134* 138 134* 133*  K 4.2 4.2 5.1 4.3 3.8  CL 96* 95* 98 97* 99  CO2 35* 32 33* 30 29  GLUCOSE 133* 96 118* 151* 115*  BUN 21 21 20 21 19   CREATININE 1.00 0.88 0.87 0.88 0.96  CALCIUM 8.0* 8.0* 8.3* 8.0* 7.5*  MG 2.2 2.2 2.2 2.1 2.1   GFR: Estimated Creatinine Clearance: 78.7 mL/min (by C-G formula based on SCr of 0.96 mg/dL). Liver Function Tests: No results for input(s): AST, ALT, ALKPHOS, BILITOT, PROT, ALBUMIN in the last 168 hours. No results for input(s): LIPASE, AMYLASE in the last 168 hours. No results for input(s): AMMONIA in the last 168 hours. Coagulation Profile: No results for input(s): INR, PROTIME in the last 168 hours. Cardiac Enzymes: No results for input(s): CKTOTAL, CKMB, CKMBINDEX, TROPONINI in the last 168 hours. BNP (last 3 results) No results for input(s): PROBNP in the last 8760 hours. HbA1C: No results for input(s): HGBA1C in the last 72 hours. CBG: Recent Labs  Lab 08/08/21 0753 08/08/21 1159 08/08/21 1651 08/08/21 2133 08/09/21 0740  GLUCAP 105* 181* 161* 211* 113*   Lipid Profile: No results for input(s): CHOL, HDL, LDLCALC, TRIG, CHOLHDL, LDLDIRECT in the last 72 hours. Thyroid Function Tests: No results for input(s): TSH, T4TOTAL, FREET4, T3FREE, THYROIDAB in the last 72 hours. Anemia Panel: No results for input(s): VITAMINB12, FOLATE, FERRITIN, TIBC, IRON, RETICCTPCT in the last 72 hours.    Radiology Studies: I have reviewed all of the imaging during this hospital visit personally     Scheduled Meds:  (feeding supplement) PROSource Plus  30 mL Oral Daily   budesonide (PULMICORT) nebulizer solution  0.5 mg Nebulization BID   buPROPion  150 mg Oral Daily   chlorhexidine  15 mL Mouth Rinse BID   Chlorhexidine Gluconate Cloth  6 each Topical Daily   enoxaparin (LOVENOX) injection  110 mg Subcutaneous Q12H   feeding supplement  237 mL Oral Q24H    insulin aspart  0-15 Units Subcutaneous TID WC   insulin aspart  0-5 Units Subcutaneous QHS   insulin aspart  3 Units Subcutaneous TID WC   insulin glargine-yfgn  15 Units Subcutaneous QHS   ipratropium-albuterol  3 mL Inhalation BID   loratadine  10 mg Oral Daily   mouth rinse  15 mL Mouth Rinse q12n4p   multivitamin with minerals  1 tablet Oral Daily   oxybutynin  10 mg Oral Daily   pantoprazole  40 mg Oral Daily   predniSONE  40 mg Oral Q breakfast   simvastatin  20 mg Oral q1800   sodium chloride flush  3  mL Intravenous Q12H   tamsulosin  0.4 mg Oral BID   Continuous Infusions:   LOS: 16 days        Dakiya Puopolo Gerome Apley, MD

## 2021-08-10 LAB — GLUCOSE, CAPILLARY
Glucose-Capillary: 103 mg/dL — ABNORMAL HIGH (ref 70–99)
Glucose-Capillary: 139 mg/dL — ABNORMAL HIGH (ref 70–99)
Glucose-Capillary: 196 mg/dL — ABNORMAL HIGH (ref 70–99)
Glucose-Capillary: 225 mg/dL — ABNORMAL HIGH (ref 70–99)

## 2021-08-10 MED ORDER — APIXABAN 5 MG PO TABS
5.0000 mg | ORAL_TABLET | Freq: Two times a day (BID) | ORAL | Status: DC
  Administered 2021-08-10 – 2021-08-25 (×30): 5 mg via ORAL
  Filled 2021-08-10 (×30): qty 1

## 2021-08-10 MED ORDER — PREDNISONE 20 MG PO TABS
20.0000 mg | ORAL_TABLET | Freq: Every day | ORAL | Status: DC
  Administered 2021-08-11 – 2021-08-15 (×5): 20 mg via ORAL
  Filled 2021-08-10 (×5): qty 1

## 2021-08-10 NOTE — Progress Notes (Signed)
Physical Therapy Treatment Patient Details Name: Jacob Bishop MRN: [id number] DOB: 01/31/1941 Today's Date: 08/10/2021   History of Present Illness 80 year old male past medical history for coronary artery disease, type 2 diabetes mellitus, and obstructive sleep apnea. He also has lung nodules and liver lesions.  Recent hospitalization 11/5-11/03/2021 for acute hypoxic respiratory failure due to SARS COVID-19 viral pneumonia.  By time of his discharge he was still very weak and deconditioned, but not until a few days prior to his rehospitalization he developed worsening dyspnea that prompted him to come back to the hospital.  On his initial physical examination his oximetry was in the low 90s on 5 L supplemental oxygen per nasal cannula, respiratory rate 26-31, heart rate 103, blood pressure 128/64, his lungs had no wheezing, heart S1-S2, present, rhythmic, abdomen soft nontender, positive right lower extremity edema.     Sodium 135, potassium 3.6, chloride 93, bicarb 32, glucose 143, BUN 12, creatinine 0.89, white count 7.0, hemoglobin 13.2, hematocrit 39.7, platelets 118.  SARS COVID-19 negative.     Chest radiograph with hyperinflation.     CT chest with small segmental arterial embolus in the right upper lobe.  Multiple mildly enlarged mediastinal and hilar lymph nodes.  Numerous bilateral lung masses with several small new nodules without cavitation and with interval enlargement of pre-existing nodules.     EKG 85 bpm, normal axis, normal intervals, sinus rhythm, no significant ST segment changes, negative T wave lead III-aVF.     Patient was placed on heparin for anticoagulation.  Unable to do biopsy due to respiratory failure.  Was placed on systemic corticosteroids along with bronchodilators.     12/12 patient had worsening respiratory failure, transferred to stepdown unit for noninvasive mechanical ventilation.  Patient with metastatic lesions, palliative care was consulted.     CODE STATUS changed to  DNR/DNI.     12/16 patient developed atrial fibrillation with RVR, placed on metoprolol with good response.    PT Comments    AxO x 3 very pleasant but very weak.  Assisted OOB was difficult.  General bed mobility comments: Max Assist for upper body support supine to sit plus use of bed pad to complete scooting to EOB.  General transfer comment: pt required increased assist to rise and complete 1/4 pivot turn to [facility].  Max c/o R LE weakness "bad knee".  Assisted of BSC + 2 assist then switched BSC with recliner from behind as pt was unable to attempt another pivot/steps due to fatigue.  General Gait Details: unable to attempt amb due to poor transfer ability.  Increased weakness and decreased self ability to support his weight. Profoundly weak.   Pt will need ST Rehab at SNF prior to returning home.   Recommendations for follow up therapy are one component of a multi-disciplinary discharge planning process, led by the attending physician.  Recommendations may be updated based on patient status, additional functional criteria and insurance authorization.  Follow Up Recommendations  Skilled nursing-short term rehab (<3 hours/day)     Assistance Recommended at Discharge Frequent or constant Supervision/Assistance  Equipment Recommendations  None recommended by PT    Recommendations for Other Services       Precautions / Restrictions Precautions Precautions: Fall Precaution Comments: "Bad" right knee     Mobility  Bed Mobility Overal bed mobility: Needs Assistance Bed Mobility: Supine to Sit     Supine to sit: Max assist;HOB elevated;+2 for safety/equipment     General bed mobility comments: Max Assist for  upper body support supine to sit plus use of bed pad to complete scooting to EOB.    Transfers Overall transfer level: Needs assistance Equipment used: Rolling walker (2 wheels) Transfers: Sit to/from Stand Sit to Stand: Mod assist;Max assist;+2 physical assistance;+2  safety/equipment           General transfer comment: pt required increased assist to rise and complete 1/4 pivot turn to [facility].  Max c/o R LE weakness "bad knee".  Assisted of BSC + 2 assist then switched BSC with recliner from behind as pt was unable to attempt another pivot/steps due to fatigue.    Ambulation/Gait         Gait velocity: decreased     General Gait Details: unable to attempt amb due to poor transfer ability.  Increased weakness and decreased self ability to support his weight.   Stairs             Wheelchair Mobility    Modified Rankin (Stroke Patients Only)       Balance                                            Cognition Arousal/Alertness: Awake/alert Behavior During Therapy: WFL for tasks assessed/performed                                   General Comments: AxO x 3 very pleasant and willing but weak        Exercises      General Comments        Pertinent Vitals/Pain Pain Assessment: Faces    Home Living                          Prior Function            PT Goals (current goals can now be found in the care plan section) Progress towards PT goals: Progressing toward goals    Frequency    Min 2X/week      PT Plan Current plan remains appropriate    Co-evaluation              AM-PAC PT "6 Clicks" Mobility   Outcome Measure  Help needed turning from your back to your side while in a flat bed without using bedrails?: A Lot Help needed moving from lying on your back to sitting on the side of a flat bed without using bedrails?: A Lot Help needed moving to and from a bed to a chair (including a wheelchair)?: A Lot Help needed standing up from a chair using your arms (e.g., wheelchair or bedside chair)?: A Lot Help needed to walk in hospital room?: Total Help needed climbing 3-5 steps with a railing? : Total 6 Click Score: 10    End of Session Equipment Utilized  During Treatment: Gait belt Activity Tolerance: Patient limited by fatigue Patient left: in chair;with chair alarm set;with call bell/phone within reach Nurse Communication: Mobility status PT Visit Diagnosis: Muscle weakness (generalized) (M62.81);Difficulty in walking, not elsewhere classified (R26.2)     Time: 8101-7510 PT Time Calculation (min) (ACUTE ONLY): 24 min  Charges:  $Therapeutic Activity: 23-37 mins                     Rica Koyanagi  PTA Acute  Rehabilitation Services Pager      [phone] Office      [phone]

## 2021-08-10 NOTE — Progress Notes (Signed)
PROGRESS NOTE    Jacob Bishop  ASN:[id number] DOB: [birth year] DOA: 07/24/2021 PCP: [name], NP    Brief Narrative:  Mr. Crass was admitted to the hospital with the working diagnosis of acute hypoxemic respiratory failure, in the setting of acute pulmonary embolism.    80 year old male past medical history for coronary artery disease, type 2 diabetes mellitus, and obstructive sleep apnea. He also has lung nodules and liver lesions.  Recent hospitalization 11/5-11/03/2021 for acute hypoxic respiratory failure due to SARS COVID-19 viral pneumonia.  By time of his discharge he was still very weak and deconditioned, but not until a few days prior to his rehospitalization he developed worsening dyspnea that prompted him to come back to the hospital.  On his initial physical examination his oximetry was in the low 90s on 5 L supplemental oxygen per nasal cannula, respiratory rate 26-31, heart rate 103, blood pressure 128/64, his lungs had no wheezing, heart S1-S2, present, rhythmic, abdomen soft nontender, positive right lower extremity edema.   Sodium 135, potassium 3.6, chloride 93, bicarb 32, glucose 143, BUN 12, creatinine 0.89, white count 7.0, hemoglobin 13.2, hematocrit 39.7, platelets 118. SARS COVID-19 negative.   Chest radiograph with hyperinflation.   CT chest with small segmental arterial embolus in the right upper lobe.  Multiple mildly enlarged mediastinal and hilar lymph nodes.  Numerous bilateral lung masses with several small new nodules without cavitation and with interval enlargement of pre-existing nodules.   EKG 85 bpm, normal axis, normal intervals, sinus rhythm, no significant ST segment changes, negative T wave lead III-aVF.   Patient was placed on heparin for anticoagulation. Unable to do biopsy due to respiratory failure. Was placed on systemic corticosteroids along with bronchodilators.   12/12 patient had worsening respiratory failure, transferred to stepdown unit  for noninvasive mechanical ventilation. Patient with metastatic lesions, palliative care was consulted.   CODE STATUS changed to DNR/DNI.   12/16 patient developed atrial fibrillation with RVR, placed on metoprolol with good response.    Assessment & Plan:   Principal Problem:   Acute pulmonary embolus (HCC) Active Problems:   Obstructive sleep apnea   Essential hypertension   CAD, NATIVE VESSEL   Thrombocytopenia (HCC)   Mass of lung   Lesion of liver   Acute on chronic respiratory failure with hypoxia (HCC)   DM type 2 with diabetic mixed hyperlipidemia (HCC)   Depression   Pressure injury of skin   Chest pain   Palliative care by specialist     Acute on chronic hypoxemic respiratory failure, post SARS covid 19 viral pneumonia, acute pulmonary embolism, bilateral pulmonary nodules (worsening). Right lower extremity DVT.  Patient not sable for biopsy, but malignancy in the differential. (Metastatic disease).    Dyspnea continue to improve, oxygenation is 957% on 3 L min per HFNC.   Plan to continue with bronchodilators and inhaled steroids. Continue with anticoagulation with apixaban.  Start tapering prednisone to 20 mg daily, for another 7 days.   Continue to encourage mobility and out of bed to chair.  Patient will need SNF.   2. New onset atrial fibrillation.  Improved heart rate with metoprolol, telemetry personally reviewed and has been in the 60 to 70. Continue metoprolol 50 mg po bid for rate control  Plan to continue anticoagulation with apixaban  Ok to transfer patient to telemetry    2. T2DM/ dyslipidemia.  Continue insulin sliding scale and basal 15 units plus per meal 3 units.   Glucose has been well  controlled.  Continue with statin therapy.    3. OSA/ obesity class 1. . On bipap at night with good toleration   calculated BMI is 32.6  4. Thrombocytopenia/ anemia of chronic disease.  Follow cell count in 48 hrs.    5. Hyponatremia.  Follow up  renal function in am.    6. Hepatic cirrhosis with positive 4,7 low density lesion in the caudate lobe.  Possible hemangioma.  Will need outpatient follow up.    7. Stage 2 bilateral buttocks pressure ulcer (present on admission). Continue local wound care.    8. Anxiety.  Continue with as needed oral alprazolam and scheduled  bupropion    Patient continue to be at high risk for worsening respiratory failure   Status is: Inpatient  Remains inpatient appropriate because: oxymetry monitoring    DVT prophylaxis: Apixaban   Code Status:    DNR   Family Communication:   No family at the bedside      Nutrition Status: Nutrition Problem: Increased nutrient needs Etiology: acute illness, chronic illness Signs/Symptoms: estimated needs Interventions: Ensure Enlive (each supplement provides 350kcal and 20 grams of protein), Prostat, MVI     Skin Documentation: Pressure Injury 07/25/21 Buttocks Right Stage 2 -  Partial thickness loss of dermis presenting as a shallow open injury with a red, pink wound bed without slough. (Active)  07/25/21 1530  Location: Buttocks  Location Orientation: Right  Staging: Stage 2 -  Partial thickness loss of dermis presenting as a shallow open injury with a red, pink wound bed without slough.  Wound Description (Comments):   Present on Admission: Yes     Pressure Injury 07/25/21 Buttocks Left Stage 2 -  Partial thickness loss of dermis presenting as a shallow open injury with a red, pink wound bed without slough. (Active)  07/25/21 1531  Location: Buttocks  Location Orientation: Left  Staging: Stage 2 -  Partial thickness loss of dermis presenting as a shallow open injury with a red, pink wound bed without slough.  Wound Description (Comments):   Present on Admission: Yes     Pressure Injury 08/06/21 Sacrum Medial Deep Tissue Pressure Injury - Purple or maroon localized area of discolored intact skin or blood-filled blister due to damage of  underlying soft tissue from pressure and/or shear. (Active)  08/06/21 1600  Location: Sacrum  Location Orientation: Medial  Staging: Deep Tissue Pressure Injury - Purple or maroon localized area of discolored intact skin or blood-filled blister due to damage of underlying soft tissue from pressure and/or shear.  Wound Description (Comments):   Present on Admission: No     Subjective: Patient is feeling better but not back to baseline continue to have dyspnea, worse with exertion, no nausea or vomiting. No chest pain or further palpitation   Objective: Vitals:   08/10/21 0624 08/10/21 0800 08/10/21 0805 08/10/21 0856  BP: (!) 107/47 (!) 101/44    Pulse: (!) 138 85    Resp: (!) 24 (!) 26    Temp:    98 F (36.7 C)  TempSrc:    Oral  SpO2: 90% 97% 97%   Weight:      Height:        Intake/Output Summary (Last 24 hours) at 08/10/2021 0959 Last data filed at 08/10/2021 0600 Gross per 24 hour  Intake 830 ml  Output 600 ml  Net 230 ml   Filed Weights   07/25/21 1500 07/28/21 0414 08/03/21 1625  Weight: 110 kg 110 kg 106.2 kg  Examination:   General: Not in pain or dyspnea, deconditioned  Neurology: Awake and alert, non focal  E ENT: mild pallor, no icterus, oral mucosa moist Cardiovascular: No JVD. S1-S2 present, rhythmic, no gallops, rubs, or murmurs. ++ bilateral non pitting lower extremity edema. Pulmonary: positive breath sounds bilaterally, with no wheezing, rhonchi or rales. Gastrointestinal. Abdomen soft and non tender Skin. No rashes Musculoskeletal: no joint deformities     Data Reviewed: I have personally reviewed following labs and imaging studies  CBC: Recent Labs  Lab 08/04/21 0329 08/05/21 0306 08/06/21 0317 08/07/21 0252 08/08/21 0300  WBC 6.8 6.4 7.4 7.2 6.5  HGB 11.4* 11.5* 11.5* 11.5* 11.0*  HCT 35.6* 35.8* 35.1* 34.8* 33.3*  MCV 96.0 95.0 94.1 95.1 94.3  PLT 91* 89* 96* 91* 89*   Basic Metabolic Panel: Recent Labs  Lab 08/04/21 0329  08/05/21 0306 08/06/21 0317 08/07/21 0252 08/08/21 0300  NA 137 134* 138 134* 133*  K 4.2 4.2 5.1 4.3 3.8  CL 96* 95* 98 97* 99  CO2 35* 32 33* 30 29  GLUCOSE 133* 96 118* 151* 115*  BUN 21 21 20 21 19   CREATININE 1.00 0.88 0.87 0.88 0.96  CALCIUM 8.0* 8.0* 8.3* 8.0* 7.5*  MG 2.2 2.2 2.2 2.1 2.1   GFR: Estimated Creatinine Clearance: 78.7 mL/min (by C-G formula based on SCr of 0.96 mg/dL). Liver Function Tests: No results for input(s): AST, ALT, ALKPHOS, BILITOT, PROT, ALBUMIN in the last 168 hours. No results for input(s): LIPASE, AMYLASE in the last 168 hours. No results for input(s): AMMONIA in the last 168 hours. Coagulation Profile: No results for input(s): INR, PROTIME in the last 168 hours. Cardiac Enzymes: No results for input(s): CKTOTAL, CKMB, CKMBINDEX, TROPONINI in the last 168 hours. BNP (last 3 results) No results for input(s): PROBNP in the last 8760 hours. HbA1C: No results for input(s): HGBA1C in the last 72 hours. CBG: Recent Labs  Lab 08/09/21 0740 08/09/21 1120 08/09/21 1640 08/09/21 2130 08/10/21 0816  GLUCAP 113* 176* 162* 193* 103*   Lipid Profile: No results for input(s): CHOL, HDL, LDLCALC, TRIG, CHOLHDL, LDLDIRECT in the last 72 hours. Thyroid Function Tests: No results for input(s): TSH, T4TOTAL, FREET4, T3FREE, THYROIDAB in the last 72 hours. Anemia Panel: No results for input(s): VITAMINB12, FOLATE, FERRITIN, TIBC, IRON, RETICCTPCT in the last 72 hours.    Radiology Studies: I have reviewed all of the imaging during this hospital visit personally     Scheduled Meds:  (feeding supplement) PROSource Plus  30 mL Oral Daily   budesonide (PULMICORT) nebulizer solution  0.5 mg Nebulization BID   buPROPion  150 mg Oral Daily   chlorhexidine  15 mL Mouth Rinse BID   Chlorhexidine Gluconate Cloth  6 each Topical Daily   enoxaparin (LOVENOX) injection  110 mg Subcutaneous Q12H   feeding supplement  237 mL Oral Q24H   insulin aspart   0-15 Units Subcutaneous TID WC   insulin aspart  0-5 Units Subcutaneous QHS   insulin aspart  3 Units Subcutaneous TID WC   insulin glargine-yfgn  15 Units Subcutaneous QHS   ipratropium-albuterol  3 mL Inhalation BID   loratadine  10 mg Oral Daily   mouth rinse  15 mL Mouth Rinse q12n4p   metoprolol tartrate  50 mg Oral BID   multivitamin with minerals  1 tablet Oral Daily   oxybutynin  10 mg Oral Daily   pantoprazole  40 mg Oral Daily   predniSONE  40 mg Oral Q  breakfast   simvastatin  20 mg Oral q1800   sodium chloride flush  3 mL Intravenous Q12H   tamsulosin  0.4 mg Oral BID   Continuous Infusions:   LOS: 17 days        Jordis Repetto Gerome Apley, MD

## 2021-08-10 NOTE — Discharge Instructions (Signed)

## 2021-08-10 NOTE — Progress Notes (Signed)
Requesting bipap to be placed , patient states he feels little short of breath . Gave prn neub treatment , lung sounds with expiratory wheezing . Post treatment states feel better .placed on bipap , meds given for now as he states he will probably be done for this evening . Physical assess completed

## 2021-08-10 NOTE — Progress Notes (Addendum)
ANTICOAGULATION CONSULT NOTE - Initial Consult  Pharmacy Consult for apixaban Indication: pulmonary embolus  Allergies  Allergen Reactions   Lipitor [Atorvastatin Calcium] Other (See Comments)    Muscle soreness   Oxycodone Anxiety    Causes patient to become shaky and dizzy. Unable to tolerate.    Patient Measurements: Height: 5\' 11"  (180.3 cm) Weight: 106.2 kg (234 lb 2.1 oz) IBW/kg (Calculated) : 75.3  Vital Signs: Temp: 98 F (36.7 C) (12/17 0856) Temp Source: Oral (12/17 0856) BP: 101/44 (12/17 0800) Pulse Rate: 85 (12/17 0800)  Labs: Recent Labs    08/08/21 0300  HGB 11.0*  HCT 33.3*  PLT 89*  CREATININE 0.96    Estimated Creatinine Clearance: 78.7 mL/min (by C-G formula based on SCr of 0.96 mg/dL).   Medical History: Past Medical History:  Diagnosis Date   AI (aortic insufficiency) 01/11/2018   Trace, noted on ECHO   Arthritis    CAD in native artery    Nuclear stress test 10/19: EF 58, normal perfusion, low risk   Cellulitis    Dermatitis    Diabetes mellitus    type II   Diastolic dysfunction 29/51/8841   Mild, noted on ECHO   Diverticulosis 01/31/2016   ASCENDING COLON AND CECUM, NOTED ON COLONOSCOPY   DOE (dyspnea on exertion)    Edema    Fatty liver 09/18/2004   Noted on [place] ABD   History of kidney stones 05/23/2002   Noted on CT Abd   Hx of colonic polyps 01/31/2016   Hyperlipidemia    Hypertension    Internal hemorrhoids 01/31/2016   Noted on colonoscopy   Obesity    OSA (obstructive sleep apnea)    cpap   Positional vertigo    Pulmonary hypertension ([place]) 01/11/2018   Mild, noted on ECHO   Rhinosinusitis    Skin lesion    TR (tricuspid regurgitation) 01/11/2018   Trace, noted on ECHO    Medications:  No PTA anticoagulation 11/30-12/8 IV heparin 12/8-12/17 treatment dose enoxaparin 12/17 apixaban initiated  Assessment: Pharmacy consulted to dose apixaban for this 80 yo patient currently on treatment dose enoxaparin for  PE.    Goal of Therapy:  Treat PE Monitor platelets by anticoagulation protocol: Yes   Plan:  Discontinue enoxaparin Apixaban 5 mg po BID (start when enoxaparin next dose was due) Monitor CBC, signs/symptoms of bleeding   Royetta Asal, PharmD, [place] Please utilize Amion for appropriate phone number to reach the unit pharmacist ([place]) 08/10/2021 10:29 AM  Need for further dosage adjustment unlikely at present and pharmacy will sign off at this time continuing to monitor CBC, signs/symptoms of bleeding.  Please re-consult if a change in clinical status warrants re-evaluation of therapy or dosage.  Royetta Asal, PharmD, BCPS Clinical Pharmacist [place] Please utilize Amion for appropriate phone number to reach the unit pharmacist ([place]) 08/11/2021 10:16 AM

## 2021-08-11 LAB — BASIC METABOLIC PANEL
Anion gap: 8 (ref 5–15)
BUN: 22 mg/dL (ref 8–23)
CO2: 32 mmol/L (ref 22–32)
Calcium: 7.9 mg/dL — ABNORMAL LOW (ref 8.9–10.3)
Chloride: 96 mmol/L — ABNORMAL LOW (ref 98–111)
Creatinine, Ser: 0.79 mg/dL (ref 0.61–1.24)
GFR, Estimated: >60 mL/min (ref >60)
Glucose, Bld: 108 mg/dL — ABNORMAL HIGH (ref 70–99)
Potassium: 4 mmol/L (ref 3.5–5.1)
Sodium: 136 mmol/L (ref 135–145)

## 2021-08-11 LAB — GLUCOSE, CAPILLARY
Glucose-Capillary: 95 mg/dL (ref 70–99)
Glucose-Capillary: 137 mg/dL — ABNORMAL HIGH (ref 70–99)
Glucose-Capillary: 202 mg/dL — ABNORMAL HIGH (ref 70–99)
Glucose-Capillary: 92 mg/dL (ref 70–99)

## 2021-08-11 LAB — CBC
HCT: 33.2 % — ABNORMAL LOW (ref 39.0–52.0)
Hemoglobin: 10.9 g/dL — ABNORMAL LOW (ref 13.0–17.0)
MCH: 30.7 pg (ref 26.0–34.0)
MCHC: 32.8 g/dL (ref 30.0–36.0)
MCV: 93.5 fL (ref 80.0–100.0)
Platelets: 87 10*3/uL — ABNORMAL LOW (ref 150–400)
RBC: 3.55 MIL/uL — ABNORMAL LOW (ref 4.22–5.81)
RDW: 14.8 % (ref 11.5–15.5)
WBC: 6.6 10*3/uL (ref 4.0–10.5)
nRBC: 0 % (ref 0.0–0.2)

## 2021-08-11 MED ORDER — ALBUTEROL SULFATE (2.5 MG/3ML) 0.083% IN NEBU: 2.5000 mg | INHALATION_SOLUTION | RESPIRATORY_TRACT | Status: DC | PRN

## 2021-08-11 NOTE — Progress Notes (Signed)
RT tried to place patient on dreamstation for his BIPAP. Patients refused dreamstation. Patient placed back on V60 per patient request

## 2021-08-11 NOTE — Progress Notes (Signed)
PROGRESS NOTE    TEJAS SEAWOOD  XYB:[id number] DOB: 07-Feb-1941 DOA: 07/24/2021 PCP: [name], NP    Brief Narrative:  Mr. Neal was admitted to the hospital with the working diagnosis of acute hypoxemic respiratory failure, in the setting of acute pulmonary embolism.    80 year old male past medical history for coronary artery disease, type 2 diabetes mellitus, and obstructive sleep apnea. He also has lung nodules and liver lesions.  Recent hospitalization 11/5-11/03/2021 for acute hypoxic respiratory failure due to SARS COVID-19 viral pneumonia.  By time of his discharge he was still very weak and deconditioned, but not until a few days prior to his rehospitalization he developed worsening dyspnea that prompted him to come back to the hospital.  On his initial physical examination his oximetry was in the low 90s on 5 L supplemental oxygen per nasal cannula, respiratory rate 26-31, heart rate 103, blood pressure 128/64, his lungs had no wheezing, heart S1-S2, present, rhythmic, abdomen soft nontender, positive right lower extremity edema.   Sodium 135, potassium 3.6, chloride 93, bicarb 32, glucose 143, BUN 12, creatinine 0.89, white count 7.0, hemoglobin 13.2, hematocrit 39.7, platelets 118. SARS COVID-19 negative.   Chest radiograph with hyperinflation.   CT chest with small segmental arterial embolus in the right upper lobe.  Multiple mildly enlarged mediastinal and hilar lymph nodes.  Numerous bilateral lung masses with several small new nodules without cavitation and with interval enlargement of pre-existing nodules.   EKG 85 bpm, normal axis, normal intervals, sinus rhythm, no significant ST segment changes, negative T wave lead III-aVF.   Patient was placed on heparin for anticoagulation. Unable to do biopsy due to respiratory failure. Was placed on systemic corticosteroids along with bronchodilators.   12/12 patient had worsening respiratory failure, transferred to stepdown unit  for noninvasive mechanical ventilation. Patient with metastatic lesions, palliative care was consulted.   CODE STATUS changed to DNR/DNI.   12/16 patient developed atrial fibrillation with RVR, placed on metoprolol with good response.    Pending transfer to SNF.    Assessment & Plan:   Principal Problem:   Acute pulmonary embolus (HCC) Active Problems:   Obstructive sleep apnea   Essential hypertension   CAD, NATIVE VESSEL   Thrombocytopenia (HCC)   Mass of lung   Lesion of liver   Acute on chronic respiratory failure with hypoxia (HCC)   DM type 2 with diabetic mixed hyperlipidemia (HCC)   Depression   Pressure injury of skin   Chest pain   Palliative care by specialist   Acute on chronic hypoxemic respiratory failure, post SARS covid 19 viral pneumonia, acute pulmonary embolism, bilateral pulmonary nodules (worsening). Right lower extremity DVT.  Patient not sable for biopsy, but malignancy in the differential. (Metastatic disease).    Dyspnea continue to improve, his oxygenation is stable on 6 L/min per HFNC Continue to use Bipap for sleep and as needed.    On bronchodilators and inhaled steroids. On anticoagulation with apixaban.  Systemic steroids with prednisone to 20 mg daily for 7 days.    Patient is very weak and deconditioned    2. New onset atrial fibrillation.  Patient converted back to sinus rhythm, with rate control with metoprolol 50 mg bid Anticoagulation with apixaban Continue telemetry monitoring    2. T2DM/ dyslipidemia.  On insulin basal 15 units plus per meal 3 units.   Insulin sliding scale.  Tolerating po well, continue statin therapy   3. OSA/ obesity class 1.  calculated BMI is 32.6  Continue Bipap for sleep and as needed. At home uses Cpap   4. Thrombocytopenia/ anemia of chronic disease.  Stable cell count with hgb at 10,9 and hct at 33.2   5. Hyponatremia.  Renal function stable with serum cr at 0,79, K is 4,0 and serum bicarbonate  at 32.    6. Hepatic cirrhosis with positive 4,7 low density lesion in the caudate lobe.  Possible hemangioma.  Will need outpatient follow up.    7. Stage 2 bilateral buttocks pressure ulcer (present on admission). Continue local wound care.    8. Anxiety.  Tolerating well as needed alprazolam and scheduled  bupropion     Patient continue to be at high risk for worsening respiratory failur e  Status is: Inpatient  Remains inpatient appropriate because: oxymetry monitoring and supplemental 02 per [place]    DVT prophylaxis:  Apixaban   Code Status:    DNR  Family Communication:   I spoke with patient's wife  at the bedside, we talked in detail about patient's condition, plan of care and prognosis and all questions were addressed.       Nutrition Status: Nutrition Problem: Increased nutrient needs Etiology: acute illness, chronic illness Signs/Symptoms: estimated needs Interventions: Ensure Enlive (each supplement provides 350kcal and 20 grams of protein), Prostat, MVI     Skin Documentation: Pressure Injury 07/25/21 Buttocks Right Stage 2 -  Partial thickness loss of dermis presenting as a shallow open injury with a red, pink wound bed without slough. (Active)  07/25/21 1530  Location: Buttocks  Location Orientation: Right  Staging: Stage 2 -  Partial thickness loss of dermis presenting as a shallow open injury with a red, pink wound bed without slough.  Wound Description (Comments):   Present on Admission: Yes     Pressure Injury 07/25/21 Buttocks Left Stage 2 -  Partial thickness loss of dermis presenting as a shallow open injury with a red, pink wound bed without slough. (Active)  07/25/21 1531  Location: Buttocks  Location Orientation: Left  Staging: Stage 2 -  Partial thickness loss of dermis presenting as a shallow open injury with a red, pink wound bed without slough.  Wound Description (Comments):   Present on Admission: Yes     Pressure Injury 08/06/21 Sacrum  Medial Deep Tissue Pressure Injury - Purple or maroon localized area of discolored intact skin or blood-filled blister due to damage of underlying soft tissue from pressure and/or shear. (Active)  08/06/21 1600  Location: Sacrum  Location Orientation: Medial  Staging: Deep Tissue Pressure Injury - Purple or maroon localized area of discolored intact skin or blood-filled blister due to damage of underlying soft tissue from pressure and/or shear.  Wound Description (Comments):   Present on Admission: No     Subjective: Patient is feeling better, dyspnea has been improving, he is tolerating well alprazolam for anxiety   Objective: Vitals:   08/10/21 2300 08/10/21 2329 08/11/21 0349 08/11/21 0400  BP:  (!) 98/53 (!) 109/48   Pulse:  80 79   Resp:  20 (!) 23   Temp: 97.8 F (36.6 C)   98.1 F (36.7 C)  TempSrc: Oral   Axillary  SpO2:  98% 94%   Weight:      Height:        Intake/Output Summary (Last 24 hours) at 08/11/2021 0744 Last data filed at 08/11/2021 0700 Gross per 24 hour  Intake 780 ml  Output 1725 ml  Net -945 ml   [employer]  07/25/21 1500 07/28/21 0414 08/03/21 1625  Weight: 110 kg 110 kg 106.2 kg    Examination:   General: Not in pain or dyspnea, deconditioned  Neurology: Awake and alert, non focal  E ENT: mild pallor, no icterus, oral mucosa moist Cardiovascular: No JVD. S1-S2 present, rhythmic, no gallops, rubs, or murmurs. trace lower extremity edema. Pulmonary: positive breath sounds bilaterally, adequate air movement, no wheezing, rhonchi or rales. Gastrointestinal. Abdomen soft and non tender Skin. No rashes Musculoskeletal: no joint deformities     Data Reviewed: I have personally reviewed following labs and imaging studies  CBC: Recent Labs  Lab 08/05/21 0306 08/06/21 0317 08/07/21 0252 08/08/21 0300 08/11/21 0314  WBC 6.4 7.4 7.2 6.5 6.6  HGB 11.5* 11.5* 11.5* 11.0* 10.9*  HCT 35.8* 35.1* 34.8* 33.3* 33.2*  MCV 95.0 94.1 95.1  94.3 93.5  PLT 89* 96* 91* 89* 87*   Basic Metabolic Panel: Recent Labs  Lab 08/05/21 0306 08/06/21 0317 08/07/21 0252 08/08/21 0300 08/11/21 0314  NA 134* 138 134* 133* 136  K 4.2 5.1 4.3 3.8 4.0  CL 95* 98 97* 99 96*  CO2 32 33* 30 29 32  GLUCOSE 96 118* 151* 115* 108*  BUN 21 20 21 19 22   CREATININE 0.88 0.87 0.88 0.96 0.79  CALCIUM 8.0* 8.3* 8.0* 7.5* 7.9*  MG 2.2 2.2 2.1 2.1  --    GFR: Estimated Creatinine Clearance: 94.4 mL/min (by C-G formula based on SCr of 0.79 mg/dL). Liver Function Tests: No results for input(s): AST, ALT, ALKPHOS, BILITOT, PROT, ALBUMIN in the last 168 hours. No results for input(s): LIPASE, AMYLASE in the last 168 hours. No results for input(s): AMMONIA in the last 168 hours. Coagulation Profile: No results for input(s): INR, PROTIME in the last 168 hours. Cardiac Enzymes: No results for input(s): CKTOTAL, CKMB, CKMBINDEX, TROPONINI in the last 168 hours. BNP (last 3 results) No results for input(s): PROBNP in the last 8760 hours. HbA1C: No results for input(s): HGBA1C in the last 72 hours. CBG: Recent Labs  Lab 08/09/21 2130 08/10/21 0816 08/10/21 1215 08/10/21 1610 08/10/21 2059  GLUCAP 193* 103* 139* 196* 225*   Lipid Profile: No results for input(s): CHOL, HDL, LDLCALC, TRIG, CHOLHDL, LDLDIRECT in the last 72 hours. Thyroid Function Tests: No results for input(s): TSH, T4TOTAL, FREET4, T3FREE, THYROIDAB in the last 72 hours. Anemia Panel: No results for input(s): VITAMINB12, FOLATE, FERRITIN, TIBC, IRON, RETICCTPCT in the last 72 hours.    Radiology Studies: I have reviewed all of the imaging during this hospital visit personally     Scheduled Meds:  (feeding supplement) PROSource Plus  30 mL Oral Daily   apixaban  5 mg Oral BID   budesonide (PULMICORT) nebulizer solution  0.5 mg Nebulization BID   buPROPion  150 mg Oral Daily   chlorhexidine  15 mL Mouth Rinse BID   Chlorhexidine Gluconate Cloth  6 each Topical  Daily   feeding supplement  237 mL Oral Q24H   insulin aspart  0-15 Units Subcutaneous TID WC   insulin aspart  0-5 Units Subcutaneous QHS   insulin aspart  3 Units Subcutaneous TID WC   insulin glargine-yfgn  15 Units Subcutaneous QHS   ipratropium-albuterol  3 mL Inhalation BID   loratadine  10 mg Oral Daily   mouth rinse  15 mL Mouth Rinse q12n4p   metoprolol tartrate  50 mg Oral BID   multivitamin with minerals  1 tablet Oral Daily   oxybutynin  10 mg Oral Daily  pantoprazole  40 mg Oral Daily   predniSONE  20 mg Oral Q breakfast   simvastatin  20 mg Oral q1800   sodium chloride flush  3 mL Intravenous Q12H   tamsulosin  0.4 mg Oral BID   Continuous Infusions:   LOS: 18 days        Kale Rondeau Gerome Apley, MD

## 2021-08-12 LAB — URINALYSIS, ROUTINE W REFLEX MICROSCOPIC
Bilirubin Urine: NEGATIVE
Glucose, UA: NEGATIVE mg/dL
Ketones, ur: 5 mg/dL — AB
Leukocytes,Ua: NEGATIVE
Nitrite: NEGATIVE
Protein, ur: NEGATIVE mg/dL
Specific Gravity, Urine: 1.015 (ref 1.005–1.030)
pH: 8 (ref 5.0–8.0)

## 2021-08-12 LAB — GLUCOSE, CAPILLARY
Glucose-Capillary: 141 mg/dL — ABNORMAL HIGH (ref 70–99)
Glucose-Capillary: 155 mg/dL — ABNORMAL HIGH (ref 70–99)
Glucose-Capillary: 163 mg/dL — ABNORMAL HIGH (ref 70–99)
Glucose-Capillary: 136 mg/dL — ABNORMAL HIGH (ref 70–99)

## 2021-08-12 MED ORDER — FUROSEMIDE 10 MG/ML IJ SOLN
40.0000 mg | Freq: Two times a day (BID) | INTRAMUSCULAR | Status: DC
  Administered 2021-08-12 – 2021-08-13 (×3): 40 mg via INTRAVENOUS
  Filled 2021-08-12 (×5): qty 4

## 2021-08-12 NOTE — Progress Notes (Signed)
PROGRESS NOTE    Jacob Bishop  HER:[id number] DOB: 1941/04/01 DOA: 07/24/2021 PCP: [name], NP    Brief Narrative:  Jacob Bishop was admitted to the hospital with the working diagnosis of acute hypoxemic respiratory failure, in the setting of acute pulmonary embolism.    80 year old male past medical history for coronary artery disease, type 2 diabetes mellitus, and obstructive sleep apnea. He also has lung nodules and liver lesions.  Recent hospitalization 11/5-11/03/2021 for acute hypoxic respiratory failure due to SARS COVID-19 viral pneumonia.  By time of his discharge he was still very weak and deconditioned, but not until a few days prior to his rehospitalization he developed worsening dyspnea that prompted him to come back to the hospital.  On his initial physical examination his oximetry was in the low 90s on 5 L supplemental oxygen per nasal cannula, respiratory rate 26-31, heart rate 103, blood pressure 128/64, his lungs had no wheezing, heart S1-S2, present, rhythmic, abdomen soft nontender, positive right lower extremity edema.   Sodium 135, potassium 3.6, chloride 93, bicarb 32, glucose 143, BUN 12, creatinine 0.89, white count 7.0, hemoglobin 13.2, hematocrit 39.7, platelets 118. SARS COVID-19 negative.   Chest radiograph with hyperinflation.   CT chest with small segmental arterial embolus in the right upper lobe.  Multiple mildly enlarged mediastinal and hilar lymph nodes.  Numerous bilateral lung masses with several small new nodules without cavitation and with interval enlargement of pre-existing nodules.   EKG 85 bpm, normal axis, normal intervals, sinus rhythm, no significant ST segment changes, negative T wave lead III-aVF.   Patient was placed on heparin for anticoagulation. Unable to do biopsy due to respiratory failure. Was placed on systemic corticosteroids along with bronchodilators.   12/12 patient had worsening respiratory failure, transferred to stepdown unit  for noninvasive mechanical ventilation. Patient with metastatic lesions, palliative care was consulted.   CODE STATUS changed to DNR/DNI.   12/16 patient developed atrial fibrillation with RVR, placed on metoprolol with good response.    Pending transfer to SNF.    Assessment & Plan:   Principal Problem:   Acute pulmonary embolus (HCC) Active Problems:   Obstructive sleep apnea   Essential hypertension   CAD, NATIVE VESSEL   Thrombocytopenia (HCC)   Mass of lung   Lesion of liver   Acute on chronic respiratory failure with hypoxia (HCC)   DM type 2 with diabetic mixed hyperlipidemia (HCC)   Depression   Pressure injury of skin   Chest pain   Palliative care by specialist   Acute on chronic hypoxemic respiratory failure, post SARS covid 19 viral pneumonia, acute pulmonary embolism, bilateral pulmonary nodules (worsening). Right lower extremity DVT.  Patient not sable for biopsy, but malignancy in the differential. (Metastatic disease).    Patient continue to be on 6/L per min per HFNC with oxygen saturation 94%.  Bipap for sleep and as needed.   Plan to continue with bronchodilators and inhaled steroids. Anticoagulation with apixaban.  Prednisone to 20 mg daily for 7 days.    Patient is very weak and deconditioned    2. New onset atrial fibrillation/ acute on chronic diastolic heart failure.   Continue rate control with metorplol and anticoagulation with apixaban. Will add 40 mg IV furosemide and follow up response.  Continue oxymetry monitoring    2. T2DM/ dyslipidemia.  Continue with basal insulin 15 units plus per meal 3 units.   On insulin sliding scale.   Statin therapy.    3. OSA/ obesity  class 1. BMI is 32.6 Tolerating well night bipap and as needed. At home he has Cpap.   4. Thrombocytopenia/ anemia of chronic disease.  Cell count has been stable   5. Hyponatremia.  Follow up renal function in am, after diuresis for hypervolemia.    6. Hepatic  cirrhosis with positive 4,7 low density lesion in the caudate lobe.  Possible hemangioma.  Will need outpatient follow up.    7. Stage 2 bilateral buttocks pressure ulcer (present on admission). Continue local wound care.    8. Anxiety.  On PRN alprazolam and scheduled  bupropion   Patient continue to be at high risk for worsening respiratory failure   Status is: Inpatient  Remains inpatient appropriate because: oxymetry monitoring   DVT prophylaxis: Apixaban   Code Status:    DNR   Family Communication:  I spoke with patient's wife at the bedside, we talked in detail about patient's condition, plan of care and prognosis and all questions were addressed.      Nutrition Status: Nutrition Problem: Increased nutrient needs Etiology: acute illness, chronic illness Signs/Symptoms: estimated needs Interventions: Ensure Enlive (each supplement provides 350kcal and 20 grams of protein), Prostat, MVI     Skin Documentation: Pressure Injury 07/25/21 Buttocks Right Stage 2 -  Partial thickness loss of dermis presenting as a shallow open injury with a red, pink wound bed without slough. (Active)  07/25/21 1530  Location: Buttocks  Location Orientation: Right  Staging: Stage 2 -  Partial thickness loss of dermis presenting as a shallow open injury with a red, pink wound bed without slough.  Wound Description (Comments):   Present on Admission: Yes     Pressure Injury 07/25/21 Buttocks Left Stage 2 -  Partial thickness loss of dermis presenting as a shallow open injury with a red, pink wound bed without slough. (Active)  07/25/21 1531  Location: Buttocks  Location Orientation: Left  Staging: Stage 2 -  Partial thickness loss of dermis presenting as a shallow open injury with a red, pink wound bed without slough.  Wound Description (Comments):   Present on Admission: Yes     Pressure Injury 08/06/21 Sacrum Medial Deep Tissue Pressure Injury - Purple or maroon localized area of  discolored intact skin or blood-filled blister due to damage of underlying soft tissue from pressure and/or shear. (Active)  08/06/21 1600  Location: Sacrum  Location Orientation: Medial  Staging: Deep Tissue Pressure Injury - Purple or maroon localized area of discolored intact skin or blood-filled blister due to damage of underlying soft tissue from pressure and/or shear.  Wound Description (Comments):   Present on Admission: No     Subjective: Patient continue to be very weak and deconditioned, no nausea or vomiting, continue to have constipation,   Objective: Vitals:   08/12/21 0539 08/12/21 0744 08/12/21 0813 08/12/21 0849  BP: (!) 106/57 96/63    Pulse: 70 77    Resp: (!) 30 (!) 36 (!) 28   Temp: 98.8 F (37.1 C) 98.1 F (36.7 C)    TempSrc: Oral Oral    SpO2: 92% 94%  94%  Weight:      Height:        Intake/Output Summary (Last 24 hours) at 08/12/2021 1350 Last data filed at 08/12/2021 1300 Gross per 24 hour  Intake 240 ml  Output 767 ml  Net -527 ml   Filed Weights   07/25/21 1500 07/28/21 0414 08/03/21 1625  Weight: 110 kg 110 kg 106.2 kg  Examination:   General: Not in pain or dyspnea, deconditioned  Neurology: Awake and alert, non focal  E ENT: no pallor, no icterus, oral mucosa moist Cardiovascular: No JVD. S1-S2 present, rhythmic, no gallops, rubs, or murmurs. +++ pitting bilateral lower extremity edema. Pulmonary: positive breath sounds bilaterally,  with no wheezing, mild rhonchi and rales. Gastrointestinal. Abdomen soft and non tender Skin. No rashes Musculoskeletal: no joint deformities     Data Reviewed: I have personally reviewed following labs and imaging studies  CBC: Recent Labs  Lab 08/06/21 0317 08/07/21 0252 08/08/21 0300 08/11/21 0314  WBC 7.4 7.2 6.5 6.6  HGB 11.5* 11.5* 11.0* 10.9*  HCT 35.1* 34.8* 33.3* 33.2*  MCV 94.1 95.1 94.3 93.5  PLT 96* 91* 89* 87*   Basic Metabolic Panel: Recent Labs  Lab 08/06/21 0317  08/07/21 0252 08/08/21 0300 08/11/21 0314  NA 138 134* 133* 136  K 5.1 4.3 3.8 4.0  CL 98 97* 99 96*  CO2 33* 30 29 32  GLUCOSE 118* 151* 115* 108*  BUN 20 21 19 22   CREATININE 0.87 0.88 0.96 0.79  CALCIUM 8.3* 8.0* 7.5* 7.9*  MG 2.2 2.1 2.1  --    GFR: Estimated Creatinine Clearance: 94.4 mL/min (by C-G formula based on SCr of 0.79 mg/dL). Liver Function Tests: No results for input(s): AST, ALT, ALKPHOS, BILITOT, PROT, ALBUMIN in the last 168 hours. No results for input(s): LIPASE, AMYLASE in the last 168 hours. No results for input(s): AMMONIA in the last 168 hours. Coagulation Profile: No results for input(s): INR, PROTIME in the last 168 hours. Cardiac Enzymes: No results for input(s): CKTOTAL, CKMB, CKMBINDEX, TROPONINI in the last 168 hours. BNP (last 3 results) No results for input(s): PROBNP in the last 8760 hours. HbA1C: No results for input(s): HGBA1C in the last 72 hours. CBG: Recent Labs  Lab 08/11/21 1147 08/11/21 1615 08/11/21 2218 08/12/21 0842 08/12/21 1128  GLUCAP 137* 202* 92 141* 155*   Lipid Profile: No results for input(s): CHOL, HDL, LDLCALC, TRIG, CHOLHDL, LDLDIRECT in the last 72 hours. Thyroid Function Tests: No results for input(s): TSH, T4TOTAL, FREET4, T3FREE, THYROIDAB in the last 72 hours. Anemia Panel: No results for input(s): VITAMINB12, FOLATE, FERRITIN, TIBC, IRON, RETICCTPCT in the last 72 hours.    Radiology Studies: I have reviewed all of the imaging during this hospital visit personally     Scheduled Meds:  (feeding supplement) PROSource Plus  30 mL Oral Daily   apixaban  5 mg Oral BID   budesonide (PULMICORT) nebulizer solution  0.5 mg Nebulization BID   buPROPion  150 mg Oral Daily   chlorhexidine  15 mL Mouth Rinse BID   Chlorhexidine Gluconate Cloth  6 each Topical Daily   feeding supplement  237 mL Oral Q24H   insulin aspart  0-15 Units Subcutaneous TID WC   insulin aspart  0-5 Units Subcutaneous QHS   insulin  aspart  3 Units Subcutaneous TID WC   insulin glargine-yfgn  15 Units Subcutaneous QHS   ipratropium-albuterol  3 mL Inhalation BID   loratadine  10 mg Oral Daily   mouth rinse  15 mL Mouth Rinse q12n4p   metoprolol tartrate  50 mg Oral BID   multivitamin with minerals  1 tablet Oral Daily   oxybutynin  10 mg Oral Daily   pantoprazole  40 mg Oral Daily   predniSONE  20 mg Oral Q breakfast   simvastatin  20 mg Oral q1800   sodium chloride flush  3 mL  Intravenous Q12H   tamsulosin  0.4 mg Oral BID   Continuous Infusions:   LOS: 19 days        Ilir Mahrt Gerome Apley, MD

## 2021-08-12 NOTE — Progress Notes (Signed)
Pt c/o constipation and BM at base of rectum, pt unable to push through appear to have decrease rectal tone noted. Pt disimpacted large amount of soft to semi formed DRY stool noted. MD updated. SRP, RN

## 2021-08-12 NOTE — Progress Notes (Addendum)
Physical Therapy Treatment Patient Details Name: Jacob Bishop MRN: [id number] DOB: 1941/06/24 Today's Date: 08/12/2021   History of Present Illness 80 yo male admitted with acute PE, LE DVT, Afib with RVR. Hx of COVID, COPD-O2 dep at baseline    PT Comments    Pt continues to require +2 assist. He c/o painful buttocks on today. Encouraged pt to try to spend some time positioned in semi-sidelying to take some pressure off of his buttocks. With encouragement, he agreed to sit EOB to work on balance and to perform some LE exercises.     Recommendations for follow up therapy are one component of a multi-disciplinary discharge planning process, led by the attending physician.  Recommendations may be updated based on patient status, additional functional criteria and insurance authorization.  Follow Up Recommendations  Skilled nursing-short term rehab (<3 hours/day)     Assistance Recommended at Discharge Frequent or constant Supervision/Assistance  Equipment Recommendations  None recommended by PT    Recommendations for Other Services       Precautions / Restrictions Precautions Precautions: Fall Precaution Comments: "Bad" right knee Restrictions Weight Bearing Restrictions: No     Mobility  Bed Mobility Overal bed mobility: Needs Assistance Bed Mobility: Supine to Sit;Rolling;Sit to Supine Rolling: Mod assist   Supine to sit: Max assist;+2 for physical assistance;+2 for safety/equipment;HOB elevated Sit to supine: Max assist;+2 for physical assistance;+2 for safety/equipment;HOB elevated   General bed mobility comments: Assist for trunk and bil LEs. Utilized bedpad for scooting, positioning. Increaesd time. Cues provided. Sat EOB and worked on Research scientist (life sciences) transfer comment: NT on today-pt felt too fatigued to attempt    Ambulation/Gait                   Stairs             Wheelchair  Mobility    Modified Rankin (Stroke Patients Only)       Balance Overall balance assessment: Needs assistance Sitting-balance support: No upper extremity supported Sitting balance-Leahy Scale: Fair Sitting balance - Comments: Worked on static sitting balance with UE support and dynamic reaching   Standing balance support: Bilateral upper extremity supported Standing balance-Leahy Scale: Poor                              Cognition Arousal/Alertness: Awake/alert Behavior During Therapy: WFL for tasks assessed/performed Overall Cognitive Status: Within Functional Limits for tasks assessed                                          Exercises General Exercises - Lower Extremity Long Arc Quad: AROM;Both;10 reps Hip Flexion/Marching: AROM;Both;10 reps    General Comments        Pertinent Vitals/Pain Pain Assessment: Faces Faces Pain Scale: Hurts even more Pain Location: buttocks Pain Descriptors / Indicators: Discomfort;Sore;Grimacing Pain Intervention(s): Limited activity within patient's tolerance;Monitored during session;Repositioned    Home Living                          Prior Function            PT Goals (current goals can now be found in the care  plan section) Progress towards PT goals: Progressing toward goals    Frequency    Min 2X/week      PT Plan Current plan remains appropriate    Co-evaluation              AM-PAC PT "6 Clicks" Mobility   Outcome Measure  Help needed turning from your back to your side while in a flat bed without using bedrails?: A Lot Help needed moving from lying on your back to sitting on the side of a flat bed without using bedrails?: A Lot Help needed moving to and from a bed to a chair (including a wheelchair)?: Total Help needed standing up from a chair using your arms (e.g., wheelchair or bedside chair)?: Total Help needed to walk in hospital room?: Total Help needed  climbing 3-5 steps with a railing? : Total 6 Click Score: 8    End of Session   Activity Tolerance: Patient limited by fatigue Patient left: in bed;with call bell/phone within reach;with bed alarm set   PT Visit Diagnosis: Muscle weakness (generalized) (M62.81);Difficulty in walking, not elsewhere classified (R26.2)     Time: 7159-5396 PT Time Calculation (min) (ACUTE ONLY): 19 min  Charges:  $therapeutic exercise: 8-22 mins                         Doreatha Massed, PT Acute Rehabilitation  Office: [phone] Pager: [phone]

## 2021-08-13 LAB — GLUCOSE, CAPILLARY
Glucose-Capillary: 113 mg/dL — ABNORMAL HIGH (ref 70–99)
Glucose-Capillary: 227 mg/dL — ABNORMAL HIGH (ref 70–99)
Glucose-Capillary: 141 mg/dL — ABNORMAL HIGH (ref 70–99)
Glucose-Capillary: 163 mg/dL — ABNORMAL HIGH (ref 70–99)

## 2021-08-13 LAB — BASIC METABOLIC PANEL
Anion gap: 9 (ref 5–15)
BUN: 25 mg/dL — ABNORMAL HIGH (ref 8–23)
CO2: 32 mmol/L (ref 22–32)
Calcium: 8.3 mg/dL — ABNORMAL LOW (ref 8.9–10.3)
Chloride: 97 mmol/L — ABNORMAL LOW (ref 98–111)
Creatinine, Ser: 0.84 mg/dL (ref 0.61–1.24)
GFR, Estimated: >60 mL/min (ref >60)
Glucose, Bld: 112 mg/dL — ABNORMAL HIGH (ref 70–99)
Potassium: 4.4 mmol/L (ref 3.5–5.1)
Sodium: 138 mmol/L (ref 135–145)

## 2021-08-13 MED ORDER — CHLORHEXIDINE GLUCONATE CLOTH 2 % EX PADS
6.0000 | MEDICATED_PAD | Freq: Every day | CUTANEOUS | Status: DC
  Administered 2021-08-13 – 2021-08-25 (×13): 6 via TOPICAL

## 2021-08-13 NOTE — NC FL2 (Signed)
[place] LEVEL OF CARE SCREENING TOOL     IDENTIFICATION  Patient Name: Jacob Bishop Birthdate: 02/26/1941 Sex: male Admission Date (Current Location): 07/24/2021  [facility] and [place] Number:  Herbalist and Address:  [facility],  [place], [place]      Provider Number: [id number]  Attending Physician Name and Address:  Tawni Millers,*  Relative Name and Phone Number:  Blong, Busk [phone]    Current Level of Care: Hospital Recommended Level of Care: [place] Prior Approval Number:    Date Approved/Denied:   PASRR Number: [id number]  Discharge Plan: SNF    Current Diagnoses: Patient Active Problem List   Diagnosis Date Noted   Palliative care by specialist    Chest pain 07/27/2021   Depression 07/25/2021   Pressure injury of skin 07/25/2021   Acute pulmonary embolus ([place]) 07/24/2021   Acute on chronic respiratory failure with hypoxia ([place]) 06/30/2021   DM type 2 with diabetic mixed hyperlipidemia ([place]) 06/30/2021   Mass of lung 06/27/2021   Lesion of liver 06/27/2021   Chronic respiratory failure with hypoxia ([place]) 06/27/2021   OA (osteoarthritis) of knee 10/04/2018   Other secondary pulmonary hypertension ([place]) 04/06/2018   Osteoarthritis, hand 05/05/2014   Carpal tunnel syndrome 05/02/2014   Eczema 01/23/2014   Thrombocytopenia ([place]) 11/19/2012   Overactive bladder 08/24/2012   Osteoarthritis of right knee 01/22/2012   Benign positional vertigo 07/09/2011   CAD, NATIVE VESSEL 10/22/2009   EDEMA 08/03/2009   Obstructive sleep apnea 05/03/2009   SKIN LESION 09/23/2007   Diabetes mellitus type 2, uncontrolled 03/23/2007   Hyperlipidemia 03/23/2007   Essential hypertension 03/23/2007   COLONIC POLYPS, HX OF 03/23/2007   Morbid obesity ([place]) 03/23/2007    Orientation RESPIRATION BLADDER Height & Weight     Self, Time, Situation, Place  O2 (6L O2 [place])  Incontinent, Indwelling catheter Weight: 106.2 kg Height:  5\' 11"  (180.3 cm)  BEHAVIORAL SYMPTOMS/MOOD NEUROLOGICAL BOWEL NUTRITION STATUS      Incontinent Diet  AMBULATORY STATUS COMMUNICATION OF NEEDS Skin   Extensive Assist Verbally Other (Comment), PU Stage and Appropriate Care (Buttocks Left and right Stage 2, Dressing changed prn)                       Personal Care Assistance Level of Assistance  Bathing, Feeding, Dressing Bathing Assistance: Maximum assistance Feeding assistance: Limited assistance Dressing Assistance: Maximum assistance     Functional Limitations Info  Sight, Hearing, Speech Sight Info: Adequate Hearing Info: Adequate Speech Info: Adequate    SPECIAL CARE FACTORS FREQUENCY  PT (By licensed PT), OT (By licensed OT)     PT Frequency: x5 week OT Frequency: x5 week            Contractures Contractures Info: Not present    Additional Factors Info  Allergies, Code Status Code Status Info: DNR Allergies Info: Lipitor (Atorvastatin Calcium), Oxycodone           Current Medications (08/13/2021):  This is the current hospital active medication list Current Facility-Administered Medications  Medication Dose Route Frequency Provider Last Rate Last Admin   (feeding supplement) PROSource Plus liquid 30 mL  30 mL Oral Daily Elodia Florence., MD   30 mL at 08/13/21 1002   acetaminophen (TYLENOL) tablet 650 mg  650 mg Oral Q6H PRN Vianne Bulls, MD   650 mg at 08/11/21 1522   Or   acetaminophen (TYLENOL)  suppository 650 mg  650 mg Rectal Q6H PRN Opyd, Ilene Qua, MD       albuterol (PROVENTIL) (2.5 MG/3ML) 0.083% nebulizer solution 2.5 mg  2.5 mg Nebulization Q4H PRN Arrien, Lottie Picket, MD       ALPRAZolam Duanne Moron) tablet 1 mg  1 mg Oral TID PRN Arrien, Nigil Picket, MD   1 mg at 08/12/21 2220   alum & mag hydroxide-simeth (MAALOX/MYLANTA) 200-200-20 MG/5ML suspension 30 mL  30 mL Oral Q4H PRN Opyd, Ilene Qua, MD   30 mL at 07/28/21 0904    apixaban (ELIQUIS) tablet 5 mg  5 mg Oral BID Suzzanne Cloud, RPH   5 mg at 08/13/21 1001   budesonide (PULMICORT) nebulizer solution 0.5 mg  0.5 mg Nebulization BID Amin, Ankit Chirag, MD   0.5 mg at 08/13/21 0240   buPROPion (WELLBUTRIN XL) 24 hr tablet 150 mg  150 mg Oral Daily Opyd, Ilene Qua, MD   150 mg at 08/13/21 1002   calcium carbonate (TUMS - dosed in mg elemental calcium) chewable tablet 200 mg of elemental calcium  1 tablet Oral Daily PRN Elodia Florence., MD   200 mg of elemental calcium at 07/27/21 1337   chlorhexidine (PERIDEX) 0.12 % solution 15 mL  15 mL Mouth Rinse BID Amin, Ankit Chirag, MD   15 mL at 08/13/21 1002   Chlorhexidine Gluconate Cloth 2 % PADS 6 each  6 each Topical Daily Tawni Millers, MD   6 each at 08/13/21 1332   feeding supplement (ENSURE ENLIVE / ENSURE PLUS) liquid 237 mL  237 mL Oral Q24H Elodia Florence., MD   237 mL at 08/12/21 2221   furosemide (LASIX) injection 40 mg  40 mg Intravenous BID Tawni Millers, MD   40 mg at 08/13/21 9735   guaiFENesin-dextromethorphan (ROBITUSSIN DM) 100-10 MG/5ML syrup 5 mL  5 mL Oral Q4H PRN Elodia Florence., MD   5 mL at 07/29/21 2043   insulin aspart (novoLOG) injection 0-15 Units  0-15 Units Subcutaneous TID WC Opyd, Ilene Qua, MD   5 Units at 08/13/21 1331   insulin aspart (novoLOG) injection 0-5 Units  0-5 Units Subcutaneous QHS Vianne Bulls, MD   2 Units at 08/10/21 2251   insulin aspart (novoLOG) injection 3 Units  3 Units Subcutaneous TID WC Opyd, Ilene Qua, MD   3 Units at 08/13/21 1332   insulin glargine-yfgn (SEMGLEE) injection 15 Units  15 Units Subcutaneous QHS Amin, Ankit Chirag, MD   15 Units at 08/12/21 2221   ipratropium-albuterol (DUONEB) 0.5-2.5 (3) MG/3ML nebulizer solution 3 mL  3 mL Inhalation BID Kathie Dike, MD   3 mL at 08/13/21 3299   lip balm (CARMEX) ointment   Topical PRN Elodia Florence., MD       loratadine (CLARITIN) tablet 10 mg  10 mg Oral  Daily Amin, Ankit Chirag, MD   10 mg at 08/13/21 1002   MEDLINE mouth rinse  15 mL Mouth Rinse q12n4p Amin, Ankit Chirag, MD   15 mL at 08/12/21 1936   metoprolol tartrate (LOPRESSOR) tablet 50 mg  50 mg Oral BID Lovey Newcomer T, NP   50 mg at 08/13/21 1001   multivitamin with minerals tablet 1 tablet  1 tablet Oral Daily Elodia Florence., MD   1 tablet at 08/13/21 1002   oxybutynin (DITROPAN-XL) 24 hr tablet 10 mg  10 mg Oral Daily Opyd, Ilene Qua, MD   10 mg at  08/13/21 1002   pantoprazole (PROTONIX) EC tablet 40 mg  40 mg Oral Daily Elodia Florence., MD   40 mg at 08/13/21 1002   polyethylene glycol (MIRALAX / GLYCOLAX) packet 17 g  17 g Oral Daily PRN Opyd, Ilene Qua, MD   17 g at 08/13/21 0831   predniSONE (DELTASONE) tablet 20 mg  20 mg Oral Q breakfast Arrien, Herny Picket, MD   20 mg at 08/13/21 5093   senna-docusate (Senokot-S) tablet 2 tablet  2 tablet Oral BID PRN Damita Lack, MD   2 tablet at 08/12/21 2220   simvastatin (ZOCOR) tablet 20 mg  20 mg Oral q1800 Opyd, Ilene Qua, MD   20 mg at 08/12/21 1934   sodium chloride (OCEAN) 0.65 % nasal spray 1 spray  1 spray Each Nare PRN Elodia Florence., MD   1 spray at 08/01/21 1046   sodium chloride flush (NS) 0.9 % injection 3 mL  3 mL Intravenous Q12H Opyd, Ilene Qua, MD   3 mL at 08/13/21 1003   tamsulosin (FLOMAX) capsule 0.4 mg  0.4 mg Oral BID Vianne Bulls, MD   0.4 mg at 08/13/21 2671     Discharge Medications: Please see discharge summary for a list of discharge medications.  Relevant Imaging Results:  Relevant Lab Results:   Additional Information [phone]  Purcell Mouton, RN

## 2021-08-13 NOTE — Progress Notes (Signed)
Patient called RN stating he could not void, patient had passed some urine through primo fit but still felt like he was not emptying.  Bladder scan performed, read as greater than 800.  MD notified, order to place foley for retention obtained.   16 french foley placed per order without difficulty.

## 2021-08-13 NOTE — TOC Progression Note (Signed)
Transition of Care [facility]) - Progression Note    Patient Details  Name: Jacob Bishop MRN: [id number] Date of Birth: 1941/07/09  [facility] CM/SW Contact  Purcell Mouton, RN Phone Number: 08/13/2021, 3:48 PM  Clinical Narrative:    Spoke with pt who agreed with going to SNF. Pt asked that CM call his wife. A call was made with no answer. Pt was fax to SNF.    Expected Discharge Plan: [place] Barriers to Discharge: No Barriers Identified  Expected Discharge Plan and Services Expected Discharge Plan: [place] arrangements for the past 2 months: Single Family Home                                       Social Determinants of Health (SDOH) Interventions    Readmission Risk Interventions No flowsheet data found.

## 2021-08-13 NOTE — H&P (Incomplete)
RR 30-50, c/o anxiety. On bipap. Has not had PRN xanax today. Xanax given per order. RT, Morey Hummingbird and J.Olena Heckle, NP at bedside to assess. [place] due to RR rate. Will recheck VS when he calms down to asssess MEWS again. Continuing to monitor.

## 2021-08-13 NOTE — Care Management Important Message (Signed)
Important Message  Patient Details IM Letter placed in Patients room. Name: Jacob Bishop MRN: [id number] Date of Birth: Sep 27, 1940   Medicare Important Message Given:  Yes     Kerin Salen 08/13/2021, 11:13 AM

## 2021-08-13 NOTE — Progress Notes (Signed)
PROGRESS NOTE    Jacob Bishop  TDV:[id number] DOB: 1941/01/02 DOA: 07/24/2021 PCP: [name], NP    Brief Narrative:  Jacob Bishop was admitted to the hospital with the working diagnosis of acute hypoxemic respiratory failure, in the setting of acute pulmonary embolism.    80 year old male past medical history for coronary artery disease, type 2 diabetes mellitus, and obstructive sleep apnea. He also has lung nodules and liver lesions.  Recent hospitalization 11/5-11/03/2021 for acute hypoxic respiratory failure due to SARS COVID-19 viral pneumonia.  By time of his discharge he was still very weak and deconditioned, but not until a few days prior to his rehospitalization he developed worsening dyspnea that prompted him to come back to the hospital.  On his initial physical examination his oximetry was in the low 90s on 5 L supplemental oxygen per nasal cannula, respiratory rate 26-31, heart rate 103, blood pressure 128/64, his lungs had no wheezing, heart S1-S2, present, rhythmic, abdomen soft nontender, positive right lower extremity edema.   Sodium 135, potassium 3.6, chloride 93, bicarb 32, glucose 143, BUN 12, creatinine 0.89, white count 7.0, hemoglobin 13.2, hematocrit 39.7, platelets 118. SARS COVID-19 negative.   Chest radiograph with hyperinflation.   CT chest with small segmental arterial embolus in the right upper lobe.  Multiple mildly enlarged mediastinal and hilar lymph nodes.  Numerous bilateral lung masses with several small new nodules without cavitation and with interval enlargement of pre-existing nodules.   EKG 85 bpm, normal axis, normal intervals, sinus rhythm, no significant ST segment changes, negative T wave lead III-aVF.   Patient was placed on heparin for anticoagulation. Unable to do biopsy due to respiratory failure. Was placed on systemic corticosteroids along with bronchodilators.   12/12 patient had worsening respiratory failure, transferred to stepdown unit  for noninvasive mechanical ventilation. Patient with metastatic lesions, palliative care was consulted.   CODE STATUS changed to DNR/DNI.   12/16 patient developed atrial fibrillation with RVR, placed on metoprolol with good response.    Pending transfer to SNF.      Assessment & Plan:   Principal Problem:   Acute pulmonary embolus (HCC) Active Problems:   Obstructive sleep apnea   Essential hypertension   CAD, NATIVE VESSEL   Thrombocytopenia (HCC)   Mass of lung   Lesion of liver   Acute on chronic respiratory failure with hypoxia (HCC)   DM type 2 with diabetic mixed hyperlipidemia (HCC)   Depression   Pressure injury of skin   Chest pain   Palliative care by specialist   Acute on chronic hypoxemic respiratory failure, post SARS covid 19 viral pneumonia, acute pulmonary embolism, bilateral pulmonary nodules (worsening). Right lower extremity DVT.  Patient not sable for biopsy, but malignancy in the differential. (Metastatic disease).    Dyspnea continue to improve, continue on 6 L per min per nasal cannula high flow.    On bronchodilators and inhaled steroids. Continue with apixaban for Anticoagulation  Prednisone to 20 mg daily for 7 days.    Patient is very weak and deconditioned    2. New onset atrial fibrillation/ acute on chronic diastolic heart failure.   Continue rate control with carvedilol and anticoagulation with apixaban. Tolerating well diuresis with furosemide, urine output 1.700 ml over last 24 hrs,. Continue diuresis and follow up on volume status.    2. T2DM/ dyslipidemia.  On basal insulin 15 units plus per meal 3 units.   On insulin sliding scale.  Tolerating po well.  Statin therapy.  3. OSA/ obesity class 1. BMI is 32.6 BIPap for sleeping,    4. Thrombocytopenia/ anemia of chronic disease.  Cell count has been stable   5. Hyponatremia.  Renal function with serum cr at 0,84, K is 4,4 and serum bicarbonate at 32, Continue diuresis  with furosemide and follow up renal function in am.  NA is 138 today    6. Hepatic cirrhosis with positive 4,7 low density lesion in the caudate lobe.  Possible hemangioma.  Will need outpatient follow up.    7. Stage 2 bilateral buttocks pressure ulcer (present on admission). Continue local wound care.    8. Anxiety.  Continue with PRN alprazolam and scheduled  bupropion    Status is: Inpatient  Remains inpatient appropriate because: pending transfer to SNF        DVT prophylaxis:  Apixaban   Code Status:    Dnr   Family Communication:   No family at the bedside      Nutrition Status: Nutrition Problem: Increased nutrient needs Etiology: acute illness, chronic illness Signs/Symptoms: estimated needs Interventions: Ensure Enlive (each supplement provides 350kcal and 20 grams of protein), Prostat, MVI     Skin Documentation: Pressure Injury 07/25/21 Buttocks Right Stage 2 -  Partial thickness loss of dermis presenting as a shallow open injury with a red, pink wound bed without slough. (Active)  07/25/21 1530  Location: Buttocks  Location Orientation: Right  Staging: Stage 2 -  Partial thickness loss of dermis presenting as a shallow open injury with a red, pink wound bed without slough.  Wound Description (Comments):   Present on Admission: Yes     Pressure Injury 07/25/21 Buttocks Left Stage 2 -  Partial thickness loss of dermis presenting as a shallow open injury with a red, pink wound bed without slough. (Active)  07/25/21 1531  Location: Buttocks  Location Orientation: Left  Staging: Stage 2 -  Partial thickness loss of dermis presenting as a shallow open injury with a red, pink wound bed without slough.  Wound Description (Comments):   Present on Admission: Yes     Pressure Injury 08/06/21 Sacrum Medial Deep Tissue Pressure Injury - Purple or maroon localized area of discolored intact skin or blood-filled blister due to damage of underlying soft tissue from  pressure and/or shear. (Active)  08/06/21 1600  Location: Sacrum  Location Orientation: Medial  Staging: Deep Tissue Pressure Injury - Purple or maroon localized area of discolored intact skin or blood-filled blister due to damage of underlying soft tissue from pressure and/or shear.  Wound Description (Comments):   Present on Admission: No     Subjective: Patient is feeling better, no nausea or vomiting and dyspnea continue to improve, he had a large bowel movement yesterday   Objective: Vitals:   08/13/21 0451 08/13/21 0813 08/13/21 0904 08/13/21 1001  BP: (!) 103/55     Pulse: 73   80  Resp: 20     Temp: 98.3 F (36.8 C)     TempSrc: Oral     SpO2: 94% 93% 93%   Weight:      Height:        Intake/Output Summary (Last 24 hours) at 08/13/2021 1312 Last data filed at 08/13/2021 0900 Gross per 24 hour  Intake 480 ml  Output 1700 ml  Net -1220 ml   Filed Weights   07/25/21 1500 07/28/21 0414 08/03/21 1625  Weight: 110 kg 110 kg 106.2 kg    Examination:   General: Not in pain or  dyspnea, deconditioned  Neurology: Awake and alert, non focal  E ENT: mild pallor, no icterus, oral mucosa moist Cardiovascular: No JVD. S1-S2 present, rhythmic, no gallops, rubs, or murmurs. ++ pitting bilateral lower extremity edema. Pulmonary:  positive breath sounds bilaterally, with no wheezing, rhonchi or rales. Gastrointestinal. Abdomen soft and non tender Skin. No rashes Musculoskeletal: no joint deformities     Data Reviewed: I have personally reviewed following labs and imaging studies  CBC: Recent Labs  Lab 08/07/21 0252 08/08/21 0300 08/11/21 0314  WBC 7.2 6.5 6.6  HGB 11.5* 11.0* 10.9*  HCT 34.8* 33.3* 33.2*  MCV 95.1 94.3 93.5  PLT 91* 89* 87*   Basic Metabolic Panel: Recent Labs  Lab 08/07/21 0252 08/08/21 0300 08/11/21 0314 08/13/21 0418  NA 134* 133* 136 138  K 4.3 3.8 4.0 4.4  CL 97* 99 96* 97*  CO2 30 29 32 32  GLUCOSE 151* 115* 108* 112*  BUN 21  19 22  25*  CREATININE 0.88 0.96 0.79 0.84  CALCIUM 8.0* 7.5* 7.9* 8.3*  MG 2.1 2.1  --   --    GFR: Estimated Creatinine Clearance: 89.9 mL/min (by C-G formula based on SCr of 0.84 mg/dL). Liver Function Tests: No results for input(s): AST, ALT, ALKPHOS, BILITOT, PROT, ALBUMIN in the last 168 hours. No results for input(s): LIPASE, AMYLASE in the last 168 hours. No results for input(s): AMMONIA in the last 168 hours. Coagulation Profile: No results for input(s): INR, PROTIME in the last 168 hours. Cardiac Enzymes: No results for input(s): CKTOTAL, CKMB, CKMBINDEX, TROPONINI in the last 168 hours. BNP (last 3 results) No results for input(s): PROBNP in the last 8760 hours. HbA1C: No results for input(s): HGBA1C in the last 72 hours. CBG: Recent Labs  Lab 08/12/21 0842 08/12/21 1128 08/12/21 1633 08/12/21 2035 08/13/21 1148  GLUCAP 141* 155* 163* 136* 227*   Lipid Profile: No results for input(s): CHOL, HDL, LDLCALC, TRIG, CHOLHDL, LDLDIRECT in the last 72 hours. Thyroid Function Tests: No results for input(s): TSH, T4TOTAL, FREET4, T3FREE, THYROIDAB in the last 72 hours. Anemia Panel: No results for input(s): VITAMINB12, FOLATE, FERRITIN, TIBC, IRON, RETICCTPCT in the last 72 hours.    Radiology Studies: I have reviewed all of the imaging during this hospital visit personally     Scheduled Meds:  (feeding supplement) PROSource Plus  30 mL Oral Daily   apixaban  5 mg Oral BID   budesonide (PULMICORT) nebulizer solution  0.5 mg Nebulization BID   buPROPion  150 mg Oral Daily   chlorhexidine  15 mL Mouth Rinse BID   feeding supplement  237 mL Oral Q24H   furosemide  40 mg Intravenous BID   insulin aspart  0-15 Units Subcutaneous TID WC   insulin aspart  0-5 Units Subcutaneous QHS   insulin aspart  3 Units Subcutaneous TID WC   insulin glargine-yfgn  15 Units Subcutaneous QHS   ipratropium-albuterol  3 mL Inhalation BID   loratadine  10 mg Oral Daily   mouth rinse   15 mL Mouth Rinse q12n4p   metoprolol tartrate  50 mg Oral BID   multivitamin with minerals  1 tablet Oral Daily   oxybutynin  10 mg Oral Daily   pantoprazole  40 mg Oral Daily   predniSONE  20 mg Oral Q breakfast   simvastatin  20 mg Oral q1800   sodium chloride flush  3 mL Intravenous Q12H   tamsulosin  0.4 mg Oral BID   Continuous Infusions:  LOS: 20 days        Skilar Marcou Gerome Apley, MD

## 2021-08-13 NOTE — Progress Notes (Signed)
R 30-50, c/o anxiety. On bipap. Has not had PRN xanax today. Xanax given per order. RT, Morey Hummingbird and J.Olena Heckle, NP at bedside to assess. Red Mews due to RR rate. Will recheck VS when he calms down to asssess MEWS again. Continuing to monitor.

## 2021-08-14 ENCOUNTER — Inpatient Hospital Stay (HOSPITAL_COMMUNITY): Payer: Medicare Other

## 2021-08-14 LAB — URINE CULTURE: Culture: >=100000 — AB

## 2021-08-14 LAB — BASIC METABOLIC PANEL
Anion gap: 7 (ref 5–15)
BUN: 32 mg/dL — ABNORMAL HIGH (ref 8–23)
CO2: 36 mmol/L — ABNORMAL HIGH (ref 22–32)
Calcium: 8 mg/dL — ABNORMAL LOW (ref 8.9–10.3)
Chloride: 93 mmol/L — ABNORMAL LOW (ref 98–111)
Creatinine, Ser: 0.92 mg/dL (ref 0.61–1.24)
GFR, Estimated: >60 mL/min (ref >60)
Glucose, Bld: 110 mg/dL — ABNORMAL HIGH (ref 70–99)
Potassium: 3.7 mmol/L (ref 3.5–5.1)
Sodium: 136 mmol/L (ref 135–145)

## 2021-08-14 LAB — GLUCOSE, CAPILLARY
Glucose-Capillary: 125 mg/dL — ABNORMAL HIGH (ref 70–99)
Glucose-Capillary: 153 mg/dL — ABNORMAL HIGH (ref 70–99)
Glucose-Capillary: 198 mg/dL — ABNORMAL HIGH (ref 70–99)
Glucose-Capillary: 167 mg/dL — ABNORMAL HIGH (ref 70–99)

## 2021-08-14 NOTE — Progress Notes (Signed)
PROGRESS NOTE    Jacob Bishop  TGG:[id number] DOB: [birth year] DOA: 07/24/2021 PCP: [name], NP    Brief Narrative:  Mr. Garrelts was admitted to the hospital with the working diagnosis of acute hypoxemic respiratory failure, in the setting of acute pulmonary embolism.    80 year old male past medical history for coronary artery disease, type 2 diabetes mellitus, and obstructive sleep apnea. He also has lung nodules and liver lesions.  Recent hospitalization 11/5-11/03/2021 for acute hypoxic respiratory failure due to SARS COVID-19 viral pneumonia.  By time of his discharge he was still very weak and deconditioned, but not until a few days prior to his rehospitalization he developed worsening dyspnea that prompted him to come back to the hospital.  On his initial physical examination his oximetry was in the low 90s on 5 L supplemental oxygen per nasal cannula, respiratory rate 26-31, heart rate 103, blood pressure 128/64, his lungs had no wheezing, heart S1-S2, present, rhythmic, abdomen soft nontender, positive right lower extremity edema.   Sodium 135, potassium 3.6, chloride 93, bicarb 32, glucose 143, BUN 12, creatinine 0.89, white count 7.0, hemoglobin 13.2, hematocrit 39.7, platelets 118. SARS COVID-19 negative.   Chest radiograph with hyperinflation.   CT chest with small segmental arterial embolus in the right upper lobe.  Multiple mildly enlarged mediastinal and hilar lymph nodes.  Numerous bilateral lung masses with several small new nodules without cavitation and with interval enlargement of pre-existing nodules.   EKG 85 bpm, normal axis, normal intervals, sinus rhythm, no significant ST segment changes, negative T wave lead III-aVF.   Patient was placed on heparin for anticoagulation. Unable to do biopsy due to respiratory failure. Was placed on systemic corticosteroids along with bronchodilators.   12/12 patient had worsening respiratory failure, transferred to stepdown unit  for noninvasive mechanical ventilation. Patient with metastatic lesions, palliative care was consulted.   CODE STATUS changed to DNR/DNI.   12/16 patient developed atrial fibrillation with RVR, placed on metoprolol with good response.    Pending transfer to SNF.    Assessment & Plan:   Principal Problem:   Acute pulmonary embolus (HCC) Active Problems:   Obstructive sleep apnea   Essential hypertension   CAD, NATIVE VESSEL   Thrombocytopenia (HCC)   Mass of lung   Lesion of liver   Acute on chronic respiratory failure with hypoxia (HCC)   DM type 2 with diabetic mixed hyperlipidemia (HCC)   Depression   Pressure injury of skin   Chest pain   Palliative care by specialist   Acute on chronic hypoxemic respiratory failure, post SARS covid 19 viral pneumonia, acute pulmonary embolism, bilateral pulmonary nodules (worsening). Right lower extremity DVT.  Patient not sable for biopsy, but malignancy in the differential. (Metastatic disease).    Positive rapid shallow breathing, he reports dyspnea being stable, continue to have anxiety that improved with alprazolam.  Using intermittent bipap for dyspnea.     Continue medical therapy with systemic steroids, slow taper. Bronchodilator therapy and anticoagulation with apixaban.  Will check chest film today for follow up.  I have explained her wife at the bedside that there is a very high suspicion for malignancy but can not be confirmed with no biopsy diagnosis.    2. New onset atrial fibrillation/ acute on chronic diastolic heart failure.   Patient continue with sinus rhythm, continue anticoagulation with apixaban. Rate control with metoprolol His volume status has improved, urine output documented is 2,450 ml over last 24 hrs with a net fluid  balance of negative 9,435 since admission.  Continue telemetry monitoring      2. T2DM/ dyslipidemia.  Continue glucose cover with  basal insulin 15 units plus per meal 3 units.   On  insulin sliding scale.  This am fasting glucose was 110.    3. OSA/ obesity class 1. BMI is 32.6 BIPap for sleeping and as needed during the day    4. Thrombocytopenia/ anemia of chronic disease.  Follow cell count as needed.    5. Hyponatremia.  Renal function continue to be stable, tolerated well diuresis with furosemide. Na is 136, K is 3,7 and serum bicarbonate at 36.  Cr is 0,92   6. Hepatic cirrhosis with positive 4,7 low density lesion in the caudate lobe.  Possible hemangioma.  Will need outpatient follow up.    7. Stage 2 bilateral buttocks pressure ulcer (present on admission). Continue local wound care.    8. Anxiety.  On PRN alprazolam and scheduled  bupropion    Patient continue to be at high risk for worsening respiratory failure   Status is: Inpatient  Remains inpatient appropriate because: oxymetry and respiratory support    DVT prophylaxis:  Apixaban   Code Status:    DNR   Family Communication:   I spoke with patient's wife at the bedside, we talked in detail about patient's condition, plan of care and prognosis and all questions were addressed.   Nutrition Status: Nutrition Problem: Increased nutrient needs Etiology: acute illness, chronic illness Signs/Symptoms: estimated needs Interventions: Ensure Enlive (each supplement provides 350kcal and 20 grams of protein), Prostat, MVI     Skin Documentation: Pressure Injury 07/25/21 Buttocks Right Stage 2 -  Partial thickness loss of dermis presenting as a shallow open injury with a red, pink wound bed without slough. (Active)  07/25/21 1530  Location: Buttocks  Location Orientation: Right  Staging: Stage 2 -  Partial thickness loss of dermis presenting as a shallow open injury with a red, pink wound bed without slough.  Wound Description (Comments):   Present on Admission: Yes     Pressure Injury 07/25/21 Buttocks Left Stage 2 -  Partial thickness loss of dermis presenting as a shallow open injury  with a red, pink wound bed without slough. (Active)  07/25/21 1531  Location: Buttocks  Location Orientation: Left  Staging: Stage 2 -  Partial thickness loss of dermis presenting as a shallow open injury with a red, pink wound bed without slough.  Wound Description (Comments):   Present on Admission: Yes     Pressure Injury 08/06/21 Sacrum Medial Deep Tissue Pressure Injury - Purple or maroon localized area of discolored intact skin or blood-filled blister due to damage of underlying soft tissue from pressure and/or shear. (Active)  08/06/21 1600  Location: Sacrum  Location Orientation: Medial  Staging: Deep Tissue Pressure Injury - Purple or maroon localized area of discolored intact skin or blood-filled blister due to damage of underlying soft tissue from pressure and/or shear.  Wound Description (Comments):   Present on Admission: No     Consultants:  Critical care Palliative care     Subjective: Patient continue to have rapid shallow breathing, no cough or chest pain, continue to have anxiety that improves with alprazolam.   Objective: Vitals:   08/14/21 0452 08/14/21 0900 08/14/21 0907 08/14/21 1110  BP: (!) 102/52 (!) 94/46  (!) 101/58  Pulse: 74 90  97  Resp: (!) 22   (!) 38  Temp: 99 F (37.2 C)  97.9 F (36.6 C)  TempSrc: Oral   Oral  SpO2: 90%  92% 91%  Weight:      Height:        Intake/Output Summary (Last 24 hours) at 08/14/2021 1311 Last data filed at 08/14/2021 1310 Gross per 24 hour  Intake 720 ml  Output 2150 ml  Net -1430 ml   Filed Weights   07/25/21 1500 07/28/21 0414 08/03/21 1625  Weight: 110 kg 110 kg 106.2 kg    Examination:   General: Not in pain or dyspnea, deconditioned with rapid shallow breathing  Neurology: Awake and alert, non focal  E ENT: positive pallor, no icterus, oral mucosa moist Cardiovascular: No JVD. S1-S2 present, rhythmic, no gallops, rubs, or murmurs. No lower extremity edema. Pulmonary: positive breath sounds  bilaterally, no wheezing or rhonchi on anterior auscultation Gastrointestinal. Abdomen protuberant, but soft and non tender Skin. No rashes Musculoskeletal: no joint deformities     Data Reviewed: I have personally reviewed following labs and imaging studies  CBC: Recent Labs  Lab 08/08/21 0300 08/11/21 0314  WBC 6.5 6.6  HGB 11.0* 10.9*  HCT 33.3* 33.2*  MCV 94.3 93.5  PLT 89* 87*   Basic Metabolic Panel: Recent Labs  Lab 08/08/21 0300 08/11/21 0314 08/13/21 0418 08/14/21 0430  NA 133* 136 138 136  K 3.8 4.0 4.4 3.7  CL 99 96* 97* 93*  CO2 29 32 32 36*  GLUCOSE 115* 108* 112* 110*  BUN 19 22 25* 32*  CREATININE 0.96 0.79 0.84 0.92  CALCIUM 7.5* 7.9* 8.3* 8.0*  MG 2.1  --   --   --    GFR: Estimated Creatinine Clearance: 82.1 mL/min (by C-G formula based on SCr of 0.92 mg/dL). Liver Function Tests: No results for input(s): AST, ALT, ALKPHOS, BILITOT, PROT, ALBUMIN in the last 168 hours. No results for input(s): LIPASE, AMYLASE in the last 168 hours. No results for input(s): AMMONIA in the last 168 hours. Coagulation Profile: No results for input(s): INR, PROTIME in the last 168 hours. Cardiac Enzymes: No results for input(s): CKTOTAL, CKMB, CKMBINDEX, TROPONINI in the last 168 hours. BNP (last 3 results) No results for input(s): PROBNP in the last 8760 hours. HbA1C: No results for input(s): HGBA1C in the last 72 hours. CBG: Recent Labs  Lab 08/13/21 1148 08/13/21 1731 08/13/21 2015 08/14/21 0741 08/14/21 1217  GLUCAP 227* 141* 163* 125* 153*   Lipid Profile: No results for input(s): CHOL, HDL, LDLCALC, TRIG, CHOLHDL, LDLDIRECT in the last 72 hours. Thyroid Function Tests: No results for input(s): TSH, T4TOTAL, FREET4, T3FREE, THYROIDAB in the last 72 hours. Anemia Panel: No results for input(s): VITAMINB12, FOLATE, FERRITIN, TIBC, IRON, RETICCTPCT in the last 72 hours.    Radiology Studies: I have reviewed all of the imaging during this hospital  visit personally     Scheduled Meds:  (feeding supplement) PROSource Plus  30 mL Oral Daily   apixaban  5 mg Oral BID   budesonide (PULMICORT) nebulizer solution  0.5 mg Nebulization BID   buPROPion  150 mg Oral Daily   chlorhexidine  15 mL Mouth Rinse BID   Chlorhexidine Gluconate Cloth  6 each Topical Daily   feeding supplement  237 mL Oral Q24H   furosemide  40 mg Intravenous BID   insulin aspart  0-15 Units Subcutaneous TID WC   insulin aspart  0-5 Units Subcutaneous QHS   insulin aspart  3 Units Subcutaneous TID WC   insulin glargine-yfgn  15 Units Subcutaneous QHS  ipratropium-albuterol  3 mL Inhalation BID   loratadine  10 mg Oral Daily   mouth rinse  15 mL Mouth Rinse q12n4p   metoprolol tartrate  50 mg Oral BID   multivitamin with minerals  1 tablet Oral Daily   oxybutynin  10 mg Oral Daily   pantoprazole  40 mg Oral Daily   predniSONE  20 mg Oral Q breakfast   simvastatin  20 mg Oral q1800   sodium chloride flush  3 mL Intravenous Q12H   tamsulosin  0.4 mg Oral BID   Continuous Infusions:   LOS: 21 days        [name] Gerome Apley, MD

## 2021-08-14 NOTE — Progress Notes (Signed)
Daughter, Tera Mater called and voices family concern about prognosis and plan of care. She states that medical Dr is saying one thing and that the Oncologist is saying something different about his diagnosis. She would like to schedule a time to speak with the care team and Dr.'s with his family present so that they are fully informed of prognosis and plan of care and what the next steps are. Will pass this information along and inform the day shift team of the family concerns and questions.  Daughter's phone number is [phone].

## 2021-08-14 NOTE — Progress Notes (Signed)
Patient's RR 30's and above at time this shift. Pt does not appear to be in distress, but does c/o SOB at times. Able to have a conversation and eat, but RR very shallow and fast. O2 increased to 7L HFNC d/t O2 Sats only 90-91% on 6L HFNC. MD Arrien at bedside and made aware, states this is not a change for patient. CXR ordered. Will continue to follow yellow MEWS protocol.

## 2021-08-14 NOTE — Progress Notes (Signed)
Nutrition Follow-up  DOCUMENTATION CODES:   Obesity unspecified  INTERVENTION:  - continue Ensure Enlive once/day and 30 ml Prosource Plus once/day.  - weigh patient today.    NUTRITION DIAGNOSIS:   Increased nutrient needs related to acute illness, chronic illness as evidenced by estimated needs. -ongoing  GOAL:   Patient will meet greater than or equal to 90% of their needs -minimally met on average  MONITOR:   PO intake, Supplement acceptance, Labs, Weight trends  ASSESSMENT:   80 y.o. male with medical history of CAD, insulin-dependent DM, OSA on CPAP, on 2L O2 since recent COVID infection, lung nodules and liver lesion being worked up outpatient, HTN, CAD, HLD, arthritis, fatty liver, diverticulosis, aortic insufficiency, and tricuspid regurgitation. Patient presented to the ED with increased shortness of breath and increased O2 requirement. He had been discharged from the hospital on 11/8.  Patient has been eating 50-100% at all meals since 12/17 per documentation in the flow sheet. Per review of orders, he has been accepting Ensure and Prosource Plus 90-100% of the time offered.   He has not been weighed since 12/10. Mild pitting edema to BLE documented in the edema section of flow sheet.     Labs reviewed; CBGs: 125, 153, 198 mg/dl, Cl: 93 mmol/l, BUN: 32 mg/dl, Ca: 8 mg/dl.   Medications reviewed; sliding scale novolog, 3 units novolog TID, 15 units semglee/day, 1 tablet multivitamin with minerals/day, 20 mg deltasone/day.   Diet Order:   Diet Order             Diet Carb Modified Fluid consistency: Thin; Room service appropriate? Yes  Diet effective now                   EDUCATION NEEDS:   Not appropriate for education at this time  Skin:  Skin Assessment: Skin Integrity Issues: Skin Integrity Issues:: Stage II, DTI DTI: sacrum (newly documented 12/13) Stage II: bilateral buttocks  Last BM:  12/20 (type 4 x1, medium amount)  Height:   Ht  Readings from Last 1 Encounters:  08/03/21 _0  (1.803 m)    Weight:   Wt Readings from Last 1 Encounters:  08/03/21 106.2 kg     Estimated Nutritional Needs:  Kcal:  2000-2200 kcal Protein:  105-125 grams Fluid:  >/= 1.7 L/day     Jarome Matin, MS, RD, LDN, CNSC Inpatient Clinical Dietitian RD pager # available in [facility]  After hours/weekend pager # available in [facility]

## 2021-08-14 NOTE — Progress Notes (Signed)
Occupational Therapy Treatment Patient Details Name: Jacob Bishop MRN: [id number] DOB: Apr 17, 1941 Today's Date: 08/14/2021   History of present illness 80 yo male admitted with acute PE, LE DVT, Afib with RVR. Hx of COVID, COPD-O2 dep at baseline   OT comments  Patient was noted to have soft BP with increased dizziness with sitting on edge of bed. Patient's BP was 90/52 mmhg sitting in chair position in bed. Patient after bathing and exercises was noted to have blood pressure of 92/54 mmhg. Nurse made aware. Patient would continue to benefit from skilled OT services at this time while admitted and after d/c to address noted deficits in order to improve overall safety and independence in ADLs.     Recommendations for follow up therapy are one component of a multi-disciplinary discharge planning process, led by the attending physician.  Recommendations may be updated based on patient status, additional functional criteria and insurance authorization.    Follow Up Recommendations  Skilled nursing-short term rehab (<3 hours/day)    Assistance Recommended at Discharge Frequent or constant Supervision/Assistance  Equipment Recommendations       Recommendations for Other Services      Precautions / Restrictions Precautions Precautions: Fall Precaution Comments: "Bad" right knee Restrictions Weight Bearing Restrictions: No       Mobility Bed Mobility                    Transfers                         Balance                                           ADL either performed or assessed with clinical judgement   ADL Overall ADL's : Needs assistance/impaired     Grooming: Set up;Sitting Grooming Details (indicate cue type and reason): in chair position in bed. Upper Body Bathing: Set up;Sitting Upper Body Bathing Details (indicate cue type and reason): in chair position in bed. Lower Body Bathing: Moderate assistance Lower Body Bathing  Details (indicate cue type and reason): in chair position in bed with increased time. Upper Body Dressing : Set up;Sitting Upper Body Dressing Details (indicate cue type and reason): in chair position in bed.                   General ADL Comments: 98/52mmhg chair position in bed with reports of dizziness. O2 91% on HFNC 6L nurse made aware. patient was able to maintain 90-91% on 6L HFNC during session.    Extremity/Trunk Assessment              Vision       Perception     Praxis      Cognition Arousal/Alertness: Awake/alert Behavior During Therapy: WFL for tasks assessed/performed Overall Cognitive Status: Within Functional Limits for tasks assessed                                            Exercises Other Exercises Other Exercises: patient participated in sitting fowards off the back of bed while in chair position to strengthen BUE and trunk controll x6 reps with SOB noted. patient was able to maintain 90% on 6L/ HFNC with education on deep  breathing strategies.   Shoulder Instructions       General Comments      Pertinent Vitals/ Pain       Pain Assessment: No/denies pain  Home Living                                          Prior Functioning/Environment              Frequency  Min 2X/week        Progress Toward Goals  OT Goals(current goals can now be found in the care plan section)  Progress towards OT goals: Progressing toward goals     Plan Discharge plan remains appropriate    Co-evaluation                 AM-PAC OT "6 Clicks" Daily Activity     Outcome Measure   Help from another person eating meals?: A Little Help from another person taking care of personal grooming?: A Little Help from another person toileting, which includes using toliet, bedpan, or urinal?: Total Help from another person bathing (including washing, rinsing, drying)?: A Lot Help from another person to put on and  taking off regular upper body clothing?: A Lot Help from another person to put on and taking off regular lower body clothing?: A Lot 6 Click Score: 13    End of Session Equipment Utilized During Treatment: Gait belt;Rolling walker (2 wheels)  OT Visit Diagnosis: Unsteadiness on feet (R26.81);Muscle weakness (generalized) (M62.81)   Activity Tolerance Patient tolerated treatment well   Patient Left in bed;with call bell/phone within reach;with bed alarm set   Nurse Communication Mobility status        Time: 6073-7106 OT Time Calculation (min): 25 min  Charges: OT General Charges $OT Visit: 1 Visit OT Treatments $Self Care/Home Management : 8-22 mins $Therapeutic Activity: 8-22 mins  Jackelyn Poling OTR/L, MS Acute Rehabilitation Department Office# [phone] Pager# [phone]   Marcellina Millin 08/14/2021, 1:13 PM

## 2021-08-15 LAB — GLUCOSE, CAPILLARY
Glucose-Capillary: 136 mg/dL — ABNORMAL HIGH (ref 70–99)
Glucose-Capillary: 190 mg/dL — ABNORMAL HIGH (ref 70–99)
Glucose-Capillary: 155 mg/dL — ABNORMAL HIGH (ref 70–99)
Glucose-Capillary: 227 mg/dL — ABNORMAL HIGH (ref 70–99)

## 2021-08-15 MED ORDER — HYDROCORTISONE SOD SUC (PF) 100 MG IJ SOLR
50.0000 mg | Freq: Two times a day (BID) | INTRAMUSCULAR | Status: DC
  Administered 2021-08-15 – 2021-08-18 (×6): 50 mg via INTRAVENOUS
  Filled 2021-08-15 (×6): qty 2

## 2021-08-15 NOTE — Progress Notes (Signed)
Patient placed on BiPAP at this time due to patient wanting to get a couple more hours of sleep. RN aware.

## 2021-08-15 NOTE — Progress Notes (Signed)
Patient with red MEWS, BP SBP 80s to 91/60 now. RR 30s-40s still, O2 Sats 95% on BiPAP. Pt much sleepier today, has been on BiPap most of the morning. Ate breakfast but did not eat lunch. Still alert to voice, but very drowsy. C/o feeling tired. Has not requested PRN Xanax this shift (whereas yesterday he was requesting it every hours). Having frequent missed beats per telemetry. MD Arrien aware. Awaiting new orders, will continue to monitor closely.

## 2021-08-15 NOTE — Progress Notes (Signed)
PROGRESS NOTE    DEVYN SHEERIN  JTT:[id number] DOB: 10/12/1940 DOA: 07/24/2021 PCP: [name], NP    Brief Narrative:  Mr. Kamphaus was admitted to the hospital with the working diagnosis of acute hypoxemic respiratory failure, in the setting of acute pulmonary embolism.    80 year old male past medical history for coronary artery disease, type 2 diabetes mellitus, and obstructive sleep apnea. He also has lung nodules and liver lesions.  Recent hospitalization 11/5-11/03/2021 for acute hypoxic respiratory failure due to SARS COVID-19 viral pneumonia.  By time of his discharge he was still very weak and deconditioned, but not until a few days prior to his rehospitalization he developed worsening dyspnea that prompted him to come back to the hospital.  On his initial physical examination his oximetry was in the low 90s on 5 L supplemental oxygen per nasal cannula, respiratory rate 26-31, heart rate 103, blood pressure 128/64, his lungs had no wheezing, heart S1-S2, present, rhythmic, abdomen soft nontender, positive right lower extremity edema.   Sodium 135, potassium 3.6, chloride 93, bicarb 32, glucose 143, BUN 12, creatinine 0.89, white count 7.0, hemoglobin 13.2, hematocrit 39.7, platelets 118. SARS COVID-19 negative.   Chest radiograph with hyperinflation.   CT chest with small segmental arterial embolus in the right upper lobe.  Multiple mildly enlarged mediastinal and hilar lymph nodes.  Numerous bilateral lung masses with several small new nodules without cavitation and with interval enlargement of pre-existing nodules.   EKG 85 bpm, normal axis, normal intervals, sinus rhythm, no significant ST segment changes, negative T wave lead III-aVF.   Patient was placed on heparin for anticoagulation. Unable to do biopsy due to respiratory failure. Was placed on systemic corticosteroids along with bronchodilators.   12/12 patient had worsening respiratory failure, transferred to stepdown unit  for noninvasive mechanical ventilation. Patient with metastatic lesions, palliative care was consulted.   CODE STATUS changed to DNR/DNI.   12/16 patient developed atrial fibrillation with RVR, placed on metoprolol with good response.    Pending transfer to SNF.   Patient continue to be very weak and deconditioned Using Bipap as needed.   Underwent diuresis with no much improvement in oxygenation.  Tapering steroids, he developed hypotension, possible adrenal insufficiency, now started on IV hydrocortisone.    Assessment & Plan:   Principal Problem:   Acute pulmonary embolus (HCC) Active Problems:   Obstructive sleep apnea   Essential hypertension   CAD, NATIVE VESSEL   Thrombocytopenia (HCC)   Mass of lung   Lesion of liver   Acute on chronic respiratory failure with hypoxia (HCC)   DM type 2 with diabetic mixed hyperlipidemia (HCC)   Depression   Pressure injury of skin   Chest pain   Palliative care by specialist   Acute on chronic hypoxemic respiratory failure, post SARS covid 19 viral pneumonia, acute pulmonary embolism, bilateral pulmonary nodules (worsening). Right lower extremity DVT.  Patient not sable for biopsy, but malignancy in the differential. (Metastatic disease).    Continue to use Bipap as needed and 7 L per min per high flow nasal cannula, he continue to have dyspnea despite diuresis. Follow up chest film with persistent bilateral nodules.    Plan to continue respiratory support, will need pulmonary rehab. Not able to perform further pulmonary diagnostics for lung nodules due to risk of worsening respiratory failure.   Pending placement in SNF or possibly LTAC   2. New onset atrial fibrillation/ acute on chronic diastolic heart failure.   Patient has been  diuresed with good toleration Continue to have dyspnea and high oxygen requirements Continue to hold diuresis for now and continue close monitoring on blood pressure    Patient now with  hypotension, poor energy and decreased po intake, will add stress dose steroids for possible adrenal insufficiency    2. T2DM/ dyslipidemia.  On  basal insulin 15 units plus per meal 3 units.   On insulin sliding scale.   He has poor oral intake, very weak and deconditioned    3. OSA/ obesity class 1. BMI is 32.6, continue with as needed bipap.    4. Thrombocytopenia/ anemia of chronic disease.  Follow cell count as needed.    5. Hyponatremia.  His electrolytes were corrected, will continue close follow up on renal function and electrolytes.    6. Hepatic cirrhosis with positive 4,7 low density lesion in the caudate lobe.  Possible hemangioma.  Will need outpatient follow up.    7. Stage 2 bilateral buttocks pressure ulcer (present on admission). Continue local wound care.    8. Anxiety.  Continue with PRN alprazolam and scheduled  bupropion    Patient continue to be at high risk for worsening hypoxemia   Status is: Inpatient  Remains inpatient appropriate because: respiratory support    DVT prophylaxis:  Apixaban   Code Status:    DNR   Family Communication:  No family at the bedside     Nutrition Status: Nutrition Problem: Increased nutrient needs Etiology: acute illness, chronic illness Signs/Symptoms: estimated needs Interventions: Ensure Enlive (each supplement provides 350kcal and 20 grams of protein), Prostat, MVI     Skin Documentation: Pressure Injury 07/25/21 Buttocks Right Stage 2 -  Partial thickness loss of dermis presenting as a shallow open injury with a red, pink wound bed without slough. (Active)  07/25/21 1530  Location: Buttocks  Location Orientation: Right  Staging: Stage 2 -  Partial thickness loss of dermis presenting as a shallow open injury with a red, pink wound bed without slough.  Wound Description (Comments):   Present on Admission: Yes     Pressure Injury 07/25/21 Buttocks Left Stage 2 -  Partial thickness loss of dermis presenting  as a shallow open injury with a red, pink wound bed without slough. (Active)  07/25/21 1531  Location: Buttocks  Location Orientation: Left  Staging: Stage 2 -  Partial thickness loss of dermis presenting as a shallow open injury with a red, pink wound bed without slough.  Wound Description (Comments):   Present on Admission: Yes     Pressure Injury 08/06/21 Sacrum Medial Deep Tissue Pressure Injury - Purple or maroon localized area of discolored intact skin or blood-filled blister due to damage of underlying soft tissue from pressure and/or shear. (Active)  08/06/21 1600  Location: Sacrum  Location Orientation: Medial  Staging: Deep Tissue Pressure Injury - Purple or maroon localized area of discolored intact skin or blood-filled blister due to damage of underlying soft tissue from pressure and/or shear.  Wound Description (Comments):   Present on Admission: No     Subjective: Patient continue to be very weak and deconditioned, positive dyspnea, and his blood pressure has been low.   Objective: Vitals:   08/15/21 0921 08/15/21 1421 08/15/21 1436 08/15/21 1518  BP: (!) 103/59 (!) 89/61 91/60 (!) 98/59  Pulse: 77 84 73 73  Resp: (!) 34 (!) 48 (!) 36   Temp: 98.9 F (37.2 C)  98.2 F (36.8 C) 98.1 F (36.7 C)  TempSrc: Oral  Oral  Axillary  SpO2: 92% 90% 95% 94%  Weight:      Height:        Intake/Output Summary (Last 24 hours) at 08/15/2021 1536 Last data filed at 08/15/2021 0454 Gross per 24 hour  Intake 240 ml  Output 950 ml  Net -710 ml   Filed Weights   07/25/21 1500 07/28/21 0414 08/03/21 1625  Weight: 110 kg 110 kg 106.2 kg    Examination:   General: Not in pain or dyspnea  Neurology: Awake and alert, non focal, very weak and deconditioned  E ENT: mild pallor, no icterus, oral mucosa moist Cardiovascular: No JVD. S1-S2 present, rhythmic, no gallops, rubs, or murmurs. No lower extremity edema. Pulmonary: positive breath sounds bilaterally,  with no wheezing,  but scattered bilateral rhonchi and rales. Gastrointestinal. Abdomen protuberant but not tender Skin. No rashes Musculoskeletal: no joint deformities     Data Reviewed: I have personally reviewed following labs and imaging studies  CBC: Recent Labs  Lab 08/11/21 0314  WBC 6.6  HGB 10.9*  HCT 33.2*  MCV 93.5  PLT 87*   Basic Metabolic Panel: Recent Labs  Lab 08/11/21 0314 08/13/21 0418 08/14/21 0430  NA 136 138 136  K 4.0 4.4 3.7  CL 96* 97* 93*  CO2 32 32 36*  GLUCOSE 108* 112* 110*  BUN 22 25* 32*  CREATININE 0.79 0.84 0.92  CALCIUM 7.9* 8.3* 8.0*   GFR: Estimated Creatinine Clearance: 82.1 mL/min (by C-G formula based on SCr of 0.92 mg/dL). Liver Function Tests: No results for input(s): AST, ALT, ALKPHOS, BILITOT, PROT, ALBUMIN in the last 168 hours. No results for input(s): LIPASE, AMYLASE in the last 168 hours. No results for input(s): AMMONIA in the last 168 hours. Coagulation Profile: No results for input(s): INR, PROTIME in the last 168 hours. Cardiac Enzymes: No results for input(s): CKTOTAL, CKMB, CKMBINDEX, TROPONINI in the last 168 hours. BNP (last 3 results) No results for input(s): PROBNP in the last 8760 hours. HbA1C: No results for input(s): HGBA1C in the last 72 hours. CBG: Recent Labs  Lab 08/14/21 1217 08/14/21 1541 08/14/21 2028 08/15/21 0821 08/15/21 1133  GLUCAP 153* 198* 167* 136* 190*   Lipid Profile: No results for input(s): CHOL, HDL, LDLCALC, TRIG, CHOLHDL, LDLDIRECT in the last 72 hours. Thyroid Function Tests: No results for input(s): TSH, T4TOTAL, FREET4, T3FREE, THYROIDAB in the last 72 hours. Anemia Panel: No results for input(s): VITAMINB12, FOLATE, FERRITIN, TIBC, IRON, RETICCTPCT in the last 72 hours.    Radiology Studies: I have reviewed all of the imaging during this hospital visit personally     Scheduled Meds:  (feeding supplement) PROSource Plus  30 mL Oral Daily   apixaban  5 mg Oral BID   budesonide  (PULMICORT) nebulizer solution  0.5 mg Nebulization BID   buPROPion  150 mg Oral Daily   chlorhexidine  15 mL Mouth Rinse BID   Chlorhexidine Gluconate Cloth  6 each Topical Daily   feeding supplement  237 mL Oral Q24H   insulin aspart  0-15 Units Subcutaneous TID WC   insulin aspart  0-5 Units Subcutaneous QHS   insulin aspart  3 Units Subcutaneous TID WC   insulin glargine-yfgn  15 Units Subcutaneous QHS   ipratropium-albuterol  3 mL Inhalation BID   loratadine  10 mg Oral Daily   mouth rinse  15 mL Mouth Rinse q12n4p   metoprolol tartrate  50 mg Oral BID   multivitamin with minerals  1 tablet Oral Daily  oxybutynin  10 mg Oral Daily   pantoprazole  40 mg Oral Daily   predniSONE  20 mg Oral Q breakfast   simvastatin  20 mg Oral q1800   sodium chloride flush  3 mL Intravenous Q12H   tamsulosin  0.4 mg Oral BID   Continuous Infusions:   LOS: 22 days        Lyrick Worland Gerome Apley, MD

## 2021-08-15 NOTE — Progress Notes (Signed)
Daughter, Cinda Quest, requesting to speak with Palliative team and MD Arrien regarding patient's plan of care and goals. Palliative notified by this RN d/t previous consult placed this admit, plan to see patient tomorrow. Daughter and MD Arrien aware.

## 2021-08-15 NOTE — Progress Notes (Signed)
RR and RT notified, assessed patient at bedside. CN also aware. Patient remains A&O, not in distress. O2 Sats stable on BiPAP. Pt very lethargic, arousable to voice. Will continue to monitor and follow red MEWS protocol.

## 2021-08-15 NOTE — Progress Notes (Signed)
Daily Progress Note   Patient Name: Jacob Bishop       Date: 08/15/2021 DOB: 04/14/1941  Age: 80 y.o. MRN#: [id number] Attending Physician: Jacob Bishop Primary Care Physician: Jacob Peng, NP Admit Date: 07/24/2021  Reason for Consultation/Follow-up: Establishing goals of care  Subjective: Alerted by bedside RN about the patient's rapid response and him being on a BiPAP.  Arrived at bedside, Jacob Bishop is awake and alert on the BiPAP, appears weaker, states that he does feel short of breath but also states that he believes the BiPAP is helping him.  When asked how he is feeling he simply states he is tired.  Call placed and discussed with daughter Jacob Bishop, see below.   Length of Stay: 22  Current Medications: Scheduled Meds:   (feeding supplement) PROSource Plus  30 mL Oral Daily   apixaban  5 mg Oral BID   budesonide (PULMICORT) nebulizer solution  0.5 mg Nebulization BID   buPROPion  150 mg Oral Daily   chlorhexidine  15 mL Mouth Rinse BID   Chlorhexidine Gluconate Cloth  6 each Topical Daily   feeding supplement  237 mL Oral Q24H   hydrocortisone sod succinate (SOLU-CORTEF) inj  50 mg Intravenous Q12H   insulin aspart  0-15 Units Subcutaneous TID WC   insulin aspart  0-5 Units Subcutaneous QHS   insulin aspart  3 Units Subcutaneous TID WC   insulin glargine-yfgn  15 Units Subcutaneous QHS   ipratropium-albuterol  3 mL Inhalation BID   loratadine  10 mg Oral Daily   mouth rinse  15 mL Mouth Rinse q12n4p   metoprolol tartrate  50 mg Oral BID   multivitamin with minerals  1 tablet Oral Daily   oxybutynin  10 mg Oral Daily   pantoprazole  40 mg Oral Daily   simvastatin  20 mg Oral q1800   sodium chloride flush  3 mL Intravenous Q12H   tamsulosin  0.4 mg Oral BID     Continuous Infusions:    PRN Meds: acetaminophen **OR** acetaminophen, albuterol, ALPRAZolam, alum & mag hydroxide-simeth, calcium carbonate, guaiFENesin-dextromethorphan, lip balm, polyethylene glycol, senna-docusate, sodium chloride  Physical Exam           Mild respiratory distress On BiPAP No edema Rate controlled Has venous stasis changes both LE No focal deficits  Vital  Signs: BP (!) 95/56 (BP Location: Left Arm)    Pulse 72    Temp 98.1 F (36.7 C) (Axillary)    Resp (!) 24    Ht 5\' 11"  (1.803 m)    Wt 106.2 kg    SpO2 92%    BMI 32.65 kg/m  SpO2: SpO2: 92 % O2 Device: O2 Device: Bi-PAP O2 Flow Rate: O2 Flow Rate (L/min): 7 L/min  Intake/output summary:  Intake/Output Summary (Last 24 hours) at 08/15/2021 1647 Last data filed at 08/15/2021 0454 Gross per 24 hour  Intake 240 ml  Output 950 ml  Net -710 ml    LBM: Last BM Date: 08/13/21 Baseline Weight: Weight: 110 kg Most recent weight: Weight: 106.2 kg       Palliative Assessment/Data:      Patient Active Problem List   Diagnosis Date Noted   Palliative care by specialist    Chest pain 07/27/2021   Depression 07/25/2021   Pressure injury of skin 07/25/2021   Acute pulmonary embolus ([place]) 07/24/2021   Acute on chronic respiratory failure with hypoxia ([place]) 06/30/2021   DM type 2 with diabetic mixed hyperlipidemia ([place]) 06/30/2021   Mass of lung 06/27/2021   Lesion of liver 06/27/2021   Chronic respiratory failure with hypoxia ([place]) 06/27/2021   OA (osteoarthritis) of knee 10/04/2018   Other secondary pulmonary hypertension ([place]) 04/06/2018   Osteoarthritis, hand 05/05/2014   Carpal tunnel syndrome 05/02/2014   Eczema 01/23/2014   Thrombocytopenia ([place]) 11/19/2012   Overactive bladder 08/24/2012   Osteoarthritis of right knee 01/22/2012   Benign positional vertigo 07/09/2011   CAD, NATIVE VESSEL 10/22/2009   EDEMA 08/03/2009   Obstructive sleep apnea 05/03/2009   SKIN LESION 09/23/2007    Diabetes mellitus type 2, uncontrolled 03/23/2007   Hyperlipidemia 03/23/2007   Essential hypertension 03/23/2007   COLONIC POLYPS, HX OF 03/23/2007   Morbid obesity ([place]) 03/23/2007    Palliative Care Assessment & Plan   Patient Profile:    Assessment:  Acute PE CT imaging showing multiple pulmonary nodules/masses as well as metastatic lesions in liver.  Has OSA DM, recent COVID-19 infection Functional decline Dyspnea on slight exertion   Recommendations/Plan: DNR/DNI Call placed and discussed with daughter Jacob Bishop, goals of care discussions revisited, discussed about patient's current condition.  Reviewed frankly but compassionately while there is no tissue diagnosis or biopsy that we have been able to do to prove that he has cancer, patient has rapid decline in his functional status and imaging suggesting of numerous bilateral lung masses, several small new nodules and pulmonary embolism and that all of this points to high likelihood of this being a malignancy.  Functional status continues to decline, patient continues to have high oxygen requirements, now requiring use of BiPAP, symptom burden from anxiety and shortness of breath.  Hence, we reviewed goals of care again.  Discussed about full scope of comfort measures and what that looks like, discussed about continuing with BiPAP for now as a symptom management measure.  With regards to full comfort measures discussed about discontinuation of BiPAP and use of continuous opioid infusions as well as benzodiazepines on an as-needed basis for aggressive symptom management at end-of-life.   Plan: Continue current care, continue BiPAP as a symptom management measure, continue Xanax as needed, will reach out again to patient's family on 08-16-2021 to continue to assess patient's condition as well as to continue discussions regarding goals of care.  PMT to follow.    Goals of Care and Additional Recommendations:  Limitations on Scope of  Treatment: Full Scope Treatment Only limitation is DO NOT RESUSCITATE/DO NOT INTUBATE. Code Status: After family meeting today Jacob Bishop is revised as DNR/DNI as of 08-05-2021.    Code Status Orders  (From admission, onward)           Start     Ordered   07/24/21 2305  Full code  Continuous        07/24/21 2306           Code Status History     Date Active Date Inactive Code Status Order ID Comments User Context   06/30/2021 0055 07/02/2021 2313 Full Code [id number]  Orene Desanctis, DO ED   10/04/2018 1538 10/11/2018 1930 Full Code [id number]  Gaynelle Arabian, MD Inpatient       Prognosis:  Guarded   Discharge Planning: To Be Determined  Care plan was discussed with patient and daughter on phone.   Thank you for allowing the Palliative Medicine Team to assist in the care of this patient.   Total Time 35 Prolonged Time Billed  no    Greater than 50%  of this time was spent counseling and coordinating care related to the above assessment and plan.  Loistine Chance, MD  Please contact Palliative Medicine Team phone at [phone] for questions and concerns.

## 2021-08-15 NOTE — Progress Notes (Signed)
Physical Therapy Treatment Patient Details Name: Jacob Bishop MRN: [id number] DOB: [birth year] Today's Date: 08/15/2021   History of Present Illness 80 yo male admitted with acute PE, LE DVT, Afib with RVR. Hx of COVID, COPD-O2 dep at baseline    PT Comments    Pt is progressing toward acute PT goals this session with performance of x3 sit to stand transfers using stedy and MOD A+2. Continue to recommend SNF at this time, as pt continues to require +2 assistance for performance and safety with all bed mobility and transfers, and unable to progress ambulation at this time. Pt will benefit from continued skilled PT to increase their independence and maximize safety with mobility.     Recommendations for follow up therapy are one component of a multi-disciplinary discharge planning process, led by the attending physician.  Recommendations may be updated based on patient status, additional functional criteria and insurance authorization.  Follow Up Recommendations  Skilled nursing-short term rehab (<3 hours/day)     Assistance Recommended at Discharge Frequent or constant Supervision/Assistance  Equipment Recommendations  None recommended by PT    Recommendations for Other Services       Precautions / Restrictions Precautions Precautions: Fall Precaution Comments: "Bad" right knee Restrictions Weight Bearing Restrictions: No     Mobility  Bed Mobility Overal bed mobility: Needs Assistance Bed Mobility: Supine to Sit;Sit to Supine     Supine to sit: Max assist;+2 for physical assistance;+2 for safety/equipment;HOB elevated Sit to supine: Max assist;+2 for physical assistance;+2 for safety/equipment;HOB elevated   General bed mobility comments: x2 during session.  Pt with worsening dizziness while sitting EOB, pt requesting to return to supine, BP in supine 119/64. Able to perform supine to sit again with assist, BP 100/60, 80bpm. Assist for trunk to upright, increased time for  progression of bil LEs. Utilized bedpad for scooting, positioning. Increaesd time. Cues provided to assist with use of UEs. UE/LE Circulation and strengthening interventions performed at EOB with improved BP 114/81, 72bpm and decrease in symptoms. O2 stable 93% throughout on 7L HFNC.    Transfers Overall transfer level: Needs assistance Equipment used: None (stedy) Transfers: Sit to/from Stand Sit to Stand: Mod assist;+2 physical assistance;+2 safety/equipment;From elevated surface           General transfer comment: x3; MOD A+2 with use of stedy and elevated bed surface to stand. Able to tolerate standing for ~20s before requesting to return to sitting EOB. Able to perform x3 with seated rest breaks between.    Ambulation/Gait                   Stairs             Wheelchair Mobility    Modified Rankin (Stroke Patients Only)       Balance Overall balance assessment: Needs assistance Sitting-balance support: Feet supported;Single extremity supported Sitting balance-Leahy Scale: Poor Sitting balance - Comments: Lt lateral lean in sitting requiring at least MIN A throughout to maintain upright and use of UE support   Standing balance support: Bilateral upper extremity supported;Reliant on assistive device for balance Standing balance-Leahy Scale: Poor Standing balance comment: useo f stedy and +2 assistance                            Cognition Arousal/Alertness: Awake/alert Behavior During Therapy: WFL for tasks assessed/performed Overall Cognitive Status: Within Functional Limits for tasks assessed  Exercises General Exercises - Lower Extremity Long Arc Quad: AROM;Both;15 reps;Seated Hip Flexion/Marching: AROM;Both;15 reps;Seated Other Exercises Other Exercises: UE alternating punches while sitting EOB for 30sec x2    General Comments        Pertinent Vitals/Pain Pain Assessment:  0-10 Pain Score: 6  Pain Location: generalized Pain Descriptors / Indicators: Discomfort;Sore Pain Intervention(s): Limited activity within patient's tolerance;Monitored during session;Repositioned    Home Living                          Prior Function            PT Goals (current goals can now be found in the care plan section) Acute Rehab PT Goals Patient Stated Goal: family is requesting rehab-pt seems agreeable PT Goal Formulation: With patient/family Time For Goal Achievement: 08/29/21 Potential to Achieve Goals: Good Progress towards PT goals: Progressing toward goals    Frequency    Min 2X/week      PT Plan Current plan remains appropriate    Co-evaluation              AM-PAC PT "6 Clicks" Mobility   Outcome Measure  Help needed turning from your back to your side while in a flat bed without using bedrails?: A Lot Help needed moving from lying on your back to sitting on the side of a flat bed without using bedrails?: Total Help needed moving to and from a bed to a chair (including a wheelchair)?: Total Help needed standing up from a chair using your arms (e.g., wheelchair or bedside chair)?: Total Help needed to walk in hospital room?: Total Help needed climbing 3-5 steps with a railing? : Total 6 Click Score: 7    End of Session   Activity Tolerance: Patient tolerated treatment well Patient left: in bed;with call bell/phone within reach;with bed alarm set Nurse Communication: Mobility status;Other (comment) (O2 sats) PT Visit Diagnosis: Muscle weakness (generalized) (M62.81);Difficulty in walking, not elsewhere classified (R26.2)     Time: 0962-8366 PT Time Calculation (min) (ACUTE ONLY): 35 min  Charges:  $Therapeutic Activity: 23-37 mins                     Festus Barren PT, DPT  Acute Rehabilitation Services  Office [phone]  08/15/2021, 3:45 PM

## 2021-08-16 DIAGNOSIS — R0602 Shortness of breath: Secondary | ICD-10-CM

## 2021-08-16 LAB — BASIC METABOLIC PANEL
Anion gap: 12 (ref 5–15)
BUN: 30 mg/dL — ABNORMAL HIGH (ref 8–23)
CO2: 32 mmol/L (ref 22–32)
Calcium: 8.1 mg/dL — ABNORMAL LOW (ref 8.9–10.3)
Chloride: 92 mmol/L — ABNORMAL LOW (ref 98–111)
Creatinine, Ser: 0.84 mg/dL (ref 0.61–1.24)
GFR, Estimated: >60 mL/min (ref >60)
Glucose, Bld: 143 mg/dL — ABNORMAL HIGH (ref 70–99)
Potassium: 3.6 mmol/L (ref 3.5–5.1)
Sodium: 136 mmol/L (ref 135–145)

## 2021-08-16 LAB — CBC
HCT: 33.3 % — ABNORMAL LOW (ref 39.0–52.0)
Hemoglobin: 11 g/dL — ABNORMAL LOW (ref 13.0–17.0)
MCH: 30.4 pg (ref 26.0–34.0)
MCHC: 33 g/dL (ref 30.0–36.0)
MCV: 92 fL (ref 80.0–100.0)
Platelets: 90 10*3/uL — ABNORMAL LOW (ref 150–400)
RBC: 3.62 MIL/uL — ABNORMAL LOW (ref 4.22–5.81)
RDW: 14.9 % (ref 11.5–15.5)
WBC: 7 10*3/uL (ref 4.0–10.5)
nRBC: 0 % (ref 0.0–0.2)

## 2021-08-16 LAB — GLUCOSE, CAPILLARY
Glucose-Capillary: 143 mg/dL — ABNORMAL HIGH (ref 70–99)
Glucose-Capillary: 174 mg/dL — ABNORMAL HIGH (ref 70–99)
Glucose-Capillary: 205 mg/dL — ABNORMAL HIGH (ref 70–99)
Glucose-Capillary: 184 mg/dL — ABNORMAL HIGH (ref 70–99)

## 2021-08-16 NOTE — Progress Notes (Signed)
PROGRESS NOTE    EVEREST BROD  AGT:[id number] DOB: 1941/08/12 DOA: 07/24/2021 PCP: [name], NP    Brief Narrative:  [name] was admitted to the hospital with the working diagnosis of acute hypoxemic respiratory failure, in the setting of acute pulmonary embolism.    80 year old male past medical history for coronary artery disease, type 2 diabetes mellitus, and obstructive sleep apnea. He also has lung nodules and liver lesions.  Recent hospitalization 11/5-11/03/2021 for acute hypoxic respiratory failure due to SARS COVID-19 viral pneumonia.  By time of his discharge he was still very weak and deconditioned, but not until a few days prior to his rehospitalization he developed worsening dyspnea that prompted him to come back to the hospital.  On his initial physical examination his oximetry was in the low 90s on 5 L supplemental oxygen per nasal cannula, respiratory rate 26-31, heart rate 103, blood pressure 128/64, his lungs had no wheezing, heart S1-S2, present, rhythmic, abdomen soft nontender, positive right lower extremity edema.   Sodium 135, potassium 3.6, chloride 93, bicarb 32, glucose 143, BUN 12, creatinine 0.89, white count 7.0, hemoglobin 13.2, hematocrit 39.7, platelets 118. SARS COVID-19 negative.   Chest radiograph with hyperinflation.   CT chest with small segmental arterial embolus in the right upper lobe.  Multiple mildly enlarged mediastinal and hilar lymph nodes.  Numerous bilateral lung masses with several small new nodules without cavitation and with interval enlargement of pre-existing nodules.   EKG 85 bpm, normal axis, normal intervals, sinus rhythm, no significant ST segment changes, negative T wave lead III-aVF.   Patient was placed on heparin for anticoagulation. Unable to do biopsy due to respiratory failure. Was placed on systemic corticosteroids along with bronchodilators.   12/12 patient had worsening respiratory failure, transferred to stepdown unit  for noninvasive mechanical ventilation. Patient with metastatic lesions, palliative care was consulted.   CODE STATUS changed to DNR/DNI.   12/16 patient developed atrial fibrillation with RVR, placed on metoprolol with good response.    Pending transfer to SNF.    Patient continue to be very weak and deconditioned Using Bipap as needed.    Underwent diuresis with no much improvement in oxygenation.   Tapering steroids, but he developed hypotension, possible adrenal insufficiency, now started on IV hydrocortisone.      Assessment & Plan:   Principal Problem:   Acute pulmonary embolus (H[facility]) Active Problems:   Obstructive sleep apnea   Essential hypertension   CAD, NATIVE VESSEL   Thrombocytopenia ([facility])   Mass of lung   Lesion of liver   Acute on chronic respiratory failure with hypoxia ([facility])   DM type 2 with diabetic mixed hyperlipidemia ([facility])   Depression   Pressure injury of skin   Chest pain   Palliative care by specialist   Acute on chronic hypoxemic respiratory failure, post SARS covid 19 viral pneumonia, acute pulmonary embolism, bilateral pulmonary nodules (worsening). Right lower extremity DVT.  Patient not sable for biopsy, but malignancy in the differential. (Metastatic disease).    This am with improved oxygenation 93% on 5 L/min per [place].  Continue to use Bipap as needed for comfort. Patient with poor mobility, but tolerating po well.  Plan to transfer to SNF.    Not able to perform further pulmonary diagnostics for lung nodules due to risk of worsening respiratory failure. Possible underlying malignancy.     2. New onset atrial fibrillation/ acute on chronic diastolic heart failure.   Continue rate control with metoprolol and anticoagulation  with apixaban. Continue telemetry monitoring.    2. T2DM/ dyslipidemia.  Continue with basal insulin 15 units plus per meal 3 units.   On insulin sliding scale.   his fasting glucose this am is 143    3. OSA/  obesity class 1. BMI is 32.6, continue with as needed bipap.    4. Thrombocytopenia/ anemia of chronic disease.  Follow up cell count with hgb at 11.0 hct 33.3 and plt 90. Continue close follow up on cell count, no current indication for transfusion    5. Hyponatremia.  Renal function is stable and electrolytes have been corrected Patient is tolerating po well.   6. Hepatic cirrhosis with positive 4,7 low density lesion in the caudate lobe.  Possible hemangioma.  Will need outpatient follow up.    7. Stage 2 bilateral buttocks pressure ulcer (present on admission). Continue local wound care.    8. Anxiety.   PRN alprazolam and scheduled  bupropion  9. NEW adrenal insufficiency. Blood pressure has improved with systemic steroids, continue with hydrocortisone 50 mg IV q12 hrs Will need a very slow taper/       Status is: Inpatient  Remains inpatient appropriate because: Pending transfer to SNF        DVT prophylaxis: Apixaban   Code Status:    DNR   Family Communication:   No family at the bedside      Nutrition Status: Nutrition Problem: Increased nutrient needs Etiology: acute illness, chronic illness Signs/Symptoms: estimated needs Interventions: Ensure Enlive (each supplement provides 350kcal and 20 grams of protein), Prostat, MVI     Skin Documentation: Pressure Injury 07/25/21 Buttocks Right Stage 2 -  Partial thickness loss of dermis presenting as a shallow open injury with a red, pink wound bed without slough. (Active)  07/25/21 1530  Location: Buttocks  Location Orientation: Right  Staging: Stage 2 -  Partial thickness loss of dermis presenting as a shallow open injury with a red, pink wound bed without slough.  Wound Description (Comments):   Present on Admission: Yes     Pressure Injury 07/25/21 Buttocks Left Stage 2 -  Partial thickness loss of dermis presenting as a shallow open injury with a red, pink wound bed without slough. (Active)  07/25/21  1531  Location: Buttocks  Location Orientation: Left  Staging: Stage 2 -  Partial thickness loss of dermis presenting as a shallow open injury with a red, pink wound bed without slough.  Wound Description (Comments):   Present on Admission: Yes     Pressure Injury 08/06/21 Sacrum Medial Deep Tissue Pressure Injury - Purple or maroon localized area of discolored intact skin or blood-filled blister due to damage of underlying soft tissue from pressure and/or shear. (Active)  08/06/21 1600  Location: Sacrum  Location Orientation: Medial  Staging: Deep Tissue Pressure Injury - Purple or maroon localized area of discolored intact skin or blood-filled blister due to damage of underlying soft tissue from pressure and/or shear.  Wound Description (Comments):   Present on Admission: No     Consultants:  Pulmonary  Palliative care     Subjective: Patient is feeling better than yesterday, his dyspnea has improved along with his blood pressure   Objective: Vitals:   08/16/21 0626 08/16/21 0822 08/16/21 0903 08/16/21 1147  BP: (!) 110/58     Pulse: 79     Resp: (!) 24     Temp: 98.1 F (36.7 C)     TempSrc: Axillary     SpO2: 91%  92% 93% 93%  Weight:      Height:        Intake/Output Summary (Last 24 hours) at 08/16/2021 1153 Last data filed at 08/15/2021 1655 Gross per 24 hour  Intake 0 ml  Output 450 ml  Net -450 ml   Filed Weights   07/25/21 1500 07/28/21 0414 08/03/21 1625  Weight: 110 kg 110 kg 106.2 kg    Examination:   General: Not in pain or dyspnea, deconditioned  Neurology: Awake and alert, non focal  E ENT: mild pallor, no icterus, oral mucosa moist Cardiovascular: No JVD. S1-S2 present, rhythmic, no gallops, rubs, or murmurs. ++ bilateral lower extremity edema. Pulmonary: positive breath sounds bilaterally, with no wheezing, or rhonchi scattered bilateral rales. Gastrointestinal. Abdomen protuberant but soft and non tender Skin. No rashes Musculoskeletal:  no joint deformities     Data Reviewed: I have personally reviewed following labs and imaging studies  CBC: Recent Labs  Lab 08/11/21 0314 08/16/21 0428  WBC 6.6 7.0  HGB 10.9* 11.0*  HCT 33.2* 33.3*  MCV 93.5 92.0  PLT 87* 90*   Basic Metabolic Panel: Recent Labs  Lab 08/11/21 0314 08/13/21 0418 08/14/21 0430 08/16/21 0428  NA 136 138 136 136  K 4.0 4.4 3.7 3.6  CL 96* 97* 93* 92*  CO2 32 32 36* 32  GLUCOSE 108* 112* 110* 143*  BUN 22 25* 32* 30*  CREATININE 0.79 0.84 0.92 0.84  CALCIUM 7.9* 8.3* 8.0* 8.1*   GFR: Estimated Creatinine Clearance: 89.9 mL/min (by C-G formula based on SCr of 0.84 mg/dL). Liver Function Tests: No results for input(s): AST, ALT, ALKPHOS, BILITOT, PROT, ALBUMIN in the last 168 hours. No results for input(s): LIPASE, AMYLASE in the last 168 hours. No results for input(s): AMMONIA in the last 168 hours. Coagulation Profile: No results for input(s): INR, PROTIME in the last 168 hours. Cardiac Enzymes: No results for input(s): CKTOTAL, CKMB, CKMBINDEX, TROPONINI in the last 168 hours. BNP (last 3 results) No results for input(s): PROBNP in the last 8760 hours. HbA1C: No results for input(s): HGBA1C in the last 72 hours. CBG: Recent Labs  Lab 08/15/21 0821 08/15/21 1133 08/15/21 1630 08/15/21 2037 08/16/21 0741  GLUCAP 136* 190* 155* 227* 143*   Lipid Profile: No results for input(s): CHOL, HDL, LDLCALC, TRIG, CHOLHDL, LDLDIRECT in the last 72 hours. Thyroid Function Tests: No results for input(s): TSH, T4TOTAL, FREET4, T3FREE, THYROIDAB in the last 72 hours. Anemia Panel: No results for input(s): VITAMINB12, FOLATE, FERRITIN, TIBC, IRON, RETICCTPCT in the last 72 hours.    Radiology Studies: I have reviewed all of the imaging during this hospital visit personally     Scheduled Meds:  (feeding supplement) PROSource Plus  30 mL Oral Daily   apixaban  5 mg Oral BID   budesonide (PULMICORT) nebulizer solution  0.5 mg  Nebulization BID   buPROPion  150 mg Oral Daily   chlorhexidine  15 mL Mouth Rinse BID   Chlorhexidine Gluconate Cloth  6 each Topical Daily   feeding supplement  237 mL Oral Q24H   hydrocortisone sod succinate (SOLU-CORTEF) inj  50 mg Intravenous Q12H   insulin aspart  0-15 Units Subcutaneous TID WC   insulin aspart  0-5 Units Subcutaneous QHS   insulin aspart  3 Units Subcutaneous TID WC   insulin glargine-yfgn  15 Units Subcutaneous QHS   ipratropium-albuterol  3 mL Inhalation BID   loratadine  10 mg Oral Daily   mouth rinse  15 mL Mouth  Rinse q12n4p   metoprolol tartrate  50 mg Oral BID   multivitamin with minerals  1 tablet Oral Daily   oxybutynin  10 mg Oral Daily   pantoprazole  40 mg Oral Daily   simvastatin  20 mg Oral q1800   sodium chloride flush  3 mL Intravenous Q12H   tamsulosin  0.4 mg Oral BID   Continuous Infusions:   LOS: 23 days        Kachina Niederer Gerome Apley, MD

## 2021-08-16 NOTE — Progress Notes (Signed)
Occupational Therapy Treatment Patient Details Name: Jacob Bishop MRN: [id number] DOB: 02-19-1941 Today's Date: 08/16/2021   History of present illness 80 yo male admitted with acute PE, LE DVT, Afib with RVR. Hx of COVID, COPD-O2 dep at baseline   OT comments  Patient progressing and showed improved ability to balance at EOB without UE support in order to participate in 2 grooming tasks with use of BUEs and no loss of balance with unsupported sitting, compared to previous session when pt required total support of recliner or bed to perform grooming tasks. Patient remains limited by mild confusion, but following instructions and cues for sequencing well, generalized weakness and significantly impaired activity tolerance with SpO2 89% on 5L via [place] and need of PLB to raise to 90%, along with deficits noted below. Pt continues to demonstrate fair rehab potential and would benefit from continued skilled OT to increase safety and independence with ADLs and functional transfers to allow pt to return home safely and reduce caregiver burden and fall risk.    Recommendations for follow up therapy are one component of a multi-disciplinary discharge planning process, led by the attending physician.  Recommendations may be updated based on patient status, additional functional criteria and insurance authorization.    Follow Up Recommendations  Skilled nursing-short term rehab (<3 hours/day)    Assistance Recommended at Discharge Frequent or constant Supervision/Assistance  Equipment Recommendations       Recommendations for Other Services      Precautions / Restrictions Precautions Precautions: Fall Precaution Comments: "Bad" right knee Restrictions Weight Bearing Restrictions: No       Mobility Bed Mobility Overal bed mobility: Needs Assistance Bed Mobility: Supine to Sit;Sit to Supine     Supine to sit: HOB elevated;Mod assist Sit to supine: Max assist;+2 for physical assistance;HOB  elevated   General bed mobility comments: With HOB fully upright, pt able to slowly move LEs off EOB with Mod As and raise trunk with Mod As of 1 person.  Pt required Max Assist of 2 people to return to supine and Total Asisst to scoot.    Transfers Overall transfer level: Needs assistance   Transfers: Sit to/from Stand             General transfer comment: Attmepted x 3. See ADL sectionfor more information.     Balance Overall balance assessment: Needs assistance Sitting-balance support: Feet supported;No upper extremity supported Sitting balance-Leahy Scale: Fair       Standing balance-Leahy Scale: Zero                             ADL either performed or assessed with clinical judgement   ADL Overall ADL's : Needs assistance/impaired     Grooming: Sitting;Oral care;Wash/dry face;Supervision/safety;Min guard;Set up Grooming Details (indicate cue type and reason): Pt sat EOB and initially required Min guard for trunk, but progressed to supervision without use of UE support to perform oral hygiene and face wash. Increased time for activities with pt resting while sitting EOB in between tasks.             Lower Body Dressing: Total assistance Lower Body Dressing Details (indicate cue type and reason): to don socks in bed.   Toilet Transfer Details (indicate cue type and reason): Attempted standing from elevated EOB x 3 tries with pt pulling up in recliner.  Pt able to clear hips 2/3 times with MAx As of 1, but unable to stand fully  upright.  Likely continues to need St[place] to stand. Toileting- Clothing Manipulation and Hygiene: Total assistance Toileting - Clothing Manipulation Details (indicate cue type and reason): Foley cath            Extremity/Trunk Assessment Upper Extremity Assessment Upper Extremity Assessment: Generalized weakness            Vision Patient Visual Report: No change from baseline     Perception     Praxis       Cognition Arousal/Alertness: Awake/alert Behavior During Therapy: WFL for tasks assessed/performed Overall Cognitive Status: Within Functional Limits for tasks assessed                                            Exercises Other Exercises Other Exercises: Pt declined exercises this session, too fatigued after EOB grooming and standing attemtps.   Shoulder Instructions       General Comments      Pertinent Vitals/ Pain       Pain Assessment: No/denies pain Faces Pain Scale: No hurt  Home Living                                          Prior Functioning/Environment              Frequency  Min 2X/week        Progress Toward Goals  OT Goals(current goals can now be found in the care plan section)  Progress towards OT goals: Progressing toward goals     Plan Discharge plan remains appropriate    Co-evaluation                 AM-PAC OT "6 Clicks" Daily Activity     Outcome Measure   Help from another person eating meals?: A Little Help from another person taking care of personal grooming?: A Little Help from another person toileting, which includes using toliet, bedpan, or urinal?: Total Help from another person bathing (including washing, rinsing, drying)?: A Lot Help from another person to put on and taking off regular upper body clothing?: A Lot Help from another person to put on and taking off regular lower body clothing?: Total 6 Click Score: 12    End of Session Equipment Utilized During Treatment: Gait belt  OT Visit Diagnosis: Unsteadiness on feet (R26.81);Muscle weakness (generalized) (M62.81)   Activity Tolerance Patient limited by fatigue   Patient Left in bed;with call bell/phone within reach;with bed alarm set;with family/visitor present   Nurse Communication Mobility status        Time: 6203-5597 OT Time Calculation (min): 31 min  Charges: OT General Charges $OT Visit: 1 Visit OT  Treatments $Self Care/Home Management : 8-22 mins $Therapeutic Activity: 8-22 mins  Jacob Bishop, Jacob Bishop Office: [phone] 08/16/2021  Jacob Bishop 08/16/2021, 2:20 PM

## 2021-08-16 NOTE — Progress Notes (Signed)
Daily Progress Note   Patient Name: Jacob Bishop       Date: 08/16/2021 DOB: [birth year]  Age: 80 y.o. MRN#: [id number] Attending Physician: Tawni Millers Primary Care Physician: [name], NP Admit Date: 07/24/2021  Reason for Consultation/Follow-up: Establishing goals of care  Subjective: Awake alert, on 5 L [place], no distress, answers questions well, denies pain, states he is not short of breath currently. Call placed and discussed with daughter Cyril Mourning about the patient's current condition and next steps, see below.     Length of Stay: 23  Current Medications: Scheduled Meds:   (feeding supplement) PROSource Plus  30 mL Oral Daily   apixaban  5 mg Oral BID   budesonide (PULMICORT) nebulizer solution  0.5 mg Nebulization BID   buPROPion  150 mg Oral Daily   chlorhexidine  15 mL Mouth Rinse BID   Chlorhexidine Gluconate Cloth  6 each Topical Daily   feeding supplement  237 mL Oral Q24H   hydrocortisone sod succinate (SOLU-CORTEF) inj  50 mg Intravenous Q12H   insulin aspart  0-15 Units Subcutaneous TID WC   insulin aspart  0-5 Units Subcutaneous QHS   insulin aspart  3 Units Subcutaneous TID WC   insulin glargine-yfgn  15 Units Subcutaneous QHS   ipratropium-albuterol  3 mL Inhalation BID   loratadine  10 mg Oral Daily   mouth rinse  15 mL Mouth Rinse q12n4p   metoprolol tartrate  50 mg Oral BID   multivitamin with minerals  1 tablet Oral Daily   oxybutynin  10 mg Oral Daily   pantoprazole  40 mg Oral Daily   simvastatin  20 mg Oral q1800   sodium chloride flush  3 mL Intravenous Q12H   tamsulosin  0.4 mg Oral BID    Continuous Infusions:    PRN Meds: acetaminophen **OR** acetaminophen, albuterol, ALPRAZolam, alum & mag hydroxide-simeth, calcium  carbonate, guaiFENesin-dextromethorphan, lip balm, polyethylene glycol, senna-docusate, sodium chloride  Physical Exam           No respiratory distress On [place] No edema Rate controlled Has venous stasis changes both LE No focal deficits  Vital Signs: BP (!) 97/58 (BP Location: Left Arm)    Pulse 76    Temp 97.7 F (36.5 C) (Oral)    Resp (!) 28    Ht  5\' 11"  (1.803 m)    Wt 106.2 kg    SpO2 93%    BMI 32.65 kg/m  SpO2: SpO2: 93 % O2 Device: O2 Device: Nasal Cannula O2 Flow Rate: O2 Flow Rate (L/min): 5 L/min  Intake/output summary:  Intake/Output Summary (Last 24 hours) at 08/16/2021 1507 Last data filed at 08/15/2021 1655 Gross per 24 hour  Intake 0 ml  Output 450 ml  Net -450 ml    LBM: Last BM Date: 08/13/21 Baseline Weight: Weight: 110 kg Most recent weight: Weight: 106.2 kg       Palliative Assessment/Data:      Patient Active Problem List   Diagnosis Date Noted   Palliative care by specialist    Chest pain 07/27/2021   Depression 07/25/2021   Pressure injury of skin 07/25/2021   Acute pulmonary embolus ([place]) 07/24/2021   Acute on chronic respiratory failure with hypoxia ([place]) 06/30/2021   DM type 2 with diabetic mixed hyperlipidemia ([place]) 06/30/2021   Mass of lung 06/27/2021   Lesion of liver 06/27/2021   Chronic respiratory failure with hypoxia ([place]) 06/27/2021   OA (osteoarthritis) of knee 10/04/2018   Other secondary pulmonary hypertension ([place]) 04/06/2018   Osteoarthritis, hand 05/05/2014   Carpal tunnel syndrome 05/02/2014   Eczema 01/23/2014   Thrombocytopenia ([place]) 11/19/2012   Overactive bladder 08/24/2012   Osteoarthritis of right knee 01/22/2012   Benign positional vertigo 07/09/2011   CAD, NATIVE VESSEL 10/22/2009   EDEMA 08/03/2009   Obstructive sleep apnea 05/03/2009   SKIN LESION 09/23/2007   Diabetes mellitus type 2, uncontrolled 03/23/2007   Hyperlipidemia 03/23/2007   Essential hypertension 03/23/2007   COLONIC POLYPS, HX OF  03/23/2007   Morbid obesity ([place]) 03/23/2007    Palliative Care Assessment & Plan   Patient Profile:    Assessment:  Acute PE CT imaging showing multiple pulmonary nodules/masses as well as metastatic lesions in liver.  Has OSA DM, recent COVID-19 infection Functional decline Dyspnea on slight exertion   Recommendations/Plan: DNR/DNI Call placed and discussed with daughter Cyril Mourning, goals of care discussions revisited, discussed about patient's current condition.  Monitor hospital course, family and patient's goals remain for SNF rehab with palliative.  Monitor hospital course, if patient has further acute decline, then we will have to discuss further about comfort care.  Goals at present are not for comfort measures only.    Goals of Care and Additional Recommendations: Limitations on Scope of Treatment: Full Scope Treatment Only limitation is DO NOT RESUSCITATE/DO NOT INTUBATE. Code Status: After family meeting today [place] is revised as DNR/DNI as of 08-05-2021.    Code Status Orders  (From admission, onward)           Start     Ordered   07/24/21 2305  Full code  Continuous        07/24/21 2306           Code Status History     Date Active Date Inactive Code Status Order ID Comments User Context   06/30/2021 0055 07/02/2021 2313 Full Code [id number]  Orene Desanctis, DO ED   10/04/2018 1538 10/11/2018 1930 Full Code [id number]  Gaynelle Arabian, MD Inpatient       Prognosis:  Guarded   Discharge Planning: To Be Determined  Care plan was discussed with patient and daughter on phone.   Thank you for allowing the Palliative Medicine Team to assist in the care of this patient.   Total Time 35 Prolonged Time Billed  no  Greater than 50%  of this time was spent counseling and coordinating care related to the above assessment and plan.  Loistine Chance, MD  Please contact Palliative Medicine Team phone at [phone] for questions and concerns.

## 2021-08-17 DIAGNOSIS — R531 Weakness: Secondary | ICD-10-CM

## 2021-08-17 LAB — GLUCOSE, CAPILLARY
Glucose-Capillary: 163 mg/dL — ABNORMAL HIGH (ref 70–99)
Glucose-Capillary: 140 mg/dL — ABNORMAL HIGH (ref 70–99)
Glucose-Capillary: 145 mg/dL — ABNORMAL HIGH (ref 70–99)

## 2021-08-17 MED ORDER — POLYETHYLENE GLYCOL 3350 17 G PO PACK
17.0000 g | PACK | Freq: Every day | ORAL | Status: DC
  Administered 2021-08-19 – 2021-08-25 (×7): 17 g via ORAL
  Filled 2021-08-17 (×7): qty 1

## 2021-08-17 NOTE — Progress Notes (Signed)
Daily Progress Note   Patient Name: Jacob Bishop       Date: 08/17/2021 DOB: [birth year]  Age: 80 y.o. MRN#: [id number] Attending Physician: Tawni Millers Primary Care Physician: [name], NP Admit Date: 07/24/2021  Reason for Consultation/Follow-up: Establishing goals of care  Subjective: Awake alert, on 4 L [place], no distress, answers questions well, denies pain, states he is not short of breath currently. Using BIPAP PRN.     Length of Stay: 24  Current Medications: Scheduled Meds:   (feeding supplement) PROSource Plus  30 mL Oral Daily   apixaban  5 mg Oral BID   budesonide (PULMICORT) nebulizer solution  0.5 mg Nebulization BID   buPROPion  150 mg Oral Daily   chlorhexidine  15 mL Mouth Rinse BID   Chlorhexidine Gluconate Cloth  6 each Topical Daily   feeding supplement  237 mL Oral Q24H   hydrocortisone sod succinate (SOLU-CORTEF) inj  50 mg Intravenous Q12H   insulin aspart  0-15 Units Subcutaneous TID WC   insulin aspart  0-5 Units Subcutaneous QHS   insulin aspart  3 Units Subcutaneous TID WC   insulin glargine-yfgn  15 Units Subcutaneous QHS   ipratropium-albuterol  3 mL Inhalation BID   loratadine  10 mg Oral Daily   mouth rinse  15 mL Mouth Rinse q12n4p   metoprolol tartrate  50 mg Oral BID   multivitamin with minerals  1 tablet Oral Daily   oxybutynin  10 mg Oral Daily   pantoprazole  40 mg Oral Daily   simvastatin  20 mg Oral q1800   sodium chloride flush  3 mL Intravenous Q12H   tamsulosin  0.4 mg Oral BID    Continuous Infusions:    PRN Meds: acetaminophen **OR** acetaminophen, albuterol, ALPRAZolam, alum & mag hydroxide-simeth, calcium carbonate, guaiFENesin-dextromethorphan, lip balm, polyethylene glycol, senna-docusate, sodium  chloride  Physical Exam           No respiratory distress On  No edema Rate controlled Has venous stasis changes both LE No focal deficits  Vital Signs: BP (!) 102/57 (BP Location: Left Arm)    Pulse 75    Temp 97.8 F (36.6 C) (Axillary)    Resp (!) 24    Ht 5\' 11"  (1.803 m)    Wt 106.2 kg    SpO2  94%    BMI 32.65 kg/m  SpO2: SpO2: 94 % O2 Device: O2 Device: Nasal Cannula O2 Flow Rate: O2 Flow Rate (L/min): 4 L/min  Intake/output summary:  Intake/Output Summary (Last 24 hours) at 08/17/2021 1240 Last data filed at 08/17/2021 6962 Gross per 24 hour  Intake --  Output 1000 ml  Net -1000 ml    LBM: Last BM Date: 08/13/21 Baseline Weight: Weight: 110 kg Most recent weight: Weight: 106.2 kg       Palliative Assessment/Data:      Patient Active Problem List   Diagnosis Date Noted   Shortness of breath    Palliative care by specialist    Chest pain 07/27/2021   Depression 07/25/2021   Pressure injury of skin 07/25/2021   Acute pulmonary embolus ([place]) 07/24/2021   Acute on chronic respiratory failure with hypoxia ([place]) 06/30/2021   DM type 2 with diabetic mixed hyperlipidemia ([place]) 06/30/2021   Mass of lung 06/27/2021   Lesion of liver 06/27/2021   Chronic respiratory failure with hypoxia ([place]) 06/27/2021   OA (osteoarthritis) of knee 10/04/2018   Other secondary pulmonary hypertension ([place]) 04/06/2018   Osteoarthritis, hand 05/05/2014   Carpal tunnel syndrome 05/02/2014   Eczema 01/23/2014   Thrombocytopenia ([place]) 11/19/2012   Overactive bladder 08/24/2012   Osteoarthritis of right knee 01/22/2012   Benign positional vertigo 07/09/2011   CAD, NATIVE VESSEL 10/22/2009   EDEMA 08/03/2009   Obstructive sleep apnea 05/03/2009   SKIN LESION 09/23/2007   Diabetes mellitus type 2, uncontrolled 03/23/2007   Hyperlipidemia 03/23/2007   Essential hypertension 03/23/2007   COLONIC POLYPS, HX OF 03/23/2007   Morbid obesity ([place]) 03/23/2007    Palliative Care  Assessment & Plan   Patient Profile:    Assessment:  Acute PE CT imaging showing multiple pulmonary nodules/masses as well as metastatic lesions in liver.  Has OSA DM, recent COVID-19 infection Functional decline Dyspnea on slight exertion   Recommendations/Plan: DNR/DNI Recommend SNF rehab with palliative.     Goals of Care and Additional Recommendations: Limitations on Scope of Treatment: Full Scope Treatment Only limitation is DO NOT RESUSCITATE/DO NOT INTUBATE. Code Status: After family meeting today [place] is revised as DNR/DNI as of 08-05-2021.    Code Status Orders  (From admission, onward)           Start     Ordered   07/24/21 2305  Full code  Continuous        07/24/21 2306           Code Status History     Date Active Date Inactive Code Status Order ID Comments User Context   06/30/2021 0055 07/02/2021 2313 Full Code [id number]  Orene Desanctis, DO ED   10/04/2018 1538 10/11/2018 1930 Full Code [id number]  Gaynelle Arabian, MD Inpatient       Prognosis:  Guarded   Discharge Planning: SNF rehab with palliative.   Care plan was discussed with patient and Dr Cathlean Sauer.    Thank you for allowing the Palliative Medicine Team to assist in the care of this patient.   Total Time 15 Prolonged Time Billed  no    Greater than 50%  of this time was spent counseling and coordinating care related to the above assessment and plan.  Loistine Chance, MD  Please contact Palliative Medicine Team phone at [phone] for questions and concerns.

## 2021-08-17 NOTE — Progress Notes (Signed)
PROGRESS NOTE    Jacob Bishop  ZHG:[id number] DOB: [birth year] DOA: 07/24/2021 PCP: Jacob Peng, NP    Brief Narrative:  Jacob Bishop was admitted to the hospital with the working diagnosis of acute hypoxemic respiratory failure, in the setting of acute pulmonary embolism.    80 year old male past medical history for coronary artery disease, type 2 diabetes mellitus, and obstructive sleep apnea. He also has lung nodules and liver lesions.  Recent hospitalization 11/5-11/03/2021 for acute hypoxic respiratory failure due to SARS COVID-19 viral pneumonia.  By time of his discharge he was still very weak and deconditioned, but not until a few days prior to his rehospitalization he developed worsening dyspnea that prompted him to come back to the hospital.  On his initial physical examination his oximetry was in the low 90s on 5 L supplemental oxygen per nasal cannula, respiratory rate 26-31, heart rate 103, blood pressure 128/64, his lungs had no wheezing, heart S1-S2, present, rhythmic, abdomen soft nontender, positive right lower extremity edema.   Sodium 135, potassium 3.6, chloride 93, bicarb 32, glucose 143, BUN 12, creatinine 0.89, white count 7.0, hemoglobin 13.2, hematocrit 39.7, platelets 118. SARS COVID-19 negative.   Chest radiograph with hyperinflation.   CT chest with small segmental arterial embolus in the right upper lobe.  Multiple mildly enlarged mediastinal and hilar lymph nodes.  Numerous bilateral lung masses with several small new nodules without cavitation and with interval enlargement of pre-existing nodules.   EKG 85 bpm, normal axis, normal intervals, sinus rhythm, no significant ST segment changes, negative T wave lead III-aVF.   Patient was placed on heparin for anticoagulation. Unable to do biopsy due to respiratory failure. Was placed on systemic corticosteroids along with bronchodilators.   12/12 patient had worsening respiratory failure, transferred to stepdown unit  for noninvasive mechanical ventilation. Patient with metastatic lesions, palliative care was consulted.   CODE STATUS changed to DNR/DNI.   12/16 patient developed atrial fibrillation with RVR, placed on metoprolol with good response.    Pending transfer to SNF.    Patient continue to be very weak and deconditioned Using Bipap as needed.    Underwent diuresis with no much improvement in oxygenation.   Tapering steroids, but he developed hypotension, possible adrenal insufficiency, now started on IV hydrocortisone.    Assessment & Plan:   Principal Problem:   Acute pulmonary embolus (HCC) Active Problems:   Obstructive sleep apnea   Essential hypertension   CAD, NATIVE VESSEL   Thrombocytopenia (HCC)   Mass of lung   Lesion of liver   Acute on chronic respiratory failure with hypoxia (HCC)   DM type 2 with diabetic mixed hyperlipidemia (HCC)   Depression   Pressure injury of skin   Chest pain   Palliative care by specialist   Shortness of breath     Acute on chronic hypoxemic respiratory failure, post SARS covid 19 viral pneumonia, acute pulmonary embolism, bilateral pulmonary nodules (worsening). Right lower extremity DVT.  Patient not sable for biopsy, but malignancy in the differential. (Metastatic disease).    Decreasing oxygen requirements, today is down to 4 L/min per [name] with oxygen saturation 94% Continue to use Bipap as needed for comfort. Patient with poor mobility, but tolerating po well. Continue to be very weak and deconditioned.  Pending to transfer to SNF.    Not able to perform further pulmonary diagnostics for lung nodules due to risk of worsening respiratory failure. Possible underlying malignancy.     2. New onset atrial fibrillation/  acute on chronic diastolic heart failure.   Rate has been controlled, continue with metoprolol and anticoagulation with apixaban. No signs of heart failure exacerbation, continue to hold on diuretics.    2. T2DM/  dyslipidemia.  Capillary glucose has been stable with 184 and 163 Tolerating well basal insulin 15 units plus per meal 3 units.   Continue with insulin sliding scale.     3. OSA/ obesity class 1. BMI is 32.6, continue with as needed bipap.    4. Thrombocytopenia/ anemia of chronic disease.  Follow up cell count as needed.     5. Hyponatremia.  Resolved     6. Hepatic cirrhosis with positive 4,7 low density lesion in the caudate lobe.  Possible hemangioma.  Will need outpatient follow up.    7. Stage 2 bilateral buttocks pressure ulcer (present on admission). Continue local wound care.    8. Anxiety.   Continue with PRN alprazolam and scheduled  bupropion   9. NEW adrenal insufficiency.  His blood pressure has improved with high dose steroids, will continue with hydrocortisone 50 mg IV q12 for 3 days, and will need a very slow taper.   Status is: Inpatient  Remains inpatient appropriate because: pending transfer to SNF   DVT prophylaxis: Apixaban    Code Status:   DNR Family Communication:  No family at the bedside      Nutrition Status: Nutrition Problem: Increased nutrient needs Etiology: acute illness, chronic illness Signs/Symptoms: estimated needs Interventions: Ensure Enlive (each supplement provides 350kcal and 20 grams of protein), Prostat, MVI     Skin Documentation: Pressure Injury 07/25/21 Buttocks Right Stage 2 -  Partial thickness loss of dermis presenting as a shallow open injury with a red, pink wound bed without slough. (Active)  07/25/21 1530  Location: Buttocks  Location Orientation: Right  Staging: Stage 2 -  Partial thickness loss of dermis presenting as a shallow open injury with a red, pink wound bed without slough.  Wound Description (Comments):   Present on Admission: Yes     Pressure Injury 07/25/21 Buttocks Left Stage 2 -  Partial thickness loss of dermis presenting as a shallow open injury with a red, pink wound bed without slough.  (Active)  07/25/21 1531  Location: Buttocks  Location Orientation: Left  Staging: Stage 2 -  Partial thickness loss of dermis presenting as a shallow open injury with a red, pink wound bed without slough.  Wound Description (Comments):   Present on Admission: Yes     Pressure Injury 08/06/21 Sacrum Medial Deep Tissue Pressure Injury - Purple or maroon localized area of discolored intact skin or blood-filled blister due to damage of underlying soft tissue from pressure and/or shear. (Active)  08/06/21 1600  Location: Sacrum  Location Orientation: Medial  Staging: Deep Tissue Pressure Injury - Purple or maroon localized area of discolored intact skin or blood-filled blister due to damage of underlying soft tissue from pressure and/or shear.  Wound Description (Comments):   Present on Admission: No     Consultants:  Palliative Care   Subjective: Patient continue to be very weak and deconditioned, his oxygen requirements are improving.   Objective: Vitals:   08/17/21 0834 08/17/21 0958 08/17/21 1028 08/17/21 1236  BP:  (!) 102/57    Pulse:  75    Resp:      Temp:      TempSrc:      SpO2: 93% 93% 92% 94%  Weight:      Height:  Intake/Output Summary (Last 24 hours) at 08/17/2021 1304 Last data filed at 08/17/2021 0437 Gross per 24 hour  Intake --  Output 1000 ml  Net -1000 ml   Filed Weights   07/25/21 1500 07/28/21 0414 08/03/21 1625  Weight: 110 kg 110 kg 106.2 kg    Examination:   General: deconditioned  Neurology: Awake and alert, non focal  E ENT: mild pallor, no icterus, oral mucosa moist Cardiovascular: No JVD. S1-S2 present, rhythmic, no gallops, rubs, or murmurs. Bilateral non pitting lower extremity edema. Pulmonary: positive breath sounds bilaterally, adequate air movement, no wheezing, rhonchi or rales. On anterior ausculation  Gastrointestinal. Abdomen soft and non tender Skin. No rashes Musculoskeletal: no joint deformities     Data  Reviewed: I have personally reviewed following labs and imaging studies  CBC: Recent Labs  Lab 08/11/21 0314 08/16/21 0428  WBC 6.6 7.0  HGB 10.9* 11.0*  HCT 33.2* 33.3*  MCV 93.5 92.0  PLT 87* 90*   Basic Metabolic Panel: Recent Labs  Lab 08/11/21 0314 08/13/21 0418 08/14/21 0430 08/16/21 0428  NA 136 138 136 136  K 4.0 4.4 3.7 3.6  CL 96* 97* 93* 92*  CO2 32 32 36* 32  GLUCOSE 108* 112* 110* 143*  BUN 22 25* 32* 30*  CREATININE 0.79 0.84 0.92 0.84  CALCIUM 7.9* 8.3* 8.0* 8.1*   GFR: Estimated Creatinine Clearance: 89.9 mL/min (by C-G formula based on SCr of 0.84 mg/dL). Liver Function Tests: No results for input(s): AST, ALT, ALKPHOS, BILITOT, PROT, ALBUMIN in the last 168 hours. No results for input(s): LIPASE, AMYLASE in the last 168 hours. No results for input(s): AMMONIA in the last 168 hours. Coagulation Profile: No results for input(s): INR, PROTIME in the last 168 hours. Cardiac Enzymes: No results for input(s): CKTOTAL, CKMB, CKMBINDEX, TROPONINI in the last 168 hours. BNP (last 3 results) No results for input(s): PROBNP in the last 8760 hours. HbA1C: No results for input(s): HGBA1C in the last 72 hours. CBG: Recent Labs  Lab 08/16/21 0741 08/16/21 1155 08/16/21 1643 08/16/21 2059 08/17/21 0747  GLUCAP 143* 174* 205* 184* 163*   Lipid Profile: No results for input(s): CHOL, HDL, LDLCALC, TRIG, CHOLHDL, LDLDIRECT in the last 72 hours. Thyroid Function Tests: No results for input(s): TSH, T4TOTAL, FREET4, T3FREE, THYROIDAB in the last 72 hours. Anemia Panel: No results for input(s): VITAMINB12, FOLATE, FERRITIN, TIBC, IRON, RETICCTPCT in the last 72 hours.    Radiology Studies: I have reviewed all of the imaging during this hospital visit personally     Scheduled Meds:  (feeding supplement) PROSource Plus  30 mL Oral Daily   apixaban  5 mg Oral BID   budesonide (PULMICORT) nebulizer solution  0.5 mg Nebulization BID   buPROPion  150 mg  Oral Daily   chlorhexidine  15 mL Mouth Rinse BID   Chlorhexidine Gluconate Cloth  6 each Topical Daily   feeding supplement  237 mL Oral Q24H   hydrocortisone sod succinate (SOLU-CORTEF) inj  50 mg Intravenous Q12H   insulin aspart  0-15 Units Subcutaneous TID WC   insulin aspart  0-5 Units Subcutaneous QHS   insulin aspart  3 Units Subcutaneous TID WC   insulin glargine-yfgn  15 Units Subcutaneous QHS   ipratropium-albuterol  3 mL Inhalation BID   loratadine  10 mg Oral Daily   mouth rinse  15 mL Mouth Rinse q12n4p   metoprolol tartrate  50 mg Oral BID   multivitamin with minerals  1 tablet Oral Daily  oxybutynin  10 mg Oral Daily   pantoprazole  40 mg Oral Daily   simvastatin  20 mg Oral q1800   sodium chloride flush  3 mL Intravenous Q12H   tamsulosin  0.4 mg Oral BID   Continuous Infusions:   LOS: 24 days        Jamian Andujo Gerome Apley, MD

## 2021-08-18 LAB — GLUCOSE, CAPILLARY
Glucose-Capillary: 203 mg/dL — ABNORMAL HIGH (ref 70–99)
Glucose-Capillary: 99 mg/dL (ref 70–99)
Glucose-Capillary: 139 mg/dL — ABNORMAL HIGH (ref 70–99)

## 2021-08-18 MED ORDER — HYDROCORTISONE 5 MG PO TABS
25.0000 mg | ORAL_TABLET | Freq: Two times a day (BID) | ORAL | Status: DC
  Administered 2021-08-18 – 2021-08-23 (×10): 25 mg via ORAL
  Filled 2021-08-18 (×10): qty 1

## 2021-08-18 NOTE — Progress Notes (Signed)
PROGRESS NOTE    Jacob Bishop  QQP:[id number] DOB: [birth year] DOA: 07/24/2021 PCP: [name], NP    Brief Narrative:  Mr. Kulikowski was admitted to the hospital with the working diagnosis of acute hypoxemic respiratory failure, in the setting of acute pulmonary embolism.    80 year old male past medical history for coronary artery disease, type 2 diabetes mellitus, and obstructive sleep apnea. He also has lung nodules and liver lesions.  Recent hospitalization 11/5-11/03/2021 for acute hypoxic respiratory failure due to SARS COVID-19 viral pneumonia.  By time of his discharge he was still very weak and deconditioned, but not until a few days prior to his rehospitalization he developed worsening dyspnea that prompted him to come back to the hospital.  On his initial physical examination his oximetry was in the low 90s on 5 L supplemental oxygen per nasal cannula, respiratory rate 26-31, heart rate 103, blood pressure 128/64, his lungs had no wheezing, heart S1-S2, present, rhythmic, abdomen soft nontender, positive right lower extremity edema.   Sodium 135, potassium 3.6, chloride 93, bicarb 32, glucose 143, BUN 12, creatinine 0.89, white count 7.0, hemoglobin 13.2, hematocrit 39.7, platelets 118. SARS COVID-19 negative.   Chest radiograph with hyperinflation.   CT chest with small segmental arterial embolus in the right upper lobe.  Multiple mildly enlarged mediastinal and hilar lymph nodes.  Numerous bilateral lung masses with several small new nodules without cavitation and with interval enlargement of pre-existing nodules.   EKG 85 bpm, normal axis, normal intervals, sinus rhythm, no significant ST segment changes, negative T wave lead III-aVF.   Patient was placed on heparin for anticoagulation. Unable to do biopsy due to respiratory failure. Was placed on systemic corticosteroids along with bronchodilators.   12/12 patient had worsening respiratory failure, transferred to stepdown unit  for noninvasive mechanical ventilation. Patient with metastatic lesions, palliative care was consulted.   CODE STATUS changed to DNR/DNI.   12/16 patient developed atrial fibrillation with RVR, placed on metoprolol with good response.    Pending transfer to SNF.    Patient continue to be very weak and deconditioned Using Bipap as needed.    Underwent diuresis with no much improvement in oxygenation.   Tapering steroids, but he developed hypotension, possible adrenal insufficiency, now started on IV hydrocortisone.    Assessment & Plan:   Principal Problem:   Acute pulmonary embolus (HCC) Active Problems:   Obstructive sleep apnea   Essential hypertension   CAD, NATIVE VESSEL   Thrombocytopenia (HCC)   Mass of lung   Lesion of liver   Acute on chronic respiratory failure with hypoxia (HCC)   DM type 2 with diabetic mixed hyperlipidemia (HCC)   Depression   Pressure injury of skin   Chest pain   Palliative care by specialist   Shortness of breath   Acute on chronic hypoxemic respiratory failure, post SARS covid 19 viral pneumonia, acute pulmonary embolism, bilateral pulmonary nodules (worsening). Right lower extremity DVT.  Patient not sable for biopsy, but malignancy in the differential. (Metastatic disease).  Not able to perform further pulmonary diagnostics for lung nodules due to risk of worsening respiratory failure. Possible underlying malignancy.     Patient continue to have dyspnea but his oxygen requirements have been stable, using 4 L/min per [place] with 02 saturation 95%.   Plan to continue supplemental 02 to keep oxygen saturation 88% or greater.  Using Bipap as needed for dyspnea and at night for sleeping.  Very weak and deconditioned, pending transfer to SNF  for further pulmonary rehab.  Out of bed to chair tid with meals and continue inpatient PT and OT.    2. New onset atrial fibrillation/ acute on chronic diastolic heart failure.   Continue metoprolol for  heart rate control and anticoagulation with apixaban. No signs of acute heart failure today, continue to hold on diuresis.   Blood pressure has been 95 to 144 mmHg systolic.    2. T2DM/ dyslipidemia.  Continue insulin therapy with 15 units basal plus per meal 3 units.   Continue with insulin sliding scale for glucose cover and monitoring      3. OSA/ obesity class 1. BMI is 32.6, continue with as needed bipap.    4. Thrombocytopenia/ anemia of chronic disease.  Follow up cell count as needed.     5. Hyponatremia.  Resolved     6. Hepatic cirrhosis with positive 4,7 low density lesion in the caudate lobe.  Possible hemangioma.  Will need outpatient follow up.    7. Stage 2 bilateral buttocks pressure ulcer (present on admission). Continue local wound care.    8. Anxiety.  PRN alprazolam and scheduled  bupropion   9. NEW adrenal insufficiency.  His blood pressure has improved with hydrocortisone, he has been on steroids during his prolonged hospitalization.  Patient has been on 50 mg hydrocortisone bid for 3 days, today will transition to 25 mg po bid and continue slow taper.    Status is: Inpatient  Remains inpatient appropriate because: pending transfer to SNF        DVT prophylaxis: Apixaban   Code Status:   DNR   Family Communication:   No family at the bedside      Nutrition Status: Nutrition Problem: Increased nutrient needs Etiology: acute illness, chronic illness Signs/Symptoms: estimated needs Interventions: Ensure Enlive (each supplement provides 350kcal and 20 grams of protein), Prostat, MVI     Skin Documentation: Pressure Injury 07/25/21 Buttocks Right Stage 2 -  Partial thickness loss of dermis presenting as a shallow open injury with a red, pink wound bed without slough. (Active)  07/25/21 1530  Location: Buttocks  Location Orientation: Right  Staging: Stage 2 -  Partial thickness loss of dermis presenting as a shallow open injury with a red,  pink wound bed without slough.  Wound Description (Comments):   Present on Admission: Yes     Pressure Injury 07/25/21 Buttocks Left Stage 2 -  Partial thickness loss of dermis presenting as a shallow open injury with a red, pink wound bed without slough. (Active)  07/25/21 1531  Location: Buttocks  Location Orientation: Left  Staging: Stage 2 -  Partial thickness loss of dermis presenting as a shallow open injury with a red, pink wound bed without slough.  Wound Description (Comments):   Present on Admission: Yes     Pressure Injury 08/06/21 Sacrum Medial Deep Tissue Pressure Injury - Purple or maroon localized area of discolored intact skin or blood-filled blister due to damage of underlying soft tissue from pressure and/or shear. (Active)  08/06/21 1600  Location: Sacrum  Location Orientation: Medial  Staging: Deep Tissue Pressure Injury - Purple or maroon localized area of discolored intact skin or blood-filled blister due to damage of underlying soft tissue from pressure and/or shear.  Wound Description (Comments):   Present on Admission: No     Consultants:  Palliative Care Pulmonary   Subjective: Patient continue to have dyspnea, very weak and deconditioned, episodic anxiety, no nausea or vomiting and tolerating po well.  Objective: Vitals:   08/18/21 0357 08/18/21 0558 08/18/21 0823 08/18/21 0949  BP:  96/67  116/60  Pulse:  78  (!) 101  Resp: (!) 22 (!) 30    Temp:  98 F (36.7 C)    TempSrc:      SpO2:  94% 95% 91%  Weight:      Height:        Intake/Output Summary (Last 24 hours) at 08/18/2021 1056 Last data filed at 08/18/2021 9937 Gross per 24 hour  Intake --  Output 1400 ml  Net -1400 ml   Filed Weights   07/25/21 1500 07/28/21 0414 08/03/21 1625  Weight: 110 kg 110 kg 106.2 kg    Examination:   General: Not in pain or dyspnea, deconditioned  Neurology: Awake and alert, non focal  E ENT: positive pallor, no icterus, oral mucosa  moist Cardiovascular: No JVD. S1-S2 present, rhythmic, no gallops, rubs, or murmurs. ++ bilateral lower extremity edema. Pulmonary: positive breath sounds bilaterally, with no wheezing, rhonchi or rales. Gastrointestinal. Abdomen protuberant but not distended or tender Skin. No rashes,mild erythema at the bridge of the nose.  Musculoskeletal: no joint deformities     Data Reviewed: I have personally reviewed following labs and imaging studies  CBC: Recent Labs  Lab 08/16/21 0428  WBC 7.0  HGB 11.0*  HCT 33.3*  MCV 92.0  PLT 90*   Basic Metabolic Panel: Recent Labs  Lab 08/13/21 0418 08/14/21 0430 08/16/21 0428  NA 138 136 136  K 4.4 3.7 3.6  CL 97* 93* 92*  CO2 32 36* 32  GLUCOSE 112* 110* 143*  BUN 25* 32* 30*  CREATININE 0.84 0.92 0.84  CALCIUM 8.3* 8.0* 8.1*   GFR: Estimated Creatinine Clearance: 89.9 mL/min (by C-G formula based on SCr of 0.84 mg/dL). Liver Function Tests: No results for input(s): AST, ALT, ALKPHOS, BILITOT, PROT, ALBUMIN in the last 168 hours. No results for input(s): LIPASE, AMYLASE in the last 168 hours. No results for input(s): AMMONIA in the last 168 hours. Coagulation Profile: No results for input(s): INR, PROTIME in the last 168 hours. Cardiac Enzymes: No results for input(s): CKTOTAL, CKMB, CKMBINDEX, TROPONINI in the last 168 hours. BNP (last 3 results) No results for input(s): PROBNP in the last 8760 hours. HbA1C: No results for input(s): HGBA1C in the last 72 hours. CBG: Recent Labs  Lab 08/16/21 1643 08/16/21 2059 08/17/21 0747 08/17/21 1630 08/17/21 2109  GLUCAP 205* 184* 163* 140* 145*   Lipid Profile: No results for input(s): CHOL, HDL, LDLCALC, TRIG, CHOLHDL, LDLDIRECT in the last 72 hours. Thyroid Function Tests: No results for input(s): TSH, T4TOTAL, FREET4, T3FREE, THYROIDAB in the last 72 hours. Anemia Panel: No results for input(s): VITAMINB12, FOLATE, FERRITIN, TIBC, IRON, RETICCTPCT in the last 72  hours.    Radiology Studies: I have reviewed all of the imaging during this hospital visit personally     Scheduled Meds:  (feeding supplement) PROSource Plus  30 mL Oral Daily   apixaban  5 mg Oral BID   budesonide (PULMICORT) nebulizer solution  0.5 mg Nebulization BID   buPROPion  150 mg Oral Daily   chlorhexidine  15 mL Mouth Rinse BID   Chlorhexidine Gluconate Cloth  6 each Topical Daily   feeding supplement  237 mL Oral Q24H   hydrocortisone sod succinate (SOLU-CORTEF) inj  50 mg Intravenous Q12H   insulin aspart  0-15 Units Subcutaneous TID WC   insulin aspart  0-5 Units Subcutaneous QHS   insulin aspart  3 Units Subcutaneous TID WC   insulin glargine-yfgn  15 Units Subcutaneous QHS   ipratropium-albuterol  3 mL Inhalation BID   loratadine  10 mg Oral Daily   mouth rinse  15 mL Mouth Rinse q12n4p   metoprolol tartrate  50 mg Oral BID   multivitamin with minerals  1 tablet Oral Daily   oxybutynin  10 mg Oral Daily   pantoprazole  40 mg Oral Daily   polyethylene glycol  17 g Oral Daily   simvastatin  20 mg Oral q1800   sodium chloride flush  3 mL Intravenous Q12H   tamsulosin  0.4 mg Oral BID   Continuous Infusions:   LOS: 25 days        Alekai Pocock Gerome Apley, MD

## 2021-08-19 DIAGNOSIS — I824Z1 Acute embolism and thrombosis of unspecified deep veins of right distal lower extremity: Secondary | ICD-10-CM

## 2021-08-19 DIAGNOSIS — I2693 Single subsegmental pulmonary embolism without acute cor pulmonale: Principal | ICD-10-CM

## 2021-08-19 LAB — CBC
HCT: 33.6 % — ABNORMAL LOW (ref 39.0–52.0)
Hemoglobin: 11.1 g/dL — ABNORMAL LOW (ref 13.0–17.0)
MCH: 30.2 pg (ref 26.0–34.0)
MCHC: 33 g/dL (ref 30.0–36.0)
MCV: 91.6 fL (ref 80.0–100.0)
Platelets: 120 10*3/uL — ABNORMAL LOW (ref 150–400)
RBC: 3.67 MIL/uL — ABNORMAL LOW (ref 4.22–5.81)
RDW: 15.3 % (ref 11.5–15.5)
WBC: 8.2 10*3/uL (ref 4.0–10.5)
nRBC: 0 % (ref 0.0–0.2)

## 2021-08-19 LAB — BASIC METABOLIC PANEL
Anion gap: 8 (ref 5–15)
BUN: 30 mg/dL — ABNORMAL HIGH (ref 8–23)
CO2: 34 mmol/L — ABNORMAL HIGH (ref 22–32)
Calcium: 7.8 mg/dL — ABNORMAL LOW (ref 8.9–10.3)
Chloride: 92 mmol/L — ABNORMAL LOW (ref 98–111)
Creatinine, Ser: 0.81 mg/dL (ref 0.61–1.24)
GFR, Estimated: >60 mL/min (ref >60)
Glucose, Bld: 147 mg/dL — ABNORMAL HIGH (ref 70–99)
Potassium: 4.4 mmol/L (ref 3.5–5.1)
Sodium: 134 mmol/L — ABNORMAL LOW (ref 135–145)

## 2021-08-19 LAB — GLUCOSE, CAPILLARY
Glucose-Capillary: 231 mg/dL — ABNORMAL HIGH (ref 70–99)
Glucose-Capillary: 156 mg/dL — ABNORMAL HIGH (ref 70–99)
Glucose-Capillary: 131 mg/dL — ABNORMAL HIGH (ref 70–99)
Glucose-Capillary: 150 mg/dL — ABNORMAL HIGH (ref 70–99)
Glucose-Capillary: 158 mg/dL — ABNORMAL HIGH (ref 70–99)
Glucose-Capillary: 163 mg/dL — ABNORMAL HIGH (ref 70–99)

## 2021-08-19 NOTE — Progress Notes (Signed)
PROGRESS NOTE    Jacob Bishop  JQB:[id number] DOB: 06-13-1941 DOA: 07/24/2021 PCP: [name], NP   Chief Complaint  Patient presents with   Shortness of Breath    Brief Narrative:  Jacob Bishop was admitted to the hospital with the working diagnosis of acute hypoxemic respiratory failure, in the setting of acute pulmonary embolism.    80 year old male past medical history for coronary artery disease, type 2 diabetes mellitus, and obstructive sleep apnea. He also has lung nodules and liver lesions.  Recent hospitalization 11/5-11/03/2021 for acute hypoxic respiratory failure due to SARS COVID-19 viral pneumonia.  By time of his discharge he was still very weak and deconditioned, but not until a few days prior to his rehospitalization he developed worsening dyspnea that prompted him to come back to the hospital.  On his initial physical examination his oximetry was in the low 90s on 5 L supplemental oxygen per nasal cannula, respiratory rate 26-31, heart rate 103, blood pressure 128/64, his lungs had no wheezing, heart S1-S2, present, rhythmic, abdomen soft nontender, positive right lower extremity edema.   Sodium 135, potassium 3.6, chloride 93, bicarb 32, glucose 143, BUN 12, creatinine 0.89, white count 7.0, hemoglobin 13.2, hematocrit 39.7, platelets 118. SARS COVID-19 negative.   Chest radiograph with hyperinflation.   CT chest with small segmental arterial embolus in the right upper lobe.  Multiple mildly enlarged mediastinal and hilar lymph nodes.  Numerous bilateral lung masses with several small new nodules without cavitation and with interval enlargement of pre-existing nodules.   EKG 85 bpm, normal axis, normal intervals, sinus rhythm, no significant ST segment changes, negative T wave lead III-aVF.   Patient was placed on heparin for anticoagulation. Unable to do biopsy due to respiratory failure. Was placed on systemic corticosteroids along with bronchodilators.   12/12  patient had worsening respiratory failure, transferred to stepdown unit for noninvasive mechanical ventilation. Patient with metastatic lesions, palliative care was consulted.   CODE STATUS changed to DNR/DNI.   12/16 patient developed atrial fibrillation with RVR, placed on metoprolol with good response.    Pending transfer to SNF.    Patient continue to be very weak and deconditioned Using Bipap as needed.    Underwent diuresis with no much improvement in oxygenation.   Tapering steroids, but he developed hypotension, possible adrenal insufficiency, now started on IV hydrocortisone.      Assessment & Plan:   Principal Problem:   Acute pulmonary embolus (HCC) Active Problems:   Obstructive sleep apnea   Essential hypertension   CAD, NATIVE VESSEL   Thrombocytopenia (HCC)   Mass of lung   Lesion of liver   Acute on chronic respiratory failure with hypoxia (HCC)   DM type 2 with diabetic mixed hyperlipidemia (HCC)   Depression   Pressure injury of skin   Chest pain   Palliative care by specialist   Shortness of breath  #1 acute on chronic hypoxemic respiratory failure, post SARS COVID-19 viral pneumonia, acute PE, bilateral pulmonary nodules worsening, right lower extremity DVT -Patient deemed not stable for biopsy but malignancy in differential due to concern for metastatic disease. -Due to patient's respiratory status unable to perform further pulmonary diagnostics for lung nodules and possible underlying malignancy. -Patient with ongoing dyspnea on 5 L nasal cannula. -Continue supplemental O2 to keep sats 88% or greater, BiPAP as needed dyspnea and at bedtime for sleeping. -Continue bronchodilators, Claritin, -Patient very deconditioned and will need transfer to SNF for further rehab.  2.  New onset  A. fib/acute on chronic diastolic CHF -Continue metoprolol for rate control, Eliquis for anticoagulation. -Diuretics on hold as blood pressure has been borderline.   Follow.  3.  Type 2 diabetes mellitus/dyslipidemia -Hemoglobin A1c 6.2 (07/01/2021). -CBG 131 this morning. -Continue Semglee, SSI.  4.  OSA/obesity class I -Weight loss. -Outpatient follow-up with PCP.  5.  Thrombocytopenia/anemia of chronic disease -Stable. -Monitor on anticoagulation.  6.  Hyponatremia -Resolved.  7.  Hepatic cirrhosis with 4.7 low-density lesion in the caudate lobe -Concern for hemangioma -Outpatient follow-up.  8.  Anxiety -Continue Wellbutrin, as needed alprazolam.  9.  New adrenal insufficiency -Blood pressure improved with hydrocortisone patient noted to have been on steroids during his prolonged hospitalization. -Was on hydrocortisone 50 mg twice daily x3 days, currently on 25 mg twice daily and will taper down to hydrocortisone 15 mg every morning and 10 mg every afternoon.  10.  Stage II bilateral buttocks pressure ulcer, POA -Continue current wound care. Pressure Injury 07/25/21 Buttocks Right Stage 2 -  Partial thickness loss of dermis presenting as a shallow open injury with a red, pink wound bed without slough. (Active)  07/25/21 1530  Location: Buttocks  Location Orientation: Right  Staging: Stage 2 -  Partial thickness loss of dermis presenting as a shallow open injury with a red, pink wound bed without slough.  Wound Description (Comments):   Present on Admission: Yes     Pressure Injury 07/25/21 Buttocks Left Stage 2 -  Partial thickness loss of dermis presenting as a shallow open injury with a red, pink wound bed without slough. (Active)  07/25/21 1531  Location: Buttocks  Location Orientation: Left  Staging: Stage 2 -  Partial thickness loss of dermis presenting as a shallow open injury with a red, pink wound bed without slough.  Wound Description (Comments):   Present on Admission: Yes     Pressure Injury 08/06/21 Sacrum Medial Deep Tissue Pressure Injury - Purple or maroon localized area of discolored intact skin or blood-filled  blister due to damage of underlying soft tissue from pressure and/or shear. (Active)  08/06/21 1600  Location: Sacrum  Location Orientation: Medial  Staging: Deep Tissue Pressure Injury - Purple or maroon localized area of discolored intact skin or blood-filled blister due to damage of underlying soft tissue from pressure and/or shear.  Wound Description (Comments):   Present on Admission: No         DVT prophylaxis: Eliquis Code Status: DNR Family Communication: Updated patient.  No family at bedside. Disposition:   Status is: Inpatient  Remains inpatient appropriate because: Severity of illness       Consultants:  PCCM: Dr. Lake Bells 07/29/2021 Palliative care: Dr. Rowe Pavy 08/01/2021  Procedures:  CT angiogram chest 07/24/2021 Chest x-ray 08/14/2021, 08/03/2021, 07/31/2021, 08/23/2021 Abdominal ultrasound 07/29/2021 2D echo 07/25/2021 Lower extremity Dopplers 07/25/2021  Antimicrobials:  Anti-infectives (From admission, onward)    None         Subjective: Sitting up in bed eating crackers.  On 5 L nasal cannula.  States still short of breath with no significant change since admission.  No chest pain.  No abdominal pain.  Objective: Vitals:   08/19/21 0317 08/19/21 0423 08/19/21 0827 08/19/21 1133  BP:  94/65  (!) 101/55  Pulse: 79 79  80  Resp: (!) 22 (!) 24  17  Temp:  97.8 F (36.6 C)  97.6 F (36.4 C)  TempSrc:  Oral    SpO2:  94% 90% 95%  Weight:  107.4 kg  Height:        Intake/Output Summary (Last 24 hours) at 08/19/2021 1312 Last data filed at 08/19/2021 0600 Gross per 24 hour  Intake 120 ml  Output 1100 ml  Net -980 ml   Filed Weights   07/28/21 0414 08/03/21 1625 08/19/21 0423  Weight: 110 kg 106.2 kg 107.4 kg    Examination:  General exam: Appears calm and comfortable  Respiratory system: Some coarse breath sounds anterior lung fields.  Shallow breaths.  Cardiovascular system: S1 & S2 heard, RRR. No JVD, murmurs, rubs, gallops or  clicks. No pedal edema. Gastrointestinal system: Abdomen is nondistended, soft and nontender. No organomegaly or masses felt. Normal bowel sounds heard. Central nervous system: Alert and oriented. No focal neurological deficits. Extremities: Symmetric 5 x 5 power. Skin: No rashes, lesions or ulcers Psychiatry: Judgement and insight appear normal. Mood & affect appropriate.     Data Reviewed: I have personally reviewed following labs and imaging studies  CBC: Recent Labs  Lab 08/16/21 0428 08/19/21 0350  WBC 7.0 8.2  HGB 11.0* 11.1*  HCT 33.3* 33.6*  MCV 92.0 91.6  PLT 90* 120*    Basic Metabolic Panel: Recent Labs  Lab 08/13/21 0418 08/14/21 0430 08/16/21 0428 08/19/21 0350  NA 138 136 136 134*  K 4.4 3.7 3.6 4.4  CL 97* 93* 92* 92*  CO2 32 36* 32 34*  GLUCOSE 112* 110* 143* 147*  BUN 25* 32* 30* 30*  CREATININE 0.84 0.92 0.84 0.81  CALCIUM 8.3* 8.0* 8.1* 7.8*    GFR: Estimated Creatinine Clearance: 93.7 mL/min (by C-G formula based on SCr of 0.81 mg/dL).  Liver Function Tests: No results for input(s): AST, ALT, ALKPHOS, BILITOT, PROT, ALBUMIN in the last 168 hours.  CBG: Recent Labs  Lab 08/18/21 1149 08/18/21 1638 08/18/21 1949 08/19/21 0720 08/19/21 1130  GLUCAP 203* 99 139* 131* 150*     Recent Results (from the past 240 hour(s))  Urine Culture     Status: Abnormal   Collection Time: 08/12/21  4:07 AM   Specimen: In/Out Cath Urine  Result Value Ref Range Status   Specimen Description   Final    IN/OUT CATH URINE Performed at [facility] [address], [place], [place] [address]    Special Requests   Final    NONE Performed at [facility], [place] [address], [place], [place] [address]    Culture >=100,000 COLONIES/mL ENTEROCOCCUS FAECALIS (A)  Final   Report Status 08/14/2021 FINAL  Final   Organism ID, Bacteria ENTEROCOCCUS FAECALIS (A)  Final      Susceptibility   Enterococcus faecalis - MIC*     AMPICILLIN <=2 SENSITIVE Sensitive     NITROFURANTOIN <=16 SENSITIVE Sensitive     VANCOMYCIN 1 SENSITIVE Sensitive     * >=100,000 COLONIES/mL ENTEROCOCCUS FAECALIS         Radiology Studies: No results found.      Scheduled Meds:  (feeding supplement) PROSource Plus  30 mL Oral Daily   apixaban  5 mg Oral BID   budesonide (PULMICORT) nebulizer solution  0.5 mg Nebulization BID   buPROPion  150 mg Oral Daily   chlorhexidine  15 mL Mouth Rinse BID   Chlorhexidine Gluconate Cloth  6 each Topical Daily   feeding supplement  237 mL Oral Q24H   hydrocortisone  25 mg Oral BID   insulin aspart  0-15 Units Subcutaneous TID WC   insulin aspart  0-5 Units Subcutaneous QHS  insulin aspart  3 Units Subcutaneous TID WC   insulin glargine-yfgn  15 Units Subcutaneous QHS   ipratropium-albuterol  3 mL Inhalation BID   loratadine  10 mg Oral Daily   mouth rinse  15 mL Mouth Rinse q12n4p   metoprolol tartrate  50 mg Oral BID   multivitamin with minerals  1 tablet Oral Daily   oxybutynin  10 mg Oral Daily   pantoprazole  40 mg Oral Daily   polyethylene glycol  17 g Oral Daily   simvastatin  20 mg Oral q1800   sodium chloride flush  3 mL Intravenous Q12H   tamsulosin  0.4 mg Oral BID   Continuous Infusions:   LOS: 26 days    Time spent: 40 minutes    Irine Seal, MD [facility]   To contact the attending provider between 7A-7P or the covering provider during after hours 7P-7A, please log into the web site [email or web address] and access using universal [place] password for that web site. If you do not have the password, please call the hospital operator.  08/19/2021, 1:12 PM

## 2021-08-19 NOTE — TOC Progression Note (Addendum)
Transition of Care [facility]) - Progression Note    Patient Details  Name: Jacob Bishop MRN: [id number] Date of Birth: 1940-09-05  Transition of Care [facility] CM/SW Contact  Ross Ludwig, [place] Phone Number: 08/19/2021, 6:25 PM  Clinical Narrative:     CSW spoke to patient's daughter Jacob Bishop to present bed offers.  Originally she said [facility] for short term rehab, however she changed her mind and does not want patient to go there.  CSW presented other bed offers, she will review choices.  CSW also sent updated clincals to SNFs waiting for other bed offers.  CSW also spoke to patient's daughter to see if she is interested in having LTAC screen patient, she said yes that would be fine.  CSW contacted [facility] and [facility] to have them review patient's information.  LTAC will contact TOC tomorrow if patient is eligible, and then TOC can present to attending physician.   Expected Discharge Plan: [place] Barriers to Discharge: No Barriers Identified  Expected Discharge Plan and Services Expected Discharge Plan: [place] arrangements for the past 2 months: Single Family Home                                       Social Determinants of Health (SDOH) Interventions    Readmission Risk Interventions No flowsheet data found.

## 2021-08-19 NOTE — NC FL2 (Signed)
[place] LEVEL OF CARE SCREENING TOOL     IDENTIFICATION  Patient Name: Jacob Bishop Birthdate: May 18, 1941 Sex: male Admission Date (Current Location): 07/24/2021  [facility] and [place] Number:  Herbalist and Address:  [facility],  [place], [place]      Provider Number: [id number]  Attending Physician Name and Address:  Eugenie Filler, MD  Relative Name and Phone Number:  [place] Relative [phone]  [place] [phone]    Current Level of Care: Hospital Recommended Level of Care: [place] Prior Approval Number:    Date Approved/Denied:   PASRR Number: [id number]  Discharge Plan: SNF    Current Diagnoses: Patient Active Problem List   Diagnosis Date Noted   Shortness of breath    Palliative care by specialist    Chest pain 07/27/2021   Depression 07/25/2021   Pressure injury of skin 07/25/2021   Acute pulmonary embolus ([place]) 07/24/2021   Acute on chronic respiratory failure with hypoxia ([place]) 06/30/2021   DM type 2 with diabetic mixed hyperlipidemia ([place]) 06/30/2021   Mass of lung 06/27/2021   Lesion of liver 06/27/2021   Chronic respiratory failure with hypoxia ([place]) 06/27/2021   OA (osteoarthritis) of knee 10/04/2018   Other secondary pulmonary hypertension ([place]) 04/06/2018   Osteoarthritis, hand 05/05/2014   Carpal tunnel syndrome 05/02/2014   Eczema 01/23/2014   Thrombocytopenia ([place]) 11/19/2012   Overactive bladder 08/24/2012   Osteoarthritis of right knee 01/22/2012   Benign positional vertigo 07/09/2011   CAD, NATIVE VESSEL 10/22/2009   EDEMA 08/03/2009   Obstructive sleep apnea 05/03/2009   SKIN LESION 09/23/2007   Diabetes mellitus type 2, uncontrolled 03/23/2007   Hyperlipidemia 03/23/2007   Essential hypertension 03/23/2007   COLONIC POLYPS, HX OF 03/23/2007   Morbid obesity ([place]) 03/23/2007    Orientation RESPIRATION BLADDER Height &  Weight     Self, Time, Situation, Place  O2 (5L of oxygen) Continent Weight: 236 lb 12.4 oz (107.4 kg) Height:  5\' 11"  (180.3 cm)  BEHAVIORAL SYMPTOMS/MOOD NEUROLOGICAL BOWEL NUTRITION STATUS      Continent Diet  AMBULATORY STATUS COMMUNICATION OF NEEDS Skin   Limited Assist Verbally Other (Comment), PU Stage and Appropriate Care (Buttocks Left and right Stage 2, Dressing changed prn)   PU Stage 2 Dressing:  (Every 3 days)                   Personal Care Assistance Level of Assistance  Bathing, Feeding, Dressing Bathing Assistance: Limited assistance Feeding assistance: Independent Dressing Assistance: Limited assistance     Functional Limitations Info  Sight, Hearing, Speech Sight Info: Adequate Hearing Info: Adequate Speech Info: Adequate    SPECIAL CARE FACTORS FREQUENCY  PT (By licensed PT), OT (By licensed OT)     PT Frequency: Minimum 5x a week OT Frequency: Minimum 5x a week            Contractures Contractures Info: Not present    Additional Factors Info  Allergies, Code Status, Psychotropic, Insulin Sliding Scale Code Status Info: DNR Allergies Info: Lipitor (Atorvastatin Calcium), Oxycodone Psychotropic Info: buPROPion (WELLBUTRIN XL) 24 hr tablet 150 mg Insulin Sliding Scale Info: insulin aspart (novoLOG) injection 3 Units 3x a day with meals       Current Medications (08/19/2021):  This is the current hospital active medication list Current Facility-Administered Medications  Medication Dose Route Frequency Provider Last Rate Last Admin   (feeding supplement) PROSource Plus liquid 30 mL  30 mL Oral Daily Elodia Florence., MD   30 mL at 08/19/21 3532   acetaminophen (TYLENOL) tablet 650 mg  650 mg Oral Q6H PRN Vianne Bulls, MD   650 mg at 08/11/21 1522   Or   acetaminophen (TYLENOL) suppository 650 mg  650 mg Rectal Q6H PRN Opyd, Ilene Qua, MD       albuterol (PROVENTIL) (2.5 MG/3ML) 0.083% nebulizer solution 2.5 mg  2.5 mg Nebulization Q4H  PRN Arrien, Lionardo Picket, MD       ALPRAZolam Duanne Moron) tablet 1 mg  1 mg Oral TID PRN Tawni Millers, MD   1 mg at 08/19/21 1657   apixaban (ELIQUIS) tablet 5 mg  5 mg Oral BID Suzzanne Cloud, RPH   5 mg at 08/19/21 0851   budesonide (PULMICORT) nebulizer solution 0.5 mg  0.5 mg Nebulization BID Amin, Ankit Chirag, MD   0.5 mg at 08/19/21 0827   buPROPion (WELLBUTRIN XL) 24 hr tablet 150 mg  150 mg Oral Daily Opyd, Ilene Qua, MD   150 mg at 08/19/21 0851   chlorhexidine (PERIDEX) 0.12 % solution 15 mL  15 mL Mouth Rinse BID Amin, Ankit Chirag, MD   15 mL at 08/19/21 0856   Chlorhexidine Gluconate Cloth 2 % PADS 6 each  6 each Topical Daily Arrien, Tareq Picket, MD   6 each at 08/19/21 0920   feeding supplement (ENSURE ENLIVE / ENSURE PLUS) liquid 237 mL  237 mL Oral Q24H Elodia Florence., MD   237 mL at 08/12/21 2221   guaiFENesin-dextromethorphan (ROBITUSSIN DM) 100-10 MG/5ML syrup 5 mL  5 mL Oral Q4H PRN Elodia Florence., MD   5 mL at 07/29/21 2043   hydrocortisone (CORTEF) tablet 25 mg  25 mg Oral BID Tawni Millers, MD   25 mg at 08/19/21 0851   insulin aspart (novoLOG) injection 0-15 Units  0-15 Units Subcutaneous TID WC Opyd, Ilene Qua, MD   3 Units at 08/19/21 1215   insulin aspart (novoLOG) injection 0-5 Units  0-5 Units Subcutaneous QHS Vianne Bulls, MD   2 Units at 08/15/21 2153   insulin aspart (novoLOG) injection 3 Units  3 Units Subcutaneous TID WC Opyd, Ilene Qua, MD   3 Units at 08/19/21 0900   insulin glargine-yfgn (SEMGLEE) injection 15 Units  15 Units Subcutaneous QHS Amin, Ankit Chirag, MD   15 Units at 08/18/21 2031   ipratropium-albuterol (DUONEB) 0.5-2.5 (3) MG/3ML nebulizer solution 3 mL  3 mL Inhalation BID Kathie Dike, MD   3 mL at 08/19/21 0827   lip balm (CARMEX) ointment   Topical PRN Elodia Florence., MD   Given at 08/14/21 [phone]   loratadine (CLARITIN) tablet 10 mg  10 mg Oral Daily Amin, Ankit Chirag, MD   10 mg at 08/19/21  0851   MEDLINE mouth rinse  15 mL Mouth Rinse q12n4p Amin, Ankit Chirag, MD   15 mL at 08/19/21 1709   metoprolol tartrate (LOPRESSOR) tablet 50 mg  50 mg Oral BID Lovey Newcomer T, NP   50 mg at 08/19/21 2683   multivitamin with minerals tablet 1 tablet  1 tablet Oral Daily Elodia Florence., MD   1 tablet at 08/19/21 0851   oxybutynin (DITROPAN-XL) 24 hr tablet 10 mg  10 mg Oral Daily Opyd, Ilene Qua, MD   10 mg at 08/19/21 0851   pantoprazole (PROTONIX) EC tablet 40 mg  40 mg Oral Daily Elodia Florence.,  MD   40 mg at 08/19/21 0850   polyethylene glycol (MIRALAX / GLYCOLAX) packet 17 g  17 g Oral Daily Tawni Millers, MD   17 g at 08/19/21 0848   simvastatin (ZOCOR) tablet 20 mg  20 mg Oral q1800 Opyd, Ilene Qua, MD   20 mg at 08/19/21 1657   sodium chloride flush (NS) 0.9 % injection 3 mL  3 mL Intravenous Q12H Opyd, Ilene Qua, MD   3 mL at 08/19/21 0902   tamsulosin (FLOMAX) capsule 0.4 mg  0.4 mg Oral BID Vianne Bulls, MD   0.4 mg at 08/19/21 1364     Discharge Medications: Please see discharge summary for a list of discharge medications.  Relevant Imaging Results:  Relevant Lab Results:   Additional Information SSN [id number]  Ross Ludwig, LCSW

## 2021-08-19 NOTE — Progress Notes (Signed)
Physical Therapy Treatment Patient Details Name: JAXSYN AZAM MRN: [id number] DOB: May 16, 1941 Today's Date: 08/19/2021   History of Present Illness 80 yo male admitted with acute PE, LE DVT, Afib with RVR. Hx of COVID, COPD-O2 dep at baseline    PT Comments    Received in bed, pleasant and cooperative; performed session on 5LPM per [place], however unable to always get accurate signal on pulse ox or dynamap, suspect SPO2 likely between 84-92% during session. Dizzy with positional changes but BP 115/63 today. Tolerated sitting at EOB for 7-8 minutes but needed up to [place] especially dynamically when performing ankle pumps, LAQs, seated hip ABD, and seated marches. Deferred standing today due to fatigue, had a rough night. Left in bed with all needs met, RN present and attending.     Recommendations for follow up therapy are one component of a multi-disciplinary discharge planning process, led by the attending physician.  Recommendations may be updated based on patient status, additional functional criteria and insurance authorization.  Follow Up Recommendations  Skilled nursing-short term rehab (<3 hours/day)     Assistance Recommended at Discharge Frequent or constant Supervision/Assistance  Equipment Recommendations  None recommended by PT    Recommendations for Other Services       Precautions / Restrictions Precautions Precautions: Fall Precaution Comments: "Bad" right knee Restrictions Weight Bearing Restrictions: No     Mobility  Bed Mobility Overal bed mobility: Needs Assistance Bed Mobility: Supine to Sit;Sit to Supine     Supine to sit: HOB elevated;Mod assist;+2 for physical assistance Sit to supine: Max assist;+2 for physical assistance;HOB elevated   General bed mobility comments: extra time/increased effort; had most difficulty with managing trunk when sitting up and BLEs when laying back down    Transfers                   General transfer comment:  deferred- fatigue    Ambulation/Gait               General Gait Details: deferred- fatigue   Stairs             Wheelchair Mobility    Modified Rankin (Stroke Patients Only)       Balance Overall balance assessment: Needs assistance Sitting-balance support: Feet supported;No upper extremity supported Sitting balance-Leahy Scale: Poor Sitting balance - Comments: R lateral lean today needing Min-ModA for trunk support especially dynamically       Standing balance comment: deferred                            Cognition Arousal/Alertness: Awake/alert Behavior During Therapy: WFL for tasks assessed/performed Overall Cognitive Status: Within Functional Limits for tasks assessed                                 General Comments: AxO x 3 very pleasant and willing but weak        Exercises      General Comments        Pertinent Vitals/Pain Pain Assessment: Faces Faces Pain Scale: Hurts little more Pain Location: BLEs generalized Pain Descriptors / Indicators: Discomfort;Sore Pain Intervention(s): Limited activity within patient's tolerance;Monitored during session    Home Living                          Prior Function  PT Goals (current goals can now be found in the care plan section) Acute Rehab PT Goals Patient Stated Goal: family is requesting rehab-pt seems agreeable PT Goal Formulation: With patient/family Time For Goal Achievement: 08/29/21 Potential to Achieve Goals: Good Progress towards PT goals: Not progressing toward goals - comment (limited by fatigue today)    Frequency    Min 2X/week      PT Plan Current plan remains appropriate    Co-evaluation              AM-PAC PT "6 Clicks" Mobility   Outcome Measure  Help needed turning from your back to your side while in a flat bed without using bedrails?: A Lot Help needed moving from lying on your back to sitting on the side  of a flat bed without using bedrails?: Total Help needed moving to and from a bed to a chair (including a wheelchair)?: Total Help needed standing up from a chair using your arms (e.g., wheelchair or bedside chair)?: Total Help needed to walk in hospital room?: Total Help needed climbing 3-5 steps with a railing? : Total 6 Click Score: 7    End of Session   Activity Tolerance: Patient tolerated treatment well;Patient limited by fatigue Patient left: in bed;with call bell/phone within reach;with bed alarm set;with nursing/sitter in room Nurse Communication: Mobility status;Other (comment) (vitals during session) PT Visit Diagnosis: Muscle weakness (generalized) (M62.81);Difficulty in walking, not elsewhere classified (R26.2)     Time: 1024-1040 PT Time Calculation (min) (ACUTE ONLY): 16 min  Charges:  $Therapeutic Activity: 8-22 mins                    Windell Norfolk, DPT, PN2   Supplemental Physical Therapist [place]    Pager [phone] Acute Rehab Office [phone]

## 2021-08-20 LAB — CBC WITH DIFFERENTIAL/PLATELET
Abs Immature Granulocytes: 0.13 10*3/uL — ABNORMAL HIGH (ref 0.00–0.07)
Basophils Absolute: 0 10*3/uL (ref 0.0–0.1)
Basophils Relative: 0 %
Eosinophils Absolute: 0 10*3/uL (ref 0.0–0.5)
Eosinophils Relative: 0 %
HCT: 33.1 % — ABNORMAL LOW (ref 39.0–52.0)
Hemoglobin: 11.1 g/dL — ABNORMAL LOW (ref 13.0–17.0)
Immature Granulocytes: 1 %
Lymphocytes Relative: 32 %
Lymphs Abs: 3.1 10*3/uL (ref 0.7–4.0)
MCH: 30.2 pg (ref 26.0–34.0)
MCHC: 33.5 g/dL (ref 30.0–36.0)
MCV: 90.2 fL (ref 80.0–100.0)
Monocytes Absolute: 0.8 10*3/uL (ref 0.1–1.0)
Monocytes Relative: 8 %
Neutro Abs: 5.4 10*3/uL (ref 1.7–7.7)
Neutrophils Relative %: 59 %
Platelets: 118 10*3/uL — ABNORMAL LOW (ref 150–400)
RBC: 3.67 MIL/uL — ABNORMAL LOW (ref 4.22–5.81)
RDW: 15.6 % — ABNORMAL HIGH (ref 11.5–15.5)
WBC: 9.4 10*3/uL (ref 4.0–10.5)
nRBC: 0 % (ref 0.0–0.2)

## 2021-08-20 LAB — GLUCOSE, CAPILLARY
Glucose-Capillary: 153 mg/dL — ABNORMAL HIGH (ref 70–99)
Glucose-Capillary: 186 mg/dL — ABNORMAL HIGH (ref 70–99)
Glucose-Capillary: 236 mg/dL — ABNORMAL HIGH (ref 70–99)
Glucose-Capillary: 166 mg/dL — ABNORMAL HIGH (ref 70–99)

## 2021-08-20 LAB — BASIC METABOLIC PANEL
Anion gap: 8 (ref 5–15)
BUN: 31 mg/dL — ABNORMAL HIGH (ref 8–23)
CO2: 32 mmol/L (ref 22–32)
Calcium: 7.7 mg/dL — ABNORMAL LOW (ref 8.9–10.3)
Chloride: 90 mmol/L — ABNORMAL LOW (ref 98–111)
Creatinine, Ser: 0.9 mg/dL (ref 0.61–1.24)
GFR, Estimated: >60 mL/min (ref >60)
Glucose, Bld: 158 mg/dL — ABNORMAL HIGH (ref 70–99)
Potassium: 4.4 mmol/L (ref 3.5–5.1)
Sodium: 130 mmol/L — ABNORMAL LOW (ref 135–145)

## 2021-08-20 MED ORDER — FUROSEMIDE 10 MG/ML IJ SOLN
20.0000 mg | Freq: Once | INTRAMUSCULAR | Status: AC
  Administered 2021-08-20: 23:00:00 20 mg via INTRAVENOUS
  Filled 2021-08-20: qty 2

## 2021-08-20 MED ORDER — ALBUMIN HUMAN 25 % IV SOLN
25.0000 g | Freq: Four times a day (QID) | INTRAVENOUS | Status: DC
  Administered 2021-08-20: 14:00:00 25 g via INTRAVENOUS
  Filled 2021-08-20 (×2): qty 100

## 2021-08-20 MED ORDER — FUROSEMIDE 10 MG/ML IJ SOLN
40.0000 mg | Freq: Once | INTRAMUSCULAR | Status: AC
  Administered 2021-08-20: 16:00:00 40 mg via INTRAVENOUS
  Filled 2021-08-20: qty 4

## 2021-08-20 MED ORDER — ALBUMIN HUMAN 25 % IV SOLN
25.0000 g | Freq: Four times a day (QID) | INTRAVENOUS | Status: AC
  Administered 2021-08-20 – 2021-08-21 (×2): 25 g via INTRAVENOUS
  Filled 2021-08-20 (×3): qty 100

## 2021-08-20 MED ORDER — ENSURE ENLIVE PO LIQD
237.0000 mL | Freq: Two times a day (BID) | ORAL | Status: DC
  Administered 2021-08-20 – 2021-08-25 (×9): 237 mL via ORAL

## 2021-08-20 NOTE — Progress Notes (Signed)
PROGRESS NOTE    Jacob Bishop  OVF:[id number] DOB: 1941-08-16 DOA: 07/24/2021 PCP: [name], NP   Chief Complaint  Patient presents with   Shortness of Breath    Brief Narrative:  Jacob Bishop was admitted to the hospital with the working diagnosis of acute hypoxemic respiratory failure, in the setting of acute pulmonary embolism.    80 year old male past medical history for coronary artery disease, type 2 diabetes mellitus, and obstructive sleep apnea. He also has lung nodules and liver lesions.  Recent hospitalization 11/5-11/03/2021 for acute hypoxic respiratory failure due to SARS COVID-19 viral pneumonia.  By time of his discharge he was still very weak and deconditioned, but not until a few days prior to his rehospitalization he developed worsening dyspnea that prompted him to come back to the hospital.  On his initial physical examination his oximetry was in the low 90s on 5 L supplemental oxygen per nasal cannula, respiratory rate 26-31, heart rate 103, blood pressure 128/64, his lungs had no wheezing, heart S1-S2, present, rhythmic, abdomen soft nontender, positive right lower extremity edema.   Sodium 135, potassium 3.6, chloride 93, bicarb 32, glucose 143, BUN 12, creatinine 0.89, white count 7.0, hemoglobin 13.2, hematocrit 39.7, platelets 118. SARS COVID-19 negative.   Chest radiograph with hyperinflation.   CT chest with small segmental arterial embolus in the right upper lobe.  Multiple mildly enlarged mediastinal and hilar lymph nodes.  Numerous bilateral lung masses with several small new nodules without cavitation and with interval enlargement of pre-existing nodules.   EKG 85 bpm, normal axis, normal intervals, sinus rhythm, no significant ST segment changes, negative T wave lead III-aVF.   Patient was placed on heparin for anticoagulation. Unable to do biopsy due to respiratory failure. Was placed on systemic corticosteroids along with bronchodilators.   12/12  patient had worsening respiratory failure, transferred to stepdown unit for noninvasive mechanical ventilation. Patient with metastatic lesions, palliative care was consulted.   CODE STATUS changed to DNR/DNI.   12/16 patient developed atrial fibrillation with RVR, placed on metoprolol with good response.    Pending transfer to SNF.    Patient continue to be very weak and deconditioned Using Bipap as needed.    Underwent diuresis with no much improvement in oxygenation.   Tapering steroids, but he developed hypotension, possible adrenal insufficiency, now started on IV hydrocortisone.      Assessment & Plan:   Principal Problem:   Acute pulmonary embolus (HCC) Active Problems:   Obstructive sleep apnea   Essential hypertension   CAD, NATIVE VESSEL   Thrombocytopenia (HCC)   Mass of lung   Liver lesion, left lobe   Acute on chronic respiratory failure with hypoxia (HCC)   DM type 2 with diabetic mixed hyperlipidemia (HCC)   Depression   Pressure injury of skin   Chest pain   Palliative care by specialist   Shortness of breath   Single subsegmental pulmonary embolism without acute cor pulmonale (HCC)   Acute deep vein thrombosis (DVT) of distal vein of right lower extremity ([place])  #1 acute on chronic hypoxemic respiratory failure, post SARS COVID-19 viral pneumonia, acute PE, bilateral pulmonary nodules worsening, right lower extremity DVT -Patient deemed not stable for biopsy but malignancy in differential due to concern for metastatic disease. -Due to patient's respiratory status unable to perform further pulmonary diagnostics for lung nodules and possible underlying malignancy. -Patient with ongoing dyspnea on 5 L nasal cannula. -Continue supplemental O2 to keep sats 88% or greater, BiPAP as  needed dyspnea and at bedtime for sleeping. -Placed on IV albumin every 6 hours x24 hours, give Lasix 40 mg IV x1 and monitor urine output. -Continue bronchodilators,  Claritin, -Patient very deconditioned and will need transfer to SNF versus LTAC for further rehab.  2.  New onset A. fib/acute on chronic diastolic CHF -Continue metoprolol for rate control, Eliquis for anticoagulation. -Diuretics on hold as blood pressure has been borderline.  -Patient with diffuse coarse breath sounds/crackles noted on examination.  -Placed on albumin every 6 hours x1 day.  Lasix 40 mg IV x1.  -Monitor urine output.  - Follow.  3.  Type 2 diabetes mellitus/dyslipidemia -Hemoglobin A1c 6.2 (07/01/2021). -CBG 153 this morning. -Continue Semglee, SSI.  4.  OSA/obesity class I -Weight loss. -Outpatient follow-up with PCP.  5.  Thrombocytopenia/anemia of chronic disease -Stable.  Platelet count at 118, hemoglobin stable at 11.1. -Monitor on anticoagulation.  6.  Hyponatremia -Resolved.  7.  Hepatic cirrhosis with 4.7 low-density lesion in the caudate lobe -Concern for hemangioma -Outpatient follow-up.  8.  Anxiety -Stable on Wellbutrin and alprazolam as needed.   9.  New adrenal insufficiency -Blood pressure improved with hydrocortisone patient noted to have been on steroids during his prolonged hospitalization. -Was on hydrocortisone 50 mg twice daily x3 days, currently on 25 mg twice daily and will taper down to hydrocortisone 15 mg every morning and 10 mg every afternoon hopefully in the next 24 to 48 hours.  10.  Stage II bilateral buttocks pressure ulcer, POA -Continue current wound care. Pressure Injury 07/25/21 Buttocks Right Stage 2 -  Partial thickness loss of dermis presenting as a shallow open injury with a red, pink wound bed without slough. (Active)  07/25/21 1530  Location: Buttocks  Location Orientation: Right  Staging: Stage 2 -  Partial thickness loss of dermis presenting as a shallow open injury with a red, pink wound bed without slough.  Wound Description (Comments):   Present on Admission: Yes     Pressure Injury 07/25/21 Buttocks Left  Stage 2 -  Partial thickness loss of dermis presenting as a shallow open injury with a red, pink wound bed without slough. (Active)  07/25/21 1531  Location: Buttocks  Location Orientation: Left  Staging: Stage 2 -  Partial thickness loss of dermis presenting as a shallow open injury with a red, pink wound bed without slough.  Wound Description (Comments):   Present on Admission: Yes     Pressure Injury 08/06/21 Sacrum Medial Deep Tissue Pressure Injury - Purple or maroon localized area of discolored intact skin or blood-filled blister due to damage of underlying soft tissue from pressure and/or shear. (Active)  08/06/21 1600  Location: Sacrum  Location Orientation: Medial  Staging: Deep Tissue Pressure Injury - Purple or maroon localized area of discolored intact skin or blood-filled blister due to damage of underlying soft tissue from pressure and/or shear.  Wound Description (Comments):   Present on Admission: No         DVT prophylaxis: Eliquis Code Status: DNR Family Communication: Updated patient.  Updated wife at bedside.   Disposition:   Status is: Inpatient  Remains inpatient appropriate because: Severity of illness       Consultants:  PCCM: Dr. Lake Bells 07/29/2021 Palliative care: Dr. Rowe Pavy 08/01/2021  Procedures:  CT angiogram chest 07/24/2021 Chest x-ray 08/14/2021, 08/03/2021, 07/31/2021, 08/23/2021 Abdominal ultrasound 07/29/2021 2D echo 07/25/2021 Lower extremity Dopplers 07/25/2021  Antimicrobials:  Anti-infectives (From admission, onward)    None  Subjective: Sitting up in bed.  States shortness of breath is improving.  Denies any chest pain.  No abdominal pain.  Wife at bedside.   Objective: Vitals:   08/20/21 0335 08/20/21 0416 08/20/21 0808 08/20/21 0856  BP:  (!) 90/52 (!) 99/57   Pulse: 82 82 83   Resp: 16 20 20    Temp:  97.7 F (36.5 C) 97.8 F (36.6 C)   TempSrc:  Axillary    SpO2:  93% 95% 97%  Weight:      Height:         Intake/Output Summary (Last 24 hours) at 08/20/2021 1211 Last data filed at 08/20/2021 0207 Gross per 24 hour  Intake 480 ml  Output 750 ml  Net -270 ml    Filed Weights   07/28/21 0414 08/03/21 1625 08/19/21 0423  Weight: 110 kg 106.2 kg 107.4 kg    Examination:  General exam: NAD. Respiratory system: Diffuse crackles/coarse breath sounds.  Some shallow breaths.  Fair air movement.  Shallow breaths. Cardiovascular system: Regular rate and rhythm no murmurs rubs or gallops.  No JVD.  No lower extremity edema.  Gastrointestinal system: Abdomen is soft, nontender, nondistended, positive bowel sounds.  No rebound.  No guarding.  Central nervous system: Alert and oriented. No focal neurological deficits. Extremities: Symmetric 5 x 5 power. Skin: No rashes, lesions or ulcers Psychiatry: Judgement and insight appear normal. Mood & affect appropriate.     Data Reviewed: I have personally reviewed following labs and imaging studies  CBC: Recent Labs  Lab 08/16/21 0428 08/19/21 0350 08/20/21 0350  WBC 7.0 8.2 9.4  NEUTROABS  --   --  5.4  HGB 11.0* 11.1* 11.1*  HCT 33.3* 33.6* 33.1*  MCV 92.0 91.6 90.2  PLT 90* 120* 118*     Basic Metabolic Panel: Recent Labs  Lab 08/14/21 0430 08/16/21 0428 08/19/21 0350 08/20/21 0350  NA 136 136 134* 130*  K 3.7 3.6 4.4 4.4  CL 93* 92* 92* 90*  CO2 36* 32 34* 32  GLUCOSE 110* 143* 147* 158*  BUN 32* 30* 30* 31*  CREATININE 0.92 0.84 0.81 0.90  CALCIUM 8.0* 8.1* 7.8* 7.7*     GFR: Estimated Creatinine Clearance: 84.3 mL/min (by C-G formula based on SCr of 0.9 mg/dL).  Liver Function Tests: No results for input(s): AST, ALT, ALKPHOS, BILITOT, PROT, ALBUMIN in the last 168 hours.  CBG: Recent Labs  Lab 08/19/21 1130 08/19/21 1621 08/19/21 1947 08/20/21 0715 08/20/21 1114  GLUCAP 150* 158* 163* 153* 186*      Recent Results (from the past 240 hour(s))  Urine Culture     Status: Abnormal   Collection Time:  08/12/21  4:07 AM   Specimen: In/Out Cath Urine  Result Value Ref Range Status   Specimen Description   Final    IN/OUT CATH URINE Performed at [facility], [place] [address], [place], [place] [address]    Special Requests   Final    NONE Performed at [facility], [place] [address], [place], [place] [address]    Culture >=100,000 COLONIES/mL ENTEROCOCCUS FAECALIS (A)  Final   Report Status 08/14/2021 FINAL  Final   Organism ID, Bacteria ENTEROCOCCUS FAECALIS (A)  Final      Susceptibility   Enterococcus faecalis - MIC*    AMPICILLIN <=2 SENSITIVE Sensitive     NITROFURANTOIN <=16 SENSITIVE Sensitive     VANCOMYCIN 1 SENSITIVE Sensitive     * >=100,000 COLONIES/mL ENTEROCOCCUS FAECALIS  Radiology Studies: No results found.      Scheduled Meds:  (feeding supplement) PROSource Plus  30 mL Oral Daily   apixaban  5 mg Oral BID   budesonide (PULMICORT) nebulizer solution  0.5 mg Nebulization BID   buPROPion  150 mg Oral Daily   chlorhexidine  15 mL Mouth Rinse BID   Chlorhexidine Gluconate Cloth  6 each Topical Daily   feeding supplement  237 mL Oral BID BM   hydrocortisone  25 mg Oral BID   insulin aspart  0-15 Units Subcutaneous TID WC   insulin aspart  0-5 Units Subcutaneous QHS   insulin aspart  3 Units Subcutaneous TID WC   insulin glargine-yfgn  15 Units Subcutaneous QHS   ipratropium-albuterol  3 mL Inhalation BID   loratadine  10 mg Oral Daily   mouth rinse  15 mL Mouth Rinse q12n4p   metoprolol tartrate  50 mg Oral BID   multivitamin with minerals  1 tablet Oral Daily   oxybutynin  10 mg Oral Daily   pantoprazole  40 mg Oral Daily   polyethylene glycol  17 g Oral Daily   simvastatin  20 mg Oral q1800   sodium chloride flush  3 mL Intravenous Q12H   tamsulosin  0.4 mg Oral BID   Continuous Infusions:   LOS: 27 days    Time spent: 40 minutes    Irine Seal, MD [facility]   To contact the  attending provider between 7A-7P or the covering provider during after hours 7P-7A, please log into the web site [email or web address] and access using universal [place] password for that web site. If you do not have the password, please call the hospital operator.  08/20/2021, 12:11 PM

## 2021-08-20 NOTE — Progress Notes (Signed)
Attempted to get patient OOB to chair. Patient needed moderate assist to sit on side of bed. Patient unable to stand. Patient repositioned in bed into chair position.

## 2021-08-20 NOTE — Plan of Care (Signed)

## 2021-08-20 NOTE — Progress Notes (Signed)
Nutrition Follow-up  DOCUMENTATION CODES:  Obesity unspecified  INTERVENTION:  Increase Ensure from daily to BID.  Continue ProSource Plus daily.  Continue MVI with minerals daily.  Encourage PO and supplement intake.  NUTRITION DIAGNOSIS:  Increased nutrient needs related to acute illness, chronic illness as evidenced by estimated needs. - ongoing  GOAL:  Patient will meet greater than or equal to 90% of their needs. - not meeting consistently  MONITOR:  PO intake, Supplement acceptance, Labs, Weight trends  REASON FOR ASSESSMENT:  Malnutrition Screening Tool    ASSESSMENT:  80 y.o. male with medical history of CAD, insulin-dependent DM, OSA on CPAP, on 2L O2 since recent COVID infection, lung nodules and liver lesion being worked up outpatient, HTN, CAD, HLD, arthritis, fatty liver, diverticulosis, aortic insufficiency, and tricuspid regurgitation. Patient presented to the ED with increased shortness of breath and increased O2 requirement. He had been discharged from the hospital on 11/8.  Spoke with pt at bedside. Pt reports that he has been loving all of his meals.  Per Epic, pt with a varied intake documentation. On average, eating 38% of the last 8 documented meals (0-90%).  RN also at bedside distributing medication. RN reports that pt did not eat breakfast or lunch yesterday, but "went to town" on his dinner and drank a Strawberry Ensure yesterday.  Admit wt: 110 kg Current wt: 107.4 kg  Of note, pt with moderate BLE and mild sacral edmea.  Given varied PO intake, RD to increase Ensure from daily to BID to aid in intake.  Supplements: Ensure daily, ProSource Plus daily  Medications: reviewed; SSI, Semglee, MVI with minerals, Protonix, miralax, Xanax PRN (given twice today)  Labs: reviewed; Na 130 (L), CBG 131-163 (H) HbA1c: 6.2% (07/01/2021)  Diet Order:   Diet Order             Diet Carb Modified Fluid consistency: Thin; Room service appropriate? Yes with  Assist  Diet effective now                  EDUCATION NEEDS:  Not appropriate for education at this time  Skin:  Skin Assessment: Skin Integrity Issues: Skin Integrity Issues:: Stage II, DTI, Other (Comment) DTI: Sacrum Stage II: Bilateral buttocks Other: Skin tear on L elbow  Last BM:  08/18/21 - Type 5, large  Height:  Ht Readings from Last 1 Encounters:  08/03/21 5\' 11"  (1.803 m)   Weight:  Wt Readings from Last 1 Encounters:  08/19/21 107.4 kg   BMI:  Body mass index is 33.02 kg/m.  Estimated Nutritional Needs:  Kcal:  2000-2200 kcal Protein:  105-125 grams Fluid:  >/= 1.7 L/day  Derrel Nip, RD, LDN (she/her/hers) Clinical Inpatient Dietitian RD Pager/After-Hours/Weekend Pager # in [place]

## 2021-08-21 ENCOUNTER — Inpatient Hospital Stay (HOSPITAL_COMMUNITY): Payer: Medicare Other

## 2021-08-21 LAB — BLOOD GAS, ARTERIAL
Acid-Base Excess: 11.1 mmol/L — ABNORMAL HIGH (ref 0.0–2.0)
Bicarbonate: 38 mmol/L — ABNORMAL HIGH (ref 20.0–28.0)
Drawn by: 560031
FIO2: 80
O2 Content: 15 L/min
O2 Saturation: 95.2 %
Patient temperature: 98.6
pCO2 arterial: 66.2 mmHg (ref 32.0–48.0)
pH, Arterial: 7.377 (ref 7.350–7.450)
pO2, Arterial: 77.5 mmHg — ABNORMAL LOW (ref 83.0–108.0)

## 2021-08-21 LAB — D-DIMER, QUANTITATIVE: D-Dimer, Quant: 1.25 ug/mL-FEU — ABNORMAL HIGH (ref 0.00–0.50)

## 2021-08-21 LAB — BASIC METABOLIC PANEL
Anion gap: 8 (ref 5–15)
BUN: 33 mg/dL — ABNORMAL HIGH (ref 8–23)
CO2: 35 mmol/L — ABNORMAL HIGH (ref 22–32)
Calcium: 8 mg/dL — ABNORMAL LOW (ref 8.9–10.3)
Chloride: 91 mmol/L — ABNORMAL LOW (ref 98–111)
Creatinine, Ser: 0.88 mg/dL (ref 0.61–1.24)
GFR, Estimated: >60 mL/min (ref >60)
Glucose, Bld: 131 mg/dL — ABNORMAL HIGH (ref 70–99)
Potassium: 3.8 mmol/L (ref 3.5–5.1)
Sodium: 134 mmol/L — ABNORMAL LOW (ref 135–145)

## 2021-08-21 LAB — CBC
HCT: 27.7 % — ABNORMAL LOW (ref 39.0–52.0)
Hemoglobin: 9.3 g/dL — ABNORMAL LOW (ref 13.0–17.0)
MCH: 30.6 pg (ref 26.0–34.0)
MCHC: 33.6 g/dL (ref 30.0–36.0)
MCV: 91.1 fL (ref 80.0–100.0)
Platelets: 106 10*3/uL — ABNORMAL LOW (ref 150–400)
RBC: 3.04 MIL/uL — ABNORMAL LOW (ref 4.22–5.81)
RDW: 15.6 % — ABNORMAL HIGH (ref 11.5–15.5)
WBC: 6 10*3/uL (ref 4.0–10.5)
nRBC: 0 % (ref 0.0–0.2)

## 2021-08-21 LAB — BRAIN NATRIURETIC PEPTIDE: B Natriuretic Peptide: 68 pg/mL (ref 0.0–100.0)

## 2021-08-21 LAB — GLUCOSE, CAPILLARY
Glucose-Capillary: 122 mg/dL — ABNORMAL HIGH (ref 70–99)
Glucose-Capillary: 146 mg/dL — ABNORMAL HIGH (ref 70–99)
Glucose-Capillary: 244 mg/dL — ABNORMAL HIGH (ref 70–99)
Glucose-Capillary: 202 mg/dL — ABNORMAL HIGH (ref 70–99)

## 2021-08-21 LAB — PROCALCITONIN: Procalcitonin: <0.1 ng/mL

## 2021-08-21 MED ORDER — FUROSEMIDE 10 MG/ML IJ SOLN
20.0000 mg | Freq: Once | INTRAMUSCULAR | Status: AC
  Administered 2021-08-21: 14:00:00 20 mg via INTRAVENOUS
  Filled 2021-08-21: qty 2

## 2021-08-21 MED ORDER — LEVALBUTEROL HCL 0.63 MG/3ML IN NEBU: 0.6300 mg | INHALATION_SOLUTION | Freq: Four times a day (QID) | RESPIRATORY_TRACT | Status: DC | PRN

## 2021-08-21 NOTE — Plan of Care (Signed)

## 2021-08-21 NOTE — Progress Notes (Signed)
Patient requesting to come off BIPAP.  Patient placed on 4L [place].  His oxygen saturation was 84%.  Increased O2 to 6L.  Oxygen saturation increased to 88-90%.  Gershon Cull, NP notified of increased oxygen demands and change in respiratory assessment, which includes severely diminished/absent breath sounds to the LLL.  Patient requested to go back on BIPAP within 15 minutes of being on [place].

## 2021-08-21 NOTE — Progress Notes (Signed)
OT Cancellation Note  Patient Details Name: Jacob Bishop MRN: [id number] DOB: 12-21-1940   Cancelled Treatment:    Reason Eval/Treat Not Completed: Fatigue/lethargy limiting ability to participate. Patient requiring increased levels of oxygen - 15 L HFNC and politely reclines therapy today due to fatigue. Will f/u as able.  Abbegail Matuska L Keslyn Teater 08/21/2021, 8:59 AM

## 2021-08-21 NOTE — Progress Notes (Signed)
Gershon Cull, NP notified of patient's soft BP (106/59).  Albumin, furosemide and metoprolol due.  Requested to hold metoprolol until after albumin and furosemide had been completed d/t concerns for hypotension.  NP agreeable with plan.  Notified that metoprolol was held at 0120 d/t SBP < 100.

## 2021-08-21 NOTE — Progress Notes (Signed)
PROGRESS NOTE    Jacob Bishop  ZTI:[id number] DOB: 1941-01-14 DOA: 07/24/2021 PCP: [name], NP   Chief Complaint  Patient presents with   Shortness of Breath    Brief Narrative:  Mr. Krammes was admitted to the hospital with the working diagnosis of acute hypoxemic respiratory failure, in the setting of multiple disused pulmonary nodules, masses complicated by acute pulmonary embolism and recent COVID-19 viral pneumonia.    80 year old male past medical history for coronary artery disease, type 2 diabetes mellitus, and obstructive sleep apnea. He also has multiple lung nodules and liver lesions concerning for metastatic disease.  Recent hospitalization 11/5-11/03/2021 for acute hypoxic respiratory failure due to SARS COVID-19 viral pneumonia.  He presented on 07/24/2021 with dyspnea was admitted by the hospitalist service and found to have segmental, acute PE.  CT chest revealed multiple pulmonary nodules throughout the lungs and lesions in the liver.  His respiratory status has been progressively worsening, requiring as needed BiPAP.  Currently on high flow nasal cannula 15 L.   12/12 patient had worsening respiratory failure, transferred to stepdown unit for noninvasive mechanical ventilation. Patient with metastatic lesions, palliative care was consulted.  CODE STATUS changed to DNR/DNI.   12/16 patient developed atrial fibrillation with RVR, placed on metoprolol with good response.   Patient continue to be very weak and deconditioned Using Bipap as needed.    Underwent diuresis with no much improvement in oxygenation.   Tapering steroids, but he developed hypotension, possible adrenal insufficiency, now started on IV hydrocortisone.  On p.o. Cortef 25 mg twice daily since 08/18/2021.  08/21/2021: Seen and examined at bedside.  On BiPAP, weak appearing.  ABG done this morning pH 7.377/66.2/77.5 while on high flow nasal cannula 15 L.  Repeat chest x-ray showing multiple pulmonary  nodules and small bilateral pleural effusions versus atelectasis.  1 dose IV Lasix given 20 mg x 1.     Assessment & Plan:   Principal Problem:   Acute pulmonary embolus (HCC) Active Problems:   Obstructive sleep apnea   Essential hypertension   CAD, NATIVE VESSEL   Thrombocytopenia (HCC)   Mass of lung   Liver lesion, left lobe   Acute on chronic respiratory failure with hypoxia (HCC)   DM type 2 with diabetic mixed hyperlipidemia (HCC)   Depression   Pressure injury of skin   Chest pain   Palliative care by specialist   Shortness of breath   Single subsegmental pulmonary embolism without acute cor pulmonale (HCC)   Acute deep vein thrombosis (DVT) of distal vein of right lower extremity (HCC)  #1 acute on chronic hypoxemic hypercarbic respiratory failure, secondary to multiple pulmonary nodules/masses, complicated by recent SARS COVID-19 viral pneumonia, acute PE -Patient deemed not stable for biopsy, seen by PCCM Dr. Valeta Harms, but malignancy in differential with concern for metastatic disease to the liver. -Due to patient's respiratory status unable to perform further pulmonary diagnostics for lung nodules and possible underlying malignancy. Worsening respiratory status, currently requiring 15 L high flow nasal cannula and as needed BiPAP to maintain a saturation greater than 90%. Personally reviewed ABG done this morning pH 7.377/66.2/77.5 while on high flow nasal cannula 15 L. Personally reviewed chest x-ray done on 08/13/2021 which showed multiple pulmonary nodules and small bilateral pleural effusions versus atelectasis.  1 dose IV Lasix given 20 mg x 1.  2.  New onset A. fib/acute on chronic diastolic CHF Continue metoprolol for rate control, Eliquis for anticoagulation. -Patient with diffuse coarse breath sounds/crackles noted  on examination.  Closely monitor on telemetry.  3.  Type 2 diabetes mellitus/dyslipidemia -Hemoglobin A1c 6.2 (07/01/2021). -CBG 153 this  morning. -Continue Semglee, SSI.  4.  OSA/obesity class I BMI 33. -Outpatient follow-up with PCP.  5.  Thrombocytopenia/worsening anemia of chronic disease Hemoglobin dropped 9.3 from 11.1. Platelet 106 from 115. Continue to monitor  6.  Improving hyponatremia -Serum sodium 134 from 130.  7.  Hepatic lesion in the left lobe of the liver/splenomegaly seen on CT scan.  8.  Chronic anxiety On Wellbutrin prior to admission. Hold off benzodiazepine and narcotics to avoid worsening of CO2 retention.  9.  New adrenal insufficiency -Blood pressure improved with hydrocortisone patient noted to have been on steroids during his prolonged hospitalization. -Was on hydrocortisone 50 mg twice daily x3 days, currently on 25 mg twice daily and taper down to hydrocortisone 15 mg every morning and 10 mg every afternoon hopefully in the next 24 to 48 hours.  10.  Stage II bilateral buttocks pressure ulcer, POA -Continue current wound care. Pressure Injury 07/25/21 Buttocks Right Stage 2 -  Partial thickness loss of dermis presenting as a shallow open injury with a red, pink wound bed without slough. (Active)  07/25/21 1530  Location: Buttocks  Location Orientation: Right  Staging: Stage 2 -  Partial thickness loss of dermis presenting as a shallow open injury with a red, pink wound bed without slough.  Wound Description (Comments):   Present on Admission: Yes     Pressure Injury 07/25/21 Buttocks Left Stage 2 -  Partial thickness loss of dermis presenting as a shallow open injury with a red, pink wound bed without slough. (Active)  07/25/21 1531  Location: Buttocks  Location Orientation: Left  Staging: Stage 2 -  Partial thickness loss of dermis presenting as a shallow open injury with a red, pink wound bed without slough.  Wound Description (Comments):   Present on Admission: Yes     Pressure Injury 08/06/21 Sacrum Medial Deep Tissue Pressure Injury - Purple or maroon localized area of  discolored intact skin or blood-filled blister due to damage of underlying soft tissue from pressure and/or shear. (Active)  08/06/21 1600  Location: Sacrum  Location Orientation: Medial  Staging: Deep Tissue Pressure Injury - Purple or maroon localized area of discolored intact skin or blood-filled blister due to damage of underlying soft tissue from pressure and/or shear.  Wound Description (Comments):   Present on Admission: No    Goals of care DNR Palliative care team following Patient has a very poor prognosis with worsening respiratory failure with concern for multiple lung nodules, liver lesions.     DVT prophylaxis: Eliquis Code Status: DNR Family Communication: Updated his stepdaughter at bedside.    Disposition: Very poor prognosis  Status is: Inpatient  Remains inpatient appropriate because: Severity of illness       Consultants:  PCCM: Dr. Lake Bells 07/29/2021 Palliative care: Dr. Rowe Pavy 08/01/2021  Procedures:  CT angiogram chest 07/24/2021 Chest x-ray 08/14/2021, 08/03/2021, 07/31/2021, 08/23/2021 Abdominal ultrasound 07/29/2021 2D echo 07/25/2021 Lower extremity Dopplers 07/25/2021  Antimicrobials:  Anti-infectives (From admission, onward)    None          Objective: Vitals:   08/21/21 0547 08/21/21 0743 08/21/21 0745 08/21/21 1000  BP: (!) 99/55   100/80  Pulse: 77 77  78  Resp: 18 (!) 23    Temp: 99.3 F (37.4 C)   98.3 F (36.8 C)  TempSrc: Oral   Oral  SpO2: 92% 91% 91% 91%  Weight:      Height:        Intake/Output Summary (Last 24 hours) at 08/21/2021 1250 Last data filed at 08/21/2021 0400 Gross per 24 hour  Intake 378.8 ml  Output 1375 ml  Net -996.2 ml   Filed Weights   07/28/21 0414 08/03/21 1625 08/19/21 0423  Weight: 110 kg 106.2 kg 107.4 kg    Examination:  General exam: Frail-appearing in no acute distress.  He is on BiPAP.   Respiratory system: Diffuse rales bilaterally.  Poor inspiratory effort.   Cardiovascular  system: Regular rate and rhythm no rubs or gallops.   Gastrointestinal system: Soft nontender normal bowel sounds present.   Central nervous system: Alert moves all 4 extremities.   Extremities: No lower extremity edema bilaterally.   Skin: Pressure wounds as stated above. Psychiatry: Mood is appropriate for condition and setting.    Data Reviewed: I have personally reviewed following labs and imaging studies  CBC: Recent Labs  Lab 08/16/21 0428 08/19/21 0350 08/20/21 0350 08/21/21 0354  WBC 7.0 8.2 9.4 6.0  NEUTROABS  --   --  5.4  --   HGB 11.0* 11.1* 11.1* 9.3*  HCT 33.3* 33.6* 33.1* 27.7*  MCV 92.0 91.6 90.2 91.1  PLT 90* 120* 118* 106*    Basic Metabolic Panel: Recent Labs  Lab 08/16/21 0428 08/19/21 0350 08/20/21 0350 08/21/21 0354  NA 136 134* 130* 134*  K 3.6 4.4 4.4 3.8  CL 92* 92* 90* 91*  CO2 32 34* 32 35*  GLUCOSE 143* 147* 158* 131*  BUN 30* 30* 31* 33*  CREATININE 0.84 0.81 0.90 0.88  CALCIUM 8.1* 7.8* 7.7* 8.0*    GFR: Estimated Creatinine Clearance: 86.2 mL/min (by C-G formula based on SCr of 0.88 mg/dL).  Liver Function Tests: No results for input(s): AST, ALT, ALKPHOS, BILITOT, PROT, ALBUMIN in the last 168 hours.  CBG: Recent Labs  Lab 08/20/21 1114 08/20/21 1647 08/20/21 1950 08/21/21 0825 08/21/21 1135  GLUCAP 186* 236* 166* 122* 146*     Recent Results (from the past 240 hour(s))  Urine Culture     Status: Abnormal   Collection Time: 08/12/21  4:07 AM   Specimen: In/Out Cath Urine  Result Value Ref Range Status   Specimen Description   Final    IN/OUT CATH URINE Performed at [facility] [address], [place], [place] [address]    Special Requests   Final    NONE Performed at [facility], [place] [address], [place], [place] [address]    Culture >=100,000 COLONIES/mL ENTEROCOCCUS FAECALIS (A)  Final   Report Status 08/14/2021 FINAL  Final   Organism ID, Bacteria ENTEROCOCCUS  FAECALIS (A)  Final      Susceptibility   Enterococcus faecalis - MIC*    AMPICILLIN <=2 SENSITIVE Sensitive     NITROFURANTOIN <=16 SENSITIVE Sensitive     VANCOMYCIN 1 SENSITIVE Sensitive     * >=100,000 COLONIES/mL ENTEROCOCCUS FAECALIS         Radiology Studies: No results found.      Scheduled Meds:  (feeding supplement) PROSource Plus  30 mL Oral Daily   apixaban  5 mg Oral BID   budesonide (PULMICORT) nebulizer solution  0.5 mg Nebulization BID   buPROPion  150 mg Oral Daily   chlorhexidine  15 mL Mouth Rinse BID   Chlorhexidine Gluconate Cloth  6 each Topical Daily   feeding supplement  237 mL Oral BID BM   furosemide  20 mg Intravenous Once   hydrocortisone  25 mg Oral BID   insulin aspart  0-15 Units Subcutaneous TID WC   insulin aspart  0-5 Units Subcutaneous QHS   insulin aspart  3 Units Subcutaneous TID WC   insulin glargine-yfgn  15 Units Subcutaneous QHS   ipratropium-albuterol  3 mL Inhalation BID   loratadine  10 mg Oral Daily   mouth rinse  15 mL Mouth Rinse q12n4p   metoprolol tartrate  50 mg Oral BID   multivitamin with minerals  1 tablet Oral Daily   oxybutynin  10 mg Oral Daily   pantoprazole  40 mg Oral Daily   polyethylene glycol  17 g Oral Daily   simvastatin  20 mg Oral q1800   sodium chloride flush  3 mL Intravenous Q12H   tamsulosin  0.4 mg Oral BID   Continuous Infusions:  albumin human 25 g (08/21/21 0208)     LOS: 28 days    Time spent: 40 minutes    Kayleen Memos, MD [facility]   To contact the attending provider between 7A-7P or the covering provider during after hours 7P-7A, please log into the web site [email or web address] and access using universal [place] password for that web site. If you do not have the password, please call the hospital operator.  08/21/2021, 12:50 PM

## 2021-08-22 LAB — GLUCOSE, CAPILLARY
Glucose-Capillary: 130 mg/dL — ABNORMAL HIGH (ref 70–99)
Glucose-Capillary: 169 mg/dL — ABNORMAL HIGH (ref 70–99)
Glucose-Capillary: 201 mg/dL — ABNORMAL HIGH (ref 70–99)
Glucose-Capillary: 140 mg/dL — ABNORMAL HIGH (ref 70–99)

## 2021-08-22 MED ORDER — MIDODRINE HCL 5 MG PO TABS
10.0000 mg | ORAL_TABLET | Freq: Two times a day (BID) | ORAL | Status: DC
  Administered 2021-08-22 – 2021-08-23 (×3): 10 mg via ORAL
  Filled 2021-08-22 (×3): qty 2

## 2021-08-22 MED ORDER — ALPRAZOLAM 0.25 MG PO TABS
0.2500 mg | ORAL_TABLET | Freq: Three times a day (TID) | ORAL | Status: DC | PRN
  Administered 2021-08-22: 11:00:00 0.25 mg via ORAL
  Filled 2021-08-22: qty 1

## 2021-08-22 NOTE — Progress Notes (Signed)
Pt anxious and restless, requested Xanax for anxiety, MD notified, med ordered and administered.

## 2021-08-22 NOTE — Progress Notes (Signed)
OT Cancellation Note  Patient Details Name: DAYSON ABOUD MRN: [id number] DOB: 1941-07-24   Cancelled Treatment:    Reason Eval/Treat Not Completed: Medical issues which prohibited therapy. Patient on BIPAP with elevated RR and nurse actively coaching patient on breathing. Will f/u as able and when patient hemodynamically stable.  Sosha Shepherd L Shiraz Bastyr 08/22/2021, 1:09 PM

## 2021-08-22 NOTE — Plan of Care (Signed)

## 2021-08-22 NOTE — Progress Notes (Signed)
Pt more alert today at times. Pt O2 at 8 liters, PRN to maintain sats above 90%. Pt Restless at times requesting to be placed on and off Bi-pap. Pt on and off throughout the day, pt able to sense when he is lethargic and need to go onto Bipap. Pt resp rate beyond set parameters of Bipap. RRT raised the parameters of the Bipap so pt can benefit from wearing Bi-pap without  interruptions.Xanax given and effective, RR begin to decrease. Pt sats remain stable. 91-95%. Pt alert at times having conversation with family intermittently. Took all meds in applesauce and tolerated. Will cont to monitor.

## 2021-08-22 NOTE — Progress Notes (Signed)
Daily Progress Note   Patient Name: Jacob Bishop       Date: 08/22/2021 DOB: 29-Aug-1940  Age: 80 y.o. MRN#: [id number] Attending Physician: Kayleen Memos, DO Primary Care Physician: [name], NP Admit Date: 07/24/2021  Reason for Consultation/Follow-up: Establishing goals of care  Subjective: I saw and examined Jacob Bishop today.  He was awake and alert and brighter appearing.  Wearing BiPAP during my encounter.  Denies shortness of breath or other needs.  I also met with his family outside of his room to discuss potential care plan and goals moving forward.  See below.  Length of Stay: 29  Current Medications: Scheduled Meds:   (feeding supplement) PROSource Plus  30 mL Oral Daily   apixaban  5 mg Oral BID   budesonide (PULMICORT) nebulizer solution  0.5 mg Nebulization BID   buPROPion  150 mg Oral Daily   chlorhexidine  15 mL Mouth Rinse BID   Chlorhexidine Gluconate Cloth  6 each Topical Daily   feeding supplement  237 mL Oral BID BM   hydrocortisone  25 mg Oral BID   insulin aspart  0-15 Units Subcutaneous TID WC   insulin aspart  0-5 Units Subcutaneous QHS   insulin aspart  3 Units Subcutaneous TID WC   insulin glargine-yfgn  15 Units Subcutaneous QHS   ipratropium-albuterol  3 mL Inhalation BID   loratadine  10 mg Oral Daily   mouth rinse  15 mL Mouth Rinse q12n4p   metoprolol tartrate  50 mg Oral BID   midodrine  10 mg Oral BID WC   multivitamin with minerals  1 tablet Oral Daily   oxybutynin  10 mg Oral Daily   pantoprazole  40 mg Oral Daily   polyethylene glycol  17 g Oral Daily   simvastatin  20 mg Oral q1800   sodium chloride flush  3 mL Intravenous Q12H   tamsulosin  0.4 mg Oral BID    Continuous Infusions:    PRN Meds: acetaminophen **OR**  acetaminophen, guaiFENesin-dextromethorphan, levalbuterol, lip balm  Physical Exam         Awake and alert on on BiPAP  no edema Rate controlled Has venous stasis changes both LE No focal deficits  Vital Signs: BP 105/60    Pulse 77    Temp 97.6 F (36.4 C)  Resp (!) 22    Ht 5' 11"  (1.803 m)    Wt 107.4 kg    SpO2 96%    BMI 33.02 kg/m  SpO2: SpO2: 96 % O2 Device: O2 Device: High Flow Nasal Cannula O2 Flow Rate: O2 Flow Rate (L/min): 8 L/min  Intake/output summary:  Intake/Output Summary (Last 24 hours) at 08/22/2021 1819 Last data filed at 08/22/2021 0500 Gross per 24 hour  Intake 3 ml  Output 575 ml  Net -572 ml    LBM: Last BM Date: 08/20/21 Baseline Weight: Weight: 110 kg Most recent weight: Weight: 107.4 kg       Palliative Assessment/Data:      Patient Active Problem List   Diagnosis Date Noted   Single subsegmental pulmonary embolism without acute cor pulmonale (HCC)    Acute deep vein thrombosis (DVT) of distal vein of right lower extremity (HCC)    Shortness of breath    Palliative care by specialist    Chest pain 07/27/2021   Depression 07/25/2021   Pressure injury of skin 07/25/2021   Acute pulmonary embolus ([place]) 07/24/2021   Acute on chronic respiratory failure with hypoxia ([place]) 06/30/2021   DM type 2 with diabetic mixed hyperlipidemia ([place]) 06/30/2021   Mass of lung 06/27/2021   Liver lesion, left lobe 06/27/2021   Chronic respiratory failure with hypoxia ([place]) 06/27/2021   OA (osteoarthritis) of knee 10/04/2018   Other secondary pulmonary hypertension ([place]) 04/06/2018   Osteoarthritis, hand 05/05/2014   Carpal tunnel syndrome 05/02/2014   Eczema 01/23/2014   Thrombocytopenia ([place]) 11/19/2012   Overactive bladder 08/24/2012   Osteoarthritis of right knee 01/22/2012   Benign positional vertigo 07/09/2011   CAD, NATIVE VESSEL 10/22/2009   EDEMA 08/03/2009   Obstructive sleep apnea 05/03/2009   SKIN LESION 09/23/2007   Diabetes mellitus  type 2, uncontrolled 03/23/2007   Hyperlipidemia 03/23/2007   Essential hypertension 03/23/2007   COLONIC POLYPS, HX OF 03/23/2007   Morbid obesity ([place] [address]) 03/23/2007    Palliative Care Assessment & Plan   Patient Profile:    Assessment:  Acute PE CT imaging showing multiple pulmonary nodules/masses as well as metastatic lesions in liver.  Has OSA DM, recent COVID-19 infection Functional decline Dyspnea on slight exertion   Recommendations/Plan: DNR/DNI I met today with patient's family including his wife and stepdaughter.  We reviewed options for care including a pathway of continuation of aggressive care versus refocusing his care only on things for his comfort moving forward.  Discussed that the realistic long term options moving forward would be:  Continuation of current care with potential transition to LTAC for continued BiPAP and high flow nasal cannula support.  This is with the understanding that his underlying cancer is continuing to progress and he will eventually decompensate at some point in the future.  Transition home with hospice support in which case we would work to get him set up with [facility] at time of discharge (family reports not able to support him sufficiently at home).  Working to transition to full comfort care in the hospital and forego further BiPAP/high flow nasal cannula.  He is currently awake and alert and wants to continue with BiPAP support as he feels better utilizing it.  We did discuss discontinuation of BiPAP/high flow nasal cannula if he reaches a point where he is not awake and interactive as at that point in time the BiPAP would be prolonging the process of him dying rather than contributing to his quality of life.  Family is  asking to give him a break from continued labs and report wanting him to be comfortable, but he and family are not at a point of wanting transition to full comfort care and forgo further Bipap/HFNC.  He was offered a bed at  [facility] but they do not want him to go there.  They would like for him to go to [facility].  I told him that I was not sure about the financial repercussions of declining LTAC bed that has been offered and I would ask for case management to follow-up with them to discuss further.  With current goals of continuing BiPAP and high flow nasal cannula, I do not see another option for discharge from the hospital if he does not go to LTAC.   Goals of Care and Additional Recommendations: Limitations on Scope of Treatment: Full Scope Treatment Only limitation is DO NOT RESUSCITATE/DO NOT INTUBATE. Code Status: After family meeting today [place] is revised as DNR/DNI as of 08-05-2021.    Code Status Orders  (From admission, onward)           Start     Ordered   07/24/21 2305  Full code  Continuous        07/24/21 2306           Code Status History     Date Active Date Inactive Code Status Order ID Comments User Context   06/30/2021 0055 07/02/2021 2313 Full Code [id number]  Orene Desanctis, DO ED   10/04/2018 1538 10/11/2018 1930 Full Code [id number]  Gaynelle Arabian, MD Inpatient       Prognosis:  Guarded   Discharge Planning: To be determined  Care plan was discussed with patient and family.    Thank you for allowing the Palliative Medicine Team to assist in the care of this patient.   Total Time 50 Prolonged Time Billed  no    Greater than 50%  of this time was spent counseling and coordinating care related to the above assessment and plan.  Micheline Rough, MD  Please contact Palliative Medicine Team phone at [phone] for questions and concerns.

## 2021-08-22 NOTE — Progress Notes (Signed)
Daily Progress Note   Patient Name: Jacob Bishop       Date: 08/22/2021 DOB: [birth year]  Age: 80 y.o. MRN#: [id number] Attending Physician: Kayleen Memos, DO Primary Care Physician: [name], NP Admit Date: 07/24/2021  Reason for Consultation/Follow-up: Establishing goals of care  Subjective: Notified that family is requesting to meet again to discuss goals of care.  I saw and examined Mr. Grasmick today.  He was awake and alert but much weaker than during previous encounter with him a couple of weeks ago.  As opposed to last time I met with him, he was not really able to tell me about his situation, care plan, or events of the last several days.  He reports his breathing is "so-so."  Denies any other needs at this time.  Limited in his willingness or ability to engage in conversation today.  I called and was able to reach his stepdaughter, Vania Rea.  She reports that she and her mother have seen that he is growing weaker and are concerned about his quality of life moving forward.  She notes that he has been more confused and is not really able to express his wishes as he was able to do so a couple of weeks ago.  They would like to meet tomorrow outside of his room to discuss goals for his care moving forward.  Length of Stay: 29  Current Medications: Scheduled Meds:   (feeding supplement) PROSource Plus  30 mL Oral Daily   apixaban  5 mg Oral BID   budesonide (PULMICORT) nebulizer solution  0.5 mg Nebulization BID   buPROPion  150 mg Oral Daily   chlorhexidine  15 mL Mouth Rinse BID   Chlorhexidine Gluconate Cloth  6 each Topical Daily   feeding supplement  237 mL Oral BID BM   hydrocortisone  25 mg Oral BID   insulin aspart  0-15 Units Subcutaneous TID WC   insulin aspart  0-5 Units  Subcutaneous QHS   insulin aspart  3 Units Subcutaneous TID WC   insulin glargine-yfgn  15 Units Subcutaneous QHS   ipratropium-albuterol  3 mL Inhalation BID   loratadine  10 mg Oral Daily   mouth rinse  15 mL Mouth Rinse q12n4p   metoprolol tartrate  50 mg Oral BID   midodrine  10 mg  Oral BID WC   multivitamin with minerals  1 tablet Oral Daily   oxybutynin  10 mg Oral Daily   pantoprazole  40 mg Oral Daily   polyethylene glycol  17 g Oral Daily   simvastatin  20 mg Oral q1800   sodium chloride flush  3 mL Intravenous Q12H   tamsulosin  0.4 mg Oral BID    Continuous Infusions:    PRN Meds: acetaminophen **OR** acetaminophen, guaiFENesin-dextromethorphan, levalbuterol, lip balm  Physical Exam           Increased work of breathing On  No edema Rate controlled Has venous stasis changes both LE No focal deficits  Vital Signs: BP (!) 97/52 (BP Location: Right Arm)    Pulse 83    Temp 98 F (36.7 C)    Resp 18    Ht _0  (1.803 m)    Wt 107.4 kg    SpO2 97%    BMI 33.02 kg/m  SpO2: SpO2: 97 % O2 Device: O2 Device: High Flow Nasal Cannula O2 Flow Rate: O2 Flow Rate (L/min): 8 L/min  Intake/output summary:  Intake/Output Summary (Last 24 hours) at 08/22/2021 0811 Last data filed at 08/22/2021 0500 Gross per 24 hour  Intake 3 ml  Output 1925 ml  Net -1922 ml    LBM: Last BM Date: 08/20/21 Baseline Weight: Weight: 110 kg Most recent weight: Weight: 107.4 kg       Palliative Assessment/Data:      Patient Active Problem List   Diagnosis Date Noted   Single subsegmental pulmonary embolism without acute cor pulmonale (HCC)    Acute deep vein thrombosis (DVT) of distal vein of right lower extremity (HCC)    Shortness of breath    Palliative care by specialist    Chest pain 07/27/2021   Depression 07/25/2021   Pressure injury of skin 07/25/2021   Acute pulmonary embolus ([place]) 07/24/2021   Acute on chronic respiratory failure with hypoxia ([place]) 06/30/2021    DM type 2 with diabetic mixed hyperlipidemia ([place]) 06/30/2021   Mass of lung 06/27/2021   Liver lesion, left lobe 06/27/2021   Chronic respiratory failure with hypoxia ([place]) 06/27/2021   OA (osteoarthritis) of knee 10/04/2018   Other secondary pulmonary hypertension ([place]) 04/06/2018   Osteoarthritis, hand 05/05/2014   Carpal tunnel syndrome 05/02/2014   Eczema 01/23/2014   Thrombocytopenia ([place]) 11/19/2012   Overactive bladder 08/24/2012   Osteoarthritis of right knee 01/22/2012   Benign positional vertigo 07/09/2011   CAD, NATIVE VESSEL 10/22/2009   EDEMA 08/03/2009   Obstructive sleep apnea 05/03/2009   SKIN LESION 09/23/2007   Diabetes mellitus type 2, uncontrolled 03/23/2007   Hyperlipidemia 03/23/2007   Essential hypertension 03/23/2007   COLONIC POLYPS, HX OF 03/23/2007   Morbid obesity ([place]) 03/23/2007    Palliative Care Assessment & Plan   Patient Profile:    Assessment:  Acute PE CT imaging showing multiple pulmonary nodules/masses as well as metastatic lesions in liver.  Has OSA DM, recent COVID-19 infection Functional decline Dyspnea on slight exertion   Recommendations/Plan: DNR/DNI Discussed with his step-daughter, Vania Rea today.  Plan for family meeting tomorrow at 26.   Goals of Care and Additional Recommendations: Limitations on Scope of Treatment: Full Scope Treatment Only limitation is DO NOT RESUSCITATE/DO NOT INTUBATE. Code Status: After family meeting today [place] is revised as DNR/DNI as of 08-05-2021.    Code Status Orders  (From admission, onward)           Start  Ordered   07/24/21 2305  Full code  Continuous        07/24/21 2306           Code Status History     Date Active Date Inactive Code Status Order ID Comments User Context   06/30/2021 0055 07/02/2021 2313 Full Code [id number]  Orene Desanctis, DO ED   10/04/2018 1538 10/11/2018 1930 Full Code [id number]  Gaynelle Arabian, MD Inpatient       Prognosis:  Guarded    Discharge Planning: To be determined  Care plan was discussed with patient and family.    Thank you for allowing the Palliative Medicine Team to assist in the care of this patient.   Total Time 20 Prolonged Time Billed  no    Greater than 50%  of this time was spent counseling and coordinating care related to the above assessment and plan.  Micheline Rough, MD  Please contact Palliative Medicine Team phone at [phone] for questions and concerns.

## 2021-08-22 NOTE — Plan of Care (Signed)
  Problem: Education: Goal: Knowledge of General Education information will improve Description Including pain rating scale, medication(s)/side effects and non-pharmacologic comfort measures Outcome: Progressing   Problem: Health Behavior/Discharge Planning: Goal: Ability to manage health-related needs will improve Outcome: Progressing   

## 2021-08-22 NOTE — TOC Progression Note (Signed)
Transition of Care [facility]) - Progression Note    Patient Details  Name: Jacob Bishop MRN: [id number] Date of Birth: Apr 11, 1941  [facility] CM/SW Contact  Purcell Mouton, RN Phone Number: 08/22/2021, 3:54 PM  Clinical Narrative:    Spoke with pt and family at bedside concerning bed offer to [facility]. [facility] did not offer a bed. Family states that they would like for pt to stay here in hospital. Explained to family that this is [facility] speciality and asked them to think about LTAC.    Expected Discharge Plan: [place] Barriers to Discharge: No Barriers Identified  Expected Discharge Plan and Services Expected Discharge Plan: Sisters arrangements for the past 2 months: Single Family Home                                       Social Determinants of Health (SDOH) Interventions    Readmission Risk Interventions No flowsheet data found.

## 2021-08-22 NOTE — Progress Notes (Signed)
PROGRESS NOTE    Jacob Bishop  LSL:[id number] DOB: March 17, 1941 DOA: 07/24/2021 PCP: [name], NP   Chief Complaint  Patient presents with   Shortness of Breath    Brief Narrative:  Mr. Harton was admitted to the hospital with the working diagnosis of acute hypoxemic respiratory failure, in the setting of multiple disused pulmonary nodules, masses complicated by acute pulmonary embolism and recent COVID-19 viral pneumonia.    80 year old male past medical history for coronary artery disease, type 2 diabetes mellitus, and obstructive sleep apnea. He also has multiple lung nodules and liver lesions concerning for metastatic disease.  Recent hospitalization 11/5-11/03/2021 for acute hypoxic respiratory failure due to SARS COVID-19 viral pneumonia.  He presented on 07/24/2021 with dyspnea was admitted by the hospitalist service and found to have segmental, acute PE.  CT chest revealed multiple pulmonary nodules throughout the lungs and lesions in the liver.  His respiratory status has been progressively worsening, requiring as needed BiPAP.  Currently on high flow nasal cannula 15 L.   12/12 patient had worsening respiratory failure, transferred to stepdown unit for noninvasive mechanical ventilation. Patient with metastatic lesions, palliative care was consulted.  CODE STATUS changed to DNR/DNI.   12/16 patient developed atrial fibrillation with RVR, placed on metoprolol with good response.   Patient continue to be very weak and deconditioned Using Bipap as needed.    Underwent diuresis with no much improvement in oxygenation.   Tapering steroids, but he developed hypotension, possible adrenal insufficiency, now started on IV hydrocortisone.  On p.o. Cortef 25 mg twice daily since 08/18/2021.  08/21/2021: Seen and examined at bedside.  On BiPAP, weak appearing.  ABG done this morning pH 7.377/66.2/77.5 while on high flow nasal cannula 15 L.  Repeat chest x-ray showing multiple pulmonary  nodules and small bilateral pleural effusions versus atelectasis.  1 dose IV Lasix given 20 mg x 1.  08/22/21: Patient was seen and examined at his bedside.  His wife was present in the room.  He is more alert and interactive.  He has no new complaints.  Goals of care discussion with palliative care and family planned today.     Assessment & Plan:   Principal Problem:   Acute pulmonary embolus (HCC) Active Problems:   Obstructive sleep apnea   Essential hypertension   CAD, NATIVE VESSEL   Thrombocytopenia (HCC)   Mass of lung   Liver lesion, left lobe   Acute on chronic respiratory failure with hypoxia (HCC)   DM type 2 with diabetic mixed hyperlipidemia (HCC)   Depression   Pressure injury of skin   Chest pain   Palliative care by specialist   Shortness of breath   Single subsegmental pulmonary embolism without acute cor pulmonale (HCC)   Acute deep vein thrombosis (DVT) of distal vein of right lower extremity (HCC)  #1 acute on chronic hypoxemic hypercarbic respiratory failure, secondary to multiple pulmonary nodules/masses, complicated by recent SARS COVID-19 viral pneumonia, acute PE -Patient deemed not stable for biopsy, seen by PCCM Dr. Valeta Harms, but malignancy in differential with concern for metastatic disease to the liver. -Due to patient's respiratory status unable to perform further pulmonary diagnostics for lung nodules and possible underlying malignancy. Worsening respiratory status, currently requiring 15 L high flow nasal cannula and as needed BiPAP to maintain a saturation greater than 90%. Personally reviewed ABG done this morning pH 7.377/66.2/77.5 while on high flow nasal cannula 15 L. Personally reviewed chest x-ray done on 08/13/2021 which showed multiple pulmonary nodules  and small bilateral pleural effusions versus atelectasis.  1 dose IV Lasix given 20 mg x 1. Continue to maintain oxygen saturation greater than 90%.  2.  New onset A. fib/acute on chronic  diastolic CHF Continue metoprolol for rate control, Eliquis for anticoagulation. -Patient with diffuse coarse breath sounds/crackles noted on examination.  Closely monitor on telemetry.  3.  Type 2 diabetes mellitus/dyslipidemia -Hemoglobin A1c 6.2 (07/01/2021). -CBG 153 this morning. -Continue Semglee, SSI.  4.  OSA/obesity class I BMI 33. -Outpatient follow-up with PCP.  5.  Thrombocytopenia/worsening anemia of chronic disease Hemoglobin dropped 9.3 from 11.1. Platelet 106 from 115. Continue to monitor  6.  Improving hyponatremia -Serum sodium 134 from 130.  7.  Hepatic lesion in the left lobe of the liver/splenomegaly seen on CT scan.  8.  Chronic anxiety On Wellbutrin prior to admission. Continue to hold off benzodiazepine and narcotics to avoid worsening of CO2 retention.  9.  New adrenal insufficiency -Blood pressure improved with hydrocortisone patient noted to have been on steroids during his prolonged hospitalization. -Was on hydrocortisone 50 mg twice daily x3 days, currently on 25 mg twice daily and taper down to hydrocortisone 15 mg every morning and 10 mg every afternoon hopefully in the next 24 to 48 hours.  10.  Stage II bilateral buttocks pressure ulcer, POA -Continue current wound care. Pressure Injury 07/25/21 Buttocks Right Stage 2 -  Partial thickness loss of dermis presenting as a shallow open injury with a red, pink wound bed without slough. (Active)  07/25/21 1530  Location: Buttocks  Location Orientation: Right  Staging: Stage 2 -  Partial thickness loss of dermis presenting as a shallow open injury with a red, pink wound bed without slough.  Wound Description (Comments):   Present on Admission: Yes     Pressure Injury 07/25/21 Buttocks Left Stage 2 -  Partial thickness loss of dermis presenting as a shallow open injury with a red, pink wound bed without slough. (Active)  07/25/21 1531  Location: Buttocks  Location Orientation: Left  Staging: Stage  2 -  Partial thickness loss of dermis presenting as a shallow open injury with a red, pink wound bed without slough.  Wound Description (Comments):   Present on Admission: Yes     Pressure Injury 08/06/21 Sacrum Medial Deep Tissue Pressure Injury - Purple or maroon localized area of discolored intact skin or blood-filled blister due to damage of underlying soft tissue from pressure and/or shear. (Active)  08/06/21 1600  Location: Sacrum  Location Orientation: Medial  Staging: Deep Tissue Pressure Injury - Purple or maroon localized area of discolored intact skin or blood-filled blister due to damage of underlying soft tissue from pressure and/or shear.  Wound Description (Comments):   Present on Admission: No    Goals of care DNR Palliative care team following Patient has a very poor prognosis with worsening respiratory failure with concern for multiple lung nodules, liver lesions.     DVT prophylaxis: Eliquis Code Status: DNR Family Communication: Updated his stepdaughter at bedside.    Disposition: Very poor prognosis  Status is: Inpatient  Remains inpatient appropriate because: Severity of illness       Consultants:  PCCM: Dr. [place] Bells 07/29/2021 Palliative care: Dr. Rowe Pavy 08/01/2021  Procedures:  CT angiogram chest 07/24/2021 Chest x-ray 08/14/2021, 08/03/2021, 07/31/2021, 08/23/2021 Abdominal ultrasound 07/29/2021 2D echo 07/25/2021 Lower extremity Dopplers 07/25/2021  Antimicrobials:  Anti-infectives (From admission, onward)    None          Objective: Vitals:  08/22/21 0805 08/22/21 0809 08/22/21 1032 08/22/21 1253  BP:    105/60  Pulse:   (!) 107 77  Resp:   (!) 39 (!) 22  Temp:    97.6 F (36.4 C)  TempSrc:      SpO2: 97% 97% 93% 96%  Weight:      Height:        Intake/Output Summary (Last 24 hours) at 08/22/2021 1813 Last data filed at 08/22/2021 0500 Gross per 24 hour  Intake 3 ml  Output 575 ml  Net -572 ml   Filed Weights   07/28/21  0414 08/03/21 1625 08/19/21 0423  Weight: 110 kg 106.2 kg 107.4 kg    Examination:  General exam: Frail-appearing in no acute distress.  He is on BiPAP.   Respiratory system: Diffuse rales bilaterally.  Poor inspiratory effort.   Cardiovascular system: Regular rate and rhythm no rubs or gallops.   Gastrointestinal system: Soft nontender normal bowel sounds present.   Central nervous system: Alert moves all 4 extremities.   Extremities: No lower extremity edema bilaterally.   Skin: Pressure wounds as stated above. Psychiatry: Mood is appropriate for condition and setting.    Data Reviewed: I have personally reviewed following labs and imaging studies  CBC: Recent Labs  Lab 08/16/21 0428 08/19/21 0350 08/20/21 0350 08/21/21 0354  WBC 7.0 8.2 9.4 6.0  NEUTROABS  --   --  5.4  --   HGB 11.0* 11.1* 11.1* 9.3*  HCT 33.3* 33.6* 33.1* 27.7*  MCV 92.0 91.6 90.2 91.1  PLT 90* 120* 118* 106*    Basic Metabolic Panel: Recent Labs  Lab 08/16/21 0428 08/19/21 0350 08/20/21 0350 08/21/21 0354  NA 136 134* 130* 134*  K 3.6 4.4 4.4 3.8  CL 92* 92* 90* 91*  CO2 32 34* 32 35*  GLUCOSE 143* 147* 158* 131*  BUN 30* 30* 31* 33*  CREATININE 0.84 0.81 0.90 0.88  CALCIUM 8.1* 7.8* 7.7* 8.0*    GFR: Estimated Creatinine Clearance: 86.2 mL/min (by C-G formula based on SCr of 0.88 mg/dL).  Liver Function Tests: No results for input(s): AST, ALT, ALKPHOS, BILITOT, PROT, ALBUMIN in the last 168 hours.  CBG: Recent Labs  Lab 08/21/21 1649 08/21/21 2031 08/22/21 0723 08/22/21 1145 08/22/21 1725  GLUCAP 244* 202* 130* 169* 201*     No results found for this or any previous visit (from the past 240 hour(s)).        Radiology Studies: DG CHEST PORT 1 VIEW  Result Date: 08/21/2021 CLINICAL DATA:  Respiratory failure. EXAM: PORTABLE CHEST 1 VIEW COMPARISON:  Multiple recent chest x-rays and a chest CT from 07/24/2021. FINDINGS: Persistent bilateral pulmonary lesions along  with patchy bilateral infiltrates. No definite pleural effusions. No pneumothorax. IMPRESSION: Persistent bilateral pulmonary lesions and patchy bilateral infiltrates. Electronically Signed   By: Marijo Sanes M.D.   On: 08/21/2021 13:59        Scheduled Meds:  (feeding supplement) PROSource Plus  30 mL Oral Daily   apixaban  5 mg Oral BID   budesonide (PULMICORT) nebulizer solution  0.5 mg Nebulization BID   buPROPion  150 mg Oral Daily   chlorhexidine  15 mL Mouth Rinse BID   Chlorhexidine Gluconate Cloth  6 each Topical Daily   feeding supplement  237 mL Oral BID BM   hydrocortisone  25 mg Oral BID   insulin aspart  0-15 Units Subcutaneous TID WC   insulin aspart  0-5 Units Subcutaneous QHS   insulin aspart  3 Units Subcutaneous TID WC   insulin glargine-yfgn  15 Units Subcutaneous QHS   ipratropium-albuterol  3 mL Inhalation BID   loratadine  10 mg Oral Daily   mouth rinse  15 mL Mouth Rinse q12n4p   metoprolol tartrate  50 mg Oral BID   midodrine  10 mg Oral BID WC   multivitamin with minerals  1 tablet Oral Daily   oxybutynin  10 mg Oral Daily   pantoprazole  40 mg Oral Daily   polyethylene glycol  17 g Oral Daily   simvastatin  20 mg Oral q1800   sodium chloride flush  3 mL Intravenous Q12H   tamsulosin  0.4 mg Oral BID   Continuous Infusions:     LOS: 29 days    Time spent: 40 minutes    Kayleen Memos, MD [facility]   To contact the attending provider between 7A-7P or the covering provider during after hours 7P-7A, please log into the web site [email or web address] and access using universal [place] password for that web site. If you do not have the password, please call the hospital operator.  08/22/2021, 6:13 PM

## 2021-08-22 NOTE — Progress Notes (Signed)
PT Cancellation Note  Patient Details Name: Jacob Bishop MRN: [id number] DOB: [birth year]   Cancelled Treatment:    Reason Eval/Treat Not Completed: Medical issues which prohibited therapy (pt currently on BiPAP. Will follow.)  Philomena Doheny PT 08/22/2021  Acute Rehabilitation Services Pager [phone] Office [phone]

## 2021-08-22 NOTE — Plan of Care (Signed)
°  Problem: Education: Goal: Knowledge of General Education information will improve Description: Including pain rating scale, medication(s)/side effects and non-pharmacologic comfort measures 08/22/2021 1217 by Zadie Rhine, RN Outcome: Progressing 08/22/2021 1118 by Zadie Rhine, RN Outcome: Progressing   Problem: Health Behavior/Discharge Planning: Goal: Ability to manage health-related needs will improve 08/22/2021 1217 by Zadie Rhine, RN Outcome: Progressing 08/22/2021 1118 by Zadie Rhine, RN Outcome: Progressing   Problem: Clinical Measurements: Goal: Ability to maintain clinical measurements within normal limits will improve Outcome: Progressing Goal: Will remain free from infection Outcome: Progressing

## 2021-08-23 DIAGNOSIS — C799 Secondary malignant neoplasm of unspecified site: Secondary | ICD-10-CM | POA: Diagnosis not present

## 2021-08-23 LAB — COMPREHENSIVE METABOLIC PANEL
ALT: 20 U/L (ref 0–44)
AST: 22 U/L (ref 15–41)
Albumin: 2.4 g/dL — ABNORMAL LOW (ref 3.5–5.0)
Alkaline Phosphatase: 158 U/L — ABNORMAL HIGH (ref 38–126)
Anion gap: 8 (ref 5–15)
BUN: 36 mg/dL — ABNORMAL HIGH (ref 8–23)
CO2: 35 mmol/L — ABNORMAL HIGH (ref 22–32)
Calcium: 8.2 mg/dL — ABNORMAL LOW (ref 8.9–10.3)
Chloride: 92 mmol/L — ABNORMAL LOW (ref 98–111)
Creatinine, Ser: 0.82 mg/dL (ref 0.61–1.24)
GFR, Estimated: >60 mL/min (ref >60)
Glucose, Bld: 178 mg/dL — ABNORMAL HIGH (ref 70–99)
Potassium: 4 mmol/L (ref 3.5–5.1)
Sodium: 135 mmol/L (ref 135–145)
Total Bilirubin: 1.1 mg/dL (ref 0.3–1.2)
Total Protein: 5.8 g/dL — ABNORMAL LOW (ref 6.5–8.1)

## 2021-08-23 LAB — CBC WITH DIFFERENTIAL/PLATELET
Abs Immature Granulocytes: 0.06 10*3/uL (ref 0.00–0.07)
Basophils Absolute: 0 10*3/uL (ref 0.0–0.1)
Basophils Relative: 0 %
Eosinophils Absolute: 0 10*3/uL (ref 0.0–0.5)
Eosinophils Relative: 0 %
HCT: 35.7 % — ABNORMAL LOW (ref 39.0–52.0)
Hemoglobin: 11.5 g/dL — ABNORMAL LOW (ref 13.0–17.0)
Immature Granulocytes: 1 %
Lymphocytes Relative: 32 %
Lymphs Abs: 2.7 10*3/uL (ref 0.7–4.0)
MCH: 29.9 pg (ref 26.0–34.0)
MCHC: 32.2 g/dL (ref 30.0–36.0)
MCV: 93 fL (ref 80.0–100.0)
Monocytes Absolute: 0.7 10*3/uL (ref 0.1–1.0)
Monocytes Relative: 9 %
Neutro Abs: 5 10*3/uL (ref 1.7–7.7)
Neutrophils Relative %: 58 %
Platelets: 126 10*3/uL — ABNORMAL LOW (ref 150–400)
RBC: 3.84 MIL/uL — ABNORMAL LOW (ref 4.22–5.81)
RDW: 15.6 % — ABNORMAL HIGH (ref 11.5–15.5)
WBC: 8.5 10*3/uL (ref 4.0–10.5)
nRBC: 0 % (ref 0.0–0.2)

## 2021-08-23 LAB — GLUCOSE, CAPILLARY
Glucose-Capillary: 144 mg/dL — ABNORMAL HIGH (ref 70–99)
Glucose-Capillary: 146 mg/dL — ABNORMAL HIGH (ref 70–99)
Glucose-Capillary: 212 mg/dL — ABNORMAL HIGH (ref 70–99)
Glucose-Capillary: 163 mg/dL — ABNORMAL HIGH (ref 70–99)

## 2021-08-23 LAB — PHOSPHORUS: Phosphorus: 3.1 mg/dL (ref 2.5–4.6)

## 2021-08-23 LAB — MAGNESIUM: Magnesium: 2.4 mg/dL (ref 1.7–2.4)

## 2021-08-23 MED ORDER — HYDROCORTISONE 20 MG PO TABS
20.0000 mg | ORAL_TABLET | Freq: Two times a day (BID) | ORAL | Status: DC
  Administered 2021-08-23 – 2021-08-25 (×4): 20 mg via ORAL
  Filled 2021-08-23 (×4): qty 1

## 2021-08-23 MED ORDER — ALPRAZOLAM 0.25 MG PO TABS
0.2500 mg | ORAL_TABLET | Freq: Two times a day (BID) | ORAL | Status: DC | PRN
  Administered 2021-08-23 – 2021-08-24 (×3): 0.25 mg via ORAL
  Filled 2021-08-23 (×3): qty 1

## 2021-08-23 MED ORDER — MIDODRINE HCL 5 MG PO TABS
10.0000 mg | ORAL_TABLET | Freq: Three times a day (TID) | ORAL | Status: DC
  Administered 2021-08-24 – 2021-08-25 (×3): 10 mg via ORAL
  Filled 2021-08-23 (×3): qty 2

## 2021-08-23 MED ORDER — MIDODRINE HCL 5 MG PO TABS: 10.0000 mg | ORAL_TABLET | Freq: Three times a day (TID) | ORAL | Status: DC

## 2021-08-23 MED ORDER — LORAZEPAM 2 MG/ML IJ SOLN
0.5000 mg | Freq: Once | INTRAMUSCULAR | Status: DC
Start: 2021-08-23 — End: 2021-08-23

## 2021-08-23 NOTE — Progress Notes (Signed)
Respiratory informed that patient likes to continually take the BiPAP mask off and on very frequently. Patient also likes to completely take the straps off of the BiPAP mask and holding it in place with his hand or just allowing the mask to lay in his lap without informing anyone which causes the patient's oxygenation saturation to drop drastically since he goes from having oxygen to having none. Patient states he needs to know how to turn the BiPAP machine off and education provided that he should not be turning the BiPAP off himself because it is helping him with his breathing. Education provided on how respiratory are the ones who should be changing any settings on the machine instead of him, the patient did not like that. At the beginning of the shift, the patient informed the nurse that he feels he does not need the BiPAP machine and asked what the point of it was. Education provided on how the BiPAP provides him oxygen and helps prevent retention of carbon dioxide.

## 2021-08-23 NOTE — Progress Notes (Signed)
Received call from [place], Case Manager, completed referral to [facility]. Family updated and agreeable. SRP, RN

## 2021-08-23 NOTE — Progress Notes (Signed)
OT Cancellation Note  Patient Details Name: Jacob Bishop MRN: [id number] DOB: 13-Jan-1941   Cancelled Treatment:    Reason Eval/Treat Not Completed: Patient declined, no reason specified despite max encouragement.  [name] 08/23/2021, 3:27 PM

## 2021-08-23 NOTE — Consult Note (Signed)
Full Oncology Consult to follow Plan: After reviewing all the scans and reports with the patients family, I recommended hospice and family is agreeable. Informed nursing to get hospice team to talk to them about in-patient hospice options

## 2021-08-23 NOTE — Progress Notes (Signed)
Daily Progress Note   Patient Name: Jacob Bishop       Date: 08/23/2021 DOB: 1941-01-29  Age: 80 y.o. MRN#: [id number] Attending Physician: Kayleen Memos, DO Primary Care Physician: [name], NP Admit Date: 07/24/2021  Reason for Consultation/Follow-up: Establishing goals of care  Subjective: I saw and examined Jacob Bishop today.  He was awake but semi confused at time that I saw him.  I later saw his wife who reports that she had the opportunity to discuss further with oncology and they are now wanting him to transition to residential hospice in [place].  Length of Stay: 30  Current Medications: Scheduled Meds:   (feeding supplement) PROSource Plus  30 mL Oral Daily   apixaban  5 mg Oral BID   budesonide (PULMICORT) nebulizer solution  0.5 mg Nebulization BID   buPROPion  150 mg Oral Daily   chlorhexidine  15 mL Mouth Rinse BID   Chlorhexidine Gluconate Cloth  6 each Topical Daily   feeding supplement  237 mL Oral BID BM   hydrocortisone  20 mg Oral BID   insulin aspart  0-15 Units Subcutaneous TID WC   insulin aspart  0-5 Units Subcutaneous QHS   insulin aspart  3 Units Subcutaneous TID WC   insulin glargine-yfgn  15 Units Subcutaneous QHS   ipratropium-albuterol  3 mL Inhalation BID   loratadine  10 mg Oral Daily   mouth rinse  15 mL Mouth Rinse q12n4p   metoprolol tartrate  50 mg Oral BID   midodrine  10 mg Oral TID WC   multivitamin with minerals  1 tablet Oral Daily   oxybutynin  10 mg Oral Daily   pantoprazole  40 mg Oral Daily   polyethylene glycol  17 g Oral Daily   simvastatin  20 mg Oral q1800   sodium chloride flush  3 mL Intravenous Q12H   tamsulosin  0.4 mg Oral BID    Continuous Infusions:    PRN Meds: acetaminophen **OR** acetaminophen,  ALPRAZolam, guaiFENesin-dextromethorphan, levalbuterol, lip balm  Physical Exam         Awake and alert on on BiPAP  no edema Rate controlled Has venous stasis changes both LE No focal deficits  Vital Signs: BP 92/63 (BP Location: Left Arm)    Pulse 82    Temp  97.7 F (36.5 C) (Oral)    Resp (!) 24    Ht 5\' 11"  (1.803 m)    Wt 107.4 kg    SpO2 93%    BMI 33.02 kg/m  SpO2: SpO2: 93 % O2 Device: O2 Device: Bi-PAP O2 Flow Rate: O2 Flow Rate (L/min): 9 L/min  Intake/output summary:  Intake/Output Summary (Last 24 hours) at 08/23/2021 2152 Last data filed at 08/23/2021 2000 Gross per 24 hour  Intake --  Output 500 ml  Net -500 ml    LBM: Last BM Date: 08/23/21 Baseline Weight: Weight: 110 kg Most recent weight: Weight: 107.4 kg       Palliative Assessment/Data:      Patient Active Problem List   Diagnosis Date Noted   Single subsegmental pulmonary embolism without acute cor pulmonale (HCC)    Acute deep vein thrombosis (DVT) of distal vein of right lower extremity (HCC)    Shortness of breath    Palliative care by specialist    Chest pain 07/27/2021   Depression 07/25/2021   Pressure injury of skin 07/25/2021   Acute pulmonary embolus ([place]) 07/24/2021   Acute on chronic respiratory failure with hypoxia ([place]) 06/30/2021   DM type 2 with diabetic mixed hyperlipidemia ([place]) 06/30/2021   Mass of lung 06/27/2021   Liver lesion, left lobe 06/27/2021   Chronic respiratory failure with hypoxia ([place]) 06/27/2021   OA (osteoarthritis) of knee 10/04/2018   Other secondary pulmonary hypertension ([place]) 04/06/2018   Osteoarthritis, hand 05/05/2014   Carpal tunnel syndrome 05/02/2014   Eczema 01/23/2014   Thrombocytopenia ([place]) 11/19/2012   Overactive bladder 08/24/2012   Osteoarthritis of right knee 01/22/2012   Benign positional vertigo 07/09/2011   CAD, NATIVE VESSEL 10/22/2009   EDEMA 08/03/2009   Obstructive sleep apnea 05/03/2009   SKIN LESION 09/23/2007   Diabetes  mellitus type 2, uncontrolled 03/23/2007   Hyperlipidemia 03/23/2007   Essential hypertension 03/23/2007   COLONIC POLYPS, HX OF 03/23/2007   Morbid obesity ([place]) 03/23/2007    Palliative Care Assessment & Plan   Patient Profile:    Assessment:  Acute PE CT imaging showing multiple pulmonary nodules/masses as well as metastatic lesions in liver.  Has OSA DM, recent COVID-19 infection Functional decline Dyspnea on slight exertion   Recommendations/Plan: DNR/DNI Patient's wife reports that family discussed today with oncology and, following this discussion, they would like for him to transition to residential hospice.  This would certainly be appropriate.  To this point, however, family has wanted to continue with Bipap support.  I do not think that this is something that will be continued at residential hospice, but will reach out to discuss further with hospice agency tomorrow.  I did not discuss stopping Bipap with his this evening as I want [place] to clarify plan and options with hospice.  Again, I do think that transition to residential hospice is certainly appropriate in his situation.   Goals of Care and Additional Recommendations: Limitations on Scope of Treatment: Full Scope Treatment Only limitation is DO NOT RESUSCITATE/DO NOT INTUBATE. Code Status: After family meeting today Jacob Bishop is revised as DNR/DNI as of 08-05-2021.    Code Status Orders  (From admission, onward)           Start     Ordered   07/24/21 2305  Full code  Continuous        07/24/21 2306           Code Status History     Date Active  Date Inactive Code Status Order ID Comments User Context   06/30/2021 0055 07/02/2021 2313 Full Code [id number]  Orene Desanctis, DO ED   10/04/2018 1538 10/11/2018 1930 Full Code [id number]  Gaynelle Arabian, MD Inpatient       Prognosis:  Guarded   Discharge Planning: To be determined  Care plan was discussed with patient and family.    Thank you for  allowing the Palliative Medicine Team to assist in the care of this patient.   Total Time 20 Prolonged Time Billed  no    Greater than 50%  of this time was spent counseling and coordinating care related to the above assessment and plan.  Micheline Rough, MD  Please contact Palliative Medicine Team phone at [phone] for questions and concerns.

## 2021-08-23 NOTE — Progress Notes (Signed)
Every time RT went to check on this pt he had his mask on the straps off. Advised him to keep the mask on and do not mess with it. Pt would agree but still do the same thing.

## 2021-08-23 NOTE — Progress Notes (Addendum)
PROGRESS NOTE    Jacob Bishop  Jacob Bishop:[id number] DOB: 1941/07/08 DOA: 07/24/2021 PCP: [name], NP   Chief Complaint  Patient presents with   Shortness of Breath    Brief Narrative:  Jacob Bishop was admitted to the hospital with the working diagnosis of acute hypoxemic respiratory failure, in the setting of multiple disused pulmonary nodules, masses complicated by acute pulmonary embolism and recent COVID-19 viral pneumonia.    80 year old male past medical history for coronary artery disease, type 2 diabetes mellitus, and obstructive sleep apnea. He also has multiple lung nodules and liver lesions concerning for metastatic disease.  Recent hospitalization 11/5-11/03/2021 for acute hypoxic respiratory failure due to SARS COVID-19 viral pneumonia.  He presented on 07/24/2021 with dyspnea was admitted by the hospitalist service and found to have segmental, acute PE.  CT chest revealed multiple pulmonary nodules throughout the lungs and lesions in the liver.  His respiratory status has been progressively worsening, requiring as needed BiPAP and HFNC.  Seen by PCCM for possible biopsy, could not be done due to high risk.  Followed by palliative care team.  Ongoing discussion of goals of care.   Developed hypotension, suspected due to adrenal insufficiency, on p.o. Cortef 25 mg twice daily since 08/18/2021.  08/23/2021: Seen and examined at bedside.  Patient is alert and oriented, he request to see medical oncology, Dr. Lindi Adie consulted.     Assessment & Plan:   Principal Problem:   Acute pulmonary embolus (HCC) Active Problems:   Obstructive sleep apnea   Essential hypertension   CAD, NATIVE VESSEL   Thrombocytopenia (HCC)   Mass of lung   Liver lesion, left lobe   Acute on chronic respiratory failure with hypoxia (HCC)   DM type 2 with diabetic mixed hyperlipidemia (HCC)   Depression   Pressure injury of skin   Chest pain   Palliative care by specialist   Shortness of breath    Single subsegmental pulmonary embolism without acute cor pulmonale (HCC)   Acute deep vein thrombosis (DVT) of distal vein of right lower extremity (HCC)  #1 acute on chronic hypoxemic hypercarbic respiratory failure, secondary to multiple pulmonary nodules/masses, complicated by recent SARS COVID-19 viral pneumonia, acute PE -Patient deemed not stable for biopsy, seen by PCCM Dr. Valeta Harms, but malignancy in differential with concern for metastatic disease to the liver. -Due to patient's respiratory status unable to perform further pulmonary diagnostics for lung nodules and possible underlying malignancy. Worsening respiratory status, currently requiring 15 L high flow nasal cannula and as needed BiPAP to maintain a saturation greater than 90%. Personally reviewed ABG done this morning pH 7.377/66.2/77.5 while on high flow nasal cannula 15 L. Personally reviewed chest x-ray done on 08/13/2021 which showed multiple pulmonary nodules and small bilateral pleural effusions versus atelectasis.  1 dose IV Lasix given 20 mg x 1. Continue to maintain oxygen saturation greater than 90%.  2.  New onset A. fib/acute on chronic diastolic CHF Continue metoprolol for rate control, Eliquis for anticoagulation. -Patient with diffuse coarse breath sounds/crackles noted on examination.  Closely monitor on telemetry.  3.  Type 2 diabetes mellitus/dyslipidemia/hyperglycemia -Hemoglobin A1c 6.2 (07/01/2021). -CBG 153 this morning. -Continue Semglee, SSI. Hyperglycemia likely exacerbated by steroids.  4.  OSA/obesity class I BMI 33. -Outpatient follow-up with PCP.  5.  Thrombocytopenia/worsening anemia of chronic disease Hemoglobin dropped 9.3 from 11.1. Platelet 106 from 115. Continue to monitor  6.  Improving hyponatremia -Serum sodium 134 from 130.  7.  Hepatic lesion in the  left lobe of the liver/splenomegaly seen on CT scan.  8.  Chronic anxiety On Wellbutrin prior to admission. Continue to hold off  benzodiazepine and narcotics to avoid worsening of CO2 retention.  9.  New adrenal insufficiency -Blood pressure improved with hydrocortisone patient noted to have been on steroids during his prolonged hospitalization. -Was on hydrocortisone 50 mg twice daily x3 days, currently on 25 mg twice daily and taper down to hydrocortisone 15 mg every morning and 10 mg every afternoon hopefully in the next 24 to 48 hours. Tapering down Cortef Midodrine added, increased dose to 10 mg 3 times daily. Maintain MAP greater than 65.  10.  Stage II bilateral buttocks pressure ulcer, POA -Continue current wound care. Pressure Injury 07/25/21 Buttocks Right Stage 2 -  Partial thickness loss of dermis presenting as a shallow open injury with a red, pink wound bed without slough. (Active)  07/25/21 1530  Location: Buttocks  Location Orientation: Right  Staging: Stage 2 -  Partial thickness loss of dermis presenting as a shallow open injury with a red, pink wound bed without slough.  Wound Description (Comments):   Present on Admission: Yes     Pressure Injury 07/25/21 Buttocks Left Stage 2 -  Partial thickness loss of dermis presenting as a shallow open injury with a red, pink wound bed without slough. (Active)  07/25/21 1531  Location: Buttocks  Location Orientation: Left  Staging: Stage 2 -  Partial thickness loss of dermis presenting as a shallow open injury with a red, pink wound bed without slough.  Wound Description (Comments):   Present on Admission: Yes     Pressure Injury 08/06/21 Sacrum Medial Deep Tissue Pressure Injury - Purple or maroon localized area of discolored intact skin or blood-filled blister due to damage of underlying soft tissue from pressure and/or shear. (Active)  08/06/21 1600  Location: Sacrum  Location Orientation: Medial  Staging: Deep Tissue Pressure Injury - Purple or maroon localized area of discolored intact skin or blood-filled blister due to damage of underlying soft  tissue from pressure and/or shear.  Wound Description (Comments):   Present on Admission: No    Goals of care DNR Palliative care team following Patient has a very poor prognosis with worsening respiratory failure with concern for multiple lung nodules, liver lesions.   Anxiety/restlessness Continue home bupropion Wean off Xanax as tolerated, monitor for withdrawal  DVT prophylaxis: Eliquis Code Status: DNR Family Communication: Updated his stepdaughter at bedside.    Disposition: Very poor prognosis  Status is: Inpatient  Remains inpatient appropriate because: Severity of illness       Consultants:  PCCM: Dr. Lake Bells 07/29/2021 Palliative care: Dr. Rowe Pavy 08/01/2021 Medical oncology 08/23/2021.  Procedures:  CT angiogram chest 07/24/2021 Chest x-ray 08/14/2021, 08/03/2021, 07/31/2021, 08/23/2021 Abdominal ultrasound 07/29/2021 2D echo 07/25/2021 Lower extremity Dopplers 07/25/2021  Antimicrobials:  Anti-infectives (From admission, onward)    None          Objective: Vitals:   08/23/21 0611 08/23/21 0652 08/23/21 0754 08/23/21 1400  BP: (!) 91/59 112/65  (!) 85/67  Pulse: 71 70  73  Resp: (!) 30 18  20   Temp: 98.7 F (37.1 C) 98.3 F (36.8 C)  98.7 F (37.1 C)  TempSrc:      SpO2: 95% 98% 90% 94%  Weight:      Height:        Intake/Output Summary (Last 24 hours) at 08/23/2021 1531 Last data filed at 08/22/2021 2130 Gross per 24 hour  Intake 3  ml  Output --  Net 3 ml   Filed Weights   07/28/21 0414 08/03/21 1625 08/19/21 0423  Weight: 110 kg 106.2 kg 107.4 kg    Examination:  General exam: Frail-appearing no acute distress.  Alert oriented x3.   Respiratory system: Diffuse rales bilaterally.  Poor respiratory effort.   Cardiovascular system: Regular rate and rhythm no rubs or gallops.   Gastrointestinal system: Soft nontender Normal Bowel Sounds Present.   Central nervous system: Alert moves all 4 extremities.  No focal.   Extremities: No  lower extremity edema bilaterally.   Skin: Pressure wounds as stated above. Psychiatry: Mood is appropriate for condition and setting.    Data Reviewed: I have personally reviewed following labs and imaging studies  CBC: Recent Labs  Lab 08/19/21 0350 08/20/21 0350 08/21/21 0354 08/23/21 0419  WBC 8.2 9.4 6.0 8.5  NEUTROABS  --  5.4  --  5.0  HGB 11.1* 11.1* 9.3* 11.5*  HCT 33.6* 33.1* 27.7* 35.7*  MCV 91.6 90.2 91.1 93.0  PLT 120* 118* 106* 126*    Basic Metabolic Panel: Recent Labs  Lab 08/19/21 0350 08/20/21 0350 08/21/21 0354 08/23/21 0419  NA 134* 130* 134* 135  K 4.4 4.4 3.8 4.0  CL 92* 90* 91* 92*  CO2 34* 32 35* 35*  GLUCOSE 147* 158* 131* 178*  BUN 30* 31* 33* 36*  CREATININE 0.81 0.90 0.88 0.82  CALCIUM 7.8* 7.7* 8.0* 8.2*  MG  --   --   --  2.4  PHOS  --   --   --  3.1    GFR: Estimated Creatinine Clearance: 92.5 mL/min (by C-G formula based on SCr of 0.82 mg/dL).  Liver Function Tests: Recent Labs  Lab 08/23/21 0419  AST 22  ALT 20  ALKPHOS 158*  BILITOT 1.1  PROT 5.8*  ALBUMIN 2.4*    CBG: Recent Labs  Lab 08/22/21 1145 08/22/21 1725 08/22/21 2018 08/23/21 0729 08/23/21 1146  GLUCAP 169* 201* 140* 144* 146*     No results found for this or any previous visit (from the past 240 hour(s)).        Radiology Studies: No results found.      Scheduled Meds:  (feeding supplement) PROSource Plus  30 mL Oral Daily   apixaban  5 mg Oral BID   budesonide (PULMICORT) nebulizer solution  0.5 mg Nebulization BID   buPROPion  150 mg Oral Daily   chlorhexidine  15 mL Mouth Rinse BID   Chlorhexidine Gluconate Cloth  6 each Topical Daily   feeding supplement  237 mL Oral BID BM   hydrocortisone  25 mg Oral BID   insulin aspart  0-15 Units Subcutaneous TID WC   insulin aspart  0-5 Units Subcutaneous QHS   insulin aspart  3 Units Subcutaneous TID WC   insulin glargine-yfgn  15 Units Subcutaneous QHS   ipratropium-albuterol  3 mL  Inhalation BID   loratadine  10 mg Oral Daily   LORazepam  0.5 mg Intravenous Once   mouth rinse  15 mL Mouth Rinse q12n4p   metoprolol tartrate  50 mg Oral BID   midodrine  10 mg Oral BID WC   multivitamin with minerals  1 tablet Oral Daily   oxybutynin  10 mg Oral Daily   pantoprazole  40 mg Oral Daily   polyethylene glycol  17 g Oral Daily   simvastatin  20 mg Oral q1800   sodium chloride flush  3 mL Intravenous Q12H  tamsulosin  0.4 mg Oral BID   Continuous Infusions:     LOS: 30 days    Time spent: 40 minutes    Kayleen Memos, MD [facility]   To contact the attending provider between 7A-7P or the covering provider during after hours 7P-7A, please log into the web site [email or web address] and access using universal [place] password for that web site. If you do not have the password, please call the hospital operator.  08/23/2021, 3:31 PM

## 2021-08-23 NOTE — Progress Notes (Signed)
RED MEWS ongoing, pt with increased RR and decreased Systolic BP, meds started per MD. And Xanax given to help with anxiety.

## 2021-08-23 NOTE — Progress Notes (Addendum)
PT Cancellation Note  Patient Details Name: Jacob Bishop MRN: [id number] DOB: 1940/11/19   Cancelled Treatment:    Reason Eval/Treat Not Completed: Patient declined, no reason specified Tried twice in afternoon , but pt refused initially stating he wanted to eat and had sat up earlier; PT returned after pt ate and he refused due to too tired.  Educated and encouraged pt on importance of mobility , even just sitting side of bed , and that therapy would likely not be able to see him until next week (not over weekend).  He still declined.  Family/friends also tried to encourage but pt just kept shaking head "no".  Encouraged sitting EOB or maxi move to chair with nursing over weekend - notified RN.   Abran Richard, PT Acute Rehab Services Pager [phone] [facility] [phone]   Karlton Lemon 08/23/2021, 3:27 PM

## 2021-08-23 NOTE — Plan of Care (Signed)
  Problem: Education: Goal: Knowledge of General Education information will improve Description Including pain rating scale, medication(s)/side effects and non-pharmacologic comfort measures Outcome: Progressing   Problem: Health Behavior/Discharge Planning: Goal: Ability to manage health-related needs will improve Outcome: Progressing   

## 2021-08-23 NOTE — Progress Notes (Signed)
Called TOC- Myraette McGibboney (Cookie), Case Manager to inform her of Dr. Lindi Adie recommendation per MD request. [facility] team will call Social Worker for Hospice, [facility] notified and work in progress. SRP, RN

## 2021-08-24 DIAGNOSIS — C799 Secondary malignant neoplasm of unspecified site: Secondary | ICD-10-CM

## 2021-08-24 LAB — GLUCOSE, CAPILLARY
Glucose-Capillary: 155 mg/dL — ABNORMAL HIGH (ref 70–99)
Glucose-Capillary: 180 mg/dL — ABNORMAL HIGH (ref 70–99)
Glucose-Capillary: 155 mg/dL — ABNORMAL HIGH (ref 70–99)
Glucose-Capillary: 157 mg/dL — ABNORMAL HIGH (ref 70–99)

## 2021-08-24 NOTE — Progress Notes (Signed)
PT Cancellation Note  Patient Details Name: Jacob Bishop MRN: [id number] DOB: [birth year]   Cancelled Treatment:    Reason Eval/Treat Not Completed: Medical issues which prohibited therapy. Plan is now for residential hospice. Will sign off.    [facility]  Office: [phone] Pager: [phone]

## 2021-08-24 NOTE — Consult Note (Signed)
[facility] CONSULT NOTE  Patient Care Team: [name], NP as PCP - General (Family Medicine) Constance Haw, MD as PCP - Cardiology (Cardiology) Ceasar Mons, MD as Consulting Physician (Urology) Gaynelle Arabian, MD as Consulting Physician (Orthopedic Surgery) Viona Gilmore, [facility] as Pharmacist (Pharmacist)  CHIEF COMPLAINTS/PURPOSE OF CONSULTATION:  Newly diagnosed metastatic carcinoma  HISTORY OF PRESENTING ILLNESS:  Jacob Bishop 80 y.o. male with a past medical history significant for coronary artery disease, diabetes presented to the hospital with acute hypoxic respiratory failure.  Recent COVID-pneumonia.  CT scans revealed bilateral lung nodules that were concerning for metastatic disease.  There were also lesions in the liver.  He developed hypotension from adrenal insufficiency during the hospitalization. Given his extensive comorbidities and poor performance status, I was asked to consult on the oncological issues of bilateral lung nodules and metastatic disease in the setting of her frail patient.  He was not felt to be a good candidate for performing a CT-guided lung biopsy either. Patient was breathing with the BiPAP mask and the family was at his bedside including his wife, daughter and son-in-law.  I reviewed her records extensively and collaborated the history with the patient.  MEDICAL HISTORY:  Past Medical History:  Diagnosis Date   AI (aortic insufficiency) 01/11/2018   Trace, noted on ECHO   Arthritis    CAD in native artery    Nuclear stress test 10/19: EF 58, normal perfusion, low risk   Cellulitis    Dermatitis    Diabetes mellitus    type II   Diastolic dysfunction 39/10/90   Mild, noted on ECHO   Diverticulosis 01/31/2016   ASCENDING COLON AND CECUM, NOTED ON COLONOSCOPY   DOE (dyspnea on exertion)    Edema    Fatty liver 09/18/2004   Noted on [place] ABD   History of kidney stones 05/23/2002   Noted on CT Abd    Hx of colonic polyps 01/31/2016   Hyperlipidemia    Hypertension    Internal hemorrhoids 01/31/2016   Noted on colonoscopy   Obesity    OSA (obstructive sleep apnea)    cpap   Positional vertigo    Pulmonary hypertension ([place]) 01/11/2018   Mild, noted on ECHO   Rhinosinusitis    Skin lesion    TR (tricuspid regurgitation) 01/11/2018   Trace, noted on ECHO    SURGICAL HISTORY: Past Surgical History:  Procedure Laterality Date   CARDIAC CATHETERIZATION     COLONOSCOPY     CORONARY STENT PLACEMENT     3 stents /1997   POLYPECTOMY     TONSILLECTOMY     TOTAL KNEE ARTHROPLASTY Right 10/04/2018   Procedure: RIGHT TOTAL KNEE ARTHROPLASTY;  Surgeon: Gaynelle Arabian, MD;  Location: [facility];  Service: Orthopedics;  Laterality: Right;  Adductor Block    SOCIAL HISTORY: Social History   Socioeconomic History   Marital status: Married    Spouse name: Not on file   Number of children: Not on file   Years of education: Not on file   Highest education level: Not on file  Occupational History   Occupation: OWNER, restaurant    Employer: [employer]  Tobacco Use   Smoking status: Former    Packs/day: 1.00    Years: 20.00    Pack years: 20.00    Types: Cigarettes    Quit date: 01/10/1996    Years since quitting: 25.6   Smokeless tobacco: Never  Vaping Use   Vaping  Use: Never used  Substance and Sexual Activity   Alcohol use: No   Drug use: No   Sexual activity: Not on file  Other Topics Concern   Not on file  Social History Narrative   Grew up in [place]. Mother was [place], Father [place].   He works daily - owns a [place]    Married for 30+ years   Has a daughter who lives in [place] Strain: Not on [employer] Insecurity: No Food Insecurity   Worried About Charity fundraiser in the Last Year: Never true   Arboriculturist in the Last Year: Never true  Transportation  Needs: Not on file  Physical Activity: Inactive   Days of Exercise per Week: 0 days   Minutes of Exercise per Session: 0 min  Stress: No Stress Concern Present   Feeling of Stress : Not at all  Social Connections: Moderately Isolated   Frequency of Communication with Friends and Family: Twice a week   Frequency of Social Gatherings with Friends and Family: Twice a week   Attends Religious Services: Never   Marine scientist or Organizations: No   Attends Music therapist: Never   Marital Status: Married  Human resources officer Violence: Not on file    FAMILY HISTORY: Family History  Problem Relation Age of Onset   Cancer Mother        ? lung cancer   Heart disease Father    Heart attack Father    Heart disease Sister    Diabetes Sister    Heart attack Sister    Heart attack Brother     ALLERGIES:  is allergic to lipitor [atorvastatin calcium] and oxycodone.  MEDICATIONS:  Current Facility-Administered Medications  Medication Dose Route Frequency Provider Last Rate Last Admin   (feeding supplement) PROSource Plus liquid 30 mL  30 mL Oral Daily Elodia Florence., MD   30 mL at 08/23/21 0945   acetaminophen (TYLENOL) tablet 650 mg  650 mg Oral Q6H PRN Vianne Bulls, MD   650 mg at 08/21/21 2148   Or   acetaminophen (TYLENOL) suppository 650 mg  650 mg Rectal Q6H PRN Opyd, Ilene Qua, MD       ALPRAZolam Duanne Moron) tablet 0.25 mg  0.25 mg Oral BID PRN Irene Pap N, DO   0.25 mg at 08/23/21 2122   apixaban (ELIQUIS) tablet 5 mg  5 mg Oral BID Suzzanne Cloud, RPH   5 mg at 08/23/21 2122   budesonide (PULMICORT) nebulizer solution 0.5 mg  0.5 mg Nebulization BID Amin, Ankit Chirag, MD   0.5 mg at 08/24/21 8144   buPROPion (WELLBUTRIN XL) 24 hr tablet 150 mg  150 mg Oral Daily Opyd, Ilene Qua, MD   150 mg at 08/23/21 0944   chlorhexidine (PERIDEX) 0.12 % solution 15 mL  15 mL Mouth Rinse BID Amin, Ankit Chirag, MD   15 mL at 08/23/21 2148   Chlorhexidine Gluconate  Cloth 2 % PADS 6 each  6 each Topical Daily Tawni Millers, MD   6 each at 08/24/21 0625   feeding supplement (ENSURE ENLIVE / ENSURE PLUS) liquid 237 mL  237 mL Oral BID BM Eugenie Filler, MD   237 mL at 08/23/21 1551   guaiFENesin-dextromethorphan (ROBITUSSIN DM) 100-10 MG/5ML syrup 5 mL  5 mL Oral Q4H PRN Elodia Florence., MD  5 mL at 07/29/21 2043   hydrocortisone (CORTEF) tablet 20 mg  20 mg Oral BID Kayleen Memos, DO   20 mg at 08/23/21 2146   insulin aspart (novoLOG) injection 0-15 Units  0-15 Units Subcutaneous TID WC Opyd, Ilene Qua, MD   5 Units at 08/23/21 1812   insulin aspart (novoLOG) injection 0-5 Units  0-5 Units Subcutaneous QHS Vianne Bulls, MD   2 Units at 08/21/21 2151   insulin aspart (novoLOG) injection 3 Units  3 Units Subcutaneous TID WC Opyd, Ilene Qua, MD   3 Units at 08/20/21 1018   insulin glargine-yfgn (SEMGLEE) injection 15 Units  15 Units Subcutaneous QHS Amin, Ankit Chirag, MD   15 Units at 08/23/21 2148   ipratropium-albuterol (DUONEB) 0.5-2.5 (3) MG/3ML nebulizer solution 3 mL  3 mL Inhalation BID Kathie Dike, MD   3 mL at 08/24/21 0835   levalbuterol (XOPENEX) nebulizer solution 0.63 mg  0.63 mg Nebulization Q6H PRN Kathryne Eriksson, NP       lip balm (CARMEX) ointment   Topical PRN Elodia Florence., MD   Given at 08/14/21 [phone]   loratadine (CLARITIN) tablet 10 mg  10 mg Oral Daily Amin, Ankit Chirag, MD   10 mg at 08/23/21 0944   MEDLINE mouth rinse  15 mL Mouth Rinse q12n4p Amin, Ankit Chirag, MD   15 mL at 08/23/21 1804   metoprolol tartrate (LOPRESSOR) tablet 50 mg  50 mg Oral BID Lovey Newcomer T, NP   50 mg at 08/23/21 0944   midodrine (PROAMATINE) tablet 10 mg  10 mg Oral TID WC Irene Pap N, DO       multivitamin with minerals tablet 1 tablet  1 tablet Oral Daily Elodia Florence., MD   1 tablet at 08/23/21 0944   oxybutynin (DITROPAN-XL) 24 hr tablet 10 mg  10 mg Oral Daily Opyd, Ilene Qua, MD   10 mg at 08/23/21 0944    pantoprazole (PROTONIX) EC tablet 40 mg  40 mg Oral Daily Elodia Florence., MD   40 mg at 08/23/21 0944   polyethylene glycol (MIRALAX / GLYCOLAX) packet 17 g  17 g Oral Daily Tawni Millers, MD   17 g at 08/23/21 0944   simvastatin (ZOCOR) tablet 20 mg  20 mg Oral q1800 Opyd, Ilene Qua, MD   20 mg at 08/23/21 1810   sodium chloride flush (NS) 0.9 % injection 3 mL  3 mL Intravenous Q12H Opyd, Ilene Qua, MD   3 mL at 08/23/21 2151   tamsulosin (FLOMAX) capsule 0.4 mg  0.4 mg Oral BID Vianne Bulls, MD   0.4 mg at 08/23/21 2122    REVIEW OF SYSTEMS:   Severe respiratory distress, bedbound  all other systems were reviewed with the patient and are negative.  PHYSICAL EXAMINATION: ECOG PERFORMANCE STATUS: 4 - Bedbound  Vitals:   08/24/21 0556 08/24/21 0835  BP: (!) 93/56   Pulse: 75 79  Resp: 20   Temp: 98.9 F (37.2 C)   SpO2: 94% 94%   Filed Weights   07/28/21 0414 08/03/21 1625 08/19/21 0423  Weight: 242 lb 8.1 oz (110 kg) 234 lb 2.1 oz (106.2 kg) 236 lb 12.4 oz (107.4 kg)   Lungs: Struggling to breathe with the BiPAP mask  LABORATORY DATA:  I have reviewed the data as listed Lab Results  Component Value Date   WBC 8.5 08/23/2021   HGB 11.5 (L) 08/23/2021   HCT 35.7 (L)  08/23/2021   MCV 93.0 08/23/2021   PLT 126 (L) 08/23/2021   Lab Results  Component Value Date   NA 135 08/23/2021   K 4.0 08/23/2021   CL 92 (L) 08/23/2021   CO2 35 (H) 08/23/2021    RADIOGRAPHIC STUDIES: I have personally reviewed the radiological reports and agreed with the findings in the report.  ASSESSMENT AND PLAN:  Metastatic carcinoma: The differential is extremely broad to include carcinomas, sarcomas etc.  I did not recommend obtaining a biopsy in his frail state. It is unlikely that he will be able to receive any systemic treatments. Patient's daughter was inquiring about investigational/experimental treatments. I informed her that even to be able to consider those  options patient needs to be in much better health.  Therefore my recommendation is to pursue hospice..  Patient's wife was in complete agreement and wanted to explore best options for the patient for hospice.   I spent a lot of time reviewing the scans with the family. AFP 21.2, CEA 5.0, CA 19-9 <2  Acute pulmonary embolus (HCC) CT PE with small segmental arterial embolus in RUL (subsegmental and segmental arteries in lower lobes obscured by breathing motion, consolidation/atelectasis), small pleural effusions  I informed the nursing team as well as Dr. Nevada Crane to get hospice involved as quickly as possible so that he can be transitioned.  All questions were answered.  Thank you very much for consulting [place].    Harriette Ohara, MD

## 2021-08-24 NOTE — Plan of Care (Signed)
  Problem: Education: Goal: Knowledge of General Education information will improve Description: Including pain rating scale, medication(s)/side effects and non-pharmacologic comfort measures Outcome: Progressing   Problem: Activity: Goal: Risk for activity intolerance will decrease Outcome: Progressing   Problem: Pain Managment: Goal: General experience of comfort will improve Outcome: Progressing   

## 2021-08-24 NOTE — Progress Notes (Signed)
Daily Progress Note   Patient Name: CORNELLIUS Bishop       Date: 08/24/2021 DOB: 11-25-1940  Age: 80 y.o. MRN#: [id number] Attending Physician: Kayleen Memos, DO Primary Care Physician: [name], NP Admit Date: 07/24/2021  Reason for Consultation/Follow-up: Establishing goals of care  Subjective: I saw and examined Jacob Bishop today.  He was awake and alert and off of BiPAP at time of my encounter.  He reports feeling "so-so" today.  States he is "at peace" overall and wants to focus on feeling well and spending time with family.  Discussed with his stepdaughter and granddaughter outside of the room.  Plan is to work to transition to residential hospice.  They understand that BiPAP would not be continued at residential hospice and we discussed likely natural course will be that he will continue to grow more sleepy and less responsive.  Discussed that hospice facility would be focusing on aggressive symptom management to ensure he is as comfortable as possible.  They understand limited prognosis.  Length of Stay: 31  Current Medications: Scheduled Meds:   (feeding supplement) PROSource Plus  30 mL Oral Daily   apixaban  5 mg Oral BID   budesonide (PULMICORT) nebulizer solution  0.5 mg Nebulization BID   buPROPion  150 mg Oral Daily   chlorhexidine  15 mL Mouth Rinse BID   Chlorhexidine Gluconate Cloth  6 each Topical Daily   feeding supplement  237 mL Oral BID BM   hydrocortisone  20 mg Oral BID   insulin aspart  0-15 Units Subcutaneous TID WC   insulin aspart  0-5 Units Subcutaneous QHS   insulin aspart  3 Units Subcutaneous TID WC   insulin glargine-yfgn  15 Units Subcutaneous QHS   ipratropium-albuterol  3 mL Inhalation BID   loratadine  10 mg Oral Daily   mouth rinse  15 mL Mouth  Rinse q12n4p   metoprolol tartrate  50 mg Oral BID   midodrine  10 mg Oral TID WC   multivitamin with minerals  1 tablet Oral Daily   oxybutynin  10 mg Oral Daily   pantoprazole  40 mg Oral Daily   polyethylene glycol  17 g Oral Daily   simvastatin  20 mg Oral q1800   sodium chloride flush  3 mL Intravenous Q12H   tamsulosin  0.4 mg Oral BID    Continuous Infusions:    PRN Meds: acetaminophen **OR** acetaminophen, ALPRAZolam, guaiFENesin-dextromethorphan, levalbuterol, lip balm  Physical Exam         Awake and alert on on BiPAP  no edema Rate controlled Has venous stasis changes both LE No focal deficits  Vital Signs: BP (!) 145/68 (BP Location: Left Arm)    Pulse 92    Temp 98.9 F (37.2 C) (Oral)    Resp 20    Ht 5\' 11"  (1.803 m)    Wt 107.4 kg    SpO2 94%    BMI 33.02 kg/m  SpO2: SpO2: 94 % O2 Device: O2 Device: Bi-PAP O2 Flow Rate: O2 Flow Rate (L/min): 9 L/min  Intake/output summary:  Intake/Output Summary (Last 24 hours) at 08/24/2021 1314 Last data filed at 08/24/2021 3845 Gross per 24 hour  Intake --  Output 850 ml  Net -850 ml    LBM: Last BM Date: 08/23/21 Baseline Weight: Weight: 110 kg Most recent weight: Weight: 107.4 kg       Palliative Assessment/Data:      Patient Active Problem List   Diagnosis Date Noted   Metastatic carcinoma ([place])    Single subsegmental pulmonary embolism without acute cor pulmonale (HCC)    Acute deep vein thrombosis (DVT) of distal vein of right lower extremity (HCC)    Shortness of breath    Palliative care by specialist    Chest pain 07/27/2021   Depression 07/25/2021   Pressure injury of skin 07/25/2021   Acute pulmonary embolus ([place]) 07/24/2021   Acute on chronic respiratory failure with hypoxia ([place]) 06/30/2021   DM type 2 with diabetic mixed hyperlipidemia ([place]) 06/30/2021   Mass of lung 06/27/2021   Liver lesion, left lobe 06/27/2021   Chronic respiratory failure with hypoxia ([place]) 06/27/2021   OA  (osteoarthritis) of knee 10/04/2018   Other secondary pulmonary hypertension ([place]) 04/06/2018   Osteoarthritis, hand 05/05/2014   Carpal tunnel syndrome 05/02/2014   Eczema 01/23/2014   Thrombocytopenia ([place]) 11/19/2012   Overactive bladder 08/24/2012   Osteoarthritis of right knee 01/22/2012   Benign positional vertigo 07/09/2011   CAD, NATIVE VESSEL 10/22/2009   EDEMA 08/03/2009   Obstructive sleep apnea 05/03/2009   SKIN LESION 09/23/2007   Diabetes mellitus type 2, uncontrolled 03/23/2007   Hyperlipidemia 03/23/2007   Essential hypertension 03/23/2007   COLONIC POLYPS, HX OF 03/23/2007   Morbid obesity ([place]) 03/23/2007    Palliative Care Assessment & Plan   Patient Profile:    Assessment:  Acute PE CT imaging showing multiple pulmonary nodules/masses as well as metastatic lesions in liver.  Has OSA DM, recent COVID-19 infection Functional decline Dyspnea on slight exertion   Recommendations/Plan: DNR/DNI Plan for transition to residential hospice for end-of-life care.  Family understands that BiPAP will not be continued at hospice.  We discussed care that hospice does provide including aggressive symptom management to ensure that he is feeling as well as he can to enjoy what ever time he has left with family.     Code Status Orders  (From admission, onward)           Start     Ordered   07/24/21 2305  Full code  Continuous        07/24/21 2306           Code Status History     Date Active Date Inactive Code Status Order ID Comments User Context   06/30/2021 0055 07/02/2021 2313  Full Code [id number]  Orene Desanctis, DO ED   10/04/2018 1538 10/11/2018 1930 Full Code [id number]  Gaynelle Arabian, MD Inpatient       Prognosis: Likely days to less than 2 weeks  Discharge Planning: Residential hospice when bed is available  Care plan was discussed with patient and family.    Thank you for allowing the Palliative Medicine Team to assist in the care of this  patient.   Total Time 30 Prolonged Time Billed  no    Greater than 50%  of this time was spent counseling and coordinating care related to the above assessment and plan.  Micheline Rough, MD  Please contact Palliative Medicine Team phone at [phone] for questions and concerns.

## 2021-08-24 NOTE — Discharge Summary (Signed)
Discharge Summary  Jacob Bishop WJX:[id number] DOB: 1941-01-19  PCP: [name], NP  Admit date: 07/24/2021 Discharge date: 08/24/2021  Time spent: 35 minutes.  Recommendations for Outpatient Follow-up:  Continue with hospice care.  Discharge Diagnoses:  Active Hospital Problems   Diagnosis Date Noted   Acute pulmonary embolus ([place]) 07/24/2021   Metastatic carcinoma (HCC)    Single subsegmental pulmonary embolism without acute cor pulmonale (HCC)    Acute deep vein thrombosis (DVT) of distal vein of right lower extremity (HCC)    Shortness of breath    Palliative care by specialist    Chest pain 07/27/2021   Depression 07/25/2021   Pressure injury of skin 07/25/2021   Acute on chronic respiratory failure with hypoxia ([place]) 06/30/2021   DM type 2 with diabetic mixed hyperlipidemia ([place]) 06/30/2021   Mass of lung 06/27/2021   Liver lesion, left lobe 06/27/2021   Thrombocytopenia ([place]) 11/19/2012   CAD, NATIVE VESSEL 10/22/2009   Obstructive sleep apnea 05/03/2009   Essential hypertension 03/23/2007    Resolved Hospital Problems  No resolved problems to display.    Diet recommendation: Pleasure feedings.  Vitals:   08/24/21 1143 08/24/21 1340  BP: (!) 145/68   Pulse: 92 88  Resp:    Temp:    SpO2:  100%    History of present illness:  Jacob Bishop was admitted to the hospital with the working diagnosis of acute hypoxemic respiratory failure, in the setting of multiple pulmonary nodules, pulmonary masses complicated by acute pulmonary embolism and recent COVID-19 viral pneumonia.    80 year old male past medical history for coronary artery disease, type 2 diabetes mellitus, and obstructive sleep apnea, recent hospitalization 11/5-11/03/2021 for acute hypoxic respiratory failure due to SARS COVID-19 viral pneumonia.  He presented on 07/24/2021 with dyspnea and hypoxia.  Was admitted by the hospitalist service and found to have segmental, acute PE.  CT chest revealed  multiple pulmonary nodules throughout the lungs and lesions involving the liver.  His respiratory status progressively worsened, requiring as needed BiPAP and HFNC.  Seen by PCCM for possible lung biopsy.  This could not be done due to high risk for complications.  Seen by medical oncology, Dr. Lindi Adie, recommended hospice with comfort care.  During the course of his hospitalization, patient was followed by palliative care team.  Plan is to discharge to hospice residence.  Patient's wife was in complete agreement.   08/24/2021: Patient was seen and examined at his bedside this morning.  There were no acute events overnight.  He has no new complaints.  He is resting comfortably.      Hospital Course:  Principal Problem:   Acute pulmonary embolus (HCC) Active Problems:   Obstructive sleep apnea   Essential hypertension   CAD, NATIVE VESSEL   Thrombocytopenia (HCC)   Mass of lung   Liver lesion, left lobe   Acute on chronic respiratory failure with hypoxia (HCC)   DM type 2 with diabetic mixed hyperlipidemia (HCC)   Depression   Pressure injury of skin   Chest pain   Palliative care by specialist   Shortness of breath   Single subsegmental pulmonary embolism without acute cor pulmonale (HCC)   Acute deep vein thrombosis (DVT) of distal vein of right lower extremity ([place])   Metastatic carcinoma (HCC)  #1 acute on chronic hypoxemic hypercarbic respiratory failure, secondary to multiple pulmonary nodules/masses, complicated by recent SARS COVID-19 viral pneumonia, acute PE -Patient deemed not stable for biopsy, seen by PCCM Dr. Valeta Harms, but  malignancy in differential with concern for metastatic disease to the liver. -Due to patient's respiratory status unable to perform further pulmonary diagnostics for lung nodules and possible underlying malignancy. Worsening respiratory status, currently requiring 15 L high flow nasal cannula and as needed BiPAP to maintain a saturation greater than  90%. Personally reviewed ABG done this morning pH 7.377/66.2/77.5 while on high flow nasal cannula 15 L. Personally reviewed chest x-ray done on 08/13/2021 which showed multiple pulmonary nodules and small bilateral pleural effusions versus atelectasis.  1 dose IV Lasix given 20 mg x 1. Continue to maintain oxygen saturation greater than 90%.  2.  New onset A. fib/acute on chronic diastolic CHF Continue metoprolol for rate control, Eliquis for anticoagulation. -Patient with diffuse coarse breath sounds/crackles noted on examination.  Closely monitor on telemetry.  3.  Type 2 diabetes mellitus/dyslipidemia/hyperglycemia -Hemoglobin A1c 6.2 (07/01/2021). -CBG 153 this morning. -Continue Semglee, SSI. Hyperglycemia likely exacerbated by steroids.  4.  OSA/obesity class I BMI 33. -Outpatient follow-up with PCP.  5.  Thrombocytopenia/worsening anemia of chronic disease Hemoglobin dropped 9.3 from 11.1. Platelet 106 from 115. Continue to monitor  6.  Improving hyponatremia -Serum sodium 134 from 130.  7.  Hepatic lesion in the left lobe of the liver/splenomegaly seen on CT scan.  8.  Chronic anxiety On Wellbutrin prior to admission. Continue to hold off benzodiazepine and narcotics to avoid worsening of CO2 retention.  9.  New adrenal insufficiency -Blood pressure improved with hydrocortisone patient noted to have been on steroids during his prolonged hospitalization. -Was on hydrocortisone 50 mg twice daily x3 days, currently on 25 mg twice daily and taper down to hydrocortisone 15 mg every morning and 10 mg every afternoon hopefully in the next 24 to 48 hours. Tapering down Cortef Midodrine added, increased dose to 10 mg 3 times daily. Maintain MAP greater than 65.  10.  Stage II bilateral buttocks pressure ulcer, POA -Continue current wound care. Pressure Injury 07/25/21 Buttocks Right Stage 2 -  Partial thickness loss of dermis presenting as a shallow open injury with a red,  pink wound bed without slough. (Active)  07/25/21 1530  Location: Buttocks  Location Orientation: Right  Staging: Stage 2 -  Partial thickness loss of dermis presenting as a shallow open injury with a red, pink wound bed without slough.  Wound Description (Comments):   Present on Admission: Yes     Pressure Injury 07/25/21 Buttocks Left Stage 2 -  Partial thickness loss of dermis presenting as a shallow open injury with a red, pink wound bed without slough. (Active)  07/25/21 1531  Location: Buttocks  Location Orientation: Left  Staging: Stage 2 -  Partial thickness loss of dermis presenting as a shallow open injury with a red, pink wound bed without slough.  Wound Description (Comments):   Present on Admission: Yes     Pressure Injury 08/06/21 Sacrum Medial Deep Tissue Pressure Injury - Purple or maroon localized area of discolored intact skin or blood-filled blister due to damage of underlying soft tissue from pressure and/or shear. (Active)  08/06/21 1600  Location: Sacrum  Location Orientation: Medial  Staging: Deep Tissue Pressure Injury - Purple or maroon localized area of discolored intact skin or blood-filled blister due to damage of underlying soft tissue from pressure and/or shear.  Wound Description (Comments):   Present on Admission: No      Goals of care DNR Palliative care team following Patient has a very poor prognosis with worsening respiratory failure with concern for  multiple lung nodules, liver lesions.   Anxiety/restlessness Continue home bupropion Wean off Xanax as tolerated, monitor for withdrawal   DVT prophylaxis: Eliquis Code Status: DNR Family Communication: Updated his stepdaughter at bedside.     Disposition: Very poor prognosis   Status is: Inpatient   Remains inpatient appropriate because: Severity of illness           Consultants:  PCCM: Dr. [place] Bells 07/29/2021 Palliative care: Dr. Rowe Pavy 08/01/2021 Medical oncology 08/23/2021.    Procedures:  CT angiogram chest 07/24/2021 Chest x-ray 08/14/2021, 08/03/2021, 07/31/2021, 08/23/2021 Abdominal ultrasound 07/29/2021 2D echo 07/25/2021 Lower extremity Dopplers 07/25/2021   Antimicrobials:  Anti-infectives (From admission, onward)        None              Discharge Exam: BP (!) 145/68 (BP Location: Left Arm)    Pulse 88    Temp 98.9 F (37.2 C) (Oral)    Resp 20    Ht 5\' 11"  (1.803 m)    Wt 107.4 kg    SpO2 100%    BMI 33.02 kg/m  General: 80 y.o. year-old male well developed well nourished in no acute distress.  Alert and oriented x3. Cardiovascular: Regular rate and rhythm with no rubs or gallops.  No thyromegaly or JVD noted.   Respiratory: Diffuse rales bilaterally.  Poor inspiratory effort.  Abdomen: Soft nontender nondistended with normal bowel sounds x4 quadrants. Musculoskeletal: No lower extremity edema. 2/4 pulses in all 4 extremities. Skin: No ulcerative lesions noted or rashes, Psychiatry: Mood is appropriate for condition and setting  Discharge Instructions You were cared for by a hospitalist during your hospital stay. If you have any questions about your discharge medications or the care you received while you were in the hospital after you are discharged, you can call the unit and asked to speak with the hospitalist on call if the hospitalist that took care of you is not available. Once you are discharged, your primary care physician will handle any further medical issues. Please note that NO REFILLS for any discharge medications will be authorized once you are discharged, as it is imperative that you return to your primary care physician (or establish a relationship with a primary care physician if you do not have one) for your aftercare needs so that they can reassess your need for medications and monitor your lab values.   Allergies as of 08/24/2021       Reactions   Lipitor [atorvastatin Calcium] Other (See Comments)   Muscle soreness    Oxycodone Anxiety   Causes patient to become shaky and dizzy. Unable to tolerate.        Medication List     STOP taking these medications    albuterol 108 (90 Base) MCG/ACT inhaler Commonly known as: VENTOLIN HFA   Basaglar KwikPen 100 UNIT/ML   BD Pen Needle Nano U/F 32G X 4 MM Misc Generic drug: Insulin Pen Needle   buPROPion 150 MG 24 hr tablet Commonly known as: Wellbutrin XL   Carex Coccyx Cushion Misc   meclizine 12.5 MG tablet Commonly known as: ANTIVERT   oxybutynin 10 MG 24 hr tablet Commonly known as: DITROPAN-XL   simvastatin 20 MG tablet Commonly known as: ZOCOR   tamsulosin 0.4 MG Caps capsule Commonly known as: FLOMAX       Allergies  Allergen Reactions   Lipitor [Atorvastatin Calcium] Other (See Comments)    Muscle soreness   Oxycodone Anxiety    Causes patient to become shaky  and dizzy. Unable to tolerate.      The results of significant diagnostics from this hospitalization (including imaging, microbiology, ancillary and laboratory) are listed below for reference.    Significant Diagnostic Studies: DG Chest 1 View  Result Date: 08/14/2021 CLINICAL DATA:  Shortness of breath. EXAM: CHEST  1 VIEW COMPARISON:  Chest x-ray 08/03/2021.  Chest CT 07/24/2021. FINDINGS: Bilateral pulmonary nodular densities are again seen similar to the prior examination. There is new superimposed patchy airspace disease in the bilateral mid and upper lungs. Lung volumes are low. There is no pleural effusion or pneumothorax. Cardiothymic silhouette is stable. Mediastinal silhouette is enlarged likely secondary to adenopathy. No acute fractures are seen. IMPRESSION: 1. Bilateral pulmonary nodular densities are similar to prior. 2. There are new patchy airspace opacities in the mid and upper lungs which may represent pneumonia and or edema. 3. Stable cardiomediastinal silhouette. Electronically Signed   By: Ronney Asters M.D.   On: 08/14/2021 15:51   US Abdomen  Limited  Result Date: 07/29/2021 CLINICAL DATA:  80 year old with multiple lung lesions and concern for neoplastic disease. In addition, there is concern for two hepatic lesions on prior CT imaging. Patient presents for ultrasound-guided liver lesion biopsy. EXAM: ULTRASOUND ABDOMEN LIMITED TECHNIQUE: Pearline Cables scale imaging of the right upper quadrant was performed to evaluate the liver. COMPARISON:  CTA chest 07/24/2021 and CT abdomen 06/24/2021 FINDINGS: Liver was evaluated with ultrasound. Oval shaped hyperechoic structure in the left hepatic lobe that corresponds with the abnormality on the previous CT imaging. This structure measures roughly 2.7 x 1.3 x 1.5 cm. Echogenicity of the lesion is suggestive for a possible hemangioma. No other lesions are confidently identified in the liver. IMPRESSION: 1. Only one hepatic lesion was identified. There is a hyperechoic lesion in left hepatic lobe that may represent a cavernous hemangioma. In addition, percutaneous biopsy of this lesion would be difficult due to location. Recommend liver protocol CT or MRI prior to percutaneous biopsy of this lesion. Would not recommend MRI at this time based on patient's respiratory status. 2. Ultrasound-guided liver biopsy was not performed. Electronically Signed   By: Markus Daft M.D.   On: 07/29/2021 15:18   DG CHEST PORT 1 VIEW  Result Date: 08/21/2021 CLINICAL DATA:  Respiratory failure. EXAM: PORTABLE CHEST 1 VIEW COMPARISON:  Multiple recent chest x-rays and a chest CT from 07/24/2021. FINDINGS: Persistent bilateral pulmonary lesions along with patchy bilateral infiltrates. No definite pleural effusions. No pneumothorax. IMPRESSION: Persistent bilateral pulmonary lesions and patchy bilateral infiltrates. Electronically Signed   By: Marijo Sanes M.D.   On: 08/21/2021 13:59   DG Chest Port 1 View  Result Date: 08/03/2021 CLINICAL DATA:  Short of breath EXAM: PORTABLE CHEST 1 VIEW COMPARISON:  07/31/2021 FINDINGS:  Hypoventilation with bibasilar atelectasis, unchanged. Numerous bilateral pulmonary nodules unchanged. Small pleural effusions. Negative for edema. IMPRESSION: Hypoventilation with bibasilar atelectasis and small effusions unchanged Numerous bilateral pulmonary nodules. Electronically Signed   By: Franchot Gallo M.D.   On: 08/03/2021 14:27   DG Chest Port 1 View  Result Date: 07/31/2021 CLINICAL DATA:  Shortness of breath EXAM: PORTABLE CHEST 1 VIEW COMPARISON:  Previous studies including the examination of 07/27/2021 FINDINGS: Transverse diameter of heart is increased. There is poor inspiration. There are patchy infiltrates and nodular densities in both lungs. There is possible worsening of infiltrate in the left lower lung fields. There is blunting of lateral CP angles. There is no pneumothorax. IMPRESSION: There are patchy infiltrates and discrete nodules in both  lungs. Poor inspiration. There is possible interval worsening of infiltrate in the left lower lung fields. Possible small bilateral pleural effusions. Electronically Signed   By: Elmer Picker M.D.   On: 07/31/2021 11:53   DG CHEST PORT 1 VIEW  Result Date: 07/27/2021 CLINICAL DATA:  Shortness of breath EXAM: PORTABLE CHEST 1 VIEW COMPARISON:  07/26/2021 FINDINGS: Persistent low lung volumes. Bilateral nodular opacities are similar to the prior study. No significant pleural effusion no pneumothorax. Stable cardiomediastinal contours. IMPRESSION: Similar lung aeration with bilateral pulmonary nodules. Electronically Signed   By: Macy Mis M.D.   On: 07/27/2021 09:43   DG CHEST PORT 1 VIEW  Result Date: 07/26/2021 CLINICAL DATA:  Shortness of breath EXAM: PORTABLE CHEST 1 VIEW COMPARISON:  07/24/2021 FINDINGS: Persistent low lung volumes. There are bilateral nodular opacities appear increased. No significant pleural effusion. No pneumothorax. Stable cardiomediastinal contours. IMPRESSION: Bilateral pulmonary nodules appear increased  since prior radiograph. Electronically Signed   By: Macy Mis M.D.   On: 07/26/2021 13:00   ECHOCARDIOGRAM COMPLETE  Result Date: 07/25/2021    ECHOCARDIOGRAM REPORT   Patient Name:   ANEUDY CHAMPLAIN Date of Exam: 07/25/2021 Medical Rec #:  [id number]    Height:       71.0 in Accession #:    [id number]   Weight:       242.5 lb Date of Birth:  Mar 22, 1941    BSA:          2.289 m Patient Age:    80 years     BP:           148/49 mmHg Patient Gender: M            HR:           102 bpm. Exam Location:  Inpatient Procedure: 2D Echo, Color Doppler, Cardiac Doppler and Intracardiac            Opacification Agent Indications:    I26.02 Pulmonary embolus  History:        Patient has no prior history of Echocardiogram examinations.                 CAD.  Sonographer:    Merrie Roof RDCS Referring Phys: [id number] [place]  1. Extremely limited; LV and RV function appear to be normal; doppler suboptimal.  2. Left ventricular ejection fraction, by estimation, is 60 to 65%. The left ventricle has normal function. The left ventricle has no regional wall motion abnormalities. Left ventricular diastolic function could not be evaluated.  3. Right ventricular systolic function is normal. The right ventricular size is normal.  4. The mitral valve is normal in structure. No evidence of mitral valve regurgitation.  5. The aortic valve was not well visualized. Aortic valve regurgitation not well assessed.  6. Pulmonic valve regurgitation not assessed.  7. The inferior vena cava is normal in size with greater than 50% respiratory variability, suggesting right atrial pressure of 3 mmHg. Comparison(s): No prior Echocardiogram. FINDINGS  Left Ventricle: Left ventricular ejection fraction, by estimation, is 60 to 65%. The left ventricle has normal function. The left ventricle has no regional wall motion abnormalities. Definity contrast agent was given IV to delineate the left ventricular  endocardial borders. The left  ventricular internal cavity size was normal in size. Suboptimal image quality limits for assessment of left ventricular hypertrophy. Left ventricular diastolic function could not be evaluated. Right Ventricle: The right ventricular size is normal. Right vetricular wall thickness was  not well visualized. Right ventricular systolic function is normal. Left Atrium: Left atrial size was normal in size. Right Atrium: Right atrial size was normal in size. Pericardium: There is no evidence of pericardial effusion. Mitral Valve: The mitral valve is normal in structure. No evidence of mitral valve regurgitation. Tricuspid Valve: The tricuspid valve is normal in structure. Tricuspid valve regurgitation is not demonstrated. Aortic Valve: The aortic valve was not well visualized. Aortic valve regurgitation not well assessed. Pulmonic Valve: The pulmonic valve was not assessed. Pulmonic valve regurgitation not assessed. Aorta: The aortic root was not well visualized. Venous: The inferior vena cava was not well visualized. The inferior vena cava is normal in size with greater than 50% respiratory variability, suggesting right atrial pressure of 3 mmHg. IAS/Shunts: The interatrial septum was not well visualized. Additional Comments: Extremely limited; LV and RV function appear to be normal; doppler suboptimal.  LEFT VENTRICLE PLAX 2D LVOT diam:     2.20 cm LV SV:         64 LV SV Index:   28 LVOT Area:     3.80 cm  LEFT ATRIUM           Index LA Vol (A4C): 95.9 ml 41.90 ml/m  AORTIC VALVE LVOT Vmax:   112.00 cm/s LVOT Vmean:  67.500 cm/s LVOT VTI:    0.168 m  SHUNTS Systemic VTI:  0.17 m Systemic Diam: 2.20 cm Kirk Ruths MD Electronically signed by Kirk Ruths MD Signature Date/Time: 07/25/2021/6:32:27 PM    Final     Microbiology: No results found for this or any previous visit (from the past 240 hour(s)).   Labs: Basic Metabolic Panel: Recent Labs  Lab 08/19/21 0350 08/20/21 0350 08/21/21 0354 08/23/21 0419   NA 134* 130* 134* 135  K 4.4 4.4 3.8 4.0  CL 92* 90* 91* 92*  CO2 34* 32 35* 35*  GLUCOSE 147* 158* 131* 178*  BUN 30* 31* 33* 36*  CREATININE 0.81 0.90 0.88 0.82  CALCIUM 7.8* 7.7* 8.0* 8.2*  MG  --   --   --  2.4  PHOS  --   --   --  3.1   Liver Function Tests: Recent Labs  Lab 08/23/21 0419  AST 22  ALT 20  ALKPHOS 158*  BILITOT 1.1  PROT 5.8*  ALBUMIN 2.4*   No results for input(s): LIPASE, AMYLASE in the last 168 hours. No results for input(s): AMMONIA in the last 168 hours. CBC: Recent Labs  Lab 08/19/21 0350 08/20/21 0350 08/21/21 0354 08/23/21 0419  WBC 8.2 9.4 6.0 8.5  NEUTROABS  --  5.4  --  5.0  HGB 11.1* 11.1* 9.3* 11.5*  HCT 33.6* 33.1* 27.7* 35.7*  MCV 91.6 90.2 91.1 93.0  PLT 120* 118* 106* 126*   Cardiac Enzymes: No results for input(s): CKTOTAL, CKMB, CKMBINDEX, TROPONINI in the last 168 hours. BNP: BNP (last 3 results) Recent Labs    07/29/21 0409 08/02/21 0445 08/21/21 1342  BNP 54.3 18.8 68.0    ProBNP (last 3 results) No results for input(s): PROBNP in the last 8760 hours.  CBG: Recent Labs  Lab 08/23/21 1146 08/23/21 1756 08/23/21 2041 08/24/21 0723 08/24/21 1255  GLUCAP 146* 212* 163* 155* 180*       Signed:  Kayleen Memos, MD [facility] 08/24/2021, 1:44 PM

## 2021-08-24 NOTE — Progress Notes (Signed)
Occupational Therapy Discharge Patient Details Name: Jacob Bishop MRN: [id number] DOB: 18-Feb-1941 Today's Date: 08/24/2021 Time:  -     Patient discharged from OT services secondary to medical decline - will need to re-order OT to resume therapy services.  Please see latest therapy progress note for current level of functioning and progress toward goals.    Progress and discharge plan discussed with patient and/or caregiver: Patient unable to participate in discharge planning and no caregivers available  Family is pursuing residential hospice. OT will sign off. Please re-order therapy if GOC change AND patient medically appropriate for therapy.     [name] [name] 08/24/2021, 7:33 AM

## 2021-08-24 NOTE — TOC Progression Note (Addendum)
Transition of Care [facility]) - Progression Note    Patient Details  Name: Jacob Bishop MRN: [id number] Date of Birth: 30-May-1941  [facility]) CM/SW Contact  [name], RN Phone Number: 08/24/2021, 9:38 AM  Clinical Narrative:    Tcf-Pat with hospice home patient can not transport to hospice house[place]/bed might be available later today [facility] does have bed. Patient must be off bipap to go to residential hospice. per pat with hospice house in [place] no room available until tomorrow.  [facility] is [place] so can not use it. Pat with hospice home is [phone]. Expected Discharge Plan: [place] Barriers to Discharge: No Barriers Identified  Expected Discharge Plan and Services Expected Discharge Plan: [place] arrangements for the past 2 months: Single Family Home                                       Social Determinants of Health (SDOH) Interventions    Readmission Risk Interventions No flowsheet data found.

## 2021-08-25 LAB — GLUCOSE, CAPILLARY: Glucose-Capillary: 151 mg/dL — ABNORMAL HIGH (ref 70–99)

## 2021-08-25 MED ORDER — ALPRAZOLAM 0.25 MG PO TABS
0.2500 mg | ORAL_TABLET | Freq: Three times a day (TID) | ORAL | Status: DC | PRN
  Administered 2021-08-25: 0.25 mg via ORAL
  Filled 2021-08-25: qty 1

## 2021-08-25 NOTE — TOC Transition Note (Signed)
Transition of Care [facility]) - CM/SW Discharge Note   Patient Details  Name: Jacob Bishop MRN: [id number] Date of Birth: 01/13/1941  Transition of [facility]) CM/SW Contact:  Ross Ludwig, LCSW Phone Number: 08/25/2021, 9:54 AM   Clinical Narrative:     CSW was informed by [facility] that a bed is available today for [facility].  CSW updated attending physician, bedside nurse, and wife.  Patient to be d/c'ed today to [facility].  Patient and family agreeable to plans will transport via ems RN to call report to [phone].  Patient's wife is aware that transport has been arranged, and patient is discharging today.   Final next level of care: [place] Barriers to Discharge: Barriers Resolved   Patient Goals and CMS Choice Patient states their goals for this hospitalization and ongoing recovery are:: To go to [facility] for end of life care. CMS Medicare.gov Compare Post Acute Care list provided to:: Patient Represenative (must comment) Choice offered to / list presented to : Spouse  Discharge Placement              Patient chooses bed at: Other - please specify in the comment section below: [facility] [place]) Patient to be transferred to facility by: [facility] Name of family member notified: Wife Jacob Bishop [phone] Patient and family notified of of transfer: 08/25/21  Discharge Plan and Services                                     Social Determinants of Health (SDOH) Interventions     Readmission Risk Interventions No flowsheet data found.

## 2021-08-25 NOTE — Discharge Summary (Signed)
Discharge Summary  Jacob Bishop KGM:[id number] DOB: 06/03/1941  PCP: [name], NP  Admit date: 07/24/2021 Discharge date: 08/25/2021  Time spent: 35 minutes.  Recommendations for Outpatient Follow-up:  Continue with hospice care.  Discharge Diagnoses:  Active Hospital Problems   Diagnosis Date Noted   Acute pulmonary embolus ([place]) 07/24/2021   Metastatic carcinoma (HCC)    Single subsegmental pulmonary embolism without acute cor pulmonale (HCC)    Acute deep vein thrombosis (DVT) of distal vein of right lower extremity (HCC)    Shortness of breath    Palliative care by specialist    Chest pain 07/27/2021   Depression 07/25/2021   Pressure injury of skin 07/25/2021   Acute on chronic respiratory failure with hypoxia ([place]) 06/30/2021   DM type 2 with diabetic mixed hyperlipidemia ([place]) 06/30/2021   Mass of lung 06/27/2021   Liver lesion, left lobe 06/27/2021   Thrombocytopenia ([place]) 11/19/2012   CAD, NATIVE VESSEL 10/22/2009   Obstructive sleep apnea 05/03/2009   Essential hypertension 03/23/2007    Resolved Hospital Problems  No resolved problems to display.    Diet recommendation: Pleasure feedings.  Vitals:   08/24/21 2340 08/25/21 0539  BP:  (!) 101/58  Pulse: 84 73  Resp: 20 18  Temp:  98 F (36.7 C)  SpO2: 100% 95%    History of present illness:  Mr. Jacob Bishop was admitted to the hospital with the working diagnosis of acute hypoxemic respiratory failure, in the setting of multiple pulmonary nodules, pulmonary masses complicated by acute pulmonary embolism and recent COVID-19 viral pneumonia.    81 year old male past medical history for coronary artery disease, type 2 diabetes mellitus, and obstructive sleep apnea, recent hospitalization 11/5-11/03/2021 for acute hypoxic respiratory failure due to SARS COVID-19 viral pneumonia.  He presented on 07/24/2021 with dyspnea and hypoxia.  Was admitted by the hospitalist service and found to have segmental, acute PE.   CT chest revealed multiple pulmonary nodules throughout the lungs and lesions involving the liver.  His respiratory status progressively worsened, requiring as needed BiPAP and HFNC.  Seen by PCCM for possible lung biopsy.  This could not be done due to high risk for complications.  Seen by medical oncology, Dr. Lindi Adie, recommended hospice with comfort care.  During the course of his hospitalization, patient was followed by palliative care team.  Plan is to discharge to hospice residence.  Patient's wife was in complete agreement.   08/24/2021: Patient was seen and examined at his bedside this morning.  There were no acute events overnight.  He has no new complaints.  He is resting comfortably.      Hospital Course:  Principal Problem:   Acute pulmonary embolus (HCC) Active Problems:   Obstructive sleep apnea   Essential hypertension   CAD, NATIVE VESSEL   Thrombocytopenia (HCC)   Mass of lung   Liver lesion, left lobe   Acute on chronic respiratory failure with hypoxia (HCC)   DM type 2 with diabetic mixed hyperlipidemia (HCC)   Depression   Pressure injury of skin   Chest pain   Palliative care by specialist   Shortness of breath   Single subsegmental pulmonary embolism without acute cor pulmonale (HCC)   Acute deep vein thrombosis (DVT) of distal vein of right lower extremity ([place])   Metastatic carcinoma ([place])  #1 acute on chronic hypoxemic hypercarbic respiratory failure, secondary to multiple pulmonary nodules/masses, complicated by recent SARS COVID-19 viral pneumonia, acute PE -Patient deemed not stable for biopsy, seen by [facility]  Dr. Valeta Harms, but malignancy in differential with concern for metastatic disease to the liver. -Due to patient's respiratory status unable to perform further pulmonary diagnostics for lung nodules and possible underlying malignancy. Worsening respiratory status, currently requiring 15 L high flow nasal cannula and as needed BiPAP to maintain a saturation  greater than 90%. Personally reviewed ABG done this morning pH 7.377/66.2/77.5 while on high flow nasal cannula 15 L. Personally reviewed chest x-ray done on 08/13/2021 which showed multiple pulmonary nodules and small bilateral pleural effusions versus atelectasis.  1 dose IV Lasix given 20 mg x 1. Continue to maintain oxygen saturation greater than 90%.  2.  New onset A. fib/acute on chronic diastolic CHF Continue metoprolol for rate control, Eliquis for anticoagulation. -Patient with diffuse coarse breath sounds/crackles noted on examination.  Closely monitor on telemetry.  3.  Type 2 diabetes mellitus/dyslipidemia/hyperglycemia -Hemoglobin A1c 6.2 (07/01/2021). -CBG 153 this morning. -Continue Semglee, SSI. Hyperglycemia likely exacerbated by steroids.  4.  OSA/obesity class I BMI 33. -Outpatient follow-up with PCP.  5.  Thrombocytopenia/worsening anemia of chronic disease Hemoglobin dropped 9.3 from 11.1. Platelet 106 from 115. Continue to monitor  6.  Improving hyponatremia -Serum sodium 134 from 130.  7.  Hepatic lesion in the left lobe of the liver/splenomegaly seen on CT scan.  8.  Chronic anxiety On Wellbutrin prior to admission. Continue to hold off benzodiazepine and narcotics to avoid worsening of CO2 retention.  9.  New adrenal insufficiency -Blood pressure improved with hydrocortisone patient noted to have been on steroids during his prolonged hospitalization. -Was on hydrocortisone 50 mg twice daily x3 days, currently on 25 mg twice daily and taper down to hydrocortisone 15 mg every morning and 10 mg every afternoon hopefully in the next 24 to 48 hours. Tapering down Cortef Midodrine added, increased dose to 10 mg 3 times daily. Maintain MAP greater than 65.  10.  Stage II bilateral buttocks pressure ulcer, POA -Continue current wound care. Pressure Injury 07/25/21 Buttocks Right Stage 2 -  Partial thickness loss of dermis presenting as a shallow open injury  with a red, pink wound bed without slough. (Active)  07/25/21 1530  Location: Buttocks  Location Orientation: Right  Staging: Stage 2 -  Partial thickness loss of dermis presenting as a shallow open injury with a red, pink wound bed without slough.  Wound Description (Comments):   Present on Admission: Yes     Pressure Injury 07/25/21 Buttocks Left Stage 2 -  Partial thickness loss of dermis presenting as a shallow open injury with a red, pink wound bed without slough. (Active)  07/25/21 1531  Location: Buttocks  Location Orientation: Left  Staging: Stage 2 -  Partial thickness loss of dermis presenting as a shallow open injury with a red, pink wound bed without slough.  Wound Description (Comments):   Present on Admission: Yes     Pressure Injury 08/06/21 Sacrum Medial Deep Tissue Pressure Injury - Purple or maroon localized area of discolored intact skin or blood-filled blister due to damage of underlying soft tissue from pressure and/or shear. (Active)  08/06/21 1600  Location: Sacrum  Location Orientation: Medial  Staging: Deep Tissue Pressure Injury - Purple or maroon localized area of discolored intact skin or blood-filled blister due to damage of underlying soft tissue from pressure and/or shear.  Wound Description (Comments):   Present on Admission: No      Goals of care DNR Palliative care team following Patient has a very poor prognosis with worsening respiratory failure  with concern for multiple lung nodules, liver lesions.   Anxiety/restlessness Continue home bupropion Wean off Xanax as tolerated, monitor for withdrawal   DVT prophylaxis: Eliquis Code Status: DNR Family Communication: Updated his stepdaughter at bedside.     Disposition: Very poor prognosis   Status is: Inpatient   Remains inpatient appropriate because: Severity of illness           Consultants:  PCCM: Dr. Lake Bells 07/29/2021 Palliative care: Dr. Rowe Pavy 08/01/2021 Medical oncology  08/23/2021.   Procedures:  CT angiogram chest 07/24/2021 Chest x-ray 08/14/2021, 08/03/2021, 07/31/2021, 08/23/2021 Abdominal ultrasound 07/29/2021 2D echo 07/25/2021 Lower extremity Dopplers 07/25/2021   Antimicrobials:  Anti-infectives (From admission, onward)        None              Discharge Exam: BP (!) 101/58 (BP Location: Right Arm)    Pulse 73    Temp 98 F (36.7 C) (Oral)    Resp 18    Ht 5\' 11"  (1.803 m)    Wt 107.4 kg    SpO2 95%    BMI 33.02 kg/m  General: 81 y.o. year-old male well developed well nourished in no acute distress.  Alert and oriented x3. Cardiovascular: Regular rate and rhythm with no rubs or gallops.  No thyromegaly or JVD noted.   Respiratory: Diffuse rales bilaterally.  Poor inspiratory effort.  Abdomen: Soft nontender nondistended with normal bowel sounds x4 quadrants. Musculoskeletal: No lower extremity edema. 2/4 pulses in all 4 extremities. Skin: No ulcerative lesions noted or rashes, Psychiatry: Mood is appropriate for condition and setting  Discharge Instructions You were cared for by a hospitalist during your hospital stay. If you have any questions about your discharge medications or the care you received while you were in the hospital after you are discharged, you can call the unit and asked to speak with the hospitalist on call if the hospitalist that took care of you is not available. Once you are discharged, your primary care physician will handle any further medical issues. Please note that NO REFILLS for any discharge medications will be authorized once you are discharged, as it is imperative that you return to your primary care physician (or establish a relationship with a primary care physician if you do not have one) for your aftercare needs so that they can reassess your need for medications and monitor your lab values.   Allergies as of 08/25/2021       Reactions   Lipitor [atorvastatin Calcium] Other (See Comments)   Muscle soreness    Oxycodone Anxiety   Causes patient to become shaky and dizzy. Unable to tolerate.        Medication List     STOP taking these medications    albuterol 108 (90 Base) MCG/ACT inhaler Commonly known as: VENTOLIN HFA   Basaglar KwikPen 100 UNIT/ML   BD Pen Needle Nano U/F 32G X 4 MM Misc Generic drug: Insulin Pen Needle   buPROPion 150 MG 24 hr tablet Commonly known as: Wellbutrin XL   Carex Coccyx Cushion Misc   meclizine 12.5 MG tablet Commonly known as: ANTIVERT   oxybutynin 10 MG 24 hr tablet Commonly known as: DITROPAN-XL   simvastatin 20 MG tablet Commonly known as: ZOCOR   tamsulosin 0.4 MG Caps capsule Commonly known as: FLOMAX       Allergies  Allergen Reactions   Lipitor [Atorvastatin Calcium] Other (See Comments)    Muscle soreness   Oxycodone Anxiety    Causes patient  to become shaky and dizzy. Unable to tolerate.      The results of significant diagnostics from this hospitalization (including imaging, microbiology, ancillary and laboratory) are listed below for reference.    Significant Diagnostic Studies: DG Chest 1 View  Result Date: 08/14/2021 CLINICAL DATA:  Shortness of breath. EXAM: CHEST  1 VIEW COMPARISON:  Chest x-ray 08/03/2021.  Chest CT 07/24/2021. FINDINGS: Bilateral pulmonary nodular densities are again seen similar to the prior examination. There is new superimposed patchy airspace disease in the bilateral mid and upper lungs. Lung volumes are low. There is no pleural effusion or pneumothorax. Cardiothymic silhouette is stable. Mediastinal silhouette is enlarged likely secondary to adenopathy. No acute fractures are seen. IMPRESSION: 1. Bilateral pulmonary nodular densities are similar to prior. 2. There are new patchy airspace opacities in the mid and upper lungs which may represent pneumonia and or edema. 3. Stable cardiomediastinal silhouette. Electronically Signed   By: Ronney Asters M.D.   On: 08/14/2021 15:51   US Abdomen  Limited  Result Date: 07/29/2021 CLINICAL DATA:  81 year old with multiple lung lesions and concern for neoplastic disease. In addition, there is concern for two hepatic lesions on prior CT imaging. Patient presents for ultrasound-guided liver lesion biopsy. EXAM: ULTRASOUND ABDOMEN LIMITED TECHNIQUE: Pearline Cables scale imaging of the right upper quadrant was performed to evaluate the liver. COMPARISON:  CTA chest 07/24/2021 and CT abdomen 06/24/2021 FINDINGS: Liver was evaluated with ultrasound. Oval shaped hyperechoic structure in the left hepatic lobe that corresponds with the abnormality on the previous CT imaging. This structure measures roughly 2.7 x 1.3 x 1.5 cm. Echogenicity of the lesion is suggestive for a possible hemangioma. No other lesions are confidently identified in the liver. IMPRESSION: 1. Only one hepatic lesion was identified. There is a hyperechoic lesion in left hepatic lobe that may represent a cavernous hemangioma. In addition, percutaneous biopsy of this lesion would be difficult due to location. Recommend liver protocol CT or MRI prior to percutaneous biopsy of this lesion. Would not recommend MRI at this time based on patient's respiratory status. 2. Ultrasound-guided liver biopsy was not performed. Electronically Signed   By: Markus Daft M.D.   On: 07/29/2021 15:18   DG CHEST PORT 1 VIEW  Result Date: 08/21/2021 CLINICAL DATA:  Respiratory failure. EXAM: PORTABLE CHEST 1 VIEW COMPARISON:  Multiple recent chest x-rays and a chest CT from 07/24/2021. FINDINGS: Persistent bilateral pulmonary lesions along with patchy bilateral infiltrates. No definite pleural effusions. No pneumothorax. IMPRESSION: Persistent bilateral pulmonary lesions and patchy bilateral infiltrates. Electronically Signed   By: Marijo Sanes M.D.   On: 08/21/2021 13:59   DG Chest Port 1 View  Result Date: 08/03/2021 CLINICAL DATA:  Short of breath EXAM: PORTABLE CHEST 1 VIEW COMPARISON:  07/31/2021 FINDINGS:  Hypoventilation with bibasilar atelectasis, unchanged. Numerous bilateral pulmonary nodules unchanged. Small pleural effusions. Negative for edema. IMPRESSION: Hypoventilation with bibasilar atelectasis and small effusions unchanged Numerous bilateral pulmonary nodules. Electronically Signed   By: Franchot Gallo M.D.   On: 08/03/2021 14:27   DG Chest Port 1 View  Result Date: 07/31/2021 CLINICAL DATA:  Shortness of breath EXAM: PORTABLE CHEST 1 VIEW COMPARISON:  Previous studies including the examination of 07/27/2021 FINDINGS: Transverse diameter of heart is increased. There is poor inspiration. There are patchy infiltrates and nodular densities in both lungs. There is possible worsening of infiltrate in the left lower lung fields. There is blunting of lateral CP angles. There is no pneumothorax. IMPRESSION: There are patchy infiltrates and discrete  nodules in both lungs. Poor inspiration. There is possible interval worsening of infiltrate in the left lower lung fields. Possible small bilateral pleural effusions. Electronically Signed   By: Elmer Picker M.D.   On: 07/31/2021 11:53   DG CHEST PORT 1 VIEW  Result Date: 07/27/2021 CLINICAL DATA:  Shortness of breath EXAM: PORTABLE CHEST 1 VIEW COMPARISON:  07/26/2021 FINDINGS: Persistent low lung volumes. Bilateral nodular opacities are similar to the prior study. No significant pleural effusion no pneumothorax. Stable cardiomediastinal contours. IMPRESSION: Similar lung aeration with bilateral pulmonary nodules. Electronically Signed   By: Macy Mis M.D.   On: 07/27/2021 09:43   DG CHEST PORT 1 VIEW  Result Date: 07/26/2021 CLINICAL DATA:  Shortness of breath EXAM: PORTABLE CHEST 1 VIEW COMPARISON:  07/24/2021 FINDINGS: Persistent low lung volumes. There are bilateral nodular opacities appear increased. No significant pleural effusion. No pneumothorax. Stable cardiomediastinal contours. IMPRESSION: Bilateral pulmonary nodules appear increased  since prior radiograph. Electronically Signed   By: Macy Mis M.D.   On: 07/26/2021 13:00    Microbiology: No results found for this or any previous visit (from the past 240 hour(s)).   Labs: Basic Metabolic Panel: Recent Labs  Lab 08/19/21 0350 08/20/21 0350 08/21/21 0354 08/23/21 0419  NA 134* 130* 134* 135  K 4.4 4.4 3.8 4.0  CL 92* 90* 91* 92*  CO2 34* 32 35* 35*  GLUCOSE 147* 158* 131* 178*  BUN 30* 31* 33* 36*  CREATININE 0.81 0.90 0.88 0.82  CALCIUM 7.8* 7.7* 8.0* 8.2*  MG  --   --   --  2.4  PHOS  --   --   --  3.1   Liver Function Tests: Recent Labs  Lab 08/23/21 0419  AST 22  ALT 20  ALKPHOS 158*  BILITOT 1.1  PROT 5.8*  ALBUMIN 2.4*   No results for input(s): LIPASE, AMYLASE in the last 168 hours. No results for input(s): AMMONIA in the last 168 hours. CBC: Recent Labs  Lab 08/19/21 0350 08/20/21 0350 08/21/21 0354 08/23/21 0419  WBC 8.2 9.4 6.0 8.5  NEUTROABS  --  5.4  --  5.0  HGB 11.1* 11.1* 9.3* 11.5*  HCT 33.6* 33.1* 27.7* 35.7*  MCV 91.6 90.2 91.1 93.0  PLT 120* 118* 106* 126*   Cardiac Enzymes: No results for input(s): CKTOTAL, CKMB, CKMBINDEX, TROPONINI in the last 168 hours. BNP: BNP (last 3 results) Recent Labs    07/29/21 0409 08/02/21 0445 08/21/21 1342  BNP 54.3 18.8 68.0    ProBNP (last 3 results) No results for input(s): PROBNP in the last 8760 hours.  CBG: Recent Labs  Lab 08/23/21 2041 08/24/21 0723 08/24/21 1255 08/24/21 1653 08/24/21 2145  GLUCAP 163* 155* 180* 155* 157*       Signed:  Kayleen Memos, MD [facility] 08/25/2021, 7:38 AM

## 2021-08-30 ENCOUNTER — Telehealth: Payer: Self-pay | Admitting: Pharmacist

## 2021-08-30 NOTE — Progress Notes (Signed)
error 

## 2021-09-10 ENCOUNTER — Telehealth: Payer: Self-pay | Admitting: Adult Health

## 2021-09-10 NOTE — Telephone Encounter (Signed)
Received notification from Hospice that patient passed away on 08-31-21

## 2021-09-25 DEATH — deceased

## 2021-09-30 ENCOUNTER — Telehealth: Payer: Medicare Other

## 2021-10-11 ENCOUNTER — Telehealth: Payer: Medicare Other
